# Patient Record
Sex: Male | Born: 1955
Health system: Southern US, Community
[De-identification: ages and names within clinical notes are randomized; demographics above are authoritative.]

## PROBLEM LIST (undated history)

## (undated) DIAGNOSIS — I10 Essential (primary) hypertension: Secondary | ICD-10-CM

## (undated) DIAGNOSIS — E119 Type 2 diabetes mellitus without complications: Secondary | ICD-10-CM

## (undated) HISTORY — PX: BRAIN SURGERY: SHX531

---

## 1998-02-25 ENCOUNTER — Inpatient Hospital Stay (HOSPITAL_COMMUNITY): Admission: EM | Admit: 1998-02-25 | Discharge: 1998-02-26 | Payer: Self-pay | Admitting: *Deleted

## 1999-12-15 ENCOUNTER — Emergency Department (HOSPITAL_COMMUNITY): Admission: EM | Admit: 1999-12-15 | Discharge: 1999-12-15 | Payer: Self-pay | Admitting: Emergency Medicine

## 2000-01-26 ENCOUNTER — Encounter: Payer: Self-pay | Admitting: Emergency Medicine

## 2000-01-26 ENCOUNTER — Emergency Department (HOSPITAL_COMMUNITY): Admission: EM | Admit: 2000-01-26 | Discharge: 2000-01-26 | Payer: Self-pay | Admitting: Emergency Medicine

## 2000-01-27 ENCOUNTER — Encounter: Payer: Self-pay | Admitting: Emergency Medicine

## 2000-02-20 ENCOUNTER — Encounter: Admission: RE | Admit: 2000-02-20 | Discharge: 2000-02-20 | Payer: Self-pay | Admitting: Internal Medicine

## 2006-02-22 ENCOUNTER — Inpatient Hospital Stay (HOSPITAL_COMMUNITY): Admission: EM | Admit: 2006-02-22 | Discharge: 2006-03-09 | Payer: Self-pay | Admitting: Emergency Medicine

## 2006-02-22 ENCOUNTER — Ambulatory Visit: Payer: Self-pay | Admitting: Acute Care

## 2006-03-01 ENCOUNTER — Ambulatory Visit: Payer: Self-pay | Admitting: Physical Medicine & Rehabilitation

## 2006-03-19 ENCOUNTER — Encounter: Admission: RE | Admit: 2006-03-19 | Discharge: 2006-03-19 | Payer: Self-pay | Admitting: Neurological Surgery

## 2009-09-17 ENCOUNTER — Encounter: Admission: RE | Admit: 2009-09-17 | Discharge: 2009-09-17 | Payer: Self-pay | Admitting: Family Medicine

## 2010-06-15 ENCOUNTER — Emergency Department (HOSPITAL_COMMUNITY): Admission: EM | Admit: 2010-06-15 | Discharge: 2010-06-16 | Payer: Self-pay | Admitting: Emergency Medicine

## 2010-10-25 LAB — DIFFERENTIAL
Basophils Absolute: 0 K/uL (ref 0.0–0.1)
Basophils Relative: 0 % (ref 0–1)
Eosinophils Absolute: 0.2 K/uL (ref 0.0–0.7)
Eosinophils Relative: 2 % (ref 0–5)
Lymphocytes Relative: 29 % (ref 12–46)
Lymphs Abs: 2.5 K/uL (ref 0.7–4.0)
Monocytes Absolute: 0.7 K/uL (ref 0.1–1.0)
Monocytes Relative: 8 % (ref 3–12)
Neutro Abs: 5.2 K/uL (ref 1.7–7.7)
Neutrophils Relative %: 61 % (ref 43–77)

## 2010-10-25 LAB — COMPREHENSIVE METABOLIC PANEL WITH GFR
ALT: 38 U/L (ref 0–53)
AST: 30 U/L (ref 0–37)
Albumin: 4 g/dL (ref 3.5–5.2)
Alkaline Phosphatase: 37 U/L — ABNORMAL LOW (ref 39–117)
BUN: 15 mg/dL (ref 6–23)
CO2: 26 meq/L (ref 19–32)
Calcium: 9.8 mg/dL (ref 8.4–10.5)
Chloride: 102 meq/L (ref 96–112)
Creatinine, Ser: 1.08 mg/dL (ref 0.4–1.5)
GFR calc Af Amer: >60 mL/min (ref >60)
GFR calc non Af Amer: >60 mL/min (ref >60)
Glucose, Bld: 117 mg/dL — ABNORMAL HIGH (ref 70–99)
Potassium: 3.8 meq/L (ref 3.5–5.1)
Sodium: 136 meq/L (ref 135–145)
Total Bilirubin: 0.3 mg/dL (ref 0.3–1.2)
Total Protein: 7.7 g/dL (ref 6.0–8.3)

## 2010-10-25 LAB — URINALYSIS, ROUTINE W REFLEX MICROSCOPIC
Bilirubin Urine: NEGATIVE
Glucose, UA: NEGATIVE mg/dL
Nitrite: NEGATIVE
Specific Gravity, Urine: 1.022 (ref 1.005–1.030)
pH: 5.5 (ref 5.0–8.0)

## 2010-10-25 LAB — CBC
HCT: 39.9 % (ref 39.0–52.0)
Hemoglobin: 14.2 g/dL (ref 13.0–17.0)
MCH: 28.6 pg (ref 26.0–34.0)
MCHC: 35.6 g/dL (ref 30.0–36.0)
MCV: 80.4 fL (ref 78.0–100.0)
Platelets: 235 K/uL (ref 150–400)
RBC: 4.96 MIL/uL (ref 4.22–5.81)
RDW: 14.5 % (ref 11.5–15.5)
WBC: 8.6 K/uL (ref 4.0–10.5)

## 2010-10-25 LAB — LIPASE, BLOOD: Lipase: 26 U/L (ref 11–59)

## 2010-12-30 NOTE — Op Note (Signed)
Anthony Barnes, Anthony Barnes NO.:  [id number]   MEDICAL RECORD NO.:  [id number]          PATIENT TYPE:  INP   LOCATION:  2304                         FACILITY:  [facility]   PHYSICIAN:  Tia Alert, MD     DATE OF BIRTH:  1956-07-16   DATE OF PROCEDURE:  02/22/2006  DATE OF DISCHARGE:                                 OPERATIVE REPORT   PREOPERATIVE DIAGNOSIS:  Right femoral hemorrhage with hydrocephalus.   POSTOPERATIVE DIAGNOSIS:  Right femoral hemorrhage with hydrocephalus.   PROCEDURES:  1.  Placement of an external ventricular drain in the right frontal region      for hydrocephalus.  2.  Right suboccipital craniectomy for evacuation of right cerebellar      hematoma.   SURGEON:  Marikay Alar, MD   ANESTHESIA:  General endotracheal.   COMPLICATIONS:  None apparent.   INDICATIONS FOR PROCEDURE:  Anthony Barnes is a 55 year old black male who  presented to the emergency department with a large right cerebellar  hemorrhage.  He was hypertensive and was not compliant with his medications.  His blood pressure was 250/150 in the emergency department and he was placed  on Cardene drip to try to control this.  He was somnolent, but would arouse  and follow commands.  Again, a CT scan showed a very large right cerebellar  hematoma.  I recommended emergent evacuation of this.  The family understood  the risks, benefits and expected outcome and wished to proceed.   DESCRIPTION OF PROCEDURE:  The patient was taken to the operating room and  after induction of adequate generalized endotracheal anesthesia, he was  placed in a supine position on the ER stretcher.  His right frontal region  was shaved and then prepped with DuraPrep and draped in the usual sterile  fashion.  A small stab incision was made in the mid-pupillary line, right  behind the hairline on the right side and a twist-drill craniostomy was  performed.  The dura was punctured and then a ventriculostomy catheter  was  passed to the right frontal horn with good flow of pinkish CSF.  This was  then tunneled to the separate exit site and then connected to its distal  reservoir, sewn into place and an occlusive dressing was placed.  The  patient was then placed in three-point Mayfield headrest and rolled into the  prone position on chest rolls; all pressure points were padded.  His  suboccipital region on the right side was shaved and then prepped with  DuraPrep and then draped in the usual sterile fashion.  Ten milliliters of  local anesthesia was injected and then a right paramedian incision was made  and self-retaining retractors were placed.  I used Bovie cautery to dissect  down to find the suboccipital bone and I dissected the muscular tissue away  from this down to the level the foramen magnum and to the midline.  I then  placed 2 bur holes and widened these bur holes with a Kerrison punch to  perform a craniectomy in the right suboccipital region.  I opened the dura  in  a cruciate fashion.  I performed a corticectomy and then found a large  blood clot; this was removed with bipolar cautery and with suction.  The  brain then slackened.  I then spent considerable time drying the surgical  bed with bipolar cautery, then with irrigation, then with Surgifoam and then  once meticulous hemostasis was achieved, I lined the surgical bed with  Surgicel and a small piece of Gelfoam.  The dura was then closed with  interrupted 4-0 Nurolon sutures.  A piece of Duragen and then thrombin-  soaked Gelfoam was placed over the craniectomy site and then the wound was  closed in layers of #1 Vicryl in the muscle and fascia, 2-0 Vicryl in the  subcutaneous tissues, 3-0 Vicryl in the subcuticular tissues and Benzoin and  Steri-Strips on the skin.  A sterile dressing was placed.  The patient was  then rolled into the supine position and taken out of the three-point  Mayfield headrest.  His ventriculostomy drain was  working and had an ICP of  6-7.  The patient seems to tolerate the procedure well and was taken to the  ICU in critical condition.  At the end of the procedure, all sponge, needle  and instrument counts were correct.      Tia Alert, MD  Electronically Signed     DSJ/MEDQ  D:  02/23/2006  T:  02/23/2006  Job:  [id number]

## 2010-12-30 NOTE — Consult Note (Signed)
[name], Barnes NO.:  [id number]   MEDICAL RECORD NO.:  [id number]          PATIENT TYPE:  INP   LOCATION:  2304                         FACILITY:  [facility]   PHYSICIAN:  Bevelyn Buckles. Champey, M.D.DATE OF BIRTH:  Feb 11, 1956   DATE OF CONSULTATION:  DATE OF DISCHARGE:                                   CONSULTATION   REASON FOR CONSULTATION:  Code stroke.   REQUESTING PHYSICIAN:  Dr. Radford Pax.   HISTORY OF PRESENT ILLNESS:  Mr. Anthony Barnes is a 55 year old African American  male with past medical history of hypertension, who presents this evening  after acute onset of severe headache, nausea, and unsteadiness (falling to  the right).  The patient was in his normal state of health, and said this  started around 8:30 p.m.  The patient has slowly deteriorated in mental  status since.  Since the onset, the patient is becoming more lethargic.  He  is arousable, but very lethargic.  He does follow simple commands, such as  squeeze fingers or stick out tongue.  No further history is possible at this  point.   PAST MEDICAL HISTORY:  Positive for hypertension.   MEDICATIONS:  Medications are unknown.   FAMILY HISTORY:  Unknown.   SOCIAL HISTORY:  Patient lives with his wife.   REVIEW OF SYSTEMS:  Unable to assess at this time secondary to decrease in  mental status.   PHYSICAL EXAMINATION:  VITAL SIGNS:  Blood pressure is 159/151.  Pulse is  83.  Respirations 16.  O2 sat is 100%.  HEENT:  Normocephalic, atraumatic.  Patient is severely diaphoretic.  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  Pupils are equal, round, and  reactive to light.  NECK:  Is supple.  No carotid bruits.  HEART:  Is regular.  LUNGS:  Are clear.  ABDOMEN:  Abdomen is soft.  EXTREMITIES: Have good pulses.  NEUROLOGICAL EXAMINATION:  Patient is lethargic, arousable.  Does follow  some simple commands.  Cranial nerves:  Patient has bilateral  ophthalmoparesis.  Pupils are equal, round and  reactive to light.  The face  is symmetric.  Motor examination:  Patient is moving all 4 extremities.  He  seems to move the right extremities greater than the left.  Patient's  strength is 4 to 4+/5.  Tone is slightly decreased.  Patient has slight  drift bilaterally.  Sensation:  Patient has minimal withdrawal to stimuli in  all 4 extremities.  Reflexes are trace.  Cerebellar function and gait are  unable to be assessed at this time.   LABORATORIES:  WBC is 11.2, hemoglobin is 14.6, hematocrit is 43.9,  platelets 192.  CT of the head showed a large right cerebellar bleed with  effacement of the left ventricle and increasing edema.   IMPRESSION:  This 55 year old African American male with hypertension  presents with acute nausea, headache, unsteadiness.  When I examined him, he  has bilateral ophthalmoparesis with a large right cerebellar hemorrhage.  Patient needs urgent neurosurgical consultation.  Risk of increased ICP and  herniation for surgical evacuation.  We will start a Cardene drip for better  blood pressure control.  Patient will need to be intubated and  hyperventilated to decrease ICP.  Neurosurgery was consulted, and patient  brought directly to the emergency room for evacuation.  We will follow the  patient on stroke service in the a.m.  Please call with any further  questions.      Bevelyn Buckles. Nash Shearer, M.D.  Electronically Signed     DRC/MEDQ  D:  02/22/2006  T:  02/24/2006  Job:  [id number]

## 2010-12-30 NOTE — Discharge Summary (Signed)
Anthony Barnes, CYPRESS NO.:  [id number]   MEDICAL RECORD NO.:  [id number]          PATIENT TYPE:  INP   LOCATION:  3038                         FACILITY:  [facility]   PHYSICIAN:  Tia Alert, MD     DATE OF BIRTH:  03-Oct-1955   DATE OF ADMISSION:  02/22/2006  DATE OF DISCHARGE:  03/09/2006                                 DISCHARGE SUMMARY   ADMISSION DIAGNOSIS:  Right cerebellar hemorrhage secondary to hypertension.   PROCEDURE:  His right suboccipital craniectomy for evacuation of cerebellar  hemorrhage.   BRIEF HISTORY OF PRESENT ILLNESS:  Anthony Barnes is a 55 year old African  American male who was brought to emergency department with a poor mental  status.  He was a known hypertensive and had been noncompliant with his  medications.  His head CT showed a large right cerebellar hemisphere  subdural hematoma, and neurosurgical evaluation was requested. He was  somnolent but would wake up and follow commands and move all extremities,  and his gaze was disconjugate. I recommended emergent placement of external  ventricular drain and suboccipital craniectomy for evacuation of subdural  hematoma.  The family understood the risks, benefits, and expected outcome  of the procedure and wished to proceed.   HOSPITAL COURSE:  The patient was taken to operating room emergently on February 22, 2006, where he underwent placement of a right external ventricular drain  and also underwent a right suboccipital craniectomy for evacuation of  subdural hematoma.  The patient tolerated the procedure well and was taken  to recovery room and then to the neurosurgical ICU in critical condition.  For details of the operative procedure, please see that the dictated  operative note.  The patient's  blood pressure was 243/150 at admission with  a pulse of 110.  He was controlled with IV antihypertensive drip in the  neurosurgical ICU, and hospitalists were consulted to help control his  blood  pressure. His blood pressure was controlled with these IV medications.   On postop day #1, he had a followup head CT which showed nice evacuation of  the cerebellar hematoma with his fourth ventricle appearing to be open. His  external ventricular drain was at 15 cm of water pressure. His ICP remained  well controlled.  His postoperative day #1 scan showed no evidence of  hydrocephalus.   On February 24, 2006, he was still ventilator dependent. His hypertension was  well-controlled on his IV medications.  He was awake and alert, following  commands with all four extremities.  Therefore, we weaned his ventilator.  Repeat head CT on February 25, 2006, showed no change.  He was weaned and  extubated over the next couple of days as he became more awake and alert.  His ventriculostomy drain was raised to 20 cm on February 26, 2006, and had  minimal output. His ventriculostomy drain was then increased to 25 cm of  water on July 18. His mental status did not change.  He remained awake and  following commands. He was extubated on March 01, 2006, and looked quite  good. He was following  commands with all four extremities.  His grips were  equal.  He did have nystagmus.  His followup head CT was stable, and his  ventriculostomy drain was removed.  He was transferred to the step-down unit  for further care.  Here, physical and occupational therapy were ordered.  He  continued to make progress with his mental status.  His blood pressure was  still well controlled on oral medications with p.r.n. hydralazine as ordered  by the critical care management team. He was found to have a right upper  lobe infiltrate on chest x-ray and was started on Avelox which he received  for 7 days. He continued to progress with PT and OT. We felt like he was  likely stable for rehab placement.  However, he progressed to the point that  rehab felt that he was doing well enough to be discharged home with home  health PT and OT.   This was arranged, and he was discharged to home in  stable condition on March 09, 2006, with plans to follow with Dr. Yetta Barre at 1-  2 weeks.  Would also scheduled follow up with a primary care  physician regarding treatment of his hypertension. He was discharged home on  his oral antihypertensives as had been prescribed by the critical care  management team and hospitalists.   FINAL DIAGNOSES:  Hypertensive cerebellar hemorrhage status post craniectomy  for evacuation,      Tia Alert, MD  Electronically Signed     DSJ/MEDQ  D:  04/04/2006  T:  04/04/2006  Job:  [id number]
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
## 2011-12-19 DIAGNOSIS — I10 Essential (primary) hypertension: Secondary | ICD-10-CM | POA: Diagnosis not present

## 2012-12-30 DIAGNOSIS — I10 Essential (primary) hypertension: Secondary | ICD-10-CM | POA: Diagnosis not present

## 2012-12-30 DIAGNOSIS — Z1211 Encounter for screening for malignant neoplasm of colon: Secondary | ICD-10-CM | POA: Diagnosis not present

## 2012-12-30 DIAGNOSIS — IMO0001 Reserved for inherently not codable concepts without codable children: Secondary | ICD-10-CM | POA: Diagnosis not present

## 2013-01-13 DIAGNOSIS — IMO0001 Reserved for inherently not codable concepts without codable children: Secondary | ICD-10-CM | POA: Diagnosis not present

## 2013-01-13 DIAGNOSIS — I10 Essential (primary) hypertension: Secondary | ICD-10-CM | POA: Diagnosis not present

## 2013-03-20 ENCOUNTER — Encounter (HOSPITAL_COMMUNITY): Payer: Self-pay | Admitting: Cardiology

## 2013-03-20 ENCOUNTER — Emergency Department (HOSPITAL_COMMUNITY): Payer: Medicare Other

## 2013-03-20 ENCOUNTER — Observation Stay (HOSPITAL_COMMUNITY)
Admission: EM | Admit: 2013-03-20 | Discharge: 2013-03-23 | Disposition: A | Discharge status: Home / Self Care | Payer: Medicare Other | Attending: Internal Medicine | Admitting: Internal Medicine

## 2013-03-20 DIAGNOSIS — Z794 Long term (current) use of insulin: Secondary | ICD-10-CM | POA: Diagnosis not present

## 2013-03-20 DIAGNOSIS — Z91199 Patient's noncompliance with other medical treatment and regimen due to unspecified reason: Secondary | ICD-10-CM | POA: Insufficient documentation

## 2013-03-20 DIAGNOSIS — Z9119 Patient's noncompliance with other medical treatment and regimen: Secondary | ICD-10-CM | POA: Diagnosis not present

## 2013-03-20 DIAGNOSIS — Z23 Encounter for immunization: Secondary | ICD-10-CM | POA: Insufficient documentation

## 2013-03-20 DIAGNOSIS — R609 Edema, unspecified: Secondary | ICD-10-CM | POA: Insufficient documentation

## 2013-03-20 DIAGNOSIS — E119 Type 2 diabetes mellitus without complications: Secondary | ICD-10-CM | POA: Diagnosis not present

## 2013-03-20 DIAGNOSIS — K2960 Other gastritis without bleeding: Secondary | ICD-10-CM | POA: Diagnosis not present

## 2013-03-20 DIAGNOSIS — E871 Hypo-osmolality and hyponatremia: Secondary | ICD-10-CM | POA: Diagnosis not present

## 2013-03-20 DIAGNOSIS — I10 Essential (primary) hypertension: Secondary | ICD-10-CM | POA: Insufficient documentation

## 2013-03-20 DIAGNOSIS — R079 Chest pain, unspecified: Secondary | ICD-10-CM

## 2013-03-20 DIAGNOSIS — K2980 Duodenitis without bleeding: Secondary | ICD-10-CM | POA: Insufficient documentation

## 2013-03-20 DIAGNOSIS — Z79899 Other long term (current) drug therapy: Secondary | ICD-10-CM | POA: Diagnosis not present

## 2013-03-20 DIAGNOSIS — R1013 Epigastric pain: Secondary | ICD-10-CM | POA: Diagnosis not present

## 2013-03-20 HISTORY — DX: Essential (primary) hypertension: I10

## 2013-03-20 HISTORY — DX: Type 2 diabetes mellitus without complications: E11.9

## 2013-03-20 LAB — CBC
HCT: 42.4 % (ref 39.0–52.0)
MCV: 79.8 fL (ref 78.0–100.0)
Platelets: 230 10*3/uL (ref 150–400)
RDW: 14.4 % (ref 11.5–15.5)

## 2013-03-20 LAB — HEPATIC FUNCTION PANEL
ALT: 26 U/L (ref 0–53)
AST: 22 U/L (ref 0–37)
Albumin: 3.8 g/dL (ref 3.5–5.2)
Alkaline Phosphatase: 54 U/L (ref 39–117)
Total Protein: 8.7 g/dL — ABNORMAL HIGH (ref 6.0–8.3)

## 2013-03-20 LAB — BASIC METABOLIC PANEL
BUN: 13 mg/dL (ref 6–23)
Chloride: 92 mEq/L — ABNORMAL LOW (ref 96–112)
Creatinine, Ser: 1.02 mg/dL (ref 0.50–1.35)
GFR calc Af Amer: >90 mL/min (ref >90)

## 2013-03-20 MED ORDER — ONDANSETRON HCL 4 MG/2ML IJ SOLN
4.0000 mg | Freq: Once | INTRAMUSCULAR | Status: AC
  Administered 2013-03-20: 4 mg via INTRAVENOUS
  Filled 2013-03-20: qty 2

## 2013-03-20 MED ORDER — MORPHINE SULFATE 4 MG/ML IJ SOLN
4.0000 mg | Freq: Once | INTRAMUSCULAR | Status: AC
  Administered 2013-03-20: 4 mg via INTRAVENOUS
  Filled 2013-03-20: qty 1

## 2013-03-20 MED ORDER — ACETAMINOPHEN 325 MG PO TABS
650.0000 mg | ORAL_TABLET | ORAL | Status: DC | PRN
  Administered 2013-03-21 – 2013-03-22 (×2): 650 mg via ORAL
  Filled 2013-03-20 (×4): qty 2

## 2013-03-20 MED ORDER — ASPIRIN 325 MG PO TABS
325.0000 mg | ORAL_TABLET | Freq: Every day | ORAL | Status: DC
  Administered 2013-03-20 – 2013-03-23 (×3): 325 mg via ORAL
  Filled 2013-03-20 (×4): qty 1

## 2013-03-20 MED ORDER — TRIAMTERENE-HCTZ 75-50 MG PO TABS
1.0000 | ORAL_TABLET | Freq: Every day | ORAL | Status: DC
  Administered 2013-03-21: 1 via ORAL
  Filled 2013-03-20 (×2): qty 1

## 2013-03-20 MED ORDER — ASPIRIN 81 MG PO CHEW
324.0000 mg | CHEWABLE_TABLET | Freq: Once | ORAL | Status: AC
  Administered 2013-03-21: 324 mg via ORAL
  Filled 2013-03-20: qty 4

## 2013-03-20 MED ORDER — NITROGLYCERIN 0.4 MG SL SUBL
0.4000 mg | SUBLINGUAL_TABLET | SUBLINGUAL | Status: DC | PRN
  Administered 2013-03-20: 0.4 mg via SUBLINGUAL

## 2013-03-20 MED ORDER — CLONIDINE HCL 0.3 MG PO TABS
0.3000 mg | ORAL_TABLET | Freq: Two times a day (BID) | ORAL | Status: DC
  Administered 2013-03-20 – 2013-03-23 (×6): 0.3 mg via ORAL
  Filled 2013-03-20 (×7): qty 1

## 2013-03-20 MED ORDER — AMLODIPINE BESYLATE 10 MG PO TABS
10.0000 mg | ORAL_TABLET | Freq: Every day | ORAL | Status: DC
  Administered 2013-03-21 – 2013-03-22 (×3): 10 mg via ORAL
  Filled 2013-03-20 (×3): qty 1

## 2013-03-20 MED ORDER — ONDANSETRON HCL 4 MG/2ML IJ SOLN
4.0000 mg | Freq: Four times a day (QID) | INTRAMUSCULAR | Status: DC | PRN
  Administered 2013-03-21: 4 mg via INTRAVENOUS
  Filled 2013-03-20: qty 2

## 2013-03-20 MED ORDER — MORPHINE SULFATE 4 MG/ML IJ SOLN
4.0000 mg | Freq: Once | INTRAMUSCULAR | Status: AC
  Administered 2013-03-21: 4 mg via INTRAVENOUS
  Filled 2013-03-20: qty 1

## 2013-03-20 MED ORDER — METOPROLOL TARTRATE 50 MG PO TABS
50.0000 mg | ORAL_TABLET | Freq: Two times a day (BID) | ORAL | Status: DC
  Administered 2013-03-20 – 2013-03-22 (×4): 50 mg via ORAL
  Filled 2013-03-20: qty 2
  Filled 2013-03-20 (×4): qty 1

## 2013-03-20 MED ORDER — LISINOPRIL 20 MG PO TABS
20.0000 mg | ORAL_TABLET | Freq: Two times a day (BID) | ORAL | Status: DC
  Administered 2013-03-20 – 2013-03-22 (×4): 20 mg via ORAL
  Filled 2013-03-20 (×5): qty 1

## 2013-03-20 MED ORDER — GI COCKTAIL ~~LOC~~
30.0000 mL | Freq: Once | ORAL | Status: AC
  Administered 2013-03-20: 30 mL via ORAL
  Filled 2013-03-20: qty 30

## 2013-03-20 MED ORDER — GI COCKTAIL ~~LOC~~
30.0000 mL | Freq: Once | ORAL | Status: AC
  Administered 2013-03-21: 30 mL via ORAL
  Filled 2013-03-20: qty 30

## 2013-03-20 MED ORDER — FAMOTIDINE IN NACL 20-0.9 MG/50ML-% IV SOLN
20.0000 mg | Freq: Once | INTRAVENOUS | Status: AC
  Administered 2013-03-21: 20 mg via INTRAVENOUS
  Filled 2013-03-20: qty 50

## 2013-03-20 MED ORDER — PANTOPRAZOLE SODIUM 40 MG PO TBEC
40.0000 mg | DELAYED_RELEASE_TABLET | Freq: Every day | ORAL | Status: DC
  Administered 2013-03-20 – 2013-03-21 (×2): 40 mg via ORAL
  Filled 2013-03-20 (×2): qty 1

## 2013-03-20 MED ORDER — GI COCKTAIL ~~LOC~~
30.0000 mL | Freq: Four times a day (QID) | ORAL | Status: DC | PRN
  Administered 2013-03-21 (×2): 30 mL via ORAL
  Filled 2013-03-20 (×2): qty 30

## 2013-03-20 NOTE — ED Provider Notes (Signed)
CSN: [id number]     Arrival date & time 03/20/13  1533 History     First MD Initiated Contact with Patient 03/20/13 1739     Chief Complaint  Patient presents with  . Chest Pain   (Consider location/radiation/quality/duration/timing/severity/associated sxs/prior Treatment) HPI Comments: Patient with h/o HTN, DM not currently on medication, obesity, h/o cerebral hemorrhage -- presents with epigastric pain, non-radiating x 3 days. Starts at rest. It has been associated with nausea. No shortness of breath or diaphoresis. No palpitations. No pain in the upper part of his chest. No fever, vomiting. No treatments prior to arrival. Patient does take an aspirin every day. Started today after eating cereal. The onset of this condition was acute. The course is constant. Aggravating factors: none. Alleviating factors: none.    Patient is a 57 y.o. male presenting with chest pain. The history is provided by the patient and medical records.  Chest Pain Associated symptoms: abdominal pain   Associated symptoms: no back pain, no cough, no diaphoresis, no fever, no nausea, no palpitations, no shortness of breath and not vomiting     Past Medical History  Diagnosis Date  . Hypertension   . Diabetes mellitus without complication    History reviewed. No pertinent past surgical history. History reviewed. No pertinent family history. History  Substance Use Topics  . Smoking status: Never Smoker   . Smokeless tobacco: Not on file  . Alcohol Use: Yes    Review of Systems  Constitutional: Negative for fever and diaphoresis.  HENT: Negative for neck pain.   Eyes: Negative for redness.  Respiratory: Negative for cough and shortness of breath.   Cardiovascular: Positive for chest pain. Negative for palpitations and leg swelling.  Gastrointestinal: Positive for abdominal pain. Negative for nausea and vomiting.  Genitourinary: Negative for dysuria.  Musculoskeletal: Negative for back pain.  Skin:  Negative for rash.  Neurological: Negative for syncope and light-headedness.    Allergies  Shellfish allergy and Codeine  Home Medications   Current Outpatient Rx  Name  Route  Sig  Dispense  Refill  . amLODipine (NORVASC) 10 MG tablet   Oral   Take 10 mg by mouth daily.         Marland Kitchen aspirin 325 MG tablet   Oral   Take 325 mg by mouth daily.          . cloNIDine (CATAPRES) 0.3 MG tablet   Oral   Take 0.3 mg by mouth 2 (two) times daily.          Marland Kitchen lisinopril (PRINIVIL,ZESTRIL) 20 MG tablet   Oral   Take 20 mg by mouth 2 (two) times daily.         . metoprolol (LOPRESSOR) 50 MG tablet   Oral   Take 50 mg by mouth 2 (two) times daily.         . Multiple Vitamin (MULTIVITAMIN WITH MINERALS) TABS tablet   Oral   Take 1 tablet by mouth daily.         Marland Kitchen OVER THE COUNTER MEDICATION   Oral   Take 3 tablets by mouth every other day. "Nugenix" testosterone supplement         . triamterene-hydrochlorothiazide (MAXZIDE) 75-50 MG per tablet   Oral   Take 1 tablet by mouth daily.          BP 150/92  Pulse 83  Temp(Src) 98 F (36.7 C) (Oral)  Resp 18  Ht 6\' 4"  (1.93 m)  Wt 329 lb (  149.233 kg)  BMI 40.06 kg/m2  SpO2 96%  Physical Exam  Nursing note and vitals reviewed. Constitutional: He appears well-developed and well-nourished.  HENT:  Head: Normocephalic and atraumatic.  Mouth/Throat: Mucous membranes are normal. Mucous membranes are not dry.  Eyes: Conjunctivae are normal.  Neck: Trachea normal and normal range of motion. Neck supple. Normal carotid pulses and no JVD present. No muscular tenderness present. Carotid bruit is not present. No tracheal deviation present.  Cardiovascular: Normal rate, regular rhythm, S1 normal, S2 normal, normal heart sounds and intact distal pulses.  Exam reveals no distant heart sounds and no decreased pulses.   No murmur heard. Pulmonary/Chest: Effort normal and breath sounds normal. No respiratory distress. He has no  wheezes. He exhibits no tenderness.  Abdominal: Soft. Normal aorta and bowel sounds are normal. There is tenderness in the epigastric area. There is no rigidity, no rebound, no guarding, no CVA tenderness, no tenderness at McBurney's point and negative Murphy's sign.  Musculoskeletal: He exhibits no edema.  Neurological: He is alert.  Skin: Skin is warm and dry. He is not diaphoretic. No cyanosis. No pallor.  Psychiatric: He has a normal mood and affect.    ED Course   Procedures (including critical care time)  Labs Reviewed  CBC - Abnormal; Notable for the following:    WBC 12.9 (*)    MCHC 37.0 (*)    All other components within normal limits  BASIC METABOLIC PANEL - Abnormal; Notable for the following:    Sodium 128 (*)    Chloride 92 (*)    Glucose, Bld 186 (*)    GFR calc non Af Amer 81 (*)    All other components within normal limits  HEPATIC FUNCTION PANEL - Abnormal; Notable for the following:    Total Protein 8.7 (*)    All other components within normal limits  LIPASE, BLOOD  POCT I-STAT TROPONIN I   Dg Chest 2 View  03/20/2013   *RADIOLOGY REPORT*  Clinical Data: The chest pain  CHEST - 2 VIEW  Comparison: Prior chest x-ray 03/01/2006  Findings: Low inspiratory volumes.  Negative for focal airspace consolidation, pleural effusion, pneumothorax or pulmonary edema. Cardiomegaly with left ventricular prominence appears similar compared to prior imaging.  No acute osseous abnormality. Multilevel degenerative changes throughout the spine.  IMPRESSION: Stable appearance of the low lung volumes.  Otherwise, no acute cardiopulmonary disease.   Original Report Authenticated By: Malachy Moan, M.D.   1. Chest pain     6:57 PM Patient seen and examined. Work-up initiated. Medications ordered.   Vital signs reviewed and are as follows: Filed Vitals:   03/20/13 1844  BP: 150/92  Pulse: 83  Temp:   Resp:   BP 150/92  Pulse 83  Temp(Src) 98 F (36.7 C) (Oral)  Resp 18   Ht 6\' 4"  (1.93 m)  Wt 329 lb (149.233 kg)  BMI 40.06 kg/m2  SpO2 96%  7:05 PM D/w Dr. Karma Ganja.    Date: 03/20/2013  Rate: 86  Rhythm: normal sinus rhythm  QRS Axis: left  Intervals: normal  ST/T Wave abnormalities: normal  Conduction Disutrbances:none  Narrative Interpretation:   Old EKG Reviewed: changes noted from 02/22/06, improvement in lateral biphasic t-waves  8:13 PM Spoke with Dr. Julian Reil who will admit.   MDM  Admit with CP in setting of multiple risk factors including family history in sibling.    Renne Crigler, PA-C 03/20/13 2015

## 2013-03-20 NOTE — Significant Event (Signed)
Epigastric pain is back, and patient IS having ST segment depression on EKG per nursing, so may end up needing cardiac rule out and cards consult after all.  Have asked them to give NTG and GI cocktail and see which of the two if either improves his pain.

## 2013-03-20 NOTE — ED Notes (Signed)
Pt reports epigastric pain that started on Tuesday. States that he was recently dx with diverticulitis but not placing on antibiotics. States that he has felt nauseated but no vomiting. Denies any SOB.

## 2013-03-20 NOTE — H&P (Signed)
[facility] History and Physical  [name] ZOX:[id number] DOB: 24-Sep-1955 DOA: 03/20/2013  Referring physician: ED PCP: Mariea Clonts, NP   Chief Complaint: Epigastric pain  HPI: Anthony Barnes is a 57 y.o. male who presents to the ED with c/o pain.  The pain has been going on for about 3 days, located in his epigastrium without radiation.  Starts at rest, worse with eating, and has been associated with nausea.  No SOB no DOE, no palpitations, no L sided radiation of the pain, does have some L arm pain last night but none tonight.  Pain got "a little" better with GI cocktail, pain was unchanged with NTG.  Work up in ED including troponin was negative, HEART score is 3 but ED is very concerned about chest pain and wants patient admitted.  Review of Systems: 12 systems reviewed and otherwise negative.  Past Medical History  Diagnosis Date  . Hypertension   . Diabetes mellitus without complication    History reviewed. No pertinent past surgical history. Social History:  reports that he has never smoked. He does not have any smokeless tobacco history on file. He reports that  drinks alcohol. He reports that he does not use illicit drugs.   Allergies  Allergen Reactions  . Shellfish Allergy Anaphylaxis  . Codeine Itching    unknown    History reviewed. No pertinent family history.  Prior to Admission medications   Medication Sig Start Date End Date Taking? Authorizing Provider  amLODipine (NORVASC) 10 MG tablet Take 10 mg by mouth daily.   Yes Historical Provider, MD  aspirin 325 MG tablet Take 325 mg by mouth daily.    Yes Historical Provider, MD  cloNIDine (CATAPRES) 0.3 MG tablet Take 0.3 mg by mouth 2 (two) times daily.    Yes Historical Provider, MD  lisinopril (PRINIVIL,ZESTRIL) 20 MG tablet Take 20 mg by mouth 2 (two) times daily.   Yes Historical Provider, MD  metoprolol (LOPRESSOR) 50 MG tablet Take 50 mg by mouth 2 (two) times daily.   Yes  Historical Provider, MD  Multiple Vitamin (MULTIVITAMIN WITH MINERALS) TABS tablet Take 1 tablet by mouth daily.   Yes Historical Provider, MD  OVER THE COUNTER MEDICATION Take 3 tablets by mouth every other day. "Nugenix" testosterone supplement   Yes Historical Provider, MD  triamterene-hydrochlorothiazide (MAXZIDE) 75-50 MG per tablet Take 1 tablet by mouth daily.   Yes Historical Provider, MD   Physical Exam: Filed Vitals:   03/20/13 2015  BP: 169/93  Pulse: 81  Temp:   Resp: 18    General:  NAD, resting comfortably in bed Eyes: PEERLA EOMI ENT: mucous membranes moist Neck: supple w/o JVD Cardiovascular: RRR w/o MRG Respiratory: CTA B Abdomen: soft, mild epigastric TTP, says this reproduces his pain exactly, nd, bs+ Skin: no rash nor lesion Musculoskeletal: MAE, full ROM all 4 extremities Psychiatric: normal tone and affect Neurologic: AAOx3, grossly non-focal  Labs on Admission:  Basic Metabolic Panel:  Recent Labs Lab 03/20/13 1627  NA 128*  K 4.1  CL 92*  CO2 21  GLUCOSE 186*  BUN 13  CREATININE 1.02  CALCIUM 10.0   Liver Function Tests:  Recent Labs Lab 03/20/13 1627  AST 22  ALT 26  ALKPHOS 54  BILITOT 0.4  PROT 8.7*  ALBUMIN 3.8    Recent Labs Lab 03/20/13 1627  LIPASE 43   No results found for this basename: AMMONIA,  in the last 168 hours CBC:  Recent Labs Lab  03/20/13 1627  WBC 12.9*  HGB 15.7  HCT 42.4  MCV 79.8  PLT 230   Cardiac Enzymes: No results found for this basename: CKTOTAL, CKMB, CKMBINDEX, TROPONINI,  in the last 168 hours  BNP (last 3 results) No results found for this basename: PROBNP,  in the last 8760 hours CBG: No results found for this basename: GLUCAP,  in the last 168 hours  Radiological Exams on Admission: Dg Chest 2 View  03/20/2013   *RADIOLOGY REPORT*  Clinical Data: The chest pain  CHEST - 2 VIEW  Comparison: Prior chest x-ray 03/01/2006  Findings: Low inspiratory volumes.  Negative for focal  airspace consolidation, pleural effusion, pneumothorax or pulmonary edema. Cardiomegaly with left ventricular prominence appears similar compared to prior imaging.  No acute osseous abnormality. Multilevel degenerative changes throughout the spine.  IMPRESSION: Stable appearance of the low lung volumes.  Otherwise, no acute cardiopulmonary disease.   Original Report Authenticated By: Malachy Moan, M.D.    EKG: Independently reviewed.  Assessment/Plan Principal Problem:   Epigastric pain Active Problems:   HTN (hypertension)   1. Epigastric pain - checking serial troponins but doubt that this represents CAD, HEART score is 3 at this point, NPO after midnight just in case he develops some changes that would make cards want to do IP stress test but at this time seems unlikely that cards will want to do so.  GI cocktails and PPI ordered. 2. DM - q6h CBG checks, not on home meds 3. HTN - continue home meds    Code Status: Full (must indicate code status--if unknown or must be presumed, indicate so) Family Communication: No family in room (indicate person spoken with, if applicable, with phone number if by telephone) Disposition Plan: Admit to obs (indicate anticipated LOS)  Time spent: 70 min  Lakie Mclouth M. [facility] Pager [phone]  If 7PM-7AM, please contact night-coverage [email or web address] Password [facility] 03/20/2013, 10:07 PM

## 2013-03-20 NOTE — ED Notes (Signed)
Pt reporting mid epigastric chest pain.  EDPA made aware.  Report EKG ordered.

## 2013-03-20 NOTE — ED Provider Notes (Signed)
Medical screening examination/treatment/procedure(s) were performed by non-physician practitioner and as supervising physician I was immediately available for consultation/collaboration.  Ethelda Chick, MD 03/20/13 2025

## 2013-03-21 ENCOUNTER — Observation Stay (HOSPITAL_COMMUNITY): Payer: Medicare Other

## 2013-03-21 DIAGNOSIS — M7989 Other specified soft tissue disorders: Secondary | ICD-10-CM | POA: Diagnosis not present

## 2013-03-21 DIAGNOSIS — E119 Type 2 diabetes mellitus without complications: Secondary | ICD-10-CM

## 2013-03-21 DIAGNOSIS — E871 Hypo-osmolality and hyponatremia: Secondary | ICD-10-CM | POA: Diagnosis not present

## 2013-03-21 DIAGNOSIS — R109 Unspecified abdominal pain: Secondary | ICD-10-CM | POA: Diagnosis not present

## 2013-03-21 DIAGNOSIS — I10 Essential (primary) hypertension: Secondary | ICD-10-CM

## 2013-03-21 DIAGNOSIS — M79609 Pain in unspecified limb: Secondary | ICD-10-CM | POA: Diagnosis not present

## 2013-03-21 DIAGNOSIS — R1013 Epigastric pain: Secondary | ICD-10-CM | POA: Diagnosis not present

## 2013-03-21 DIAGNOSIS — R933 Abnormal findings on diagnostic imaging of other parts of digestive tract: Secondary | ICD-10-CM | POA: Diagnosis not present

## 2013-03-21 LAB — TROPONIN I
Troponin I: <0.3 ng/mL (ref <0.30)
Troponin I: <0.3 ng/mL (ref <0.30)

## 2013-03-21 LAB — RAPID URINE DRUG SCREEN, HOSP PERFORMED
Barbiturates: POSITIVE — AB
Cocaine: NOT DETECTED

## 2013-03-21 LAB — TSH: TSH: 1.708 u[IU]/mL (ref 0.350–4.500)

## 2013-03-21 LAB — URINALYSIS W MICROSCOPIC + REFLEX CULTURE
Glucose, UA: 100 mg/dL — AB
Leukocytes, UA: NEGATIVE
Protein, ur: NEGATIVE mg/dL
Specific Gravity, Urine: 1.015 (ref 1.005–1.030)

## 2013-03-21 LAB — GLUCOSE, CAPILLARY

## 2013-03-21 LAB — BASIC METABOLIC PANEL
BUN: 11 mg/dL (ref 6–23)
GFR calc Af Amer: >90 mL/min (ref >90)
GFR calc non Af Amer: 82 mL/min — ABNORMAL LOW (ref >90)
Potassium: 4 mEq/L (ref 3.5–5.1)
Sodium: 127 mEq/L — ABNORMAL LOW (ref 135–145)

## 2013-03-21 LAB — HEMOGLOBIN A1C: Hgb A1c MFr Bld: 7.7 % — ABNORMAL HIGH (ref <5.7)

## 2013-03-21 LAB — CBC
Hemoglobin: 14.8 g/dL (ref 13.0–17.0)
MCHC: 37 g/dL — ABNORMAL HIGH (ref 30.0–36.0)
RBC: 5.09 MIL/uL (ref 4.22–5.81)
WBC: 12.9 10*3/uL — ABNORMAL HIGH (ref 4.0–10.5)

## 2013-03-21 MED ORDER — INSULIN ASPART 100 UNIT/ML ~~LOC~~ SOLN: 0.0000 [IU] | Freq: Every day | SUBCUTANEOUS | Status: DC

## 2013-03-21 MED ORDER — INSULIN ASPART 100 UNIT/ML ~~LOC~~ SOLN
0.0000 [IU] | Freq: Three times a day (TID) | SUBCUTANEOUS | Status: DC
  Administered 2013-03-21: 2 [IU] via SUBCUTANEOUS
  Administered 2013-03-21: 3 [IU] via SUBCUTANEOUS
  Administered 2013-03-22 (×2): 2 [IU] via SUBCUTANEOUS
  Administered 2013-03-22: 1 [IU] via SUBCUTANEOUS
  Administered 2013-03-23: 2 [IU] via SUBCUTANEOUS
  Administered 2013-03-23: 3 [IU] via SUBCUTANEOUS

## 2013-03-21 MED ORDER — IOHEXOL 300 MG/ML  SOLN
25.0000 mL | INTRAMUSCULAR | Status: AC
  Administered 2013-03-21 (×2): 25 mL via ORAL

## 2013-03-21 MED ORDER — ENOXAPARIN SODIUM 40 MG/0.4ML ~~LOC~~ SOLN
40.0000 mg | SUBCUTANEOUS | Status: DC
  Administered 2013-03-21 – 2013-03-23 (×3): 40 mg via SUBCUTANEOUS
  Filled 2013-03-21 (×3): qty 0.4

## 2013-03-21 MED ORDER — IOHEXOL 300 MG/ML  SOLN
100.0000 mL | Freq: Once | INTRAMUSCULAR | Status: AC | PRN
  Administered 2013-03-21: 100 mL via INTRAVENOUS

## 2013-03-21 MED ORDER — MORPHINE SULFATE 2 MG/ML IJ SOLN
2.0000 mg | INTRAMUSCULAR | Status: DC | PRN
  Administered 2013-03-21 – 2013-03-22 (×4): 2 mg via INTRAVENOUS
  Filled 2013-03-21 (×4): qty 1

## 2013-03-21 MED ORDER — SIMETHICONE 80 MG PO CHEW
80.0000 mg | CHEWABLE_TABLET | Freq: Four times a day (QID) | ORAL | Status: DC | PRN
  Administered 2013-03-21 – 2013-03-22 (×2): 80 mg via ORAL
  Filled 2013-03-21 (×3): qty 1

## 2013-03-21 MED ORDER — PANTOPRAZOLE SODIUM 40 MG PO TBEC
40.0000 mg | DELAYED_RELEASE_TABLET | Freq: Two times a day (BID) | ORAL | Status: DC
  Administered 2013-03-21 – 2013-03-23 (×4): 40 mg via ORAL
  Filled 2013-03-21 (×3): qty 1

## 2013-03-21 NOTE — Progress Notes (Signed)
*  Preliminary Results* Bilateral lower extremity venous duplex completed. Bilateral lower extremities are negative for deep vein thrombosis. There is no evidence of Baker's cyst bilaterally.  03/21/2013  Gertie Fey, RVT, RDCS, RDMS

## 2013-03-21 NOTE — Progress Notes (Signed)
[facility] PROGRESS NOTE  Anthony Barnes RUE:[id number] DOB: 08/10/1956 DOA: 03/20/2013 PCP: Mariea Clonts, NP  Assessment/Plan: Epigastric/abdominal pain -Etiology unclear, workup in progress -CT abdomen pelvis -Hepatic enzymes, lipase normal -Urinalysis, urine drug screen -cycle troponins -EKG shows nonspecific ST-T wave changes, likely due to LVH -morphine when necessary pain Leukocytosis -Patient remains afebrile and hemodynamically stable -Urinalysis with reflex urine culture -Continue monitor Hypertension -Continue home dose of amlodipine, Catapres, metoprolol tartrate,Lisinopril -Discontinue Maxzide at this time due to worsen hyponatremia Diabetes mellitus type 2 -Patient is noncompliant with medications -Check hemoglobin A1c -NovoLog sliding scale History of subarachnoid hemorrhage -Related to uncontrolled hypertension in 2007 -Optimize blood pressure control Lower extremity edema and pain -Venous duplex rule out DVT Hyponatremia -Likely due to the patient's diuretic -Discontinue Maxzide -Check TSH  Family Communication:   wife at beside Disposition Plan:   Home when medically stable        Procedures/Studies: Dg Chest 2 View  03/20/2013   *RADIOLOGY REPORT*  Clinical Data: The chest pain  CHEST - 2 VIEW  Comparison: Prior chest x-ray 03/01/2006  Findings: Low inspiratory volumes.  Negative for focal airspace consolidation, pleural effusion, pneumothorax or pulmonary edema. Cardiomegaly with left ventricular prominence appears similar compared to prior imaging.  No acute osseous abnormality. Multilevel degenerative changes throughout the spine.  IMPRESSION: Stable appearance of the low lung volumes.  Otherwise, no acute cardiopulmonary disease.   Original Report Authenticated By: Malachy Moan, M.D.         Subjective: Patient continues to complain of epigastric pain that has been constant for the past 3 days. Describes it as a burning,  knowing sensation. Denies any dizziness, chest discomfort, shortness breath, palpitations, vomiting, diarrhea, hematochezia, melena, dysuria. Continues to have nausea.  Objective: Filed Vitals:   03/20/13 2145 03/21/13 0023 03/21/13 0141 03/21/13 0537  BP: 155/71 147/86 132/105 124/77  Pulse: 83 89 106 74  Temp:   98.2 F (36.8 C) 98.2 F (36.8 C)  TempSrc:   Oral Oral  Resp: 21  20 18   Height:   6\' 4"  (1.93 m)   Weight:   144.834 kg (319 lb 4.8 oz)   SpO2: 94% 95% 97% 100%    Intake/Output Summary (Last 24 hours) at 03/21/13 0853 Last data filed at 03/21/13 1478  Gross per 24 hour  Intake      0 ml  Output    275 ml  Net   -275 ml   Weight change:  Exam:   General:  Pt is alert, follows commands appropriately, not in acute distress  HEENT: No icterus, No thrush, No neck mass, [place]/AT  Cardiovascular: RRR, S1/S2, no rubs, no gallops  Respiratory: CTA bilaterally, no wheezing, no crackles, no rhonchi  Abdomen: Soft/+BS, epigastric tenderness without peritoneal signs, non distended, no guarding  Extremities: right greater than left lower extremity edema, No lymphangitis, No petechiae, No rashes, no synovitis  Data Reviewed: Basic Metabolic Panel:  Recent Labs Lab 03/20/13 1627  NA 128*  K 4.1  CL 92*  CO2 21  GLUCOSE 186*  BUN 13  CREATININE 1.02  CALCIUM 10.0   Liver Function Tests:  Recent Labs Lab 03/20/13 1627  AST 22  ALT 26  ALKPHOS 54  BILITOT 0.4  PROT 8.7*  ALBUMIN 3.8    Recent Labs Lab 03/20/13 1627  LIPASE 43   No results found for this basename: AMMONIA,  in the last 168 hours CBC:  Recent Labs Lab 03/20/13 1627  WBC 12.9*  HGB  15.7  HCT 42.4  MCV 79.8  PLT 230   Cardiac Enzymes: No results found for this basename: CKTOTAL, CKMB, CKMBINDEX, TROPONINI,  in the last 168 hours BNP: No components found with this basename: POCBNP,  CBG:  Recent Labs Lab 03/21/13 0103 03/21/13 0620  GLUCAP 210* 188*    No results  found for this or any previous visit (from the past 240 hour(s)).   Scheduled Meds: . amLODipine  10 mg Oral Daily  . aspirin  325 mg Oral Daily  . cloNIDine  0.3 mg Oral BID  . enoxaparin (LOVENOX) injection  40 mg Subcutaneous Q24H  . lisinopril  20 mg Oral BID  . metoprolol  50 mg Oral BID  . pantoprazole  40 mg Oral Daily   Continuous Infusions:    Issam Carlyon, DO  [facility] Pager [phone]  If 7PM-7AM, please contact night-coverage [email or web address] Password [facility] 03/21/2013, 8:53 AM   LOS: 1 day

## 2013-03-21 NOTE — Consult Note (Signed)
[facility] Reason for consult: epigastric pain, abnormal CT Referring Physician: [facility]. PCP: Byrd Hesselbach, NP  Anthony Barnes is an 57 y.o. male.  HPI: patient was in his usual state of health until 3 days ago. He was working with his brothers doing Market researcher work. He had sudden onset of epigastric pain. There was no associated nausea. The pain was not clearly worse initially with eating until yesterday morning when it became quite severe after eating a bowl of cereal. Valuation the emergency room showed no evidence of cardiac disease. Patient had a CT scan of US abdomen revealed unremarkable gallbladder without ductal dilatation with Loraine Leriche inflammatory changes along uncinate  process of the pancreas consistent with focal pancreatitis. The patient's lipase and liver tests were normal. He's never had biliary tract disease and no one in his family has had biliary tract disease. The possibility of an ulcer rate in the pancreas was brought up by the CT scan. The patient does have heartburn occasionally but it is not been very severe. He has never had EGD, or never had prior ulcers. This history is remarkable for hypertension and diabetes. There is no history of prior surgery. He reports that his alcohol intake is minimal.  Past Medical History  Diagnosis Date  . Hypertension   . Diabetes mellitus without complication     History reviewed. No pertinent past surgical history.  History reviewed. No pertinent family history.  Social History:  reports that he has never smoked. He does not have any smokeless tobacco history on file. He reports that  drinks alcohol. He reports that he does not use illicit drugs.  Allergies:  Allergies  Allergen Reactions  . Shellfish Allergy Anaphylaxis  . Codeine Itching    unknown    Medications; . amLODipine  10 mg Oral Daily  . aspirin  325 mg Oral Daily  . cloNIDine  0.3 mg Oral BID  . enoxaparin (LOVENOX) injection  40  mg Subcutaneous Q24H  . insulin aspart  0-5 Units Subcutaneous QHS  . insulin aspart  0-9 Units Subcutaneous TID WC  . lisinopril  20 mg Oral BID  . metoprolol  50 mg Oral BID  . pantoprazole  40 mg Oral Daily   PRN Meds acetaminophen, gi cocktail, morphine injection, nitroGLYCERIN, ondansetron (ZOFRAN) IV Results for orders placed during the hospital encounter of 03/20/13 (from the past 48 hour(s))  CBC     Status: Abnormal   Collection Time    03/20/13  4:27 PM      Result Value Range   WBC 12.9 (*) 4.0 - 10.5 K/uL   RBC 5.31  4.22 - 5.81 MIL/uL   Hemoglobin 15.7  13.0 - 17.0 g/dL   HCT 16.1  09.6 - 04.5 %   MCV 79.8  78.0 - 100.0 fL   MCH 29.6  26.0 - 34.0 pg   MCHC 37.0 (*) 30.0 - 36.0 g/dL   RDW 40.9  81.1 - 91.4 %   Platelets 230  150 - 400 K/uL  BASIC METABOLIC PANEL     Status: Abnormal   Collection Time    03/20/13  4:27 PM      Result Value Range   Sodium 128 (*) 135 - 145 mEq/L   Potassium 4.1  3.5 - 5.1 mEq/L   Chloride 92 (*) 96 - 112 mEq/L   CO2 21  19 - 32 mEq/L   Glucose, Bld 186 (*) 70 - 99 mg/dL   BUN 13  6 -  23 mg/dL   Creatinine, Ser 1.91  0.50 - 1.35 mg/dL   Calcium 47.8  8.4 - 29.5 mg/dL   GFR calc non Af Amer 81 (*) >90 mL/min   GFR calc Af Amer >90  >90 mL/min   Comment:            The eGFR has been calculated     using the CKD EPI equation.     This calculation has not been     validated in all clinical     situations.     eGFR's persistently     <90 mL/min signify     possible Chronic Kidney Disease.  LIPASE, BLOOD     Status: None   Collection Time    03/20/13  4:27 PM      Result Value Range   Lipase 43  11 - 59 U/L  HEPATIC FUNCTION PANEL     Status: Abnormal   Collection Time    03/20/13  4:27 PM      Result Value Range   Total Protein 8.7 (*) 6.0 - 8.3 g/dL   Albumin 3.8  3.5 - 5.2 g/dL   AST 22  0 - 37 U/L   ALT 26  0 - 53 U/L   Alkaline Phosphatase 54  39 - 117 U/L   Total Bilirubin 0.4  0.3 - 1.2 mg/dL   Bilirubin, Direct  <6.2  0.0 - 0.3 mg/dL   Indirect Bilirubin NOT CALCULATED  0.3 - 0.9 mg/dL  POCT I-STAT TROPONIN I     Status: None   Collection Time    03/20/13  4:35 PM      Result Value Range   Troponin i, poc 0.00  0.00 - 0.08 ng/mL   Comment 3            Comment: Due to the release kinetics of cTnI,     a negative result within the first hours     of the onset of symptoms does not rule out     myocardial infarction with certainty.     If myocardial infarction is still suspected,     repeat the test at appropriate intervals.  GLUCOSE, CAPILLARY     Status: Abnormal   Collection Time    03/21/13  1:03 AM      Result Value Range   Glucose-Capillary 210 (*) 70 - 99 mg/dL  GLUCOSE, CAPILLARY     Status: Abnormal   Collection Time    03/21/13  6:20 AM      Result Value Range   Glucose-Capillary 188 (*) 70 - 99 mg/dL   Comment 1 Notify RN     Comment 2 Documented in Chart    TROPONIN I     Status: None   Collection Time    03/21/13  9:30 AM      Result Value Range   Troponin I <0.30  <0.30 ng/mL   Comment:            Due to the release kinetics of cTnI,     a negative result within the first hours     of the onset of symptoms does not rule out     myocardial infarction with certainty.     If myocardial infarction is still suspected,     repeat the test at appropriate intervals.  BASIC METABOLIC PANEL     Status: Abnormal   Collection Time    03/21/13  9:30 AM  Result Value Range   Sodium 127 (*) 135 - 145 mEq/L   Potassium 4.0  3.5 - 5.1 mEq/L   Chloride 89 (*) 96 - 112 mEq/L   CO2 23  19 - 32 mEq/L   Glucose, Bld 192 (*) 70 - 99 mg/dL   BUN 11  6 - 23 mg/dL   Creatinine, Ser 1.61  0.50 - 1.35 mg/dL   Calcium 9.7  8.4 - 09.6 mg/dL   GFR calc non Af Amer 82 (*) >90 mL/min   GFR calc Af Amer >90  >90 mL/min   Comment:            The eGFR has been calculated     using the CKD EPI equation.     This calculation has not been     validated in all clinical     situations.      eGFR's persistently     <90 mL/min signify     possible Chronic Kidney Disease.  CBC     Status: Abnormal   Collection Time    03/21/13  9:30 AM      Result Value Range   WBC 12.9 (*) 4.0 - 10.5 K/uL   RBC 5.09  4.22 - 5.81 MIL/uL   Hemoglobin 14.8  13.0 - 17.0 g/dL   HCT 04.5  40.9 - 81.1 %   MCV 78.6  78.0 - 100.0 fL   MCH 29.1  26.0 - 34.0 pg   MCHC 37.0 (*) 30.0 - 36.0 g/dL   RDW 91.4  78.2 - 95.6 %   Platelets 216  150 - 400 K/uL  GLUCOSE, CAPILLARY     Status: Abnormal   Collection Time    03/21/13 10:57 AM      Result Value Range   Glucose-Capillary 208 (*) 70 - 99 mg/dL   Comment 1 Notify RN    TROPONIN I     Status: None   Collection Time    03/21/13 12:25 PM      Result Value Range   Troponin I <0.30  <0.30 ng/mL   Comment:            Due to the release kinetics of cTnI,     a negative result within the first hours     of the onset of symptoms does not rule out     myocardial infarction with certainty.     If myocardial infarction is still suspected,     repeat the test at appropriate intervals.    Dg Chest 2 View  03/20/2013   *RADIOLOGY REPORT*  Clinical Data: The chest pain  CHEST - 2 VIEW  Comparison: Prior chest x-ray 03/01/2006  Findings: Low inspiratory volumes.  Negative for focal airspace consolidation, pleural effusion, pneumothorax or pulmonary edema. Cardiomegaly with left ventricular prominence appears similar compared to prior imaging.  No acute osseous abnormality. Multilevel degenerative changes throughout the spine.  IMPRESSION: Stable appearance of the low lung volumes.  Otherwise, no acute cardiopulmonary disease.   Original Report Authenticated By: Malachy Moan, M.D.   Ct Abdomen Pelvis W Contrast  03/21/2013   *RADIOLOGY REPORT*  Clinical Data: Abdominal pain, leukocytosis  CT ABDOMEN AND PELVIS WITH CONTRAST  Technique:  Multidetector CT imaging of the abdomen and pelvis was performed following the standard protocol during bolus administration  of intravenous contrast.  Contrast: OMNIPAQUE IOHEXOL 300 MG/ML  SOLN  Comparison: 06/16/2010  Findings: Scarring in the bilateral lower lobes.  Liver, spleen, and adrenal glands are  within normal limits.  Inflammatory changes along the inferior aspect of the pancreatic uncinate process (series 2/images 39 and 41).  The appearance is worrisome for focal pancreatitis.  No definite underlying mass is seen.  No associated pancreatic atrophy or main pancreatic ductal dilatation.  The inflammatory process is also adjacent to the third portion of the duodenum.  In the setting of a normal lipase, duodenitis (with secondary involvement of the pancreas) is another consideration.  No drainable fluid collection/abscess.  No free air.  Gallbladder is unremarkable.  No intrahepatic or extrahepatic ductal dilatation.  Kidneys are within normal limits.  No hydronephrosis.  No evidence of bowel obstruction.  Normal appendix.  No evidence of abdominal aortic aneurysm.  No abdominopelvic ascites.  No suspicious abdominopelvic lymphadenopathy.  Prostate is unremarkable.  Bladder is within normal limits.  Small fat-containing right inguinal hernia.  Degenerative changes of the visualized thoracolumbar spine.  IMPRESSION: Possible focal pancreatitis versus duodenitis at the junction of the uncinate process and third portion of the duodenum.  No definite underlying pancreatic mass is seen.  Follow-up MRI abdomen with/without contrast is suggested in 6 weeks.  No drainable fluid collection/abscess.  No free air.   Original Report Authenticated By: Charline Bills, M.D.               Blood pressure 111/74, pulse 74, temperature 98.2 F (36.8 C), temperature source Oral, resp. rate 20, height 6\' 4"  (1.93 m), weight 144.834 kg (319 lb 4.8 oz), SpO2 95.00%.  Physical exam:   General-- African-American male no distress. Heart-- regular rate and rhythm without murmurs are gallops Lungs--clear Abdomen-- obese ,  nondistended, bowel sounds for positive   Assessment: 1. Epigastric pain. CT does suggest irritation around the pancreas. This could be from a duodenal ulcer. There is no sign in CT of biliary disease and the liver tests are normal.  Plan: 1. We'll go ahead and increase the protonix empirically. We'll repeat labs in the morning. 2. We will plan on collective EGD in the morning by Dr Evette Cristal. The procedure is been explained to the patient and his family.   Nashanti Duquette JR,Lashika Erker L 03/21/2013, 2:51 PM

## 2013-03-21 NOTE — Progress Notes (Signed)
1610R Placed a calll top Dr. Arbutus Leas the patient c/ o feeling of abdominal bloatness. . Abdomen firmer this time No pain . Placed a call to Dr.Tat

## 2013-03-21 NOTE — ED Notes (Signed)
4 east RN updated with re: pt. Pain-free. EKG WNL.

## 2013-03-21 NOTE — Progress Notes (Addendum)
Inpatient Diabetes Program Recommendations  AACE/ADA: New Consensus Statement on Inpatient Glycemic Control (2013)  Target Ranges:  Prepandial:   less than 140 mg/dL      Peak postprandial:   less than 180 mg/dL (1-2 hours)      Critically ill patients:  140 - 180 mg/dL     Results for MALAKHAI, BEITLER (MRN [id number]) as of 03/21/2013 09:49  Ref. Range 03/21/2013 01:03 03/21/2013 06:20  Glucose-Capillary Latest Range: 70-99 mg/dL 629 (H) 528 (H)    Patient with history of DM per records.  **MD- Please start Novolog Moderate correction scale Q4 hours while NPO (can switch to tid ac + HS once eating)  **Noted A1c ordered    Will follow. Ambrose Finland RN, MSN, CDE Diabetes Coordinator Inpatient Diabetes Program [phone]

## 2013-03-21 NOTE — Progress Notes (Signed)
Pt.is a new admit who arrived from the ED by stretcher around 0100. He is A/Ox4, on room air, and had no IV pump. He was able to ambulate from stretcher to bed without assistance. He has no signs of distress. During the shift patient did c/o epigastric pain and prn medications were given.

## 2013-03-21 NOTE — Progress Notes (Signed)
Utilization Review Completed.   Lucas Winograd, RN, BSN Nurse Case Manager  [phone]  

## 2013-03-22 ENCOUNTER — Encounter (HOSPITAL_COMMUNITY)
Admission: EM | Disposition: A | Discharge status: Home / Self Care | Payer: Self-pay | Source: Home / Self Care | Attending: Emergency Medicine

## 2013-03-22 DIAGNOSIS — K319 Disease of stomach and duodenum, unspecified: Secondary | ICD-10-CM | POA: Diagnosis not present

## 2013-03-22 DIAGNOSIS — I10 Essential (primary) hypertension: Secondary | ICD-10-CM | POA: Diagnosis not present

## 2013-03-22 DIAGNOSIS — K2960 Other gastritis without bleeding: Secondary | ICD-10-CM | POA: Diagnosis not present

## 2013-03-22 DIAGNOSIS — R1013 Epigastric pain: Secondary | ICD-10-CM | POA: Diagnosis not present

## 2013-03-22 DIAGNOSIS — E871 Hypo-osmolality and hyponatremia: Secondary | ICD-10-CM | POA: Diagnosis not present

## 2013-03-22 HISTORY — PX: ESOPHAGOGASTRODUODENOSCOPY: SHX5428

## 2013-03-22 LAB — BASIC METABOLIC PANEL
BUN: 13 mg/dL (ref 6–23)
Chloride: 88 mEq/L — ABNORMAL LOW (ref 96–112)
Glucose, Bld: 159 mg/dL — ABNORMAL HIGH (ref 70–99)
Potassium: 4.2 mEq/L (ref 3.5–5.1)

## 2013-03-22 LAB — LIPID PANEL
Cholesterol: 136 mg/dL (ref 0–200)
Total CHOL/HDL Ratio: 2.1 RATIO
Triglycerides: 60 mg/dL (ref <150)
VLDL: 12 mg/dL (ref 0–40)

## 2013-03-22 LAB — CBC
HCT: 35.9 % — ABNORMAL LOW (ref 39.0–52.0)
Hemoglobin: 13.3 g/dL (ref 13.0–17.0)
RBC: 4.52 MIL/uL (ref 4.22–5.81)
WBC: 11 10*3/uL — ABNORMAL HIGH (ref 4.0–10.5)

## 2013-03-22 LAB — GLUCOSE, CAPILLARY: Glucose-Capillary: 166 mg/dL — ABNORMAL HIGH (ref 70–99)

## 2013-03-22 SURGERY — EGD (ESOPHAGOGASTRODUODENOSCOPY)
Anesthesia: Moderate Sedation

## 2013-03-22 MED ORDER — SODIUM CHLORIDE 0.9 % IV SOLN
INTRAVENOUS | Status: DC
  Administered 2013-03-22: 22:00:00 via INTRAVENOUS

## 2013-03-22 MED ORDER — SODIUM CHLORIDE 0.9 % IV SOLN
INTRAVENOUS | Status: DC
  Administered 2013-03-22: 07:00:00 via INTRAVENOUS

## 2013-03-22 MED ORDER — BUTAMBEN-TETRACAINE-BENZOCAINE 2-2-14 % EX AERO
INHALATION_SPRAY | CUTANEOUS | Status: DC | PRN
  Administered 2013-03-22: 2 via TOPICAL

## 2013-03-22 MED ORDER — FENTANYL CITRATE 0.05 MG/ML IJ SOLN
INTRAMUSCULAR | Status: DC | PRN
  Administered 2013-03-22 (×2): 25 ug via INTRAVENOUS

## 2013-03-22 MED ORDER — MIDAZOLAM HCL 10 MG/2ML IJ SOLN
INTRAMUSCULAR | Status: DC | PRN
  Administered 2013-03-22: 2 mg via INTRAVENOUS
  Administered 2013-03-22: 1 mg via INTRAVENOUS
  Administered 2013-03-22: 2 mg via INTRAVENOUS

## 2013-03-22 MED ORDER — PNEUMOCOCCAL VAC POLYVALENT 25 MCG/0.5ML IJ INJ
0.5000 mL | INJECTION | INTRAMUSCULAR | Status: AC
  Administered 2013-03-23: 0.5 mL via INTRAMUSCULAR
  Filled 2013-03-22: qty 0.5

## 2013-03-22 NOTE — Op Note (Signed)
[facility] [address] [place], [address]   ENDOSCOPY PROCEDURE REPORT  PATIENT: Anthony, Barnes  MR#: [id number] BIRTHDATE: 1955/09/13 , 57  yrs. old GENDER: Male ENDOSCOPIST: Wandalee Ferdinand, MD REFERRED BY: PROCEDURE DATE:  03/22/2013 PROCEDURE:   EGD with biopsy ASA CLASS: 2 INDICATIONS: epigastric pain MEDICATIONS: fentanyl 50 mcg IV, Versed 5 mg IV TOPICAL ANESTHETIC:  DESCRIPTION OF PROCEDURE:   After the risks benefits and alternatives of the procedure were thoroughly explained, informed consent was obtained.  The PENTAX GASTOROSCOPE [id number]  endoscope was introduced through the mouth and advanced to the second portion of the duodenum      , limited by Without limitations.   The instrument was slowly withdrawn as the mucosa was fully examined.        FINDINGS:  Esophagus: Normal  Stomach: Erosive antral gastritis, biopsy obtained primarily to look for H. pylori  Duodenum: Nodular duodenitis in distal bulb and second portion of the duodenum  COMPLICATIONS:none  ENDOSCOPIC IMPRESSION:see above   RECOMMENDATIONS:PPI therapy, advance diet as tolerated      _______________________________ Rosalie DoctorWandalee Ferdinand, MD 03/22/2013 10:58 AM       PATIENT NAME:  Anthony, Barnes MR#: [id number]

## 2013-03-22 NOTE — Progress Notes (Signed)
110 Telephone report received from Darl Pikes , endo RN 1200 sitting in room with family in attendance . With pleasant mood . No complaint presented

## 2013-03-22 NOTE — Plan of Care (Signed)
Problem: Phase I Progression Outcomes Goal: OOB as tolerated unless otherwise ordered Outcome: Completed/Met Date Met:  03/22/13 Pt OOB ad lib in room. Pt steady on feet.

## 2013-03-22 NOTE — Progress Notes (Signed)
Pt complained of feeling bloated in abdomen at beginning of shift with no pain. Pt given simethicone prn. Abdomen firm to touch, bowel sounds present. Pt given pain medicine this shift when needed for abdominal pain (score 5-7). Pt consent signed for EGD this AM, pt NPO since midnight. Pt in no apparent distress this morning, resting comfortably in room. Baron Hamper, RN 03/22/2013

## 2013-03-22 NOTE — Progress Notes (Signed)
[facility] PROGRESS NOTE  Anthony Barnes ZOX:[id number] DOB: 1956-05-02 DOA: 03/20/2013 PCP: Mariea Clonts, NP  Assessment/Plan: Epigastric/abdominal pain  -Etiology unclear, workup in progress  -CT abdomen pelvis--inflammatory changes in the inferior aspect of the pancreatic uncinate process--> unlikely pancreatitis due to normal lipase and the fact that patient is tolerating diet without any worsening abdominal pain. -He was able to eat [place] fried chicken this evening without difficulty. -Gastroenterology was consulted -03/22/2013 EGD showed erosive antral gastritis and nodular duodenitis--> patient will be placed on PPI therapy -Hepatic enzymes, lipase normal  -Urinalysis, urine drug screen  -cycle troponins--neg -EKG shows nonspecific ST-T wave changes, likely due to LVH  -morphine when necessary pain  Leukocytosis  -Patient remains afebrile and hemodynamically stable  -Urinalysis negative for pyuria -Improving without antibiotics. Patient is afebrile and hemodynamically stable Hypertension  -Continue home dose of amlodipine, Catapres, metoprolol tartrate,Lisinopril  -Discontinue Maxzide at this time due to worsen hyponatremia  -Blood pressure was soft after his EGD. Amlodipine, metoprolol, and lisinopril will be discontinued for now, and his blood pressures will be monitored. Diabetes mellitus type 2  -Patient is noncompliant with medications  -Check hemoglobin A1c--7.7 -NovoLog sliding scale  History of subarachnoid hemorrhage  -Related to uncontrolled hypertension in 2007  -Optimize blood pressure control  Lower extremity edema and pain  -Venous duplex rule out DVT  Hyponatremia  -Likely due to the patient's diuretic  -Discontinue Maxzide  -Check TSH--1.708 -Start the patient on IV normal saline  Family Communication:   Pt at beside Disposition Plan:   Home when medically stable      Procedures/Studies: Dg Chest 2 View  03/20/2013    *RADIOLOGY REPORT*  Clinical Data: The chest pain  CHEST - 2 VIEW  Comparison: Prior chest x-ray 03/01/2006  Findings: Low inspiratory volumes.  Negative for focal airspace consolidation, pleural effusion, pneumothorax or pulmonary edema. Cardiomegaly with left ventricular prominence appears similar compared to prior imaging.  No acute osseous abnormality. Multilevel degenerative changes throughout the spine.  IMPRESSION: Stable appearance of the low lung volumes.  Otherwise, no acute cardiopulmonary disease.   Original Report Authenticated By: Malachy Moan, M.D.   Ct Abdomen Pelvis W Contrast  03/21/2013   *RADIOLOGY REPORT*  Clinical Data: Abdominal pain, leukocytosis  CT ABDOMEN AND PELVIS WITH CONTRAST  Technique:  Multidetector CT imaging of the abdomen and pelvis was performed following the standard protocol during bolus administration of intravenous contrast.  Contrast: OMNIPAQUE IOHEXOL 300 MG/ML  SOLN  Comparison: 06/16/2010  Findings: Scarring in the bilateral lower lobes.  Liver, spleen, and adrenal glands are within normal limits.  Inflammatory changes along the inferior aspect of the pancreatic uncinate process (series 2/images 39 and 41).  The appearance is worrisome for focal pancreatitis.  No definite underlying mass is seen.  No associated pancreatic atrophy or main pancreatic ductal dilatation.  The inflammatory process is also adjacent to the third portion of the duodenum.  In the setting of a normal lipase, duodenitis (with secondary involvement of the pancreas) is another consideration.  No drainable fluid collection/abscess.  No free air.  Gallbladder is unremarkable.  No intrahepatic or extrahepatic ductal dilatation.  Kidneys are within normal limits.  No hydronephrosis.  No evidence of bowel obstruction.  Normal appendix.  No evidence of abdominal aortic aneurysm.  No abdominopelvic ascites.  No suspicious abdominopelvic lymphadenopathy.  Prostate is unremarkable.  Bladder is  within normal limits.  Small fat-containing right inguinal hernia.  Degenerative changes of the visualized thoracolumbar spine.  IMPRESSION: Possible focal pancreatitis versus duodenitis at the junction of the uncinate process and third portion of the duodenum.  No definite underlying pancreatic mass is seen.  Follow-up MRI abdomen with/without contrast is suggested in 6 weeks.  No drainable fluid collection/abscess.  No free air.   Original Report Authenticated By: Charline Bills, M.D.         Subjective: This is his abdominal pain is much improved. He was able to eat [place] chicken this evening without any worsening abdominal pain. Denies any fevers, chills, chest pain, shortness breath, nausea, vomiting, diarrhea.  Objective: Filed Vitals:   03/22/13 1100 03/22/13 1146 03/22/13 1200 03/22/13 1408  BP: 113/73  110/71 93/57  Pulse:    86  Temp:    98.1 F (36.7 C)  TempSrc:    Oral  Resp: 14 16 16 16   Height:      Weight:      SpO2: 96% 95% 95% 98%    Intake/Output Summary (Last 24 hours) at 03/22/13 1837 Last data filed at 03/22/13 1300  Gross per 24 hour  Intake    440 ml  Output      0 ml  Net    440 ml   Weight change: -5.233 kg (-11 lb 8.6 oz) Exam:   General:  Pt is alert, follows commands appropriately, not in acute distress  HEENT: No icterus, No thrush,  Joseph/AT  Cardiovascular: RRR, S1/S2, no rubs, no gallops  Respiratory: CTA bilaterally, no wheezing, no crackles, no rhonchi  Abdomen: Soft/+BS, mild epigastric tenderness without any peritoneal signs., non distended, no guarding  Extremities: trace edema, No lymphangitis, No petechiae, No rashes, no synovitis  Data Reviewed: Basic Metabolic Panel:  Recent Labs Lab 03/20/13 1627 03/21/13 0930 03/22/13 0405  NA 128* 127* 126*  K 4.1 4.0 4.2  CL 92* 89* 88*  CO2 21 23 26   GLUCOSE 186* 192* 159*  BUN 13 11 13   CREATININE 1.02 1.01 1.24  CALCIUM 10.0 9.7 9.6   Liver Function  Tests:  Recent Labs Lab 03/20/13 1627  AST 22  ALT 26  ALKPHOS 54  BILITOT 0.4  PROT 8.7*  ALBUMIN 3.8    Recent Labs Lab 03/20/13 1627  LIPASE 43   No results found for this basename: AMMONIA,  in the last 168 hours CBC:  Recent Labs Lab 03/20/13 1627 03/21/13 0930 03/22/13 0405  WBC 12.9* 12.9* 11.0*  HGB 15.7 14.8 13.3  HCT 42.4 40.0 35.9*  MCV 79.8 78.6 79.4  PLT 230 216 251   Cardiac Enzymes:  Recent Labs Lab 03/21/13 0930 03/21/13 1225  TROPONINI <0.30 <0.30   BNP: No components found with this basename: POCBNP,  CBG:  Recent Labs Lab 03/21/13 1607 03/21/13 2148 03/22/13 0554 03/22/13 1128 03/22/13 1646  GLUCAP 194* 164* 166* 188* 127*    No results found for this or any previous visit (from the past 240 hour(s)).   Scheduled Meds: . aspirin  325 mg Oral Daily  . cloNIDine  0.3 mg Oral BID  . enoxaparin (LOVENOX) injection  40 mg Subcutaneous Q24H  . insulin aspart  0-5 Units Subcutaneous QHS  . insulin aspart  0-9 Units Subcutaneous TID WC  . pantoprazole  40 mg Oral BID  . [START ON 03/23/2013] pneumococcal 23 valent vaccine  0.5 mL Intramuscular Tomorrow-1000   Continuous Infusions:    Javaria Knapke, DO  [facility] Pager [phone]  If 7PM-7AM, please contact night-coverage [email or web address] Password [facility] 03/22/2013, 6:37 PM   LOS:  2 days

## 2013-03-23 DIAGNOSIS — K2960 Other gastritis without bleeding: Secondary | ICD-10-CM | POA: Diagnosis not present

## 2013-03-23 DIAGNOSIS — I10 Essential (primary) hypertension: Secondary | ICD-10-CM | POA: Diagnosis not present

## 2013-03-23 DIAGNOSIS — E119 Type 2 diabetes mellitus without complications: Secondary | ICD-10-CM | POA: Diagnosis not present

## 2013-03-23 DIAGNOSIS — R079 Chest pain, unspecified: Secondary | ICD-10-CM | POA: Diagnosis not present

## 2013-03-23 LAB — CBC
HCT: 37.3 % — ABNORMAL LOW (ref 39.0–52.0)
Hemoglobin: 13.6 g/dL (ref 13.0–17.0)
MCH: 28.9 pg (ref 26.0–34.0)
MCHC: 36.5 g/dL — ABNORMAL HIGH (ref 30.0–36.0)
MCV: 79.2 fL (ref 78.0–100.0)

## 2013-03-23 LAB — BASIC METABOLIC PANEL
BUN: 20 mg/dL (ref 6–23)
CO2: 25 mEq/L (ref 19–32)
Chloride: 92 mEq/L — ABNORMAL LOW (ref 96–112)
Glucose, Bld: 157 mg/dL — ABNORMAL HIGH (ref 70–99)
Potassium: 4.3 mEq/L (ref 3.5–5.1)

## 2013-03-23 LAB — GLUCOSE, CAPILLARY

## 2013-03-23 MED ORDER — GLIPIZIDE 5 MG PO TABS: 5.0000 mg | ORAL_TABLET | Freq: Every day | ORAL | Status: DC

## 2013-03-23 MED ORDER — PANTOPRAZOLE SODIUM 40 MG PO TBEC: 40.0000 mg | DELAYED_RELEASE_TABLET | Freq: Two times a day (BID) | ORAL | Status: DC

## 2013-03-23 NOTE — Discharge Summary (Signed)
Physician Discharge Summary  Anthony Barnes:[id number] DOB: [birth year] DOA: 03/20/2013  PCP: Mariea Clonts, NP  Admit date: 03/20/2013 Discharge date: 03/23/2013  Recommendations for Outpatient Follow-up:  1. Pt will need to follow up with PCP in 1 weeks post discharge 2. Please obtain BMP to evaluate electrolytes and kidney function 3. Please also check CBC to evaluate Hg and Hct levels 4. followup with gastroenterology, Dr. Carman Ching in one month  Discharge Diagnoses:  Principal Problem:   Epigastric pain Active Problems:   HTN (hypertension)   DM2 (diabetes mellitus, type 2)   Unspecified essential hypertension   Hyponatremia   Erosive gastritis Epigastric/abdominal pain  -likely due to erosive gastritis and duodenitis -CT abdomen pelvis--inflammatory changes in the inferior aspect of the pancreatic uncinate process--> unlikely pancreatitis due to normal lipase and the fact that patient is tolerating diet without any worsening abdominal pain.  -He was able to eat [place] fried chicken the day prior to dischargewithout difficulty. He continued to tolerate his diet without any worsening abdominal pain or vomiting. -Gastroenterology was consulted,Dr. Carman Ching -03/22/2013 EGD showed erosive antral gastritis and nodular duodenitis--> patient will be placed on PPI therapy twice a day -Hepatic enzymes, lipase normal  -Urinalysis, urine drug screen  -cycle troponins--neg  -EKG shows nonspecific ST-T wave changes, likely due to LVH  -morphine when necessary pain  Leukocytosis  -Patient remains afebrile and hemodynamically stable  -Urinalysis negative for pyuria  -Improvied without antibiotics. Patient is afebrile and hemodynamically stable  Hypertension  -Continue home dose of amlodipine, Catapres, metoprolol tartrate,Lisinopril  -Discontinue Maxzide at this time due to worsen hyponatremia  -Blood pressure was soft after his EGD. Amlodipine, metoprolol, and  lisinopril were be discontinued after his EGD, and his blood pressures will be monitored.  -the patient's blood pressure the following day began to increase off of his antihypertensive medications noted above. -The patient will be restarted on his home antihypertensive regimen with the exception of Maxzide which caused him to be hyponatremic. Diabetes mellitus type 2  -Patient is noncompliant with medications--he was not taking any medications in the outpatient setting -Check hemoglobin A1c--7.7  -NovoLog sliding scale while the patient was in the hospital -The patient will be discharged with glipizide 5 mg daily -The patient was instructed to followup with his primary care physician for further titration of his diabetes medications History of subarachnoid hemorrhage  -Related to uncontrolled hypertension in 2007  -Optimize blood pressure control  Lower extremity edema and pain  -Venous duplex rule out DVT--negative Hyponatremia  -Likely due to the patient's diuretic and volume depletion. -Discontinue Maxzide  -Check TSH--1.708  -Start the patient on IV normal saline--patient's sodium improved -patient was instructed to followup with his primary care provider within one week for blood work and monitoring of his blood pressure and hyperglycemia Family Communication: Pt at beside  Disposition Plan: Home when medically stable   Discharge Condition: stable  Disposition:  Follow-up Information   Follow up with FOSTER, CHRISTOPHER B, NP In 1 week.   Contact information:   [address] [place] [address] [phone]       Follow up with EDWARDS JR,JAMES L, MD In 1 month.   Contact information:   [address]                         [place] [address] [phone]       Diet:1800calorie ADA Wt Readings from Last  3 Encounters:  03/23/13 143 kg (315 lb 4.1 oz)  03/23/13 143 kg (315 lb 4.1 oz)    History of present illness:  57 year old male  with a history of diabetes mellitus, hypertension presents with 3 days of abdominal pain. No abdominal pain continued to worsen. It was isolated and epigastric area.  The patient denied any NSAID use. He denies any history of drug use or alcohol intake. There was no chest pain, shortness breath, dizziness, fevers, chills,nausea, vomiting, diarrhea, hematochezia, melena. There was concern for coronary syndrome. Patient was admitted as a result to rule out. Troponins are negative. EKG did  showed nonspecific ST-T wave changes.hepatic enzymes were normal. Lipase was normal.   Consultants: Gastroenterology, Dr. Carman Ching  Discharge Exam: Filed Vitals:   03/23/13 0857  BP: 145/83  Pulse: 92  Temp:   Resp: 16   Filed Vitals:   03/22/13 1408 03/22/13 2033 03/23/13 0635 03/23/13 0857  BP: 93/57 125/80 132/86 145/83  Pulse: 86 81 78 92  Temp: 98.1 F (36.7 C) 98.2 F (36.8 C) 98.1 F (36.7 C)   TempSrc: Oral Oral Oral   Resp: 16 17 18 16   Height:      Weight:   143 kg (315 lb 4.1 oz)   SpO2: 98% 100% 98% 98%   General: A&O x 3, NAD, pleasant, cooperative Cardiovascular: RRR, no rub, no gallop, no S3 Respiratory: CTAB, no wheeze, no rhonchi Abdomen:soft, nontender, nondistended, positive bowel sounds Extremities: No edema, No lymphangitis, no petechiae  Discharge Instructions  Discharge Orders   Future Orders Complete By Expires     Diet - low sodium heart healthy  As directed     Discharge instructions  As directed     Comments:      Stop Maxzide Call your family doctor for a followup appointment for this week    Increase activity slowly  As directed         Medication List    STOP taking these medications       triamterene-hydrochlorothiazide 75-50 MG per tablet  Commonly known as:  MAXZIDE      TAKE these medications       amLODipine 10 MG tablet  Commonly known as:  NORVASC  Take 10 mg by mouth daily.     aspirin 325 MG tablet  Take 325 mg by mouth daily.      cloNIDine 0.3 MG tablet  Commonly known as:  CATAPRES  Take 0.3 mg by mouth 2 (two) times daily.     glipiZIDE 5 MG tablet  Commonly known as:  GLUCOTROL  Take 1 tablet (5 mg total) by mouth daily with breakfast.     lisinopril 20 MG tablet  Commonly known as:  PRINIVIL,ZESTRIL  Take 20 mg by mouth 2 (two) times daily.     metoprolol 50 MG tablet  Commonly known as:  LOPRESSOR  Take 50 mg by mouth 2 (two) times daily.     multivitamin with minerals Tabs tablet  Take 1 tablet by mouth daily.     OVER THE COUNTER MEDICATION  Take 3 tablets by mouth every other day. "Nugenix" testosterone supplement     pantoprazole 40 MG tablet  Commonly known as:  PROTONIX  Take 1 tablet (40 mg total) by mouth 2 (two) times daily.         The results of significant diagnostics from this hospitalization (including imaging, microbiology, ancillary and laboratory) are listed below for reference.    Significant Diagnostic Studies:  Dg Chest 2 View  03/20/2013   *RADIOLOGY REPORT*  Clinical Data: The chest pain  CHEST - 2 VIEW  Comparison: Prior chest x-ray 03/01/2006  Findings: Low inspiratory volumes.  Negative for focal airspace consolidation, pleural effusion, pneumothorax or pulmonary edema. Cardiomegaly with left ventricular prominence appears similar compared to prior imaging.  No acute osseous abnormality. Multilevel degenerative changes throughout the spine.  IMPRESSION: Stable appearance of the low lung volumes.  Otherwise, no acute cardiopulmonary disease.   Original Report Authenticated By: Malachy Moan, M.D.   Ct Abdomen Pelvis W Contrast  03/21/2013   *RADIOLOGY REPORT*  Clinical Data: Abdominal pain, leukocytosis  CT ABDOMEN AND PELVIS WITH CONTRAST  Technique:  Multidetector CT imaging of the abdomen and pelvis was performed following the standard protocol during bolus administration of intravenous contrast.  Contrast: OMNIPAQUE IOHEXOL 300 MG/ML  SOLN  Comparison:  06/16/2010  Findings: Scarring in the bilateral lower lobes.  Liver, spleen, and adrenal glands are within normal limits.  Inflammatory changes along the inferior aspect of the pancreatic uncinate process (series 2/images 39 and 41).  The appearance is worrisome for focal pancreatitis.  No definite underlying mass is seen.  No associated pancreatic atrophy or main pancreatic ductal dilatation.  The inflammatory process is also adjacent to the third portion of the duodenum.  In the setting of a normal lipase, duodenitis (with secondary involvement of the pancreas) is another consideration.  No drainable fluid collection/abscess.  No free air.  Gallbladder is unremarkable.  No intrahepatic or extrahepatic ductal dilatation.  Kidneys are within normal limits.  No hydronephrosis.  No evidence of bowel obstruction.  Normal appendix.  No evidence of abdominal aortic aneurysm.  No abdominopelvic ascites.  No suspicious abdominopelvic lymphadenopathy.  Prostate is unremarkable.  Bladder is within normal limits.  Small fat-containing right inguinal hernia.  Degenerative changes of the visualized thoracolumbar spine.  IMPRESSION: Possible focal pancreatitis versus duodenitis at the junction of the uncinate process and third portion of the duodenum.  No definite underlying pancreatic mass is seen.  Follow-up MRI abdomen with/without contrast is suggested in 6 weeks.  No drainable fluid collection/abscess.  No free air.   Original Report Authenticated By: Charline Bills, M.D.     Microbiology: No results found for this or any previous visit (from the past 240 hour(s)).   Labs: Basic Metabolic Panel:  Recent Labs Lab 03/20/13 1627 03/21/13 0930 03/22/13 0405 03/23/13 0415  NA 128* 127* 126* 128*  K 4.1 4.0 4.2 4.3  CL 92* 89* 88* 92*  CO2 21 23 26 25   GLUCOSE 186* 192* 159* 157*  BUN 13 11 13 20   CREATININE 1.02 1.01 1.24 1.25  CALCIUM 10.0 9.7 9.6 9.4   Liver Function Tests:  Recent Labs Lab  03/20/13 1627  AST 22  ALT 26  ALKPHOS 54  BILITOT 0.4  PROT 8.7*  ALBUMIN 3.8    Recent Labs Lab 03/20/13 1627  LIPASE 43   No results found for this basename: AMMONIA,  in the last 168 hours CBC:  Recent Labs Lab 03/20/13 1627 03/21/13 0930 03/22/13 0405 03/23/13 0415  WBC 12.9* 12.9* 11.0* 8.7  HGB 15.7 14.8 13.3 13.6  HCT 42.4 40.0 35.9* 37.3*  MCV 79.8 78.6 79.4 79.2  PLT 230 216 251 267   Cardiac Enzymes:  Recent Labs Lab 03/21/13 0930 03/21/13 1225  TROPONINI <0.30 <0.30   BNP: No components found with this basename: POCBNP,  CBG:  Recent Labs Lab 03/22/13 0554 03/22/13 1128 03/22/13  1646 03/22/13 2104 03/23/13 0612  GLUCAP 166* 188* 127* 165* 173*    Time coordinating discharge:  Greater than 30 minutes  Signed:  Massie Mees, DO [facility] Pager: [phone] 03/23/2013, 11:26 AM

## 2013-03-24 ENCOUNTER — Encounter (HOSPITAL_COMMUNITY): Payer: Self-pay | Admitting: Gastroenterology

## 2013-04-01 ENCOUNTER — Ambulatory Visit: Payer: Medicare Other | Attending: Family Medicine | Admitting: Internal Medicine

## 2013-04-01 ENCOUNTER — Inpatient Hospital Stay: Payer: Medicare Other

## 2013-04-01 VITALS — BP 134/82 | HR 77 | Temp 98.3°F | Resp 16 | Wt 326.6 lb

## 2013-04-01 DIAGNOSIS — I10 Essential (primary) hypertension: Secondary | ICD-10-CM | POA: Diagnosis not present

## 2013-04-01 DIAGNOSIS — K219 Gastro-esophageal reflux disease without esophagitis: Secondary | ICD-10-CM | POA: Diagnosis not present

## 2013-04-01 DIAGNOSIS — E1110 Type 2 diabetes mellitus with ketoacidosis without coma: Secondary | ICD-10-CM

## 2013-04-01 DIAGNOSIS — E1310 Other specified diabetes mellitus with ketoacidosis without coma: Secondary | ICD-10-CM | POA: Diagnosis not present

## 2013-04-01 LAB — GLUCOSE, POCT (MANUAL RESULT ENTRY): POC Glucose: 68 mg/dl — AB (ref 70–99)

## 2013-04-01 MED ORDER — METOPROLOL TARTRATE 50 MG PO TABS: 50.0000 mg | ORAL_TABLET | Freq: Two times a day (BID) | ORAL | Status: DC

## 2013-04-01 MED ORDER — GLIPIZIDE 5 MG PO TABS: 5.0000 mg | ORAL_TABLET | Freq: Every day | ORAL | Status: DC

## 2013-04-01 MED ORDER — CLONIDINE HCL 0.3 MG PO TABS: 0.3000 mg | ORAL_TABLET | Freq: Two times a day (BID) | ORAL | Status: DC

## 2013-04-01 MED ORDER — PANTOPRAZOLE SODIUM 40 MG PO TBEC: 40.0000 mg | DELAYED_RELEASE_TABLET | Freq: Two times a day (BID) | ORAL | Status: DC

## 2013-04-01 MED ORDER — ASPIRIN 325 MG PO TABS: 325.0000 mg | ORAL_TABLET | Freq: Every day | ORAL | Status: DC

## 2013-04-01 MED ORDER — AMLODIPINE BESYLATE 10 MG PO TABS: 10.0000 mg | ORAL_TABLET | Freq: Every day | ORAL | Status: DC

## 2013-04-01 MED ORDER — LISINOPRIL 20 MG PO TABS: 20.0000 mg | ORAL_TABLET | Freq: Every day | ORAL | Status: DC

## 2013-04-01 NOTE — Progress Notes (Signed)
Patient ID: Anthony Barnes, male   DOB: [birth year], 57 y.o.   MRN: [id number]  CC: To establish care  HPI: Patient is 57 years old pleasant man came to the clinic today to establish care. His previous primary care physician recently relocated and he had to finding a new doctor. He was recently admitted in the hospital for acute epigastric pain. EGD showed erosive gastritis and nodular duodenitis. Patient also has past history of hypertension, diabetes, gastroesophageal reflux disease, hyponatremia. He has no specific complaints today, however he needs refill of all his medications. Denies chest pain, said epigastric pain is better, no nausea or vomiting, no abdominal pain, no diarrhea or constipation. He claims to drink at least 2 bottles of water everyday, still attempting to improve his exercise regimen. His last hemoglobin A1c 7.7.  Allergies  Allergen Reactions  . Shellfish Allergy Anaphylaxis  . Codeine Itching    unknown   Past Medical History  Diagnosis Date  . Hypertension   . Diabetes mellitus without complication    Current Outpatient Prescriptions on File Prior to Visit  Medication Sig Dispense Refill  . amLODipine (NORVASC) 10 MG tablet Take 10 mg by mouth daily.      Marland Kitchen aspirin 325 MG tablet Take 325 mg by mouth daily.       . cloNIDine (CATAPRES) 0.3 MG tablet Take 0.3 mg by mouth 2 (two) times daily.       Marland Kitchen glipiZIDE (GLUCOTROL) 5 MG tablet Take 1 tablet (5 mg total) by mouth daily with breakfast.  30 tablet  0  . lisinopril (PRINIVIL,ZESTRIL) 20 MG tablet Take 20 mg by mouth 2 (two) times daily.      . metoprolol (LOPRESSOR) 50 MG tablet Take 50 mg by mouth 2 (two) times daily.      . Multiple Vitamin (MULTIVITAMIN WITH MINERALS) TABS tablet Take 1 tablet by mouth daily.      Marland Kitchen OVER THE COUNTER MEDICATION Take 3 tablets by mouth every other day. "Nugenix" testosterone supplement      . pantoprazole (PROTONIX) 40 MG tablet Take 1 tablet (40 mg total) by mouth 2 (two) times  daily.  60 tablet  0   No current facility-administered medications on file prior to visit.   History reviewed. No pertinent family history. History   Social History  . Marital Status: Married    Spouse Name: N/A    Number of Children: N/A  . Years of Education: N/A   Occupational History  . Not on file.   Social History Main Topics  . Smoking status: Never Smoker   . Smokeless tobacco: Not on file  . Alcohol Use: Yes  . Drug Use: No  . Sexual Activity: Not on file   Other Topics Concern  . Not on file   Social History Narrative  . No narrative on file    Review of Systems: Constitutional: Negative for fever, chills, diaphoresis, activity change, appetite change and fatigue. HENT: Negative for ear pain, nosebleeds, congestion, facial swelling, rhinorrhea, neck pain, neck stiffness and ear discharge.  Eyes: Negative for pain, discharge, redness, itching and visual disturbance. Respiratory: Negative for cough, choking, chest tightness, shortness of breath, wheezing and stridor.  Cardiovascular: Negative for chest pain, palpitations and leg swelling. Gastrointestinal: Negative for abdominal distention. Genitourinary: Negative for dysuria, urgency, frequency, hematuria, flank pain, decreased urine volume, difficulty urinating and dyspareunia.  Musculoskeletal: Negative for back pain, joint swelling, arthralgias and gait problem. Neurological: Negative for dizziness, tremors, seizures, syncope, facial asymmetry,  speech difficulty, weakness, light-headedness, numbness and headaches.  Hematological: Negative for adenopathy. Does not bruise/bleed easily. Psychiatric/Behavioral: Negative for hallucinations, behavioral problems, confusion, dysphoric mood, decreased concentration and agitation.    Objective:   Filed Vitals:   04/01/13 1207  BP: 134/82  Pulse: 77  Temp: 98.3 F (36.8 C)  Resp: 16    Physical Exam: Constitutional: Patient appears well-developed and  well-nourished. No distress. Obese HENT: Normocephalic, atraumatic, External right and left ear normal. Oropharynx is clear and moist.  Eyes: Conjunctivae and EOM are normal. PERRLA, no scleral icterus. Neck: Normal ROM. Neck supple. No JVD. No tracheal deviation. No thyromegaly. CVS: RRR, S1/S2 +, no murmurs, no gallops, no carotid bruit.  Pulmonary: Effort and breath sounds normal, no stridor, rhonchi, wheezes, rales.  Abdominal: Soft. BS +,  no distension, tenderness, rebound or guarding.  Musculoskeletal: Normal range of motion. No edema and no tenderness.  Lymphadenopathy: No lymphadenopathy noted, cervical, inguinal or axillary Neuro: Alert. Normal reflexes, muscle tone coordination. No cranial nerve deficit. Skin: Skin is warm and dry. No rash noted. Not diaphoretic. No erythema. No pallor. Psychiatric: Normal mood and affect. Behavior, judgment, thought content normal.  Lab Results  Component Value Date   WBC 8.7 03/23/2013   HGB 13.6 03/23/2013   HCT 37.3* 03/23/2013   MCV 79.2 03/23/2013   PLT 267 03/23/2013   Lab Results  Component Value Date   CREATININE 1.25 03/23/2013   BUN 20 03/23/2013   NA 128* 03/23/2013   K 4.3 03/23/2013   CL 92* 03/23/2013   CO2 25 03/23/2013    Lab Results  Component Value Date   HGBA1C 7.7* 03/21/2013   Lipid Panel     Component Value Date/Time   CHOL 136 03/22/2013 0405   TRIG 60 03/22/2013 0405   HDL 64 03/22/2013 0405   CHOLHDL 2.1 03/22/2013 0405   VLDL 12 03/22/2013 0405   LDLCALC 60 03/22/2013 0405   CT Abd/Pelvis: IMPRESSION:  Possible focal pancreatitis versus duodenitis at the junction of  the uncinate process and third portion of the duodenum.  No definite underlying pancreatic mass is seen. Follow-up MRI  abdomen with/without contrast is suggested in 6 weeks.  No drainable fluid collection/abscess. No free air.  Bilateral Lower Limb Doppler: Negative for DVT  EGD: Erosive Gastritis and Nodular Duodenitis     Assessment and plan:    Patient Active Problem List   Diagnosis Date Noted  . DM (diabetes mellitus) type 2, uncontrolled, with ketoacidosis 04/01/2013  . Essential hypertension, benign 04/01/2013  . GERD (gastroesophageal reflux disease) 04/01/2013  . Erosive gastritis 03/22/2013  . DM2 (diabetes mellitus, type 2) 03/21/2013  . Unspecified essential hypertension 03/21/2013  . Hyponatremia 03/21/2013  . Epigastric pain 03/20/2013  . HTN (hypertension) 03/20/2013   Refill the following medications:  Amlodipine 10 mg tablet by mouth daily  Clonidine 0.3 mg tablet by mouth twice a day  Lisinopril 20 mg tablet by mouth daily  Metoprolol 50 mg tablet by mouth twice a day  Pantoprazole 40 mg tablet by mouth twice a day  Patient was extensively counseled on nutrition and exercise Patient was encouraged to restrict water intake for his hyponatremia Patient was encouraged to be compliant with medications Follow up with gastroenterologist as scheduled Return to clinic in 4 weeks for follow  Arnetha Massy was given clear instructions to go to ER or return to the clinic if symptoms don't improve, worsen or new problems develop.  Arnetha Massy verbalized understanding.  Leonette Most  E Klem was told to call to get lab results if hasn't heard anything in the next week.         Jeanann Lewandowsky, MD [facility] [place], [place] [phone]   04/01/2013, 1:06 PM

## 2013-04-01 NOTE — Patient Instructions (Signed)
Hyponatremia  Hyponatremia is when the amount of salt (sodium) in your blood is too low. When sodium levels are low, your cells will absorb extra water and swell. The swelling happens throughout the body, but it mostly affects the brain. Severe brain swelling (cerebral edema), seizures, or coma can happen.  CAUSES   Heart, kidney, or liver problems.  Thyroid problems.  Adrenal gland problems.  Severe vomiting and diarrhea.  Certain medicines or illegal drugs.  Dehydration.  Drinking too much water.  Low-sodium diet. SYMPTOMS   Nausea and vomiting.  Confusion.  Lethargy.  Agitation.  Headache.  Twitching or shaking (seizures).  Unconsciousness.  Appetite loss.  Muscle weakness and cramping. DIAGNOSIS  Hyponatremia is identified by a simple blood test. Your caregiver will perform a history and physical exam to try to find the cause and type of hyponatremia. Other tests may be needed to measure the amount of sodium in your blood and urine. TREATMENT  Treatment will depend on the cause.   Fluids may be given through the vein (IV).  Medicines may be used to correct the sodium imbalance. If medicines are causing the problem, they will need to be adjusted.  Water or fluid intake may be restricted to restore proper balance. The speed of correcting the sodium problem is very important. If the problem is corrected too fast, nerve damage (sometimes unchangeable) can happen. HOME CARE INSTRUCTIONS   Only take medicines as directed by your caregiver. Many medicines can make hyponatremia worse. Discuss all your medicines with your caregiver.  Carefully follow any recommended diet, including any fluid restrictions.  You may be asked to repeat lab tests. Follow these directions.  Avoid alcohol and recreational drugs. SEEK MEDICAL CARE IF:   You develop worsening nausea, fatigue, headache, confusion, or weakness.  Your original hyponatremia symptoms return.  You have  problems following the recommended diet. SEEK IMMEDIATE MEDICAL CARE IF:   You have a seizure.  You faint.  You have ongoing diarrhea or vomiting. MAKE SURE YOU:   Understand these instructions.  Will watch your condition.  Will get help right away if you are not doing well or get worse. Document Released: 07/21/2002 Document Revised: 10/23/2011 Document Reviewed: 01/15/2011 [facility] Patient Information 2014 [place], [place].  Hypertension As your heart beats, it forces blood through your arteries. This force is your blood pressure. If the pressure is too high, it is called hypertension (HTN) or high blood pressure. HTN is dangerous because you may have it and not know it. High blood pressure may mean that your heart has to work harder to pump blood. Your arteries may be narrow or stiff. The extra work puts you at risk for heart disease, stroke, and other problems.  Blood pressure consists of two numbers, a higher number over a lower, 110/72, for example. It is stated as "110 over 72." The ideal is below 120 for the top number (systolic) and under 80 for the bottom (diastolic). Write down your blood pressure today. You should pay close attention to your blood pressure if you have certain conditions such as:  Heart failure.  Prior heart attack.  Diabetes  Chronic kidney disease.  Prior stroke.  Multiple risk factors for heart disease. To see if you have HTN, your blood pressure should be measured while you are seated with your arm held at the level of the heart. It should be measured at least twice. A one-time elevated blood pressure reading (especially in the Emergency Department) does not mean that you  need treatment. There may be conditions in which the blood pressure is different between your right and left arms. It is important to see your caregiver soon for a recheck. Most people have essential hypertension which means that there is not a specific cause. This type of high blood  pressure may be lowered by changing lifestyle factors such as:  Stress.  Smoking.  Lack of exercise.  Excessive weight.  Drug/tobacco/alcohol use.  Eating less salt. Most people do not have symptoms from high blood pressure until it has caused damage to the body. Effective treatment can often prevent, delay or reduce that damage. TREATMENT  When a cause has been identified, treatment for high blood pressure is directed at the cause. There are a large number of medications to treat HTN. These fall into several categories, and your caregiver will help you select the medicines that are best for you. Medications may have side effects. You should review side effects with your caregiver. If your blood pressure stays high after you have made lifestyle changes or started on medicines,   Your medication(s) may need to be changed.  Other problems may need to be addressed.  Be certain you understand your prescriptions, and know how and when to take your medicine.  Be sure to follow up with your caregiver within the time frame advised (usually within two weeks) to have your blood pressure rechecked and to review your medications.  If you are taking more than one medicine to lower your blood pressure, make sure you know how and at what times they should be taken. Taking two medicines at the same time can result in blood pressure that is too low. SEEK IMMEDIATE MEDICAL CARE IF:  You develop a severe headache, blurred or changing vision, or confusion.  You have unusual weakness or numbness, or a faint feeling.  You have severe chest or abdominal pain, vomiting, or breathing problems. MAKE SURE YOU:   Understand these instructions.  Will watch your condition.  Will get help right away if you are not doing well or get worse. Document Released: 07/31/2005 Document Revised: 10/23/2011 Document Reviewed: 03/20/2008 [facility] Patient Information 2014 [place], [place].

## 2013-04-01 NOTE — Progress Notes (Signed)
Patient is here for hospital follow up Was admitted with an inflamed pancreas

## 2013-04-09 ENCOUNTER — Telehealth: Payer: Self-pay | Admitting: Internal Medicine

## 2013-04-09 NOTE — Telephone Encounter (Signed)
Pt has question about meds, would like to speak to Dr Hyman Hopes.

## 2013-04-22 ENCOUNTER — Telehealth: Payer: Self-pay | Admitting: Emergency Medicine

## 2013-04-22 NOTE — Telephone Encounter (Signed)
Pt called wanting to speak with Dr. Hyman Hopes regarding Clonidine dosage. Pt states script was wrote o take twice/ day but [facility] filled taking once daily. Pt states he took medicine bid and became very sleepy. Informed him to take 0.3 mg daily until clarified with Dr.Jegede. Also states he lost scripts for Glipizide and Pantroprazole. Will call meds in Pharm

## 2013-05-02 ENCOUNTER — Ambulatory Visit: Payer: Medicare Other

## 2013-06-18 ENCOUNTER — Telehealth: Payer: Self-pay | Admitting: Internal Medicine

## 2013-06-18 NOTE — Telephone Encounter (Signed)
[facility] called for refill request, per Dr. Hyman Hopes approved 2 refills for one month.

## 2013-09-30 ENCOUNTER — Encounter (HOSPITAL_COMMUNITY): Payer: Self-pay | Admitting: Emergency Medicine

## 2013-09-30 ENCOUNTER — Emergency Department (INDEPENDENT_AMBULATORY_CARE_PROVIDER_SITE_OTHER)
Admission: EM | Admit: 2013-09-30 | Discharge: 2013-09-30 | Disposition: A | Discharge status: Home / Self Care | Payer: Medicare Other | Source: Home / Self Care | Attending: Family Medicine | Admitting: Family Medicine

## 2013-09-30 DIAGNOSIS — K047 Periapical abscess without sinus: Secondary | ICD-10-CM | POA: Diagnosis not present

## 2013-09-30 MED ORDER — CLINDAMYCIN HCL 300 MG PO CAPS: 300.0000 mg | ORAL_CAPSULE | Freq: Three times a day (TID) | ORAL | Status: DC

## 2013-09-30 NOTE — ED Notes (Signed)
Per pt's request; called into [facility] off [address] Clindamycin 300 mg caps  1 tab po TID; qty 21; 0 refills.

## 2013-09-30 NOTE — ED Notes (Signed)
Pt c/o dental pain onset last night Sxs include facial swelling on right side Doesn't know if it's b/c he ate crab cake; he is allergic to shellfish Denies difficulty breathing and swallowing Alert w/no signs of acute distress.

## 2013-09-30 NOTE — Discharge Instructions (Signed)
Take medicine as prescribed, see your dentist as soon as possible, if no contact tonight then call office in am for appt.

## 2013-09-30 NOTE — ED Provider Notes (Signed)
CSN: [id number]     Arrival date & time 09/30/13  1557 History   First MD Initiated Contact with Patient 09/30/13 1654     Chief Complaint  Patient presents with  . Dental Pain     (Consider location/radiation/quality/duration/timing/severity/associated sxs/prior Treatment) Patient is a 58 y.o. male presenting with tooth pain. The history is provided by the patient and the spouse.  Dental Pain Location:  Upper Upper teeth location:  7/RU lateral incisor Quality:  Throbbing Severity:  Moderate Onset quality:  Sudden Progression:  Worsening Chronicity:  New Context: abscess, dental caries and poor dentition   Associated symptoms: facial pain, facial swelling and gum swelling   Risk factors: lack of dental care and periodontal disease     Past Medical History  Diagnosis Date  . Hypertension   . Diabetes mellitus without complication    Past Surgical History  Procedure Laterality Date  . Esophagogastroduodenoscopy N/A 03/22/2013    Procedure: ESOPHAGOGASTRODUODENOSCOPY (EGD);  Surgeon: Graylin ShiverSalem F Ganem, MD;  [facility];  Service: Endoscopy;  Laterality: N/A;   No family history on file. History  Substance Use Topics  . Smoking status: Never Smoker   . Smokeless tobacco: Not on file  . Alcohol Use: Yes    Review of Systems  Constitutional: Negative.   HENT: Positive for dental problem and facial swelling.   Respiratory: Negative for wheezing.       Allergies  Shellfish allergy and Codeine  Home Medications   Current Outpatient Rx  Name  Route  Sig  Dispense  Refill  . amLODipine (NORVASC) 10 MG tablet   Oral   Take 1 tablet (10 mg total) by mouth daily.   30 tablet   3   . aspirin 325 MG tablet   Oral   Take 1 tablet (325 mg total) by mouth daily.   30 tablet   3   . clindamycin (CLEOCIN) 300 MG capsule   Oral   Take 1 capsule (300 mg total) by mouth 3 (three) times daily.   21 capsule   0   . cloNIDine (CATAPRES) 0.3 MG tablet   Oral  Take 1 tablet (0.3 mg total) by mouth 2 (two) times daily.   60 tablet   3   . glipiZIDE (GLUCOTROL) 5 MG tablet   Oral   Take 1 tablet (5 mg total) by mouth daily with breakfast.   30 tablet   0   . lisinopril (PRINIVIL,ZESTRIL) 20 MG tablet   Oral   Take 1 tablet (20 mg total) by mouth daily.   30 tablet   3   . metoprolol (LOPRESSOR) 50 MG tablet   Oral   Take 1 tablet (50 mg total) by mouth 2 (two) times daily.   60 tablet   3   . Multiple Vitamin (MULTIVITAMIN WITH MINERALS) TABS tablet   Oral   Take 1 tablet by mouth daily.         Marland Kitchen. OVER THE COUNTER MEDICATION   Oral   Take 3 tablets by mouth every other day. "Nugenix" testosterone supplement         . pantoprazole (PROTONIX) 40 MG tablet   Oral   Take 1 tablet (40 mg total) by mouth 2 (two) times daily.   60 tablet   3    BP 174/101  Pulse 75  Temp(Src) 97.7 F (36.5 C) (Oral)  Resp 20  SpO2 97% Physical Exam  Nursing note and vitals reviewed. Constitutional: He is oriented to  person, place, and time. He appears well-developed and well-nourished. He appears distressed.  HENT:  Mouth/Throat: Oropharynx is clear and moist.    Neck: Normal range of motion. Neck supple.  Cardiovascular: Regular rhythm, normal heart sounds and intact distal pulses.   Pulmonary/Chest: Effort normal and breath sounds normal. He has no wheezes.  Neurological: He is alert and oriented to person, place, and time.  Skin: Skin is warm and dry.    ED Course  Procedures (including critical care time) Labs Review Labs Reviewed - No data to display Imaging Review No results found.    MDM  Dental abscess    Linna Hoff, MD 10/01/13 2012

## 2013-10-01 NOTE — ED Notes (Signed)
Call from patient, asking for name and number of oral surgeon to whom he was referred;  Called and spoke w spouse

## 2013-10-06 ENCOUNTER — Telehealth: Payer: Self-pay | Admitting: Internal Medicine

## 2013-10-06 ENCOUNTER — Ambulatory Visit: Payer: Medicare Other | Admitting: Internal Medicine

## 2013-10-06 ENCOUNTER — Other Ambulatory Visit: Payer: Self-pay | Admitting: Emergency Medicine

## 2013-10-06 DIAGNOSIS — E1110 Type 2 diabetes mellitus with ketoacidosis without coma: Secondary | ICD-10-CM

## 2013-10-06 DIAGNOSIS — I10 Essential (primary) hypertension: Secondary | ICD-10-CM

## 2013-10-06 DIAGNOSIS — K219 Gastro-esophageal reflux disease without esophagitis: Secondary | ICD-10-CM

## 2013-10-06 MED ORDER — GLIPIZIDE 5 MG PO TABS: 5.0000 mg | ORAL_TABLET | Freq: Every day | ORAL | Status: DC

## 2013-10-06 NOTE — Telephone Encounter (Signed)
Patient needs refill on glipiZIDE (GLUCOTROL) 5 MG tablet

## 2013-10-06 NOTE — Telephone Encounter (Signed)
Pt. Needs medication refill on glipiZIDE (GLUCOTROL) 5 MG tablet. Pt. Has doctor's appt on 3/9 at 12pm.

## 2013-10-06 NOTE — Telephone Encounter (Signed)
Pt. Needs prescription refill on glipiZIDE (GLUCOTROL) 5 MG tablet.

## 2013-10-06 NOTE — Telephone Encounter (Signed)
Medication ref[facility]

## 2013-10-20 ENCOUNTER — Encounter: Payer: Self-pay | Admitting: Internal Medicine

## 2013-10-20 ENCOUNTER — Telehealth: Payer: Self-pay | Admitting: Emergency Medicine

## 2013-10-20 ENCOUNTER — Ambulatory Visit: Payer: Medicare Other | Attending: Internal Medicine | Admitting: Internal Medicine

## 2013-10-20 VITALS — BP 166/79 | HR 71 | Temp 98.0°F | Resp 16 | Ht 75.0 in | Wt 333.0 lb

## 2013-10-20 DIAGNOSIS — E119 Type 2 diabetes mellitus without complications: Secondary | ICD-10-CM | POA: Insufficient documentation

## 2013-10-20 DIAGNOSIS — Z09 Encounter for follow-up examination after completed treatment for conditions other than malignant neoplasm: Secondary | ICD-10-CM | POA: Diagnosis not present

## 2013-10-20 DIAGNOSIS — E1110 Type 2 diabetes mellitus with ketoacidosis without coma: Secondary | ICD-10-CM

## 2013-10-20 DIAGNOSIS — K219 Gastro-esophageal reflux disease without esophagitis: Secondary | ICD-10-CM | POA: Diagnosis not present

## 2013-10-20 DIAGNOSIS — E1310 Other specified diabetes mellitus with ketoacidosis without coma: Secondary | ICD-10-CM

## 2013-10-20 DIAGNOSIS — Z7982 Long term (current) use of aspirin: Secondary | ICD-10-CM | POA: Diagnosis not present

## 2013-10-20 DIAGNOSIS — I10 Essential (primary) hypertension: Secondary | ICD-10-CM | POA: Insufficient documentation

## 2013-10-20 LAB — LIPID PANEL
CHOLESTEROL: 163 mg/dL (ref 0–200)
HDL: 46 mg/dL (ref >39)
LDL Cholesterol: 107 mg/dL — ABNORMAL HIGH (ref 0–99)
Total CHOL/HDL Ratio: 3.5 Ratio
Triglycerides: 52 mg/dL (ref <150)
VLDL: 10 mg/dL (ref 0–40)

## 2013-10-20 LAB — CBC WITH DIFFERENTIAL/PLATELET
BASOS ABS: 0 10*3/uL (ref 0.0–0.1)
Basophils Relative: 0 % (ref 0–1)
EOS ABS: 0.1 10*3/uL (ref 0.0–0.7)
EOS PCT: 2 % (ref 0–5)
HEMATOCRIT: 38.6 % — AB (ref 39.0–52.0)
Hemoglobin: 13.8 g/dL (ref 13.0–17.0)
Lymphocytes Relative: 30 % (ref 12–46)
Lymphs Abs: 1.8 10*3/uL (ref 0.7–4.0)
MCH: 28.6 pg (ref 26.0–34.0)
MCHC: 35.8 g/dL (ref 30.0–36.0)
MCV: 79.9 fL (ref 78.0–100.0)
Monocytes Absolute: 0.7 10*3/uL (ref 0.1–1.0)
Monocytes Relative: 11 % (ref 3–12)
Neutro Abs: 3.5 10*3/uL (ref 1.7–7.7)
Neutrophils Relative %: 57 % (ref 43–77)
PLATELETS: 234 10*3/uL (ref 150–400)
RBC: 4.83 MIL/uL (ref 4.22–5.81)
RDW: 15.1 % (ref 11.5–15.5)
WBC: 6.1 10*3/uL (ref 4.0–10.5)

## 2013-10-20 LAB — COMPLETE METABOLIC PANEL WITH GFR
ALT: 27 U/L (ref 0–53)
AST: 20 U/L (ref 0–37)
Albumin: 4.2 g/dL (ref 3.5–5.2)
Alkaline Phosphatase: 43 U/L (ref 39–117)
BUN: 12 mg/dL (ref 6–23)
CALCIUM: 9.5 mg/dL (ref 8.4–10.5)
CHLORIDE: 100 meq/L (ref 96–112)
CO2: 27 meq/L (ref 19–32)
CREATININE: 1.07 mg/dL (ref 0.50–1.35)
GFR, EST AFRICAN AMERICAN: 89 mL/min
GFR, Est Non African American: 77 mL/min
Glucose, Bld: 128 mg/dL — ABNORMAL HIGH (ref 70–99)
Potassium: 4.8 mEq/L (ref 3.5–5.3)
Sodium: 136 mEq/L (ref 135–145)
Total Bilirubin: 0.5 mg/dL (ref 0.2–1.2)
Total Protein: 7.6 g/dL (ref 6.0–8.3)

## 2013-10-20 LAB — GLUCOSE, POCT (MANUAL RESULT ENTRY): POC Glucose: 122 mg/dl — AB (ref 70–99)

## 2013-10-20 MED ORDER — METOPROLOL TARTRATE 50 MG PO TABS: 50.0000 mg | ORAL_TABLET | Freq: Two times a day (BID) | ORAL | Status: DC

## 2013-10-20 MED ORDER — GLIPIZIDE 5 MG PO TABS: 5.0000 mg | ORAL_TABLET | Freq: Every day | ORAL | Status: DC

## 2013-10-20 MED ORDER — AMLODIPINE BESYLATE 10 MG PO TABS: 10.0000 mg | ORAL_TABLET | Freq: Every day | ORAL | Status: DC

## 2013-10-20 MED ORDER — CLONIDINE HCL 0.3 MG PO TABS: 0.3000 mg | ORAL_TABLET | Freq: Two times a day (BID) | ORAL | Status: DC

## 2013-10-20 MED ORDER — LISINOPRIL 20 MG PO TABS: 20.0000 mg | ORAL_TABLET | Freq: Every day | ORAL | Status: DC

## 2013-10-20 MED ORDER — PANTOPRAZOLE SODIUM 40 MG PO TBEC: 40.0000 mg | DELAYED_RELEASE_TABLET | Freq: Two times a day (BID) | ORAL | Status: DC

## 2013-10-20 NOTE — Patient Instructions (Signed)
Hypertension  As your heart beats, it forces blood through your arteries. This force is your blood pressure. If the pressure is too high, it is called hypertension (HTN) or high blood pressure. HTN is dangerous because you may have it and not know it. High blood pressure may mean that your heart has to work harder to pump blood. Your arteries may be narrow or stiff. The extra work puts you at risk for heart disease, stroke, and other problems.   Blood pressure consists of two numbers, a higher number over a lower, 110/72, for example. It is stated as "110 over 72." The ideal is below 120 for the top number (systolic) and under 80 for the bottom (diastolic). Write down your blood pressure today.  You should pay close attention to your blood pressure if you have certain conditions such as:  · Heart failure.  · Prior heart attack.  · Diabetes  · Chronic kidney disease.  · Prior stroke.  · Multiple risk factors for heart disease.  To see if you have HTN, your blood pressure should be measured while you are seated with your arm held at the level of the heart. It should be measured at least twice. A one-time elevated blood pressure reading (especially in the Emergency Department) does not mean that you need treatment. There may be conditions in which the blood pressure is different between your right and left arms. It is important to see your caregiver soon for a recheck.  Most people have essential hypertension which means that there is not a specific cause. This type of high blood pressure may be lowered by changing lifestyle factors such as:  · Stress.  · Smoking.  · Lack of exercise.  · Excessive weight.  · Drug/tobacco/alcohol use.  · Eating less salt.  Most people do not have symptoms from high blood pressure until it has caused damage to the body. Effective treatment can often prevent, delay or reduce that damage.  TREATMENT   When a cause has been identified, treatment for high blood pressure is directed at the  cause. There are a large number of medications to treat HTN. These fall into several categories, and your caregiver will help you select the medicines that are best for you. Medications may have side effects. You should review side effects with your caregiver.  If your blood pressure stays high after you have made lifestyle changes or started on medicines,   · Your medication(s) may need to be changed.  · Other problems may need to be addressed.  · Be certain you understand your prescriptions, and know how and when to take your medicine.  · Be sure to follow up with your caregiver within the time frame advised (usually within two weeks) to have your blood pressure rechecked and to review your medications.  · If you are taking more than one medicine to lower your blood pressure, make sure you know how and at what times they should be taken. Taking two medicines at the same time can result in blood pressure that is too low.  SEEK IMMEDIATE MEDICAL CARE IF:  · You develop a severe headache, blurred or changing vision, or confusion.  · You have unusual weakness or numbness, or a faint feeling.  · You have severe chest or abdominal pain, vomiting, or breathing problems.  MAKE SURE YOU:   · Understand these instructions.  · Will watch your condition.  · Will get help right away if you are not doing well   or get worse.  Document Released: 07/31/2005 Document Revised: 10/23/2011 Document Reviewed: 03/20/2008  ExitCare® Patient Information ©2014 [employer].    Diabetes and Exercise  Exercising regularly is important. It is not just about losing weight. It has many health benefits, such as:  · Improving your overall fitness, flexibility, and endurance.  · Increasing your bone density.  · Helping with weight control.  · Decreasing your body fat.  · Increasing your muscle strength.  · Reducing stress and tension.  · Improving your overall health.  People with diabetes who exercise gain additional benefits because  exercise:  · Reduces appetite.  · Improves the body's use of blood sugar (glucose).  · Helps lower or control blood glucose.  · Decreases blood pressure.  · Helps control blood lipids (such as cholesterol and triglycerides).  · Improves the body's use of the hormone insulin by:  · Increasing the body's insulin sensitivity.  · Reducing the body's insulin needs.  · Decreases the risk for heart disease because exercising:  · Lowers cholesterol and triglycerides levels.  · Increases the levels of good cholesterol (such as high-density lipoproteins [HDL]) in the body.  · Lowers blood glucose levels.  YOUR ACTIVITY PLAN   Choose an activity that you enjoy and set realistic goals. Your health care provider or diabetes educator can help you make an activity plan that works for you. You can break activities into 2 or 3 sessions throughout the day. Doing so is as good as one long session. Exercise ideas include:  · Taking the dog for a walk.  · Taking the stairs instead of the elevator.  · Dancing to your favorite song.  · Doing your favorite exercise with a friend.  RECOMMENDATIONS FOR EXERCISING WITH TYPE 1 OR TYPE 2 DIABETES   · Check your blood glucose before exercising. If blood glucose levels are greater than 240 mg/dL, check for urine ketones. Do not exercise if ketones are present.  · Avoid injecting insulin into areas of the body that are going to be exercised. For example, avoid injecting insulin into:  · The arms when playing tennis.  · The legs when jogging.  · Keep a record of:  · Food intake before and after you exercise.  · Expected peak times of insulin action.  · Blood glucose levels before and after you exercise.  · The type and amount of exercise you have done.  · Review your records with your health care provider. Your health care provider will help you to develop guidelines for adjusting food intake and insulin amounts before and after exercising.  · If you take insulin or oral hypoglycemic agents, watch  for signs and symptoms of hypoglycemia. They include:  · Dizziness.  · Shaking.  · Sweating.  · Chills.  · Confusion.  · Drink plenty of water while you exercise to prevent dehydration or heat stroke. Body water is lost during exercise and must be replaced.  · Talk to your health care provider before starting an exercise program to make sure it is safe for you. Remember, almost any type of activity is better than none.  Document Released: 10/21/2003 Document Revised: 04/02/2013 Document Reviewed: 01/07/2013  ExitCare® Patient Information ©2014 [employer].

## 2013-10-20 NOTE — Telephone Encounter (Signed)
Pt given scheduled Echo 10/28/13 @ 1045 amd [facility]

## 2013-10-20 NOTE — Progress Notes (Signed)
Pt is here following up on his diabetes. Pt is needing his medications refilled.

## 2013-10-21 LAB — URINALYSIS, COMPLETE
BILIRUBIN URINE: NEGATIVE
Bacteria, UA: NONE SEEN
CRYSTALS: NONE SEEN
Casts: NONE SEEN
Glucose, UA: NEGATIVE mg/dL
HGB URINE DIPSTICK: NEGATIVE
Ketones, ur: NEGATIVE mg/dL
Leukocytes, UA: NEGATIVE
Nitrite: NEGATIVE
Protein, ur: NEGATIVE mg/dL
SPECIFIC GRAVITY, URINE: 1.013 (ref 1.005–1.030)
Squamous Epithelial / LPF: NONE SEEN
Urobilinogen, UA: 0.2 mg/dL (ref 0.0–1.0)
pH: 5.5 (ref 5.0–8.0)

## 2013-10-21 LAB — MICROALBUMIN / CREATININE URINE RATIO
Creatinine, Urine: 113.6 mg/dL
Microalb Creat Ratio: 13.6 mg/g (ref 0.0–30.0)
Microalb, Ur: 1.54 mg/dL (ref 0.00–1.89)

## 2013-10-21 LAB — HEMOGLOBIN A1C
Hgb A1c MFr Bld: 7.6 % — ABNORMAL HIGH (ref <5.7)
MEAN PLASMA GLUCOSE: 171 mg/dL — AB (ref <117)

## 2013-10-24 NOTE — Progress Notes (Signed)
Patient ID: Anthony MassyCharles E Barnes, male   DOB: 13-Dec-1955, 58 y.o.   MRN: [id number]   Anthony Barnes, is a 58 y.o. male  [id number]  [id number]  DOB - 13-Dec-1955  Chief Complaint  Patient presents with  . Follow-up        Subjective:   Anthony Barnes is a 58 y.o. male here today for a follow up visit. Patient has history of hypertension or diabetes here for medication refill and also for blood test. He has no significant complaint today. He claims compliant with medication. He is aware of foot care. Last hemoglobin A1c was 7.7%. Patient is on glipizide 5 mg by mouth daily. Blood pressure is controlled at home per patient. He does not smoke cigarette, he does not drink alcohol. Patient has No headache, No chest pain, No abdominal pain - No Nausea, No new weakness tingling or numbness, No Cough - SOB.  No problems updated.  ALLERGIES: Allergies  Allergen Reactions  . Shellfish Allergy Anaphylaxis  . Codeine Itching    unknown    PAST MEDICAL HISTORY: Past Medical History  Diagnosis Date  . Hypertension   . Diabetes mellitus without complication     MEDICATIONS AT HOME: Prior to Admission medications   Medication Sig Start Date End Date Taking? Authorizing Provider  amLODipine (NORVASC) 10 MG tablet Take 1 tablet (10 mg total) by mouth daily. 10/20/13  Yes Jeanann Lewandowskylugbemiga Keanthony Poole, MD  aspirin 325 MG tablet Take 1 tablet (325 mg total) by mouth daily. 04/01/13  Yes Jeanann Lewandowskylugbemiga Jatin Naumann, MD  cloNIDine (CATAPRES) 0.3 MG tablet Take 1 tablet (0.3 mg total) by mouth 2 (two) times daily. 10/20/13  Yes Jeanann Lewandowskylugbemiga Traves Majchrzak, MD  glipiZIDE (GLUCOTROL) 5 MG tablet Take 1 tablet (5 mg total) by mouth daily with breakfast. 10/20/13  Yes Jeanann Lewandowskylugbemiga Nicolae Vasek, MD  lisinopril (PRINIVIL,ZESTRIL) 20 MG tablet Take 1 tablet (20 mg total) by mouth daily. 10/20/13  Yes Jeanann Lewandowskylugbemiga Kaleena Corrow, MD  metoprolol (LOPRESSOR) 50 MG tablet Take 1 tablet (50 mg total) by mouth 2 (two) times daily. 10/20/13  Yes Jeanann Lewandowskylugbemiga Jarome Trull,  MD  Multiple Vitamin (MULTIVITAMIN WITH MINERALS) TABS tablet Take 1 tablet by mouth daily.   Yes Historical Provider, MD  OVER THE COUNTER MEDICATION Take 3 tablets by mouth every other day. "Nugenix" testosterone supplement   Yes Historical Provider, MD  pantoprazole (PROTONIX) 40 MG tablet Take 1 tablet (40 mg total) by mouth 2 (two) times daily. 10/20/13  Yes Jeanann Lewandowskylugbemiga Oliveah Zwack, MD  clindamycin (CLEOCIN) 300 MG capsule Take 1 capsule (300 mg total) by mouth 3 (three) times daily. 09/30/13   Linna HoffJames D Kindl, MD     Objective:   Filed Vitals:   10/20/13 1216  BP: 166/79  Pulse: 71  Temp: 98 F (36.7 C)  TempSrc: Oral  Resp: 16  Height: 6\' 3"  (1.905 m)  Weight: 333 lb (151.048 kg)  SpO2: 97%    Exam General appearance : Awake, alert, not in any distress. Speech Clear. Not toxic looking HEENT: Atraumatic and Normocephalic, pupils equally reactive to light and accomodation Neck: supple, no JVD. No cervical lymphadenopathy.  Chest:Good air entry bilaterally, no added sounds  CVS: S1 S2 regular, no murmurs.  Abdomen: Bowel sounds present, Non tender and not distended with no gaurding, rigidity or rebound. Extremities: B/L Lower Ext shows no edema, both legs are warm to touch Neurology: Awake alert, and oriented X 3, CN II-XII intact, Non focal Skin:No Rash Wounds:N/A  Data Review Lab Results  Component Value Date   HGBA1C  7.6* 10/20/2013   HGBA1C 7.7* 03/21/2013     Assessment & Plan   1. Diabetes  - Glucose (CBG) - CBC with Differential - COMPLETE METABOLIC PANEL WITH GFR - POCT glycosylated hemoglobin (Hb A1C) - Lipid panel - Urinalysis, Complete - Microalbumin/Creatinine Ratio, Urine - Microalbumin, urine  2. Essential hypertension, benign   - amLODipine (NORVASC) 10 MG tablet; Take 1 tablet (10 mg total) by mouth daily.  Dispense: 90 tablet; Refill: 3 - cloNIDine (CATAPRES) 0.3 MG tablet; Take 1 tablet (0.3 mg total) by mouth 2 (two) times daily.  Dispense: 180  tablet; Refill: 3 - lisinopril (PRINIVIL,ZESTRIL) 20 MG tablet; Take 1 tablet (20 mg total) by mouth daily.  Dispense: 90 tablet; Refill: 3 - metoprolol (LOPRESSOR) 50 MG tablet; Take 1 tablet (50 mg total) by mouth 2 (two) times daily.  Dispense: 180 tablet; Refill: 3  - 2D Echocardiogram with contrast; Future  3. GERD (gastroesophageal reflux disease)  - pantoprazole (PROTONIX) 40 MG tablet; Take 1 tablet (40 mg total) by mouth 2 (two) times daily.  Dispense: 180 tablet; Refill: 3  4. DM (diabetes mellitus) type 2, uncontrolled, with ketoacidosis Continue - glipiZIDE (GLUCOTROL) 5 MG tablet; Take 1 tablet (5 mg total) by mouth daily with breakfast.  Dispense: 90 tablet; Refill: 3 Patient was extensively counseled on nutrition and exercise  Return in about 3 months (around 01/20/2014) for Hemoglobin A1C and Follow up, DM.  The patient was given clear instructions to go to ER or return to medical center if symptoms don't improve, worsen or new problems develop. The patient verbalized understanding. The patient was told to call to get lab results if they haven't heard anything in the next week.   This note has been created with Education officer, environmental. Any transcriptional errors are unintentional.    Jeanann Lewandowsky, MD, MHA, FACP, FAAP [facility] [place], [place] [phone]   10/24/2013, 8:22 PM

## 2013-10-28 ENCOUNTER — Ambulatory Visit (HOSPITAL_COMMUNITY)
Admission: RE | Admit: 2013-10-28 | Discharge: 2013-10-28 | Disposition: A | Discharge status: Home / Self Care | Payer: Medicare Other | Source: Ambulatory Visit | Attending: Internal Medicine | Admitting: Internal Medicine

## 2013-10-28 DIAGNOSIS — K219 Gastro-esophageal reflux disease without esophagitis: Secondary | ICD-10-CM | POA: Diagnosis not present

## 2013-10-28 DIAGNOSIS — I079 Rheumatic tricuspid valve disease, unspecified: Secondary | ICD-10-CM | POA: Diagnosis not present

## 2013-10-28 DIAGNOSIS — I10 Essential (primary) hypertension: Secondary | ICD-10-CM

## 2013-10-28 DIAGNOSIS — E119 Type 2 diabetes mellitus without complications: Secondary | ICD-10-CM | POA: Insufficient documentation

## 2013-10-28 DIAGNOSIS — I517 Cardiomegaly: Secondary | ICD-10-CM

## 2013-10-28 DIAGNOSIS — I059 Rheumatic mitral valve disease, unspecified: Secondary | ICD-10-CM | POA: Diagnosis not present

## 2013-10-28 DIAGNOSIS — I509 Heart failure, unspecified: Secondary | ICD-10-CM | POA: Insufficient documentation

## 2013-10-28 NOTE — Progress Notes (Signed)
*  PRELIMINARY RESULTS* Echocardiogram 2D Echocardiogram has been performed.  Jeryl ColumbiaLLIOTT, [place] Kiny[place] 10/28/2013, 12:28 PM

## 2013-11-10 ENCOUNTER — Telehealth: Payer: Self-pay | Admitting: *Deleted

## 2013-11-10 NOTE — Telephone Encounter (Signed)
Pt is aware of his lab results. 

## 2014-01-20 ENCOUNTER — Ambulatory Visit: Payer: Medicare Other | Admitting: Internal Medicine

## 2014-08-10 ENCOUNTER — Other Ambulatory Visit: Payer: Self-pay | Admitting: Internal Medicine

## 2014-10-16 ENCOUNTER — Encounter: Payer: Self-pay | Admitting: Internal Medicine

## 2014-10-16 ENCOUNTER — Ambulatory Visit: Payer: Medicare Other | Attending: Internal Medicine | Admitting: Internal Medicine

## 2014-10-16 VITALS — BP 155/94 | HR 65 | Temp 98.0°F | Resp 16 | Wt 329.0 lb

## 2014-10-16 DIAGNOSIS — E119 Type 2 diabetes mellitus without complications: Secondary | ICD-10-CM | POA: Diagnosis not present

## 2014-10-16 DIAGNOSIS — I10 Essential (primary) hypertension: Secondary | ICD-10-CM | POA: Insufficient documentation

## 2014-10-16 DIAGNOSIS — Z125 Encounter for screening for malignant neoplasm of prostate: Secondary | ICD-10-CM

## 2014-10-16 DIAGNOSIS — K219 Gastro-esophageal reflux disease without esophagitis: Secondary | ICD-10-CM | POA: Insufficient documentation

## 2014-10-16 LAB — POCT GLYCOSYLATED HEMOGLOBIN (HGB A1C): HEMOGLOBIN A1C: 8

## 2014-10-16 LAB — GLUCOSE, POCT (MANUAL RESULT ENTRY): POC GLUCOSE: 122 mg/dL — AB (ref 70–99)

## 2014-10-16 MED ORDER — AMLODIPINE BESYLATE 10 MG PO TABS: 10.0000 mg | ORAL_TABLET | Freq: Every day | ORAL | Status: DC

## 2014-10-16 MED ORDER — LISINOPRIL 20 MG PO TABS: 20.0000 mg | ORAL_TABLET | Freq: Every day | ORAL | Status: DC

## 2014-10-16 MED ORDER — GLIPIZIDE 5 MG PO TABS: 5.0000 mg | ORAL_TABLET | Freq: Every day | ORAL | Status: DC

## 2014-10-16 MED ORDER — CLONIDINE HCL 0.3 MG PO TABS: 0.3000 mg | ORAL_TABLET | Freq: Two times a day (BID) | ORAL | Status: DC

## 2014-10-16 MED ORDER — PANTOPRAZOLE SODIUM 40 MG PO TBEC: 40.0000 mg | DELAYED_RELEASE_TABLET | Freq: Two times a day (BID) | ORAL | Status: DC

## 2014-10-16 NOTE — Progress Notes (Signed)
Patient ID: Anthony MassyCharles E Ausborn, male   DOB: 07/04/1956, 59 y.o.   MRN: [id number] 1. HTN: Medication: out of medication for 2 months Home BP monitoring:does not check  Positive ROS edema Negative WUJ:[id number]:headaches, chest pain, palpitations.    2. DM2:  Medication: States that he has been out of his Diabetes medication for 4 months. Was managed only on glipizide Home CBG monitoring: does not check  Hypoglycemic event: none, does not know symptoms Positive ROS polyuria, tingling in feet Negative NFA:OZHYQMVROS:blurred vision, dizzy, polydipsia.   Social History reviewed: Smoker never Exercise not currently    Physical Exam  Constitutional: He is oriented to person, place, and time.  Neck: No JVD present.  Cardiovascular: Normal rate, regular rhythm and normal heart sounds.   Pulmonary/Chest: Effort normal and breath sounds normal.  Musculoskeletal: He exhibits no edema.  Neurological: He is alert and oriented to person, place, and time.  Skin: Skin is warm and dry.    Leonette MostCharles was seen today for follow-up.  Diagnoses and all orders for this visit:  Type 2 diabetes mellitus without complication Orders: -     Glucose (CBG) -     HgB A1c -     glipiZIDE (GLUCOTROL) 5 MG tablet; Take 1 tablet (5 mg total) by mouth daily with breakfast. -     Microalbumin, urine -     Lipid panel; Future Needs improvement but a1c is not too bad. Will continue to monitor patient  Essential hypertension, benign Orders: -     amLODipine (NORVASC) 10 MG tablet; Take 1 tablet (10 mg total) by mouth daily. -     cloNIDine (CATAPRES) 0.3 MG tablet; Take 1 tablet (0.3 mg total) by mouth 2 (two) times daily. -     lisinopril (PRINIVIL,ZESTRIL) 20 MG tablet; Take 1 tablet (20 mg total) by mouth daily. -     CBC; Future -     COMPLETE METABOLIC PANEL WITH GFR; Future Will d/c metoprolol due to low heart rate and he has been off the medication for several months  Gastroesophageal reflux disease without  esophagitis Orders: -    Refill pantoprazole (PROTONIX) 40 MG tablet; Take 1 tablet (40 mg total) by mouth 2 (two) times daily.  Prostate cancer screening Orders: -     PSA, Medicare; Future  Return in about 1 week (around 10/23/2014) for Lab Visit and RN visit for BP check, 6 mo PCP [place].   Holland CommonsKECK, Abdelrahman Nair, NP 10/26/2014 5:57 PM

## 2014-10-16 NOTE — Progress Notes (Signed)
Patient here for follow up on his diabetes and for medication refills 

## 2014-10-16 NOTE — Patient Instructions (Signed)
Diabetes and Standards of Medical Care Diabetes is complicated. You may find that your diabetes team includes a dietitian, nurse, diabetes educator, eye doctor, and more. To help everyone know what is going on and to help you get the care you deserve, the following schedule of care was developed to help keep you on track. Below are the tests, exams, vaccines, medicines, education, and plans you will need. HbA1c test This test shows how well you have controlled your glucose over the past 2-3 months. It is used to see if your diabetes management plan needs to be adjusted.   It is performed at least 2 times a year if you are meeting treatment goals.  It is performed 4 times a year if therapy has changed or if you are not meeting treatment goals. Blood pressure test  This test is performed at every routine medical visit. The goal is less than 140/90 mm Hg for most people, but 130/80 mm Hg in some cases. Ask your health care provider about your goal. Dental exam  Follow up with the dentist regularly. Eye exam  If you are diagnosed with type 1 diabetes as a child, get an exam upon reaching the age of 57 years or older and have had diabetes for 3-5 years. Yearly eye exams are recommended after that initial eye exam.  If you are diagnosed with type 1 diabetes as an adult, get an exam within 5 years of diagnosis and then yearly.  If you are diagnosed with type 2 diabetes, get an exam as soon as possible after the diagnosis and then yearly. Foot care exam  Visual foot exams are performed at every routine medical visit. The exams check for cuts, injuries, or other problems with the feet.  A comprehensive foot exam should be done yearly. This includes visual inspection as well as assessing foot pulses and testing for loss of sensation.  Check your feet nightly for cuts, injuries, or other problems with your feet. Tell your health care provider if anything is not healing. Kidney function test (urine  microalbumin)  This test is performed once a year.  Type 1 diabetes: The first test is performed 5 years after diagnosis.  Type 2 diabetes: The first test is performed at the time of diagnosis.  A serum creatinine and estimated glomerular filtration rate (eGFR) test is done once a year to assess the level of chronic kidney disease (CKD), if present. Lipid profile (cholesterol, HDL, LDL, triglycerides)  Performed every 5 years for most people.  The goal for LDL is less than 100 mg/dL. If you are at high risk, the goal is less than 70 mg/dL.  The goal for HDL is 40 mg/dL-50 mg/dL for men and 50 mg/dL-60 mg/dL for women. An HDL cholesterol of 60 mg/dL or higher gives some protection against heart disease.  The goal for triglycerides is less than 150 mg/dL. Influenza vaccine, pneumococcal vaccine, and hepatitis B vaccine  The influenza vaccine is recommended yearly.  It is recommended that people with diabetes who are over 24 years old get the pneumonia vaccine. In some cases, two separate shots may be given. Ask your health care provider if your pneumonia vaccination is up to date.  The hepatitis B vaccine is also recommended for adults with diabetes. Diabetes self-management education  Education is recommended at diagnosis and ongoing as needed. Treatment plan  Your treatment plan is reviewed at every medical visit. Document Released: 05/28/2009 Document Revised: 12/15/2013 Document Reviewed: 12/31/2012 [facility] Patient Information 2015 [place]. This information is not intended to replace advice given to you by your health care provider. Make sure you discuss any questions you have with your health care provider.  

## 2014-10-17 LAB — MICROALBUMIN, URINE: Microalb, Ur: 1.5 mg/dL (ref <2.0)

## 2014-11-06 ENCOUNTER — Ambulatory Visit: Payer: Medicare Other

## 2014-11-06 ENCOUNTER — Ambulatory Visit: Payer: Medicare Other | Attending: Internal Medicine | Admitting: *Deleted

## 2014-11-06 VITALS — BP 150/94 | HR 75 | Temp 98.0°F | Resp 14

## 2014-11-06 DIAGNOSIS — I10 Essential (primary) hypertension: Secondary | ICD-10-CM

## 2014-11-06 DIAGNOSIS — Z125 Encounter for screening for malignant neoplasm of prostate: Secondary | ICD-10-CM | POA: Diagnosis not present

## 2014-11-06 DIAGNOSIS — E119 Type 2 diabetes mellitus without complications: Secondary | ICD-10-CM | POA: Diagnosis not present

## 2014-11-06 LAB — CBC
HEMATOCRIT: 43 % (ref 39.0–52.0)
HEMOGLOBIN: 14.7 g/dL (ref 13.0–17.0)
MCH: 28.4 pg (ref 26.0–34.0)
MCHC: 34.2 g/dL (ref 30.0–36.0)
MCV: 83.2 fL (ref 78.0–100.0)
MPV: 10.8 fL (ref 8.6–12.4)
Platelets: 227 10*3/uL (ref 150–400)
RBC: 5.17 MIL/uL (ref 4.22–5.81)
RDW: 15 % (ref 11.5–15.5)
WBC: 6.6 10*3/uL (ref 4.0–10.5)

## 2014-11-06 LAB — LIPID PANEL
CHOL/HDL RATIO: 3 ratio
Cholesterol: 153 mg/dL (ref 0–200)
HDL: 51 mg/dL (ref >=40)
LDL CALC: 91 mg/dL (ref 0–99)
Triglycerides: 55 mg/dL (ref <150)
VLDL: 11 mg/dL (ref 0–40)

## 2014-11-06 LAB — COMPLETE METABOLIC PANEL WITH GFR
ALBUMIN: 4.3 g/dL (ref 3.5–5.2)
ALK PHOS: 46 U/L (ref 39–117)
ALT: 31 U/L (ref 0–53)
AST: 19 U/L (ref 0–37)
BILIRUBIN TOTAL: 0.4 mg/dL (ref 0.2–1.2)
BUN: 14 mg/dL (ref 6–23)
CO2: 27 mEq/L (ref 19–32)
CREATININE: 1.01 mg/dL (ref 0.50–1.35)
Calcium: 10 mg/dL (ref 8.4–10.5)
Chloride: 101 mEq/L (ref 96–112)
GFR, EST NON AFRICAN AMERICAN: 82 mL/min
GFR, Est African American: >89 mL/min
Glucose, Bld: 162 mg/dL — ABNORMAL HIGH (ref 70–99)
Potassium: 4.7 mEq/L (ref 3.5–5.3)
Sodium: 137 mEq/L (ref 135–145)
TOTAL PROTEIN: 7.5 g/dL (ref 6.0–8.3)

## 2014-11-06 MED ORDER — LISINOPRIL 20 MG PO TABS: 40.0000 mg | ORAL_TABLET | Freq: Every day | ORAL | Status: DC

## 2014-11-06 NOTE — Progress Notes (Signed)
Patient presents for fasting lab and BP check Med list reviewed; states taking all meds as directed with no missed doses Discussed need for low sodium diet and using Mrs. Dash as alternative to salt Patient states he uses a lot of soy sauce to flavor foods. Encouraged to choose foods with 5% or less of daily value for sodium.  Discussed walking 30 minutes per day for exercise starting in 10 minute increments Patient denies headaches, blurred vision, SHOB, chest pain or pressure  BP 160/110  left arm manually with large cuff Patient states he took all 4 BP meds 1 hour ago  P 75  R  14 T  98.0oral SPO2  95%  Per Provider: Recheck BP after patient sitting for 20 minutes  BP recheck 150/94  left arm manually with large cuff after sitting 20 minutes  Per provider: Increase lisinopril to 40 mg daily Schedule appt with PCP in 2-3 weeks  Patient advised to call for med refills at least 7 days before running out so as not to go without.  Patient given literature on DASH Eating Plan

## 2014-11-06 NOTE — Patient Instructions (Signed)
DASH Eating Plan °DASH stands for "Dietary Approaches to Stop Hypertension." The DASH eating plan is a healthy eating plan that has been shown to reduce high blood pressure (hypertension). Additional health benefits may include reducing the risk of type 2 diabetes mellitus, heart disease, and stroke. The DASH eating plan may also help with weight loss. °WHAT DO I NEED TO KNOW ABOUT THE DASH EATING PLAN? °For the DASH eating plan, you will follow these general guidelines: °· Choose foods with a percent daily value for sodium of less than 5% (as listed on the food label). °· Use salt-free seasonings or herbs instead of table salt or sea salt. °· Check with your health care provider or pharmacist before using salt substitutes. °· Eat lower-sodium products, often labeled as "lower sodium" or "no salt added." °· Eat fresh foods. °· Eat more vegetables, fruits, and low-fat dairy products. °· Choose whole grains. Look for the word "whole" as the first word in the ingredient list. °· Choose fish and skinless chicken or turkey more often than red meat. Limit fish, poultry, and meat to 6 oz (170 g) each day. °· Limit sweets, desserts, sugars, and sugary drinks. °· Choose heart-healthy fats. °· Limit cheese to 1 oz (28 g) per day. °· Eat more home-cooked food and less restaurant, buffet, and fast food. °· Limit fried foods. °· Cook foods using methods other than frying. °· Limit canned vegetables. If you do use them, rinse them well to decrease the sodium. °· When eating at a restaurant, ask that your food be prepared with less salt, or no salt if possible. °WHAT FOODS CAN I EAT? °Seek help from a dietitian for individual calorie needs. °Grains °Whole grain or whole wheat bread. Brown rice. Whole grain or whole wheat pasta. Quinoa, bulgur, and whole grain cereals. Low-sodium cereals. Corn or whole wheat flour tortillas. Whole grain cornbread. Whole grain crackers. Low-sodium crackers. °Vegetables °Fresh or frozen vegetables  (raw, steamed, roasted, or grilled). Low-sodium or reduced-sodium tomato and vegetable juices. Low-sodium or reduced-sodium tomato sauce and paste. Low-sodium or reduced-sodium canned vegetables.  °Fruits °All fresh, canned (in natural juice), or frozen fruits. °Meat and Other Protein Products °Ground beef (85% or leaner), grass-fed beef, or beef trimmed of fat. Skinless chicken or turkey. Ground chicken or turkey. Pork trimmed of fat. All fish and seafood. Eggs. Dried beans, peas, or lentils. Unsalted nuts and seeds. Unsalted canned beans. °Dairy °Low-fat dairy products, such as skim or 1% milk, 2% or reduced-fat cheeses, low-fat ricotta or cottage cheese, or plain low-fat yogurt. Low-sodium or reduced-sodium cheeses. °Fats and Oils °Tub margarines without trans fats. Light or reduced-fat mayonnaise and salad dressings (reduced sodium). Avocado. Safflower, olive, or canola oils. Natural peanut or almond butter. °Other °Unsalted popcorn and pretzels. °The items listed above may not be a complete list of recommended foods or beverages. Contact your dietitian for more options. °WHAT FOODS ARE NOT RECOMMENDED? °Grains °White bread. White pasta. White rice. Refined cornbread. Bagels and croissants. Crackers that contain trans fat. °Vegetables °Creamed or fried vegetables. Vegetables in a cheese sauce. Regular canned vegetables. Regular canned tomato sauce and paste. Regular tomato and vegetable juices. °Fruits °Dried fruits. Canned fruit in light or heavy syrup. Fruit juice. °Meat and Other Protein Products °Fatty cuts of meat. Ribs, chicken wings, bacon, sausage, bologna, salami, chitterlings, fatback, hot dogs, bratwurst, and packaged luncheon meats. Salted nuts and seeds. Canned beans with salt. °Dairy °Whole or 2% milk, cream, half-and-half, and cream cheese. Whole-fat or sweetened yogurt. Full-fat   cheeses or blue cheese. Nondairy creamers and whipped toppings. Processed cheese, cheese spreads, or cheese  curds. °Condiments °Onion and garlic salt, seasoned salt, table salt, and sea salt. Canned and packaged gravies. Worcestershire sauce. Tartar sauce. Barbecue sauce. Teriyaki sauce. Soy sauce, including reduced sodium. Steak sauce. Fish sauce. Oyster sauce. Cocktail sauce. Horseradish. Ketchup and mustard. Meat flavorings and tenderizers. Bouillon cubes. Hot sauce. Tabasco sauce. Marinades. Taco seasonings. Relishes. °Fats and Oils °Butter, stick margarine, lard, shortening, ghee, and bacon fat. Coconut, palm kernel, or palm oils. Regular salad dressings. °Other °Pickles and olives. Salted popcorn and pretzels. °The items listed above may not be a complete list of foods and beverages to avoid. Contact your dietitian for more information. °WHERE CAN I FIND MORE INFORMATION? °[employer], [employer], [employer]: www.nhlbi.nih.gov/health/health-topics/topics/dash/ °Document Released: 07/20/2011 Document Revised: 12/15/2013 Document Reviewed: 06/04/2013 °Exit[employer]® Patient Information ©2015 [employer]. This information is not intended to replace advice given to you by your health care provider. Make sure you discuss any questions you have with your health care provider. ° °

## 2014-11-07 LAB — PSA, MEDICARE: PSA: 0.61 ng/mL (ref <=4.00)

## 2014-11-11 ENCOUNTER — Other Ambulatory Visit: Payer: Self-pay | Admitting: Internal Medicine

## 2014-11-16 ENCOUNTER — Telehealth: Payer: Self-pay | Admitting: *Deleted

## 2014-11-16 NOTE — Telephone Encounter (Signed)
Left message with wife for pt to return my call. 

## 2014-11-16 NOTE — Telephone Encounter (Signed)
-----   Message from Ambrose FinlandValerie A Keck, NP sent at 11/09/2014  6:18 PM EDT ----- Labs are within normal limits

## 2014-11-20 ENCOUNTER — Telehealth: Payer: Self-pay | Admitting: Internal Medicine

## 2014-11-20 NOTE — Telephone Encounter (Signed)
Pt states that the indicated directions for lisinopril will result in him running out of medication before next refill is available to be dispensed. Please f/u with pt with further instructions.

## 2014-11-23 ENCOUNTER — Ambulatory Visit: Payer: Medicare Other | Admitting: Internal Medicine

## 2014-12-07 ENCOUNTER — Encounter: Payer: Self-pay | Admitting: Internal Medicine

## 2014-12-07 ENCOUNTER — Ambulatory Visit: Payer: Medicare Other | Attending: Internal Medicine | Admitting: Internal Medicine

## 2014-12-07 VITALS — BP 157/97 | HR 64 | Temp 98.4°F | Resp 16 | Ht 76.0 in | Wt 330.0 lb

## 2014-12-07 DIAGNOSIS — Z1211 Encounter for screening for malignant neoplasm of colon: Secondary | ICD-10-CM | POA: Diagnosis not present

## 2014-12-07 DIAGNOSIS — I10 Essential (primary) hypertension: Secondary | ICD-10-CM | POA: Diagnosis not present

## 2014-12-07 DIAGNOSIS — E1110 Type 2 diabetes mellitus with ketoacidosis without coma: Secondary | ICD-10-CM

## 2014-12-07 DIAGNOSIS — E119 Type 2 diabetes mellitus without complications: Secondary | ICD-10-CM | POA: Diagnosis not present

## 2014-12-07 DIAGNOSIS — E1310 Other specified diabetes mellitus with ketoacidosis without coma: Secondary | ICD-10-CM | POA: Diagnosis not present

## 2014-12-07 DIAGNOSIS — Z87891 Personal history of nicotine dependence: Secondary | ICD-10-CM | POA: Insufficient documentation

## 2014-12-07 LAB — GLUCOSE, POCT (MANUAL RESULT ENTRY): POC GLUCOSE: 141 mg/dL — AB (ref 70–99)

## 2014-12-07 MED ORDER — GLUCOSE BLOOD VI STRP: ORAL_STRIP | Status: DC

## 2014-12-07 MED ORDER — FREESTYLE LANCETS MISC
Status: DC
Start: 2014-12-07 — End: 2016-03-13

## 2014-12-07 MED ORDER — LISINOPRIL 40 MG PO TABS: 40.0000 mg | ORAL_TABLET | Freq: Every day | ORAL | Status: DC

## 2014-12-07 NOTE — Progress Notes (Signed)
Patient ID: Anthony Barnes, male   DOB: 04-07-1956, 59 y.o.   MRN: [id number]   Amy Belloso, is a 59 y.o. male  [id number]  [id number]  DOB - [birth year]  Chief Complaint  Patient presents with  . Follow-up        Subjective:   Anthony Barnes is a 59 y.o. male here today for a follow up visit. Patient has history of hypertension and diabetes mellitus without complications. Patient is here for routine follow-up. There is no new complaint today but blood pressure is slightly elevated, patient has not taken his medication today. Patient reports no side effects to medications, no hypoglycemic episode. He has not had colonoscopy. Patient is an ex-smoker, quit in 1992. Patient has No headache, No chest pain, No abdominal pain - No Nausea, No new weakness tingling or numbness, No Cough - SOB.  No problems updated.  ALLERGIES: Allergies  Allergen Reactions  . Shellfish Allergy Anaphylaxis  . Codeine Itching    unknown    PAST MEDICAL HISTORY: Past Medical History  Diagnosis Date  . Hypertension   . Diabetes mellitus without complication     MEDICATIONS AT HOME: Prior to Admission medications   Medication Sig Start Date End Date Taking? Authorizing Provider  amLODipine (NORVASC) 10 MG tablet Take 1 tablet (10 mg total) by mouth daily. 10/16/14  Yes Ambrose Finland, NP  cloNIDine (CATAPRES) 0.3 MG tablet Take 1 tablet (0.3 mg total) by mouth 2 (two) times daily. 10/16/14  Yes Ambrose Finland, NP  glipiZIDE (GLUCOTROL) 5 MG tablet Take 1 tablet (5 mg total) by mouth daily with breakfast. 10/16/14  Yes Ambrose Finland, NP  lisinopril (PRINIVIL,ZESTRIL) 40 MG tablet Take 1 tablet (40 mg total) by mouth daily. 12/07/14  Yes Quentin Angst, MD  metoprolol (LOPRESSOR) 50 MG tablet TAKE 1 TABLET BY MOUTH TWICE A DAY 11/13/14  Yes Quentin Angst, MD  Multiple Vitamin (MULTIVITAMIN WITH MINERALS) TABS tablet Take 1 tablet by mouth daily.   Yes Historical Provider, MD  OVER THE  COUNTER MEDICATION Take 3 tablets by mouth every other day. "Nugenix" testosterone supplement   Yes Historical Provider, MD  aspirin 325 MG tablet Take 1 tablet (325 mg total) by mouth daily. Patient not taking: Reported on 11/06/2014 04/01/13   Quentin Angst, MD  clindamycin (CLEOCIN) 300 MG capsule Take 1 capsule (300 mg total) by mouth 3 (three) times daily. Patient not taking: Reported on 11/06/2014 09/30/13   Linna Hoff, MD  glucose blood (FREESTYLE LITE) test strip Use as instructed 12/07/14   Quentin Angst, MD  Lancets (FREESTYLE) lancets Use as instructed 12/07/14   Quentin Angst, MD  pantoprazole (PROTONIX) 40 MG tablet Take 1 tablet (40 mg total) by mouth 2 (two) times daily. Patient not taking: Reported on 11/06/2014 10/16/14   Ambrose Finland, NP     Objective:   Filed Vitals:   12/07/14 1004  BP: 157/97  Pulse: 64  Temp: 98.4 F (36.9 C)  TempSrc: Oral  Resp: 16  Height:  (1.93 m)  Weight: 330 lb (149.687 kg)  SpO2: 97%    Exam General appearance : Awake, alert, not in any distress. Speech Clear. Not toxic looking HEENT: Atraumatic and Normocephalic, pupils equally reactive to light and accomodation Neck: supple, no JVD. No cervical lymphadenopathy.  Chest:Good air entry bilaterally, no added sounds  CVS: S1 S2 regular, no murmurs.  Abdomen: Bowel sounds present, Non tender and not distended with no  gaurding, rigidity or rebound. Extremities: B/L Lower Ext shows no edema, both legs are warm to touch Neurology: Awake alert, and oriented X 3, CN II-XII intact, Non focal Skin:No Rash  Data Review Lab Results  Component Value Date   HGBA1C 8.0 10/16/2014   HGBA1C 7.6* 10/20/2013   HGBA1C 7.7* 03/21/2013     Assessment & Plan   1. Type 2 diabetes mellitus without complication  - Glucose (CBG) - Lancets (FREESTYLE) lancets; Use as instructed  Dispense: 100 each; Refill: 12 - glucose blood (FREESTYLE LITE) test strip; Use as instructed   Dispense: 100 each; Refill: 12  Aim for 30 minutes of exercise most days. Rethink what you drink. Water is great! Aim for 2-3 Carb Choices per meal (30-45 grams) +/- 1 either way  Aim for 0-15 Carbs per snack if hungry  Include protein in moderation with your meals and snacks  Consider reading food labels for Total Carbohydrate and Fat Grams of foods  Consider checking BG at alternate times per day  Continue taking medication as directed Be mindful about how much sugar you are adding to beverages and other foods. Fruit Punch - find one with no sugar  Measure and decrease portions of carbohydrate foods  Make your plate and don't go back for seconds  2. Essential hypertension, benign Increase Lisinopril - lisinopril (PRINIVIL,ZESTRIL) 40 MG tablet; Take 1 tablet (40 mg total) by mouth daily.  Dispense: 30 tablet; Refill: 3 We have discussed target BP range and blood pressure goal. I have advised patient to check BP regularly and to call us back or report to clinic if the numbers are consistently higher than 140/90. We discussed the importance of compliance with medical therapy and DASH diet recommended, consequences of uncontrolled hypertension discussed.  - continue current BP medications  3. Colon cancer screening  - [facility] COLONOSCOPY - Ambulatory referral to Gastroenterology  Patient have been counseled extensively about nutrition and exercise Return in about 2 months (around 02/06/2015) for Follow up HTN, Routine Follow Up, Hemoglobin A1C and Follow up, DM.  The patient was given clear instructions to go to ER or return to medical center if symptoms don't improve, worsen or new problems develop. The patient verbalized understanding. The patient was told to call to get lab results if they haven't heard anything in the next week.   This note has been created with Education officer, environmentalDragon speech recognition software and smart phrase technology. Any transcriptional errors are unintentional.    Jeanann LewandowskyJEGEDE,  Salih Williamson, MD, MHA, CPE, FACP, FAAP [facility] [place], [place] [phone]   12/07/2014, 11:07 AM

## 2014-12-07 NOTE — Progress Notes (Signed)
Pt is here following up on his diabetes and HTN. Pt states that his BP has been elevated.

## 2015-08-09 ENCOUNTER — Other Ambulatory Visit: Payer: Self-pay | Admitting: Internal Medicine

## 2015-08-10 NOTE — Telephone Encounter (Signed)
Pt. Called requesting a med refill on Lisinopril. Please f/u with pt.

## 2015-08-12 ENCOUNTER — Telehealth: Payer: Self-pay

## 2015-08-12 DIAGNOSIS — I10 Essential (primary) hypertension: Secondary | ICD-10-CM

## 2015-08-12 MED ORDER — LISINOPRIL 40 MG PO TABS: 40.0000 mg | ORAL_TABLET | Freq: Every day | ORAL | Status: DC

## 2015-08-12 NOTE — Telephone Encounter (Signed)
Nurse called patient at both numbers on chart, reached voicemail. Left message, on both numbers, [facility] [phone]. Patient called requesting refill for lisinopril. Patients last OV 12/07/14. Nurse will refill lisinopril for 1 month.  Patient needs appointment for additional refills.

## 2015-09-09 ENCOUNTER — Ambulatory Visit: Payer: Medicare Other | Attending: Internal Medicine | Admitting: Internal Medicine

## 2015-09-09 ENCOUNTER — Encounter: Payer: Self-pay | Admitting: Internal Medicine

## 2015-09-09 VITALS — BP 149/100 | HR 79 | Temp 98.2°F | Resp 16 | Ht 76.0 in | Wt 331.0 lb

## 2015-09-09 DIAGNOSIS — Z7982 Long term (current) use of aspirin: Secondary | ICD-10-CM | POA: Insufficient documentation

## 2015-09-09 DIAGNOSIS — Z888 Allergy status to other drugs, medicaments and biological substances status: Secondary | ICD-10-CM | POA: Diagnosis not present

## 2015-09-09 DIAGNOSIS — L0291 Cutaneous abscess, unspecified: Secondary | ICD-10-CM | POA: Insufficient documentation

## 2015-09-09 DIAGNOSIS — I10 Essential (primary) hypertension: Secondary | ICD-10-CM | POA: Diagnosis not present

## 2015-09-09 DIAGNOSIS — Z79899 Other long term (current) drug therapy: Secondary | ICD-10-CM | POA: Diagnosis not present

## 2015-09-09 DIAGNOSIS — K029 Dental caries, unspecified: Secondary | ICD-10-CM | POA: Diagnosis not present

## 2015-09-09 DIAGNOSIS — E119 Type 2 diabetes mellitus without complications: Secondary | ICD-10-CM | POA: Insufficient documentation

## 2015-09-09 LAB — GLUCOSE, POCT (MANUAL RESULT ENTRY): POC Glucose: 111 mg/dl — AB (ref 70–99)

## 2015-09-09 LAB — POCT GLYCOSYLATED HEMOGLOBIN (HGB A1C): HEMOGLOBIN A1C: 7.9

## 2015-09-09 NOTE — Progress Notes (Signed)
Patient's here for Dental referral.   Patient states he's currently on abx (amoxcillian) for an abscess.  Patient denies pain today. Patient as taken all meds today.

## 2015-09-09 NOTE — Patient Instructions (Addendum)
Diabetes and Exercise Exercising regularly is important. It is not just about losing weight. It has many health benefits, such as:  Improving your overall fitness, flexibility, and endurance.  Increasing your bone density.  Helping with weight control.  Decreasing your body fat.  Increasing your muscle strength.  Reducing stress and tension.  Improving your overall health. People with diabetes who exercise gain additional benefits because exercise:  Reduces appetite.  Improves the body's use of blood sugar (glucose).  Helps lower or control blood glucose.  Decreases blood pressure.  Helps control blood lipids (such as cholesterol and triglycerides).  Improves the body's use of the hormone insulin by:  Increasing the body's insulin sensitivity.  Reducing the body's insulin needs.  Decreases the risk for heart disease because exercising:  Lowers cholesterol and triglycerides levels.  Increases the levels of good cholesterol (such as high-density lipoproteins [HDL]) in the body.  Lowers blood glucose levels. YOUR ACTIVITY PLAN  Choose an activity that you enjoy, and set realistic goals. To exercise safely, you should begin practicing any new physical activity slowly, and gradually increase the intensity of the exercise over time. Your health care provider or diabetes educator can help create an activity plan that works for you. General recommendations include:  Encouraging children to engage in at least 60 minutes of physical activity each day.  Stretching and performing strength training exercises, such as yoga or weight lifting, at least 2 times per week.  Performing a total of at least 150 minutes of moderate-intensity exercise each week, such as brisk walking or water aerobics.  Exercising at least 3 days per week, making sure you allow no more than 2 consecutive days to pass without exercising.  Avoiding long periods of inactivity (90 minutes or more). When you  have to spend an extended period of time sitting down, take frequent breaks to walk or stretch. RECOMMENDATIONS FOR EXERCISING WITH TYPE 1 OR TYPE 2 DIABETES   Check your blood glucose before exercising. If blood glucose levels are greater than 240 mg/dL, check for urine ketones. Do not exercise if ketones are present.  Avoid injecting insulin into areas of the body that are going to be exercised. For example, avoid injecting insulin into:  The arms when playing tennis.  The legs when jogging.  Keep a record of:  Food intake before and after you exercise.  Expected peak times of insulin action.  Blood glucose levels before and after you exercise.  The type and amount of exercise you have done.  Review your records with your health care provider. Your health care provider will help you to develop guidelines for adjusting food intake and insulin amounts before and after exercising.  If you take insulin or oral hypoglycemic agents, watch for signs and symptoms of hypoglycemia. They include:  Dizziness.  Shaking.  Sweating.  Chills.  Confusion.  Drink plenty of water while you exercise to prevent dehydration or heat stroke. Body water is lost during exercise and must be replaced.  Talk to your health care provider before starting an exercise program to make sure it is safe for you. Remember, almost any type of activity is better than none.   This information is not intended to replace advice given to you by your health care provider. Make sure you discuss any questions you have with your health care provider.   Document Released: 10/21/2003 Document Revised: 12/15/2014 Document Reviewed: 01/07/2013 Elsevier Interactive Patient Education 2016 Elsevier Inc. Basic Carbohydrate Counting for Diabetes Mellitus Carbohydrate counting   is a method for keeping track of the amount of carbohydrates you eat. Eating carbohydrates naturally increases the level of sugar (glucose) in your  blood, so it is important for you to know the amount that is okay for you to have in every meal. Carbohydrate counting helps keep the level of glucose in your blood within normal limits. The amount of carbohydrates allowed is different for every person. A dietitian can help you calculate the amount that is right for you. Once you know the amount of carbohydrates you can have, you can count the carbohydrates in the foods you want to eat. Carbohydrates are found in the following foods:  Grains, such as breads and cereals.  Dried beans and soy products.  Starchy vegetables, such as potatoes, peas, and corn.  Fruit and fruit juices.  Milk and yogurt.  Sweets and snack foods, such as cake, cookies, candy, chips, soft drinks, and fruit drinks. CARBOHYDRATE COUNTING There are two ways to count the carbohydrates in your food. You can use either of the methods or a combination of both. Reading the "Nutrition Facts" on Packaged Food The "Nutrition Facts" is an area that is included on the labels of almost all packaged food and beverages in the [place]. It includes the serving size of that food or beverage and information about the nutrients in each serving of the food, including the grams (g) of carbohydrate per serving.  Decide the number of servings of this food or beverage that you will be able to eat or drink. Multiply that number of servings by the number of grams of carbohydrate that is listed on the label for that serving. The total will be the amount of carbohydrates you will be having when you eat or drink this food or beverage. Learning Standard Serving Sizes of Food When you eat food that is not packaged or does not include "Nutrition Facts" on the label, you need to measure the servings in order to count the amount of carbohydrates.A serving of most carbohydrate-rich foods contains about 15 g of carbohydrates. The following list includes serving sizes of carbohydrate-rich foods that  provide 15 g ofcarbohydrate per serving:   1 slice of bread (1 oz) or 1 six-inch tortilla.    of a hamburger bun or English muffin.  4-6 crackers.   cup unsweetened dry cereal.    cup hot cereal.   cup rice or pasta.    cup mashed potatoes or  of a large baked potato.  1 cup fresh fruit or one small piece of fruit.    cup canned or frozen fruit or fruit juice.  1 cup milk.   cup plain fat-free yogurt or yogurt sweetened with artificial sweeteners.   cup cooked dried beans or starchy vegetable, such as peas, corn, or potatoes.  Decide the number of standard-size servings that you will eat. Multiply that number of servings by 15 (the grams of carbohydrates in that serving). For example, if you eat 2 cups of strawberries, you will have eaten 2 servings and 30 g of carbohydrates (2 servings x 15 g = 30 g). For foods such as soups and casseroles, in which more than one food is mixed in, you will need to count the carbohydrates in each food that is included. EXAMPLE OF CARBOHYDRATE COUNTING Sample Dinner  3 oz chicken breast.   cup of brown rice.   cup of corn.  1 cup milk.   1 cup strawberries with sugar-free whipped topping.  Carbohydrate Calculation Step   1: Identify the foods that contain carbohydrates:   Rice.   Corn.   Milk.   Strawberries. Step 2:Calculate the number of servings eaten of each:   2 servings of rice.   1 serving of corn.   1 serving of milk.   1 serving of strawberries. Step 3: Multiply each of those number of servings by 15 g:   2 servings of rice x 15 g = 30 g.   1 serving of corn x 15 g = 15 g.   1 serving of milk x 15 g = 15 g.   1 serving of strawberries x 15 g = 15 g. Step 4: Add together all of the amounts to find the total grams of carbohydrates eaten: 30 g + 15 g + 15 g + 15 g = 75 g.   This information is not intended to replace advice given to you by your health care provider. Make sure you  discuss any questions you have with your health care provider.   Document Released: 07/31/2005 Document Revised: 08/21/2014 Document Reviewed: 06/27/2013 Elsevier Interactive Patient Education 2016 Elsevier Inc[employer] DASH Eating Plan DASH stands for "Dietary Approaches to Stop Hypertension." The DASH eating plan is a healthy eating plan that has been shown to reduce high blood pressure (hypertension). Additional health benefits may include reducing the risk of type 2 diabetes mellitus, heart disease, and stroke. The DASH eating plan may also help with weight loss. WHAT DO I NEED TO KNOW ABOUT THE DASH EATING PLAN? For the DASH eating plan, you will follow these general guidelines:  Choose foods with a percent daily value for sodium of less than 5% (as listed on the food label).  Use salt-free seasonings or herbs instead of table salt or sea salt.  Check with your health care provider or pharmacist before using salt substitutes.  Eat lower-sodium products, often labeled as "lower sodium" or "no salt added."  Eat fresh foods.  Eat more vegetables, fruits, and low-fat dairy products.  Choose whole grains. Look for the word "whole" as the first word in the ingredient list.  Choose fish and skinless chicken or turkey more often than red meat. Limit fish, poultry, and meat to 6 oz (170 g) each day.  Limit sweets, desserts, sugars, and sugary drinks.  Choose heart-healthy fats.  Limit cheese to 1 oz (28 g) per day.  Eat more home-cooked food and less restaurant, buffet, and fast food.  Limit fried foods.  Cook foods using methods other than frying.  Limit canned vegetables. If you do use them, rinse them well to decrease the sodium.  When eating at a restaurant, ask that your food be prepared with less salt, or no salt if possible. WHAT FOODS CAN I EAT? Seek help from a dietitian for individual calorie needs. Grains Whole grain or whole wheat bread. Brown rice. Whole grain or whole  wheat pasta. Quinoa, bulgur, and whole grain cereals. Low-sodium cereals. Corn or whole wheat flour tortillas. Whole grain cornbread. Whole grain crackers. Low-sodium crackers. Vegetables Fresh or frozen vegetables (raw, steamed, roasted, or grilled). Low-sodium or reduced-sodium tomato and vegetable juices. Low-sodium or reduced-sodium tomato sauce and paste. Low-sodium or reduced-sodium canned vegetables.  Fruits All fresh, canned (in natural juice), or frozen fruits. Meat and Other Protein Products Ground beef (85% or leaner), grass-fed beef, or beef trimmed of fat. Skinless chicken or turkey. Ground chicken or turkey. Pork trimmed of fat. All fish and seafood. Eggs. Dried beans, peas, or lentils. Unsalted nuts   and seeds. Unsalted canned beans. Dairy Low-fat dairy products, such as skim or 1% milk, 2% or reduced-fat cheeses, low-fat ricotta or cottage cheese, or plain low-fat yogurt. Low-sodium or reduced-sodium cheeses. Fats and Oils Tub margarines without trans fats. Light or reduced-fat mayonnaise and salad dressings (reduced sodium). Avocado. Safflower, olive, or canola oils. Natural peanut or almond butter. Other Unsalted popcorn and pretzels. The items listed above may not be a complete list of recommended foods or beverages. Contact your dietitian for more options. WHAT FOODS ARE NOT RECOMMENDED? Grains White bread. White pasta. White rice. Refined cornbread. Bagels and croissants. Crackers that contain trans fat. Vegetables Creamed or fried vegetables. Vegetables in a cheese sauce. Regular canned vegetables. Regular canned tomato sauce and paste. Regular tomato and vegetable juices. Fruits Dried fruits. Canned fruit in light or heavy syrup. Fruit juice. Meat and Other Protein Products Fatty cuts of meat. Ribs, chicken wings, bacon, sausage, bologna, salami, chitterlings, fatback, hot dogs, bratwurst, and packaged luncheon meats. Salted nuts and seeds. Canned beans with  salt. Dairy Whole or 2% milk, cream, half-and-half, and cream cheese. Whole-fat or sweetened yogurt. Full-fat cheeses or blue cheese. Nondairy creamers and whipped toppings. Processed cheese, cheese spreads, or cheese curds. Condiments Onion and garlic salt, seasoned salt, table salt, and sea salt. Canned and packaged gravies. Worcestershire sauce. Tartar sauce. Barbecue sauce. Teriyaki sauce. Soy sauce, including reduced sodium. Steak sauce. Fish sauce. Oyster sauce. Cocktail sauce. Horseradish. Ketchup and mustard. Meat flavorings and tenderizers. Bouillon cubes. Hot sauce. Tabasco sauce. Marinades. Taco seasonings. Relishes. Fats and Oils Butter, stick margarine, lard, shortening, ghee, and bacon fat. Coconut, palm kernel, or palm oils. Regular salad dressings. Other Pickles and olives. Salted popcorn and pretzels. The items listed above may not be a complete list of foods and beverages to avoid. Contact your dietitian for more information. WHERE CAN I FIND MORE INFORMATION? National Heart, Lung, and [employer] Institute: www.nhlbi.nih.gov/health/health-topics/topics/dash/   This information is not intended to replace advice given to you by your health care provider. Make sure you discuss any questions you have with your health care provider.   Document Released: 07/20/2011 Document Revised: 08/21/2014 Document Reviewed: 06/04/2013 Elsevier Interactive Patient Education 2016 Elsevier Inc. Hypertension Hypertension, commonly called high blood pressure, is when the force of blood pumping through your arteries is too strong. Your arteries are the blood vessels that carry blood from your heart throughout your body. A blood pressure reading consists of a higher number over a lower number, such as 110/72. The higher number (systolic) is the pressure inside your arteries when your heart pumps. The lower number (diastolic) is the pressure inside your arteries when your heart relaxes. Ideally you want your  blood pressure below 120/80. Hypertension forces your heart to work harder to pump blood. Your arteries may become narrow or stiff. Having untreated or uncontrolled hypertension can cause heart attack, stroke, kidney disease, and other problems. RISK FACTORS Some risk factors for high blood pressure are controllable. Others are not.  Risk factors you cannot control include:   Race. You may be at higher risk if you are African American.  Age. Risk increases with age.  Gender. Men are at higher risk than women before age 45 years. After age 65, women are at higher risk than men. Risk factors you can control include:  Not getting enough exercise or physical activity.  Being overweight.  Getting too much fat, sugar, calories, or salt in your diet.  Drinking too much alcohol. SIGNS AND SYMPTOMS Hypertension does   not usually cause signs or symptoms. Extremely high blood pressure (hypertensive crisis) may cause headache, anxiety, shortness of breath, and nosebleed. DIAGNOSIS To check if you have hypertension, your health care provider will measure your blood pressure while you are seated, with your arm held at the level of your heart. It should be measured at least twice using the same arm. Certain conditions can cause a difference in blood pressure between your right and left arms. A blood pressure reading that is higher than normal on one occasion does not mean that you need treatment. If it is not clear whether you have high blood pressure, you may be asked to return on a different day to have your blood pressure checked again. Or, you may be asked to monitor your blood pressure at home for 1 or more weeks. TREATMENT Treating high blood pressure includes making lifestyle changes and possibly taking medicine. Living a healthy lifestyle can help lower high blood pressure. You may need to change some of your habits. Lifestyle changes may include:  Following the DASH diet. This diet is high in  fruits, vegetables, and whole grains. It is low in salt, red meat, and added sugars.  Keep your sodium intake below 2,300 mg per day.  Getting at least 30-45 minutes of aerobic exercise at least 4 times per week.  Losing weight if necessary.  Not smoking.  Limiting alcoholic beverages.  Learning ways to reduce stress. Your health care provider may prescribe medicine if lifestyle changes are not enough to get your blood pressure under control, and if one of the following is true:  You are 18-59 years of age and your systolic blood pressure is above 140.  You are 60 years of age or older, and your systolic blood pressure is above 150.  Your diastolic blood pressure is above 90.  You have diabetes, and your systolic blood pressure is over 140 or your diastolic blood pressure is over 90.  You have kidney disease and your blood pressure is above 140/90.  You have heart disease and your blood pressure is above 140/90. Your personal target blood pressure may vary depending on your medical conditions, your age, and other factors. HOME CARE INSTRUCTIONS  Have your blood pressure rechecked as directed by your health care provider.   Take medicines only as directed by your health care provider. Follow the directions carefully. Blood pressure medicines must be taken as prescribed. The medicine does not work as well when you skip doses. Skipping doses also puts you at risk for problems.  Do not smoke.   Monitor your blood pressure at home as directed by your health care provider. SEEK MEDICAL CARE IF:   You think you are having a reaction to medicines taken.  You have recurrent headaches or feel dizzy.  You have swelling in your ankles.  You have trouble with your vision. SEEK IMMEDIATE MEDICAL CARE IF:  You develop a severe headache or confusion.  You have unusual weakness, numbness, or feel faint.  You have severe chest or abdominal pain.  You vomit repeatedly.  You  have trouble breathing. MAKE SURE YOU:   Understand these instructions.  Will watch your condition.  Will get help right away if you are not doing well or get worse.   This information is not intended to replace advice given to you by your health care provider. Make sure you discuss any questions you have with your health care provider.   Document Released: 07/31/2005 Document Revised: 12/15/2014 Document   Reviewed: 05/23/2013 Elsevier Interactive Patient Education 2016 Elsevier Inc.  

## 2015-09-09 NOTE — Progress Notes (Signed)
Patient ID: Anthony Barnes, male   DOB: Nov 28, 1955, 60 y.o.   MRN: [id number]   Anthony Barnes, is a 60 y.o. male  [id number]  [id number]  DOB - 12-05-1955  Chief Complaint  Patient presents with  . Referral        Subjective:   Anthony Barnes is a 60 y.o. male with history of hypertension, obesity and type 2 diabetes mellitus here today for a follow up visit and for dental referral. Patient has had toothache and abscess for about a week, they have seen a dentist who prescribed antibiotics and pain medication for patient but will only do the definitive treatment of tooth extraction if patient can be $500. Patient is here today for referral to another dentist who will take his insurance. His blood sugar has not been well controlled but patient claims compliant with medications. He would like to schedule another appointment for for full workup. He does not need to refill his medications today but will soon need them probably during his next visit. Patient has No headache, No chest pain, No abdominal pain - No Nausea, No new weakness tingling or numbness, No Cough - SOB.  Problem  Dental Caries    ALLERGIES: Allergies  Allergen Reactions  . Shellfish Allergy Anaphylaxis  . Codeine Itching    unknown    PAST MEDICAL HISTORY: Past Medical History  Diagnosis Date  . Hypertension   . Diabetes mellitus without complication (HCC)     MEDICATIONS AT HOME: Prior to Admission medications   Medication Sig Start Date End Date Taking? Authorizing Provider  amLODipine (NORVASC) 10 MG tablet Take 1 tablet (10 mg total) by mouth daily. 10/16/14  Yes Ambrose Finland, NP  amoxicillin (AMOXIL) 500 MG capsule Take 500 mg by mouth 3 (three) times daily.   Yes Historical Provider, MD  aspirin 325 MG tablet Take 1 tablet (325 mg total) by mouth daily. 04/01/13  Yes Quentin Angst, MD  cloNIDine (CATAPRES) 0.3 MG tablet Take 1 tablet (0.3 mg total) by mouth 2 (two) times daily. 10/16/14   Yes Ambrose Finland, NP  glipiZIDE (GLUCOTROL) 5 MG tablet Take 1 tablet (5 mg total) by mouth daily with breakfast. 10/16/14  Yes Ambrose Finland, NP  glucose blood (FREESTYLE LITE) test strip Use as instructed 12/07/14  Yes Quentin Angst, MD  Lancets (FREESTYLE) lancets Use as instructed 12/07/14  Yes Quentin Angst, MD  lisinopril (PRINIVIL,ZESTRIL) 40 MG tablet Take 1 tablet (40 mg total) by mouth daily. Must have office visit for more refills 08/12/15  Yes Quentin Angst, MD  metoprolol (LOPRESSOR) 50 MG tablet TAKE 1 TABLET BY MOUTH TWICE A DAY 11/13/14  Yes Quentin Angst, MD  Multiple Vitamin (MULTIVITAMIN WITH MINERALS) TABS tablet Take 1 tablet by mouth daily.   Yes Historical Provider, MD  OVER THE COUNTER MEDICATION Take 3 tablets by mouth every other day. Reported on 09/09/2015   Yes Historical Provider, MD  clindamycin (CLEOCIN) 300 MG capsule Take 1 capsule (300 mg total) by mouth 3 (three) times daily. Patient not taking: Reported on 11/06/2014 09/30/13   Linna Hoff, MD  pantoprazole (PROTONIX) 40 MG tablet Take 1 tablet (40 mg total) by mouth 2 (two) times daily. Patient not taking: Reported on 11/06/2014 10/16/14   Ambrose Finland, NP     Objective:   Filed Vitals:   09/09/15 1715  BP: 149/100  Pulse: 79  Temp: 98.2 F (36.8 C)  TempSrc: Oral  Resp:  16  Height:  (1.93 m)  Weight: 331 lb (150.141 kg)  SpO2: 97%    Exam General appearance : Awake, alert, not in any distress. Speech Clear. Not toxic looking HEENT: Atraumatic and Normocephalic, pupils equally reactive to light and accomodation Neck: supple, no JVD. No cervical lymphadenopathy.  Chest:Good air entry bilaterally, no added sounds  CVS: S1 S2 regular, no murmurs.  Abdomen: Bowel sounds present, Non tender and not distended with no gaurding, rigidity or rebound. Extremities: B/L Lower Ext shows no edema, both legs are warm to touch Neurology: Awake alert, and oriented X 3, CN II-XII  intact, Non focal Skin:No Rash  Data Review Lab Results  Component Value Date   HGBA1C 7.90 09/09/2015   HGBA1C 8.0 10/16/2014   HGBA1C 7.6* 10/20/2013     Assessment & Plan   1. Type 2 diabetes mellitus without complication, without long-term current use of insulin (HCC)  - POCT A1C - Glucose (CBG)  Aim for 30 minutes of exercise most days. Rethink what you drink. Water is great! Aim for 2-3 Carb Choices per meal (30-45 grams) +/- 1 either way  Aim for 0-15 Carbs per snack if hungry  Include protein in moderation with your meals and snacks  Consider reading food labels for Total Carbohydrate and Fat Grams of foods  Consider checking BG at alternate times per day  Continue taking medication as directed Be mindful about how much sugar you are adding to beverages and other foods. Fruit Punch - find one with no sugar  Measure and decrease portions of carbohydrate foods  Make your plate and don't go back for seconds   2. Dental caries  Ambulatory Referral to Dentistry for tooth extraction  3. Essential hypertension, benign  We have discussed target BP range and blood pressure goal. I have advised patient to check BP regularly and to call us back or report to clinic if the numbers are consistently higher than 140/90. We discussed the importance of compliance with medical therapy and DASH diet recommended, consequences of uncontrolled hypertension discussed.   - continue current BP medications  Patient have been counseled extensively about nutrition and exercise  Return in about 4 weeks (around 10/07/2015) for Follow up Pain and comorbidities, Follow up HTN.  The patient was given clear instructions to go to ER or return to medical center if symptoms don't improve, worsen or new problems develop. The patient verbalized understanding. The patient was told to call to get lab results if they haven't heard anything in the next week.   This note has been created with Biomedical engineer. Any transcriptional errors are unintentional.    Jeanann Lewandowsky, MD, MHA, Maxwell Caul, CPE [facility] [place], [place] [phone]   09/09/2015, 5:52 PM

## 2015-10-20 ENCOUNTER — Other Ambulatory Visit: Payer: Self-pay | Admitting: Internal Medicine

## 2015-10-21 NOTE — Telephone Encounter (Signed)
Patient is calling requesting medication refill on all his medications.Anthony Barnes.Anthony Barnes.Anthony Barnes.Anthony Barnes.please follow up with patient as soon as possible he is about to run out. Patient states he was informed by pcp he would receive refills...please follow up with patient

## 2015-10-26 ENCOUNTER — Other Ambulatory Visit: Payer: Self-pay | Admitting: *Deleted

## 2015-10-26 NOTE — Telephone Encounter (Signed)
Patients medications have been refilled by clinic Pharmacist on 10/21/15

## 2015-10-26 NOTE — Telephone Encounter (Signed)
Patients medications were refilled on 10/21/15

## 2016-01-18 ENCOUNTER — Other Ambulatory Visit: Payer: Self-pay | Admitting: Internal Medicine

## 2016-01-18 NOTE — Telephone Encounter (Signed)
Medication Refill: all medications  Pt states he is out of all his medications in 7 days.  Pt tried to schedule appt but provider's schedule is all booked.  I placed pt on the wait list

## 2016-01-24 MED ORDER — GLIPIZIDE 5 MG PO TABS: 5.0000 mg | ORAL_TABLET | Freq: Every day | ORAL | Status: DC

## 2016-01-24 MED ORDER — AMLODIPINE BESYLATE 10 MG PO TABS: 10.0000 mg | ORAL_TABLET | Freq: Every day | ORAL | Status: DC

## 2016-01-24 MED ORDER — LISINOPRIL 40 MG PO TABS: 40.0000 mg | ORAL_TABLET | Freq: Every day | ORAL | Status: DC

## 2016-01-24 MED ORDER — METOPROLOL TARTRATE 50 MG PO TABS: 50.0000 mg | ORAL_TABLET | Freq: Two times a day (BID) | ORAL | Status: DC

## 2016-01-24 MED ORDER — CLONIDINE HCL 0.3 MG PO TABS: 0.3000 mg | ORAL_TABLET | Freq: Two times a day (BID) | ORAL | Status: DC

## 2016-01-24 NOTE — Telephone Encounter (Signed)
Patient called requesting medication refill on all current medications, please f/up

## 2016-01-24 NOTE — Telephone Encounter (Signed)
Medications refilled x 30 days which should last him until he is able to be seen by provider.

## 2016-02-22 ENCOUNTER — Telehealth: Payer: Self-pay | Admitting: Internal Medicine

## 2016-02-22 MED ORDER — METOPROLOL TARTRATE 50 MG PO TABS: 50.0000 mg | ORAL_TABLET | Freq: Two times a day (BID) | ORAL | Status: DC

## 2016-02-22 MED ORDER — LISINOPRIL 40 MG PO TABS: 40.0000 mg | ORAL_TABLET | Freq: Every day | ORAL | Status: DC

## 2016-02-22 MED ORDER — CLONIDINE HCL 0.3 MG PO TABS: 0.3000 mg | ORAL_TABLET | Freq: Two times a day (BID) | ORAL | Status: DC

## 2016-02-22 MED ORDER — AMLODIPINE BESYLATE 10 MG PO TABS: 10.0000 mg | ORAL_TABLET | Freq: Every day | ORAL | Status: DC

## 2016-02-22 MED ORDER — GLIPIZIDE 5 MG PO TABS: 5.0000 mg | ORAL_TABLET | Freq: Every day | ORAL | Status: DC

## 2016-02-22 NOTE — Telephone Encounter (Signed)
Requested medication refills were sent. Patient must have office visit for further refills.

## 2016-02-22 NOTE — Telephone Encounter (Signed)
Pt. Called requesting a refill on the following medications:  cloNIDine (CATAPRES) 0.3 MG tablet glipiZIDE (GLUCOTROL) 5 MG tablet  amLODipine (NORVASC) 10 MG tablet  metoprolol (LOPRESSOR) 50 MG tablet lisinopril (PRINIVIL,ZESTRIL) 40 MG tablet   Please f/u with pt.

## 2016-03-13 ENCOUNTER — Encounter: Payer: Self-pay | Admitting: Family Medicine

## 2016-03-13 ENCOUNTER — Telehealth: Payer: Self-pay | Admitting: Family Medicine

## 2016-03-13 ENCOUNTER — Ambulatory Visit: Payer: Medicare Other | Attending: Family Medicine | Admitting: Family Medicine

## 2016-03-13 VITALS — BP 158/99 | HR 81 | Temp 98.4°F | Ht 76.0 in | Wt 327.6 lb

## 2016-03-13 DIAGNOSIS — E119 Type 2 diabetes mellitus without complications: Secondary | ICD-10-CM

## 2016-03-13 DIAGNOSIS — Z794 Long term (current) use of insulin: Secondary | ICD-10-CM | POA: Diagnosis not present

## 2016-03-13 DIAGNOSIS — I10 Essential (primary) hypertension: Secondary | ICD-10-CM | POA: Diagnosis not present

## 2016-03-13 LAB — POCT GLYCOSYLATED HEMOGLOBIN (HGB A1C): Hemoglobin A1C: 7.5

## 2016-03-13 LAB — GLUCOSE, POCT (MANUAL RESULT ENTRY): POC Glucose: 140 mg/dl — AB (ref 70–99)

## 2016-03-13 MED ORDER — AMLODIPINE BESYLATE 10 MG PO TABS: 10.0000 mg | ORAL_TABLET | Freq: Every day | ORAL | 3 refills | Status: DC

## 2016-03-13 MED ORDER — METOPROLOL TARTRATE 100 MG PO TABS: 100.0000 mg | ORAL_TABLET | Freq: Two times a day (BID) | ORAL | 3 refills | Status: DC

## 2016-03-13 MED ORDER — GLIPIZIDE 5 MG PO TABS: 5.0000 mg | ORAL_TABLET | Freq: Two times a day (BID) | ORAL | 0 refills | Status: DC

## 2016-03-13 MED ORDER — AMLODIPINE BESYLATE 10 MG PO TABS: 10.0000 mg | ORAL_TABLET | Freq: Every day | ORAL | 0 refills | Status: DC

## 2016-03-13 MED ORDER — GLUCOSE BLOOD VI STRP: ORAL_STRIP | 5 refills | Status: DC

## 2016-03-13 MED ORDER — GLIPIZIDE 5 MG PO TABS: 5.0000 mg | ORAL_TABLET | Freq: Two times a day (BID) | ORAL | 3 refills | Status: DC

## 2016-03-13 MED ORDER — CLONIDINE HCL 0.3 MG PO TABS: 0.3000 mg | ORAL_TABLET | Freq: Two times a day (BID) | ORAL | 3 refills | Status: DC

## 2016-03-13 MED ORDER — ACCU-CHEK SOFTCLIX LANCET DEV MISC: 5 refills | Status: DC

## 2016-03-13 MED FILL — ?AMLODIPINE BESYLATE 10 MG: 10 | 15 days supply | Qty: 15 | Fill #0

## 2016-03-13 MED FILL — ?GLIPIZIDE 5 MG TABLET: 5 | 30 days supply | Qty: 60 | Fill #0

## 2016-03-13 NOTE — Telephone Encounter (Signed)
[facility] called requesting clarification on the test strips that were prescribed to the pt. Please f/u

## 2016-03-13 NOTE — Progress Notes (Signed)
Subjective:  Patient ID: Anthony Barnes, male    DOB: [birth year]  Age: 60 y.o. MRN: [id number]  CC: Hypertension and Diabetes   HPI Anthony Barnes is a 60 year old male with a history of hypertension, type 2 diabetes mellitus A1c 7.5 from today, morbid obesity who comes into the clinic for follow-up visit.  He has been compliant with his medications but has not been exercising. Has not been checking his blood sugar log but denies hypoglycemic episodes. Denies numbness in extremities.  He is wondering about "testosterone booster" and when I discussed ordering testosterone labs, available options of replacement he declines stating he does not want to get shots for testosterone however he would bring in the pill at his next office visit which he was taking for replacement.  Past Medical History:  Diagnosis Date  . Diabetes mellitus without complication (HCC)   . Hypertension     Past Surgical History:  Procedure Laterality Date  . ESOPHAGOGASTRODUODENOSCOPY N/A 03/22/2013   Procedure: ESOPHAGOGASTRODUODENOSCOPY (EGD);  Surgeon: Graylin Shiver, MD;  Location: [facility];  Service: Endoscopy;  Laterality: N/A;     Outpatient Medications Prior to Visit  Medication Sig Dispense Refill  . aspirin 325 MG tablet Take 1 tablet (325 mg total) by mouth daily. 30 tablet 3  . lisinopril (PRINIVIL,ZESTRIL) 40 MG tablet Take 1 tablet (40 mg total) by mouth daily. Must have office visit for refills 30 tablet 0  . Multiple Vitamin (MULTIVITAMIN WITH MINERALS) TABS tablet Take 1 tablet by mouth daily.    Marland Kitchen amLODipine (NORVASC) 10 MG tablet Take 1 tablet (10 mg total) by mouth daily. Must have office visit for refills 30 tablet 0  . cloNIDine (CATAPRES) 0.3 MG tablet Take 1 tablet (0.3 mg total) by mouth 2 (two) times daily. Must have office visit for refills 60 tablet 0  . glipiZIDE (GLUCOTROL) 5 MG tablet Take 1 tablet (5 mg total) by mouth daily before breakfast. Must have office visit for  refills 30 tablet 0  . metoprolol (LOPRESSOR) 50 MG tablet Take 1 tablet (50 mg total) by mouth 2 (two) times daily. Must have office visit for refills 60 tablet 0  . OVER THE COUNTER MEDICATION Take 3 tablets by mouth every other day. Reported on 09/09/2015    . pantoprazole (PROTONIX) 40 MG tablet Take 1 tablet (40 mg total) by mouth 2 (two) times daily. (Patient not taking: Reported on 11/06/2014) 180 tablet 3  . glucose blood (FREESTYLE LITE) test strip Use as instructed (Patient not taking: Reported on 03/13/2016) 100 each 12  . Lancets (FREESTYLE) lancets Use as instructed (Patient not taking: Reported on 03/13/2016) 100 each 12   No facility-administered medications prior to visit.     ROS Review of Systems  Constitutional: Negative for activity change and appetite change.  HENT: Negative for sinus pressure and sore throat.   Eyes: Negative for visual disturbance.  Respiratory: Negative for cough, chest tightness and shortness of breath.   Cardiovascular: Negative for chest pain and leg swelling.  Gastrointestinal: Negative for abdominal distention, abdominal pain, constipation and diarrhea.  Endocrine: Negative.   Genitourinary: Negative for dysuria.  Musculoskeletal: Negative for joint swelling and myalgias.  Skin: Negative for rash.  Allergic/Immunologic: Negative.   Neurological: Negative for weakness, light-headedness and numbness.  Psychiatric/Behavioral: Negative for dysphoric mood and suicidal ideas.    Objective:  BP (!) 158/99 (BP Location: Right Arm, Patient Position: Sitting, Cuff Size: Large)   Pulse 81   Temp 98.4 F (  36.9 C) (Oral)   Ht 6\' 4"  (1.93 m)   Wt (!) 327 lb 9.6 oz (148.6 kg)   SpO2 99%   BMI 39.88 kg/m   BP/Weight 03/13/2016 09/09/2015 12/07/2014  Systolic BP 158 149 157  Diastolic BP 99 100 97  Wt. (Lbs) 327.6 331 330  BMI 39.88 40.31 40.19      Physical Exam  Constitutional: He is oriented to person, place, and time. He appears well-developed  and well-nourished.  Cardiovascular: Normal rate, normal heart sounds and intact distal pulses.   No murmur heard. Pulmonary/Chest: Effort normal and breath sounds normal. He has no wheezes. He has no rales. He exhibits no tenderness.  Abdominal: Soft. Bowel sounds are normal. He exhibits no distension and no mass. There is no tenderness.  Musculoskeletal: Normal range of motion.  Neurological: He is alert and oriented to person, place, and time.  Skin: Skin is warm and dry.  Psychiatric: He has a normal mood and affect.    Assessment & Plan:   1. Type 2 diabetes mellitus without complication, with long-term current use of insulin (HCC) Not fully optimized with A1c of 7.5 which has trended down from 7.9 previously Increase glipizide to 5 mg twice daily ADA diet, weight loss and lifestyle modifications - Glucose (CBG) - HgB A1c - COMPLETE METABOLIC PANEL WITH GFR; Future - Lipid panel; Future - Microalbumin / creatinine urine ratio; Future - glipiZIDE (GLUCOTROL) 5 MG tablet; Take 1 tablet (5 mg total) by mouth 2 (two) times daily before a meal.  Dispense: 30 tablet; Refill: 0 - glucose blood (ACCU-CHEK AVIVA) test strip; Use daily before breakfast  Dispense: 100 each; Refill: 5 - Lancet Devices (ACCU-CHEK SOFTCLIX) lancets; Use as instructed daily.  Dispense: 1 each; Refill: 5  2. Essential hypertension, benign Uncontrolled Increase metoprolol from 50 mg to 100 mg twice daily - cloNIDine (CATAPRES) 0.3 MG tablet; Take 1 tablet (0.3 mg total) by mouth 2 (two) times daily.  Dispense: 60 tablet; Refill: 3 - metoprolol (LOPRESSOR) 100 MG tablet; Take 1 tablet (100 mg total) by mouth 2 (two) times daily.  Dispense: 60 tablet; Refill: 3 - amLODipine (NORVASC) 10 MG tablet; Take 1 tablet (10 mg total) by mouth daily. Must have office visit for refills  Dispense: 15 tablet; Refill: 0  3. Morbid obesity due to excess calories (HCC) Emphasized caloric restriction, increasing physical activity  and weight loss goals.   Meds ordered this encounter  Medications  . DISCONTD: amLODipine (NORVASC) 10 MG tablet    Sig: Take 1 tablet (10 mg total) by mouth daily. Must have office visit for refills    Dispense:  30 tablet    Refill:  3  . DISCONTD: glipiZIDE (GLUCOTROL) 5 MG tablet    Sig: Take 1 tablet (5 mg total) by mouth 2 (two) times daily before a meal.    Dispense:  60 tablet    Refill:  3    Discontinue previous dose  . cloNIDine (CATAPRES) 0.3 MG tablet    Sig: Take 1 tablet (0.3 mg total) by mouth 2 (two) times daily.    Dispense:  60 tablet    Refill:  3  . metoprolol (LOPRESSOR) 100 MG tablet    Sig: Take 1 tablet (100 mg total) by mouth 2 (two) times daily.    Dispense:  60 tablet    Refill:  3    Discontinue previous dose  . amLODipine (NORVASC) 10 MG tablet    Sig: Take 1 tablet (10  mg total) by mouth daily. Must have office visit for refills    Dispense:  15 tablet    Refill:  0  . glipiZIDE (GLUCOTROL) 5 MG tablet    Sig: Take 1 tablet (5 mg total) by mouth 2 (two) times daily before a meal.    Dispense:  30 tablet    Refill:  0    Discontinue previous dose  . glucose blood (ACCU-CHEK AVIVA) test strip    Sig: Use daily before breakfast    Dispense:  100 each    Refill:  5  . Lancet Devices (ACCU-CHEK SOFTCLIX) lancets    Sig: Use as instructed daily.    Dispense:  1 each    Refill:  5    Follow-up: Return in about 3 months (around 06/13/2016) for follow up on Diabetes Mellitus.   Jaclyn Shaggy MD

## 2016-03-13 NOTE — Progress Notes (Signed)
Medication refills Not testing his blood sugars at home Is interested in discussing testosterone

## 2016-03-13 NOTE — Patient Instructions (Signed)
Diabetes Mellitus and Food It is important for you to manage your blood sugar (glucose) level. Your blood glucose level can be greatly affected by what you eat. Eating healthier foods in the appropriate amounts throughout the day at about the same time each day will help you control your blood glucose level. It can also help slow or prevent worsening of your diabetes mellitus. Healthy eating may even help you improve the level of your blood pressure and reach or maintain a healthy weight.  General recommendations for healthful eating and cooking habits include:  Eating meals and snacks regularly. Avoid going long periods of time without eating to lose weight.  Eating a diet that consists mainly of plant-based foods, such as fruits, vegetables, nuts, legumes, and whole grains.  Using low-heat cooking methods, such as baking, instead of high-heat cooking methods, such as deep frying. Work with your dietitian to make sure you understand how to use the Nutrition Facts information on food labels. HOW CAN FOOD AFFECT ME? Carbohydrates Carbohydrates affect your blood glucose level more than any other type of food. Your dietitian will help you determine how many carbohydrates to eat at each meal and teach you how to count carbohydrates. Counting carbohydrates is important to keep your blood glucose at a healthy level, especially if you are using insulin or taking certain medicines for diabetes mellitus. Alcohol Alcohol can cause sudden decreases in blood glucose (hypoglycemia), especially if you use insulin or take certain medicines for diabetes mellitus. Hypoglycemia can be a life-threatening condition. Symptoms of hypoglycemia (sleepiness, dizziness, and disorientation) are similar to symptoms of having too much alcohol.  If your health care provider has given you approval to drink alcohol, do so in moderation and use the following guidelines:  Women should not have more than one drink per day, and men  should not have more than two drinks per day. One drink is equal to:  12 oz of beer.  5 oz of wine.  1 oz of hard liquor.  Do not drink on an empty stomach.  Keep yourself hydrated. Have water, diet soda, or unsweetened iced tea.  Regular soda, juice, and other mixers might contain a lot of carbohydrates and should be counted. WHAT FOODS ARE NOT RECOMMENDED? As you make food choices, it is important to remember that all foods are not the same. Some foods have fewer nutrients per serving than other foods, even though they might have the same number of calories or carbohydrates. It is difficult to get your body what it needs when you eat foods with fewer nutrients. Examples of foods that you should avoid that are high in calories and carbohydrates but low in nutrients include:  Trans fats (most processed foods list trans fats on the Nutrition Facts label).  Regular soda.  Juice.  Candy.  Sweets, such as cake, pie, doughnuts, and cookies.  Fried foods. WHAT FOODS CAN I EAT? Eat nutrient-rich foods, which will nourish your body and keep you healthy. The food you should eat also will depend on several factors, including:  The calories you need.  The medicines you take.  Your weight.  Your blood glucose level.  Your blood pressure level.  Your cholesterol level. You should eat a variety of foods, including:  Protein.  Lean cuts of meat.  Proteins low in saturated fats, such as fish, egg whites, and beans. Avoid processed meats.  Fruits and vegetables.  Fruits and vegetables that may help control blood glucose levels, such as apples, mangoes, and   yams.  Dairy products.  Choose fat-free or low-fat dairy products, such as milk, yogurt, and cheese.  Grains, bread, pasta, and rice.  Choose whole grain products, such as multigrain bread, whole oats, and brown rice. These foods may help control blood pressure.  Fats.  Foods containing healthful fats, such as nuts,  avocado, olive oil, canola oil, and fish. DOES EVERYONE WITH DIABETES MELLITUS HAVE THE SAME MEAL PLAN? Because every person with diabetes mellitus is different, there is not one meal plan that works for everyone. It is very important that you meet with a dietitian who will help you create a meal plan that is just right for you.   This information is not intended to replace advice given to you by your health care provider. Make sure you discuss any questions you have with your health care provider.   Document Released: 04/27/2005 Document Revised: 08/21/2014 Document Reviewed: 06/27/2013 Elsevier Interactive Patient Education 2016 Elsevier Inc.  

## 2016-03-15 NOTE — Telephone Encounter (Signed)
[phone]  - Spoke to [place], Pharmacologist....verified pt is to check his blood sugar each morning before breakfast. [place] confirmed information. No other questions at this time.

## 2016-03-23 ENCOUNTER — Other Ambulatory Visit: Payer: Medicare Other

## 2016-05-29 ENCOUNTER — Other Ambulatory Visit: Payer: Self-pay | Admitting: Internal Medicine

## 2016-07-17 ENCOUNTER — Encounter (HOSPITAL_COMMUNITY): Payer: Self-pay | Admitting: Emergency Medicine

## 2016-07-17 ENCOUNTER — Emergency Department (HOSPITAL_COMMUNITY): Payer: Medicare Other

## 2016-07-17 ENCOUNTER — Emergency Department (HOSPITAL_COMMUNITY)
Admission: EM | Admit: 2016-07-17 | Discharge: 2016-07-17 | Disposition: A | Discharge status: Home / Self Care | Payer: Medicare Other | Attending: Emergency Medicine | Admitting: Emergency Medicine

## 2016-07-17 DIAGNOSIS — S90851A Superficial foreign body, right foot, initial encounter: Secondary | ICD-10-CM | POA: Diagnosis not present

## 2016-07-17 DIAGNOSIS — Y92009 Unspecified place in unspecified non-institutional (private) residence as the place of occurrence of the external cause: Secondary | ICD-10-CM | POA: Insufficient documentation

## 2016-07-17 DIAGNOSIS — S91349A Puncture wound with foreign body, unspecified foot, initial encounter: Secondary | ICD-10-CM | POA: Insufficient documentation

## 2016-07-17 DIAGNOSIS — W228XXA Striking against or struck by other objects, initial encounter: Secondary | ICD-10-CM | POA: Diagnosis not present

## 2016-07-17 DIAGNOSIS — I10 Essential (primary) hypertension: Secondary | ICD-10-CM | POA: Insufficient documentation

## 2016-07-17 DIAGNOSIS — Y999 Unspecified external cause status: Secondary | ICD-10-CM | POA: Insufficient documentation

## 2016-07-17 DIAGNOSIS — Z7984 Long term (current) use of oral hypoglycemic drugs: Secondary | ICD-10-CM | POA: Diagnosis not present

## 2016-07-17 DIAGNOSIS — S99921A Unspecified injury of right foot, initial encounter: Secondary | ICD-10-CM | POA: Diagnosis not present

## 2016-07-17 DIAGNOSIS — Y9301 Activity, walking, marching and hiking: Secondary | ICD-10-CM | POA: Insufficient documentation

## 2016-07-17 DIAGNOSIS — Z7982 Long term (current) use of aspirin: Secondary | ICD-10-CM | POA: Diagnosis not present

## 2016-07-17 DIAGNOSIS — E119 Type 2 diabetes mellitus without complications: Secondary | ICD-10-CM | POA: Diagnosis not present

## 2016-07-17 DIAGNOSIS — Z79899 Other long term (current) drug therapy: Secondary | ICD-10-CM | POA: Diagnosis not present

## 2016-07-17 DIAGNOSIS — Z87891 Personal history of nicotine dependence: Secondary | ICD-10-CM | POA: Diagnosis not present

## 2016-07-17 MED ORDER — LIDOCAINE-EPINEPHRINE (PF) 2 %-1:200000 IJ SOLN
20.0000 mL | Freq: Once | INTRAMUSCULAR | Status: AC
  Administered 2016-07-17: 20 mL
  Filled 2016-07-17: qty 20

## 2016-07-17 MED ORDER — SULFAMETHOXAZOLE-TRIMETHOPRIM 800-160 MG PO TABS: 1.0000 | ORAL_TABLET | Freq: Two times a day (BID) | ORAL | 0 refills | Status: AC

## 2016-07-17 MED ORDER — TETANUS-DIPHTH-ACELL PERTUSSIS 5-2.5-18.5 LF-MCG/0.5 IM SUSP
0.5000 mL | Freq: Once | INTRAMUSCULAR | Status: AC
  Administered 2016-07-17: 0.5 mL via INTRAMUSCULAR
  Filled 2016-07-17: qty 0.5

## 2016-07-17 NOTE — ED Triage Notes (Signed)
Pt comes in stating that last night he stepped on what seemed like a plastic fork piece.  He got some of it out of his foot but when he stands feels like a piece still remains.  Pt is diabetic and wants to have this evaluated.

## 2016-07-17 NOTE — ED Provider Notes (Signed)
[facility]-EMERGENCY DEPT Provider Note   CSN: [id number] Arrival date & time: 07/17/16  0556     History   Chief Complaint Chief Complaint  Patient presents with  . Foot Injury    HPI Anthony Barnes is a 60 y.o. male with a pmhx of HTN, Dm who presents to the Ed today c/o possible foreign body in his right foot. Pt states that he thinks he stepped on a plastic fork last night while walking around his house barefoot. Pt began feeling pain on the bottom of his foot and when he looked he saw something sticking out of it. Pt states that he pulled a small amount of what looked like "the prong of a plastic fork" out of his foot. He never saw the fork. Pt states that he still has pain when he walks and feels as if something may still be in there. Pt is a known diabetic, well controlled. No hx of peripheral neuropathy. Last tetanus unknown.  HPI  Past Medical History:  Diagnosis Date  . Diabetes mellitus without complication (HCC)   . Hypertension     Patient Active Problem List   Diagnosis Date Noted  . Morbid obesity (HCC) 03/13/2016  . Dental caries 09/09/2015  . Diabetes (HCC) 10/20/2013  . DM (diabetes mellitus) type 2, uncontrolled, with ketoacidosis (HCC) 04/01/2013  . Essential hypertension, benign 04/01/2013  . GERD (gastroesophageal reflux disease) 04/01/2013  . Erosive gastritis 03/22/2013  . DM2 (diabetes mellitus, type 2) (HCC) 03/21/2013  . Unspecified essential hypertension 03/21/2013  . Hyponatremia 03/21/2013  . Epigastric pain 03/20/2013  . HTN (hypertension) 03/20/2013    Past Surgical History:  Procedure Laterality Date  . ESOPHAGOGASTRODUODENOSCOPY N/A 03/22/2013   Procedure: ESOPHAGOGASTRODUODENOSCOPY (EGD);  Surgeon: Graylin Shiver, MD;  Location: [facility] ENDOSCOPY;  Service: Endoscopy;  Laterality: N/A;       Home Medications    Prior to Admission medications   Medication Sig Start Date End Date Taking? Authorizing Provider  amLODipine (NORVASC) 10 MG  tablet Take 1 tablet (10 mg total) by mouth daily. Must have office visit for refills 03/13/16   Jaclyn Shaggy, MD  aspirin 325 MG tablet Take 1 tablet (325 mg total) by mouth daily. 04/01/13   Quentin Angst, MD  cloNIDine (CATAPRES) 0.3 MG tablet Take 1 tablet (0.3 mg total) by mouth 2 (two) times daily. 03/13/16   Jaclyn Shaggy, MD  glipiZIDE (GLUCOTROL) 5 MG tablet Take 1 tablet (5 mg total) by mouth 2 (two) times daily before a meal. 03/13/16   Jaclyn Shaggy, MD  glucose blood (ACCU-CHEK AVIVA) test strip Use daily before breakfast 03/13/16   Jaclyn Shaggy, MD  Lancet Devices [facility]) lancets Use as instructed daily. 03/13/16   Jaclyn Shaggy, MD  lisinopril (PRINIVIL,ZESTRIL) 40 MG tablet Take 1 tablet (40 mg total) by mouth daily. 05/29/16   Jaclyn Shaggy, MD  metoprolol (LOPRESSOR) 100 MG tablet Take 1 tablet (100 mg total) by mouth 2 (two) times daily. 03/13/16   Jaclyn Shaggy, MD  Multiple Vitamin (MULTIVITAMIN WITH MINERALS) TABS tablet Take 1 tablet by mouth daily.    Historical Provider, MD  OVER THE COUNTER MEDICATION Take 3 tablets by mouth every other day. Reported on 09/09/2015    Historical Provider, MD  pantoprazole (PROTONIX) 40 MG tablet Take 1 tablet (40 mg total) by mouth 2 (two) times daily. Patient not taking: Reported on 11/06/2014 10/16/14   Ambrose Finland, NP  sulfamethoxazole-trimethoprim (BACTRIM DS,SEPTRA DS) 800-160 MG tablet Take 1 tablet  by mouth 2 (two) times daily. 07/17/16 07/24/16  Dub MikesSamantha Tripp Dowless, PA-C    Family History Family History  Problem Relation Age of Onset  . Hypertension Mother   . Cancer Mother [id number]    ovarian  . Diabetes Father   . Stroke Father     Social History Social History  Substance Use Topics  . Smoking status: Former Smoker    Packs/day: 1.00    Years: 16.00    Types: Cigarettes    Start date: 08/14/1974    Quit date: 08/14/1990  . Smokeless tobacco: Former NeurosurgeonUser    Quit date: 08/14/1990  . Alcohol use 2.4 oz/week    2 Cans  of beer, 2 Shots of liquor per week     Allergies   Shellfish allergy and Codeine   Review of Systems Review of Systems  All other systems reviewed and are negative.    Physical Exam Updated Vital Signs BP 152/95   Pulse 64   Temp 97.8 F (36.6 C) (Oral)   Resp 18   SpO2 100%   Physical Exam  Constitutional: He is oriented to person, place, and time. He appears well-developed and well-nourished. No distress.  HENT:  Head: Normocephalic and atraumatic.  Eyes: Conjunctivae are normal. Right eye exhibits no discharge. Left eye exhibits no discharge. No scleral icterus.  Cardiovascular: Normal rate.   Pulmonary/Chest: Effort normal.  Neurological: He is alert and oriented to person, place, and time. Coordination normal.  Skin: Skin is warm and dry. No rash noted. He is not diaphoretic. No erythema. No pallor.  Small puncture wound to plantar aspect of R foot. No FB palpable. No purulent drainage.  Psychiatric: He has a normal mood and affect. His behavior is normal.  Nursing note and vitals reviewed.    ED Treatments / Results  Labs (all labs ordered are listed, but only abnormal results are displayed) Labs Reviewed - No data to display  EKG  EKG Interpretation None       Radiology Dg Foot Complete Right  Result Date: 07/17/2016 CLINICAL DATA:  Stepped on plastic fork, assess for foreign body. EXAM: RIGHT FOOT COMPLETE - 3+ VIEW COMPARISON:  None. FINDINGS: No acute fracture deformity or dislocation. Joint space intact without erosions. Moderate midfoot osteoarthrosis. No destructive bony lesions. Soft tissue planes are not suspicious. IMPRESSION: No acute osseous process.  No radiopaque foreign bodies. Electronically Signed   By: Awilda Metroourtnay  Bloomer M.D.   On: 07/17/2016 06:29    Procedures .Foreign Body Removal Date/Time: 07/17/2016 7:22 AM Performed by: Gaylyn RongWLESS, SAMANTHA TRIPP Authorized by: Gaylyn RongWLESS, SAMANTHA TRIPP  Consent: Verbal consent obtained. Risks and  benefits: risks, benefits and alternatives were discussed Consent given by: patient Patient understanding: patient states understanding of the procedure being performed Patient consent: the patient's understanding of the procedure matches consent given Site marked: the operative site was marked Required items: required blood products, implants, devices, and special equipment available Patient identity confirmed: verbally with patient Body area: skin General location: lower extremity Location details: right foot Anesthesia: local infiltration  Anesthesia: Local Anesthetic: lidocaine 1% with epinephrine Anesthetic total: 5 mL  Sedation: Patient sedated: no Patient restrained: no Patient cooperative: yes Localization method: visualized Removal mechanism: hemostat and scalpel Dressing: antibiotic ointment and dressing applied Tendon involvement: none Depth: subcutaneous Complexity: simple 1 objects recovered. Objects recovered: 1 in splinter Post-procedure assessment: foreign body removed Patient tolerance: Patient tolerated the procedure well with no immediate complications   (including critical care time)    Medications  Ordered in ED Medications  lidocaine-EPINEPHrine (XYLOCAINE W/EPI) 2 %-1:200000 (PF) injection 20 mL (20 mLs Infiltration Given by Other 07/17/16 0647)  Tdap (BOOSTRIX) injection 0.5 mL (0.5 mLs Intramuscular Given 07/17/16 0647)     Initial Impression / Assessment and Plan / ED Course  I have reviewed the triage vital signs and the nursing notes.  Pertinent labs & imaging results that were available during my care of the patient were reviewed by me and considered in my medical decision making (see chart for details).  Clinical Course     60 y.o M with pmhx of Dm presents to the ED for possible FB retained in plantar aspect of R foot. Xray unremarkable. Area was numbed and probed with scalped. Small <1cm incision made. 1 in long splinter removed with  hemostat. Tdap updated. Will d/c with Bactrim. Last renal function reviewed and was normal. Pt will follow up with PCP within 1 week for wound re-check. Return precautions outlined in patient discharge instructions.   Patient was discussed with and seen by Dr. Patria Maneampos who agrees with the treatment plan.    Final Clinical Impressions(s) / ED Diagnoses   Final diagnoses:  Foreign body in right foot, initial encounter    New Prescriptions New Prescriptions   SULFAMETHOXAZOLE-TRIMETHOPRIM (BACTRIM DS,SEPTRA DS) 800-160 MG TABLET    Take 1 tablet by mouth 2 (two) times daily.     Lester KinsmanSamantha Tripp SilverstreetDowless, PA-C 07/17/16 [id number]    Azalia BilisKevin [name], MD 07/17/16 [phone]

## 2016-07-17 NOTE — Discharge Instructions (Signed)
Take antibiotics as prescribed. Follow up with your PCP within 1 week for a wound re-check. Take ibuprofen or tylenol as needed for pain. Wash wound with antibacterial soap in water. Return to the ED if you experience severe redness and swelling around your wound, fevers, chills.

## 2016-07-21 ENCOUNTER — Encounter: Payer: Self-pay | Admitting: Physician Assistant

## 2016-07-21 ENCOUNTER — Ambulatory Visit: Payer: Medicare Other | Attending: Family Medicine | Admitting: Physician Assistant

## 2016-07-21 VITALS — BP 144/91 | HR 65 | Temp 98.1°F | Resp 16 | Wt 328.2 lb

## 2016-07-21 DIAGNOSIS — I10 Essential (primary) hypertension: Secondary | ICD-10-CM | POA: Insufficient documentation

## 2016-07-21 DIAGNOSIS — Z794 Long term (current) use of insulin: Secondary | ICD-10-CM | POA: Diagnosis not present

## 2016-07-21 DIAGNOSIS — Z888 Allergy status to other drugs, medicaments and biological substances status: Secondary | ICD-10-CM | POA: Insufficient documentation

## 2016-07-21 DIAGNOSIS — E1310 Other specified diabetes mellitus with ketoacidosis without coma: Secondary | ICD-10-CM | POA: Diagnosis not present

## 2016-07-21 DIAGNOSIS — E782 Mixed hyperlipidemia: Secondary | ICD-10-CM | POA: Insufficient documentation

## 2016-07-21 DIAGNOSIS — Z7982 Long term (current) use of aspirin: Secondary | ICD-10-CM | POA: Insufficient documentation

## 2016-07-21 DIAGNOSIS — E119 Type 2 diabetes mellitus without complications: Secondary | ICD-10-CM | POA: Insufficient documentation

## 2016-07-21 DIAGNOSIS — E1110 Type 2 diabetes mellitus with ketoacidosis without coma: Secondary | ICD-10-CM

## 2016-07-21 DIAGNOSIS — Z91013 Allergy to seafood: Secondary | ICD-10-CM | POA: Insufficient documentation

## 2016-07-21 DIAGNOSIS — Z79899 Other long term (current) drug therapy: Secondary | ICD-10-CM | POA: Diagnosis not present

## 2016-07-21 DIAGNOSIS — M25571 Pain in right ankle and joints of right foot: Secondary | ICD-10-CM

## 2016-07-21 LAB — COMPREHENSIVE METABOLIC PANEL
ALK PHOS: 48 U/L (ref 40–115)
ALT: 28 U/L (ref 9–46)
AST: 20 U/L (ref 10–35)
Albumin: 4 g/dL (ref 3.6–5.1)
BILIRUBIN TOTAL: 0.3 mg/dL (ref 0.2–1.2)
BUN: 13 mg/dL (ref 7–25)
CALCIUM: 8.9 mg/dL (ref 8.6–10.3)
CO2: 25 mmol/L (ref 20–31)
Chloride: 102 mmol/L (ref 98–110)
Creat: 1.11 mg/dL (ref 0.70–1.33)
GLUCOSE: 173 mg/dL — AB (ref 65–99)
POTASSIUM: 4.6 mmol/L (ref 3.5–5.3)
Sodium: 136 mmol/L (ref 135–146)
TOTAL PROTEIN: 7.3 g/dL (ref 6.1–8.1)

## 2016-07-21 LAB — LIPID PANEL
CHOL/HDL RATIO: 3.2 ratio (ref <5.0)
CHOLESTEROL: 123 mg/dL (ref <200)
HDL: 39 mg/dL — AB (ref >40)
LDL Cholesterol: 74 mg/dL (ref <100)
Triglycerides: 50 mg/dL (ref <150)
VLDL: 10 mg/dL (ref <30)

## 2016-07-21 LAB — POCT GLYCOSYLATED HEMOGLOBIN (HGB A1C): Hemoglobin A1C: 7.6

## 2016-07-21 LAB — GLUCOSE, POCT (MANUAL RESULT ENTRY): POC GLUCOSE: 174 mg/dL — AB (ref 70–99)

## 2016-07-21 MED ORDER — GLIPIZIDE 5 MG PO TABS: 5.0000 mg | ORAL_TABLET | Freq: Two times a day (BID) | ORAL | 3 refills | Status: DC

## 2016-07-21 MED ORDER — METOPROLOL TARTRATE 100 MG PO TABS: 100.0000 mg | ORAL_TABLET | Freq: Two times a day (BID) | ORAL | 3 refills | Status: DC

## 2016-07-21 MED ORDER — AMLODIPINE BESYLATE 10 MG PO TABS: 10.0000 mg | ORAL_TABLET | Freq: Every day | ORAL | 3 refills | Status: DC

## 2016-07-21 MED ORDER — LISINOPRIL 40 MG PO TABS: 40.0000 mg | ORAL_TABLET | Freq: Every day | ORAL | 2 refills | Status: DC

## 2016-07-21 MED ORDER — CLONIDINE HCL 0.3 MG PO TABS: 0.3000 mg | ORAL_TABLET | Freq: Two times a day (BID) | ORAL | 3 refills | Status: DC

## 2016-07-21 NOTE — Progress Notes (Signed)
Anthony Barnes, is a 60 y.o. male  WUJ:[id number]  NFA:[id number]  DOB - 1956/04/15  Subjective:  Chief Complaint and HPI: Anthony Barnes is a 60 y.o. male here today for a follow up visit after being seen and treated for a FB in his R foot in the ED on 07/17/2016.  A FB was removed, his tetanus was updated, and he was put on antibiotics.  He is diabetic.  He is feeling much better and has a few days left of antibiotics.  No labs in a while.  He states he is compliant on his medications.  He denies S/Sx of hyper/hypoglycemia.  He denies any CP/HA/SOB.  No f/c.  ED/Hospital notes reviewed.     ROS:   Constitutional:  No f/c, No night sweats, No unexplained weight loss. EENT:  No vision changes, No blurry vision, No hearing changes. No mouth, throat, or ear problems.  Respiratory: No cough, No SOB Cardiac: No CP, no palpitations GI:  No abd pain, No N/V/D. GU: No Urinary s/sx Musculoskeletal: No joint pain Neuro: No headache, no dizziness, no motor weakness.  Skin: No rash Endocrine:  No polydipsia. No polyuria.  Psych: Denies SI/HI  No problems updated.  ALLERGIES: Allergies  Allergen Reactions  . Shellfish Allergy Anaphylaxis  . Codeine Itching    unknown    PAST MEDICAL HISTORY: Past Medical History:  Diagnosis Date  . Diabetes mellitus without complication (HCC)   . Hypertension     MEDICATIONS AT HOME: Prior to Admission medications   Medication Sig Start Date End Date Taking? Authorizing Provider  amLODipine (NORVASC) 10 MG tablet Take 1 tablet (10 mg total) by mouth daily. 07/21/16  Yes Anders SimmondsAngela M Kate Larock, PA-C  aspirin 325 MG tablet Take 1 tablet (325 mg total) by mouth daily. 04/01/13  Yes Quentin Angstlugbemiga E Jegede, MD  cloNIDine (CATAPRES) 0.3 MG tablet Take 1 tablet (0.3 mg total) by mouth 2 (two) times daily. 07/21/16  Yes Marzella SchleinAngela M Alphonsine Minium, PA-C  glipiZIDE (GLUCOTROL) 5 MG tablet Take 1 tablet (5 mg total) by mouth 2 (two) times daily before a meal. 07/21/16  Yes  Marzella SchleinAngela M Lanetta Figuero, PA-C  lisinopril (PRINIVIL,ZESTRIL) 40 MG tablet Take 1 tablet (40 mg total) by mouth daily. 07/21/16  Yes Marzella SchleinAngela M Ison Wichmann, PA-C  metoprolol (LOPRESSOR) 100 MG tablet Take 1 tablet (100 mg total) by mouth 2 (two) times daily. 07/21/16  Yes Anders SimmondsAngela M Marla Pouliot, PA-C  Multiple Vitamin (MULTIVITAMIN WITH MINERALS) TABS tablet Take 1 tablet by mouth daily.   Yes Historical Provider, MD  pantoprazole (PROTONIX) 40 MG tablet Take 1 tablet (40 mg total) by mouth 2 (two) times daily. 10/16/14  Yes Ambrose FinlandValerie A Keck, NP  glucose blood (ACCU-CHEK AVIVA) test strip Use daily before breakfast Patient not taking: Reported on 07/21/2016 03/13/16   Jaclyn ShaggyEnobong Amao, MD  Lancet Devices [facility](ACCU-CHEK SOFTCLIX) lancets Use as instructed daily. Patient not taking: Reported on 07/21/2016 03/13/16   Jaclyn ShaggyEnobong Amao, MD  OVER THE COUNTER MEDICATION Take 3 tablets by mouth every other day. Reported on 09/09/2015    Historical Provider, MD  sulfamethoxazole-trimethoprim (BACTRIM DS,SEPTRA DS) 800-160 MG tablet Take 1 tablet by mouth 2 (two) times daily. Patient not taking: Reported on 07/21/2016 07/17/16 07/24/16  Samantha Tripp Dowless, PA-C     Objective:  EXAM:   Vitals:   07/21/16 1023  BP: (!) 144/91  Pulse: 65  Resp: 16  Temp: 98.1 F (36.7 C)  TempSrc: Oral  SpO2: 95%  Weight: (!) 328 lb 3.2 oz (  148.9 kg)    General appearance : A&OX3. NAD. Non-toxic-appearing HEENT: Atraumatic and Normocephalic.  PERRLA. EOM intact.  TM clear B. Mouth-MMM, post pharynx WNL w/o erythema, No PND. Neck: supple, no JVD. No cervical lymphadenopathy. No thyromegaly Chest/Lungs:  Breathing-non-labored, Good air entry bilaterally, breath sounds normal without rales, rhonchi, or wheezing  CVS: S1 S2 regular, no murmurs, gallops, rubs Extremities: Bilateral Lower Ext shows no edema, both legs are warm to touch with = pulse throughout Neurology:  CN II-XII grossly intact, Non focal.  R foot examined.  Proximal plantar surface with  what apprears to be a puncture wound that is closed and not draining.  There is no surrounding erythema.  There is no fluctuance with manipulation.  No purulence expressed with pressure.  The skin is warm, dry and in tact.  Good vascularization.  Normal capillary RF.  No swelling or streaking into other parts of foot or into ankle/leg.  Pulses=B Psych:  TP linear. J/I WNL. Normal speech. Appropriate eye contact and affect.  Skin:  No Rash  Data Review Lab Results  Component Value Date   HGBA1C 7.5 03/13/2016   HGBA1C 7.90 09/09/2015   HGBA1C 8.0 10/16/2014  A1C=7.6 today   Assessment & Plan   1. Type 2 diabetes mellitus without complication, with long-term current use of insulin (HCC) Suboptimal control.  Work harder on food choices/low carbs.  Long discussion and counseling on this. I have had a lengthy discussion and provided education about insulin resistance and the intake of too much sugar/refined carbohydrates.  I have advised the patient to work at a goal of eliminating sugary drinks, candy, desserts, sweets, refined sugars, processed foods, and white carbohydrates.  The patient expresses understanding.  No adjustments to meds today-this can be done if indicated at f/up with PCP in 1 month - POCT glucose (manual entry) - POCT glycosylated hemoglobin (Hb A1C) - glipiZIDE (GLUCOTROL) 5 MG tablet; Take 1 tablet (5 mg total) by mouth 2 (two) times daily before a meal.  Dispense: 60 tablet; Refill: 3 - Comprehensive metabolic panel  2. Essential hypertension, benign Uncontrolled.  Work on modifiable factors-weight, etc. We have discussed target BP range and blood pressure goal. I have advised patient to check BP regularly and to call us back or report to clinic if the numbers are consistently higher than 140/90. We discussed the importance of compliance with medical therapy and DASH diet recommended, consequences of uncontrolled hypertension discussed.  No changes today-deferred to PCP  at f/up - metoprolol (LOPRESSOR) 100 MG tablet; Take 1 tablet (100 mg total) by mouth 2 (two) times daily.  Dispense: 60 tablet; Refill: 3 - cloNIDine (CATAPRES) 0.3 MG tablet; Take 1 tablet (0.3 mg total) by mouth 2 (two) times daily.  Dispense: 60 tablet; Refill: 3 - amLODipine (NORVASC) 10 MG tablet; Take 1 tablet (10 mg total) by mouth daily.  Dispense: 30 tablet; Refill: 3  3. Morbid obesity (HCC) Counseling as above  4. Foot pain s/p FB removal-pain improving; no sign of infection.  Finish antibiotics.  5. Mixed hyperlipidemia - Comprehensive metabolic panel - Lipid panel     Patient have been counseled extensively about nutrition and exercise  Return in about 4 weeks (around 08/18/2016) for with PCP for DM;htn management/check up.  The patient was given clear instructions to go to ER or return to medical center if symptoms don't improve, worsen or new problems develop. The patient verbalized understanding. The patient was told to call to get lab results if they  haven't heard anything in the next week.     Anthony Co, PA-C [facility] [place], [place] [phone]   07/21/2016, 10:52 AMPatient ID: Arnetha Massy, male   DOB: 06-11-1956, 60 y.o.   MRN: [id number]

## 2016-07-21 NOTE — Patient Instructions (Signed)
Carbohydrate Counting for Diabetes Mellitus, Adult Carbohydrate counting is a method for keeping track of how many carbohydrates you eat. Eating carbohydrates naturally increases the amount of sugar (glucose) in the blood. Counting how many carbohydrates you eat helps keep your blood glucose within normal limits, which helps you manage your diabetes (diabetes mellitus). It is important to know how many carbohydrates you can safely have in each meal. This is different for every person. A diet and nutrition specialist (registered dietitian) can help you make a meal plan and calculate how many carbohydrates you should have at each meal and snack. Carbohydrates are found in the following foods:  Grains, such as breads and cereals.  Dried beans and soy products.  Starchy vegetables, such as potatoes, peas, and corn.  Fruit and fruit juices.  Milk and yogurt.  Sweets and snack foods, such as cake, cookies, candy, chips, and soft drinks. How do I count carbohydrates? There are two ways to count carbohydrates in food. You can use either of the methods or a combination of both. Reading "Nutrition Facts" on packaged food  The "Nutrition Facts" list is included on the labels of almost all packaged foods and beverages in the [place] It includes:  The serving size.  Information about nutrients in each serving, including the grams (g) of carbohydrate per serving. To use the "Nutrition Facts":  Decide how many servings you will have.  Multiply the number of servings by the number of carbohydrates per serving.  The resulting number is the total amount of carbohydrates that you will be having. Learning standard serving sizes of other foods  When you eat foods containing carbohydrates that are not packaged or do not include "Nutrition Facts" on the label, you need to measure the servings in order to count the amount of carbohydrates:  Measure the foods that you will eat with a food scale or measuring  cup, if needed.  Decide how many standard-size servings you will eat.  Multiply the number of servings by 15. Most carbohydrate-rich foods have about 15 g of carbohydrates per serving.  For example, if you eat 8 oz (170 g) of strawberries, you will have eaten 2 servings and 30 g of carbohydrates (2 servings x 15 g = 30 g).  For foods that have more than one food mixed, such as soups and casseroles, you must count the carbohydrates in each food that is included. The following list contains standard serving sizes of common carbohydrate-rich foods. Each of these servings has about 15 g of carbohydrates:   hamburger bun or  English muffin.   oz (15 mL) syrup.   oz (14 g) jelly.  1 slice of bread.  1 six-inch tortilla.  3 oz (85 g) cooked rice or pasta.  4 oz (113 g) cooked dried beans.  4 oz (113 g) starchy vegetable, such as peas, corn, or potatoes.  4 oz (113 g) hot cereal.  4 oz (113 g) mashed potatoes or  of a large baked potato.  4 oz (113 g) canned or frozen fruit.  4 oz (120 mL) fruit juice.  4-6 crackers.  6 chicken nuggets.  6 oz (170 g) unsweetened dry cereal.  6 oz (170 g) plain fat-free yogurt or yogurt sweetened with artificial sweeteners.  8 oz (240 mL) milk.  8 oz (170 g) fresh fruit or one small piece of fruit.  24 oz (680 g) popped popcorn. Example of carbohydrate counting Sample meal  3 oz (85 g) chicken breast.  6 oz (  170 g) brown rice.  4 oz (113 g) corn.  8 oz (240 mL) milk.  8 oz (170 g) strawberries with sugar-free whipped topping. Carbohydrate calculation 1. Identify the foods that contain carbohydrates:  Rice.  Corn.  Milk.  Strawberries. 2. Calculate how many servings you have of each food:  2 servings rice.  1 serving corn.  1 serving milk.  1 serving strawberries. 3. Multiply each number of servings by 15 g:  2 servings rice x 15 g = 30 g.  1 serving corn x 15 g = 15 g.  1 serving milk x 15 g = 15  g.  1 serving strawberries x 15 g = 15 g. 4. Add together all of the amounts to find the total grams of carbohydrates eaten:  30 g + 15 g + 15 g + 15 g = 75 g of carbohydrates total. This information is not intended to replace advice given to you by your health care provider. Make sure you discuss any questions you have with your health care provider. Document Released: 07/31/2005 Document Revised: 02/18/2016 Document Reviewed: 01/12/2016 Elsevier Interactive Patient Education  2017 Elsevier Inc.  

## 2016-11-09 ENCOUNTER — Telehealth: Payer: Self-pay | Admitting: Family Medicine

## 2016-11-09 DIAGNOSIS — I10 Essential (primary) hypertension: Secondary | ICD-10-CM

## 2016-11-09 DIAGNOSIS — E119 Type 2 diabetes mellitus without complications: Secondary | ICD-10-CM

## 2016-11-09 DIAGNOSIS — K219 Gastro-esophageal reflux disease without esophagitis: Secondary | ICD-10-CM

## 2016-11-09 DIAGNOSIS — Z794 Long term (current) use of insulin: Secondary | ICD-10-CM

## 2016-11-09 MED ORDER — AMLODIPINE BESYLATE 10 MG PO TABS: 10.0000 mg | ORAL_TABLET | Freq: Every day | ORAL | 0 refills | Status: DC

## 2016-11-09 MED ORDER — GLIPIZIDE 5 MG PO TABS: 5.0000 mg | ORAL_TABLET | Freq: Two times a day (BID) | ORAL | 0 refills | Status: DC

## 2016-11-09 MED ORDER — PANTOPRAZOLE SODIUM 40 MG PO TBEC: 40.0000 mg | DELAYED_RELEASE_TABLET | Freq: Two times a day (BID) | ORAL | 0 refills | Status: DC

## 2016-11-09 MED ORDER — LISINOPRIL 40 MG PO TABS: 40.0000 mg | ORAL_TABLET | Freq: Every day | ORAL | 0 refills | Status: DC

## 2016-11-09 MED ORDER — METOPROLOL TARTRATE 100 MG PO TABS: 100.0000 mg | ORAL_TABLET | Freq: Two times a day (BID) | ORAL | 0 refills | Status: DC

## 2016-11-09 NOTE — Telephone Encounter (Signed)
Pt calling to request refill on all current meds. Has appt scheduled for 11/29/16 but needs meds to last until that date. Please f/u with pt.

## 2016-11-09 NOTE — Telephone Encounter (Signed)
Refilled all chronic medications x 30 days

## 2016-11-29 ENCOUNTER — Encounter: Payer: Self-pay | Admitting: Family Medicine

## 2016-11-29 ENCOUNTER — Ambulatory Visit: Payer: Medicare Other | Attending: Family Medicine | Admitting: Family Medicine

## 2016-11-29 VITALS — BP 157/92 | HR 61 | Temp 98.0°F | Ht 76.0 in | Wt 332.4 lb

## 2016-11-29 DIAGNOSIS — Z885 Allergy status to narcotic agent status: Secondary | ICD-10-CM | POA: Diagnosis not present

## 2016-11-29 DIAGNOSIS — K219 Gastro-esophageal reflux disease without esophagitis: Secondary | ICD-10-CM | POA: Insufficient documentation

## 2016-11-29 DIAGNOSIS — Z79899 Other long term (current) drug therapy: Secondary | ICD-10-CM | POA: Diagnosis not present

## 2016-11-29 DIAGNOSIS — Z91013 Allergy to seafood: Secondary | ICD-10-CM | POA: Insufficient documentation

## 2016-11-29 DIAGNOSIS — Z794 Long term (current) use of insulin: Secondary | ICD-10-CM | POA: Diagnosis not present

## 2016-11-29 DIAGNOSIS — I10 Essential (primary) hypertension: Secondary | ICD-10-CM | POA: Diagnosis not present

## 2016-11-29 DIAGNOSIS — E119 Type 2 diabetes mellitus without complications: Secondary | ICD-10-CM | POA: Diagnosis not present

## 2016-11-29 DIAGNOSIS — Z7982 Long term (current) use of aspirin: Secondary | ICD-10-CM | POA: Insufficient documentation

## 2016-11-29 DIAGNOSIS — E1110 Type 2 diabetes mellitus with ketoacidosis without coma: Secondary | ICD-10-CM | POA: Diagnosis not present

## 2016-11-29 LAB — GLUCOSE, POCT (MANUAL RESULT ENTRY): POC Glucose: 189 mg/dl — AB (ref 70–99)

## 2016-11-29 LAB — POCT GLYCOSYLATED HEMOGLOBIN (HGB A1C): Hemoglobin A1C: 8.2

## 2016-11-29 MED ORDER — GLIPIZIDE 5 MG PO TABS: 5.0000 mg | ORAL_TABLET | Freq: Two times a day (BID) | ORAL | 5 refills | Status: DC

## 2016-11-29 MED ORDER — AMLODIPINE BESYLATE 10 MG PO TABS: 10.0000 mg | ORAL_TABLET | Freq: Every day | ORAL | 5 refills | Status: DC

## 2016-11-29 MED ORDER — CLONIDINE HCL 0.3 MG PO TABS: 0.3000 mg | ORAL_TABLET | Freq: Two times a day (BID) | ORAL | 5 refills | Status: DC

## 2016-11-29 MED ORDER — LISINOPRIL 40 MG PO TABS: 40.0000 mg | ORAL_TABLET | Freq: Every day | ORAL | 5 refills | Status: DC

## 2016-11-29 MED ORDER — PANTOPRAZOLE SODIUM 40 MG PO TBEC: 40.0000 mg | DELAYED_RELEASE_TABLET | Freq: Every day | ORAL | 5 refills | Status: DC

## 2016-11-29 MED ORDER — CARVEDILOL 12.5 MG PO TABS: 12.5000 mg | ORAL_TABLET | Freq: Two times a day (BID) | ORAL | 5 refills | Status: DC

## 2016-11-29 MED ORDER — GLIPIZIDE 10 MG PO TABS: 10.0000 mg | ORAL_TABLET | Freq: Two times a day (BID) | ORAL | 5 refills | Status: DC

## 2016-11-29 NOTE — Patient Instructions (Signed)

## 2016-11-29 NOTE — Progress Notes (Signed)
Subjective:  Patient ID: Anthony Barnes, male    DOB: 22-Jul-1956  Age: 61 y.o. MRN: [id number]  CC: Hypertension and Diabetes   HPI Anthony Barnes is a 61 year old male with a history of hypertension, type 2 diabetes mellitus A1c 8.2 which is up from 7.5 , morbid obesity who comes into the clinic for follow-up visit.  He has been compliant with his medications but has not been exercising. Has not been checking his blood sugar log but denies hypoglycemic episodes. Denies numbness in extremities He was last seen in the clinic 9 months ago and during this period had intermittent periods where he ran out of his medications. He has not been adhering to a diabetic diet.  His blood pressure is elevated and he endorses compliance with all his antihypertensives.  Denies chest pains or shortness of breath. Has no concerns today.  Past Medical History:  Diagnosis Date  . Diabetes mellitus without complication ([place])   . Hypertension     Past Surgical History:  Procedure Laterality Date  . ESOPHAGOGASTRODUODENOSCOPY N/A 03/22/2013   Procedure: ESOPHAGOGASTRODUODENOSCOPY (EGD);  Surgeon: Wonda Horner, MD;  [facility] ENDOSCOPY;  Service: Endoscopy;  Laterality: N/A;    Allergies  Allergen Reactions  . Shellfish Allergy Anaphylaxis  . Codeine Itching    unknown     Outpatient Medications Prior to Visit  Medication Sig Dispense Refill  . aspirin 325 MG tablet Take 1 tablet (325 mg total) by mouth daily. 30 tablet 3  . Multiple Vitamin (MULTIVITAMIN WITH MINERALS) TABS tablet Take 1 tablet by mouth daily.    Marland Kitchen amLODipine (NORVASC) 10 MG tablet Take 1 tablet (10 mg total) by mouth daily. 30 tablet 0  . cloNIDine (CATAPRES) 0.3 MG tablet Take 1 tablet (0.3 mg total) by mouth 2 (two) times daily. 60 tablet 3  . glipiZIDE (GLUCOTROL) 5 MG tablet Take 1 tablet (5 mg total) by mouth 2 (two) times daily before a meal. 60 tablet 0  . lisinopril (PRINIVIL,ZESTRIL) 40 MG tablet Take 1  tablet (40 mg total) by mouth daily. 30 tablet 0  . metoprolol (LOPRESSOR) 100 MG tablet Take 1 tablet (100 mg total) by mouth 2 (two) times daily. 60 tablet 0  . glucose blood (ACCU-CHEK AVIVA) test strip Use daily before breakfast (Patient not taking: Reported on 11/29/2016) 100 each 5  . Lancet Devices (ACCU-CHEK SOFTCLIX) lancets Use as instructed daily. (Patient not taking: Reported on 07/21/2016) 1 each 5  . OVER THE COUNTER MEDICATION Take 3 tablets by mouth every other day. Reported on 09/09/2015    . pantoprazole (PROTONIX) 40 MG tablet Take 1 tablet (40 mg total) by mouth 2 (two) times daily. (Patient not taking: Reported on 11/29/2016) 60 tablet 0   No facility-administered medications prior to visit.     ROS Review of Systems  Constitutional: Negative for activity change and appetite change.  HENT: Negative for sinus pressure and sore throat.   Eyes: Negative for visual disturbance.  Respiratory: Negative for cough, chest tightness and shortness of breath.   Cardiovascular: Negative for chest pain and leg swelling.  Gastrointestinal: Negative for abdominal distention, abdominal pain, constipation and diarrhea.  Endocrine: Negative.   Genitourinary: Negative for dysuria.  Musculoskeletal: Negative for joint swelling and myalgias.  Skin: Negative for rash.  Allergic/Immunologic: Negative.   Neurological: Negative for weakness, light-headedness and numbness.  Psychiatric/Behavioral: Negative for dysphoric mood and suicidal ideas.    Objective:  BP (!) 157/92 (BP Location: Right Arm, Patient Position:  Sitting, Cuff Size: Large)   Pulse 61   Temp 98 F (36.7 C) (Oral)   Ht 6' 4"  (1.93 m)   Wt (!) 332 lb 6.4 oz (150.8 kg)   SpO2 94%   BMI 40.46 kg/m   BP/Weight 11/29/2016 07/21/2016 16/08/958  Systolic BP 454 098 119  Diastolic BP 92 91 88  Wt. (Lbs) 332.4 328.2 -  BMI 40.46 39.95 -      Physical Exam  Constitutional: He is oriented to person, place, and time. He  appears well-developed and well-nourished.  Cardiovascular: Normal rate, normal heart sounds and intact distal pulses.   No murmur heard. Pulmonary/Chest: Effort normal and breath sounds normal. He has no wheezes. He has no rales. He exhibits no tenderness.  Abdominal: Soft. Bowel sounds are normal. He exhibits no distension and no mass. There is no tenderness.  Musculoskeletal: Normal range of motion.  Neurological: He is alert and oriented to person, place, and time.  Skin: Skin is warm and dry.  Psychiatric: He has a normal mood and affect.    Lab Results  Component Value Date   HGBA1C 8.2 11/29/2016    Assessment & Plan:   1. Gastroesophageal reflux disease without esophagitis Stable - pantoprazole (PROTONIX) 40 MG tablet; Take 1 tablet (40 mg total) by mouth daily.  Dispense: 30 tablet; Refill: 5  2. Essential hypertension Uncontrolled Switch from metoprolol to Carvedilol - lisinopril (PRINIVIL,ZESTRIL) 40 MG tablet; Take 1 tablet (40 mg total) by mouth daily.  Dispense: 30 tablet; Refill: 5 - cloNIDine (CATAPRES) 0.3 MG tablet; Take 1 tablet (0.3 mg total) by mouth 2 (two) times daily.  Dispense: 60 tablet; Refill: 5 - amLODipine (NORVASC) 10 MG tablet; Take 1 tablet (10 mg total) by mouth daily.  Dispense: 30 tablet; Refill: 5  3. Type 2 diabetes mellitus without complication, with long-term current use of insulin (HCC) Uncontrolled with A1c of 8.2 Increase dose of Glipizide Discuss initiation of statin at next visitAs well as diabetic health care maintenance. - Glucose (CBG) - HgB A1c - CMP14+EGFR - glipiZIDE (GLUCOTROL) 10 MG tablet; Take 1 tablet (10 mg total) by mouth 2 (two) times daily before a meal.  Dispense: 60 tablet; Refill: 5  4. Essential hypertension, benign Low sodium diet - cloNIDine (CATAPRES) 0.3 MG tablet; Take 1 tablet (0.3 mg total) by mouth 2 (two) times daily.  Dispense: 60 tablet; Refill: 5 - amLODipine (NORVASC) 10 MG tablet; Take 1 tablet (10  mg total) by mouth daily.  Dispense: 30 tablet; Refill: 5 - carvedilol (COREG) 12.5 MG tablet; Take 1 tablet (12.5 mg total) by mouth 2 (two) times daily with a meal.  Dispense: 60 tablet; Refill: 5  5. Morbid obesity ([place]) Discussed cutting back on portion sizes. Increase physical activity- exercise 30 mins 5x/week   Meds ordered this encounter  Medications  . pantoprazole (PROTONIX) 40 MG tablet    Sig: Take 1 tablet (40 mg total) by mouth daily.    Dispense:  30 tablet    Refill:  5  . lisinopril (PRINIVIL,ZESTRIL) 40 MG tablet    Sig: Take 1 tablet (40 mg total) by mouth daily.    Dispense:  30 tablet    Refill:  5  . DISCONTD: glipiZIDE (GLUCOTROL) 5 MG tablet    Sig: Take 1 tablet (5 mg total) by mouth 2 (two) times daily before a meal.    Dispense:  60 tablet    Refill:  5  . cloNIDine (CATAPRES) 0.3 MG tablet  Sig: Take 1 tablet (0.3 mg total) by mouth 2 (two) times daily.    Dispense:  60 tablet    Refill:  5  . amLODipine (NORVASC) 10 MG tablet    Sig: Take 1 tablet (10 mg total) by mouth daily.    Dispense:  30 tablet    Refill:  5  . carvedilol (COREG) 12.5 MG tablet    Sig: Take 1 tablet (12.5 mg total) by mouth 2 (two) times daily with a meal.    Dispense:  60 tablet    Refill:  5    Discontinue metoprolol  . glipiZIDE (GLUCOTROL) 10 MG tablet    Sig: Take 1 tablet (10 mg total) by mouth 2 (two) times daily before a meal.    Dispense:  60 tablet    Refill:  5    Discontinue previous dose    Follow-up: Return in about 3 months (around 02/28/2017) for Follow-up diabetes mellitus.   Arnoldo Morale MD

## 2016-11-30 ENCOUNTER — Telehealth: Payer: Self-pay

## 2016-11-30 LAB — CMP14+EGFR
ALT: 31 IU/L (ref 0–44)
AST: 20 IU/L (ref 0–40)
Albumin/Globulin Ratio: 1.3 (ref 1.2–2.2)
Albumin: 4.2 g/dL (ref 3.5–5.5)
Alkaline Phosphatase: 57 IU/L (ref 39–117)
BUN/Creatinine Ratio: 13 (ref 9–20)
BUN: 15 mg/dL (ref 6–24)
Bilirubin Total: 0.3 mg/dL (ref 0.0–1.2)
CALCIUM: 9.6 mg/dL (ref 8.7–10.2)
CO2: 24 mmol/L (ref 18–29)
Chloride: 99 mmol/L (ref 96–106)
Creatinine, Ser: 1.16 mg/dL (ref 0.76–1.27)
GFR, EST AFRICAN AMERICAN: 79 mL/min/{1.73_m2} (ref >59)
GFR, EST NON AFRICAN AMERICAN: 69 mL/min/{1.73_m2} (ref >59)
GLUCOSE: 173 mg/dL — AB (ref 65–99)
Globulin, Total: 3.3 g/dL (ref 1.5–4.5)
POTASSIUM: 4.4 mmol/L (ref 3.5–5.2)
Sodium: 138 mmol/L (ref 134–144)
Total Protein: 7.5 g/dL (ref 6.0–8.5)

## 2016-11-30 NOTE — Telephone Encounter (Signed)
Writer called patient with lab results. Patient stated understanding. 

## 2016-11-30 NOTE — Telephone Encounter (Signed)
-----   Message from Jaclyn Shaggy, MD sent at 11/30/2016  9:05 AM EDT ----- Labs revealed normal kidney and liver function however glucose is elevated. Comply with new dose of glipizide, diabetic diet and weight loss.

## 2017-02-26 ENCOUNTER — Ambulatory Visit: Payer: Medicare Other | Attending: Family Medicine | Admitting: Family Medicine

## 2017-02-26 ENCOUNTER — Encounter: Payer: Self-pay | Admitting: Family Medicine

## 2017-02-26 VITALS — BP 145/94 | HR 69 | Temp 98.5°F | Resp 20 | Ht 75.0 in | Wt 328.2 lb

## 2017-02-26 DIAGNOSIS — Z114 Encounter for screening for human immunodeficiency virus [HIV]: Secondary | ICD-10-CM | POA: Diagnosis not present

## 2017-02-26 DIAGNOSIS — I10 Essential (primary) hypertension: Secondary | ICD-10-CM | POA: Diagnosis not present

## 2017-02-26 DIAGNOSIS — Z7982 Long term (current) use of aspirin: Secondary | ICD-10-CM | POA: Insufficient documentation

## 2017-02-26 DIAGNOSIS — Z794 Long term (current) use of insulin: Secondary | ICD-10-CM | POA: Diagnosis not present

## 2017-02-26 DIAGNOSIS — Z91013 Allergy to seafood: Secondary | ICD-10-CM | POA: Insufficient documentation

## 2017-02-26 DIAGNOSIS — Z9889 Other specified postprocedural states: Secondary | ICD-10-CM | POA: Diagnosis not present

## 2017-02-26 DIAGNOSIS — Z13818 Encounter for screening for other digestive system disorders: Secondary | ICD-10-CM | POA: Insufficient documentation

## 2017-02-26 DIAGNOSIS — Z885 Allergy status to narcotic agent status: Secondary | ICD-10-CM | POA: Diagnosis not present

## 2017-02-26 DIAGNOSIS — Z6841 Body Mass Index (BMI) 40.0 and over, adult: Secondary | ICD-10-CM | POA: Diagnosis not present

## 2017-02-26 DIAGNOSIS — Z1159 Encounter for screening for other viral diseases: Secondary | ICD-10-CM | POA: Diagnosis not present

## 2017-02-26 DIAGNOSIS — E119 Type 2 diabetes mellitus without complications: Secondary | ICD-10-CM | POA: Insufficient documentation

## 2017-02-26 LAB — POCT GLYCOSYLATED HEMOGLOBIN (HGB A1C): Hemoglobin A1C: 7.9

## 2017-02-26 LAB — GLUCOSE, POCT (MANUAL RESULT ENTRY): POC GLUCOSE: 144 mg/dL — AB (ref 70–99)

## 2017-02-26 MED ORDER — ATORVASTATIN CALCIUM 40 MG PO TABS: 40.0000 mg | ORAL_TABLET | Freq: Every day | ORAL | 5 refills | Status: DC

## 2017-02-26 MED ORDER — GLIPIZIDE 10 MG PO TABS: 10.0000 mg | ORAL_TABLET | Freq: Two times a day (BID) | ORAL | 5 refills | Status: DC

## 2017-02-26 MED ORDER — AMLODIPINE BESYLATE 10 MG PO TABS: 10.0000 mg | ORAL_TABLET | Freq: Every day | ORAL | 5 refills | Status: DC

## 2017-02-26 MED ORDER — CLONIDINE HCL 0.3 MG PO TABS: 0.3000 mg | ORAL_TABLET | Freq: Two times a day (BID) | ORAL | 5 refills | Status: DC

## 2017-02-26 MED ORDER — CARVEDILOL 12.5 MG PO TABS: 12.5000 mg | ORAL_TABLET | Freq: Two times a day (BID) | ORAL | 5 refills | Status: DC

## 2017-02-26 MED ORDER — LISINOPRIL 40 MG PO TABS: 40.0000 mg | ORAL_TABLET | Freq: Every day | ORAL | 5 refills | Status: DC

## 2017-02-26 NOTE — Progress Notes (Signed)
Subjective:  Patient ID: Anthony Barnes, male    DOB: 26-Sep-1955  Age: 61 y.o. MRN: [id number]  CC: Follow-up visit  HPI Anthony Barnes is a 61 year old male with a history of hypertension, type 2 diabetes mellitus A1c 7.9 which is down from 8.2 previously, morbid obesity who comes into the clinic for follow-up visit.  He has been compliant with his medications but has not been exercising. Has not been checking his blood sugar log but denies hypoglycemic episodes, Visual complaints or numbness in extremities. Adhering to a diabetic diet has been a challenge for him as he loves sweets. He is not up-to-date on an annual eye exam.  His blood pressure is elevated and he endorses not taking his lisinopril today but took his other antihypertensives. Denies side effects from medications.  Denies shortness of breath, chest pains and has no acute concerns today.  Past Medical History:  Diagnosis Date  . Diabetes mellitus without complication (HCC)   . Hypertension     Past Surgical History:  Procedure Laterality Date  . ESOPHAGOGASTRODUODENOSCOPY N/A 03/22/2013   Procedure: ESOPHAGOGASTRODUODENOSCOPY (EGD);  Surgeon: Graylin Shiver, MD;  Location: [facility] ENDOSCOPY;  Service: Endoscopy;  Laterality: N/A;    Allergies  Allergen Reactions  . Shellfish Allergy Anaphylaxis  . Codeine Itching    unknown     Outpatient Medications Prior to Visit  Medication Sig Dispense Refill  . aspirin 325 MG tablet Take 1 tablet (325 mg total) by mouth daily. 30 tablet 3  . Multiple Vitamin (MULTIVITAMIN WITH MINERALS) TABS tablet Take 1 tablet by mouth daily.    Marland Kitchen amLODipine (NORVASC) 10 MG tablet Take 1 tablet (10 mg total) by mouth daily. 30 tablet 5  . carvedilol (COREG) 12.5 MG tablet Take 1 tablet (12.5 mg total) by mouth 2 (two) times daily with a meal. 60 tablet 5  . cloNIDine (CATAPRES) 0.3 MG tablet Take 1 tablet (0.3 mg total) by mouth 2 (two) times daily. 60 tablet 5  . glipiZIDE  (GLUCOTROL) 10 MG tablet Take 1 tablet (10 mg total) by mouth 2 (two) times daily before a meal. 60 tablet 5  . lisinopril (PRINIVIL,ZESTRIL) 40 MG tablet Take 1 tablet (40 mg total) by mouth daily. 30 tablet 5  . glucose blood (ACCU-CHEK AVIVA) test strip Use daily before breakfast (Patient not taking: Reported on 11/29/2016) 100 each 5  . Lancet Devices (ACCU-CHEK SOFTCLIX) lancets Use as instructed daily. (Patient not taking: Reported on 07/21/2016) 1 each 5  . OVER THE COUNTER MEDICATION Take 3 tablets by mouth every other day. Reported on 09/09/2015    . pantoprazole (PROTONIX) 40 MG tablet Take 1 tablet (40 mg total) by mouth daily. (Patient not taking: Reported on 02/26/2017) 30 tablet 5   No facility-administered medications prior to visit.     ROS Review of Systems  Constitutional: Negative for activity change and appetite change.  HENT: Negative for sinus pressure and sore throat.   Eyes: Negative for visual disturbance.  Respiratory: Negative for cough, chest tightness and shortness of breath.   Cardiovascular: Negative for chest pain and leg swelling.  Gastrointestinal: Negative for abdominal distention, abdominal pain, constipation and diarrhea.  Endocrine: Negative.   Genitourinary: Negative for dysuria.  Musculoskeletal: Negative for joint swelling and myalgias.  Skin: Negative for rash.  Allergic/Immunologic: Negative.   Neurological: Negative for weakness, light-headedness and numbness.  Psychiatric/Behavioral: Negative for dysphoric mood and suicidal ideas.    Objective:  BP (!) 164/101 (BP Location: Left Arm,  Patient Position: Sitting, Cuff Size: Large)   Pulse 69   Temp 98.5 F (36.9 C) (Oral)   Resp 20   Ht 6\' 3"  (1.905 m)   Wt (!) 328 lb 3.2 oz (148.9 kg)   SpO2 96%   BMI 41.02 kg/m   BP/Weight 02/26/2017 11/29/2016 07/21/2016  Systolic BP 164 157 144  Diastolic BP 101 92 91  Wt. (Lbs) 328.2 332.4 328.2  BMI 41.02 40.46 39.95      Physical  Exam Constitutional: He is morbidly obese, oriented to person, place, and time. He appears well-developed and well-nourished.  Cardiovascular: Normal rate, normal heart sounds and intact distal pulses.   No murmur heard. Pulmonary/Chest: Effort normal and breath sounds normal. He has no wheezes. He has no rales. He exhibits no tenderness.  Abdominal: Soft. Bowel sounds are normal. He exhibits no distension and no mass. There is no tenderness.  Musculoskeletal: Normal range of motion.  Neurological: He is alert and oriented to person, place, and time.  Skin: Skin is warm and dry.  Psychiatric: He has a normal mood and affect.   Assessment & Plan:   1. Type 2 diabetes mellitus without complication, with long-term current use of insulin (HCC) Not at goal with A1c of 7.9 We'll consider Victoza at his next visit as this will help with weight loss (he is not excited about injectables) Keep blood sugar logs with fasting goals of 80-120 mg/dl, random of less than 914 and in the event of sugars less than 60 mg/dl or greater than 782 mg/dl please notify the clinic ASAP. It is recommended that you undergo annual eye exams and annual foot exams. Pneumovax is recommended every 5 years before the age of 50 and once for a lifetime at or after the age of 42.  - Glucose (CBG) - HgB A1c - Ambulatory referral to Ophthalmology - glipiZIDE (GLUCOTROL) 10 MG tablet; Take 1 tablet (10 mg total) by mouth 2 (two) times daily before a meal.  Dispense: 60 tablet; Refill: 5 - atorvastatin (LIPITOR) 40 MG tablet; Take 1 tablet (40 mg total) by mouth daily.  Dispense: 30 tablet; Refill: 5 - Basic metabolic panel  2. Essential hypertension Uncontrolled due to not taking lisinopril today Compliance has been emphasized Low-sodium diet Lifestyle modification including exercise and weight loss - cloNIDine (CATAPRES) 0.3 MG tablet; Take 1 tablet (0.3 mg total) by mouth 2 (two) times daily.  Dispense: 60 tablet; Refill:  5 - amLODipine (NORVASC) 10 MG tablet; Take 1 tablet (10 mg total) by mouth daily.  Dispense: 30 tablet; Refill: 5 - carvedilol (COREG) 12.5 MG tablet; Take 1 tablet (12.5 mg total) by mouth 2 (two) times daily with a meal.  Dispense: 60 tablet; Refill: 5 - lisinopril (PRINIVIL,ZESTRIL) 40 MG tablet; Take 1 tablet (40 mg total) by mouth daily.  Dispense: 30 tablet; Refill: 5  3. Morbid obesity (HCC) Discussed cutting back on portion sizes Exercise 30 minutes on at least 5 days of the week  4. Screening for hepatitis C and HIV -Hepatitis C and HIV antibody sent off  Meds ordered this encounter  Medications  . cloNIDine (CATAPRES) 0.3 MG tablet    Sig: Take 1 tablet (0.3 mg total) by mouth 2 (two) times daily.    Dispense:  60 tablet    Refill:  5  . glipiZIDE (GLUCOTROL) 10 MG tablet    Sig: Take 1 tablet (10 mg total) by mouth 2 (two) times daily before a meal.    Dispense:  60 tablet    Refill:  5    Discontinue previous dose  . amLODipine (NORVASC) 10 MG tablet    Sig: Take 1 tablet (10 mg total) by mouth daily.    Dispense:  30 tablet    Refill:  5  . carvedilol (COREG) 12.5 MG tablet    Sig: Take 1 tablet (12.5 mg total) by mouth 2 (two) times daily with a meal.    Dispense:  60 tablet    Refill:  5  . lisinopril (PRINIVIL,ZESTRIL) 40 MG tablet    Sig: Take 1 tablet (40 mg total) by mouth daily.    Dispense:  30 tablet    Refill:  5  . atorvastatin (LIPITOR) 40 MG tablet    Sig: Take 1 tablet (40 mg total) by mouth daily.    Dispense:  30 tablet    Refill:  5    Follow-up: Return in about 3 months (around 05/29/2017) for Follow-up on diabetes mellitus.   This note has been created with Education officer, environmentalDragon speech recognition software and smart phrase technology. Any transcriptional errors are unintentional.     Jaclyn ShaggyEnobong Amao MD

## 2017-02-26 NOTE — Patient Instructions (Signed)

## 2017-02-26 NOTE — Progress Notes (Signed)
Follow up.

## 2017-02-27 ENCOUNTER — Encounter: Payer: Self-pay | Admitting: Family Medicine

## 2017-02-27 LAB — BASIC METABOLIC PANEL
BUN/Creatinine Ratio: 14 (ref 9–20)
BUN: 13 mg/dL (ref 6–24)
CHLORIDE: 102 mmol/L (ref 96–106)
CO2: 19 mmol/L — ABNORMAL LOW (ref 20–29)
Calcium: 9.5 mg/dL (ref 8.7–10.2)
Creatinine, Ser: 0.91 mg/dL (ref 0.76–1.27)
GFR calc non Af Amer: 92 mL/min/{1.73_m2} (ref >59)
GFR, EST AFRICAN AMERICAN: 106 mL/min/{1.73_m2} (ref >59)
Glucose: 100 mg/dL — ABNORMAL HIGH (ref 65–99)
Potassium: 3.9 mmol/L (ref 3.5–5.2)
Sodium: 140 mmol/L (ref 134–144)

## 2017-02-27 LAB — HCV COMMENT:

## 2017-02-27 LAB — HIV ANTIBODY (ROUTINE TESTING W REFLEX): HIV Screen 4th Generation wRfx: NONREACTIVE

## 2017-02-27 LAB — HEPATITIS C ANTIBODY (REFLEX): HCV Ab: <0.1 s/co ratio (ref 0.0–0.9)

## 2017-05-30 ENCOUNTER — Ambulatory Visit: Payer: Medicare Other | Admitting: Family Medicine

## 2017-06-25 ENCOUNTER — Encounter: Payer: Self-pay | Admitting: Family Medicine

## 2017-06-25 ENCOUNTER — Ambulatory Visit: Payer: Medicare Other | Attending: Family Medicine | Admitting: Family Medicine

## 2017-06-25 VITALS — BP 180/100 | HR 75 | Temp 97.7°F | Ht 75.0 in | Wt 329.0 lb

## 2017-06-25 DIAGNOSIS — Z2882 Immunization not carried out because of caregiver refusal: Secondary | ICD-10-CM | POA: Diagnosis not present

## 2017-06-25 DIAGNOSIS — Z794 Long term (current) use of insulin: Secondary | ICD-10-CM | POA: Diagnosis not present

## 2017-06-25 DIAGNOSIS — Z0000 Encounter for general adult medical examination without abnormal findings: Secondary | ICD-10-CM | POA: Diagnosis not present

## 2017-06-25 DIAGNOSIS — Z7982 Long term (current) use of aspirin: Secondary | ICD-10-CM | POA: Insufficient documentation

## 2017-06-25 DIAGNOSIS — K219 Gastro-esophageal reflux disease without esophagitis: Secondary | ICD-10-CM | POA: Insufficient documentation

## 2017-06-25 DIAGNOSIS — E119 Type 2 diabetes mellitus without complications: Secondary | ICD-10-CM | POA: Diagnosis not present

## 2017-06-25 DIAGNOSIS — Z79899 Other long term (current) drug therapy: Secondary | ICD-10-CM | POA: Diagnosis not present

## 2017-06-25 DIAGNOSIS — Z6841 Body Mass Index (BMI) 40.0 and over, adult: Secondary | ICD-10-CM | POA: Diagnosis not present

## 2017-06-25 DIAGNOSIS — Z91013 Allergy to seafood: Secondary | ICD-10-CM | POA: Insufficient documentation

## 2017-06-25 DIAGNOSIS — Z9119 Patient's noncompliance with other medical treatment and regimen: Secondary | ICD-10-CM | POA: Diagnosis not present

## 2017-06-25 DIAGNOSIS — I10 Essential (primary) hypertension: Secondary | ICD-10-CM | POA: Diagnosis not present

## 2017-06-25 LAB — GLUCOSE, POCT (MANUAL RESULT ENTRY): POC Glucose: 155 mg/dl — AB (ref 70–99)

## 2017-06-25 LAB — POCT GLYCOSYLATED HEMOGLOBIN (HGB A1C): HEMOGLOBIN A1C: 7.4

## 2017-06-25 MED ORDER — AMLODIPINE BESYLATE 10 MG PO TABS: 10.0000 mg | ORAL_TABLET | Freq: Every day | ORAL | 1 refills | Status: DC

## 2017-06-25 MED ORDER — CARVEDILOL 25 MG PO TABS: 25.0000 mg | ORAL_TABLET | Freq: Two times a day (BID) | ORAL | 1 refills | Status: DC

## 2017-06-25 MED ORDER — CLONIDINE HCL 0.3 MG PO TABS: 0.3000 mg | ORAL_TABLET | Freq: Two times a day (BID) | ORAL | 1 refills | Status: DC

## 2017-06-25 MED ORDER — GLIPIZIDE 10 MG PO TABS: 10.0000 mg | ORAL_TABLET | Freq: Two times a day (BID) | ORAL | 1 refills | Status: DC

## 2017-06-25 MED ORDER — PANTOPRAZOLE SODIUM 40 MG PO TBEC: 40.0000 mg | DELAYED_RELEASE_TABLET | Freq: Every day | ORAL | 1 refills | Status: DC

## 2017-06-25 MED ORDER — LISINOPRIL 40 MG PO TABS: 40.0000 mg | ORAL_TABLET | Freq: Every day | ORAL | 1 refills | Status: DC

## 2017-06-25 MED ORDER — ATORVASTATIN CALCIUM 40 MG PO TABS: 40.0000 mg | ORAL_TABLET | Freq: Every day | ORAL | 1 refills | Status: DC

## 2017-06-25 NOTE — Progress Notes (Signed)
Subjective:  Patient ID: Anthony Barnes, male    DOB: [birth year]  Age: 61 y.o. MRN: [id number]  CC: Diabetes   HPI Anthony Barnes is a 61 year old male with a history of hypertension, type 2 diabetes mellitus A1c 7.4, morbid obesity who comes into the clinic for follow-up visit.  His diabetes reveals significant improvement in A1c from 7.9 previously and is down at 7.4 now.  He states he has cut back on a lot of sweets and has been compliant with his medications. Denies visual complaints, numbness in extremities. He does have atorvastatin on his med list however he has not been taking this and informs me he has been working on a low-cholesterol diet.  His blood pressure is significantly elevated despite compliance with his antihypertensives which he endorses taking today.  He complains with a low-sodium diet but does not exercise regularly. Denies any adverse effects from his medications.  Reflux symptoms are stable and he denies abdominal pain, diarrhea or constipation. He has no chest pains or shortness of breath.  Past Medical History:  Diagnosis Date  . Diabetes mellitus without complication ([place])   . Hypertension     History reviewed. No pertinent surgical history.  Allergies  Allergen Reactions  . Shellfish Allergy Anaphylaxis  . Codeine Itching    unknown     Outpatient Medications Prior to Visit  Medication Sig Dispense Refill  . aspirin 325 MG tablet Take 1 tablet (325 mg total) by mouth daily. 30 tablet 3  . Multiple Vitamin (MULTIVITAMIN WITH MINERALS) TABS tablet Take 1 tablet by mouth daily.    Marland Kitchen OVER THE COUNTER MEDICATION Take 3 tablets by mouth every other day. Reported on 09/09/2015    . amLODipine (NORVASC) 10 MG tablet Take 1 tablet (10 mg total) by mouth daily. 30 tablet 5  . carvedilol (COREG) 12.5 MG tablet Take 1 tablet (12.5 mg total) by mouth 2 (two) times daily with a meal. 60 tablet 5  . cloNIDine (CATAPRES) 0.3 MG tablet Take 1 tablet (0.3  mg total) by mouth 2 (two) times daily. 60 tablet 5  . glipiZIDE (GLUCOTROL) 10 MG tablet Take 1 tablet (10 mg total) by mouth 2 (two) times daily before a meal. 60 tablet 5  . lisinopril (PRINIVIL,ZESTRIL) 40 MG tablet Take 1 tablet (40 mg total) by mouth daily. 30 tablet 5  . glucose blood (ACCU-CHEK AVIVA) test strip Use daily before breakfast (Patient not taking: Reported on 11/29/2016) 100 each 5  . Lancet Devices (ACCU-CHEK SOFTCLIX) lancets Use as instructed daily. (Patient not taking: Reported on 07/21/2016) 1 each 5  . atorvastatin (LIPITOR) 40 MG tablet Take 1 tablet (40 mg total) by mouth daily. (Patient not taking: Reported on 06/25/2017) 30 tablet 5  . pantoprazole (PROTONIX) 40 MG tablet Take 1 tablet (40 mg total) by mouth daily. (Patient not taking: Reported on 02/26/2017) 30 tablet 5   No facility-administered medications prior to visit.     ROS Review of Systems  Constitutional: Negative for activity change and appetite change.  HENT: Negative for sinus pressure and sore throat.   Eyes: Negative for visual disturbance.  Respiratory: Negative for cough, chest tightness and shortness of breath.   Cardiovascular: Negative for chest pain and leg swelling.  Gastrointestinal: Negative for abdominal distention, abdominal pain, constipation and diarrhea.  Endocrine: Negative.   Genitourinary: Negative for dysuria.  Musculoskeletal: Negative for joint swelling and myalgias.  Skin: Negative for rash.  Allergic/Immunologic: Negative.   Neurological: Negative for weakness,  light-headedness and numbness.  Psychiatric/Behavioral: Negative for dysphoric mood and suicidal ideas.    Objective:  BP (!) 180/100   Pulse 75   Temp 97.7 F (36.5 C) (Oral)   Ht 6' 3"  (1.905 m)   Wt (!) 329 lb (149.2 kg)   SpO2 97%   BMI 41.12 kg/m   BP/Weight 06/25/2017 02/26/2017 5/88/5027  Systolic BP 741 287 867  Diastolic BP 672 94 92  Wt. (Lbs) 329 328.2 332.4  BMI 41.12 41.02 40.46       Physical Exam  Constitutional: He is oriented to person, place, and time. He appears well-developed and well-nourished.  Cardiovascular: Normal rate, normal heart sounds and intact distal pulses.  No murmur heard. Pulmonary/Chest: Effort normal and breath sounds normal. He has no wheezes. He has no rales. He exhibits no tenderness.  Abdominal: Soft. Bowel sounds are normal. He exhibits no distension and no mass. There is no tenderness.  Musculoskeletal: Normal range of motion.  Neurological: He is alert and oriented to person, place, and time.  Skin: Skin is warm and dry.  Psychiatric: He has a normal mood and affect.    Lab Results  Component Value Date   HGBA1C 7.4 06/25/2017     CMP Latest Ref Rng & Units 02/26/2017 11/29/2016 07/21/2016  Glucose 65 - 99 mg/dL 100(H) 173(H) 173(H)  BUN 6 - 24 mg/dL 13 15 13   Creatinine 0.76 - 1.27 mg/dL 0.91 1.16 1.11  Sodium 134 - 144 mmol/L 140 138 136  Potassium 3.5 - 5.2 mmol/L 3.9 4.4 4.6  Chloride 96 - 106 mmol/L 102 99 102  CO2 20 - 29 mmol/L 19(L) 24 25  Calcium 8.7 - 10.2 mg/dL 9.5 9.6 8.9  Total Protein 6.0 - 8.5 g/dL - 7.5 7.3  Total Bilirubin 0.0 - 1.2 mg/dL - 0.3 0.3  Alkaline Phos 39 - 117 IU/L - 57 48  AST 0 - 40 IU/L - 20 20  ALT 0 - 44 IU/L - 31 28     Lipid Panel     Component Value Date/Time   CHOL 123 07/21/2016 1051   TRIG 50 07/21/2016 1051   HDL 39 (L) 07/21/2016 1051   CHOLHDL 3.2 07/21/2016 1051   VLDL 10 07/21/2016 1051   LDLCALC 74 07/21/2016 1051    Assessment & Plan:   1. Type 2 diabetes mellitus without complication, with long-term current use of insulin (HCC) Controlled with A1c of 7.4 which is improved from 7.9 previously Commended on improvement Continue diabetic diet, lifestyle modifications He has not been compliant with atorvastatin for primary prevention of cardiovascular disease and advised to be more compliant - POCT glucose (manual entry) - POCT glycosylated hemoglobin (Hb A1C) -  atorvastatin (LIPITOR) 40 MG tablet; Take 1 tablet (40 mg total) daily by mouth.  Dispense: 90 tablet; Refill: 1 - glipiZIDE (GLUCOTROL) 10 MG tablet; Take 1 tablet (10 mg total) 2 (two) times daily before a meal by mouth.  Dispense: 180 tablet; Refill: 1 - CMP14+EGFR; Future - Lipid panel; Future  2. Essential hypertension Uncontrolled Increase dose of carvedilol Low sodium, DASH diet - amLODipine (NORVASC) 10 MG tablet; Take 1 tablet (10 mg total) daily by mouth.  Dispense: 90 tablet; Refill: 1 - carvedilol (COREG) 25 MG tablet; Take 1 tablet (25 mg total) 2 (two) times daily with a meal by mouth.  Dispense: 180 tablet; Refill: 1 - cloNIDine (CATAPRES) 0.3 MG tablet; Take 1 tablet (0.3 mg total) 2 (two) times daily by mouth.  Dispense: 180  tablet; Refill: 1 - lisinopril (PRINIVIL,ZESTRIL) 40 MG tablet; Take 1 tablet (40 mg total) daily by mouth.  Dispense: 90 tablet; Refill: 1  3. Gastroesophageal reflux disease without esophagitis Stable - pantoprazole (PROTONIX) 40 MG tablet; Take 1 tablet (40 mg total) daily by mouth.  Dispense: 90 tablet; Refill: 1  4.  Healthcare maintenance Declines colonoscopy Declines flu shot  Meds ordered this encounter  Medications  . amLODipine (NORVASC) 10 MG tablet    Sig: Take 1 tablet (10 mg total) daily by mouth.    Dispense:  90 tablet    Refill:  1  . atorvastatin (LIPITOR) 40 MG tablet    Sig: Take 1 tablet (40 mg total) daily by mouth.    Dispense:  90 tablet    Refill:  1  . carvedilol (COREG) 25 MG tablet    Sig: Take 1 tablet (25 mg total) 2 (two) times daily with a meal by mouth.    Dispense:  180 tablet    Refill:  1  . cloNIDine (CATAPRES) 0.3 MG tablet    Sig: Take 1 tablet (0.3 mg total) 2 (two) times daily by mouth.    Dispense:  180 tablet    Refill:  1  . glipiZIDE (GLUCOTROL) 10 MG tablet    Sig: Take 1 tablet (10 mg total) 2 (two) times daily before a meal by mouth.    Dispense:  180 tablet    Refill:  1    Discontinue  previous dose  . lisinopril (PRINIVIL,ZESTRIL) 40 MG tablet    Sig: Take 1 tablet (40 mg total) daily by mouth.    Dispense:  90 tablet    Refill:  1  . pantoprazole (PROTONIX) 40 MG tablet    Sig: Take 1 tablet (40 mg total) daily by mouth.    Dispense:  90 tablet    Refill:  1    Follow-up: Return in about 1 month (around 07/25/2017) for Follow-up on hypertension.   Arnoldo Morale MD

## 2017-06-25 NOTE — Patient Instructions (Signed)

## 2017-06-26 ENCOUNTER — Encounter: Payer: Self-pay | Admitting: Family Medicine

## 2017-07-02 ENCOUNTER — Ambulatory Visit: Payer: Medicare Other | Attending: Family Medicine

## 2017-07-02 DIAGNOSIS — Z794 Long term (current) use of insulin: Secondary | ICD-10-CM | POA: Diagnosis not present

## 2017-07-02 DIAGNOSIS — E119 Type 2 diabetes mellitus without complications: Secondary | ICD-10-CM

## 2017-07-02 NOTE — Progress Notes (Signed)
Patient here for lab visit only 

## 2017-07-03 ENCOUNTER — Telehealth: Payer: Self-pay

## 2017-07-03 LAB — CMP14+EGFR
A/G RATIO: 1.2 (ref 1.2–2.2)
ALT: 29 IU/L (ref 0–44)
AST: 20 IU/L (ref 0–40)
Albumin: 4.3 g/dL (ref 3.5–5.5)
Alkaline Phosphatase: 55 IU/L (ref 39–117)
BILIRUBIN TOTAL: 0.3 mg/dL (ref 0.0–1.2)
BUN/Creatinine Ratio: 13 (ref 9–20)
BUN: 11 mg/dL (ref 6–24)
CALCIUM: 9.5 mg/dL (ref 8.7–10.2)
CHLORIDE: 102 mmol/L (ref 96–106)
CO2: 24 mmol/L (ref 20–29)
Creatinine, Ser: 0.83 mg/dL (ref 0.76–1.27)
GFR calc Af Amer: 111 mL/min/{1.73_m2} (ref >59)
GFR calc non Af Amer: 96 mL/min/{1.73_m2} (ref >59)
Globulin, Total: 3.5 g/dL (ref 1.5–4.5)
Glucose: 104 mg/dL — ABNORMAL HIGH (ref 65–99)
POTASSIUM: 4.3 mmol/L (ref 3.5–5.2)
SODIUM: 140 mmol/L (ref 134–144)
Total Protein: 7.8 g/dL (ref 6.0–8.5)

## 2017-07-03 LAB — LIPID PANEL
CHOL/HDL RATIO: 3.3 ratio (ref 0.0–5.0)
Cholesterol, Total: 156 mg/dL (ref 100–199)
HDL: 48 mg/dL (ref >39)
LDL CALC: 95 mg/dL (ref 0–99)
TRIGLYCERIDES: 66 mg/dL (ref 0–149)
VLDL CHOLESTEROL CAL: 13 mg/dL (ref 5–40)

## 2017-07-03 NOTE — Telephone Encounter (Signed)
Pt was called and informed of lab results. 

## 2017-08-03 ENCOUNTER — Ambulatory Visit: Payer: Medicare Other | Admitting: Family Medicine

## 2017-08-20 ENCOUNTER — Ambulatory Visit: Payer: Medicare Other | Attending: Family Medicine | Admitting: Family Medicine

## 2017-08-20 ENCOUNTER — Encounter: Payer: Self-pay | Admitting: Family Medicine

## 2017-08-20 VITALS — BP 165/90 | HR 66 | Temp 98.0°F | Ht 75.0 in | Wt 330.8 lb

## 2017-08-20 DIAGNOSIS — Z79899 Other long term (current) drug therapy: Secondary | ICD-10-CM | POA: Insufficient documentation

## 2017-08-20 DIAGNOSIS — N529 Male erectile dysfunction, unspecified: Secondary | ICD-10-CM | POA: Insufficient documentation

## 2017-08-20 DIAGNOSIS — I10 Essential (primary) hypertension: Secondary | ICD-10-CM | POA: Diagnosis not present

## 2017-08-20 DIAGNOSIS — Z7982 Long term (current) use of aspirin: Secondary | ICD-10-CM | POA: Diagnosis not present

## 2017-08-20 DIAGNOSIS — Z794 Long term (current) use of insulin: Secondary | ICD-10-CM | POA: Insufficient documentation

## 2017-08-20 DIAGNOSIS — E119 Type 2 diabetes mellitus without complications: Secondary | ICD-10-CM | POA: Insufficient documentation

## 2017-08-20 LAB — GLUCOSE, POCT (MANUAL RESULT ENTRY): POC Glucose: 188 mg/dl — AB (ref 70–99)

## 2017-08-20 MED ORDER — ISOSORBIDE MONONITRATE ER 60 MG PO TB24: 60.0000 mg | ORAL_TABLET | Freq: Every day | ORAL | 1 refills | Status: DC

## 2017-08-20 MED ORDER — CARVEDILOL 12.5 MG PO TABS: 12.5000 mg | ORAL_TABLET | Freq: Two times a day (BID) | ORAL | 1 refills | Status: DC

## 2017-08-20 NOTE — Progress Notes (Signed)
Subjective:  Patient ID: DYLAN MONFORTE, male    DOB: 1955/10/05  Age: 62 y.o. MRN: [id number]  CC: Diabetes   HPI Anthony Barnes  is a 62 year old male with a history of hypertension, type 2 diabetes mellitus A1c 7.4, morbid obesity who comes into the clinic for follow-up of Hypertension.  At his last visit his dose of Coreg had been increased from 12.5 to 25mg  with some improvement in his blood pressure however he complains of erectile dysfunction with the new dose. He has been working on a low sodium diet but does not exercise much.  He has no additional concerns today.   Past Medical History:  Diagnosis Date  . Diabetes mellitus without complication (HCC)   . Hypertension     Past Surgical History:  Procedure Laterality Date  . ESOPHAGOGASTRODUODENOSCOPY N/A 03/22/2013   Procedure: ESOPHAGOGASTRODUODENOSCOPY (EGD);  Surgeon: Graylin Shiver, MD;  Location: [facility] ENDOSCOPY;  Service: Endoscopy;  Laterality: N/A;    Allergies  Allergen Reactions  . Shellfish Allergy Anaphylaxis  . Codeine Itching    unknown     Outpatient Medications Prior to Visit  Medication Sig Dispense Refill  . amLODipine (NORVASC) 10 MG tablet Take 1 tablet (10 mg total) daily by mouth. 90 tablet 1  . aspirin 325 MG tablet Take 1 tablet (325 mg total) by mouth daily. 30 tablet 3  . atorvastatin (LIPITOR) 40 MG tablet Take 1 tablet (40 mg total) daily by mouth. 90 tablet 1  . cloNIDine (CATAPRES) 0.3 MG tablet Take 1 tablet (0.3 mg total) 2 (two) times daily by mouth. 180 tablet 1  . glipiZIDE (GLUCOTROL) 10 MG tablet Take 1 tablet (10 mg total) 2 (two) times daily before a meal by mouth. 180 tablet 1  . lisinopril (PRINIVIL,ZESTRIL) 40 MG tablet Take 1 tablet (40 mg total) daily by mouth. 90 tablet 1  . Multiple Vitamin (MULTIVITAMIN WITH MINERALS) TABS tablet Take 1 tablet by mouth daily.    . carvedilol (COREG) 25 MG tablet Take 1 tablet (25 mg total) 2 (two) times daily with a meal by mouth.  180 tablet 1  . glucose blood (ACCU-CHEK AVIVA) test strip Use daily before breakfast (Patient not taking: Reported on 11/29/2016) 100 each 5  . Lancet Devices (ACCU-CHEK SOFTCLIX) lancets Use as instructed daily. (Patient not taking: Reported on 07/21/2016) 1 each 5  . OVER THE COUNTER MEDICATION Take 3 tablets by mouth every other day. Reported on 09/09/2015    . pantoprazole (PROTONIX) 40 MG tablet Take 1 tablet (40 mg total) daily by mouth. (Patient not taking: Reported on 08/20/2017) 90 tablet 1   No facility-administered medications prior to visit.     ROS Review of Systems  Constitutional: Negative for activity change and appetite change.  HENT: Negative for sinus pressure and sore throat.   Eyes: Negative for visual disturbance.  Respiratory: Negative for cough, chest tightness and shortness of breath.   Cardiovascular: Negative for chest pain and leg swelling.  Gastrointestinal: Negative for abdominal distention, abdominal pain, constipation and diarrhea.  Endocrine: Negative.   Genitourinary: Negative for dysuria.  Musculoskeletal: Negative for joint swelling and myalgias.  Skin: Negative for rash.  Allergic/Immunologic: Negative.   Neurological: Negative for weakness, light-headedness and numbness.  Psychiatric/Behavioral: Negative for dysphoric mood and suicidal ideas.    Objective:  BP (!) 165/90   Pulse 66   Temp 98 F (36.7 C) (Oral)   Ht 6\' 3"  (1.905 m)   Wt (!) 330 lb 12.8  oz (150 kg)   SpO2 99%   BMI 41.35 kg/m   BP/Weight 08/20/2017 06/25/2017 02/26/2017  Systolic BP 165 180 145  Diastolic BP 90 100 94  Wt. (Lbs) 330.8 329 328.2  BMI 41.35 41.12 41.02      Physical Exam  Constitutional: He is oriented to person, place, and time. He appears well-developed and well-nourished.  Cardiovascular: Normal rate, normal heart sounds and intact distal pulses.  No murmur heard. Pulmonary/Chest: Effort normal and breath sounds normal. He has no wheezes. He has no  rales. He exhibits no tenderness.  Abdominal: Soft. Bowel sounds are normal. He exhibits no distension and no mass. There is no tenderness.  Musculoskeletal: Normal range of motion.  Neurological: He is alert and oriented to person, place, and time.  Skin: Skin is warm and dry.  Psychiatric: He has a normal mood and affect.     Lab Results  Component Value Date   HGBA1C 7.4 06/25/2017    Assessment & Plan:   1. Type 2 diabetes mellitus without complication, with long-term current use of insulin (HCC) Controlled with A1c of 7.4 Continue current management Diabetic diet - POCT glucose (manual entry)  2. Essential hypertension Uncontrolled Reduce dose of Coreg due to complaints of ED Add Isosorbide to regimen- side effect profile discussed Low sodium, DASH diet, weight loss. - carvedilol (COREG) 12.5 MG tablet; Take 1 tablet (12.5 mg total) by mouth 2 (two) times daily with a meal.  Dispense: 90 tablet; Refill: 1 - isosorbide mononitrate (IMDUR) 60 MG 24 hr tablet; Take 1 tablet (60 mg total) by mouth daily.  Dispense: 90 tablet; Refill: 1   Meds ordered this encounter  Medications  . carvedilol (COREG) 12.5 MG tablet    Sig: Take 1 tablet (12.5 mg total) by mouth 2 (two) times daily with a meal.    Dispense:  90 tablet    Refill:  1    Discontinue previous dose  . isosorbide mononitrate (IMDUR) 60 MG 24 hr tablet    Sig: Take 1 tablet (60 mg total) by mouth daily.    Dispense:  90 tablet    Refill:  1    Follow-up: Return in about 3 months (around 11/18/2017) for Follow-up of chronic medical conditions.   Jaclyn ShaggyEnobong Amao MD

## 2017-08-21 ENCOUNTER — Encounter: Payer: Self-pay | Admitting: Family Medicine

## 2017-11-25 ENCOUNTER — Emergency Department (HOSPITAL_COMMUNITY): Payer: Medicare Other

## 2017-11-25 ENCOUNTER — Other Ambulatory Visit: Payer: Self-pay

## 2017-11-25 ENCOUNTER — Emergency Department (HOSPITAL_COMMUNITY)
Admission: EM | Admit: 2017-11-25 | Discharge: 2017-11-25 | Disposition: A | Discharge status: Home / Self Care | Payer: Medicare Other | Attending: Emergency Medicine | Admitting: Emergency Medicine

## 2017-11-25 ENCOUNTER — Encounter (HOSPITAL_COMMUNITY): Payer: Self-pay | Admitting: Emergency Medicine

## 2017-11-25 DIAGNOSIS — Z7982 Long term (current) use of aspirin: Secondary | ICD-10-CM | POA: Insufficient documentation

## 2017-11-25 DIAGNOSIS — X503XXA Overexertion from repetitive movements, initial encounter: Secondary | ICD-10-CM | POA: Diagnosis not present

## 2017-11-25 DIAGNOSIS — I10 Essential (primary) hypertension: Secondary | ICD-10-CM | POA: Diagnosis not present

## 2017-11-25 DIAGNOSIS — E119 Type 2 diabetes mellitus without complications: Secondary | ICD-10-CM | POA: Insufficient documentation

## 2017-11-25 DIAGNOSIS — R0789 Other chest pain: Secondary | ICD-10-CM | POA: Diagnosis not present

## 2017-11-25 DIAGNOSIS — Z87891 Personal history of nicotine dependence: Secondary | ICD-10-CM | POA: Diagnosis not present

## 2017-11-25 DIAGNOSIS — Z7984 Long term (current) use of oral hypoglycemic drugs: Secondary | ICD-10-CM | POA: Insufficient documentation

## 2017-11-25 DIAGNOSIS — Z79899 Other long term (current) drug therapy: Secondary | ICD-10-CM | POA: Diagnosis not present

## 2017-11-25 DIAGNOSIS — R079 Chest pain, unspecified: Secondary | ICD-10-CM | POA: Diagnosis not present

## 2017-11-25 LAB — CBC
HCT: 39.1 % (ref 39.0–52.0)
Hemoglobin: 13.5 g/dL (ref 13.0–17.0)
MCH: 28 pg (ref 26.0–34.0)
MCHC: 34.5 g/dL (ref 30.0–36.0)
MCV: 81.1 fL (ref 78.0–100.0)
PLATELETS: 211 10*3/uL (ref 150–400)
RBC: 4.82 MIL/uL (ref 4.22–5.81)
RDW: 15.4 % (ref 11.5–15.5)
WBC: 8.1 10*3/uL (ref 4.0–10.5)

## 2017-11-25 LAB — BASIC METABOLIC PANEL
Anion gap: 10 (ref 5–15)
BUN: 7 mg/dL (ref 6–20)
CALCIUM: 9.1 mg/dL (ref 8.9–10.3)
CO2: 22 mmol/L (ref 22–32)
CREATININE: 0.87 mg/dL (ref 0.61–1.24)
Chloride: 102 mmol/L (ref 101–111)
GFR calc Af Amer: >60 mL/min (ref >60)
GLUCOSE: 153 mg/dL — AB (ref 65–99)
Potassium: 3.9 mmol/L (ref 3.5–5.1)
Sodium: 134 mmol/L — ABNORMAL LOW (ref 135–145)

## 2017-11-25 LAB — D-DIMER, QUANTITATIVE: D-Dimer, Quant: 0.37 ug/mL-FEU (ref 0.00–0.50)

## 2017-11-25 LAB — I-STAT TROPONIN, ED
TROPONIN I, POC: 0.01 ng/mL (ref 0.00–0.08)
Troponin i, poc: 0.01 ng/mL (ref 0.00–0.08)

## 2017-11-25 MED ORDER — IBUPROFEN 800 MG PO TABS: 800.0000 mg | ORAL_TABLET | Freq: Three times a day (TID) | ORAL | 0 refills | Status: DC | PRN

## 2017-11-25 MED ORDER — ASPIRIN 81 MG PO CHEW
324.0000 mg | CHEWABLE_TABLET | Freq: Once | ORAL | Status: AC
Start: 2017-11-25 — End: 2017-11-25
  Administered 2017-11-25: 324 mg via ORAL
  Filled 2017-11-25: qty 4

## 2017-11-25 MED ORDER — TRAMADOL HCL 50 MG PO TABS: 50.0000 mg | ORAL_TABLET | Freq: Four times a day (QID) | ORAL | 0 refills | Status: DC | PRN

## 2017-11-25 MED ORDER — HYDROCODONE-ACETAMINOPHEN 5-325 MG PO TABS
1.0000 | ORAL_TABLET | Freq: Once | ORAL | Status: AC
  Administered 2017-11-25: 1 via ORAL
  Filled 2017-11-25: qty 1

## 2017-11-25 NOTE — ED Triage Notes (Signed)
Pt states chest pain started Friday.  Worse yesterday than today.  Left-sided.  Radiates to back.  Worse with movement and lying on his side.  Described as sharp aching pain. No N/V.  Recent illness 2 weeks ago that he thought was the flu.

## 2017-11-25 NOTE — ED Provider Notes (Signed)
[facility] EMERGENCY DEPARTMENT Provider Note   CSN: [id number] Arrival date & time: 11/25/17  0353     History   Chief Complaint Chief Complaint  Patient presents with  . Chest Pain    HPI Anthony Barnes is a 62 y.o. male.  HPI Patient presents to the emergency department with chest pain that started Friday morning when he woke up.  The patient states that is been ongoing since that time.  He states that certain movements and palpation of certain areas makes the pain worse.  He states that he did a large amount of yard work over the previous 2 days prior to the pain.  He states that he feels like that is what led to this chest discomfort.  Patient states that he has had no shortness of breath or diaphoresis.  Patient states that he did not take any medications prior to arrival for his symptoms.  The patient denies  shortness of breath, headache,blurred vision, neck pain, fever, cough, weakness, numbness, dizziness, anorexia, edema, abdominal pain, nausea, vomiting, diarrhea, rash, back pain, dysuria, hematemesis, bloody stool, near syncope, or syncope. Past Medical History:  Diagnosis Date  . Diabetes mellitus without complication (HCC)   . Hypertension     Patient Active Problem List   Diagnosis Date Noted  . Morbid obesity (HCC) 03/13/2016  . Dental caries 09/09/2015  . Diabetes (HCC) 10/20/2013  . DM (diabetes mellitus) type 2, uncontrolled, with ketoacidosis (HCC) 04/01/2013  . Essential hypertension, benign 04/01/2013  . GERD (gastroesophageal reflux disease) 04/01/2013  . Erosive gastritis 03/22/2013  . DM2 (diabetes mellitus, type 2) (HCC) 03/21/2013  . Unspecified essential hypertension 03/21/2013  . Hyponatremia 03/21/2013  . Epigastric pain 03/20/2013  . HTN (hypertension) 03/20/2013    Past Surgical History:  Procedure Laterality Date  . ESOPHAGOGASTRODUODENOSCOPY N/A 03/22/2013   Procedure: ESOPHAGOGASTRODUODENOSCOPY (EGD);  Surgeon:  Graylin Shiver, MD;  Location: [facility] ENDOSCOPY;  Service: Endoscopy;  Laterality: N/A;        Home Medications    Prior to Admission medications   Medication Sig Start Date End Date Taking? Authorizing Provider  amLODipine (NORVASC) 10 MG tablet Take 1 tablet (10 mg total) daily by mouth. 06/25/17  Yes Newlin, Enobong, MD  aspirin 325 MG tablet Take 1 tablet (325 mg total) by mouth daily. 04/01/13  Yes Quentin Angst, MD  carvedilol (COREG) 12.5 MG tablet Take 1 tablet (12.5 mg total) by mouth 2 (two) times daily with a meal. Patient taking differently: Take 25 mg by mouth every morning.  08/20/17  Yes Hoy Register, MD  cloNIDine (CATAPRES) 0.3 MG tablet Take 1 tablet (0.3 mg total) 2 (two) times daily by mouth. 06/25/17  Yes Newlin, Enobong, MD  glipiZIDE (GLUCOTROL) 10 MG tablet Take 1 tablet (10 mg total) 2 (two) times daily before a meal by mouth. 06/25/17  Yes Newlin, Enobong, MD  lisinopril (PRINIVIL,ZESTRIL) 40 MG tablet Take 1 tablet (40 mg total) daily by mouth. 06/25/17  Yes Newlin, Enobong, MD  Multiple Vitamin (MULTIVITAMIN WITH MINERALS) TABS tablet Take 1 tablet by mouth daily.   Yes [provider]  omega-3 acid ethyl esters (LOVAZA) 1 g capsule Take 1 g by mouth 2 (two) times daily.   Yes [provider]  POTASSIUM PO Take 1-2 tablets by mouth daily.   Yes [provider]  isosorbide mononitrate (IMDUR) 60 MG 24 hr tablet Take 1 tablet (60 mg total) by mouth daily. Patient not taking: Reported on 11/25/2017 08/20/17  Hoy RegisterNewlin, Enobong, MD    Family History Family History  Problem Relation Age of Onset  . Hypertension Mother   . Cancer Mother 2258       ovarian  . Diabetes Father   . Stroke Father     Social History Social History   Tobacco Use  . Smoking status: Former Smoker    Packs/day: 1.00    Years: 16.00    Pack years: 16.00    Types: Cigarettes    Start date: 08/14/1974    Last attempt to quit: 08/14/1990    Years since quitting:  27.3  . Smokeless tobacco: Former NeurosurgeonUser    Quit date: 08/14/1990  Substance Use Topics  . Alcohol use: Yes    Alcohol/week: 2.4 oz    Types: 2 Cans of beer, 2 Shots of liquor per week    Comment: occas  . Drug use: No     Allergies   Shellfish allergy and Codeine   Review of Systems Review of Systems All other systems negative except as documented in the HPI. All pertinent positives and negatives as reviewed in the HPI.  Physical Exam Updated Vital Signs BP (!) 172/108   Pulse 97   Temp 98.5 F (36.9 C) (Oral)   Resp (!) 21   SpO2 96%   Physical Exam  Constitutional: He is oriented to person, place, and time. He appears well-developed and well-nourished. No distress.  HENT:  Head: Normocephalic and atraumatic.  Mouth/Throat: Oropharynx is clear and moist.  Eyes: Pupils are equal, round, and reactive to light.  Neck: Normal range of motion. Neck supple.  Cardiovascular: Normal rate, regular rhythm and normal heart sounds. Exam reveals no gallop and no friction rub.  No murmur heard. Pulmonary/Chest: Effort normal and breath sounds normal. No accessory muscle usage or stridor. No tachypnea. No respiratory distress. He has no decreased breath sounds. He has no wheezes. He has no rhonchi. He has no rales. He exhibits tenderness. He exhibits no crepitus, no deformity, no swelling and no retraction.    Abdominal: Soft. Bowel sounds are normal. He exhibits no distension. There is no tenderness.  Neurological: He is alert and oriented to person, place, and time. He exhibits normal muscle tone. Coordination normal.  Skin: Skin is warm and dry. Capillary refill takes less than 2 seconds. No rash noted. No erythema.  Psychiatric: He has a normal mood and affect. His behavior is normal.  Nursing note and vitals reviewed.    ED Treatments / Results  Labs (all labs ordered are listed, but only abnormal results are displayed) Labs Reviewed  BASIC METABOLIC PANEL - Abnormal; Notable  for the following components:      Result Value   Sodium 134 (*)    Glucose, Bld 153 (*)    All other components within normal limits  CBC  D-DIMER, QUANTITATIVE (NOT AT [facility])  I-STAT TROPONIN, ED  I-STAT TROPONIN, ED    EKG EKG Interpretation  Date/Time:  Sunday November 25 2017 04:01:18 EDT Ventricular Rate:  77 PR Interval:  164 QRS Duration: 92 QT Interval:  400 QTC Calculation: 452 R Axis:   -2 Text Interpretation:  Normal sinus rhythm Moderate voltage criteria for LVH, may be normal variant Borderline ECG No significant change since last tracing Confirmed by Melene PlanFloyd, Dan [phone]) on 11/25/2017 5:32:42 AM Also confirmed by Melene PlanFloyd, Dan [phone]), editor Barbette Hairassel, Kerry [phone])  on 11/25/2017 7:50:46 AM   Radiology Dg Chest 2 View  Result Date: 11/25/2017 CLINICAL DATA:  Left-sided  chest pain for 2 days. EXAM: CHEST - 2 VIEW COMPARISON:  03/20/2013 FINDINGS: The cardiomediastinal contours are normal. The lungs are clear. Pulmonary vasculature is normal. No consolidation, pleural effusion, or pneumothorax. No acute osseous abnormalities are seen. Degenerative change in the spine. IMPRESSION: No acute findings. Electronically Signed   By: Rubye Oaks M.D.   On: 11/25/2017 05:02    Procedures Procedures (including critical care time)  Medications Ordered in ED Medications  aspirin chewable tablet 324 mg (324 mg Oral Given 11/25/17 0723)  HYDROcodone-acetaminophen (NORCO/VICODIN) 5-325 MG per tablet 1 tablet (1 tablet Oral Given 11/25/17 0723)     Initial Impression / Assessment and Plan / ED Course  I have reviewed the triage vital signs and the nursing notes.  Pertinent labs & imaging results that were available during my care of the patient were reviewed by me and considered in my medical decision making (see chart for details).  Clinical Course as of Nov 26 930  Sun Nov 25, 2017  0647 BP(!): 179/145 [LB]    Clinical Course User Index [LB] Luana Shu     I do feel that the patient is most likely having chest wall pain based off his HPI and physical exam findings along with the fact that he had 2 sets of negative troponins along with a negative d-dimer.  Patient has not been tachypneic or tachycardic here in the emergency department that showed no signs of hypoxia.  The patient does have multiple cardiac risk factors however do feel this pain is atypical for cardiac chest pain.  I did advise him that we cannot totally exclude cardiological issue that is causing this pain however we do have good reassurance based off of our testing that there is no immediate cardiac issues.  I did advise him that he will need to see his primary care doctor in close follow-up.  I did advise him that there is any changes or worsening in his condition he will need to return here for reevaluation.  Patient agrees the plan and all questions were answered.  Patient has had elevated blood pressures but this seems to be a chronic issue for the patient based off of previous visits to his primary office along with review of notes from here.  I advised him that he will need close follow-up for that as well to try to get his blood pressure under better control.  I do not feel he is having a hypertensive crisis or emergency at this time.  Final Clinical Impressions(s) / ED Diagnoses   Final diagnoses:  None    ED Discharge Orders    None       Charlestine Night, PA-C 11/25/17 4782    Long, Arlyss Repress, MD 11/25/17 [phone]

## 2017-11-25 NOTE — ED Notes (Signed)
Pt stable, ambulatory, and verbalizes understanding of d/c instructions.  

## 2017-11-25 NOTE — ED Notes (Signed)
ED Provider at bedside. 

## 2017-11-25 NOTE — Discharge Instructions (Addendum)
Return here for any worsening in your condition.  Follow-up closely with your primary care doctor for reevaluation and further management of your blood pressure.  The testing here today did not show any significant abnormality.  If there is any worsening in your condition please return for reevaluation.

## 2018-01-15 ENCOUNTER — Encounter: Payer: Self-pay | Admitting: Family Medicine

## 2018-01-15 ENCOUNTER — Ambulatory Visit (HOSPITAL_COMMUNITY)
Admission: RE | Admit: 2018-01-15 | Discharge: 2018-01-15 | Disposition: A | Discharge status: Home / Self Care | Payer: Medicare Other | Source: Ambulatory Visit | Attending: Family Medicine | Admitting: Family Medicine

## 2018-01-15 ENCOUNTER — Ambulatory Visit: Payer: Medicare Other | Attending: Family Medicine | Admitting: Family Medicine

## 2018-01-15 ENCOUNTER — Telehealth: Payer: Self-pay | Admitting: Family Medicine

## 2018-01-15 VITALS — BP 154/85 | HR 61 | Temp 97.5°F | Ht 75.0 in | Wt 328.4 lb

## 2018-01-15 DIAGNOSIS — Z885 Allergy status to narcotic agent status: Secondary | ICD-10-CM | POA: Insufficient documentation

## 2018-01-15 DIAGNOSIS — Z79891 Long term (current) use of opiate analgesic: Secondary | ICD-10-CM | POA: Insufficient documentation

## 2018-01-15 DIAGNOSIS — Z792 Long term (current) use of antibiotics: Secondary | ICD-10-CM | POA: Diagnosis not present

## 2018-01-15 DIAGNOSIS — Z91013 Allergy to seafood: Secondary | ICD-10-CM | POA: Insufficient documentation

## 2018-01-15 DIAGNOSIS — Z7982 Long term (current) use of aspirin: Secondary | ICD-10-CM | POA: Insufficient documentation

## 2018-01-15 DIAGNOSIS — E11621 Type 2 diabetes mellitus with foot ulcer: Secondary | ICD-10-CM | POA: Diagnosis not present

## 2018-01-15 DIAGNOSIS — L97509 Non-pressure chronic ulcer of other part of unspecified foot with unspecified severity: Secondary | ICD-10-CM

## 2018-01-15 DIAGNOSIS — I10 Essential (primary) hypertension: Secondary | ICD-10-CM | POA: Insufficient documentation

## 2018-01-15 DIAGNOSIS — L97911 Non-pressure chronic ulcer of unspecified part of right lower leg limited to breakdown of skin: Secondary | ICD-10-CM | POA: Insufficient documentation

## 2018-01-15 DIAGNOSIS — Z794 Long term (current) use of insulin: Secondary | ICD-10-CM | POA: Diagnosis not present

## 2018-01-15 DIAGNOSIS — L97919 Non-pressure chronic ulcer of unspecified part of right lower leg with unspecified severity: Secondary | ICD-10-CM | POA: Insufficient documentation

## 2018-01-15 DIAGNOSIS — E119 Type 2 diabetes mellitus without complications: Secondary | ICD-10-CM | POA: Insufficient documentation

## 2018-01-15 DIAGNOSIS — M79661 Pain in right lower leg: Secondary | ICD-10-CM | POA: Diagnosis not present

## 2018-01-15 DIAGNOSIS — Z79899 Other long term (current) drug therapy: Secondary | ICD-10-CM | POA: Insufficient documentation

## 2018-01-15 DIAGNOSIS — Z9889 Other specified postprocedural states: Secondary | ICD-10-CM | POA: Insufficient documentation

## 2018-01-15 DIAGNOSIS — Z7984 Long term (current) use of oral hypoglycemic drugs: Secondary | ICD-10-CM | POA: Insufficient documentation

## 2018-01-15 DIAGNOSIS — E11622 Type 2 diabetes mellitus with other skin ulcer: Secondary | ICD-10-CM | POA: Diagnosis not present

## 2018-01-15 DIAGNOSIS — Z6841 Body Mass Index (BMI) 40.0 and over, adult: Secondary | ICD-10-CM | POA: Diagnosis not present

## 2018-01-15 LAB — GLUCOSE, POCT (MANUAL RESULT ENTRY): POC GLUCOSE: 163 mg/dL — AB (ref 70–99)

## 2018-01-15 LAB — POCT GLYCOSYLATED HEMOGLOBIN (HGB A1C): HbA1c, POC (controlled diabetic range): 8.2 % — AB (ref 0.0–7.0)

## 2018-01-15 MED ORDER — GLIPIZIDE 10 MG PO TABS: 10.0000 mg | ORAL_TABLET | Freq: Two times a day (BID) | ORAL | 1 refills | Status: DC

## 2018-01-15 MED ORDER — CLONIDINE HCL 0.3 MG PO TABS: 0.3000 mg | ORAL_TABLET | Freq: Two times a day (BID) | ORAL | 1 refills | Status: DC

## 2018-01-15 MED ORDER — CEPHALEXIN 500 MG PO CAPS: 500.0000 mg | ORAL_CAPSULE | Freq: Two times a day (BID) | ORAL | 0 refills | Status: DC

## 2018-01-15 MED ORDER — GLUCOSE BLOOD VI STRP: ORAL_STRIP | 12 refills | Status: DC

## 2018-01-15 MED ORDER — ISOSORBIDE MONONITRATE ER 60 MG PO TB24: 60.0000 mg | ORAL_TABLET | Freq: Every day | ORAL | 1 refills | Status: DC

## 2018-01-15 MED ORDER — LISINOPRIL 40 MG PO TABS: 40.0000 mg | ORAL_TABLET | Freq: Every day | ORAL | 1 refills | Status: DC

## 2018-01-15 MED ORDER — ACCU-CHEK AVIVA DEVI: 0 refills | Status: DC

## 2018-01-15 MED ORDER — AMLODIPINE BESYLATE 10 MG PO TABS: 10.0000 mg | ORAL_TABLET | Freq: Every day | ORAL | 1 refills | Status: DC

## 2018-01-15 MED ORDER — ACCU-CHEK MULTICLIX LANCETS MISC: 12 refills | Status: DC

## 2018-01-15 MED ORDER — IBUPROFEN 800 MG PO TABS: 800.0000 mg | ORAL_TABLET | Freq: Two times a day (BID) | ORAL | 0 refills | Status: DC | PRN

## 2018-01-15 MED ORDER — ATORVASTATIN CALCIUM 40 MG PO TABS: 40.0000 mg | ORAL_TABLET | Freq: Every day | ORAL | 1 refills | Status: DC

## 2018-01-15 MED ORDER — CARVEDILOL 12.5 MG PO TABS: 12.5000 mg | ORAL_TABLET | Freq: Two times a day (BID) | ORAL | 1 refills | Status: DC

## 2018-01-15 NOTE — Progress Notes (Signed)
Subjective:  Patient ID: Anthony Barnes, male    DOB: [birth year]  Age: 62 y.o. MRN: [id number]  CC: Diabetes   HPI [name] is a 62 year old male with a history of hypertension, type 2 diabetes mellitus A1c 8.2, morbid obesity who comes into the clinic for follow-up of Hypertension. His A1c is 8.2 which has trended up from 7.4 and he endorses compliance with his medications but he does not exercise regularly.  He also is not very compliant with a diabetic diet as he eats whatever his wife prepares and he states she does her groceries based on coupons.  Denies neuropathy, visual concerns or hypoglycemia. His blood pressure is elevated and had commenced isosorbide at his last office visit which he never picked up. He sustained a sore in his right leg ulcer while mowing the grass and had a rock hit his leg.  He took some leftover antibiotic pills but does not recall the name and ever since has felt shooting pains around the area of the sore but no recent drainage from the sore or leg swelling or fever.  Here fact states the size of the ulcer has decreased.  Past Medical History:  Diagnosis Date  . Diabetes mellitus without complication (HCC)   . Hypertension     Past Surgical History:  Procedure Laterality Date  . ESOPHAGOGASTRODUODENOSCOPY N/A 03/22/2013   Procedure: ESOPHAGOGASTRODUODENOSCOPY (EGD);  Surgeon: Graylin Shiver, MD;  Location[facility] ENDOSCOPY;  Service: Endoscopy;  Laterality: N/A;    Allergies  Allergen Reactions  . Shellfish Allergy Anaphylaxis  . Codeine Itching    unknown     Outpatient Medications Prior to Visit  Medication Sig Dispense Refill  . aspirin 325 MG tablet Take 1 tablet (325 mg total) by mouth daily. 30 tablet 3  . Multiple Vitamin (MULTIVITAMIN WITH MINERALS) TABS tablet Take 1 tablet by mouth daily.    Marland Kitchen omega-3 acid ethyl esters (LOVAZA) 1 g capsule Take 1 g by mouth 2 (two) times daily.    Marland Kitchen POTASSIUM PO Take 1-2 tablets by mouth daily.      Marland Kitchen amLODipine (NORVASC) 10 MG tablet Take 1 tablet (10 mg total) daily by mouth. 90 tablet 1  . carvedilol (COREG) 12.5 MG tablet Take 1 tablet (12.5 mg total) by mouth 2 (two) times daily with a meal. (Patient taking differently: Take 25 mg by mouth every morning. ) 90 tablet 1  . cloNIDine (CATAPRES) 0.3 MG tablet Take 1 tablet (0.3 mg total) 2 (two) times daily by mouth. 180 tablet 1  . glipiZIDE (GLUCOTROL) 10 MG tablet Take 1 tablet (10 mg total) 2 (two) times daily before a meal by mouth. 180 tablet 1  . ibuprofen (ADVIL,MOTRIN) 800 MG tablet Take 1 tablet (800 mg total) by mouth every 8 (eight) hours as needed. 12 tablet 0  . lisinopril (PRINIVIL,ZESTRIL) 40 MG tablet Take 1 tablet (40 mg total) daily by mouth. 90 tablet 1  . traMADol (ULTRAM) 50 MG tablet Take 1 tablet (50 mg total) by mouth every 6 (six) hours as needed for severe pain. 15 tablet 0  . isosorbide mononitrate (IMDUR) 60 MG 24 hr tablet Take 1 tablet (60 mg total) by mouth daily. (Patient not taking: Reported on 11/25/2017) 90 tablet 1   No facility-administered medications prior to visit.     ROS Review of Systems  Constitutional: Negative for activity change and appetite change.  HENT: Negative for sinus pressure and sore throat.   Eyes: Negative for visual  disturbance.  Respiratory: Negative for cough, chest tightness and shortness of breath.   Cardiovascular: Negative for chest pain and leg swelling.  Gastrointestinal: Negative for abdominal distention, abdominal pain, constipation and diarrhea.  Endocrine: Negative.   Genitourinary: Negative for dysuria.  Musculoskeletal: Negative for joint swelling and myalgias.  Skin:       See hpi  Allergic/Immunologic: Negative.   Neurological: Negative for weakness, light-headedness and numbness.  Psychiatric/Behavioral: Negative for dysphoric mood and suicidal ideas.    Objective:  BP (!) 154/85   Pulse 61   Temp (!) 97.5 F (36.4 C) (Oral)   Ht 6\' 3"  (1.905 m)    Wt (!) 328 lb 6.4 oz (149 kg)   SpO2 98%   BMI 41.05 kg/m   BP/Weight 01/15/2018 11/25/2017 08/20/2017  Systolic BP 154 179 165  Diastolic BP 85 119 90  Wt. (Lbs) 328.4 - 330.8  BMI 41.05 - 41.35      Physical Exam  Constitutional: He is oriented to person, place, and time. He appears well-developed and well-nourished.  Cardiovascular: Normal rate, normal heart sounds and intact distal pulses.  No murmur heard. Pulmonary/Chest: Effort normal and breath sounds normal. He has no wheezes. He has no rales. He exhibits no tenderness.  Abdominal: Soft. Bowel sounds are normal. He exhibits no distension and no mass. There is no tenderness.  Musculoskeletal: Normal range of motion.  Neurological: He is alert and oriented to person, place, and time.  Skin: Skin is warm and dry.  Right shin with ulcer and scab over it, no surrounding erythema but slight surrounding tenderness  Psychiatric: He has a normal mood and affect.    Lab Results  Component Value Date   HGBA1C 8.2 (A) 01/15/2018    Assessment & Plan:   1. Type 2 diabetes mellitus without complication, with long-term current use of insulin (HCC) Uncontrolled with A1c of 8.2 He is resisting regimen changes at this time and so we will try stringent lifestyle modifications and if uncontrolled at next visit, his regimen will be changed. Clinical pharmacist called in for diabetic teaching - POCT glucose (manual entry) - POCT glycosylated hemoglobin (Hb A1C) - glipiZIDE (GLUCOTROL) 10 MG tablet; Take 1 tablet (10 mg total) by mouth 2 (two) times daily before a meal.  Dispense: 180 tablet; Refill: 1 - Blood Glucose Monitoring Suppl (ACCU-CHEK AVIVA) device; Use as instructed 3 times daily before meals.  Dispense: 1 each; Refill: 0 - glucose blood (ACCU-CHEK AVIVA) test strip; Use 3 times daily before meals  Dispense: 100 each; Refill: 12 - Lancets (ACCU-CHEK MULTICLIX) lancets; Use as instructed 3 times daily before meals  Dispense: 100  each; Refill: 12 - atorvastatin (LIPITOR) 40 MG tablet; Take 1 tablet (40 mg total) by mouth daily.  Dispense: 90 tablet; Refill: 1  2. Essential hypertension Uncontrolled He never started isosorbide which I have advised him to  comments Low-sodium, DASH diet - carvedilol (COREG) 12.5 MG tablet; Take 1 tablet (12.5 mg total) by mouth 2 (two) times daily with a meal.  Dispense: 90 tablet; Refill: 1 - isosorbide mononitrate (IMDUR) 60 MG 24 hr tablet; Take 1 tablet (60 mg total) by mouth daily.  Dispense: 90 tablet; Refill: 1 - lisinopril (PRINIVIL,ZESTRIL) 40 MG tablet; Take 1 tablet (40 mg total) by mouth daily.  Dispense: 90 tablet; Refill: 1 - cloNIDine (CATAPRES) 0.3 MG tablet; Take 1 tablet (0.3 mg total) by mouth 2 (two) times daily.  Dispense: 180 tablet; Refill: 1 - amLODipine (NORVASC) 10 MG tablet;  Take 1 tablet (10 mg total) by mouth daily.  Dispense: 90 tablet; Refill: 1  3. Ulcer of right lower extremity, limited to breakdown of skin (HCC) He is high risk due to premorbid conditions X-ray to rule out osteomyelitis Commenced on antibiotics - DG Tibia/Fibula Right; Future - cephALEXin (KEFLEX) 500 MG capsule; Take 1 capsule (500 mg total) by mouth 2 (two) times daily.  Dispense: 20 capsule; Refill: 0 - ibuprofen (ADVIL,MOTRIN) 800 MG tablet; Take 1 tablet (800 mg total) by mouth every 12 (twelve) hours as needed.  Dispense: 30 tablet; Refill: 0  4. Morbid obesity (HCC) Discussed weight loss, increasing physical activity   Meds ordered this encounter  Medications  . carvedilol (COREG) 12.5 MG tablet    Sig: Take 1 tablet (12.5 mg total) by mouth 2 (two) times daily with a meal.    Dispense:  90 tablet    Refill:  1    Discontinue previous dose  . isosorbide mononitrate (IMDUR) 60 MG 24 hr tablet    Sig: Take 1 tablet (60 mg total) by mouth daily.    Dispense:  90 tablet    Refill:  1  . lisinopril (PRINIVIL,ZESTRIL) 40 MG tablet    Sig: Take 1 tablet (40 mg total) by  mouth daily.    Dispense:  90 tablet    Refill:  1  . glipiZIDE (GLUCOTROL) 10 MG tablet    Sig: Take 1 tablet (10 mg total) by mouth 2 (two) times daily before a meal.    Dispense:  180 tablet    Refill:  1    Discontinue previous dose  . cloNIDine (CATAPRES) 0.3 MG tablet    Sig: Take 1 tablet (0.3 mg total) by mouth 2 (two) times daily.    Dispense:  180 tablet    Refill:  1  . amLODipine (NORVASC) 10 MG tablet    Sig: Take 1 tablet (10 mg total) by mouth daily.    Dispense:  90 tablet    Refill:  1  . cephALEXin (KEFLEX) 500 MG capsule    Sig: Take 1 capsule (500 mg total) by mouth 2 (two) times daily.    Dispense:  20 capsule    Refill:  0  . Blood Glucose Monitoring Suppl (ACCU-CHEK AVIVA) device    Sig: Use as instructed 3 times daily before meals.    Dispense:  1 each    Refill:  0    Okay to substitute as per insurance guidelines  . glucose blood (ACCU-CHEK AVIVA) test strip    Sig: Use 3 times daily before meals    Dispense:  100 each    Refill:  12  . Lancets (ACCU-CHEK MULTICLIX) lancets    Sig: Use as instructed 3 times daily before meals    Dispense:  100 each    Refill:  12  . atorvastatin (LIPITOR) 40 MG tablet    Sig: Take 1 tablet (40 mg total) by mouth daily.    Dispense:  90 tablet    Refill:  1  . DISCONTD: ibuprofen (ADVIL,MOTRIN) 800 MG tablet    Sig: Take 1 tablet (800 mg total) by mouth every 12 (twelve) hours as needed.    Dispense:  30 tablet    Refill:  0  . ibuprofen (ADVIL,MOTRIN) 800 MG tablet    Sig: Take 1 tablet (800 mg total) by mouth every 12 (twelve) hours as needed.    Dispense:  30 tablet    Refill:  0  Follow-up: Return in about 1 month (around 02/14/2018) for Follow-up of right leg ulcer.   Hoy Register MD

## 2018-01-15 NOTE — Telephone Encounter (Signed)
Patient called stating that he would like for his test strips and meter be sent to [facility] at [address], [place], [place] [id number] Please f/u

## 2018-01-15 NOTE — Patient Instructions (Signed)

## 2018-01-15 NOTE — Progress Notes (Signed)
Patient has sore on right leg that's is giving him pain.

## 2018-01-16 ENCOUNTER — Other Ambulatory Visit: Payer: Self-pay

## 2018-01-16 ENCOUNTER — Telehealth: Payer: Self-pay

## 2018-01-16 DIAGNOSIS — Z794 Long term (current) use of insulin: Principal | ICD-10-CM

## 2018-01-16 DIAGNOSIS — E119 Type 2 diabetes mellitus without complications: Secondary | ICD-10-CM

## 2018-01-16 MED ORDER — ACCU-CHEK AVIVA DEVI
0 refills | Status: DC
Start: 2018-01-16 — End: 2019-12-18

## 2018-01-16 MED ORDER — GLUCOSE BLOOD VI STRP: ORAL_STRIP | 12 refills | Status: DC

## 2018-01-16 MED ORDER — ACCU-CHEK MULTICLIX LANCETS MISC: 12 refills | Status: DC

## 2018-01-16 NOTE — Telephone Encounter (Signed)
Done

## 2018-01-16 NOTE — Telephone Encounter (Signed)
Patient was called and informed of x-ray results via voicemail.

## 2018-01-23 ENCOUNTER — Other Ambulatory Visit: Payer: Self-pay

## 2018-01-23 NOTE — Telephone Encounter (Signed)
Pt contacted the office requesting a refill on cephALEXin (KEFLEX) 500 MG capsule. Pt states he has enough for tomorrow. I informed pt that it is an antibiotic and we usually don't refill them and it is up to the provider. Please f/u

## 2018-01-23 NOTE — Telephone Encounter (Signed)
Left message on voicemail to return call.

## 2018-01-25 NOTE — Telephone Encounter (Signed)
No he doesn't need a refill.

## 2018-01-25 NOTE — Telephone Encounter (Signed)
Patient was called and informed of doctors orders. 

## 2018-01-25 NOTE — Telephone Encounter (Signed)
Does patient need a refill?

## 2018-02-18 ENCOUNTER — Encounter: Payer: Self-pay | Admitting: Family Medicine

## 2018-02-18 ENCOUNTER — Ambulatory Visit: Payer: Medicare Other | Attending: Family Medicine | Admitting: Family Medicine

## 2018-02-18 VITALS — BP 172/99 | HR 72 | Temp 98.2°F | Ht 75.0 in | Wt 327.4 lb

## 2018-02-18 DIAGNOSIS — Z6841 Body Mass Index (BMI) 40.0 and over, adult: Secondary | ICD-10-CM | POA: Diagnosis not present

## 2018-02-18 DIAGNOSIS — E119 Type 2 diabetes mellitus without complications: Secondary | ICD-10-CM | POA: Insufficient documentation

## 2018-02-18 DIAGNOSIS — Z79899 Other long term (current) drug therapy: Secondary | ICD-10-CM | POA: Diagnosis not present

## 2018-02-18 DIAGNOSIS — Z885 Allergy status to narcotic agent status: Secondary | ICD-10-CM | POA: Diagnosis not present

## 2018-02-18 DIAGNOSIS — I10 Essential (primary) hypertension: Secondary | ICD-10-CM | POA: Insufficient documentation

## 2018-02-18 DIAGNOSIS — Z794 Long term (current) use of insulin: Secondary | ICD-10-CM

## 2018-02-18 DIAGNOSIS — L97912 Non-pressure chronic ulcer of unspecified part of right lower leg with fat layer exposed: Secondary | ICD-10-CM | POA: Diagnosis not present

## 2018-02-18 DIAGNOSIS — Z7984 Long term (current) use of oral hypoglycemic drugs: Secondary | ICD-10-CM | POA: Diagnosis not present

## 2018-02-18 LAB — GLUCOSE, POCT (MANUAL RESULT ENTRY): POC Glucose: 230 mg/dl — AB (ref 70–99)

## 2018-02-18 MED ORDER — TRAMADOL HCL 50 MG PO TABS: 50.0000 mg | ORAL_TABLET | Freq: Two times a day (BID) | ORAL | 0 refills | Status: DC | PRN

## 2018-02-18 NOTE — Progress Notes (Signed)
Subjective:  Patient ID: Anthony Barnes, male    DOB: [birth year]  Age: 62 y.o. MRN: [id number]  CC: Wound Check (right leg)   HPI Anthony Barnes is a 62 year old male with a history of hypertension, type 2 diabetes mellitus A1c 8.2, morbid obesity who comes into the clinic for follow-up of the right leg ulcer. He sustained this about 6 weeks ago while mowing his lawn and was placed on Keflex at his last office but he complains of shooting pains around the ulcer.  X-ray of tibia-fibula was negative for osteomyelitis. He does have some drainage from his sore which occurs whenever he takes off the Band-Aid. His blood pressure is elevated and he informs me he ran out of his lisinopril and has not been taking his isosorbide.  Past Medical History:  Diagnosis Date  . Diabetes mellitus without complication (HCC)   . Hypertension     Past Surgical History:  Procedure Laterality Date  . ESOPHAGOGASTRODUODENOSCOPY N/A 03/22/2013   Procedure: ESOPHAGOGASTRODUODENOSCOPY (EGD);  Surgeon: Graylin Shiver, MD;  Location: [facility] ENDOSCOPY;  Service: Endoscopy;  Laterality: N/A;    Allergies  Allergen Reactions  . Shellfish Allergy Anaphylaxis  . Codeine Itching    unknown     Outpatient Medications Prior to Visit  Medication Sig Dispense Refill  . amLODipine (NORVASC) 10 MG tablet Take 1 tablet (10 mg total) by mouth daily. 90 tablet 1  . atorvastatin (LIPITOR) 40 MG tablet Take 1 tablet (40 mg total) by mouth daily. 90 tablet 1  . Blood Glucose Monitoring Suppl (ACCU-CHEK AVIVA) device Use as instructed 3 times daily before meals. 1 each 0  . carvedilol (COREG) 12.5 MG tablet Take 1 tablet (12.5 mg total) by mouth 2 (two) times daily with a meal. 90 tablet 1  . cloNIDine (CATAPRES) 0.3 MG tablet Take 1 tablet (0.3 mg total) by mouth 2 (two) times daily. 180 tablet 1  . glipiZIDE (GLUCOTROL) 10 MG tablet Take 1 tablet (10 mg total) by mouth 2 (two) times daily before a meal. 180 tablet 1  .  glucose blood (ACCU-CHEK AVIVA) test strip Use 3 times daily before meals 100 each 12  . ibuprofen (ADVIL,MOTRIN) 800 MG tablet Take 1 tablet (800 mg total) by mouth every 12 (twelve) hours as needed. 30 tablet 0  . Lancets (ACCU-CHEK MULTICLIX) lancets Use as instructed 3 times daily before meals 100 each 12  . lisinopril (PRINIVIL,ZESTRIL) 40 MG tablet Take 1 tablet (40 mg total) by mouth daily. 90 tablet 1  . Multiple Vitamin (MULTIVITAMIN WITH MINERALS) TABS tablet Take 1 tablet by mouth daily.    Marland Kitchen omega-3 acid ethyl esters (LOVAZA) 1 g capsule Take 1 g by mouth 2 (two) times daily.    Marland Kitchen POTASSIUM PO Take 1-2 tablets by mouth daily.    Marland Kitchen aspirin 325 MG tablet Take 1 tablet (325 mg total) by mouth daily. (Patient not taking: Reported on 02/18/2018) 30 tablet 3  . isosorbide mononitrate (IMDUR) 60 MG 24 hr tablet Take 1 tablet (60 mg total) by mouth daily. (Patient not taking: Reported on 02/18/2018) 90 tablet 1  . cephALEXin (KEFLEX) 500 MG capsule Take 1 capsule (500 mg total) by mouth 2 (two) times daily. (Patient not taking: Reported on 02/18/2018) 20 capsule 0   No facility-administered medications prior to visit.     ROS Review of Systems  Constitutional: Negative for activity change and appetite change.  HENT: Negative for sinus pressure and sore throat.   Eyes:  Negative for visual disturbance.  Respiratory: Negative for cough, chest tightness and shortness of breath.   Cardiovascular: Negative for chest pain and leg swelling.  Gastrointestinal: Negative for abdominal distention, abdominal pain, constipation and diarrhea.  Endocrine: Negative.   Genitourinary: Negative for dysuria.  Musculoskeletal: Negative for joint swelling and myalgias.  Skin: Positive for wound. Negative for rash.  Allergic/Immunologic: Negative.   Neurological: Negative for weakness, light-headedness and numbness.  Psychiatric/Behavioral: Negative for dysphoric mood and suicidal ideas.    Objective:  BP (!)  172/99   Pulse 72   Temp 98.2 F (36.8 C) (Oral)   Ht 6\' 3"  (1.905 m)   Wt (!) 327 lb 6.4 oz (148.5 kg)   SpO2 97%   BMI 40.92 kg/m   BP/Weight 02/18/2018 01/15/2018 11/25/2017  Systolic BP 172 154 179  Diastolic BP 99 85 119  Wt. (Lbs) 327.4 328.4 -  BMI 40.92 41.05 -      Physical Exam  Constitutional: He is oriented to person, place, and time. He appears well-developed and well-nourished.  Cardiovascular: Normal rate, normal heart sounds and intact distal pulses.  No murmur heard. Pulmonary/Chest: Effort normal and breath sounds normal. He has no wheezes. He has no rales. He exhibits no tenderness.  Abdominal: Soft. Bowel sounds are normal. He exhibits no distension and no mass. There is no tenderness.  Musculoskeletal: Normal range of motion.  Neurological: He is alert and oriented to person, place, and time.  Skin:  Skin ulcer on anterior right leg with yellowish exudates, surrounding TTP   Psychiatric: He has a normal mood and affect.   EXAM: RIGHT TIBIA AND FIBULA - 2 VIEW  COMPARISON:  None.  FINDINGS: No acute fracture, subluxation or dislocation.  No radiopaque foreign bodies identified.  Soft tissues are unremarkable.  Degenerative changes in the knee are present.  IMPRESSION: 1. No acute abnormality 2. Degenerative changes within the knee.   Lab Results  Component Value Date   HGBA1C 8.2 (A) 01/15/2018    Assessment & Plan:   1. Type 2 diabetes mellitus without complication, without long-term current use of insulin (HCC) Uncontrolled with A1c of 8.2 He had resisted initiation of insulin and is working harder at his lifestyle modifications We will reassess at next visit and adjust regimen if still uncontrolled - POCT glucose (manual entry)  2. Essential hypertension, benign Uncontrolled due to running out of lisinopril and isosorbide Compliance emphasized Counseled on Diabetic diet, my plate method, 161150 minutes of moderate intensity  exercise/week Keep blood sugar logs with fasting goals of 80-120 mg/dl, random of less than 096180 and in the event of sugars less than 60 mg/dl or greater than 045400 mg/dl please notify the clinic ASAP. It is recommended that you undergo annual eye exams and annual foot exams. Pneumonia vaccine is recommended.   3. Ulcer of right lower extremity with fat layer exposed (HCC) Completed course of Keflex X-ray negative for osteomyelitis Will refer to wound care - AMB referral to wound care center   Meds ordered this encounter  Medications  . traMADol (ULTRAM) 50 MG tablet    Sig: Take 1 tablet (50 mg total) by mouth every 12 (twelve) hours as needed.    Dispense:  30 tablet    Refill:  0    Follow-up: Return in about 3 months (around 05/21/2018) for Follow-up of chronic medical conditions.   Hoy RegisterEnobong Anely Spiewak MD

## 2018-02-18 NOTE — Patient Instructions (Signed)

## 2018-02-19 ENCOUNTER — Other Ambulatory Visit: Payer: Self-pay | Admitting: *Deleted

## 2018-02-19 ENCOUNTER — Telehealth: Payer: Self-pay | Admitting: Family Medicine

## 2018-02-19 DIAGNOSIS — L97911 Non-pressure chronic ulcer of unspecified part of right lower leg limited to breakdown of skin: Secondary | ICD-10-CM

## 2018-02-19 MED ORDER — IBUPROFEN 800 MG PO TABS: 800.0000 mg | ORAL_TABLET | Freq: Two times a day (BID) | ORAL | 0 refills | Status: DC | PRN

## 2018-02-19 MED FILL — IBUPROFEN 800 MG TABLET: 800 | 15 days supply | Qty: 30 | Fill #0

## 2018-02-19 NOTE — Telephone Encounter (Signed)
Pt called stating  -traMADol (ULTRAM) 50 MG tablet  Is not treating his symptoms as expected and would like a phone call back from his nurse with alternatives, Please follow up

## 2018-02-19 NOTE — Telephone Encounter (Signed)
Patient verified DOB Patient states Tramadol does not address his pain and did not pick up the medication from [facility]. Patient requested and PCP approved [facility]ro[facility] to [facility].

## 2018-02-19 NOTE — Telephone Encounter (Signed)
Patient was called and patient states that he would like to try another medication for pain the Tramadol is not working.

## 2018-02-19 NOTE — Telephone Encounter (Signed)
He just received prescription for tramadol 2 days ago.  I would recommend increasing his frequency to every 8 hours as I am unable to add another controlled medication so soon.

## 2018-03-07 ENCOUNTER — Encounter (HOSPITAL_BASED_OUTPATIENT_CLINIC_OR_DEPARTMENT_OTHER): Payer: Medicare Other | Attending: Internal Medicine

## 2018-03-07 ENCOUNTER — Telehealth: Payer: Self-pay | Admitting: Family Medicine

## 2018-03-07 DIAGNOSIS — I87331 Chronic venous hypertension (idiopathic) with ulcer and inflammation of right lower extremity: Secondary | ICD-10-CM | POA: Insufficient documentation

## 2018-03-07 DIAGNOSIS — Z6839 Body mass index (BMI) 39.0-39.9, adult: Secondary | ICD-10-CM | POA: Insufficient documentation

## 2018-03-07 DIAGNOSIS — L97812 Non-pressure chronic ulcer of other part of right lower leg with fat layer exposed: Secondary | ICD-10-CM | POA: Insufficient documentation

## 2018-03-07 DIAGNOSIS — I890 Lymphedema, not elsewhere classified: Secondary | ICD-10-CM | POA: Insufficient documentation

## 2018-03-07 DIAGNOSIS — E1165 Type 2 diabetes mellitus with hyperglycemia: Secondary | ICD-10-CM | POA: Insufficient documentation

## 2018-03-07 DIAGNOSIS — I10 Essential (primary) hypertension: Secondary | ICD-10-CM | POA: Insufficient documentation

## 2018-03-07 NOTE — Telephone Encounter (Signed)
Patient called for pain medication stated that his wound doctor didn't see him on [id number] because they didn't have power.

## 2018-03-08 MED ORDER — TRAMADOL HCL 50 MG PO TABS: 50.0000 mg | ORAL_TABLET | Freq: Two times a day (BID) | ORAL | 0 refills | Status: DC | PRN

## 2018-03-08 NOTE — Telephone Encounter (Signed)
Refilled

## 2018-03-12 DIAGNOSIS — Z6839 Body mass index (BMI) 39.0-39.9, adult: Secondary | ICD-10-CM | POA: Diagnosis not present

## 2018-03-12 DIAGNOSIS — I10 Essential (primary) hypertension: Secondary | ICD-10-CM | POA: Diagnosis not present

## 2018-03-12 DIAGNOSIS — L97812 Non-pressure chronic ulcer of other part of right lower leg with fat layer exposed: Secondary | ICD-10-CM | POA: Diagnosis not present

## 2018-03-12 DIAGNOSIS — I87331 Chronic venous hypertension (idiopathic) with ulcer and inflammation of right lower extremity: Secondary | ICD-10-CM | POA: Diagnosis not present

## 2018-03-12 DIAGNOSIS — I890 Lymphedema, not elsewhere classified: Secondary | ICD-10-CM | POA: Diagnosis not present

## 2018-03-12 DIAGNOSIS — E11622 Type 2 diabetes mellitus with other skin ulcer: Secondary | ICD-10-CM | POA: Diagnosis not present

## 2018-03-12 DIAGNOSIS — E1165 Type 2 diabetes mellitus with hyperglycemia: Secondary | ICD-10-CM | POA: Diagnosis not present

## 2018-03-18 ENCOUNTER — Other Ambulatory Visit (HOSPITAL_BASED_OUTPATIENT_CLINIC_OR_DEPARTMENT_OTHER): Payer: Self-pay | Admitting: Internal Medicine

## 2018-03-19 ENCOUNTER — Ambulatory Visit (HOSPITAL_COMMUNITY): Admission: RE | Admit: 2018-03-19 | Payer: Medicare Other | Source: Ambulatory Visit

## 2018-03-19 ENCOUNTER — Encounter (HOSPITAL_BASED_OUTPATIENT_CLINIC_OR_DEPARTMENT_OTHER): Payer: Medicare Other | Attending: Internal Medicine

## 2018-03-19 DIAGNOSIS — E119 Type 2 diabetes mellitus without complications: Secondary | ICD-10-CM | POA: Insufficient documentation

## 2018-03-19 DIAGNOSIS — L97812 Non-pressure chronic ulcer of other part of right lower leg with fat layer exposed: Secondary | ICD-10-CM | POA: Diagnosis not present

## 2018-03-19 DIAGNOSIS — I87331 Chronic venous hypertension (idiopathic) with ulcer and inflammation of right lower extremity: Secondary | ICD-10-CM | POA: Insufficient documentation

## 2018-03-19 DIAGNOSIS — Z7984 Long term (current) use of oral hypoglycemic drugs: Secondary | ICD-10-CM | POA: Diagnosis not present

## 2018-03-19 DIAGNOSIS — Z87891 Personal history of nicotine dependence: Secondary | ICD-10-CM | POA: Insufficient documentation

## 2018-03-19 DIAGNOSIS — M79661 Pain in right lower leg: Secondary | ICD-10-CM | POA: Diagnosis not present

## 2018-03-19 DIAGNOSIS — Z6839 Body mass index (BMI) 39.0-39.9, adult: Secondary | ICD-10-CM | POA: Diagnosis not present

## 2018-03-19 DIAGNOSIS — I890 Lymphedema, not elsewhere classified: Secondary | ICD-10-CM | POA: Insufficient documentation

## 2018-03-19 DIAGNOSIS — E11622 Type 2 diabetes mellitus with other skin ulcer: Secondary | ICD-10-CM | POA: Diagnosis not present

## 2018-03-19 DIAGNOSIS — I10 Essential (primary) hypertension: Secondary | ICD-10-CM | POA: Insufficient documentation

## 2018-03-21 ENCOUNTER — Other Ambulatory Visit (HOSPITAL_BASED_OUTPATIENT_CLINIC_OR_DEPARTMENT_OTHER): Payer: Self-pay | Admitting: Internal Medicine

## 2018-03-21 DIAGNOSIS — L97901 Non-pressure chronic ulcer of unspecified part of unspecified lower leg limited to breakdown of skin: Secondary | ICD-10-CM

## 2018-03-25 DIAGNOSIS — E119 Type 2 diabetes mellitus without complications: Secondary | ICD-10-CM | POA: Diagnosis not present

## 2018-03-25 DIAGNOSIS — L97812 Non-pressure chronic ulcer of other part of right lower leg with fat layer exposed: Secondary | ICD-10-CM | POA: Diagnosis not present

## 2018-03-25 DIAGNOSIS — I10 Essential (primary) hypertension: Secondary | ICD-10-CM | POA: Diagnosis not present

## 2018-03-25 DIAGNOSIS — I87331 Chronic venous hypertension (idiopathic) with ulcer and inflammation of right lower extremity: Secondary | ICD-10-CM | POA: Diagnosis not present

## 2018-03-25 DIAGNOSIS — E11622 Type 2 diabetes mellitus with other skin ulcer: Secondary | ICD-10-CM | POA: Diagnosis not present

## 2018-03-25 DIAGNOSIS — I890 Lymphedema, not elsewhere classified: Secondary | ICD-10-CM | POA: Diagnosis not present

## 2018-03-26 ENCOUNTER — Ambulatory Visit (HOSPITAL_COMMUNITY)
Admission: RE | Admit: 2018-03-26 | Discharge: 2018-03-26 | Disposition: A | Discharge status: Home / Self Care | Payer: Medicare Other | Source: Ambulatory Visit | Attending: Cardiology | Admitting: Cardiology

## 2018-03-26 DIAGNOSIS — L97901 Non-pressure chronic ulcer of unspecified part of unspecified lower leg limited to breakdown of skin: Secondary | ICD-10-CM | POA: Diagnosis not present

## 2018-04-04 DIAGNOSIS — L97812 Non-pressure chronic ulcer of other part of right lower leg with fat layer exposed: Secondary | ICD-10-CM | POA: Diagnosis not present

## 2018-04-04 DIAGNOSIS — I890 Lymphedema, not elsewhere classified: Secondary | ICD-10-CM | POA: Diagnosis not present

## 2018-04-04 DIAGNOSIS — I10 Essential (primary) hypertension: Secondary | ICD-10-CM | POA: Diagnosis not present

## 2018-04-04 DIAGNOSIS — E11622 Type 2 diabetes mellitus with other skin ulcer: Secondary | ICD-10-CM | POA: Diagnosis not present

## 2018-04-04 DIAGNOSIS — I87331 Chronic venous hypertension (idiopathic) with ulcer and inflammation of right lower extremity: Secondary | ICD-10-CM | POA: Diagnosis not present

## 2018-04-04 DIAGNOSIS — E119 Type 2 diabetes mellitus without complications: Secondary | ICD-10-CM | POA: Diagnosis not present

## 2018-04-12 DIAGNOSIS — I87331 Chronic venous hypertension (idiopathic) with ulcer and inflammation of right lower extremity: Secondary | ICD-10-CM | POA: Diagnosis not present

## 2018-04-12 DIAGNOSIS — E11622 Type 2 diabetes mellitus with other skin ulcer: Secondary | ICD-10-CM | POA: Diagnosis not present

## 2018-04-12 DIAGNOSIS — M79604 Pain in right leg: Secondary | ICD-10-CM | POA: Diagnosis not present

## 2018-04-12 DIAGNOSIS — E119 Type 2 diabetes mellitus without complications: Secondary | ICD-10-CM | POA: Diagnosis not present

## 2018-04-12 DIAGNOSIS — L97812 Non-pressure chronic ulcer of other part of right lower leg with fat layer exposed: Secondary | ICD-10-CM | POA: Diagnosis not present

## 2018-04-12 DIAGNOSIS — I10 Essential (primary) hypertension: Secondary | ICD-10-CM | POA: Diagnosis not present

## 2018-04-12 DIAGNOSIS — I890 Lymphedema, not elsewhere classified: Secondary | ICD-10-CM | POA: Diagnosis not present

## 2018-04-12 MED FILL — NAPROXEN 500 MG TABLET: 500 | 30 days supply | Qty: 60 | Fill #0

## 2018-04-17 ENCOUNTER — Encounter (HOSPITAL_BASED_OUTPATIENT_CLINIC_OR_DEPARTMENT_OTHER): Payer: Medicare Other | Attending: Internal Medicine

## 2018-04-17 DIAGNOSIS — Z7984 Long term (current) use of oral hypoglycemic drugs: Secondary | ICD-10-CM | POA: Diagnosis not present

## 2018-04-17 DIAGNOSIS — Z87891 Personal history of nicotine dependence: Secondary | ICD-10-CM | POA: Insufficient documentation

## 2018-04-17 DIAGNOSIS — E11622 Type 2 diabetes mellitus with other skin ulcer: Secondary | ICD-10-CM | POA: Diagnosis not present

## 2018-04-17 DIAGNOSIS — E1165 Type 2 diabetes mellitus with hyperglycemia: Secondary | ICD-10-CM | POA: Diagnosis not present

## 2018-04-17 DIAGNOSIS — I87331 Chronic venous hypertension (idiopathic) with ulcer and inflammation of right lower extremity: Secondary | ICD-10-CM | POA: Diagnosis not present

## 2018-04-17 DIAGNOSIS — L97812 Non-pressure chronic ulcer of other part of right lower leg with fat layer exposed: Secondary | ICD-10-CM | POA: Insufficient documentation

## 2018-04-17 DIAGNOSIS — I10 Essential (primary) hypertension: Secondary | ICD-10-CM | POA: Insufficient documentation

## 2018-04-24 DIAGNOSIS — E11622 Type 2 diabetes mellitus with other skin ulcer: Secondary | ICD-10-CM | POA: Diagnosis not present

## 2018-04-24 DIAGNOSIS — Z7984 Long term (current) use of oral hypoglycemic drugs: Secondary | ICD-10-CM | POA: Diagnosis not present

## 2018-04-24 DIAGNOSIS — I87331 Chronic venous hypertension (idiopathic) with ulcer and inflammation of right lower extremity: Secondary | ICD-10-CM | POA: Diagnosis not present

## 2018-04-24 DIAGNOSIS — L97812 Non-pressure chronic ulcer of other part of right lower leg with fat layer exposed: Secondary | ICD-10-CM | POA: Diagnosis not present

## 2018-04-24 DIAGNOSIS — Z87891 Personal history of nicotine dependence: Secondary | ICD-10-CM | POA: Diagnosis not present

## 2018-04-24 DIAGNOSIS — I10 Essential (primary) hypertension: Secondary | ICD-10-CM | POA: Diagnosis not present

## 2018-04-26 DIAGNOSIS — I10 Essential (primary) hypertension: Secondary | ICD-10-CM | POA: Diagnosis not present

## 2018-04-26 DIAGNOSIS — Z87891 Personal history of nicotine dependence: Secondary | ICD-10-CM | POA: Diagnosis not present

## 2018-04-26 DIAGNOSIS — L97812 Non-pressure chronic ulcer of other part of right lower leg with fat layer exposed: Secondary | ICD-10-CM | POA: Diagnosis not present

## 2018-04-26 DIAGNOSIS — Z7984 Long term (current) use of oral hypoglycemic drugs: Secondary | ICD-10-CM | POA: Diagnosis not present

## 2018-04-26 DIAGNOSIS — I87331 Chronic venous hypertension (idiopathic) with ulcer and inflammation of right lower extremity: Secondary | ICD-10-CM | POA: Diagnosis not present

## 2018-05-01 DIAGNOSIS — I10 Essential (primary) hypertension: Secondary | ICD-10-CM | POA: Diagnosis not present

## 2018-05-01 DIAGNOSIS — L97812 Non-pressure chronic ulcer of other part of right lower leg with fat layer exposed: Secondary | ICD-10-CM | POA: Diagnosis not present

## 2018-05-01 DIAGNOSIS — Z7984 Long term (current) use of oral hypoglycemic drugs: Secondary | ICD-10-CM | POA: Diagnosis not present

## 2018-05-01 DIAGNOSIS — Z87891 Personal history of nicotine dependence: Secondary | ICD-10-CM | POA: Diagnosis not present

## 2018-05-01 DIAGNOSIS — I87331 Chronic venous hypertension (idiopathic) with ulcer and inflammation of right lower extremity: Secondary | ICD-10-CM | POA: Diagnosis not present

## 2018-05-01 DIAGNOSIS — E11622 Type 2 diabetes mellitus with other skin ulcer: Secondary | ICD-10-CM | POA: Diagnosis not present

## 2018-05-08 DIAGNOSIS — E11622 Type 2 diabetes mellitus with other skin ulcer: Secondary | ICD-10-CM | POA: Diagnosis not present

## 2018-05-08 DIAGNOSIS — Z87891 Personal history of nicotine dependence: Secondary | ICD-10-CM | POA: Diagnosis not present

## 2018-05-08 DIAGNOSIS — L97812 Non-pressure chronic ulcer of other part of right lower leg with fat layer exposed: Secondary | ICD-10-CM | POA: Diagnosis not present

## 2018-05-08 DIAGNOSIS — I87331 Chronic venous hypertension (idiopathic) with ulcer and inflammation of right lower extremity: Secondary | ICD-10-CM | POA: Diagnosis not present

## 2018-05-08 DIAGNOSIS — Z7984 Long term (current) use of oral hypoglycemic drugs: Secondary | ICD-10-CM | POA: Diagnosis not present

## 2018-05-08 DIAGNOSIS — I10 Essential (primary) hypertension: Secondary | ICD-10-CM | POA: Diagnosis not present

## 2018-05-15 ENCOUNTER — Encounter (HOSPITAL_BASED_OUTPATIENT_CLINIC_OR_DEPARTMENT_OTHER): Payer: Medicare Other | Attending: Physician Assistant

## 2018-05-15 DIAGNOSIS — L97812 Non-pressure chronic ulcer of other part of right lower leg with fat layer exposed: Secondary | ICD-10-CM | POA: Diagnosis not present

## 2018-05-15 DIAGNOSIS — L98492 Non-pressure chronic ulcer of skin of other sites with fat layer exposed: Secondary | ICD-10-CM | POA: Insufficient documentation

## 2018-05-15 DIAGNOSIS — E11622 Type 2 diabetes mellitus with other skin ulcer: Secondary | ICD-10-CM | POA: Diagnosis not present

## 2018-05-15 DIAGNOSIS — Z87891 Personal history of nicotine dependence: Secondary | ICD-10-CM | POA: Insufficient documentation

## 2018-05-15 DIAGNOSIS — E119 Type 2 diabetes mellitus without complications: Secondary | ICD-10-CM | POA: Insufficient documentation

## 2018-05-15 DIAGNOSIS — I890 Lymphedema, not elsewhere classified: Secondary | ICD-10-CM | POA: Insufficient documentation

## 2018-05-15 DIAGNOSIS — I10 Essential (primary) hypertension: Secondary | ICD-10-CM | POA: Insufficient documentation

## 2018-05-15 DIAGNOSIS — Z7984 Long term (current) use of oral hypoglycemic drugs: Secondary | ICD-10-CM | POA: Diagnosis not present

## 2018-05-15 DIAGNOSIS — I87331 Chronic venous hypertension (idiopathic) with ulcer and inflammation of right lower extremity: Secondary | ICD-10-CM | POA: Diagnosis not present

## 2018-05-21 ENCOUNTER — Ambulatory Visit: Payer: Medicare Other | Admitting: Family Medicine

## 2018-05-29 ENCOUNTER — Ambulatory Visit (HOSPITAL_COMMUNITY)
Admission: EM | Admit: 2018-05-29 | Discharge: 2018-05-29 | Disposition: A | Discharge status: Home / Self Care | Payer: Medicare Other | Attending: Family Medicine | Admitting: Family Medicine

## 2018-05-29 ENCOUNTER — Telehealth: Payer: Self-pay | Admitting: Family Medicine

## 2018-05-29 ENCOUNTER — Encounter (HOSPITAL_COMMUNITY): Payer: Self-pay | Admitting: Emergency Medicine

## 2018-05-29 DIAGNOSIS — E11622 Type 2 diabetes mellitus with other skin ulcer: Secondary | ICD-10-CM | POA: Diagnosis not present

## 2018-05-29 DIAGNOSIS — I890 Lymphedema, not elsewhere classified: Secondary | ICD-10-CM | POA: Diagnosis not present

## 2018-05-29 DIAGNOSIS — I87331 Chronic venous hypertension (idiopathic) with ulcer and inflammation of right lower extremity: Secondary | ICD-10-CM | POA: Diagnosis not present

## 2018-05-29 DIAGNOSIS — L97919 Non-pressure chronic ulcer of unspecified part of right lower leg with unspecified severity: Secondary | ICD-10-CM | POA: Diagnosis not present

## 2018-05-29 DIAGNOSIS — L98492 Non-pressure chronic ulcer of skin of other sites with fat layer exposed: Secondary | ICD-10-CM | POA: Diagnosis not present

## 2018-05-29 DIAGNOSIS — L97812 Non-pressure chronic ulcer of other part of right lower leg with fat layer exposed: Secondary | ICD-10-CM | POA: Diagnosis not present

## 2018-05-29 DIAGNOSIS — Z7984 Long term (current) use of oral hypoglycemic drugs: Secondary | ICD-10-CM | POA: Diagnosis not present

## 2018-05-29 DIAGNOSIS — I10 Essential (primary) hypertension: Secondary | ICD-10-CM | POA: Diagnosis not present

## 2018-05-29 DIAGNOSIS — E119 Type 2 diabetes mellitus without complications: Secondary | ICD-10-CM | POA: Diagnosis not present

## 2018-05-29 MED ORDER — HYDROCODONE-ACETAMINOPHEN 5-325 MG PO TABS: 1.0000 | ORAL_TABLET | Freq: Four times a day (QID) | ORAL | 0 refills | Status: DC | PRN

## 2018-05-29 NOTE — Discharge Instructions (Signed)
Be aware, pain medications may cause drowsiness. Please do not drive, operate heavy machinery or make important decisions while on this medication, it can cloud your judgement.  

## 2018-05-29 NOTE — ED Triage Notes (Signed)
Pt states he has a wound on his R leg that he sees a specialist, went to have his wound debrided today and after his leg has been very painful. Pt taking aleve and tylenol without relief.

## 2018-05-29 NOTE — Telephone Encounter (Signed)
Patient would like to be prescribed something for pain. He say what he is currently taking is not working for him. Please follow up with patient.

## 2018-05-30 NOTE — Telephone Encounter (Signed)
Patient was given pain medication in ED on 05/29/18

## 2018-05-30 NOTE — ED Provider Notes (Signed)
[facility]   [id number] 05/29/18 Arrival Time: 1828  ASSESSMENT & PLAN:  1. Ulcer of right lower extremity, unspecified ulcer stage (HCC)    Meds ordered this encounter  Medications  . HYDROcodone-acetaminophen (NORCO/VICODIN) 5-325 MG tablet    Sig: Take 1 tablet by mouth every 6 (six) hours as needed for moderate pain or severe pain.    Dispense:  10 tablet    Refill:  0   Medication sedation precautions.  Follow-up Information    Schedule an appointment as soon as possible for a visit  with Hoy Register, MD.   Specialty:  Family Medicine Contact information: [address] [place] [address] [phone]          Reviewed expectations re: course of current medical issues. Questions answered. Outlined signs and symptoms indicating need for more acute intervention. Patient verbalized understanding. After Visit Summary given.   SUBJECTIVE:  Anthony Barnes is a 62 y.o. male who presents with a skin complaint.   Location: R lower leg  Duration: months; sees wound care; debrided today after 2 week hiatus; reports significant pain after debridement OTC Tylenol and ibuprofen with inadequate pain relief; requests something stronger. No drainage or bleeding. Afebrile. Ambulatory without difficulty. No specific aggravating or alleviating factors reported.  ROS: As per HPI.  OBJECTIVE: Vitals:   05/29/18 1901  BP: (!) 172/99  Pulse: 79  Resp: 18  Temp: 98.1 F (36.7 C)  SpO2: 97%    General appearance: alert; no distress Lungs: unlabored Heart: regular rate and rhythm Extremities: no edema Skin: warm and dry; chronic ulcer of RLE; reports significant tenderness to edges Neuro: gait normal Psychological: alert and cooperative; normal mood and affect  Allergies  Allergen Reactions  . Shellfish Allergy Anaphylaxis  . Codeine Itching    unknown    Past Medical History:  Diagnosis Date  . Diabetes mellitus without complication  (HCC)   . Hypertension    Social History   Socioeconomic History  . Marital status: Married    Spouse name: Not on file  . Number of children: Not on file  . Years of education: Not on file  . Highest education level: Not on file  Occupational History  . Not on file  Social Needs  . Financial resource strain: Not on file  . Food insecurity:    Worry: Not on file    Inability: Not on file  . Transportation needs:    Medical: Not on file    Non-medical: Not on file  Tobacco Use  . Smoking status: Former Smoker    Packs/day: 1.00    Years: 16.00    Pack years: 16.00    Types: Cigarettes    Start date: 08/14/1974    Last attempt to quit: 08/14/1990    Years since quitting: 27.8  . Smokeless tobacco: Former Neurosurgeon    Quit date: 08/14/1990  Substance and Sexual Activity  . Alcohol use: Yes    Alcohol/week: 4.0 standard drinks    Types: 2 Cans of beer, 2 Shots of liquor per week    Comment: occas  . Drug use: No  . Sexual activity: Not on file  Lifestyle  . Physical activity:    Days per week: Not on file    Minutes per session: Not on file  . Stress: Not on file  Relationships  . Social connections:    Talks on phone: Not on file    Gets together: Not on file  Attends religious service: Not on file    Active member of club or organization: Not on file    Attends meetings of clubs or organizations: Not on file    Relationship status: Not on file  . Intimate partner violence:    Fear of current or ex partner: Not on file    Emotionally abused: Not on file    Physically abused: Not on file    Forced sexual activity: Not on file  Other Topics Concern  . Not on file  Social History Narrative  . Not on file   Family History  Problem Relation Age of Onset  . Hypertension Mother   . Cancer Mother 31       ovarian  . Diabetes Father   . Stroke Father    Past Surgical History:  Procedure Laterality Date  . ESOPHAGOGASTRODUODENOSCOPY N/A 03/22/2013   Procedure:  ESOPHAGOGASTRODUODENOSCOPY (EGD);  Surgeon: Graylin Shiver, MD;  Location: [facility] ENDOSCOPY;  Service: Endoscopy;  Laterality: N/AMardella Layman, MD 05/30/18 [phone]

## 2018-06-03 ENCOUNTER — Encounter (HOSPITAL_COMMUNITY): Payer: Self-pay | Admitting: *Deleted

## 2018-06-03 ENCOUNTER — Other Ambulatory Visit: Payer: Self-pay

## 2018-06-03 ENCOUNTER — Telehealth: Payer: Self-pay | Admitting: Family Medicine

## 2018-06-03 ENCOUNTER — Ambulatory Visit (HOSPITAL_COMMUNITY)
Admission: EM | Admit: 2018-06-03 | Discharge: 2018-06-03 | Disposition: A | Discharge status: Home / Self Care | Payer: Medicare Other | Attending: Family Medicine | Admitting: Family Medicine

## 2018-06-03 DIAGNOSIS — L97919 Non-pressure chronic ulcer of unspecified part of right lower leg with unspecified severity: Secondary | ICD-10-CM

## 2018-06-03 DIAGNOSIS — E119 Type 2 diabetes mellitus without complications: Secondary | ICD-10-CM | POA: Diagnosis not present

## 2018-06-03 DIAGNOSIS — Z760 Encounter for issue of repeat prescription: Secondary | ICD-10-CM

## 2018-06-03 DIAGNOSIS — Z5189 Encounter for other specified aftercare: Secondary | ICD-10-CM | POA: Diagnosis not present

## 2018-06-03 DIAGNOSIS — G8929 Other chronic pain: Secondary | ICD-10-CM

## 2018-06-03 MED ORDER — TRAMADOL HCL 50 MG PO TABS: 50.0000 mg | ORAL_TABLET | Freq: Two times a day (BID) | ORAL | 0 refills | Status: DC | PRN

## 2018-06-03 MED ORDER — HYDROCODONE-ACETAMINOPHEN 5-325 MG PO TABS: 1.0000 | ORAL_TABLET | ORAL | 0 refills | Status: DC | PRN

## 2018-06-03 NOTE — Telephone Encounter (Signed)
We do not prescribe Vicodin here in the clinic but I have sent a prescription for tramadol to his pharmacy.

## 2018-06-03 NOTE — Discharge Instructions (Addendum)
Take the pain medicine as needed Make certain that you do not take more than 10-3998 mg acetaminophen a day ALL SOURCES See your PCP next week as scheduled We DO NOT DO chronic pain management

## 2018-06-03 NOTE — Telephone Encounter (Signed)
Pt called to request a medication refill until his HFU on 06/12/2018 for leg pain , please follow up -HYDROcodone-acetaminophen (NORCO/VICODIN) 5-325 MG tablet To [id number] - [place], [place] - [address] AT [facility] OF [address]

## 2018-06-03 NOTE — Telephone Encounter (Signed)
Pt came in and received note left by his provider,  he understood and agreed to follow up on his appt

## 2018-06-03 NOTE — ED Provider Notes (Signed)
[facility]    CSN: [id number] Arrival date & time: 06/03/18  1504     History   Chief Complaint Chief Complaint  Patient presents with  . Wound Check    HPI Anthony Barnes is a 62 y.o. male.   HPI  See note Dr Tracie Harrier from last week He is here for pain management He has been released by wound clinic to do his own dressing changes and it is painful He called his MD today for a refill of pain medicine and was offered tramadol.  He did not pick up the Rx because he says "it does not work". We discussed that the urgent care center does not provide chronic pain management.  He has made an appointment to see his PCP next week and has an appointment for Wednesday.  He states he wishes to discuss pain management with her.  He states that if his doctor cannot help him with his pain, he is going to find another doctor.  I told him that I can only give him enough medicine to last until he has this appointment, 20 tablets.  I told him that medicine will not be refilled at this clinic further.  Past Medical History:  Diagnosis Date  . Diabetes mellitus without complication (HCC)   . Hypertension     Patient Active Problem List   Diagnosis Date Noted  . Morbid obesity (HCC) 03/13/2016  . Dental caries 09/09/2015  . Diabetes (HCC) 10/20/2013  . DM (diabetes mellitus) type 2, uncontrolled, with ketoacidosis (HCC) 04/01/2013  . Essential hypertension, benign 04/01/2013  . GERD (gastroesophageal reflux disease) 04/01/2013  . Erosive gastritis 03/22/2013  . DM2 (diabetes mellitus, type 2) (HCC) 03/21/2013  . Unspecified essential hypertension 03/21/2013  . Hyponatremia 03/21/2013  . Epigastric pain 03/20/2013  . HTN (hypertension) 03/20/2013    Past Surgical History:  Procedure Laterality Date  . BRAIN SURGERY    . ESOPHAGOGASTRODUODENOSCOPY N/A 03/22/2013   Procedure: ESOPHAGOGASTRODUODENOSCOPY (EGD);  Surgeon: Graylin Shiver, MD;  Location: [facility] ENDOSCOPY;  Service:  Endoscopy;  Laterality: N/A;       Home Medications    Prior to Admission medications   Medication Sig Start Date End Date Taking? Authorizing Provider  amLODipine (NORVASC) 10 MG tablet Take 1 tablet (10 mg total) by mouth daily. 01/15/18   Hoy Register, MD  atorvastatin (LIPITOR) 40 MG tablet Take 1 tablet (40 mg total) by mouth daily. 01/15/18   Hoy Register, MD  Blood Glucose Monitoring Suppl (ACCU-CHEK AVIVA) device Use as instructed 3 times daily before meals. 01/16/18   Hoy Register, MD  carvedilol (COREG) 12.5 MG tablet Take 1 tablet (12.5 mg total) by mouth 2 (two) times daily with a meal. 01/15/18   Hoy Register, MD  cloNIDine (CATAPRES) 0.3 MG tablet Take 1 tablet (0.3 mg total) by mouth 2 (two) times daily. 01/15/18   Hoy Register, MD  glipiZIDE (GLUCOTROL) 10 MG tablet Take 1 tablet (10 mg total) by mouth 2 (two) times daily before a meal. 01/15/18   Hoy Register, MD  glucose blood (ACCU-CHEK AVIVA) test strip Use 3 times daily before meals 01/16/18   Hoy Register, MD  HYDROcodone-acetaminophen (NORCO/VICODIN) 5-325 MG tablet Take 1 tablet by mouth every 4 (four) hours as needed for moderate pain or severe pain. 06/03/18   Eustace Moore, MD  ibuprofen (ADVIL,MOTRIN) 800 MG tablet Take 1 tablet (800 mg total) by mouth every 12 (twelve) hours as needed. 02/19/18   Hoy Register, MD  Lancets (ACCU-CHEK MULTICLIX) lancets Use as instructed 3 times daily before meals 01/16/18   Hoy Register, MD  lisinopril (PRINIVIL,ZESTRIL) 40 MG tablet Take 1 tablet (40 mg total) by mouth daily. 01/15/18   Hoy Register, MD  Multiple Vitamin (MULTIVITAMIN WITH MINERALS) TABS tablet Take 1 tablet by mouth daily.    [provider]  omega-3 acid ethyl esters (LOVAZA) 1 g capsule Take 1 g by mouth 2 (two) times daily.    [provider]  POTASSIUM PO Take 1-2 tablets by mouth daily.    [provider]  traMADol (ULTRAM) 50 MG tablet Take 1 tablet (50 mg total) by  mouth every 12 (twelve) hours as needed. 06/03/18   Hoy Register, MD    Family History Family History  Problem Relation Age of Onset  . Hypertension Mother   . Cancer Mother 56       ovarian  . Diabetes Father   . Stroke Father     Social History Social History   Tobacco Use  . Smoking status: Former Smoker    Packs/day: 1.00    Years: 16.00    Pack years: 16.00    Types: Cigarettes    Start date: 08/14/1974    Last attempt to quit: 08/14/1990    Years since quitting: 27.8  . Smokeless tobacco: Former Neurosurgeon    Quit date: 08/14/1990  Substance Use Topics  . Alcohol use: Yes    Alcohol/week: 4.0 standard drinks    Types: 2 Cans of beer, 2 Shots of liquor per week    Comment: occas  . Drug use: No     Allergies   Shellfish allergy and Codeine   Review of Systems Review of Systems  Constitutional: Negative for chills and fever.  HENT: Negative for ear pain and sore throat.   Eyes: Negative for pain and visual disturbance.  Respiratory: Negative for cough and shortness of breath.   Cardiovascular: Negative for chest pain and palpitations.  Gastrointestinal: Negative for abdominal pain and vomiting.  Genitourinary: Negative for dysuria and hematuria.  Musculoskeletal: Negative for arthralgias and back pain.  Skin: Positive for wound. Negative for color change and rash.  Neurological: Negative for seizures and syncope.  All other systems reviewed and are negative.    Physical Exam Triage Vital Signs ED Triage Vitals  Enc Vitals Group     BP 06/03/18 1547 (!) 175/100     Pulse Rate 06/03/18 1547 74     Resp 06/03/18 1547 16     Temp --      Temp Source 06/03/18 1547 Oral     SpO2 06/03/18 1547 100 %     Weight --      Height --      Head Circumference --      Peak Flow --      Pain Score 06/03/18 1549 7     Pain Loc --      Pain Edu? --      Excl. in GC? --    No data found.  Updated Vital Signs BP (!) 175/100 (BP Location: Right Arm)   Pulse 74    Resp 16   SpO2 100%       Physical Exam  Constitutional: He appears well-developed and well-nourished. No distress.  HENT:  Head: Normocephalic and atraumatic.  Mouth/Throat: Oropharynx is clear and moist.  Eyes: Pupils are equal, round, and reactive to light. Conjunctivae are normal.  Neck: Normal range of motion.  Cardiovascular: Normal rate.  Pulmonary/Chest: Effort normal. No respiratory distress.  Abdominal: Soft. He exhibits no distension.  Musculoskeletal: Normal range of motion. He exhibits no edema.  Neurological: He is alert.  Skin: Skin is warm and dry.  Wound dressing on right anterior shin  Psychiatric: He has a normal mood and affect. His behavior is normal.     UC Treatments / Results  Labs (all labs ordered are listed, but only abnormal results are displayed) Labs Reviewed - No data to display  EKG None  Radiology No results found.  Procedures Procedures (including critical care time)  Medications Ordered in UC Medications - No data to display  Initial Impression / Assessment and Plan / UC Course  I have reviewed the triage vital signs and the nursing notes.  Pertinent labs & imaging results that were available during my care of the patient were reviewed by me and considered in my medical decision making (see chart for details).     Checked the [employer] for narcotic, and this patient has had medicine once 6 months ago, and once from Dr. Tracie Harrier last week.  No ongoing prescriptions.  No suspicion of drug-seeking behavior.  I did discuss with him the pitfalls of taking narcotic pain medicine on a daily basis for chronic pain.  He states he he only needs this until his wound heals, and the wound is getting smaller over time.  We also discussed that this office would not provide ongoing pain medications for him.  Discussed not driving, discussed constipation and side effects Final Clinical Impressions(s) / UC Diagnoses   Final diagnoses:    Visit for wound check     Discharge Instructions     Take the pain medicine as needed Make certain that you do not take more than 10-3998 mg acetaminophen a day ALL SOURCES See your PCP next week as scheduled We DO NOT DO chronic pain management   ED Prescriptions    Medication Sig Dispense Auth. Provider   HYDROcodone-acetaminophen (NORCO/VICODIN) 5-325 MG tablet Take 1 tablet by mouth every 4 (four) hours as needed for moderate pain or severe pain. 20 tablet Eustace Moore, MD     Controlled Substance Prescriptions [place] Controlled Substance Registry consulted? Yes, I have consulted the [place] Controlled Substances Registry for this patient, and feel the risk/benefit ratio today is favorable for proceeding with this prescription for a controlled substance.   Eustace Moore, MD 06/03/18 [phone]

## 2018-06-03 NOTE — ED Triage Notes (Signed)
States he has a wound on his right leg, states he was seen here last week and got pain medication however he has ran out of his medication, his PCP can't see him until [place].

## 2018-06-11 NOTE — Progress Notes (Signed)
Patient ID: Anthony Barnes, male   DOB: July 26, 1956, 62 y.o.   MRN: [id number]     Anthony Barnes, is a 62 y.o. male  OZH:[id number]  GEX:[id number]  DOB - May 05, 1956  Subjective:  Chief Complaint and HPI: Anthony Barnes is a 62 y.o. male here today for a follow up visit  After being seen in the ED 06/03/2018 for pain meds for pain with a healing wound.  [place] database was searched at that time and patient only had one prescription in 6 months.  He was given #20 Vicodin and advised to f/up here.  Wants referral to pain management.  Says nothing but hydrocodone works for pain.  Has appt at wound clinic today.  This has been chronic since the summer.   Not checking blood sugars.  Needs test strips.   Just took BP meds about 30 mins before coming in  ED/Hospital notes reviewed.   Social History:  Married, not working   ROS:   Constitutional:  No f/c, No night sweats, No unexplained weight loss. EENT:  No vision changes, No blurry vision, No hearing changes. No mouth, throat, or ear problems.  Respiratory: No cough, No SOB Cardiac: No CP, no palpitations GI:  No abd pain, No N/V/D. GU: No Urinary s/sx Musculoskeletal: No joint pain Neuro: No headache, no dizziness, no motor weakness.  Skin: No rash Endocrine:  No polydipsia. No polyuria.  Psych: Denies SI/HI  No problems updated.  ALLERGIES: Allergies  Allergen Reactions  . Shellfish Allergy Anaphylaxis  . Codeine Itching    unknown    PAST MEDICAL HISTORY: Past Medical History:  Diagnosis Date  . Diabetes mellitus without complication (HCC)   . Hypertension     MEDICATIONS AT HOME: Prior to Admission medications   Medication Sig Start Date End Date Taking? Authorizing Provider  amLODipine (NORVASC) 10 MG tablet Take 1 tablet (10 mg total) by mouth daily. 01/15/18  Yes Hoy Register, MD  atorvastatin (LIPITOR) 40 MG tablet Take 1 tablet (40 mg total) by mouth daily. 01/15/18  Yes Hoy Register, MD  Blood Glucose  Monitoring Suppl (ACCU-CHEK AVIVA) device Use as instructed 3 times daily before meals. 01/16/18  Yes Hoy Register, MD  cloNIDine (CATAPRES) 0.3 MG tablet Take 1 tablet (0.3 mg total) by mouth 2 (two) times daily. 01/15/18  Yes Hoy Register, MD  glipiZIDE (GLUCOTROL) 10 MG tablet Take 1 tablet (10 mg total) by mouth 2 (two) times daily before a meal. 01/15/18  Yes Newlin, Enobong, MD  glucose blood (ACCU-CHEK AVIVA) test strip Use 3 times daily before meals 06/12/18  Yes McClung, Angela M, PA-C  ibuprofen (ADVIL,MOTRIN) 800 MG tablet Take 1 tablet (800 mg total) by mouth every 12 (twelve) hours as needed. 06/12/18  Yes Georgian Co M, PA-C  Lancets (ACCU-CHEK MULTICLIX) lancets Use as instructed 3 times daily before meals 01/16/18  Yes Newlin, Enobong, MD  lisinopril (PRINIVIL,ZESTRIL) 40 MG tablet Take 1 tablet (40 mg total) by mouth daily. 01/15/18  Yes Hoy Register, MD  Multiple Vitamin (MULTIVITAMIN WITH MINERALS) TABS tablet Take 1 tablet by mouth daily.   Yes [provider]  omega-3 acid ethyl esters (LOVAZA) 1 g capsule Take 1 g by mouth 2 (two) times daily.   Yes [provider]  POTASSIUM PO Take 1-2 tablets by mouth daily.   Yes [provider]  traMADol (ULTRAM) 50 MG tablet Take 1 tablet (50 mg total) by mouth every 12 (twelve) hours as needed. 06/03/18  Yes Newlin, Enobong,  MD  acetaminophen-codeine (TYLENOL #3) 300-30 MG tablet Take 1 tablet by mouth every 8 (eight) hours as needed for moderate pain. Take with zyrtec 10mg  daily 06/12/18   Anders Simmonds, PA-C     Objective:  EXAM:   Vitals:   06/12/18 0853  BP: (!) 161/93  Pulse: 79  Resp: 18  Temp: 98.2 F (36.8 C)  TempSrc: Oral  SpO2: 98%  Weight: (!) 320 lb (145.2 kg)  Height: 6\' 4"  (1.93 m)    General appearance : A&OX3. NAD. Non-toxic-appearing HEENT: Atraumatic and Normocephalic.  PERRLA. EOM intact.   Neck: supple, no JVD. No cervical lymphadenopathy. No thyromegaly Chest/Lungs:   Breathing-non-labored, Good air entry bilaterally, breath sounds normal without rales, rhonchi, or wheezing  CVS: S1 S2 regular, no murmurs, gallops, rubs  Extremities: Bilateral Lower Ext shows no edema, both legs are warm to touch with = pulse throughout Neurology:  CN II-XII grossly intact, Non focal.   Psych:  TP linear. J/I WNL. Normal speech. Appropriate eye contact and affect.  Skin:  No Rash  Data Review Lab Results  Component Value Date   HGBA1C 7.2 (A) 06/12/2018   HGBA1C 8.2 (A) 01/15/2018   HGBA1C 7.4 06/25/2017     Assessment & Plan   1. Ulcer of right lower extremity, limited to breakdown of skin (HCC) - ibuprofen (ADVIL,MOTRIN) 800 MG tablet; Take 1 tablet (800 mg total) by mouth every 12 (twelve) hours as needed.  Dispense: 30 tablet; Refill: 0 - Ambulatory referral to Pain Clinic - acetaminophen-codeine (TYLENOL #3) 300-30 MG tablet; Take 1 tablet by mouth every 8 (eight) hours as needed for moderate pain. Take with zyrtec 10mg  daily  Dispense: 30 tablet; Refill: 0 Can take zyrtec with this to reduce itching  2. Type 2 diabetes mellitus without complication, without long-term current use of insulin (HCC) Improved but not controlled-continue current regimen-eliminate sugars from diet.  Check blood sugars fasting and record and bring to next visit - POCT glycosylated hemoglobin (Hb A1C) - Glucose (CBG) - glucose blood (ACCU-CHEK AVIVA) test strip; Use 3 times daily before meals  Dispense: 100 each; Refill: 12  3. Encounter for examination following treatment at hospital    4. Essential hypertension Uncontrolled.  Check BP OOO 3-5 times/week and record and bring to next visit. Compliance with meds and DASH diet advised.  Continue current regimen.     Patient have been counseled extensively about nutrition and exercise  Return in about 3 weeks (around 07/03/2018) for Dr Alvis Lemmings for BP and DM.  The patient was given clear instructions to go to ER or return to  medical center if symptoms don't improve, worsen or new problems develop. The patient verbalized understanding. The patient was told to call to get lab results if they haven't heard anything in the next week.     Georgian Co, PA-C [facility] [place], [place] [phone]   06/12/2018, 9:09 AM

## 2018-06-12 ENCOUNTER — Ambulatory Visit: Payer: Medicare Other | Attending: Family Medicine | Admitting: Physician Assistant

## 2018-06-12 VITALS — BP 161/93 | HR 79 | Temp 98.2°F | Resp 18 | Ht 76.0 in | Wt 320.0 lb

## 2018-06-12 DIAGNOSIS — E119 Type 2 diabetes mellitus without complications: Secondary | ICD-10-CM | POA: Insufficient documentation

## 2018-06-12 DIAGNOSIS — Z79899 Other long term (current) drug therapy: Secondary | ICD-10-CM | POA: Diagnosis not present

## 2018-06-12 DIAGNOSIS — L98492 Non-pressure chronic ulcer of skin of other sites with fat layer exposed: Secondary | ICD-10-CM | POA: Diagnosis not present

## 2018-06-12 DIAGNOSIS — Z791 Long term (current) use of non-steroidal anti-inflammatories (NSAID): Secondary | ICD-10-CM | POA: Diagnosis not present

## 2018-06-12 DIAGNOSIS — L97812 Non-pressure chronic ulcer of other part of right lower leg with fat layer exposed: Secondary | ICD-10-CM | POA: Diagnosis not present

## 2018-06-12 DIAGNOSIS — L97911 Non-pressure chronic ulcer of unspecified part of right lower leg limited to breakdown of skin: Secondary | ICD-10-CM | POA: Insufficient documentation

## 2018-06-12 DIAGNOSIS — Z885 Allergy status to narcotic agent status: Secondary | ICD-10-CM | POA: Insufficient documentation

## 2018-06-12 DIAGNOSIS — Z7984 Long term (current) use of oral hypoglycemic drugs: Secondary | ICD-10-CM | POA: Diagnosis not present

## 2018-06-12 DIAGNOSIS — I10 Essential (primary) hypertension: Secondary | ICD-10-CM | POA: Diagnosis not present

## 2018-06-12 DIAGNOSIS — Z09 Encounter for follow-up examination after completed treatment for conditions other than malignant neoplasm: Secondary | ICD-10-CM | POA: Diagnosis not present

## 2018-06-12 DIAGNOSIS — Z794 Long term (current) use of insulin: Secondary | ICD-10-CM | POA: Diagnosis not present

## 2018-06-12 DIAGNOSIS — I890 Lymphedema, not elsewhere classified: Secondary | ICD-10-CM | POA: Diagnosis not present

## 2018-06-12 DIAGNOSIS — I87331 Chronic venous hypertension (idiopathic) with ulcer and inflammation of right lower extremity: Secondary | ICD-10-CM | POA: Diagnosis not present

## 2018-06-12 LAB — GLUCOSE, POCT (MANUAL RESULT ENTRY): POC Glucose: 123 mg/dl — AB (ref 70–99)

## 2018-06-12 LAB — POCT GLYCOSYLATED HEMOGLOBIN (HGB A1C): HbA1c, POC (controlled diabetic range): 7.2 % — AB (ref 0.0–7.0)

## 2018-06-12 MED ORDER — IBUPROFEN 800 MG PO TABS: 800.0000 mg | ORAL_TABLET | Freq: Two times a day (BID) | ORAL | 0 refills | Status: DC | PRN

## 2018-06-12 MED ORDER — GLUCOSE BLOOD VI STRP: ORAL_STRIP | 12 refills | Status: DC

## 2018-06-12 MED ORDER — ACETAMINOPHEN-CODEINE #3 300-30 MG PO TABS: 1.0000 | ORAL_TABLET | Freq: Three times a day (TID) | ORAL | 0 refills | Status: DC | PRN

## 2018-06-12 MED FILL — IBUPROFEN 800 MG TABLET: 800 | 15 days supply | Qty: 30 | Fill #0

## 2018-06-12 MED FILL — ACCU-CHEK AVIVA PLUS TEST S: 30 days supply | Qty: 100 | Fill #0

## 2018-06-12 MED FILL — ACETAMINOPHEN/COD #3 TABLET: 300-30 | 10 days supply | Qty: 30 | Fill #0

## 2018-06-12 NOTE — Patient Instructions (Signed)
Check blood pressure 3 to 5 times a week and record and bring to next visit.    Check blood sugars fasting and record and bring to next visit.  Eliminate sugars and white carbohydrates from your diet.

## 2018-06-19 ENCOUNTER — Encounter (HOSPITAL_BASED_OUTPATIENT_CLINIC_OR_DEPARTMENT_OTHER): Payer: Medicare Other | Attending: Internal Medicine

## 2018-06-19 DIAGNOSIS — Z7984 Long term (current) use of oral hypoglycemic drugs: Secondary | ICD-10-CM | POA: Diagnosis not present

## 2018-06-19 DIAGNOSIS — L97812 Non-pressure chronic ulcer of other part of right lower leg with fat layer exposed: Secondary | ICD-10-CM | POA: Diagnosis not present

## 2018-06-19 DIAGNOSIS — Z87891 Personal history of nicotine dependence: Secondary | ICD-10-CM | POA: Insufficient documentation

## 2018-06-19 DIAGNOSIS — I87331 Chronic venous hypertension (idiopathic) with ulcer and inflammation of right lower extremity: Secondary | ICD-10-CM | POA: Insufficient documentation

## 2018-06-19 DIAGNOSIS — I10 Essential (primary) hypertension: Secondary | ICD-10-CM | POA: Diagnosis not present

## 2018-06-19 DIAGNOSIS — L97212 Non-pressure chronic ulcer of right calf with fat layer exposed: Secondary | ICD-10-CM | POA: Diagnosis not present

## 2018-06-19 DIAGNOSIS — E11622 Type 2 diabetes mellitus with other skin ulcer: Secondary | ICD-10-CM | POA: Insufficient documentation

## 2018-06-19 DIAGNOSIS — I890 Lymphedema, not elsewhere classified: Secondary | ICD-10-CM | POA: Diagnosis not present

## 2018-06-25 ENCOUNTER — Ambulatory Visit: Payer: Medicare Other | Attending: Family Medicine | Admitting: Family Medicine

## 2018-06-25 ENCOUNTER — Encounter: Payer: Self-pay | Admitting: Family Medicine

## 2018-06-25 VITALS — BP 179/98 | HR 78 | Temp 97.8°F | Ht 76.0 in | Wt 317.2 lb

## 2018-06-25 DIAGNOSIS — E11622 Type 2 diabetes mellitus with other skin ulcer: Secondary | ICD-10-CM | POA: Diagnosis not present

## 2018-06-25 DIAGNOSIS — I10 Essential (primary) hypertension: Secondary | ICD-10-CM | POA: Insufficient documentation

## 2018-06-25 DIAGNOSIS — Z7984 Long term (current) use of oral hypoglycemic drugs: Secondary | ICD-10-CM | POA: Diagnosis not present

## 2018-06-25 DIAGNOSIS — Z885 Allergy status to narcotic agent status: Secondary | ICD-10-CM | POA: Diagnosis not present

## 2018-06-25 DIAGNOSIS — Z794 Long term (current) use of insulin: Secondary | ICD-10-CM

## 2018-06-25 DIAGNOSIS — E785 Hyperlipidemia, unspecified: Secondary | ICD-10-CM | POA: Insufficient documentation

## 2018-06-25 DIAGNOSIS — Z791 Long term (current) use of non-steroidal anti-inflammatories (NSAID): Secondary | ICD-10-CM | POA: Insufficient documentation

## 2018-06-25 DIAGNOSIS — Z79899 Other long term (current) drug therapy: Secondary | ICD-10-CM | POA: Diagnosis not present

## 2018-06-25 DIAGNOSIS — L97911 Non-pressure chronic ulcer of unspecified part of right lower leg limited to breakdown of skin: Secondary | ICD-10-CM | POA: Insufficient documentation

## 2018-06-25 DIAGNOSIS — E119 Type 2 diabetes mellitus without complications: Secondary | ICD-10-CM | POA: Diagnosis not present

## 2018-06-25 DIAGNOSIS — E7800 Pure hypercholesterolemia, unspecified: Secondary | ICD-10-CM

## 2018-06-25 DIAGNOSIS — Z6838 Body mass index (BMI) 38.0-38.9, adult: Secondary | ICD-10-CM | POA: Insufficient documentation

## 2018-06-25 LAB — GLUCOSE, POCT (MANUAL RESULT ENTRY): POC GLUCOSE: 118 mg/dL — AB (ref 70–99)

## 2018-06-25 MED ORDER — CARVEDILOL 12.5 MG PO TABS: 12.5000 mg | ORAL_TABLET | Freq: Two times a day (BID) | ORAL | 1 refills | Status: DC

## 2018-06-25 MED ORDER — ATORVASTATIN CALCIUM 40 MG PO TABS: 40.0000 mg | ORAL_TABLET | Freq: Every day | ORAL | 1 refills | Status: DC

## 2018-06-25 MED ORDER — IBUPROFEN 800 MG PO TABS: 800.0000 mg | ORAL_TABLET | Freq: Two times a day (BID) | ORAL | 0 refills | Status: DC | PRN

## 2018-06-25 MED ORDER — CLONIDINE HCL 0.3 MG PO TABS: 0.3000 mg | ORAL_TABLET | Freq: Two times a day (BID) | ORAL | 1 refills | Status: DC

## 2018-06-25 MED ORDER — AMLODIPINE BESYLATE 10 MG PO TABS: 10.0000 mg | ORAL_TABLET | Freq: Every day | ORAL | 1 refills | Status: DC

## 2018-06-25 MED ORDER — LISINOPRIL 40 MG PO TABS: 40.0000 mg | ORAL_TABLET | Freq: Every day | ORAL | 1 refills | Status: DC

## 2018-06-25 MED ORDER — CARVEDILOL 12.5 MG PO TABS: 12.5000 mg | ORAL_TABLET | Freq: Two times a day (BID) | ORAL | 3 refills | Status: DC

## 2018-06-25 MED ORDER — GLIPIZIDE 10 MG PO TABS: 10.0000 mg | ORAL_TABLET | Freq: Two times a day (BID) | ORAL | 1 refills | Status: DC

## 2018-06-25 NOTE — Patient Instructions (Signed)

## 2018-06-25 NOTE — Progress Notes (Signed)
Subjective:  Patient ID: Anthony Barnes, male    DOB: 10/06/1955  Age: 62 y.o. MRN: [id number]  CC: Diabetes   HPI Anthony Barnes  is a 62 year old male with a history of hypertension, type 2 diabetes mellitus A1c 7.2, morbid obesity who comes into the clinic for follow-up of the right leg ulcer. A1c 7.2 which is down from 8.2 previously and he endorses working on his diet and has cut out a lot of starches and is eating more vegetables.  He is not exercising as much but has been compliant with his medications.  Denies visual concerns but is yet to have his annual eye exam.  He also denies numbness in extremities. His blood pressure is elevated and he endorses running out of carvedilol but denies chest pains, shortness of breath or pedal edema. Tolerating his statin with no complaints of myalgias. Currently under the care of wound care for chronic right leg ulcer which was debrided and resulted in severe pain.  This pain was uncontrolled on tramadol and the Tylenol 3 he received from the PA at his last visit caused itching.  The only medication which helped was a stronger opioid he received at an urgent care center but he was afraid he was developing tolerance to it that he quit.  He was referred to the pain management center at [facility] pain management but was unable to secure an appointment with them.  He no longer wants to see pain management.  Past Medical History:  Diagnosis Date  . Diabetes mellitus without complication ([place])   . Hypertension     Past Surgical History:  Procedure Laterality Date  . BRAIN SURGERY    . ESOPHAGOGASTRODUODENOSCOPY N/A 03/22/2013   Procedure: ESOPHAGOGASTRODUODENOSCOPY (EGD);  Surgeon: Wonda Horner, MD;  Location[facility] ENDOSCOPY;  Service: Endoscopy;  Laterality: N/A;    Allergies  Allergen Reactions  . Shellfish Allergy Anaphylaxis  . Codeine Itching    unknown     Outpatient Medications Prior to Visit  Medication Sig Dispense Refill  .  acetaminophen-codeine (TYLENOL #3) 300-30 MG tablet Take 1 tablet by mouth every 8 (eight) hours as needed for moderate pain. Take with zyrtec 39m daily 30 tablet 0  . Blood Glucose Monitoring Suppl (ACCU-CHEK AVIVA) device Use as instructed 3 times daily before meals. 1 each 0  . glucose blood (ACCU-CHEK AVIVA) test strip Use 3 times daily before meals 100 each 12  . Lancets (ACCU-CHEK MULTICLIX) lancets Use as instructed 3 times daily before meals 100 each 12  . Multiple Vitamin (MULTIVITAMIN WITH MINERALS) TABS tablet Take 1 tablet by mouth daily.    .Marland Kitchenomega-3 acid ethyl esters (LOVAZA) 1 g capsule Take 1 g by mouth 2 (two) times daily.    .Marland KitchenPOTASSIUM PO Take 1-2 tablets by mouth daily.    .Marland KitchenamLODipine (NORVASC) 10 MG tablet Take 1 tablet (10 mg total) by mouth daily. 90 tablet 1  . atorvastatin (LIPITOR) 40 MG tablet Take 1 tablet (40 mg total) by mouth daily. 90 tablet 1  . cloNIDine (CATAPRES) 0.3 MG tablet Take 1 tablet (0.3 mg total) by mouth 2 (two) times daily. 180 tablet 1  . glipiZIDE (GLUCOTROL) 10 MG tablet Take 1 tablet (10 mg total) by mouth 2 (two) times daily before a meal. 180 tablet 1  . ibuprofen (ADVIL,MOTRIN) 800 MG tablet Take 1 tablet (800 mg total) by mouth every 12 (twelve) hours as needed. 30 tablet 0  . lisinopril (PRINIVIL,ZESTRIL) 40 MG tablet  Take 1 tablet (40 mg total) by mouth daily. 90 tablet 1  . traMADol (ULTRAM) 50 MG tablet Take 1 tablet (50 mg total) by mouth every 12 (twelve) hours as needed. (Patient not taking: Reported on 06/25/2018) 30 tablet 0   No facility-administered medications prior to visit.     ROS Review of Systems  Constitutional: Negative for activity change and appetite change.  HENT: Negative for sinus pressure and sore throat.   Eyes: Negative for visual disturbance.  Respiratory: Negative for cough, chest tightness and shortness of breath.   Cardiovascular: Negative for chest pain and leg swelling.  Gastrointestinal: Negative for  abdominal distention, abdominal pain, constipation and diarrhea.  Endocrine: Negative.   Genitourinary: Negative for dysuria.  Musculoskeletal: Negative for joint swelling and myalgias.  Skin: Positive for wound.  Allergic/Immunologic: Negative.   Neurological: Negative for weakness, light-headedness and numbness.  Psychiatric/Behavioral: Negative for dysphoric mood and suicidal ideas.    Objective:  BP (!) 179/98   Pulse 78   Temp 97.8 F (36.6 C) (Oral)   Ht 6' 4"  (1.93 m)   Wt (!) 317 lb 3.2 oz (143.9 kg)   SpO2 100%   BMI 38.61 kg/m   BP/Weight 06/25/2018 06/12/2018 17/79/3903  Systolic BP 009 233 007  Diastolic BP 98 93 622  Wt. (Lbs) 317.2 320 -  BMI 38.61 38.95 -      Physical Exam  Constitutional: He is oriented to person, place, and time. He appears well-developed and well-nourished.  Cardiovascular: Normal rate, normal heart sounds and intact distal pulses.  No murmur heard. Pulmonary/Chest: Effort normal and breath sounds normal. He has no wheezes. He has no rales. He exhibits no tenderness.  Abdominal: Soft. Bowel sounds are normal. He exhibits no distension and no mass. There is no tenderness.  Musculoskeletal:  Right leg wrapped in dressing and Unna boot  Neurological: He is alert and oriented to person, place, and time.  Skin: Skin is warm and dry.    Lab Results  Component Value Date   HGBA1C 7.2 (A) 06/12/2018    CMP Latest Ref Rng & Units 11/25/2017 07/02/2017 02/26/2017  Glucose 65 - 99 mg/dL 153(H) 104(H) 100(H)  BUN 6 - 20 mg/dL 7 11 13   Creatinine 0.61 - 1.24 mg/dL 0.87 0.83 0.91  Sodium 135 - 145 mmol/L 134(L) 140 140  Potassium 3.5 - 5.1 mmol/L 3.9 4.3 3.9  Chloride 101 - 111 mmol/L 102 102 102  CO2 22 - 32 mmol/L 22 24 19(L)  Calcium 8.9 - 10.3 mg/dL 9.1 9.5 9.5  Total Protein 6.0 - 8.5 g/dL - 7.8 -  Total Bilirubin 0.0 - 1.2 mg/dL - 0.3 -  Alkaline Phos 39 - 117 IU/L - 55 -  AST 0 - 40 IU/L - 20 -  ALT 0 - 44 IU/L - 29 -    Lipid  Panel     Component Value Date/Time   CHOL 156 07/02/2017 1617   TRIG 66 07/02/2017 1617   HDL 48 07/02/2017 1617   CHOLHDL 3.3 07/02/2017 1617   CHOLHDL 3.2 07/21/2016 1051   VLDL 10 07/21/2016 1051   [place] 95 07/02/2017 1617     Assessment & Plan:   1. Type 2 diabetes mellitus without complication, with long-term current use of insulin (HCC) Controlled with A1c of 7.2 Continue current regimen, diabetic diet and lifestyle modifications - POCT glucose (manual entry) - glipiZIDE (GLUCOTROL) 10 MG tablet; Take 1 tablet (10 mg total) by mouth 2 (two) times daily before  a meal.  Dispense: 180 tablet; Refill: 1 - Ambulatory referral to Ophthalmology  2. Ulcer of right lower extremity, limited to breakdown of skin ([place]) Followed by wound care Unable to use Tylenol 3 due to itching and complaint tramadol does not work He has been advised he could alternate Tylenol extra strength with ibuprofen 800 mg He states he no longer needs pain management referral - ibuprofen (ADVIL,MOTRIN) 800 MG tablet; Take 1 tablet (800 mg total) by mouth every 12 (twelve) hours as needed.  Dispense: 30 tablet; Refill: 0  3. Essential hypertension Uncontrolled-has been out of carvedilol I have refilled his medications and he will follow-up with the clinical pharmacist at his next visit.  If blood pressure remains elevated will recommend switching lisinopril to lisinopril/HCTZ 20/12.5 mg take 2 tablets once daily - amLODipine (NORVASC) 10 MG tablet; Take 1 tablet (10 mg total) by mouth daily.  Dispense: 90 tablet; Refill: 1 - cloNIDine (CATAPRES) 0.3 MG tablet; Take 1 tablet (0.3 mg total) by mouth 2 (two) times daily.  Dispense: 180 tablet; Refill: 1 - lisinopril (PRINIVIL,ZESTRIL) 40 MG tablet; Take 1 tablet (40 mg total) by mouth daily.  Dispense: 90 tablet; Refill: 1 - CMP14+EGFR; Future - Lipid panel; Future - carvedilol (COREG) 12.5 MG tablet; Take 1 tablet (12.5 mg total) by mouth 2 (two) times daily  with a meal.  Dispense: 180 tablet; Refill: 1  4. Morbid obesity ([place]) He has lost 10 pounds since his last visit Continue working on lifestyle modifications, reducing caloric intake and exercising  5. Pure hypercholesterolemia Controlled Low-cholesterol diet - atorvastatin (LIPITOR) 40 MG tablet; Take 1 tablet (40 mg total) by mouth daily.  Dispense: 90 tablet; Refill: 1   Meds ordered this encounter  Medications  . DISCONTD: ibuprofen (ADVIL,MOTRIN) 800 MG tablet    Sig: Take 1 tablet (800 mg total) by mouth every 12 (twelve) hours as needed.    Dispense:  30 tablet    Refill:  0  . DISCONTD: carvedilol (COREG) 12.5 MG tablet    Sig: Take 1 tablet (12.5 mg total) by mouth 2 (two) times daily with a meal.    Dispense:  60 tablet    Refill:  3  . amLODipine (NORVASC) 10 MG tablet    Sig: Take 1 tablet (10 mg total) by mouth daily.    Dispense:  90 tablet    Refill:  1  . cloNIDine (CATAPRES) 0.3 MG tablet    Sig: Take 1 tablet (0.3 mg total) by mouth 2 (two) times daily.    Dispense:  180 tablet    Refill:  1  . glipiZIDE (GLUCOTROL) 10 MG tablet    Sig: Take 1 tablet (10 mg total) by mouth 2 (two) times daily before a meal.    Dispense:  180 tablet    Refill:  1  . ibuprofen (ADVIL,MOTRIN) 800 MG tablet    Sig: Take 1 tablet (800 mg total) by mouth every 12 (twelve) hours as needed.    Dispense:  30 tablet    Refill:  0  . lisinopril (PRINIVIL,ZESTRIL) 40 MG tablet    Sig: Take 1 tablet (40 mg total) by mouth daily.    Dispense:  90 tablet    Refill:  1  . carvedilol (COREG) 12.5 MG tablet    Sig: Take 1 tablet (12.5 mg total) by mouth 2 (two) times daily with a meal.    Dispense:  180 tablet    Refill:  1    Dispense  90-day supply  . atorvastatin (LIPITOR) 40 MG tablet    Sig: Take 1 tablet (40 mg total) by mouth daily.    Dispense:  90 tablet    Refill:  1    Follow-up: Return in about 2 weeks (around 07/09/2018) for Follow-up hypertension with Lurena Joiner; 28-month PCP for chronic medical conditions.   ECharlott RakesMD

## 2018-06-27 DIAGNOSIS — E11622 Type 2 diabetes mellitus with other skin ulcer: Secondary | ICD-10-CM | POA: Diagnosis not present

## 2018-06-27 DIAGNOSIS — I890 Lymphedema, not elsewhere classified: Secondary | ICD-10-CM | POA: Diagnosis not present

## 2018-06-27 DIAGNOSIS — I87331 Chronic venous hypertension (idiopathic) with ulcer and inflammation of right lower extremity: Secondary | ICD-10-CM | POA: Diagnosis not present

## 2018-06-27 DIAGNOSIS — L97212 Non-pressure chronic ulcer of right calf with fat layer exposed: Secondary | ICD-10-CM | POA: Diagnosis not present

## 2018-06-27 DIAGNOSIS — Z7984 Long term (current) use of oral hypoglycemic drugs: Secondary | ICD-10-CM | POA: Diagnosis not present

## 2018-06-27 DIAGNOSIS — I872 Venous insufficiency (chronic) (peripheral): Secondary | ICD-10-CM | POA: Diagnosis not present

## 2018-06-27 DIAGNOSIS — L97812 Non-pressure chronic ulcer of other part of right lower leg with fat layer exposed: Secondary | ICD-10-CM | POA: Diagnosis not present

## 2018-06-27 DIAGNOSIS — Z87891 Personal history of nicotine dependence: Secondary | ICD-10-CM | POA: Diagnosis not present

## 2018-07-03 ENCOUNTER — Telehealth: Payer: Self-pay | Admitting: Family Medicine

## 2018-07-03 DIAGNOSIS — L97812 Non-pressure chronic ulcer of other part of right lower leg with fat layer exposed: Secondary | ICD-10-CM | POA: Diagnosis not present

## 2018-07-03 DIAGNOSIS — L97212 Non-pressure chronic ulcer of right calf with fat layer exposed: Secondary | ICD-10-CM | POA: Diagnosis not present

## 2018-07-03 DIAGNOSIS — I890 Lymphedema, not elsewhere classified: Secondary | ICD-10-CM | POA: Diagnosis not present

## 2018-07-03 DIAGNOSIS — Z7984 Long term (current) use of oral hypoglycemic drugs: Secondary | ICD-10-CM | POA: Diagnosis not present

## 2018-07-03 DIAGNOSIS — I87331 Chronic venous hypertension (idiopathic) with ulcer and inflammation of right lower extremity: Secondary | ICD-10-CM | POA: Diagnosis not present

## 2018-07-03 DIAGNOSIS — Z87891 Personal history of nicotine dependence: Secondary | ICD-10-CM | POA: Diagnosis not present

## 2018-07-03 DIAGNOSIS — E11622 Type 2 diabetes mellitus with other skin ulcer: Secondary | ICD-10-CM | POA: Diagnosis not present

## 2018-07-03 NOTE — Telephone Encounter (Signed)
Patient called regarding his carvedilol dosage. Patient wanted to update that BP was high at his visit. Please follow up with patient.

## 2018-07-03 NOTE — Telephone Encounter (Signed)
Patient was called and states that his BP was high at his wound center visit. BP was 164/119.

## 2018-07-04 MED ORDER — LISINOPRIL-HYDROCHLOROTHIAZIDE 20-12.5 MG PO TABS: 2.0000 | ORAL_TABLET | Freq: Every day | ORAL | 1 refills | Status: DC

## 2018-07-04 NOTE — Telephone Encounter (Signed)
Patient was called and informed of medication change. Patient states that he does not wan to take the new medication due to it being mixed with a fluid pill.

## 2018-07-04 NOTE — Telephone Encounter (Signed)
I have switched his lisinopril to lisinopril/HCTZ and sent a new prescription to his pharmacy.

## 2018-07-05 NOTE — Telephone Encounter (Signed)
Patient was called and voicemail is currently full. 

## 2018-07-05 NOTE — Telephone Encounter (Signed)
All right then.  This will have to be addressed at an office visit.

## 2018-07-17 ENCOUNTER — Encounter (HOSPITAL_BASED_OUTPATIENT_CLINIC_OR_DEPARTMENT_OTHER): Payer: Medicare Other | Attending: Physician Assistant

## 2018-07-17 DIAGNOSIS — I890 Lymphedema, not elsewhere classified: Secondary | ICD-10-CM | POA: Diagnosis not present

## 2018-07-17 DIAGNOSIS — E11622 Type 2 diabetes mellitus with other skin ulcer: Secondary | ICD-10-CM | POA: Diagnosis not present

## 2018-07-17 DIAGNOSIS — Z7984 Long term (current) use of oral hypoglycemic drugs: Secondary | ICD-10-CM | POA: Insufficient documentation

## 2018-07-17 DIAGNOSIS — E119 Type 2 diabetes mellitus without complications: Secondary | ICD-10-CM | POA: Diagnosis not present

## 2018-07-17 DIAGNOSIS — Z87891 Personal history of nicotine dependence: Secondary | ICD-10-CM | POA: Insufficient documentation

## 2018-07-17 DIAGNOSIS — L97812 Non-pressure chronic ulcer of other part of right lower leg with fat layer exposed: Secondary | ICD-10-CM | POA: Insufficient documentation

## 2018-07-17 DIAGNOSIS — I87331 Chronic venous hypertension (idiopathic) with ulcer and inflammation of right lower extremity: Secondary | ICD-10-CM | POA: Diagnosis not present

## 2018-07-31 DIAGNOSIS — L97812 Non-pressure chronic ulcer of other part of right lower leg with fat layer exposed: Secondary | ICD-10-CM | POA: Diagnosis not present

## 2018-07-31 DIAGNOSIS — I87331 Chronic venous hypertension (idiopathic) with ulcer and inflammation of right lower extremity: Secondary | ICD-10-CM | POA: Diagnosis not present

## 2018-07-31 DIAGNOSIS — I890 Lymphedema, not elsewhere classified: Secondary | ICD-10-CM | POA: Diagnosis not present

## 2018-07-31 DIAGNOSIS — Z7984 Long term (current) use of oral hypoglycemic drugs: Secondary | ICD-10-CM | POA: Diagnosis not present

## 2018-07-31 DIAGNOSIS — Z87891 Personal history of nicotine dependence: Secondary | ICD-10-CM | POA: Diagnosis not present

## 2018-07-31 DIAGNOSIS — E119 Type 2 diabetes mellitus without complications: Secondary | ICD-10-CM | POA: Diagnosis not present

## 2018-07-31 DIAGNOSIS — E11622 Type 2 diabetes mellitus with other skin ulcer: Secondary | ICD-10-CM | POA: Diagnosis not present

## 2018-08-01 ENCOUNTER — Ambulatory Visit: Payer: Medicare Other | Attending: Family Medicine | Admitting: Pharmacist

## 2018-08-01 VITALS — BP 179/99 | HR 81

## 2018-08-01 DIAGNOSIS — Z87891 Personal history of nicotine dependence: Secondary | ICD-10-CM | POA: Diagnosis not present

## 2018-08-01 DIAGNOSIS — I10 Essential (primary) hypertension: Secondary | ICD-10-CM | POA: Insufficient documentation

## 2018-08-01 DIAGNOSIS — Z833 Family history of diabetes mellitus: Secondary | ICD-10-CM | POA: Insufficient documentation

## 2018-08-01 DIAGNOSIS — Z8249 Family history of ischemic heart disease and other diseases of the circulatory system: Secondary | ICD-10-CM | POA: Diagnosis not present

## 2018-08-01 DIAGNOSIS — Z823 Family history of stroke: Secondary | ICD-10-CM | POA: Diagnosis not present

## 2018-08-01 DIAGNOSIS — Z7901 Long term (current) use of anticoagulants: Secondary | ICD-10-CM | POA: Diagnosis not present

## 2018-08-01 DIAGNOSIS — L97911 Non-pressure chronic ulcer of unspecified part of right lower leg limited to breakdown of skin: Secondary | ICD-10-CM

## 2018-08-01 MED ORDER — IBUPROFEN 800 MG PO TABS: 800.0000 mg | ORAL_TABLET | Freq: Two times a day (BID) | ORAL | 0 refills | Status: AC | PRN

## 2018-08-01 NOTE — Patient Instructions (Signed)
Thank you for coming to see Anthony Barnes today.   Blood pressure today is elevated.  Please start taking the combination lisinopril-HCTZ. You take 2 tablets at the same time, daily.  Continue amlodipine, carvedilol, and clonidine.   Limiting salt and caffeine, as well as exercising as able for at least 30 minutes for 5 days out of the week, can also help you lower your blood pressure.  Take your blood pressure at home if you are able. Please write down these numbers and bring them to your visits.  If you have any questions about medications, please call me [phone].  [name]

## 2018-08-01 NOTE — Progress Notes (Signed)
   S:    PCP: Dr. Alvis LemmingsNewlin  Patient arrives in good spirits. Presents to the clinic for hypertension management. Patient was referred by Dr. Alvis LemmingsNewlin on 06/25/2018.  BP 179/98 at that visit. Since then, Dr. Alvis LemmingsNewlin has started Zestoretic 20-12.5, two tablets daily.   Patient denies adherence with medications. He is not taking the Zestoretic. Denies chest pain, headache, shortness of breath, or blurred vision. Denies BLE edema.   Current BP Medications include:  Amlodipine 10 mg daily, carvedilol 12.5 mg BID, clonidine 0.3 mg TID, lisinopril-HCTZ 20/12.5 mg 2 tablets daily.   Dietary habits include: reports that he limits salt, drinks 1 cup of coffee/day Exercise habits include: denies exercise d/t leg wound Family / Social history: HTN (mother), DM & stroke (father), former smoker (quit in 1992), occasionally drinks alcohol   Home BP readings: does not take at home  O:  L arm after 5 minutes rest: 179/99, HR 81  Last 3 Office BP readings: BP Readings from Last 3 Encounters:  06/25/18 (!) 179/98  06/12/18 (!) 161/93  06/03/18 (!) 175/100    BMET    Component Value Date/Time   NA 134 (L) 11/25/2017 0407   NA 140 07/02/2017 1617   K 3.9 11/25/2017 0407   CL 102 11/25/2017 0407   CO2 22 11/25/2017 0407   GLUCOSE 153 (H) 11/25/2017 0407   BUN 7 11/25/2017 0407   BUN 11 07/02/2017 1617   CREATININE 0.87 11/25/2017 0407   CREATININE 1.11 07/21/2016 1051   CALCIUM 9.1 11/25/2017 0407   GFRNONAA >60 11/25/2017 0407   GFRNONAA 82 11/06/2014 0916   GFRAA >60 11/25/2017 0407   GFRAA >89 11/06/2014 0916    Renal function: CrCl cannot be calculated (Patient's most recent lab result is older than the maximum 21 days allowed.).  Clinical ASCVD: No  The 10-year ASCVD risk score Denman George(Goff DC Jr., et al., 2013) is: 38.7%   Values used to calculate the score:     Age: 62 years     Sex: Male     Is Non-Hispanic African American: Yes     Diabetic: Yes     Tobacco smoker: No     Systolic  Blood Pressure: 179 mmHg     Is BP treated: Yes     HDL Cholesterol: 48 mg/dL     Total Cholesterol: 156 mg/dL   A/P: Hypertension longstanding currently uncontrolled on current medications. BP Goal <130/80 mmHg. Patient is not adherent with current medications. Encouraged compliance with Zestoretic. Pt to pick-up prescriptions after leaving today.  -Continued amlodipine, carvedilol, and clonidine.  -Pt to start Zestoretic.  -Will monitor CMP14+GFR -Counseled on lifestyle modifications for blood pressure control including reduced dietary sodium, increased exercise, adequate sleep  Results reviewed and written information provided.  Total time in face-to-face counseling 30 minutes.   F/U Clinic Visit 09/30/2018.  Butch PennyLuke Van Ausdall, PharmD, CPP Clinical Pharmacist [facility] [phone]

## 2018-08-02 ENCOUNTER — Encounter: Payer: Self-pay | Admitting: Pharmacist

## 2018-08-21 ENCOUNTER — Encounter (HOSPITAL_BASED_OUTPATIENT_CLINIC_OR_DEPARTMENT_OTHER): Payer: Medicare Other | Attending: Physician Assistant

## 2018-08-21 DIAGNOSIS — Z87891 Personal history of nicotine dependence: Secondary | ICD-10-CM | POA: Insufficient documentation

## 2018-08-21 DIAGNOSIS — I87331 Chronic venous hypertension (idiopathic) with ulcer and inflammation of right lower extremity: Secondary | ICD-10-CM | POA: Insufficient documentation

## 2018-08-21 DIAGNOSIS — I890 Lymphedema, not elsewhere classified: Secondary | ICD-10-CM | POA: Insufficient documentation

## 2018-08-21 DIAGNOSIS — L97812 Non-pressure chronic ulcer of other part of right lower leg with fat layer exposed: Secondary | ICD-10-CM | POA: Insufficient documentation

## 2018-08-28 DIAGNOSIS — I87331 Chronic venous hypertension (idiopathic) with ulcer and inflammation of right lower extremity: Secondary | ICD-10-CM | POA: Diagnosis not present

## 2018-08-28 DIAGNOSIS — L97812 Non-pressure chronic ulcer of other part of right lower leg with fat layer exposed: Secondary | ICD-10-CM | POA: Diagnosis not present

## 2018-08-28 DIAGNOSIS — E11622 Type 2 diabetes mellitus with other skin ulcer: Secondary | ICD-10-CM | POA: Diagnosis not present

## 2018-08-28 DIAGNOSIS — I890 Lymphedema, not elsewhere classified: Secondary | ICD-10-CM | POA: Diagnosis not present

## 2018-08-28 DIAGNOSIS — Z87891 Personal history of nicotine dependence: Secondary | ICD-10-CM | POA: Diagnosis not present

## 2018-09-04 DIAGNOSIS — I87331 Chronic venous hypertension (idiopathic) with ulcer and inflammation of right lower extremity: Secondary | ICD-10-CM | POA: Diagnosis not present

## 2018-09-04 DIAGNOSIS — E11622 Type 2 diabetes mellitus with other skin ulcer: Secondary | ICD-10-CM | POA: Diagnosis not present

## 2018-09-04 DIAGNOSIS — Z87891 Personal history of nicotine dependence: Secondary | ICD-10-CM | POA: Diagnosis not present

## 2018-09-04 DIAGNOSIS — I890 Lymphedema, not elsewhere classified: Secondary | ICD-10-CM | POA: Diagnosis not present

## 2018-09-04 DIAGNOSIS — L97812 Non-pressure chronic ulcer of other part of right lower leg with fat layer exposed: Secondary | ICD-10-CM | POA: Diagnosis not present

## 2018-09-18 ENCOUNTER — Encounter (HOSPITAL_BASED_OUTPATIENT_CLINIC_OR_DEPARTMENT_OTHER): Payer: Medicare Other | Attending: Internal Medicine

## 2018-09-18 DIAGNOSIS — L97812 Non-pressure chronic ulcer of other part of right lower leg with fat layer exposed: Secondary | ICD-10-CM | POA: Insufficient documentation

## 2018-09-18 DIAGNOSIS — Z87891 Personal history of nicotine dependence: Secondary | ICD-10-CM | POA: Insufficient documentation

## 2018-09-18 DIAGNOSIS — I87331 Chronic venous hypertension (idiopathic) with ulcer and inflammation of right lower extremity: Secondary | ICD-10-CM | POA: Diagnosis not present

## 2018-09-18 DIAGNOSIS — I10 Essential (primary) hypertension: Secondary | ICD-10-CM | POA: Insufficient documentation

## 2018-09-18 DIAGNOSIS — E11622 Type 2 diabetes mellitus with other skin ulcer: Secondary | ICD-10-CM | POA: Diagnosis not present

## 2018-09-30 ENCOUNTER — Ambulatory Visit: Payer: Medicare Other | Admitting: Family Medicine

## 2018-10-02 DIAGNOSIS — Z87891 Personal history of nicotine dependence: Secondary | ICD-10-CM | POA: Diagnosis not present

## 2018-10-02 DIAGNOSIS — I87331 Chronic venous hypertension (idiopathic) with ulcer and inflammation of right lower extremity: Secondary | ICD-10-CM | POA: Diagnosis not present

## 2018-10-02 DIAGNOSIS — L97812 Non-pressure chronic ulcer of other part of right lower leg with fat layer exposed: Secondary | ICD-10-CM | POA: Diagnosis not present

## 2018-10-02 DIAGNOSIS — I10 Essential (primary) hypertension: Secondary | ICD-10-CM | POA: Diagnosis not present

## 2018-10-16 ENCOUNTER — Encounter (HOSPITAL_BASED_OUTPATIENT_CLINIC_OR_DEPARTMENT_OTHER): Payer: Medicare Other | Attending: Internal Medicine

## 2018-10-16 DIAGNOSIS — I890 Lymphedema, not elsewhere classified: Secondary | ICD-10-CM | POA: Diagnosis not present

## 2018-10-16 DIAGNOSIS — L97812 Non-pressure chronic ulcer of other part of right lower leg with fat layer exposed: Secondary | ICD-10-CM | POA: Diagnosis not present

## 2018-10-16 DIAGNOSIS — I87331 Chronic venous hypertension (idiopathic) with ulcer and inflammation of right lower extremity: Secondary | ICD-10-CM | POA: Insufficient documentation

## 2018-10-16 LAB — GLUCOSE, CAPILLARY: GLUCOSE-CAPILLARY: 149 mg/dL — AB (ref 70–99)

## 2018-10-21 ENCOUNTER — Ambulatory Visit (HOSPITAL_BASED_OUTPATIENT_CLINIC_OR_DEPARTMENT_OTHER): Payer: Medicare Other | Admitting: Pharmacist

## 2018-10-21 ENCOUNTER — Encounter: Payer: Self-pay | Admitting: Pharmacist

## 2018-10-21 ENCOUNTER — Encounter: Payer: Self-pay | Admitting: Family Medicine

## 2018-10-21 ENCOUNTER — Ambulatory Visit: Payer: Medicare Other | Attending: Family Medicine | Admitting: Family Medicine

## 2018-10-21 VITALS — BP 181/84 | HR 77 | Temp 98.0°F | Ht 76.0 in | Wt 327.2 lb

## 2018-10-21 DIAGNOSIS — I10 Essential (primary) hypertension: Secondary | ICD-10-CM | POA: Insufficient documentation

## 2018-10-21 DIAGNOSIS — Z79899 Other long term (current) drug therapy: Secondary | ICD-10-CM | POA: Diagnosis not present

## 2018-10-21 DIAGNOSIS — Z8041 Family history of malignant neoplasm of ovary: Secondary | ICD-10-CM | POA: Diagnosis not present

## 2018-10-21 DIAGNOSIS — Z91013 Allergy to seafood: Secondary | ICD-10-CM | POA: Insufficient documentation

## 2018-10-21 DIAGNOSIS — Z833 Family history of diabetes mellitus: Secondary | ICD-10-CM | POA: Insufficient documentation

## 2018-10-21 DIAGNOSIS — Z6839 Body mass index (BMI) 39.0-39.9, adult: Secondary | ICD-10-CM | POA: Diagnosis not present

## 2018-10-21 DIAGNOSIS — Z8249 Family history of ischemic heart disease and other diseases of the circulatory system: Secondary | ICD-10-CM | POA: Insufficient documentation

## 2018-10-21 DIAGNOSIS — Z7984 Long term (current) use of oral hypoglycemic drugs: Secondary | ICD-10-CM | POA: Diagnosis not present

## 2018-10-21 DIAGNOSIS — E119 Type 2 diabetes mellitus without complications: Secondary | ICD-10-CM | POA: Insufficient documentation

## 2018-10-21 DIAGNOSIS — E7800 Pure hypercholesterolemia, unspecified: Secondary | ICD-10-CM | POA: Diagnosis not present

## 2018-10-21 DIAGNOSIS — Z885 Allergy status to narcotic agent status: Secondary | ICD-10-CM | POA: Diagnosis not present

## 2018-10-21 LAB — POCT GLYCOSYLATED HEMOGLOBIN (HGB A1C): HBA1C, POC (CONTROLLED DIABETIC RANGE): 8.2 % — AB (ref 0.0–7.0)

## 2018-10-21 LAB — GLUCOSE, POCT (MANUAL RESULT ENTRY): POC GLUCOSE: 161 mg/dL — AB (ref 70–99)

## 2018-10-21 MED ORDER — ATORVASTATIN CALCIUM 40 MG PO TABS: 40.0000 mg | ORAL_TABLET | Freq: Every day | ORAL | 1 refills | Status: DC

## 2018-10-21 MED ORDER — LISINOPRIL-HYDROCHLOROTHIAZIDE 20-12.5 MG PO TABS: 2.0000 | ORAL_TABLET | Freq: Every day | ORAL | 1 refills | Status: DC

## 2018-10-21 MED ORDER — CARVEDILOL 12.5 MG PO TABS: 12.5000 mg | ORAL_TABLET | Freq: Two times a day (BID) | ORAL | 1 refills | Status: DC

## 2018-10-21 MED ORDER — AMLODIPINE BESYLATE 10 MG PO TABS: 10.0000 mg | ORAL_TABLET | Freq: Every day | ORAL | 1 refills | Status: DC

## 2018-10-21 MED ORDER — SPIRONOLACTONE 25 MG PO TABS: 25.0000 mg | ORAL_TABLET | Freq: Every day | ORAL | 3 refills | Status: DC

## 2018-10-21 MED ORDER — CLONIDINE HCL 0.3 MG PO TABS: 0.3000 mg | ORAL_TABLET | Freq: Two times a day (BID) | ORAL | 1 refills | Status: DC

## 2018-10-21 MED ORDER — LIRAGLUTIDE 18 MG/3ML ~~LOC~~ SOPN: PEN_INJECTOR | SUBCUTANEOUS | 3 refills | Status: DC

## 2018-10-21 MED ORDER — GLIPIZIDE 10 MG PO TABS: 10.0000 mg | ORAL_TABLET | Freq: Two times a day (BID) | ORAL | 1 refills | Status: DC

## 2018-10-21 NOTE — Progress Notes (Signed)
Subjective:  Patient ID: Anthony Barnes, male    DOB: 09/05/1955  Age: 64 y.o. MRN: [id number]  CC: Diabetes   HPI Anthony Barnes  is a 63 year old male with a history of hypertension, type 2 diabetes mellitus A1c 8.2, morbid obesity here for follow-up visit. His A1c is 8.2 which has trended up from 7.2 previously and he denies any change in his diet, lifestyle.  Of note he has gained 10 pounds since his last visit 3 months ago. He does not exercise regularly and his right leg ulcer which he states is now healing has contributed to this. Also is followed by the wound care clinic where he attends every 2 weeks and in between that wraps his right leg. Denies neuropathy, hypoglycemia, visual concerns. His blood pressure is severely elevated and he endorses compliance with all his medications.  Lisinopril was switched to lisinopril/HCTZ at his last visit with clinical pharmacist. Denies chest pain, dyspnea.  Past Medical History:  Diagnosis Date  . Diabetes mellitus without complication ([place])   . Hypertension     Past Surgical History:  Procedure Laterality Date  . BRAIN SURGERY    . ESOPHAGOGASTRODUODENOSCOPY N/A 03/22/2013   Procedure: ESOPHAGOGASTRODUODENOSCOPY (EGD);  Surgeon: Wonda Horner, MD;  Location: [facility] ENDOSCOPY;  Service: Endoscopy;  Laterality: N/A;    Family History  Problem Relation Age of Onset  . Hypertension Mother   . Cancer Mother 73       ovarian  . Diabetes Father   . Stroke Father     Allergies  Allergen Reactions  . Shellfish Allergy Anaphylaxis  . Codeine Itching    unknown    Outpatient Medications Prior to Visit  Medication Sig Dispense Refill  . acetaminophen-codeine (TYLENOL #3) 300-30 MG tablet Take 1 tablet by mouth every 8 (eight) hours as needed for moderate pain. Take with zyrtec 63m daily 30 tablet 0  . Blood Glucose Monitoring Suppl (ACCU-CHEK AVIVA) device Use as instructed 3 times daily before meals. 1 each 0  . glucose blood  (ACCU-CHEK AVIVA) test strip Use 3 times daily before meals 100 each 12  . ibuprofen (ADVIL,MOTRIN) 800 MG tablet Take 1 tablet (800 mg total) by mouth every 12 (twelve) hours as needed. 30 tablet 0  . Lancets (ACCU-CHEK MULTICLIX) lancets Use as instructed 3 times daily before meals 100 each 12  . Multiple Vitamin (MULTIVITAMIN WITH MINERALS) TABS tablet Take 1 tablet by mouth daily.    .Marland Kitchenomega-3 acid ethyl esters (LOVAZA) 1 g capsule Take 1 g by mouth 2 (two) times daily.    .Marland KitchenPOTASSIUM PO Take 1-2 tablets by mouth daily.    .Marland KitchenamLODipine (NORVASC) 10 MG tablet Take 1 tablet (10 mg total) by mouth daily. 90 tablet 1  . atorvastatin (LIPITOR) 40 MG tablet Take 1 tablet (40 mg total) by mouth daily. 90 tablet 1  . carvedilol (COREG) 12.5 MG tablet Take 1 tablet (12.5 mg total) by mouth 2 (two) times daily with a meal. 180 tablet 1  . cloNIDine (CATAPRES) 0.3 MG tablet Take 1 tablet (0.3 mg total) by mouth 2 (two) times daily. 180 tablet 1  . glipiZIDE (GLUCOTROL) 10 MG tablet Take 1 tablet (10 mg total) by mouth 2 (two) times daily before a meal. 180 tablet 1  . lisinopril-hydrochlorothiazide (ZESTORETIC) 20-12.5 MG tablet Take 2 tablets by mouth daily. 180 tablet 1   No facility-administered medications prior to visit.      ROS Review of Systems General: negative  for fever, weight loss, appetite change Eyes: no visual symptoms. ENT: no ear symptoms, no sinus tenderness, no nasal congestion or sore throat. Neck: no pain  Respiratory: no wheezing, shortness of breath, cough Cardiovascular: no chest pain, no dyspnea on exertion, no pedal edema, no orthopnea. Gastrointestinal: no abdominal pain, no diarrhea, no constipation Genito-Urinary: no urinary frequency, no dysuria, no polyuria. Hematologic: no bruising Endocrine: no cold or heat intolerance Neurological: no headaches, no seizures, no tremors Musculoskeletal: no joint pains, no joint swelling Skin: +wound Psychological: no  depression, no anxiety,    Objective:  BP (!) 181/84   Pulse 77   Temp 98 F (36.7 C) (Oral)   Ht _0  (1.93 m)   Wt (!) 327 lb 3.2 oz (148.4 kg)   SpO2 97%   BMI 39.83 kg/m   BP/Weight 10/21/2018 08/01/2018 13/24/4010  Systolic BP 272 536 644  Diastolic BP 84 99 98  Wt. (Lbs) 327.2 - 317.2  BMI 39.83 - 38.61      Physical Exam Constitutional: normal appearing,  Eyes: PERRLA HEENT: Head is atraumatic, normal sinuses, normal oropharynx, normal appearing tonsils and palate, tympanic membrane is normal bilaterally. Neck: normal range of motion, no thyromegaly, no JVD Cardiovascular: normal rate and rhythm, normal heart sounds, no murmurs, rub or gallop, no pedal edema Respiratory: Normal breath sounds, clear to auscultation bilaterally, no wheezes, no rales, no rhonchi Abdomen: soft, not tender to palpation, normal bowel sounds, no enlarged organs Musculoskeletal: Full ROM, no tenderness in joints Skin: Right leg wrapped in Ace wrap Neurological: alert, oriented x3, cranial nerves I-XII grossly intact , normal motor strength, normal sensation. Psychological: normal mood.   CMP Latest Ref Rng & Units 11/25/2017 07/02/2017 02/26/2017  Glucose 65 - 99 mg/dL 153(H) 104(H) 100(H)  BUN 6 - 20 mg/dL _1 Creatinine 0.61 - 1.24 mg/dL 0.87 0.83 0.91  Sodium 135 - 145 mmol/L 134(L) 140 140  Potassium 3.5 - 5.1 mmol/L 3.9 4.3 3.9  Chloride 101 - 111 mmol/L 102 102 102  CO2 22 - 32 mmol/L 22 24 19(L)  Calcium 8.9 - 10.3 mg/dL 9.1 9.5 9.5  Total Protein 6.0 - 8.5 g/dL - 7.8 -  Total Bilirubin 0.0 - 1.2 mg/dL - 0.3 -  Alkaline Phos 39 - 117 IU/L - 55 -  AST 0 - 40 IU/L - 20 -  ALT 0 - 44 IU/L - 29 -    Lipid Panel     Component Value Date/Time   CHOL 156 07/02/2017 1617   TRIG 66 07/02/2017 1617   HDL 48 07/02/2017 1617   CHOLHDL 3.3 07/02/2017 1617   CHOLHDL 3.2 07/21/2016 1051   VLDL 10 07/21/2016 1051   LDLCALC 95 07/02/2017 1617    CBC    Component Value  Date/Time   WBC 8.1 11/25/2017 0407   RBC 4.82 11/25/2017 0407   HGB 13.5 11/25/2017 0407   HCT 39.1 11/25/2017 0407   PLT 211 11/25/2017 0407   MCV 81.1 11/25/2017 0407   MCH 28.0 11/25/2017 0407   MCHC 34.5 11/25/2017 0407   RDW 15.4 11/25/2017 0407   LYMPHSABS 1.8 10/20/2013 1250   MONOABS 0.7 10/20/2013 1250   EOSABS 0.1 10/20/2013 1250   BASOSABS 0.0 10/20/2013 1250    Lab Results  Component Value Date   HGBA1C 8.2 (A) 10/21/2018    Assessment & Plan:   1. Type 2 diabetes mellitus without complication, without long-term current use of insulin (HCC) Uncontrolled with A1c of 8.2 Victoza  added to regimen Clinical pharmacist called in for education Counseled on Diabetic diet, my plate method, 734 minutes of moderate intensity exercise/week Keep blood sugar logs with fasting goals of 80-120 mg/dl, random of less than 180 and in the event of sugars less than 60 mg/dl or greater than 400 mg/dl please notify the clinic ASAP. It is recommended that you undergo annual eye exams and annual foot exams. Pneumonia vaccine is recommended. - POCT glucose (manual entry) - POCT glycosylated hemoglobin (Hb A1C) - CMP14+EGFR - liraglutide (VICTOZA) 18 MG/3ML SOPN; Inject subcutaneously daily 0.6 mg for 1 week then 1.2 mg for 1 week then 1.8 mg thereafter.  Dispense: 30 mL; Refill: 3 - Microalbumin/Creatinine Ratio, Urine - Lipid panel; Future - glipiZIDE (GLUCOTROL) 10 MG tablet; Take 1 tablet (10 mg total) by mouth 2 (two) times daily before a meal.  Dispense: 180 tablet; Refill: 1  2. Essential hypertension Uncontrolled We will evaluate for resistant hypertension with aldosterone level Spironolactone added to regimen - spironolactone (ALDACTONE) 25 MG tablet; Take 1 tablet (25 mg total) by mouth daily.  Dispense: 30 tablet; Refill: 3 - Aldosterone + renin activity w/o ratio - amLODipine (NORVASC) 10 MG tablet; Take 1 tablet (10 mg total) by mouth daily.  Dispense: 90 tablet; Refill:  1 - carvedilol (COREG) 12.5 MG tablet; Take 1 tablet (12.5 mg total) by mouth 2 (two) times daily with a meal.  Dispense: 180 tablet; Refill: 1 - cloNIDine (CATAPRES) 0.3 MG tablet; Take 1 tablet (0.3 mg total) by mouth 2 (two) times daily.  Dispense: 180 tablet; Refill: 1 - lisinopril-hydrochlorothiazide (ZESTORETIC) 20-12.5 MG tablet; Take 2 tablets by mouth daily.  Dispense: 180 tablet; Refill: 1  3. Pure hypercholesterolemia Controlled Low-cholesterol diet - atorvastatin (LIPITOR) 40 MG tablet; Take 1 tablet (40 mg total) by mouth daily.  Dispense: 90 tablet; Refill: 1  4. Morbid obesity ([place]) He has not been as active due to his right leg ulcer Gained 10 pounds since his last visit Advised to increase physical activity-start by walking 10 minutes every day   Meds ordered this encounter  Medications  . spironolactone (ALDACTONE) 25 MG tablet    Sig: Take 1 tablet (25 mg total) by mouth daily.    Dispense:  30 tablet    Refill:  3  . liraglutide (VICTOZA) 18 MG/3ML SOPN    Sig: Inject subcutaneously daily 0.6 mg for 1 week then 1.2 mg for 1 week then 1.8 mg thereafter.    Dispense:  30 mL    Refill:  3  . amLODipine (NORVASC) 10 MG tablet    Sig: Take 1 tablet (10 mg total) by mouth daily.    Dispense:  90 tablet    Refill:  1  . atorvastatin (LIPITOR) 40 MG tablet    Sig: Take 1 tablet (40 mg total) by mouth daily.    Dispense:  90 tablet    Refill:  1  . carvedilol (COREG) 12.5 MG tablet    Sig: Take 1 tablet (12.5 mg total) by mouth 2 (two) times daily with a meal.    Dispense:  180 tablet    Refill:  1    Dispense 90-day supply  . cloNIDine (CATAPRES) 0.3 MG tablet    Sig: Take 1 tablet (0.3 mg total) by mouth 2 (two) times daily.    Dispense:  180 tablet    Refill:  1  . glipiZIDE (GLUCOTROL) 10 MG tablet    Sig: Take 1 tablet (10 mg total) by  mouth 2 (two) times daily before a meal.    Dispense:  180 tablet    Refill:  1  . lisinopril-hydrochlorothiazide  (ZESTORETIC) 20-12.5 MG tablet    Sig: Take 2 tablets by mouth daily.    Dispense:  180 tablet    Refill:  1    Follow-up: Return in about 1 month (around 11/21/2018) for follow up on Hypertension.       Charlott Rakes, MD, FAAFP. [facility] [place], [place]   10/21/2018, 4:48 PM

## 2018-10-21 NOTE — Patient Instructions (Signed)

## 2018-10-21 NOTE — Progress Notes (Signed)
Patient was educated on the use of the Victoza pen. Reviewed necessary supplies and operation of the pen. Also reviewed goal blood glucose levels. Patient was able to demonstrate use. All questions and concerns were addressed.  

## 2018-10-26 LAB — CMP14+EGFR
A/G RATIO: 1.4 (ref 1.2–2.2)
ALBUMIN: 4.7 g/dL (ref 3.8–4.8)
ALT: 31 IU/L (ref 0–44)
AST: 17 IU/L (ref 0–40)
Alkaline Phosphatase: 55 IU/L (ref 39–117)
BILIRUBIN TOTAL: 0.3 mg/dL (ref 0.0–1.2)
BUN / CREAT RATIO: 16 (ref 10–24)
BUN: 14 mg/dL (ref 8–27)
CALCIUM: 9.9 mg/dL (ref 8.6–10.2)
CHLORIDE: 98 mmol/L (ref 96–106)
CO2: 24 mmol/L (ref 20–29)
Creatinine, Ser: 0.89 mg/dL (ref 0.76–1.27)
GFR, EST AFRICAN AMERICAN: 107 mL/min/{1.73_m2} (ref >59)
GFR, EST NON AFRICAN AMERICAN: 92 mL/min/{1.73_m2} (ref >59)
GLOBULIN, TOTAL: 3.4 g/dL (ref 1.5–4.5)
Glucose: 138 mg/dL — ABNORMAL HIGH (ref 65–99)
POTASSIUM: 3.9 mmol/L (ref 3.5–5.2)
SODIUM: 136 mmol/L (ref 134–144)
TOTAL PROTEIN: 8.1 g/dL (ref 6.0–8.5)

## 2018-10-26 LAB — ALDOSTERONE + RENIN ACTIVITY W/O RATIO
ALDOSTERONE: 6.5 ng/dL (ref 0.0–30.0)
Renin: 1.425 ng/mL/hr (ref 0.167–5.380)

## 2018-10-30 DIAGNOSIS — E11622 Type 2 diabetes mellitus with other skin ulcer: Secondary | ICD-10-CM | POA: Diagnosis not present

## 2018-10-30 DIAGNOSIS — I87331 Chronic venous hypertension (idiopathic) with ulcer and inflammation of right lower extremity: Secondary | ICD-10-CM | POA: Diagnosis not present

## 2018-10-30 DIAGNOSIS — L97812 Non-pressure chronic ulcer of other part of right lower leg with fat layer exposed: Secondary | ICD-10-CM | POA: Diagnosis not present

## 2018-10-30 DIAGNOSIS — I890 Lymphedema, not elsewhere classified: Secondary | ICD-10-CM | POA: Diagnosis not present

## 2018-10-31 ENCOUNTER — Telehealth: Payer: Self-pay

## 2018-10-31 NOTE — Telephone Encounter (Signed)
Patient was called and voicemail is currently full and can not accept any new messages. 

## 2018-10-31 NOTE — Telephone Encounter (Signed)
-----   Message from Hoy Register, MD sent at 10/28/2018 11:17 AM EDT ----- Please inform the patient that labs are normal. Thank you.

## 2018-11-01 NOTE — Telephone Encounter (Signed)
Patient was called and voicemail is full.

## 2018-12-23 ENCOUNTER — Encounter (HOSPITAL_BASED_OUTPATIENT_CLINIC_OR_DEPARTMENT_OTHER): Payer: Medicare Other | Attending: Internal Medicine

## 2018-12-23 DIAGNOSIS — Z87891 Personal history of nicotine dependence: Secondary | ICD-10-CM | POA: Insufficient documentation

## 2018-12-23 DIAGNOSIS — L97211 Non-pressure chronic ulcer of right calf limited to breakdown of skin: Secondary | ICD-10-CM | POA: Insufficient documentation

## 2018-12-23 DIAGNOSIS — I872 Venous insufficiency (chronic) (peripheral): Secondary | ICD-10-CM | POA: Insufficient documentation

## 2018-12-30 DIAGNOSIS — L97211 Non-pressure chronic ulcer of right calf limited to breakdown of skin: Secondary | ICD-10-CM | POA: Diagnosis not present

## 2018-12-30 DIAGNOSIS — Z87891 Personal history of nicotine dependence: Secondary | ICD-10-CM | POA: Diagnosis not present

## 2018-12-30 DIAGNOSIS — R234 Changes in skin texture: Secondary | ICD-10-CM | POA: Diagnosis not present

## 2018-12-30 DIAGNOSIS — I872 Venous insufficiency (chronic) (peripheral): Secondary | ICD-10-CM | POA: Diagnosis not present

## 2018-12-30 DIAGNOSIS — L97819 Non-pressure chronic ulcer of other part of right lower leg with unspecified severity: Secondary | ICD-10-CM | POA: Diagnosis not present

## 2019-02-04 ENCOUNTER — Other Ambulatory Visit: Payer: Self-pay

## 2019-02-04 ENCOUNTER — Encounter (HOSPITAL_COMMUNITY): Payer: Self-pay | Admitting: Emergency Medicine

## 2019-02-04 ENCOUNTER — Ambulatory Visit (HOSPITAL_COMMUNITY)
Admission: EM | Admit: 2019-02-04 | Discharge: 2019-02-04 | Disposition: A | Discharge status: Home / Self Care | Payer: Medicare Other | Attending: Family Medicine | Admitting: Family Medicine

## 2019-02-04 DIAGNOSIS — L03011 Cellulitis of right finger: Secondary | ICD-10-CM

## 2019-02-04 MED ORDER — CEPHALEXIN 500 MG PO CAPS: 500.0000 mg | ORAL_CAPSULE | Freq: Two times a day (BID) | ORAL | 0 refills | Status: DC

## 2019-02-04 NOTE — ED Triage Notes (Signed)
Pt sts right thumb pain and swelling after pinching in lawn mower lever 2 weeks ago

## 2019-02-04 NOTE — Discharge Instructions (Signed)
Soak the thumb in warm water 2 or 3 times a day Take Keflex 2 times a day for 5 days Take 2 doses today Call or return if any problems develop

## 2019-02-04 NOTE — ED Provider Notes (Signed)
[facility]    CSN: [id number] Arrival date & time: 02/04/19  1009      History   Chief Complaint Chief Complaint  Patient presents with  . Hand Pain    HPI Anthony MassyCharles E Barnes is a 63 y.o. male.   HPI  Right thumb pain.  Gradually worsening over the last several days.  He is noticed some swelling around the edge of his nail.  Yellow discoloration.  He is diabetic.  No fever.  Pain is becoming significant, to keep him awake at night last night.  Blood sugars have been normal.  States he is otherwise healthy  Past Medical History:  Diagnosis Date  . Diabetes mellitus without complication (HCC)   . Hypertension     Patient Active Problem List   Diagnosis Date Noted  . Hyperlipidemia 06/25/2018  . Morbid obesity (HCC) 03/13/2016  . Dental caries 09/09/2015  . Diabetes (HCC) 10/20/2013  . DM (diabetes mellitus) type 2, uncontrolled, with ketoacidosis (HCC) 04/01/2013  . Essential hypertension, benign 04/01/2013  . GERD (gastroesophageal reflux disease) 04/01/2013  . Erosive gastritis 03/22/2013  . DM2 (diabetes mellitus, type 2) (HCC) 03/21/2013  . Unspecified essential hypertension 03/21/2013  . Hyponatremia 03/21/2013  . Epigastric pain 03/20/2013  . HTN (hypertension) 03/20/2013    Past Surgical History:  Procedure Laterality Date  . BRAIN SURGERY    . ESOPHAGOGASTRODUODENOSCOPY N/A 03/22/2013   Procedure: ESOPHAGOGASTRODUODENOSCOPY (EGD);  Surgeon: Graylin ShiverSalem F Ganem, MD;  [facility];  Service: Endoscopy;  Laterality: N/A;       Home Medications    Prior to Admission medications   Medication Sig Start Date End Date Taking? Authorizing Provider  acetaminophen-codeine (TYLENOL #3) 300-30 MG tablet Take 1 tablet by mouth every 8 (eight) hours as needed for moderate pain. Take with zyrtec 10mg  daily 06/12/18   Anders SimmondsMcClung, Angela M, PA-C  amLODipine (NORVASC) 10 MG tablet Take 1 tablet (10 mg total) by mouth daily. 10/21/18   Hoy RegisterNewlin, Enobong, MD   atorvastatin (LIPITOR) 40 MG tablet Take 1 tablet (40 mg total) by mouth daily. 10/21/18   Hoy RegisterNewlin, Enobong, MD  Blood Glucose Monitoring Suppl (ACCU-CHEK AVIVA) device Use as instructed 3 times daily before meals. 01/16/18   Hoy RegisterNewlin, Enobong, MD  carvedilol (COREG) 12.5 MG tablet Take 1 tablet (12.5 mg total) by mouth 2 (two) times daily with a meal. 10/21/18   Hoy RegisterNewlin, Enobong, MD  cephALEXin (KEFLEX) 500 MG capsule Take 1 capsule (500 mg total) by mouth 2 (two) times daily. 02/04/19   Eustace MooreNelson, Yvonne Sue, MD  cloNIDine (CATAPRES) 0.3 MG tablet Take 1 tablet (0.3 mg total) by mouth 2 (two) times daily. 10/21/18   Hoy RegisterNewlin, Enobong, MD  glipiZIDE (GLUCOTROL) 10 MG tablet Take 1 tablet (10 mg total) by mouth 2 (two) times daily before a meal. 10/21/18   Hoy RegisterNewlin, Enobong, MD  glucose blood (ACCU-CHEK AVIVA) test strip Use 3 times daily before meals 06/12/18   Georgian CoMcClung, Angela M, PA-C  ibuprofen (ADVIL,MOTRIN) 800 MG tablet Take 1 tablet (800 mg total) by mouth every 12 (twelve) hours as needed. 08/01/18   Hoy RegisterNewlin, Enobong, MD  Lancets (ACCU-CHEK MULTICLIX) lancets Use as instructed 3 times daily before meals 01/16/18   Hoy RegisterNewlin, Enobong, MD  liraglutide (VICTOZA) 18 MG/3ML SOPN Inject subcutaneously daily 0.6 mg for 1 week then 1.2 mg for 1 week then 1.8 mg thereafter. 10/21/18   Hoy RegisterNewlin, Enobong, MD  lisinopril-hydrochlorothiazide (ZESTORETIC) 20-12.5 MG tablet Take 2 tablets by mouth daily. 10/21/18   Newlin, Odette HornsEnobong,  MD  Multiple Vitamin (MULTIVITAMIN WITH MINERALS) TABS tablet Take 1 tablet by mouth daily.    [provider]  omega-3 acid ethyl esters (LOVAZA) 1 g capsule Take 1 g by mouth 2 (two) times daily.    [provider]  POTASSIUM PO Take 1-2 tablets by mouth daily.    [provider]  spironolactone (ALDACTONE) 25 MG tablet Take 1 tablet (25 mg total) by mouth daily. 10/21/18   Charlott Rakes, MD    Family History Family History  Problem Relation Age of Onset  . Hypertension Mother    . Cancer Mother 37       ovarian  . Diabetes Father   . Stroke Father     Social History Social History   Tobacco Use  . Smoking status: Former Smoker    Packs/day: 1.00    Years: 16.00    Pack years: 16.00    Types: Cigarettes    Start date: 08/14/1974    Quit date: 08/14/1990    Years since quitting: 28.4  . Smokeless tobacco: Former Systems developer    Quit date: 08/14/1990  Substance Use Topics  . Alcohol use: Yes    Alcohol/week: 4.0 standard drinks    Types: 2 Cans of beer, 2 Shots of liquor per week    Comment: occas  . Drug use: No     Allergies   Shellfish allergy and Codeine   Review of Systems Review of Systems  Constitutional: Negative for chills and fever.  HENT: Negative for ear pain and sore throat.   Eyes: Negative for pain and visual disturbance.  Respiratory: Negative for cough and shortness of breath.   Cardiovascular: Negative for chest pain and palpitations.  Gastrointestinal: Negative for abdominal pain and vomiting.  Genitourinary: Negative for dysuria and hematuria.  Musculoskeletal: Negative for arthralgias and back pain.  Skin: Positive for wound. Negative for color change and rash.  Neurological: Negative for seizures and syncope.  All other systems reviewed and are negative.    Physical Exam Triage Vital Signs ED Triage Vitals  Enc Vitals Group     BP 02/04/19 1032 (!) 139/99     Pulse Rate 02/04/19 1032 79     Resp 02/04/19 1032 18     Temp 02/04/19 1032 98.4 F (36.9 C)     Temp Source 02/04/19 1032 Oral     SpO2 02/04/19 1032 97 %     Weight --      Height --      Head Circumference --      Peak Flow --      Pain Score 02/04/19 1033 2     Pain Loc --      Pain Edu? --      Excl. in [place]? --    No data found.  Updated Vital Signs BP (!) 139/99 (BP Location: Right Arm)   Pulse 79   Temp 98.4 F (36.9 C) (Oral)   Resp 18   SpO2 97%    Physical Exam Constitutional:      General: He is not in acute distress.    Appearance: He  is well-developed.  HENT:     Head: Normocephalic and atraumatic.  Eyes:     Conjunctiva/sclera: Conjunctivae normal.     Pupils: Pupils are equal, round, and reactive to light.  Neck:     Musculoskeletal: Normal range of motion.  Cardiovascular:     Rate and Rhythm: Normal rate.  Pulmonary:     Effort: Pulmonary  effort is normal. No respiratory distress.  Abdominal:     General: There is no distension.     Palpations: Abdomen is soft.  Musculoskeletal: Normal range of motion.  Skin:    General: Skin is warm and dry.     Comments: Right thumb has a paronychia.  Swabbed off alcohol.  Nicked with an 18-gauge needle.  With pressure moderate purulence was expressed.  Band-Aid placed.  Wound care discussed  Neurological:     Mental Status: He is alert.      UC Treatments / Results  Labs (all labs ordered are listed, but only abnormal results are displayed) Labs Reviewed - No data to display  EKG None  Radiology No results found.  Procedures Procedures (including critical care time)  Medications Ordered in UC Medications - No data to display  Initial Impression / Assessment and Plan / UC Course  I have reviewed the triage vital signs and the nursing notes.  Pertinent labs & imaging results that were available during my care of the patient were reviewed by me and considered in my medical decision making (see chart for details).      Final Clinical Impressions(s) / UC Diagnoses   Final diagnoses:  Acute paronychia of thumb, right     Discharge Instructions     Soak the thumb in warm water 2 or 3 times a day Take Keflex 2 times a day for 5 days Take 2 doses today Call or return if any problems develop   ED Prescriptions    Medication Sig Dispense Auth. Provider   cephALEXin (KEFLEX) 500 MG capsule Take 1 capsule (500 mg total) by mouth 2 (two) times daily. 10 capsule Eustace MooreNelson, Yvonne Sue, MD     Controlled Substance Prescriptions [place] Controlled Substance  Registry consulted? Not Applicable   Eustace MooreNelson, Yvonne Sue, MD 02/04/19 2043

## 2019-04-10 ENCOUNTER — Other Ambulatory Visit: Payer: Self-pay | Admitting: Family Medicine

## 2019-04-10 DIAGNOSIS — E119 Type 2 diabetes mellitus without complications: Secondary | ICD-10-CM

## 2019-04-28 ENCOUNTER — Other Ambulatory Visit: Payer: Self-pay | Admitting: Family Medicine

## 2019-04-28 DIAGNOSIS — I10 Essential (primary) hypertension: Secondary | ICD-10-CM

## 2019-05-21 ENCOUNTER — Other Ambulatory Visit: Payer: Self-pay | Admitting: Family Medicine

## 2019-05-21 DIAGNOSIS — I10 Essential (primary) hypertension: Secondary | ICD-10-CM

## 2019-05-21 DIAGNOSIS — E119 Type 2 diabetes mellitus without complications: Secondary | ICD-10-CM

## 2019-05-26 ENCOUNTER — Ambulatory Visit: Payer: Medicare Other | Admitting: Critical Care Medicine

## 2019-05-26 ENCOUNTER — Ambulatory Visit: Payer: Medicare Other | Attending: Critical Care Medicine | Admitting: Nurse Practitioner

## 2019-05-26 ENCOUNTER — Encounter: Payer: Self-pay | Admitting: Nurse Practitioner

## 2019-05-26 DIAGNOSIS — I10 Essential (primary) hypertension: Secondary | ICD-10-CM

## 2019-05-26 DIAGNOSIS — E11649 Type 2 diabetes mellitus with hypoglycemia without coma: Secondary | ICD-10-CM | POA: Diagnosis not present

## 2019-05-26 DIAGNOSIS — E119 Type 2 diabetes mellitus without complications: Secondary | ICD-10-CM

## 2019-05-26 DIAGNOSIS — Z8249 Family history of ischemic heart disease and other diseases of the circulatory system: Secondary | ICD-10-CM | POA: Diagnosis not present

## 2019-05-26 DIAGNOSIS — Z87891 Personal history of nicotine dependence: Secondary | ICD-10-CM | POA: Insufficient documentation

## 2019-05-26 DIAGNOSIS — Z7901 Long term (current) use of anticoagulants: Secondary | ICD-10-CM | POA: Insufficient documentation

## 2019-05-26 DIAGNOSIS — Z79899 Other long term (current) drug therapy: Secondary | ICD-10-CM | POA: Insufficient documentation

## 2019-05-26 MED ORDER — AMLODIPINE BESYLATE 10 MG PO TABS: 10.0000 mg | ORAL_TABLET | Freq: Every day | ORAL | 0 refills | Status: DC

## 2019-05-26 MED ORDER — SPIRONOLACTONE 25 MG PO TABS: 25.0000 mg | ORAL_TABLET | Freq: Every day | ORAL | 3 refills | Status: DC

## 2019-05-26 MED ORDER — GLIPIZIDE 10 MG PO TABS: 10.0000 mg | ORAL_TABLET | Freq: Two times a day (BID) | ORAL | 0 refills | Status: DC

## 2019-05-26 MED ORDER — LISINOPRIL-HYDROCHLOROTHIAZIDE 20-12.5 MG PO TABS: 2.0000 | ORAL_TABLET | Freq: Every day | ORAL | 0 refills | Status: DC

## 2019-05-26 MED ORDER — GLUCOSE BLOOD VI STRP: ORAL_STRIP | 12 refills | Status: DC

## 2019-05-26 NOTE — Progress Notes (Signed)
Virtual Visit via Telephone Note Due to national recommendations of social distancing due to COVID 19, telehealth visit is felt to be most appropriate for this patient at this time.  I discussed the limitations, risks, security and privacy concerns of performing an evaluation and management service by telephone and the availability of in person appointments. I also discussed with the patient that there may be a patient responsible charge related to this service. The patient expressed understanding and agreed to proceed.    I connected with Arnetha Massyharles E Schoeppner on 05/26/19  at   2:50 PM EDT  EDT by telephone and verified that I am speaking with the correct person using two identifiers.   Consent I discussed the limitations, risks, security and privacy concerns of performing an evaluation and management service by telephone and the availability of in person appointments. I also discussed with the patient that there may be a patient responsible charge related to this service. The patient expressed understanding and agreed to proceed.   Location of Patient: Private Residence    [facility]    Persons participating in Telemedicine visit: Bertram DenverZelda Katianne Barre FNP-BC YY BeebeBien CMA Arnetha Massyharles E Khatoon    History of Present Illness: Telemedicine visit for: HTN and DM med refills. Same Day appt.   has a past medical history of Diabetes mellitus without complication (HCC) and Hypertension.  Essential Hypertension He has not been monitoring his blood pressure at home. He does not have a blood pressure monitor. States he used to use his mother in law's when she lived with them but she is no longer there. He was placed on spironolactone in march by his PCP however today he states he was not aware of any new medications and has not been taking this medicine. Will send to pharmacy today. Denies chest pain, shortness of breath, palpitations, lightheadedness,  dizziness, headaches or BLE edema.  Current medications include amlodipine 10 mg daily, carvedilol 12.5 mg twice daily, clonidine 0.3 mg twice daily and lisinopril-hydrochlorothiazide 20-12.5 mg 2 tablets daily.  BP Readings from Last 3 Encounters:  02/04/19 (!) 139/99  10/21/18 (!) 181/84  08/01/18 (!) 179/99   DM TYPE 2 Out of strips so has not been monitoring his blood levels for about 2 weeks. Average postprandial readings 130-145 prior to running out of strips. Denies any hypo or hyperglycemic symptoms. He never picked up his victoza from his march office visit with his PCP. States he was unaware of medication changes. Overdue for eye exam.  Current medication: glipizide 10 mg daily. Lab Results  Component Value Date   HGBA1C 8.2 (A) 10/21/2018   Past Medical History:  Diagnosis Date  . Diabetes mellitus without complication (HCC)   . Hypertension     Past Surgical History:  Procedure Laterality Date  . BRAIN SURGERY    . ESOPHAGOGASTRODUODENOSCOPY N/A 03/22/2013   Procedure: ESOPHAGOGASTRODUODENOSCOPY (EGD);  Surgeon: Graylin ShiverSalem F Ganem, MD;  Location[facility];  Service: Endoscopy;  Laterality: N/A;    Family History  Problem Relation Age of Onset  . Hypertension Mother   . Cancer Mother [id number]       ovarian  . Diabetes Father   . Stroke Father     Social History   Socioeconomic History  . Marital status: Married    Spouse name: Not on file  . Number of children: Not on file  . Years of education: Not on file  . Highest education level: Not on file  Occupational  History  . Not on file  Social Needs  . Financial resource strain: Not on file  . Food insecurity    Worry: Not on file    Inability: Not on file  . Transportation needs    Medical: Not on file    Non-medical: Not on file  Tobacco Use  . Smoking status: Former Smoker    Packs/day: 1.00    Years: 16.00    Pack years: 16.00    Types: Cigarettes    Start date: 08/14/1974    Quit date: 08/14/1990    Years  since quitting: 28.8  . Smokeless tobacco: Former Systems developer    Quit date: 08/14/1990  Substance and Sexual Activity  . Alcohol use: Yes    Alcohol/week: 4.0 standard drinks    Types: 2 Cans of beer, 2 Shots of liquor per week    Comment: occas  . Drug use: No  . Sexual activity: Yes  Lifestyle  . Physical activity    Days per week: Not on file    Minutes per session: Not on file  . Stress: Not on file  Relationships  . Social Herbalist on phone: Not on file    Gets together: Not on file    Attends religious service: Not on file    Active member of club or organization: Not on file    Attends meetings of clubs or organizations: Not on file    Relationship status: Not on file  Other Topics Concern  . Not on file  Social History Narrative  . Not on file     Observations/Objective: Awake, alert and oriented x 3   Review of Systems  Constitutional: Negative for fever, malaise/fatigue and weight loss.  HENT: Negative.  Negative for nosebleeds.   Eyes: Negative.  Negative for blurred vision, double vision and photophobia.  Respiratory: Negative.  Negative for cough and shortness of breath.   Cardiovascular: Negative.  Negative for chest pain, palpitations and leg swelling.  Gastrointestinal: Negative.  Negative for heartburn, nausea and vomiting.  Musculoskeletal: Negative.  Negative for myalgias.  Neurological: Negative.  Negative for dizziness, focal weakness, seizures and headaches.  Psychiatric/Behavioral: Negative.  Negative for suicidal ideas.    Assessment and Plan:   Diagnoses and all orders for this visit:  Essential hypertension -     lisinopril-hydrochlorothiazide (ZESTORETIC) 20-12.5 MG tablet; Take 2 tablets by mouth daily. -     amLODipine (NORVASC) 10 MG tablet; Take 1 tablet (10 mg total) by mouth daily. -     spironolactone (ALDACTONE) 25 MG tablet; Take 1 tablet (25 mg total) by mouth daily. Continue all antihypertensives as prescribed.  Remember  to bring in your blood pressure log with you for your follow up appointment.  DASH/Mediterranean Diets are healthier choices for HTN.    Type 2 diabetes mellitus without complication, without long-term current use of insulin (HCC) -     glipiZIDE (GLUCOTROL) 10 MG tablet; Take 1 tablet (10 mg total) by mouth 2 (two) times daily before a meal. Must have office visit for refills -     glucose blood (ACCU-CHEK AVIVA) test strip; Use 3 times daily before meals -     Ambulatory referral to Ophthalmology Continue blood sugar control as discussed in office today, low carbohydrate diet, and regular physical exercise as tolerated, 150 minutes per week (30 min each day, 5 days per week, or 50 min 3 days per week). Keep blood sugar logs with fasting goal of 90-130  mg/dl, post prandial (after you eat) less than 180.  For Hypoglycemia: BS <60 and Hyperglycemia BS >400; contact the clinic ASAP. Annual eye exams and foot exams are recommended.     Follow Up Instructions Return for f/u with PCP for office visit and labs. Was same day appt today and not seen in office.     I discussed the assessment and treatment plan with the patient. The patient was provided an opportunity to ask questions and all were answered. The patient agreed with the plan and demonstrated an understanding of the instructions.   The patient was advised to call back or seek an in-person evaluation if the symptoms worsen or if the condition fails to improve as anticipated.  I provided 19 minutes of non-face-to-face time during this encounter including median intraservice time, reviewing previous notes, labs, imaging, medications and explaining diagnosis and management.  Claiborne Rigg, FNP-BC

## 2019-05-30 ENCOUNTER — Other Ambulatory Visit: Payer: Medicare Other

## 2019-06-17 ENCOUNTER — Encounter: Payer: Self-pay | Admitting: Family Medicine

## 2019-06-17 ENCOUNTER — Other Ambulatory Visit: Payer: Self-pay

## 2019-06-17 ENCOUNTER — Ambulatory Visit: Payer: Medicare Other | Attending: Family Medicine | Admitting: Family Medicine

## 2019-06-17 VITALS — BP 140/98 | HR 73 | Temp 98.2°F | Ht 76.0 in | Wt 322.0 lb

## 2019-06-17 DIAGNOSIS — Z833 Family history of diabetes mellitus: Secondary | ICD-10-CM | POA: Diagnosis not present

## 2019-06-17 DIAGNOSIS — I10 Essential (primary) hypertension: Secondary | ICD-10-CM

## 2019-06-17 DIAGNOSIS — M25561 Pain in right knee: Secondary | ICD-10-CM | POA: Diagnosis not present

## 2019-06-17 DIAGNOSIS — Z6839 Body mass index (BMI) 39.0-39.9, adult: Secondary | ICD-10-CM | POA: Diagnosis not present

## 2019-06-17 DIAGNOSIS — G8929 Other chronic pain: Secondary | ICD-10-CM | POA: Diagnosis not present

## 2019-06-17 DIAGNOSIS — Z885 Allergy status to narcotic agent status: Secondary | ICD-10-CM | POA: Insufficient documentation

## 2019-06-17 DIAGNOSIS — Z823 Family history of stroke: Secondary | ICD-10-CM | POA: Diagnosis not present

## 2019-06-17 DIAGNOSIS — E7800 Pure hypercholesterolemia, unspecified: Secondary | ICD-10-CM | POA: Diagnosis not present

## 2019-06-17 DIAGNOSIS — Z91013 Allergy to seafood: Secondary | ICD-10-CM | POA: Insufficient documentation

## 2019-06-17 DIAGNOSIS — Z8249 Family history of ischemic heart disease and other diseases of the circulatory system: Secondary | ICD-10-CM | POA: Insufficient documentation

## 2019-06-17 DIAGNOSIS — E1149 Type 2 diabetes mellitus with other diabetic neurological complication: Secondary | ICD-10-CM

## 2019-06-17 DIAGNOSIS — Z7984 Long term (current) use of oral hypoglycemic drugs: Secondary | ICD-10-CM | POA: Diagnosis not present

## 2019-06-17 DIAGNOSIS — E119 Type 2 diabetes mellitus without complications: Secondary | ICD-10-CM | POA: Diagnosis present

## 2019-06-17 DIAGNOSIS — Z79899 Other long term (current) drug therapy: Secondary | ICD-10-CM | POA: Diagnosis not present

## 2019-06-17 LAB — POCT GLYCOSYLATED HEMOGLOBIN (HGB A1C): HbA1c, POC (controlled diabetic range): 9.1 % — AB (ref 0.0–7.0)

## 2019-06-17 LAB — GLUCOSE, POCT (MANUAL RESULT ENTRY): POC Glucose: 211 mg/dl — AB (ref 70–99)

## 2019-06-17 MED ORDER — ATORVASTATIN CALCIUM 40 MG PO TABS: 40.0000 mg | ORAL_TABLET | Freq: Every day | ORAL | 1 refills | Status: DC

## 2019-06-17 MED ORDER — CARVEDILOL 12.5 MG PO TABS: 12.5000 mg | ORAL_TABLET | Freq: Two times a day (BID) | ORAL | 1 refills | Status: DC

## 2019-06-17 MED ORDER — JARDIANCE 10 MG PO TABS: 10.0000 mg | ORAL_TABLET | Freq: Every day | ORAL | 1 refills | Status: DC

## 2019-06-17 MED ORDER — AMLODIPINE BESYLATE 10 MG PO TABS: 10.0000 mg | ORAL_TABLET | Freq: Every day | ORAL | 1 refills | Status: DC

## 2019-06-17 MED ORDER — LISINOPRIL-HYDROCHLOROTHIAZIDE 20-12.5 MG PO TABS: 2.0000 | ORAL_TABLET | Freq: Every day | ORAL | 1 refills | Status: DC

## 2019-06-17 MED ORDER — GLIPIZIDE 10 MG PO TABS: 10.0000 mg | ORAL_TABLET | Freq: Two times a day (BID) | ORAL | 1 refills | Status: DC

## 2019-06-17 MED ORDER — GABAPENTIN 300 MG PO CAPS: 300.0000 mg | ORAL_CAPSULE | Freq: Every day | ORAL | 1 refills | Status: DC

## 2019-06-17 MED ORDER — CLONIDINE HCL 0.3 MG PO TABS: 0.3000 mg | ORAL_TABLET | Freq: Two times a day (BID) | ORAL | 1 refills | Status: DC

## 2019-06-17 MED ORDER — VICTOZA 18 MG/3ML ~~LOC~~ SOPN: PEN_INJECTOR | SUBCUTANEOUS | 3 refills | Status: DC

## 2019-06-17 NOTE — Patient Instructions (Signed)
Diabetes Mellitus and Exercise Exercising regularly is important for your overall health, especially when you have diabetes (diabetes mellitus). Exercising is not only about losing weight. It has many other health benefits, such as increasing muscle strength and bone density and reducing body fat and stress. This leads to improved fitness, flexibility, and endurance, all of which result in better overall health. Exercise has additional benefits for people with diabetes, including:  Reducing appetite.  Helping to lower and control blood glucose.  Lowering blood pressure.  Helping to control amounts of fatty substances (lipids) in the blood, such as cholesterol and triglycerides.  Helping the body to respond better to insulin (improving insulin sensitivity).  Reducing how much insulin the body needs.  Decreasing the risk for heart disease by: ? Lowering cholesterol and triglyceride levels. ? Increasing the levels of good cholesterol. ? Lowering blood glucose levels. What is my activity plan? Your health care provider or certified diabetes educator can help you make a plan for the type and frequency of exercise (activity plan) that works for you. Make sure that you:  Do at least 150 minutes of moderate-intensity or vigorous-intensity exercise each week. This could be brisk walking, biking, or water aerobics. ? Do stretching and strength exercises, such as yoga or weightlifting, at least 2 times a week. ? Spread out your activity over at least 3 days of the week.  Get some form of physical activity every day. ? Do not go more than 2 days in a row without some kind of physical activity. ? Avoid being inactive for more than 30 minutes at a time. Take frequent breaks to walk or stretch.  Choose a type of exercise or activity that you enjoy, and set realistic goals.  Start slowly, and gradually increase the intensity of your exercise over time. What do I need to know about managing my  diabetes?   Check your blood glucose before and after exercising. ? If your blood glucose is 240 mg/dL (13.3 mmol/L) or higher before you exercise, check your urine for ketones. If you have ketones in your urine, do not exercise until your blood glucose returns to normal. ? If your blood glucose is 100 mg/dL (5.6 mmol/L) or lower, eat a snack containing 15-20 grams of carbohydrate. Check your blood glucose 15 minutes after the snack to make sure that your level is above 100 mg/dL (5.6 mmol/L) before you start your exercise.  Know the symptoms of low blood glucose (hypoglycemia) and how to treat it. Your risk for hypoglycemia increases during and after exercise. Common symptoms of hypoglycemia can include: ? Hunger. ? Anxiety. ? Sweating and feeling clammy. ? Confusion. ? Dizziness or feeling light-headed. ? Increased heart rate or palpitations. ? Blurry vision. ? Tingling or numbness around the mouth, lips, or tongue. ? Tremors or shakes. ? Irritability.  Keep a rapid-acting carbohydrate snack available before, during, and after exercise to help prevent or treat hypoglycemia.  Avoid injecting insulin into areas of the body that are going to be exercised. For example, avoid injecting insulin into: ? The arms, when playing tennis. ? The legs, when jogging.  Keep records of your exercise habits. Doing this can help you and your health care provider adjust your diabetes management plan as needed. Write down: ? Food that you eat before and after you exercise. ? Blood glucose levels before and after you exercise. ? The type and amount of exercise you have done. ? When your insulin is expected to peak, if you use   insulin. Avoid exercising at times when your insulin is peaking.  When you start a new exercise or activity, work with your health care provider to make sure the activity is safe for you, and to adjust your insulin, medicines, or food intake as needed.  Drink plenty of water while  you exercise to prevent dehydration or heat stroke. Drink enough fluid to keep your urine clear or pale yellow. Summary  Exercising regularly is important for your overall health, especially when you have diabetes (diabetes mellitus).  Exercising has many health benefits, such as increasing muscle strength and bone density and reducing body fat and stress.  Your health care provider or certified diabetes educator can help you make a plan for the type and frequency of exercise (activity plan) that works for you.  When you start a new exercise or activity, work with your health care provider to make sure the activity is safe for you, and to adjust your insulin, medicines, or food intake as needed. This information is not intended to replace advice given to you by your health care provider. Make sure you discuss any questions you have with your health care provider. Document Released: 10/21/2003 Document Revised: 02/22/2017 Document Reviewed: 01/10/2016 [employer] Patient Education  2020 [employer]  

## 2019-06-17 NOTE — Progress Notes (Addendum)
Subjective:  Patient ID: Anthony Barnes, male    DOB: 08-08-1956  Age: 63 y.o. MRN: [id number]  CC: Diabetes and Hypertension   HPI Anthony Barnes is a 63 year old male with a history of hypertension, type 2 diabetes mellitus A1c 9.1, morbid obesity here for follow-up visit. His A1c is 9.1, which is up from 8.2 previously he informs me he has been unable to obtain Victoza due to the cost but has been compliant with Glipizide.  We have discussed insulin in the past and he was not open to it.  He does have shooting pain in his right leg at the site where he previously had a right leg ulcer which has now healed.  Also has pain in his right knee which is worse with walking. His blood pressure was initially elevated in the 160s and repeat performed after he had been sitting for about 15 minutes returned at 140/98.  I had placed him on spironolactone previously however he discontinued it because it caused left-sided abdominal and left flank pain which resolved upon discontinuation but he endorses compliance with his other antihypertensives. On review of his medications he has not been taking atorvastatin as he states he was unaware he needed to. With regards to exercise he has not been really active due to the ongoing pandemic but promises to increase his level of activity.  Past Medical History:  Diagnosis Date  . Diabetes mellitus without complication ([place])   . Hypertension     Past Surgical History:  Procedure Laterality Date  . BRAIN SURGERY    . ESOPHAGOGASTRODUODENOSCOPY N/A 03/22/2013   Procedure: ESOPHAGOGASTRODUODENOSCOPY (EGD);  Surgeon: Wonda Horner, MD;  Location: [facility] ENDOSCOPY;  Service: Endoscopy;  Laterality: N/A;    Family History  Problem Relation Age of Onset  . Hypertension Mother   . Cancer Mother 46       ovarian  . Diabetes Father   . Stroke Father     Allergies  Allergen Reactions  . Shellfish Allergy Anaphylaxis  . Codeine Itching    unknown     Outpatient Medications Prior to Visit  Medication Sig Dispense Refill  . Blood Glucose Monitoring Suppl (ACCU-CHEK AVIVA) device Use as instructed 3 times daily before meals. 1 each 0  . glucose blood (ACCU-CHEK AVIVA) test strip Use 3 times daily before meals 100 each 12  . ibuprofen (ADVIL,MOTRIN) 800 MG tablet Take 1 tablet (800 mg total) by mouth every 12 (twelve) hours as needed. 30 tablet 0  . Lancets (ACCU-CHEK MULTICLIX) lancets Use as instructed 3 times daily before meals 100 each 12  . Multiple Vitamin (MULTIVITAMIN WITH MINERALS) TABS tablet Take 1 tablet by mouth daily.    Marland Kitchen omega-3 acid ethyl esters (LOVAZA) 1 g capsule Take 1 g by mouth 2 (two) times daily.    Marland Kitchen acetaminophen-codeine (TYLENOL #3) 300-30 MG tablet Take 1 tablet by mouth every 8 (eight) hours as needed for moderate pain. Take with zyrtec 3m daily 30 tablet 0  . amLODipine (NORVASC) 10 MG tablet Take 1 tablet (10 mg total) by mouth daily. 30 tablet 0  . carvedilol (COREG) 12.5 MG tablet Take 1 tablet (12.5 mg total) by mouth 2 (two) times daily with a meal. 180 tablet 1  . cloNIDine (CATAPRES) 0.3 MG tablet Take 1 tablet (0.3 mg total) by mouth 2 (two) times daily. 180 tablet 1  . glipiZIDE (GLUCOTROL) 10 MG tablet Take 1 tablet (10 mg total) by mouth 2 (two) times daily before  a meal. Must have office visit for refills 60 tablet 0  . lisinopril-hydrochlorothiazide (ZESTORETIC) 20-12.5 MG tablet Take 2 tablets by mouth daily. 60 tablet 0  . atorvastatin (LIPITOR) 40 MG tablet Take 1 tablet (40 mg total) by mouth daily. (Patient not taking: Reported on 05/26/2019) 90 tablet 1  . liraglutide (VICTOZA) 18 MG/3ML SOPN Inject subcutaneously daily 0.6 mg for 1 week then 1.2 mg for 1 week then 1.8 mg thereafter. (Patient not taking: Reported on 05/26/2019) 30 mL 3  . spironolactone (ALDACTONE) 25 MG tablet Take 1 tablet (25 mg total) by mouth daily. (Patient not taking: Reported on 06/17/2019) 30 tablet 3   No  facility-administered medications prior to visit.      ROS Review of Systems  Constitutional: Negative for activity change and appetite change.  HENT: Negative for sinus pressure and sore throat.   Eyes: Negative for visual disturbance.  Respiratory: Negative for cough, chest tightness and shortness of breath.   Cardiovascular: Negative for chest pain and leg swelling.  Gastrointestinal: Negative for abdominal distention, abdominal pain, constipation and diarrhea.  Endocrine: Negative.   Genitourinary: Negative for dysuria.  Skin: Negative for rash.  Allergic/Immunologic: Negative.   Neurological: Negative for weakness, light-headedness and numbness.  Psychiatric/Behavioral: Negative for dysphoric mood and suicidal ideas.    Objective:  BP (!) 140/98   Pulse 73   Temp 98.2 F (36.8 C) (Oral)   Ht 6' 4"  (1.93 m)   Wt (!) 322 lb (146.1 kg)   SpO2 98%   BMI 39.20 kg/m   BP/Weight 06/17/2019 9/48/0165 12/14/7480  Systolic BP 707 867 544  Diastolic BP 98 99 84  Wt. (Lbs) 322 - 327.2  BMI 39.2 - 39.83      Physical Exam Constitutional:      Appearance: He is well-developed.  Neck:     Vascular: No JVD.  Cardiovascular:     Rate and Rhythm: Normal rate.     Heart sounds: Normal heart sounds. No murmur.  Pulmonary:     Effort: Pulmonary effort is normal.     Breath sounds: Normal breath sounds. No wheezing or rales.  Chest:     Chest wall: No tenderness.  Abdominal:     General: Bowel sounds are normal. There is no distension.     Palpations: Abdomen is soft. There is no mass.     Tenderness: There is no abdominal tenderness.  Musculoskeletal: Normal range of motion.     Right lower leg: No edema.     Left lower leg: No edema.  Neurological:     Mental Status: He is alert and oriented to person, place, and time.  Psychiatric:        Mood and Affect: Mood normal.     CMP Latest Ref Rng & Units 10/21/2018 11/25/2017 07/02/2017  Glucose 65 - 99 mg/dL 138(H) 153(H)  104(H)  BUN 8 - 27 mg/dL 14 7 11   Creatinine 0.76 - 1.27 mg/dL 0.89 0.87 0.83  Sodium 134 - 144 mmol/L 136 134(L) 140  Potassium 3.5 - 5.2 mmol/L 3.9 3.9 4.3  Chloride 96 - 106 mmol/L 98 102 102  CO2 20 - 29 mmol/L 24 22 24   Calcium 8.6 - 10.2 mg/dL 9.9 9.1 9.5  Total Protein 6.0 - 8.5 g/dL 8.1 - 7.8  Total Bilirubin 0.0 - 1.2 mg/dL 0.3 - 0.3  Alkaline Phos 39 - 117 IU/L 55 - 55  AST 0 - 40 IU/L 17 - 20  ALT 0 - 44 IU/L  31 - 29    Lipid Panel     Component Value Date/Time   CHOL 156 07/02/2017 1617   TRIG 66 07/02/2017 1617   HDL 48 07/02/2017 1617   CHOLHDL 3.3 07/02/2017 1617   CHOLHDL 3.2 07/21/2016 1051   VLDL 10 07/21/2016 1051   LDLCALC 95 07/02/2017 1617    CBC    Component Value Date/Time   WBC 8.1 11/25/2017 0407   RBC 4.82 11/25/2017 0407   HGB 13.5 11/25/2017 0407   HCT 39.1 11/25/2017 0407   PLT 211 11/25/2017 0407   MCV 81.1 11/25/2017 0407   MCH 28.0 11/25/2017 0407   MCHC 34.5 11/25/2017 0407   RDW 15.4 11/25/2017 0407   LYMPHSABS 1.8 10/20/2013 1250   MONOABS 0.7 10/20/2013 1250   EOSABS 0.1 10/20/2013 1250   BASOSABS 0.0 10/20/2013 1250    Lab Results  Component Value Date   HGBA1C 9.1 (A) 06/17/2019    Assessment & Plan:   1. Type 2 diabetes mellitus with other neurologic complication, without long-term current use of insulin (HCC) Uncontrolled with A1c of 9.1 and goal is less than 7 He declines initiation of insulin and states " but rather not do anything with insulin even if he calls me my life" Unable to afford Victoza due to cost We will commence Jardiance but I have informed him that there is no guarantee that this will suffice with regards to optimization of glycemic control Commenced gabapentin due to shooting pains in his right lower extremity which could be a manifestation of diabetic neuropathy He promises to work on his lifestyle modifications and diet - POCT glucose (manual entry) - POCT glycosylated hemoglobin (Hb A1C) -  gabapentin (NEURONTIN) 300 MG capsule; Take 1 capsule (300 mg total) by mouth at bedtime.  Dispense: 90 capsule; Refill: 1 - CMP14+EGFR; Future - Lipid panel; Future - Microalbumin/Creatinine Ratio, Urine; Future - empagliflozin (JARDIANCE) 10 MG TABS tablet; Take 10 mg by mouth daily before breakfast.  Dispense: 90 tablet; Refill: 1 - glipiZIDE (GLUCOTROL) 10 MG tablet; Take 1 tablet (10 mg total) by mouth 2 (two) times daily before a meal.  Dispense: 180 tablet; Refill: 1 - liraglutide (VICTOZA) 18 MG/3ML SOPN; Inject subcutaneously daily 0.6 mg for 1 week then 1.2 mg for 1 week then 1.8 mg thereafter.  Dispense: 30 mL; Refill: 3  2. Essential hypertension Not at goal Discontinued spironolactone which was previously controlled Would love to add on hydralazine which he declined; discussed option of increasing clonidine from twice daily to 3 times daily which he states will be difficult to comply with Again he would like to be given the opportunity to work on lifestyle changes to bring his blood pressure down Counseled on blood pressure goal of less than 130/80, low-sodium, DASH diet, medication compliance, 150 minutes of moderate intensity exercise per week. Discussed medication compliance, adverse effects. - amLODipine (NORVASC) 10 MG tablet; Take 1 tablet (10 mg total) by mouth daily.  Dispense: 90 tablet; Refill: 1 - carvedilol (COREG) 12.5 MG tablet; Take 1 tablet (12.5 mg total) by mouth 2 (two) times daily with a meal.  Dispense: 180 tablet; Refill: 1 - cloNIDine (CATAPRES) 0.3 MG tablet; Take 1 tablet (0.3 mg total) by mouth 2 (two) times daily.  Dispense: 180 tablet; Refill: 1 - lisinopril-hydrochlorothiazide (ZESTORETIC) 20-12.5 MG tablet; Take 2 tablets by mouth daily.  Dispense: 180 tablet; Refill: 1  3. Pure hypercholesterolemia Uncontrolled as he has not been taking Lipitor If lipid panel is elevated I  will make no regimen changes Encouraged to comply with Lipitor and  low-cholesterol diet - atorvastatin (LIPITOR) 40 MG tablet; Take 1 tablet (40 mg total) by mouth daily.  Dispense: 90 tablet; Refill: 1  4. Morbid obesity ([place]) Counseled on 150 minutes of exercise per week, healthy eating (including decreased daily intake of saturated fats, cholesterol, added sugars, sodium)  5. Chronic pain of right knee Likely underlying osteoarthritis Advised to use OTC NSAIDs Weight loss will be beneficial   Meds ordered this encounter  Medications  . gabapentin (NEURONTIN) 300 MG capsule    Sig: Take 1 capsule (300 mg total) by mouth at bedtime.    Dispense:  90 capsule    Refill:  1  . empagliflozin (JARDIANCE) 10 MG TABS tablet    Sig: Take 10 mg by mouth daily before breakfast.    Dispense:  90 tablet    Refill:  1  . amLODipine (NORVASC) 10 MG tablet    Sig: Take 1 tablet (10 mg total) by mouth daily.    Dispense:  90 tablet    Refill:  1  . atorvastatin (LIPITOR) 40 MG tablet    Sig: Take 1 tablet (40 mg total) by mouth daily.    Dispense:  90 tablet    Refill:  1  . carvedilol (COREG) 12.5 MG tablet    Sig: Take 1 tablet (12.5 mg total) by mouth 2 (two) times daily with a meal.    Dispense:  180 tablet    Refill:  1    Dispense 90-day supply  . cloNIDine (CATAPRES) 0.3 MG tablet    Sig: Take 1 tablet (0.3 mg total) by mouth 2 (two) times daily.    Dispense:  180 tablet    Refill:  1  . glipiZIDE (GLUCOTROL) 10 MG tablet    Sig: Take 1 tablet (10 mg total) by mouth 2 (two) times daily before a meal.    Dispense:  180 tablet    Refill:  1  . liraglutide (VICTOZA) 18 MG/3ML SOPN    Sig: Inject subcutaneously daily 0.6 mg for 1 week then 1.2 mg for 1 week then 1.8 mg thereafter.    Dispense:  30 mL    Refill:  3  . lisinopril-hydrochlorothiazide (ZESTORETIC) 20-12.5 MG tablet    Sig: Take 2 tablets by mouth daily.    Dispense:  180 tablet    Refill:  1    Follow-up: Return in about 3 months (around 09/17/2019) for Diabetes mellitus -in  person.       Charlott Rakes, MD, FAAFP. [facility] and [place], [place]   06/17/2019, 11:32 AM

## 2019-06-24 ENCOUNTER — Ambulatory Visit: Payer: Medicare Other | Attending: Family Medicine

## 2019-06-24 ENCOUNTER — Other Ambulatory Visit: Payer: Self-pay

## 2019-06-24 DIAGNOSIS — E1149 Type 2 diabetes mellitus with other diabetic neurological complication: Secondary | ICD-10-CM

## 2019-06-25 LAB — CMP14+EGFR
ALT: 29 IU/L (ref 0–44)
AST: 22 IU/L (ref 0–40)
Albumin/Globulin Ratio: 1.4 (ref 1.2–2.2)
Albumin: 4.4 g/dL (ref 3.8–4.8)
Alkaline Phosphatase: 58 IU/L (ref 39–117)
BUN/Creatinine Ratio: 14 (ref 10–24)
BUN: 16 mg/dL (ref 8–27)
Bilirubin Total: 0.4 mg/dL (ref 0.0–1.2)
CO2: 26 mmol/L (ref 20–29)
Calcium: 9.8 mg/dL (ref 8.6–10.2)
Chloride: 98 mmol/L (ref 96–106)
Creatinine, Ser: 1.11 mg/dL (ref 0.76–1.27)
GFR calc Af Amer: 82 mL/min/{1.73_m2} (ref >59)
GFR calc non Af Amer: 71 mL/min/{1.73_m2} (ref >59)
Globulin, Total: 3.1 g/dL (ref 1.5–4.5)
Glucose: 217 mg/dL — ABNORMAL HIGH (ref 65–99)
Potassium: 4.4 mmol/L (ref 3.5–5.2)
Sodium: 138 mmol/L (ref 134–144)
Total Protein: 7.5 g/dL (ref 6.0–8.5)

## 2019-06-25 LAB — LIPID PANEL
Chol/HDL Ratio: 3.3 ratio (ref 0.0–5.0)
Cholesterol, Total: 147 mg/dL (ref 100–199)
HDL: 45 mg/dL (ref >39)
LDL Chol Calc (NIH): 88 mg/dL (ref 0–99)
Triglycerides: 70 mg/dL (ref 0–149)
VLDL Cholesterol Cal: 14 mg/dL (ref 5–40)

## 2019-06-25 LAB — MICROALBUMIN / CREATININE URINE RATIO
Creatinine, Urine: 177.5 mg/dL
Microalb/Creat Ratio: 20 mg/g creat (ref 0–29)
Microalbumin, Urine: 34.7 ug/mL

## 2019-06-27 ENCOUNTER — Telehealth: Payer: Self-pay

## 2019-06-27 NOTE — Telephone Encounter (Signed)
Patient name and DOB has been verified Patient was informed of lab results. Patient had no questions.  

## 2019-06-27 NOTE — Telephone Encounter (Signed)
-----   Message from Charlott Rakes, MD sent at 06/26/2019  8:31 AM EST ----- Cholesterol, kidney and liver functions are normal.  He is encouraged to continue working on diabetic regimen discussed at length with him.

## 2019-09-17 ENCOUNTER — Telehealth: Payer: Self-pay | Admitting: *Deleted

## 2019-09-17 ENCOUNTER — Ambulatory Visit: Payer: Medicare Other | Admitting: Family Medicine

## 2019-09-17 NOTE — Telephone Encounter (Signed)
Patient was called last night to remind him of his appt. Today. He asked to be rescheduled.  Called patient and LVM to return call to reschedule appt with PCP.

## 2019-10-15 ENCOUNTER — Ambulatory Visit: Payer: Medicare Other | Admitting: Family Medicine

## 2019-11-18 ENCOUNTER — Ambulatory Visit: Payer: Medicare Other | Admitting: Family Medicine

## 2019-12-16 ENCOUNTER — Other Ambulatory Visit: Payer: Self-pay

## 2019-12-16 ENCOUNTER — Ambulatory Visit: Payer: Medicare Other | Attending: Family Medicine | Admitting: Family Medicine

## 2019-12-16 ENCOUNTER — Encounter: Payer: Self-pay | Admitting: Family Medicine

## 2019-12-16 VITALS — BP 155/91 | HR 71 | Ht 76.0 in | Wt 330.0 lb

## 2019-12-16 DIAGNOSIS — Z833 Family history of diabetes mellitus: Secondary | ICD-10-CM | POA: Diagnosis not present

## 2019-12-16 DIAGNOSIS — I10 Essential (primary) hypertension: Secondary | ICD-10-CM | POA: Diagnosis not present

## 2019-12-16 DIAGNOSIS — Z8249 Family history of ischemic heart disease and other diseases of the circulatory system: Secondary | ICD-10-CM | POA: Diagnosis not present

## 2019-12-16 DIAGNOSIS — Z7984 Long term (current) use of oral hypoglycemic drugs: Secondary | ICD-10-CM | POA: Diagnosis not present

## 2019-12-16 DIAGNOSIS — E1142 Type 2 diabetes mellitus with diabetic polyneuropathy: Secondary | ICD-10-CM | POA: Diagnosis not present

## 2019-12-16 DIAGNOSIS — Z713 Dietary counseling and surveillance: Secondary | ICD-10-CM | POA: Insufficient documentation

## 2019-12-16 DIAGNOSIS — Z6841 Body Mass Index (BMI) 40.0 and over, adult: Secondary | ICD-10-CM | POA: Diagnosis not present

## 2019-12-16 DIAGNOSIS — Z91120 Patient's intentional underdosing of medication regimen due to financial hardship: Secondary | ICD-10-CM | POA: Diagnosis not present

## 2019-12-16 DIAGNOSIS — E1169 Type 2 diabetes mellitus with other specified complication: Secondary | ICD-10-CM

## 2019-12-16 DIAGNOSIS — Z823 Family history of stroke: Secondary | ICD-10-CM | POA: Insufficient documentation

## 2019-12-16 DIAGNOSIS — Z9111 Patient's noncompliance with dietary regimen: Secondary | ICD-10-CM | POA: Insufficient documentation

## 2019-12-16 DIAGNOSIS — E1165 Type 2 diabetes mellitus with hyperglycemia: Secondary | ICD-10-CM | POA: Diagnosis not present

## 2019-12-16 DIAGNOSIS — T50996A Underdosing of other drugs, medicaments and biological substances, initial encounter: Secondary | ICD-10-CM | POA: Insufficient documentation

## 2019-12-16 DIAGNOSIS — Z79899 Other long term (current) drug therapy: Secondary | ICD-10-CM | POA: Diagnosis not present

## 2019-12-16 DIAGNOSIS — E1149 Type 2 diabetes mellitus with other diabetic neurological complication: Secondary | ICD-10-CM

## 2019-12-16 DIAGNOSIS — E785 Hyperlipidemia, unspecified: Secondary | ICD-10-CM | POA: Insufficient documentation

## 2019-12-16 LAB — GLUCOSE, POCT (MANUAL RESULT ENTRY): POC Glucose: 228 mg/dl — AB (ref 70–99)

## 2019-12-16 LAB — POCT GLYCOSYLATED HEMOGLOBIN (HGB A1C): HbA1c, POC (controlled diabetic range): 10.2 % — AB (ref 0.0–7.0)

## 2019-12-16 MED ORDER — ATORVASTATIN CALCIUM 40 MG PO TABS: 40.0000 mg | ORAL_TABLET | Freq: Every day | ORAL | 1 refills | Status: DC

## 2019-12-16 MED ORDER — VICTOZA 18 MG/3ML ~~LOC~~ SOPN: PEN_INJECTOR | SUBCUTANEOUS | 3 refills | Status: DC

## 2019-12-16 MED ORDER — CARVEDILOL 25 MG PO TABS: 25.0000 mg | ORAL_TABLET | Freq: Two times a day (BID) | ORAL | 1 refills | Status: DC

## 2019-12-16 MED ORDER — GLIPIZIDE 10 MG PO TABS: 10.0000 mg | ORAL_TABLET | Freq: Two times a day (BID) | ORAL | 1 refills | Status: DC

## 2019-12-16 MED ORDER — CLONIDINE HCL 0.3 MG PO TABS: 0.3000 mg | ORAL_TABLET | Freq: Two times a day (BID) | ORAL | 1 refills | Status: DC

## 2019-12-16 MED ORDER — LISINOPRIL-HYDROCHLOROTHIAZIDE 20-12.5 MG PO TABS: 2.0000 | ORAL_TABLET | Freq: Every day | ORAL | 1 refills | Status: DC

## 2019-12-16 MED ORDER — AMLODIPINE BESYLATE 10 MG PO TABS: 10.0000 mg | ORAL_TABLET | Freq: Every day | ORAL | 1 refills | Status: DC

## 2019-12-16 MED ORDER — GABAPENTIN 300 MG PO CAPS: 300.0000 mg | ORAL_CAPSULE | Freq: Every day | ORAL | 1 refills | Status: DC

## 2019-12-16 MED FILL — !VICTOZA 18MG/3ML INJECT: 18 | 17 days supply | Qty: 3 | Fill #0

## 2019-12-16 NOTE — Progress Notes (Signed)
Subjective:  Patient ID: Anthony Barnes, male    DOB: Apr 05, 1956  Age: 64 y.o. MRN: [id number]  CC: Diabetes   HPI QUINTUS PREMO  is a 64 year old male with a history of hypertension, type 2 diabetes mellitus A1c10.2, morbid obesityhere for follow-up visit.  His A1c is 10.2 which is up from 9.1 previously.  Victoza appears on his med list however he has been able to afford this.  He endorses compliance with his normal medications. His blood pressure is also elevated and he endorses compliance with his antihypertensive. He does not exercise and is not adherent to a diabetic diet. Doing well on his statin with no complaints of myalgias He has no chest pain, dyspnea and has no additional concerns today.  Past Medical History:  Diagnosis Date  . Diabetes mellitus without complication (HCC)   . Hypertension     Past Surgical History:  Procedure Laterality Date  . BRAIN SURGERY    . ESOPHAGOGASTRODUODENOSCOPY N/A 03/22/2013   Procedure: ESOPHAGOGASTRODUODENOSCOPY (EGD);  Surgeon: Graylin Shiver, MD;  Location[facility] ENDOSCOPY;  Service: Endoscopy;  Laterality: N/A;    Family History  Problem Relation Age of Onset  . Hypertension Mother   . Cancer Mother 24       ovarian  . Diabetes Father   . Stroke Father     Allergies  Allergen Reactions  . Shellfish Allergy Anaphylaxis  . Codeine Itching    unknown    Outpatient Medications Prior to Visit  Medication Sig Dispense Refill  . atorvastatin (LIPITOR) 40 MG tablet Take 1 tablet (40 mg total) by mouth daily. 90 tablet 1  . Blood Glucose Monitoring Suppl (ACCU-CHEK AVIVA) device Use as instructed 3 times daily before meals. 1 each 0  . carvedilol (COREG) 12.5 MG tablet Take 1 tablet (12.5 mg total) by mouth 2 (two) times daily with a meal. 180 tablet 1  . cloNIDine (CATAPRES) 0.3 MG tablet Take 1 tablet (0.3 mg total) by mouth 2 (two) times daily. 180 tablet 1  . gabapentin (NEURONTIN) 300 MG capsule Take 1 capsule (300 mg  total) by mouth at bedtime. 90 capsule 1  . glipiZIDE (GLUCOTROL) 10 MG tablet Take 1 tablet (10 mg total) by mouth 2 (two) times daily before a meal. 180 tablet 1  . glucose blood (ACCU-CHEK AVIVA) test strip Use 3 times daily before meals 100 each 12  . ibuprofen (ADVIL,MOTRIN) 800 MG tablet Take 1 tablet (800 mg total) by mouth every 12 (twelve) hours as needed. 30 tablet 0  . Lancets (ACCU-CHEK MULTICLIX) lancets Use as instructed 3 times daily before meals 100 each 12  . lisinopril-hydrochlorothiazide (ZESTORETIC) 20-12.5 MG tablet Take 2 tablets by mouth daily. 180 tablet 1  . Multiple Vitamin (MULTIVITAMIN WITH MINERALS) TABS tablet Take 1 tablet by mouth daily.    Marland Kitchen omega-3 acid ethyl esters (LOVAZA) 1 g capsule Take 1 g by mouth 2 (two) times daily.    Marland Kitchen amLODipine (NORVASC) 10 MG tablet Take 1 tablet (10 mg total) by mouth daily. 90 tablet 1  . empagliflozin (JARDIANCE) 10 MG TABS tablet Take 10 mg by mouth daily before breakfast. (Patient not taking: Reported on 12/16/2019) 90 tablet 1  . liraglutide (VICTOZA) 18 MG/3ML SOPN Inject subcutaneously daily 0.6 mg for 1 week then 1.2 mg for 1 week then 1.8 mg thereafter. (Patient not taking: Reported on 12/16/2019) 30 mL 3   No facility-administered medications prior to visit.     ROS Review of Systems  Constitutional: Negative for activity change and appetite change.  HENT: Negative for sinus pressure and sore throat.   Eyes: Negative for visual disturbance.  Respiratory: Negative for cough, chest tightness and shortness of breath.   Cardiovascular: Negative for chest pain and leg swelling.  Gastrointestinal: Negative for abdominal distention, abdominal pain, constipation and diarrhea.  Endocrine: Negative.   Genitourinary: Negative for dysuria.  Musculoskeletal: Negative for joint swelling and myalgias.  Skin: Negative for rash.  Allergic/Immunologic: Negative.   Neurological: Negative for weakness, light-headedness and numbness.    Psychiatric/Behavioral: Negative for dysphoric mood and suicidal ideas.    Objective:  BP (!) 155/91   Pulse 71   Ht 6\' 4"  (1.93 m)   Wt (!) 330 lb (149.7 kg)   SpO2 97%   BMI 40.17 kg/m   BP/Weight 12/16/2019 06/17/2019 02/04/2019  Systolic BP 155 140 139  Diastolic BP 91 98 99  Wt. (Lbs) 330 322 -  BMI 40.17 39.2 -      Physical Exam Constitutional:      Appearance: He is well-developed. He is obese.  Neck:     Vascular: No JVD.  Cardiovascular:     Rate and Rhythm: Normal rate.     Heart sounds: Normal heart sounds. No murmur.  Pulmonary:     Effort: Pulmonary effort is normal.     Breath sounds: Normal breath sounds. No wheezing or rales.  Chest:     Chest wall: No tenderness.  Abdominal:     General: Bowel sounds are normal. There is no distension.     Palpations: Abdomen is soft. There is no mass.     Tenderness: There is no abdominal tenderness.  Musculoskeletal:        General: Normal range of motion.     Right lower leg: No edema.     Left lower leg: No edema.  Neurological:     Mental Status: He is alert and oriented to person, place, and time.  Psychiatric:        Mood and Affect: Mood normal.     CMP Latest Ref Rng & Units 06/24/2019 10/21/2018 11/25/2017  Glucose 65 - 99 mg/dL 11/27/2017) 253(G) 644(I)  BUN 8 - 27 mg/dL 16 14 7   Creatinine 0.76 - 1.27 mg/dL 347(Q 2.59  Sodium 134 - 144 mmol/L 138 136 134(L)  Potassium 3.5 - 5.2 mmol/L 4.4 3.9 3.9  Chloride 96 - 106 mmol/L 98 98 102  CO2 20 - 29 mmol/L 26 24 22   Calcium 8.6 - 10.2 mg/dL 9.8 9.9 9.1  Total Protein 6.0 - 8.5 g/dL 7.5 8.1 -  Total Bilirubin 0.0 - 1.2 mg/dL 0.4 0.3 -  Alkaline Phos 39 - 117 IU/L 58 55 -  AST 0 - 40 IU/L 22 17 -  ALT 0 - 44 IU/L 29 31 -    Lipid Panel     Component Value Date/Time   CHOL 147 06/24/2019 1025   TRIG 70 06/24/2019 1025   HDL 45 06/24/2019 1025   CHOLHDL 3.3 06/24/2019 1025   CHOLHDL 3.2 07/21/2016 1051   VLDL 10 07/21/2016 1051   LDLCALC 88  06/24/2019 1025    CBC    Component Value Date/Time   WBC 8.1 11/25/2017 0407   RBC 4.82 11/25/2017 0407   HGB 13.5 11/25/2017 0407   HCT 39.1 11/25/2017 0407   PLT 211 11/25/2017 0407   MCV 81.1 11/25/2017 0407   MCH 28.0 11/25/2017 0407   MCHC 34.5 11/25/2017 0407  RDW 15.4 11/25/2017 0407   LYMPHSABS 1.8 10/20/2013 1250   MONOABS 0.7 10/20/2013 1250   EOSABS 0.1 10/20/2013 1250   BASOSABS 0.0 10/20/2013 1250    Lab Results  Component Value Date   HGBA1C 10.2 (A) 12/16/2019    The 10-year ASCVD risk score Mikey Bussing DC Jr., et al., 2013) is: 33.2%   Values used to calculate the score:     Age: 30 years     Sex: Male     Is Non-Hispanic African American: Yes     Diabetic: Yes     Tobacco smoker: No     Systolic Blood Pressure: 671 mmHg     Is BP treated: Yes     HDL Cholesterol: 45 mg/dL     Total Cholesterol: 147 mg/dL  Assessment & Plan:  1. Type 2 diabetes mellitus with other neurologic complication, without long-term current use of insulin ([place]) Uncontrolled with A1c of 10.2 I have spoken with the clinical pharmacist and we will be able to provide Victoza in the [facility] pharmacy as he is unable to afford Victoza due to lack of Part D coverage He is strongly opposed to insulin I have discussed with him insulin will need to be added if we are unable to achieve glycemic control Counseled on Diabetic diet, my plate method, 245 minutes of moderate intensity exercise/week Blood sugar logs with fasting goals of 80-120 mg/dl, random of less than 180 and in the event of sugars less than 60 mg/dl or greater than 400 mg/dl encouraged to notify the clinic. Advised on the need for annual eye exams, annual foot exams, Pneumonia vaccine. - POCT glucose (manual entry) - POCT glycosylated hemoglobin (Hb A1C) - liraglutide (VICTOZA) 18 MG/3ML SOPN; Inject subcutaneously daily 0.6 mg for 1 week then 1.2 mg for 1 week then 1.8 mg thereafter.  Dispense: 30 mL; Refill: 3 - glipiZIDE  (GLUCOTROL) 10 MG tablet; Take 1 tablet (10 mg total) by mouth 2 (two) times daily before a meal.  Dispense: 180 tablet; Refill: 1 - gabapentin (NEURONTIN) 300 MG capsule; Take 1 capsule (300 mg total) by mouth at bedtime.  Dispense: 90 capsule; Refill: 1 - Microalbumin / creatinine urine ratio  2. Essential hypertension Uncontrolled Increase carvedilol from 12.5 to 25 mg twice daily - lisinopril-hydrochlorothiazide (ZESTORETIC) 20-12.5 MG tablet; Take 2 tablets by mouth daily.  Dispense: 180 tablet; Refill: 1 - cloNIDine (CATAPRES) 0.3 MG tablet; Take 1 tablet (0.3 mg total) by mouth 2 (two) times daily.  Dispense: 180 tablet; Refill: 1 - carvedilol (COREG) 25 MG tablet; Take 1 tablet (25 mg total) by mouth 2 (two) times daily with a meal.  Dispense: 180 tablet; Refill: 1 - amLODipine (NORVASC) 10 MG tablet; Take 1 tablet (10 mg total) by mouth daily.  Dispense: 90 tablet; Refill: 1 - Basic Metabolic Panel  3. Hyperlipidemia associated with type 2 diabetes mellitus (HCC) Controlled Low-cholesterol diet - atorvastatin (LIPITOR) 40 MG tablet; Take 1 tablet (40 mg total) by mouth daily.  Dispense: 90 tablet; Refill: 1  4. Morbid obesity ([place]) Advised on reducing portion sizes, increasing physical activity with a goal of weight loss     Charlott Rakes, MD, FAAFP. [facility] and [facility] [place], [place]   12/16/2019, 2:11 PM

## 2019-12-17 LAB — MICROALBUMIN / CREATININE URINE RATIO
Creatinine, Urine: 87.1 mg/dL
Microalb/Creat Ratio: 22 mg/g creat (ref 0–29)
Microalbumin, Urine: 18.9 ug/mL

## 2019-12-17 LAB — BASIC METABOLIC PANEL
BUN/Creatinine Ratio: 13 (ref 10–24)
BUN: 14 mg/dL (ref 8–27)
CO2: 25 mmol/L (ref 20–29)
Calcium: 9.7 mg/dL (ref 8.6–10.2)
Chloride: 97 mmol/L (ref 96–106)
Creatinine, Ser: 1.07 mg/dL (ref 0.76–1.27)
GFR calc Af Amer: 86 mL/min/{1.73_m2} (ref >59)
GFR calc non Af Amer: 74 mL/min/{1.73_m2} (ref >59)
Glucose: 225 mg/dL — ABNORMAL HIGH (ref 65–99)
Potassium: 4.5 mmol/L (ref 3.5–5.2)
Sodium: 136 mmol/L (ref 134–144)

## 2019-12-18 ENCOUNTER — Other Ambulatory Visit: Payer: Self-pay | Admitting: Pharmacist

## 2019-12-18 ENCOUNTER — Telehealth: Payer: Self-pay

## 2019-12-18 DIAGNOSIS — E119 Type 2 diabetes mellitus without complications: Secondary | ICD-10-CM

## 2019-12-18 MED ORDER — TRUEPLUS LANCETS 28G MISC: 11 refills | Status: DC

## 2019-12-18 MED ORDER — GLUCOSE BLOOD VI STRP: ORAL_STRIP | 12 refills | Status: DC

## 2019-12-18 MED ORDER — TRUE METRIX METER W/DEVICE KIT: PACK | 0 refills | Status: AC

## 2019-12-18 MED ORDER — TRUE METRIX BLOOD GLUCOSE TEST VI STRP: ORAL_STRIP | 6 refills | Status: DC

## 2019-12-18 MED FILL — TRUEplus LANCETS 28G MISC: 30 days supply | Qty: 100 | Fill #0

## 2019-12-18 MED FILL — !TRUE METRIX BLOOD GLUCOSE: 365 days supply | Qty: 1 | Fill #0

## 2019-12-18 MED FILL — TRUE METRIX TEST STRIP: 30 days supply | Qty: 100 | Fill #0

## 2019-12-18 NOTE — Telephone Encounter (Signed)
Patient name and DOB has been verified Patient was informed of lab results. Patient had no questions.  

## 2019-12-18 NOTE — Telephone Encounter (Signed)
-----   Message from Hoy Register, MD sent at 12/17/2019  5:20 PM EDT ----- Labs are stable except for elevated glucose.  Please advise him to adhere to the new regimen discussed at his office visit yesterday.

## 2020-01-06 ENCOUNTER — Ambulatory Visit: Payer: Medicare Other | Admitting: Pharmacist

## 2020-03-22 ENCOUNTER — Other Ambulatory Visit: Payer: Self-pay | Admitting: Family Medicine

## 2020-03-22 MED FILL — TRUEplus LANCETS 28G MISC: 30 days supply | Qty: 100 | Fill #1

## 2020-03-22 MED FILL — TRUE METRIX TEST STRIP: 30 days supply | Qty: 100 | Fill #1

## 2020-03-22 NOTE — Telephone Encounter (Signed)
Medication: glucose blood (TRUE METRIX BLOOD GLUCOSE TEST) test strip [[id number]] , true metrix lancets  Has the patient contacted their pharmacy? YES (Agent: If no, request that the patient contact the pharmacy for the refill.) (Agent: If yes, when and what did the pharmacy advise?)  Preferred Pharmacy (with phone number or street name): [facility] - [place], [place] - [address]  Phone:  [phone] Fax:  [phone]     Agent: Please be advised that RX refills may take up to 3 business days. We ask that you follow-up with your pharmacy.

## 2020-05-04 DIAGNOSIS — Z23 Encounter for immunization: Secondary | ICD-10-CM | POA: Diagnosis not present

## 2020-05-31 DIAGNOSIS — Z23 Encounter for immunization: Secondary | ICD-10-CM | POA: Diagnosis not present

## 2020-06-21 ENCOUNTER — Other Ambulatory Visit: Payer: Self-pay | Admitting: Family Medicine

## 2020-06-21 DIAGNOSIS — I10 Essential (primary) hypertension: Secondary | ICD-10-CM

## 2020-06-21 DIAGNOSIS — E1142 Type 2 diabetes mellitus with diabetic polyneuropathy: Secondary | ICD-10-CM

## 2020-06-21 NOTE — Telephone Encounter (Signed)
Requested Prescriptions  Pending Prescriptions Disp Refills  . glipiZIDE (GLUCOTROL) 10 MG tablet [Pharmacy Med Name: glipiZIDE Oral Tablet 10 MG] 60 tablet 0    Sig: TAKE ONE TABLET BY MOUTH TWICE DAILY BEFORE MEALS     Endocrinology:  Diabetes - Sulfonylureas Failed - 06/21/2020 11:34 AM      Failed - HBA1C is between 0 and 7.9 and within 180 days    HbA1c, POC (controlled diabetic range)  Date Value Ref Range Status  12/16/2019 10.2 (A) 0.0 - 7.0 % Final         Failed - Valid encounter within last 6 months    Recent Outpatient Visits          6 months ago Type 2 diabetes mellitus with diabetic polyneuropathy, without long-term current use of insulin (HCC)   [facility] [place], [place], MD   1 year ago Type 2 diabetes mellitus with other neurologic complication, without long-term current use of insulin (HCC)   [facility] Anthony Register, MD   1 year ago Essential hypertension   [facility] [place], Shea Stakes, NP   1 year ago Medication management   [facility] [place], Cornelius Moras, RPH-CPP   1 year ago Type 2 diabetes mellitus without complication, without long-term current use of insulin (HCC)   [facility] Anthony Register, MD      Future Appointments            In 3 weeks Anthony Register, MD [facility]           . lisinopril-hydrochlorothiazide (ZESTORETIC) 20-12.5 MG tablet [Pharmacy Med Name: Lisinopril-hydroCHLOROthiazide Oral Tablet 20-12.5 MG] 60 tablet 0    Sig: TAKE TWO TABLETS BY MOUTH DAILY     Cardiovascular:  ACEI + Diuretic Combos Failed - 06/21/2020 11:34 AM      Failed - Na in normal range and within 180 days    Sodium  Date Value Ref Range Status  12/16/2019 136 134 - 144 mmol/L Final         Failed - K in normal range and within 180 days    Potassium  Date Value Ref  Range Status  12/16/2019 4.5 3.5 - 5.2 mmol/L Final         Failed - Cr in normal range and within 180 days    Creat  Date Value Ref Range Status  07/21/2016 1.11 0.70 - 1.33 mg/dL Final    Comment:      For patients > or = 62 years of age: The upper reference limit for Creatinine is approximately 13% higher for people identified as African-American.      Creatinine, Ser  Date Value Ref Range Status  12/16/2019 1.07 0.76 - 1.27 mg/dL Final   Creatinine, Urine  Date Value Ref Range Status  10/20/2013 113.6 mg/dL Final         Failed - Ca in normal range and within 180 days    Calcium  Date Value Ref Range Status  12/16/2019 9.7 8.6 - 10.2 mg/dL Final         Failed - Last BP in normal range    BP Readings from Last 1 Encounters:  12/16/19 (!) 155/91         Failed - Valid encounter within last 6 months    Recent Outpatient Visits  6 months ago Type 2 diabetes mellitus with diabetic polyneuropathy, without long-term current use of insulin (HCC)   [facility] [place], [place], MD   1 year ago Type 2 diabetes mellitus with other neurologic complication, without long-term current use of insulin (HCC)   [facility] Anthony Register, MD   1 year ago Essential hypertension   [facility] [facility] Claiborne Rigg, NP   1 year ago Medication management   [facility] Drucilla Chalet, RPH-CPP   1 year ago Type 2 diabetes mellitus without complication, without long-term current use of insulin (HCC)   [facility] Anthony Register, MD      Future Appointments            In 3 weeks Anthony Register, MD [facility]           Passed - Patient is not pregnant      . cloNIDine (CATAPRES) 0.3 MG tablet [Pharmacy Med Name: cloNIDine HCl Oral Tablet 0.3 MG] 60 tablet 0    Sig: TAKE ONE TABLET BY  MOUTH TWICE DAILY     Cardiovascular:  Alpha-2 Agonists Failed - 06/21/2020 11:34 AM      Failed - Last BP in normal range    BP Readings from Last 1 Encounters:  12/16/19 (!) 155/91         Failed - Valid encounter within last 6 months    Recent Outpatient Visits          6 months ago Type 2 diabetes mellitus with diabetic polyneuropathy, without long-term current use of insulin (HCC)   [facility] [place], [place], MD   1 year ago Type 2 diabetes mellitus with other neurologic complication, without long-term current use of insulin (HCC)   [facility] Anthony Register, MD   1 year ago Essential hypertension   [facility] Claiborne Rigg, NP   1 year ago Medication management   [facility] [place], Cornelius Moras, RPH-CPP   1 year ago Type 2 diabetes mellitus without complication, without long-term current use of insulin (HCC)   [facility] Anthony Register, MD      Future Appointments            In 3 weeks Anthony Register, MD [facility]           Passed - Last Heart Rate in normal range    Pulse Readings from Last 1 Encounters:  12/16/19 71         . amLODipine (NORVASC) 10 MG tablet [Pharmacy Med Name: amLODIPine Besylate Oral Tablet 10 MG] 30 tablet 0    Sig: TAKE ONE TABLET BY MOUTH ONE TIME DAILY     Cardiovascular:  Calcium Channel Blockers Failed - 06/21/2020 11:34 AM      Failed - Last BP in normal range    BP Readings from Last 1 Encounters:  12/16/19 (!) 155/91         Failed - Valid encounter within last 6 months    Recent Outpatient Visits          6 months ago Type 2 diabetes mellitus with diabetic polyneuropathy, without long-term current use of insulin (HCC)    [facility]  1 year ago Type 2 diabetes mellitus with other  neurologic complication, without long-term current use of insulin (HCC)   [facility] Anthony Register, MD   1 year ago Essential hypertension   [facility] Claiborne Rigg, NP   1 year ago Medication management   [facility] Drucilla Chalet, RPH-CPP   1 year ago Type 2 diabetes mellitus without complication, without long-term current use of insulin (HCC)   [facility] Anthony Register, MD      Future Appointments            In 3 weeks Anthony Register, MD [facility]

## 2020-07-07 MED FILL — TRUE METRIX TEST STRIP: 30 days supply | Qty: 100 | Fill #2

## 2020-07-13 ENCOUNTER — Ambulatory Visit: Payer: Medicare Other | Attending: Family Medicine | Admitting: Family Medicine

## 2020-07-13 ENCOUNTER — Encounter: Payer: Self-pay | Admitting: Family Medicine

## 2020-07-13 ENCOUNTER — Other Ambulatory Visit: Payer: Self-pay

## 2020-07-13 VITALS — BP 167/97 | HR 82 | Ht 76.0 in | Wt 320.0 lb

## 2020-07-13 DIAGNOSIS — E785 Hyperlipidemia, unspecified: Secondary | ICD-10-CM | POA: Diagnosis not present

## 2020-07-13 DIAGNOSIS — Z7984 Long term (current) use of oral hypoglycemic drugs: Secondary | ICD-10-CM | POA: Diagnosis not present

## 2020-07-13 DIAGNOSIS — E1169 Type 2 diabetes mellitus with other specified complication: Secondary | ICD-10-CM | POA: Diagnosis not present

## 2020-07-13 DIAGNOSIS — E1149 Type 2 diabetes mellitus with other diabetic neurological complication: Secondary | ICD-10-CM

## 2020-07-13 DIAGNOSIS — I152 Hypertension secondary to endocrine disorders: Secondary | ICD-10-CM

## 2020-07-13 DIAGNOSIS — E1159 Type 2 diabetes mellitus with other circulatory complications: Secondary | ICD-10-CM

## 2020-07-13 DIAGNOSIS — I10 Essential (primary) hypertension: Secondary | ICD-10-CM | POA: Insufficient documentation

## 2020-07-13 DIAGNOSIS — Z79899 Other long term (current) drug therapy: Secondary | ICD-10-CM | POA: Diagnosis not present

## 2020-07-13 LAB — POCT GLYCOSYLATED HEMOGLOBIN (HGB A1C): HbA1c, POC (controlled diabetic range): 7.7 % — AB (ref 0.0–7.0)

## 2020-07-13 LAB — GLUCOSE, POCT (MANUAL RESULT ENTRY): POC Glucose: 154 mg/dl — AB (ref 70–99)

## 2020-07-13 MED ORDER — CARVEDILOL 25 MG PO TABS: 25.0000 mg | ORAL_TABLET | Freq: Two times a day (BID) | ORAL | 1 refills | Status: DC

## 2020-07-13 MED ORDER — GLIPIZIDE 10 MG PO TABS: ORAL_TABLET | ORAL | 1 refills | Status: DC

## 2020-07-13 MED ORDER — SPIRONOLACTONE 25 MG PO TABS: 25.0000 mg | ORAL_TABLET | Freq: Every day | ORAL | 1 refills | Status: DC

## 2020-07-13 MED ORDER — EMPAGLIFLOZIN 10 MG PO TABS: 10.0000 mg | ORAL_TABLET | Freq: Every day | ORAL | 1 refills | Status: DC

## 2020-07-13 MED ORDER — AMLODIPINE BESYLATE 10 MG PO TABS: 10.0000 mg | ORAL_TABLET | Freq: Every day | ORAL | 1 refills | Status: DC

## 2020-07-13 MED ORDER — LISINOPRIL-HYDROCHLOROTHIAZIDE 20-12.5 MG PO TABS: 2.0000 | ORAL_TABLET | Freq: Every day | ORAL | 1 refills | Status: DC

## 2020-07-13 MED ORDER — CLONIDINE HCL 0.3 MG PO TABS: 0.3000 mg | ORAL_TABLET | Freq: Two times a day (BID) | ORAL | 1 refills | Status: DC

## 2020-07-13 MED ORDER — ATORVASTATIN CALCIUM 40 MG PO TABS: 40.0000 mg | ORAL_TABLET | Freq: Every day | ORAL | 1 refills | Status: DC

## 2020-07-13 NOTE — Patient Instructions (Signed)
DASH Eating Plan DASH stands for "Dietary Approaches to Stop Hypertension." The DASH eating plan is a healthy eating plan that has been shown to reduce high blood pressure (hypertension). It may also reduce your risk for type 2 diabetes, heart disease, and stroke. The DASH eating plan may also help with weight loss. What are tips for following this plan?  General guidelines  Avoid eating more than 2,300 mg (milligrams) of salt (sodium) a day. If you have hypertension, you may need to reduce your sodium intake to 1,500 mg a day.  Limit alcohol intake to no more than 1 drink a day for nonpregnant women and 2 drinks a day for men. One drink equals 12 oz of beer, 5 oz of wine, or 1 oz of hard liquor.  Work with your health care provider to maintain a healthy body weight or to lose weight. Ask what an ideal weight is for you.  Get at least 30 minutes of exercise that causes your heart to beat faster (aerobic exercise) most days of the week. Activities may include walking, swimming, or biking.  Work with your health care provider or diet and nutrition specialist (dietitian) to adjust your eating plan to your individual calorie needs. Reading food labels   Check food labels for the amount of sodium per serving. Choose foods with less than 5 percent of the Daily Value of sodium. Generally, foods with less than 300 mg of sodium per serving fit into this eating plan.  To find whole grains, look for the word "whole" as the first word in the ingredient list. Shopping  Buy products labeled as "low-sodium" or "no salt added."  Buy fresh foods. Avoid canned foods and premade or frozen meals. Cooking  Avoid adding salt when cooking. Use salt-free seasonings or herbs instead of table salt or sea salt. Check with your health care provider or pharmacist before using salt substitutes.  Do not fry foods. Cook foods using healthy methods such as baking, boiling, grilling, and broiling instead.  Cook with  heart-healthy oils, such as olive, canola, soybean, or sunflower oil. Meal planning  Eat a balanced diet that includes: ? 5 or more servings of fruits and vegetables each day. At each meal, try to fill half of your plate with fruits and vegetables. ? Up to 6-8 servings of whole grains each day. ? Less than 6 oz of lean meat, poultry, or fish each day. A 3-oz serving of meat is about the same size as a deck of cards. One egg equals 1 oz. ? 2 servings of low-fat dairy each day. ? A serving of nuts, seeds, or beans 5 times each week. ? Heart-healthy fats. Healthy fats called Omega-3 fatty acids are found in foods such as flaxseeds and coldwater fish, like sardines, salmon, and mackerel.  Limit how much you eat of the following: ? Canned or prepackaged foods. ? Food that is high in trans fat, such as fried foods. ? Food that is high in saturated fat, such as fatty meat. ? Sweets, desserts, sugary drinks, and other foods with added sugar. ? Full-fat dairy products.  Do not salt foods before eating.  Try to eat at least 2 vegetarian meals each week.  Eat more home-cooked food and less restaurant, buffet, and fast food.  When eating at a restaurant, ask that your food be prepared with less salt or no salt, if possible. What foods are recommended? The items listed may not be a complete list. Talk with your dietitian about   what dietary choices are best for you. Grains Whole-grain or whole-wheat bread. Whole-grain or whole-wheat pasta. Brown rice. Oatmeal. Quinoa. Bulgur. Whole-grain and low-sodium cereals. Pita bread. Low-fat, low-sodium crackers. Whole-wheat flour tortillas. Vegetables Fresh or frozen vegetables (raw, steamed, roasted, or grilled). Low-sodium or reduced-sodium tomato and vegetable juice. Low-sodium or reduced-sodium tomato sauce and tomato paste. Low-sodium or reduced-sodium canned vegetables. Fruits All fresh, dried, or frozen fruit. Canned fruit in natural juice (without  added sugar). Meat and other protein foods Skinless chicken or turkey. Ground chicken or turkey. Pork with fat trimmed off. Fish and seafood. Egg whites. Dried beans, peas, or lentils. Unsalted nuts, nut butters, and seeds. Unsalted canned beans. Lean cuts of beef with fat trimmed off. Low-sodium, lean deli meat. Dairy Low-fat (1%) or fat-free (skim) milk. Fat-free, low-fat, or reduced-fat cheeses. Nonfat, low-sodium ricotta or cottage cheese. Low-fat or nonfat yogurt. Low-fat, low-sodium cheese. Fats and oils Soft margarine without trans fats. Vegetable oil. Low-fat, reduced-fat, or light mayonnaise and salad dressings (reduced-sodium). Canola, safflower, olive, soybean, and sunflower oils. Avocado. Seasoning and other foods Herbs. Spices. Seasoning mixes without salt. Unsalted popcorn and pretzels. Fat-free sweets. What foods are not recommended? The items listed may not be a complete list. Talk with your dietitian about what dietary choices are best for you. Grains Baked goods made with fat, such as croissants, muffins, or some breads. Dry pasta or rice meal packs. Vegetables Creamed or fried vegetables. Vegetables in a cheese sauce. Regular canned vegetables (not low-sodium or reduced-sodium). Regular canned tomato sauce and paste (not low-sodium or reduced-sodium). Regular tomato and vegetable juice (not low-sodium or reduced-sodium). Pickles. Olives. Fruits Canned fruit in a light or heavy syrup. Fried fruit. Fruit in cream or butter sauce. Meat and other protein foods Fatty cuts of meat. Ribs. Fried meat. Bacon. Sausage. Bologna and other processed lunch meats. Salami. Fatback. Hotdogs. Bratwurst. Salted nuts and seeds. Canned beans with added salt. Canned or smoked fish. Whole eggs or egg yolks. Chicken or turkey with skin. Dairy Whole or 2% milk, cream, and half-and-half. Whole or full-fat cream cheese. Whole-fat or sweetened yogurt. Full-fat cheese. Nondairy creamers. Whipped toppings.  Processed cheese and cheese spreads. Fats and oils Butter. Stick margarine. Lard. Shortening. Ghee. Bacon fat. Tropical oils, such as coconut, palm kernel, or palm oil. Seasoning and other foods Salted popcorn and pretzels. Onion salt, garlic salt, seasoned salt, table salt, and sea salt. Worcestershire sauce. Tartar sauce. Barbecue sauce. Teriyaki sauce. Soy sauce, including reduced-sodium. Steak sauce. Canned and packaged gravies. Fish sauce. Oyster sauce. Cocktail sauce. Horseradish that you find on the shelf. Ketchup. Mustard. Meat flavorings and tenderizers. Bouillon cubes. Hot sauce and Tabasco sauce. Premade or packaged marinades. Premade or packaged taco seasonings. Relishes. Regular salad dressings. Where to find more information:  National Heart, Lung, and Blood Institute: www.nhlbi.nih.gov  American Heart Association: www.heart.org Summary  The DASH eating plan is a healthy eating plan that has been shown to reduce high blood pressure (hypertension). It may also reduce your risk for type 2 diabetes, heart disease, and stroke.  With the DASH eating plan, you should limit salt (sodium) intake to 2,300 mg a day. If you have hypertension, you may need to reduce your sodium intake to 1,500 mg a day.  When on the DASH eating plan, aim to eat more fresh fruits and vegetables, whole grains, lean proteins, low-fat dairy, and heart-healthy fats.  Work with your health care provider or diet and nutrition specialist (dietitian) to adjust your eating plan to your   individual calorie needs. This information is not intended to replace advice given to you by your health care provider. Make sure you discuss any questions you have with your health care provider. Document Revised: 07/13/2017 Document Reviewed: 07/24/2016 Elsevier Patient Education  2020 Elsevier Inc.  

## 2020-07-13 NOTE — Progress Notes (Signed)
Subjective:  Patient ID: Anthony Barnes, male    DOB: [birth year]  Age: 64 y.o. MRN: [id number]  CC: Diabetes   Anthony Barnes is a 64 year old male with a history of hypertension, type 2 diabetes mellitus A1c7.7, morbid obesityhere for follow-up visit. His A1c 7.7 down from 10.2 previously. He never started Victoza as he is opposed to injectables.  Jardiance appears on his med list but it appears he never picked it up.  He has been compliant with Glipizide.  He has lost 10 lbs in the last 6 months by working on a keto diet.   His blood pressure is elevated and he endorses compliance with his medications. He denies presence of chest pain, dyspnea Past Medical History:  Diagnosis Date  . Diabetes mellitus without complication ([place])   . Hypertension     Past Surgical History:  Procedure Laterality Date  . BRAIN SURGERY    . ESOPHAGOGASTRODUODENOSCOPY N/A 03/22/2013   Procedure: ESOPHAGOGASTRODUODENOSCOPY (EGD);  Surgeon: Wonda Horner, MD;  [facility]: [facility] ENDOSCOPY;  Service: Endoscopy;  Laterality: N/A;    Family History  Problem Relation Age of Onset  . Hypertension Mother   . Cancer Mother 11       ovarian  . Diabetes Father   . Stroke Father     Allergies  Allergen Reactions  . Shellfish Allergy Anaphylaxis  . Codeine Itching    unknown    Outpatient Medications Prior to Visit  Medication Sig Dispense Refill  . Blood Glucose Monitoring Suppl (TRUE METRIX METER) w/Device KIT Check blood sugar 3 times daily. 1 kit 0  . gabapentin (NEURONTIN) 300 MG capsule Take 1 capsule (300 mg total) by mouth at bedtime. 90 capsule 1  . glucose blood (TRUE METRIX BLOOD GLUCOSE TEST) test strip Check blood sugar 3 times daily. 100 each 6  . ibuprofen (ADVIL,MOTRIN) 800 MG tablet Take 1 tablet (800 mg total) by mouth every 12 (twelve) hours as needed. 30 tablet 0  . Multiple Vitamin (MULTIVITAMIN WITH MINERALS) TABS tablet Take 1 tablet by mouth daily.    Marland Kitchen omega-3 acid  ethyl esters (LOVAZA) 1 g capsule Take 1 g by mouth 2 (two) times daily.    . TRUEplus Lancets 28G MISC Check blood sugar 3 times daily. 100 each 11  . amLODipine (NORVASC) 10 MG tablet TAKE ONE TABLET BY MOUTH ONE TIME DAILY 30 tablet 0  . atorvastatin (LIPITOR) 40 MG tablet Take 1 tablet (40 mg total) by mouth daily. 90 tablet 1  . carvedilol (COREG) 25 MG tablet Take 1 tablet (25 mg total) by mouth 2 (two) times daily with a meal. 180 tablet 1  . cloNIDine (CATAPRES) 0.3 MG tablet TAKE ONE TABLET BY MOUTH TWICE DAILY 60 tablet 0  . glipiZIDE (GLUCOTROL) 10 MG tablet TAKE ONE TABLET BY MOUTH TWICE DAILY BEFORE MEALS 60 tablet 0  . lisinopril-hydrochlorothiazide (ZESTORETIC) 20-12.5 MG tablet TAKE TWO TABLETS BY MOUTH DAILY 60 tablet 0  . empagliflozin (JARDIANCE) 10 MG TABS tablet Take 10 mg by mouth daily before breakfast. (Patient not taking: Reported on 12/16/2019) 90 tablet 1  . liraglutide (VICTOZA) 18 MG/3ML SOPN Inject subcutaneously daily 0.6 mg for 1 week then 1.2 mg for 1 week then 1.8 mg thereafter. (Patient not taking: Reported on 07/13/2020) 30 mL 3   No facility-administered medications prior to visit.     ROS Review of Systems  Constitutional: Negative for activity change and appetite change.  HENT: Negative for sinus pressure and sore  throat.   Eyes: Negative for visual disturbance.  Respiratory: Negative for cough, chest tightness and shortness of breath.   Cardiovascular: Negative for chest pain and leg swelling.  Gastrointestinal: Negative for abdominal distention, abdominal pain, constipation and diarrhea.  Endocrine: Negative.   Genitourinary: Negative for dysuria.  Musculoskeletal: Negative for joint swelling and myalgias.  Skin: Negative for rash.  Allergic/Immunologic: Negative.   Neurological: Negative for weakness, light-headedness and numbness.  Psychiatric/Behavioral: Negative for dysphoric mood and suicidal ideas.    Objective:  BP (!) 167/97   Pulse 82    Ht 6' 4" (1.93 m)   Wt (!) 320 lb (145.2 kg)   SpO2 99%   BMI 38.95 kg/m   BP/Weight 07/13/2020 12/16/2019 55/02/3219  Systolic BP 254 270 623  Diastolic BP 97 91 98  Wt. (Lbs) 320 330 322  BMI 38.95 40.17 39.2      Physical Exam Constitutional:      Appearance: He is well-developed.  Neck:     Vascular: No JVD.  Cardiovascular:     Rate and Rhythm: Normal rate.     Heart sounds: Normal heart sounds. No murmur heard.   Pulmonary:     Effort: Pulmonary effort is normal.     Breath sounds: Normal breath sounds. No wheezing or rales.  Chest:     Chest wall: No tenderness.  Abdominal:     General: Bowel sounds are normal. There is no distension.     Palpations: Abdomen is soft. There is no mass.     Tenderness: There is no abdominal tenderness.  Musculoskeletal:        General: Normal range of motion.     Right lower leg: No edema.     Left lower leg: No edema.  Neurological:     Mental Status: He is alert and oriented to person, place, and time.  Psychiatric:        Mood and Affect: Mood normal.     CMP Latest Ref Rng & Units 12/16/2019 06/24/2019 10/21/2018  Glucose 65 - 99 mg/dL 225(H) 217(H) 138(H)  BUN 8 - 27 mg/dL _0 Creatinine 0.76 - 1.27 mg/dL 1.07 1.11 0.89  Sodium 134 - 144 mmol/L 136 138 136  Potassium 3.5 - 5.2 mmol/L 4.5 4.4 3.9  Chloride 96 - 106 mmol/L 97 98 98  CO2 20 - 29 mmol/L _1 Calcium 8.6 - 10.2 mg/dL 9.7 9.8 9.9  Total Protein 6.0 - 8.5 g/dL - 7.5 8.1  Total Bilirubin 0.0 - 1.2 mg/dL - 0.4 0.3  Alkaline Phos 39 - 117 IU/L - 58 55  AST 0 - 40 IU/L - 22 17  ALT 0 - 44 IU/L - 29 31    Lipid Panel     Component Value Date/Time   CHOL 147 06/24/2019 1025   TRIG 70 06/24/2019 1025   HDL 45 06/24/2019 1025   CHOLHDL 3.3 06/24/2019 1025   CHOLHDL 3.2 07/21/2016 1051   VLDL 10 07/21/2016 1051   LDLCALC 88 06/24/2019 1025    CBC    Component Value Date/Time   WBC 8.1 11/25/2017 0407   RBC 4.82 11/25/2017 0407   HGB 13.5  11/25/2017 0407   HCT 39.1 11/25/2017 0407   PLT 211 11/25/2017 0407   MCV 81.1 11/25/2017 0407   MCH 28.0 11/25/2017 0407   MCHC 34.5 11/25/2017 0407   RDW 15.4 11/25/2017 0407   LYMPHSABS 1.8 10/20/2013 1250   MONOABS 0.7 10/20/2013 1250  EOSABS 0.1 10/20/2013 1250   BASOSABS 0.0 10/20/2013 1250    Lab Results  Component Value Date   HGBA1C 7.7 (A) 07/13/2020    Assessment & Plan:  1. Type 2 diabetes mellitus with other neurologic complication, without long-term current use of insulin (HCC) Commended on improvement in A1c which is at 7.7; goal is less than 7.0 Prescription for Jardiance sent to pharmacy Discontinued Victoza since he never picked this up and is opposed to injectables Counseled on Diabetic diet, my plate method, 696 minutes of moderate intensity exercise/week Blood sugar logs with fasting goals of 80-120 mg/dl, random of less than 180 and in the event of sugars less than 60 mg/dl or greater than 400 mg/dl encouraged to notify the clinic. Advised on the need for annual eye exams, annual foot exams, Pneumonia vaccine. - POCT glucose (manual entry) - POCT glycosylated hemoglobin (Hb A1C) - empagliflozin (JARDIANCE) 10 MG TABS tablet; Take 1 tablet (10 mg total) by mouth daily before breakfast.  Dispense: 90 tablet; Refill: 1 - glipiZIDE (GLUCOTROL) 10 MG tablet; TAKE ONE TABLET BY MOUTH TWICE DAILY BEFORE MEALS  Dispense: 180 tablet; Refill: 1 - Lipid panel; Future - CMP14+EGFR; Future  2. Hypertension associated with diabetes ([place]) Uncontrolled Spironolactone added to regimen Counseled on blood pressure goal of less than 130/80, low-sodium, DASH diet, medication compliance, 150 minutes of moderate intensity exercise per week. Discussed medication compliance, adverse effects. - carvedilol (COREG) 25 MG tablet; Take 1 tablet (25 mg total) by mouth 2 (two) times daily with a meal.  Dispense: 180 tablet; Refill: 1 - amLODipine (NORVASC) 10 MG tablet; Take 1 tablet  (10 mg total) by mouth daily.  Dispense: 90 tablet; Refill: 1 - lisinopril-hydrochlorothiazide (ZESTORETIC) 20-12.5 MG tablet; Take 2 tablets by mouth daily.  Dispense: 180 tablet; Refill: 1 - spironolactone (ALDACTONE) 25 MG tablet; Take 1 tablet (25 mg total) by mouth daily.  Dispense: 90 tablet; Refill: 1 - cloNIDine (CATAPRES) 0.3 MG tablet; Take 1 tablet (0.3 mg total) by mouth 2 (two) times daily.  Dispense: 180 tablet; Refill: 1  3. Hyperlipidemia associated with type 2 diabetes mellitus (HCC) Controlled Low-cholesterol diet - atorvastatin (LIPITOR) 40 MG tablet; Take 1 tablet (40 mg total) by mouth daily.  Dispense: 90 tablet; Refill: 1    Meds ordered this encounter  Medications  . empagliflozin (JARDIANCE) 10 MG TABS tablet    Sig: Take 1 tablet (10 mg total) by mouth daily before breakfast.    Dispense:  90 tablet    Refill:  1  . carvedilol (COREG) 25 MG tablet    Sig: Take 1 tablet (25 mg total) by mouth 2 (two) times daily with a meal.    Dispense:  180 tablet    Refill:  1    Dispense 90-day supply; dose change  . amLODipine (NORVASC) 10 MG tablet    Sig: Take 1 tablet (10 mg total) by mouth daily.    Dispense:  90 tablet    Refill:  1    Courtesy refill; visit needed for additional refills  . glipiZIDE (GLUCOTROL) 10 MG tablet    Sig: TAKE ONE TABLET BY MOUTH TWICE DAILY BEFORE MEALS    Dispense:  180 tablet    Refill:  1  . lisinopril-hydrochlorothiazide (ZESTORETIC) 20-12.5 MG tablet    Sig: Take 2 tablets by mouth daily.    Dispense:  180 tablet    Refill:  1  . spironolactone (ALDACTONE) 25 MG tablet    Sig: Take 1  tablet (25 mg total) by mouth daily.    Dispense:  90 tablet    Refill:  1  . atorvastatin (LIPITOR) 40 MG tablet    Sig: Take 1 tablet (40 mg total) by mouth daily.    Dispense:  90 tablet    Refill:  1  . cloNIDine (CATAPRES) 0.3 MG tablet    Sig: Take 1 tablet (0.3 mg total) by mouth 2 (two) times daily.    Dispense:  180 tablet     Refill:  1    Follow-up: Return in about 3 months (around 10/11/2020) for Chronic disease management.       Enobong Newlin, MD, FAAFP. [facility] [place], [place] [phone]   07/13/2020, 5:55 PM 

## 2020-07-20 ENCOUNTER — Ambulatory Visit: Payer: Medicare Other | Attending: Family Medicine

## 2020-07-20 ENCOUNTER — Other Ambulatory Visit: Payer: Self-pay

## 2020-07-20 DIAGNOSIS — E1149 Type 2 diabetes mellitus with other diabetic neurological complication: Secondary | ICD-10-CM

## 2020-07-21 LAB — CMP14+EGFR
ALT: 20 IU/L (ref 0–44)
AST: 16 IU/L (ref 0–40)
Albumin/Globulin Ratio: 1.6 (ref 1.2–2.2)
Albumin: 4.5 g/dL (ref 3.8–4.8)
Alkaline Phosphatase: 46 IU/L (ref 44–121)
BUN/Creatinine Ratio: 16 (ref 10–24)
BUN: 16 mg/dL (ref 8–27)
Bilirubin Total: 0.4 mg/dL (ref 0.0–1.2)
CO2: 24 mmol/L (ref 20–29)
Calcium: 9.7 mg/dL (ref 8.6–10.2)
Chloride: 101 mmol/L (ref 96–106)
Creatinine, Ser: 0.98 mg/dL (ref 0.76–1.27)
GFR calc Af Amer: 95 mL/min/{1.73_m2} (ref >59)
GFR calc non Af Amer: 82 mL/min/{1.73_m2} (ref >59)
Globulin, Total: 2.9 g/dL (ref 1.5–4.5)
Glucose: 159 mg/dL — ABNORMAL HIGH (ref 65–99)
Potassium: 4.5 mmol/L (ref 3.5–5.2)
Sodium: 138 mmol/L (ref 134–144)
Total Protein: 7.4 g/dL (ref 6.0–8.5)

## 2020-07-21 LAB — LIPID PANEL
Chol/HDL Ratio: 3.2 ratio (ref 0.0–5.0)
Cholesterol, Total: 156 mg/dL (ref 100–199)
HDL: 49 mg/dL (ref >39)
LDL Chol Calc (NIH): 97 mg/dL (ref 0–99)
Triglycerides: 48 mg/dL (ref 0–149)
VLDL Cholesterol Cal: 10 mg/dL (ref 5–40)

## 2020-07-23 ENCOUNTER — Telehealth: Payer: Self-pay

## 2020-07-23 NOTE — Telephone Encounter (Signed)
-----   Message from Hoy Register, MD sent at 07/21/2020 12:52 PM EST ----- Please inform the patient that labs are normal. Thank you.

## 2020-07-23 NOTE — Telephone Encounter (Signed)
Patient name and DOB has been verified Patient was informed of lab results. Patient had no questions.  

## 2020-08-24 MED FILL — TRUE METRIX GLUCOSE TEST ST: 30 days supply | Qty: 100 | Fill #3

## 2020-08-24 MED FILL — TRUEplus LANCETS 28G MISC: 30 days supply | Qty: 100 | Fill #2

## 2020-10-19 ENCOUNTER — Ambulatory Visit: Payer: Medicare Other | Admitting: Family Medicine

## 2020-12-20 ENCOUNTER — Other Ambulatory Visit: Payer: Self-pay

## 2020-12-20 ENCOUNTER — Emergency Department (HOSPITAL_COMMUNITY)
Admission: EM | Admit: 2020-12-20 | Discharge: 2020-12-20 | Disposition: A | Discharge status: Home / Self Care | Payer: No Typology Code available for payment source | Attending: Emergency Medicine | Admitting: Emergency Medicine

## 2020-12-20 ENCOUNTER — Emergency Department (HOSPITAL_COMMUNITY): Payer: No Typology Code available for payment source

## 2020-12-20 ENCOUNTER — Encounter (HOSPITAL_COMMUNITY): Payer: Self-pay

## 2020-12-20 DIAGNOSIS — Z87891 Personal history of nicotine dependence: Secondary | ICD-10-CM | POA: Insufficient documentation

## 2020-12-20 DIAGNOSIS — R0781 Pleurodynia: Secondary | ICD-10-CM | POA: Diagnosis present

## 2020-12-20 DIAGNOSIS — K219 Gastro-esophageal reflux disease without esophagitis: Secondary | ICD-10-CM | POA: Insufficient documentation

## 2020-12-20 DIAGNOSIS — R21 Rash and other nonspecific skin eruption: Secondary | ICD-10-CM | POA: Diagnosis not present

## 2020-12-20 DIAGNOSIS — E1110 Type 2 diabetes mellitus with ketoacidosis without coma: Secondary | ICD-10-CM | POA: Diagnosis not present

## 2020-12-20 DIAGNOSIS — Z79899 Other long term (current) drug therapy: Secondary | ICD-10-CM | POA: Insufficient documentation

## 2020-12-20 DIAGNOSIS — Z7984 Long term (current) use of oral hypoglycemic drugs: Secondary | ICD-10-CM | POA: Insufficient documentation

## 2020-12-20 DIAGNOSIS — M7918 Myalgia, other site: Secondary | ICD-10-CM

## 2020-12-20 DIAGNOSIS — M542 Cervicalgia: Secondary | ICD-10-CM | POA: Insufficient documentation

## 2020-12-20 DIAGNOSIS — U071 COVID-19: Secondary | ICD-10-CM | POA: Diagnosis not present

## 2020-12-20 DIAGNOSIS — R109 Unspecified abdominal pain: Secondary | ICD-10-CM | POA: Insufficient documentation

## 2020-12-20 DIAGNOSIS — M25512 Pain in left shoulder: Secondary | ICD-10-CM | POA: Diagnosis not present

## 2020-12-20 DIAGNOSIS — Y92410 Unspecified street and highway as the place of occurrence of the external cause: Secondary | ICD-10-CM | POA: Insufficient documentation

## 2020-12-20 DIAGNOSIS — M25519 Pain in unspecified shoulder: Secondary | ICD-10-CM | POA: Diagnosis not present

## 2020-12-20 DIAGNOSIS — J9811 Atelectasis: Secondary | ICD-10-CM | POA: Diagnosis not present

## 2020-12-20 DIAGNOSIS — I10 Essential (primary) hypertension: Secondary | ICD-10-CM | POA: Diagnosis not present

## 2020-12-20 LAB — URINALYSIS, ROUTINE W REFLEX MICROSCOPIC
Bacteria, UA: NONE SEEN
Bilirubin Urine: NEGATIVE
Glucose, UA: 50 mg/dL — AB
Hgb urine dipstick: NEGATIVE
Ketones, ur: 5 mg/dL — AB
Leukocytes,Ua: NEGATIVE
Nitrite: NEGATIVE
Protein, ur: 30 mg/dL — AB
Specific Gravity, Urine: 1.019 (ref 1.005–1.030)
pH: 5 (ref 5.0–8.0)

## 2020-12-20 MED ORDER — ACETAMINOPHEN 500 MG PO TABS
1000.0000 mg | ORAL_TABLET | Freq: Once | ORAL | Status: AC
  Administered 2020-12-20: 1000 mg via ORAL
  Filled 2020-12-20: qty 2
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
## 2020-12-20 NOTE — Discharge Instructions (Signed)
All the x-rays look normal.  You did have a fever here and you were tested for COVID.  If you start having more symptoms, any difficulty breathing, vomiting or other issues please return to the emergency room.

## 2020-12-21 ENCOUNTER — Ambulatory Visit: Payer: Self-pay

## 2020-12-21 LAB — SARS CORONAVIRUS 2 (TAT 6-24 HRS): SARS Coronavirus 2: POSITIVE — AB
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
## 2020-12-22 NOTE — Telephone Encounter (Signed)
Can patient be placed in 810 or 130 slot to discuss.

## 2020-12-22 NOTE — Telephone Encounter (Signed)
Yes

## 2020-12-22 NOTE — Telephone Encounter (Signed)
Pt has been added to the schedule for a telephone visit.

## 2020-12-27 ENCOUNTER — Encounter: Payer: Self-pay | Admitting: Family Medicine

## 2020-12-27 ENCOUNTER — Ambulatory Visit: Payer: Medicare Other | Attending: Family Medicine | Admitting: Family Medicine

## 2020-12-27 ENCOUNTER — Other Ambulatory Visit: Payer: Self-pay

## 2020-12-27 DIAGNOSIS — M791 Myalgia, unspecified site: Secondary | ICD-10-CM | POA: Diagnosis not present

## 2020-12-27 DIAGNOSIS — U071 COVID-19: Secondary | ICD-10-CM | POA: Diagnosis not present

## 2020-12-27 MED ORDER — TIZANIDINE HCL 4 MG PO TABS: 4.0000 mg | ORAL_TABLET | Freq: Three times a day (TID) | ORAL | 0 refills | Status: DC | PRN
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
## 2021-01-14 ENCOUNTER — Other Ambulatory Visit: Payer: Self-pay | Admitting: Family Medicine

## 2021-01-14 DIAGNOSIS — E1159 Type 2 diabetes mellitus with other circulatory complications: Secondary | ICD-10-CM

## 2021-01-14 DIAGNOSIS — E1149 Type 2 diabetes mellitus with other diabetic neurological complication: Secondary | ICD-10-CM

## 2021-01-14 DIAGNOSIS — I152 Hypertension secondary to endocrine disorders: Secondary | ICD-10-CM

## 2021-01-14 MED ORDER — CLONIDINE HCL 0.3 MG PO TABS: 0.3000 mg | ORAL_TABLET | Freq: Two times a day (BID) | ORAL | 0 refills | Status: DC

## 2021-01-14 MED ORDER — CARVEDILOL 25 MG PO TABS: 25.0000 mg | ORAL_TABLET | Freq: Two times a day (BID) | ORAL | 0 refills | Status: DC

## 2021-01-14 MED ORDER — LISINOPRIL-HYDROCHLOROTHIAZIDE 20-12.5 MG PO TABS: 2.0000 | ORAL_TABLET | Freq: Every day | ORAL | 0 refills | Status: DC

## 2021-01-14 MED ORDER — GLIPIZIDE 10 MG PO TABS: ORAL_TABLET | ORAL | 0 refills | Status: DC

## 2021-01-14 MED ORDER — AMLODIPINE BESYLATE 10 MG PO TABS: 10.0000 mg | ORAL_TABLET | Freq: Every day | ORAL | 0 refills | Status: DC

## 2021-01-14 NOTE — Telephone Encounter (Signed)
Medication Refill - Medication: amLODipine (NORVASC) 10 MG tablet carvedilol (COREG) 25 MG tablet  cloNIDine (CATAPRES) 0.3 MG tablet glipiZIDE (GLUCOTROL) 10 MG tablet lisinopril-hydrochlorothiazide (ZESTORETIC) 20-12.5 MG tablet     Preferred Pharmacy (with phone number or street name): [id number] - [place], [place] - [address]  Agent: Please be advised that RX refills may take up to 3 business days. We ask that you follow-up with your pharmacy.

## 2021-02-21 ENCOUNTER — Other Ambulatory Visit: Payer: Self-pay | Admitting: Family Medicine

## 2021-02-21 ENCOUNTER — Other Ambulatory Visit: Payer: Self-pay

## 2021-02-21 NOTE — Telephone Encounter (Signed)
Requested medication (s) are due for refill today: Yes  Requested medication (s) are on the active medication list: Yes  Last refill:  12/18/19  Future visit scheduled: Yes  Notes to clinic:  Prescription expired.    Requested Prescriptions  Pending Prescriptions Disp Refills   TRUEplus Lancets 28G MISC 100 each 11    Sig: CHECK BLOOD SUGAR 3 TIMES DAILY.      Endocrinology: Diabetes - Testing Supplies Passed - 02/21/2021 10:39 AM      Passed - Valid encounter within last 12 months    Recent Outpatient Visits           1 month ago Myalgia   [facility] [place], [place], MD   7 months ago Type 2 diabetes mellitus with other neurologic complication, without long-term current use of insulin (HCC)   [facility] [place], [place], MD   1 year ago Type 2 diabetes mellitus with diabetic polyneuropathy, without long-term current use of insulin (HCC)   [facility] [place], [place], MD   1 year ago Type 2 diabetes mellitus with other neurologic complication, without long-term current use of insulin (HCC)   [facility] Hoy Register, MD   1 year ago Essential hypertension   [facility] [place], Shea Stakes, NP       Future Appointments             In 4 weeks Hoy Register, MD [facility]               glucose blood (TRUE METRIX BLOOD GLUCOSE TEST) test strip 100 strip 6    Sig: CHECK BLOOD SUGAR 3 TIMES DAILY.      Endocrinology: Diabetes - Testing Supplies Passed - 02/21/2021 10:39 AM      Passed - Valid encounter within last 12 months    Recent Outpatient Visits           1 month ago Myalgia   [facility] [place], [place], MD   7 months ago Type 2 diabetes mellitus with other neurologic complication, without long-term current use of insulin (HCC)   [facility] [place], [place], MD   1 year ago Type 2 diabetes mellitus with diabetic polyneuropathy, without long-term current use of insulin (HCC)   [facility] [place], [place], MD   1 year ago Type 2 diabetes mellitus with other neurologic complication, without long-term current use of insulin (HCC)   [facility] Hoy Register, MD   1 year ago Essential hypertension   [facility] [place], Shea Stakes, NP       Future Appointments             In 4 weeks Hoy Register, MD [facility]

## 2021-02-22 ENCOUNTER — Other Ambulatory Visit: Payer: Self-pay

## 2021-02-22 MED ORDER — TRUE METRIX BLOOD GLUCOSE TEST VI STRP
ORAL_STRIP | 2 refills | Status: AC
  Filled 2021-02-22: qty 100, 33d supply, fill #0
  Filled 2021-03-02 – 2021-11-30 (×2): qty 100, 30d supply, fill #0

## 2021-02-22 MED ORDER — TRUEPLUS LANCETS 28G MISC
2 refills | Status: AC
  Filled 2021-02-22: qty 100, 33d supply, fill #0
  Filled 2021-03-02: qty 100, 30d supply, fill #0

## 2021-02-23 ENCOUNTER — Other Ambulatory Visit: Payer: Self-pay

## 2021-03-02 ENCOUNTER — Other Ambulatory Visit: Payer: Self-pay

## 2021-03-03 ENCOUNTER — Other Ambulatory Visit: Payer: Self-pay

## 2021-03-21 ENCOUNTER — Ambulatory Visit: Payer: Medicare Other | Attending: Family Medicine | Admitting: Family Medicine

## 2021-03-21 ENCOUNTER — Other Ambulatory Visit: Payer: Self-pay

## 2021-03-21 ENCOUNTER — Encounter: Payer: Self-pay | Admitting: Family Medicine

## 2021-03-21 VITALS — BP 143/84 | HR 76 | Ht 76.0 in | Wt 312.4 lb

## 2021-03-21 DIAGNOSIS — I10 Essential (primary) hypertension: Secondary | ICD-10-CM | POA: Insufficient documentation

## 2021-03-21 DIAGNOSIS — Z885 Allergy status to narcotic agent status: Secondary | ICD-10-CM | POA: Insufficient documentation

## 2021-03-21 DIAGNOSIS — E785 Hyperlipidemia, unspecified: Secondary | ICD-10-CM | POA: Diagnosis not present

## 2021-03-21 DIAGNOSIS — Z8249 Family history of ischemic heart disease and other diseases of the circulatory system: Secondary | ICD-10-CM | POA: Diagnosis not present

## 2021-03-21 DIAGNOSIS — I152 Hypertension secondary to endocrine disorders: Secondary | ICD-10-CM

## 2021-03-21 DIAGNOSIS — E1149 Type 2 diabetes mellitus with other diabetic neurological complication: Secondary | ICD-10-CM

## 2021-03-21 DIAGNOSIS — Z6838 Body mass index (BMI) 38.0-38.9, adult: Secondary | ICD-10-CM | POA: Insufficient documentation

## 2021-03-21 DIAGNOSIS — E1159 Type 2 diabetes mellitus with other circulatory complications: Secondary | ICD-10-CM

## 2021-03-21 DIAGNOSIS — E1169 Type 2 diabetes mellitus with other specified complication: Secondary | ICD-10-CM

## 2021-03-21 DIAGNOSIS — Z7984 Long term (current) use of oral hypoglycemic drugs: Secondary | ICD-10-CM | POA: Insufficient documentation

## 2021-03-21 DIAGNOSIS — Z833 Family history of diabetes mellitus: Secondary | ICD-10-CM | POA: Diagnosis not present

## 2021-03-21 DIAGNOSIS — Z79899 Other long term (current) drug therapy: Secondary | ICD-10-CM | POA: Diagnosis not present

## 2021-03-21 DIAGNOSIS — Z713 Dietary counseling and surveillance: Secondary | ICD-10-CM | POA: Diagnosis not present

## 2021-03-21 LAB — POCT GLYCOSYLATED HEMOGLOBIN (HGB A1C): HbA1c, POC (controlled diabetic range): 7.7 % — AB (ref 0.0–7.0)

## 2021-03-21 LAB — GLUCOSE, POCT (MANUAL RESULT ENTRY): POC Glucose: 196 mg/dl — AB (ref 70–99)

## 2021-03-21 MED ORDER — AMLODIPINE BESYLATE 10 MG PO TABS: 10.0000 mg | ORAL_TABLET | Freq: Every day | ORAL | 1 refills | Status: DC

## 2021-03-21 MED ORDER — GLIPIZIDE 10 MG PO TABS: ORAL_TABLET | ORAL | 1 refills | Status: DC

## 2021-03-21 MED ORDER — LISINOPRIL-HYDROCHLOROTHIAZIDE 20-12.5 MG PO TABS: 2.0000 | ORAL_TABLET | Freq: Every day | ORAL | 1 refills | Status: DC

## 2021-03-21 MED ORDER — ATORVASTATIN CALCIUM 40 MG PO TABS: 40.0000 mg | ORAL_TABLET | Freq: Every day | ORAL | 1 refills | Status: DC

## 2021-03-21 MED ORDER — SPIRONOLACTONE 25 MG PO TABS: 25.0000 mg | ORAL_TABLET | Freq: Every day | ORAL | 1 refills | Status: DC

## 2021-03-21 MED ORDER — CARVEDILOL 25 MG PO TABS: 25.0000 mg | ORAL_TABLET | Freq: Two times a day (BID) | ORAL | 1 refills | Status: DC

## 2021-03-21 MED ORDER — CLONIDINE HCL 0.3 MG PO TABS: 0.3000 mg | ORAL_TABLET | Freq: Two times a day (BID) | ORAL | 1 refills | Status: DC

## 2021-03-21 NOTE — Patient Instructions (Signed)

## 2021-03-21 NOTE — Progress Notes (Signed)
 Subjective:  Patient ID: Anthony Barnes, male    DOB: 05/26/1956  Age: 65 y.o. MRN: [id number]  CC: Diabetes   HPI Anthony Barnes is a 65 year-old male with a history of hypertension, type 2 diabetes mellitus A1c ( A1c  7.7 ), morbid obesity here for follow-up visit.  Interval History: Blood pressure is slightly elevated but has improved compared to previous blood pressures.  He endorses compliance with his antihypertensives.  Fasting sugars are 111 - 131.  Denies presence of neuropathy and does not take gabapentin anymore.  He has no visual concerns of hypoglycemia. Had a reaction to Metformin in the past but is compliant with glipizide.  Jardiance was prescribed which he was unable to afford due to high insurance co-pay. He has no additional concerns today. Past Medical History:  Diagnosis Date   Diabetes mellitus without complication (HCC)    Hypertension     Past Surgical History:  Procedure Laterality Date   BRAIN SURGERY     ESOPHAGOGASTRODUODENOSCOPY N/A 03/22/2013   Procedure: ESOPHAGOGASTRODUODENOSCOPY (EGD);  Surgeon: Salem F Ganem, MD;  Location: [facility];  Service: Endoscopy;  Laterality: N/A;    Family History  Problem Relation Age of Onset   Hypertension Mother    Cancer Mother 58       ovarian   Diabetes Father    Stroke Father     Allergies  Allergen Reactions   Shellfish Allergy Anaphylaxis   Codeine Itching    unknown    Outpatient Medications Prior to Visit  Medication Sig Dispense Refill   Blood Glucose Monitoring Suppl (TRUE METRIX METER) w/Device KIT Check blood sugar 3 times daily. 1 kit 0   glucose blood (TRUE METRIX BLOOD GLUCOSE TEST) test strip CHECK BLOOD SUGAR 3 TIMES DAILY. 100 strip 2   ibuprofen (ADVIL,MOTRIN) 800 MG tablet Take 1 tablet (800 mg total) by mouth every 12 (twelve) hours as needed. 30 tablet 0   Multiple Vitamin (MULTIVITAMIN WITH MINERALS) TABS tablet Take 1 tablet by mouth daily.     omega-3 acid ethyl esters  (LOVAZA) 1 g capsule Take 1 g by mouth 2 (two) times daily.     TRUEplus Lancets 28G MISC CHECK BLOOD SUGAR 3 TIMES DAILY. 100 each 2   amLODipine (NORVASC) 10 MG tablet Take 1 tablet (10 mg total) by mouth daily. 90 tablet 0   carvedilol (COREG) 25 MG tablet Take 1 tablet (25 mg total) by mouth 2 (two) times daily with a meal. 180 tablet 0   cloNIDine (CATAPRES) 0.3 MG tablet Take 1 tablet (0.3 mg total) by mouth 2 (two) times daily. 180 tablet 0   glipiZIDE (GLUCOTROL) 10 MG tablet TAKE ONE TABLET BY MOUTH TWICE DAILY BEFORE MEALS 180 tablet 0   lisinopril-hydrochlorothiazide (ZESTORETIC) 20-12.5 MG tablet Take 2 tablets by mouth daily. 180 tablet 0   spironolactone (ALDACTONE) 25 MG tablet Take 1 tablet (25 mg total) by mouth daily. 90 tablet 1   atorvastatin (LIPITOR) 40 MG tablet Take 1 tablet (40 mg total) by mouth daily. (Patient not taking: Reported on 03/21/2021) 90 tablet 1   empagliflozin (JARDIANCE) 10 MG TABS tablet Take 1 tablet (10 mg total) by mouth daily before breakfast. (Patient not taking: Reported on 03/21/2021) 90 tablet 1   gabapentin (NEURONTIN) 300 MG capsule Take 1 capsule (300 mg total) by mouth at bedtime. (Patient not taking: Reported on 03/21/2021) 90 capsule 1   tiZANidine (ZANAFLEX) 4 MG tablet Take 1 tablet (4 mg total) by   mouth every 8 (eight) hours as needed for muscle spasms. (Patient not taking: Reported on 03/21/2021) 60 tablet 0   No facility-administered medications prior to visit.     ROS Review of Systems  Constitutional:  Negative for activity change and appetite change.  HENT:  Negative for sinus pressure and sore throat.   Eyes:  Negative for visual disturbance.  Respiratory:  Negative for cough, chest tightness and shortness of breath.   Cardiovascular:  Negative for chest pain and leg swelling.  Gastrointestinal:  Negative for abdominal distention, abdominal pain, constipation and diarrhea.  Endocrine: Negative.   Genitourinary:  Negative for dysuria.   Musculoskeletal:  Negative for joint swelling and myalgias.  Skin:  Negative for rash.  Allergic/Immunologic: Negative.   Neurological:  Negative for weakness, light-headedness and numbness.  Psychiatric/Behavioral:  Negative for dysphoric mood and suicidal ideas.    Objective:  BP (!) 143/84   Pulse 76   Ht 6' 4" (1.93 m)   Wt (!) 312 lb 6.4 oz (141.7 kg)   SpO2 98%   BMI 38.03 kg/m   BP/Weight 03/21/2021 12/20/2020 97/67/3419  Systolic BP 379 024 097  Diastolic BP 84 94 97  Wt. (Lbs) 312.4 - 320  BMI 38.03 - 38.95      Physical Exam Constitutional:      Appearance: He is well-developed. He is obese.  Cardiovascular:     Rate and Rhythm: Normal rate.     Heart sounds: Normal heart sounds. No murmur heard. Pulmonary:     Effort: Pulmonary effort is normal.     Breath sounds: Normal breath sounds. No wheezing or rales.  Chest:     Chest wall: No tenderness.  Abdominal:     General: Bowel sounds are normal. There is no distension.     Palpations: Abdomen is soft. There is no mass.     Tenderness: There is no abdominal tenderness.  Musculoskeletal:        General: Normal range of motion.     Right lower leg: No edema.     Left lower leg: No edema.  Neurological:     Mental Status: He is alert and oriented to person, place, and time.  Psychiatric:        Mood and Affect: Mood normal.   Diabetic Foot Exam - Simple   Simple Foot Form Diabetic Foot exam was performed with the following findings: Yes 03/21/2021  2:12 PM  Visual Inspection No deformities, no ulcerations, no other skin breakdown bilaterally: Yes Sensation Testing Intact to touch and monofilament testing bilaterally: Yes Pulse Check Posterior Tibialis and Dorsalis pulse intact bilaterally: Yes Comments     CMP Latest Ref Rng & Units 07/20/2020 12/16/2019 06/24/2019  Glucose 65 - 99 mg/dL 159(H) 225(H) 217(H)  BUN 8 - 27 mg/dL _0 Creatinine 0.76 - 1.27 mg/dL 0.98 1.07 1.11  Sodium 134 - 144 mmol/L  138 136 138  Potassium 3.5 - 5.2 mmol/L 4.5 4.5 4.4  Chloride 96 - 106 mmol/L 101 97 98  CO2 20 - 29 mmol/L _1 Calcium 8.6 - 10.2 mg/dL 9.7 9.7 9.8  Total Protein 6.0 - 8.5 g/dL 7.4 - 7.5  Total Bilirubin 0.0 - 1.2 mg/dL 0.4 - 0.4  Alkaline Phos 44 - 121 IU/L 46 - 58  AST 0 - 40 IU/L 16 - 22  ALT 0 - 44 IU/L 20 - 29    Lipid Panel     Component Value Date/Time   CHOL  156 07/20/2020 0903   TRIG 48 07/20/2020 0903   HDL 49 07/20/2020 0903   CHOLHDL 3.2 07/20/2020 0903   CHOLHDL 3.2 07/21/2016 1051   VLDL 10 07/21/2016 1051   LDLCALC 97 07/20/2020 0903    CBC    Component Value Date/Time   WBC 8.1 11/25/2017 0407   RBC 4.82 11/25/2017 0407   HGB 13.5 11/25/2017 0407   HCT 39.1 11/25/2017 0407   PLT 211 11/25/2017 0407   MCV 81.1 11/25/2017 0407   MCH 28.0 11/25/2017 0407   MCHC 34.5 11/25/2017 0407   RDW 15.4 11/25/2017 0407   LYMPHSABS 1.8 10/20/2013 1250   MONOABS 0.7 10/20/2013 1250   EOSABS 0.1 10/20/2013 1250   BASOSABS 0.0 10/20/2013 1250    Lab Results  Component Value Date   HGBA1C 7.7 (A) 03/21/2021    Assessment & Plan:  1. Type 2 diabetes mellitus with other neurologic complication, without long-term current use of insulin (HCC) Suboptimally controlled with A1c of 7.7 Goal is less than 7.0 Would love to place an SGLT2 inhibitor or GLP-1 agonist however high co-pay is a major limiting factor for him He is not open to any injectable. He will work on weight loss and other lifestyle modifications Counseled on Diabetic diet, my plate method, 546 minutes of moderate intensity exercise/week Blood sugar logs with fasting goals of 80-120 mg/dl, random of less than 180 and in the event of sugars less than 60 mg/dl or greater than 400 mg/dl encouraged to notify the clinic. Advised on the need for annual eye exams, annual foot exams, Pneumonia vaccine. - POCT glucose (manual entry) - POCT glycosylated hemoglobin (Hb A1C) - glipiZIDE (GLUCOTROL) 10 MG  tablet; TAKE ONE TABLET BY MOUTH TWICE DAILY BEFORE MEALS  Dispense: 180 tablet; Refill: 1 - CMP14+EGFR; Future - Lipid panel; Future - Microalbumin, urine; Future  2. Hypertension associated with diabetes ([place]) Slightly above goal but improved Continue to work on lifestyle modifications Counseled on blood pressure goal of less than 130/80, low-sodium, DASH diet, medication compliance, 150 minutes of moderate intensity exercise per week. Discussed medication compliance, adverse effects. - amLODipine (NORVASC) 10 MG tablet; Take 1 tablet (10 mg total) by mouth daily.  Dispense: 90 tablet; Refill: 1 - carvedilol (COREG) 25 MG tablet; Take 1 tablet (25 mg total) by mouth 2 (two) times daily with a meal.  Dispense: 180 tablet; Refill: 1 - cloNIDine (CATAPRES) 0.3 MG tablet; Take 1 tablet (0.3 mg total) by mouth 2 (two) times daily.  Dispense: 180 tablet; Refill: 1 - lisinopril-hydrochlorothiazide (ZESTORETIC) 20-12.5 MG tablet; Take 2 tablets by mouth daily.  Dispense: 180 tablet; Refill: 1 - spironolactone (ALDACTONE) 25 MG tablet; Take 1 tablet (25 mg total) by mouth daily.  Dispense: 90 tablet; Refill: 1  3. Hyperlipidemia associated with type 2 diabetes mellitus (HCC) Controlled Low-cholesterol diet - atorvastatin (LIPITOR) 40 MG tablet; Take 1 tablet (40 mg total) by mouth daily.  Dispense: 90 tablet; Refill: 1  4. Morbid obesity ([place]) Counseled on reducing portion sizes, increasing physical activity - Amb Ref to Medical Weight Management    Meds ordered this encounter  Medications   amLODipine (NORVASC) 10 MG tablet    Sig: Take 1 tablet (10 mg total) by mouth daily.    Dispense:  90 tablet    Refill:  1   atorvastatin (LIPITOR) 40 MG tablet    Sig: Take 1 tablet (40 mg total) by mouth daily.    Dispense:  90 tablet    Refill:  1   carvedilol (COREG) 25 MG tablet    Sig: Take 1 tablet (25 mg total) by mouth 2 (two) times daily with a meal.    Dispense:  180 tablet    Refill:   1    Dispense 90-day supply; dose change   cloNIDine (CATAPRES) 0.3 MG tablet    Sig: Take 1 tablet (0.3 mg total) by mouth 2 (two) times daily.    Dispense:  180 tablet    Refill:  1   lisinopril-hydrochlorothiazide (ZESTORETIC) 20-12.5 MG tablet    Sig: Take 2 tablets by mouth daily.    Dispense:  180 tablet    Refill:  1   spironolactone (ALDACTONE) 25 MG tablet    Sig: Take 1 tablet (25 mg total) by mouth daily.    Dispense:  90 tablet    Refill:  1   glipiZIDE (GLUCOTROL) 10 MG tablet    Sig: TAKE ONE TABLET BY MOUTH TWICE DAILY BEFORE MEALS    Dispense:  180 tablet    Refill:  1     Follow-up: Return in about 6 months (around 09/21/2021) for Diabetes mellitus.       Enobong Newlin, MD, FAAFP. [facility] [place], [place] [phone]   03/21/2021, 2:13 PM  

## 2021-03-28 DIAGNOSIS — Z23 Encounter for immunization: Secondary | ICD-10-CM | POA: Diagnosis not present

## 2021-04-29 ENCOUNTER — Ambulatory Visit (HOSPITAL_COMMUNITY)
Admission: EM | Admit: 2021-04-29 | Discharge: 2021-04-29 | Disposition: A | Discharge status: Home / Self Care | Payer: Medicare Other | Attending: Emergency Medicine | Admitting: Emergency Medicine

## 2021-04-29 ENCOUNTER — Encounter (HOSPITAL_COMMUNITY): Payer: Self-pay | Admitting: Emergency Medicine

## 2021-04-29 ENCOUNTER — Other Ambulatory Visit: Payer: Self-pay

## 2021-04-29 ENCOUNTER — Ambulatory Visit: Payer: Self-pay

## 2021-04-29 DIAGNOSIS — S8992XA Unspecified injury of left lower leg, initial encounter: Secondary | ICD-10-CM

## 2021-04-29 MED ORDER — BACITRACIN ZINC 500 UNIT/GM EX OINT: 1.0000 "application " | TOPICAL_OINTMENT | Freq: Two times a day (BID) | CUTANEOUS | 0 refills | Status: DC

## 2021-04-29 MED ORDER — BACITRACIN ZINC 500 UNIT/GM EX OINT
TOPICAL_OINTMENT | CUTANEOUS | Status: AC
  Filled 2021-04-29: qty 4.5
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
## 2021-04-29 NOTE — ED Provider Notes (Signed)
[facility]    CSN: [id number] Arrival date & time: 04/29/21  1504      History   Chief Complaint Chief Complaint  Patient presents with   Leg Pain    HPI ZIQUAN FIDEL is a 65 y.o. male.   Patient here for evaluation of left leg pain and swelling after knocking it against a car earlier today.  Reports noticed swelling immediately after hitting leg.  Has not taken any OTC medications or treatments.  Denies any specific alleviating or aggravating factors.  Denies any fevers, chest pain, shortness of breath, N/V/D, numbness, tingling, weakness, abdominal pain, or headaches.    The history is provided by the patient.  Leg Pain  Past Medical History:  Diagnosis Date   Diabetes mellitus without complication ([place])    Hypertension     Patient Active Problem List   Diagnosis Date Noted   Hyperlipidemia 06/25/2018   Morbid obesity ([place]) 03/13/2016   Dental caries 09/09/2015   Diabetes ([place]) 10/20/2013   DM (diabetes mellitus) type 2, uncontrolled, with ketoacidosis ([place]) 04/01/2013   Essential hypertension, benign 04/01/2013   GERD (gastroesophageal reflux disease) 04/01/2013   Erosive gastritis 03/22/2013   DM2 (diabetes mellitus, type 2) ([place]) 03/21/2013   Unspecified essential hypertension 03/21/2013   Hyponatremia 03/21/2013   Epigastric pain 03/20/2013   HTN (hypertension) 03/20/2013    Past Surgical History:  Procedure Laterality Date   BRAIN SURGERY     ESOPHAGOGASTRODUODENOSCOPY N/A 03/22/2013   Procedure: ESOPHAGOGASTRODUODENOSCOPY (EGD);  Surgeon: Wonda Horner, MD;  Location: [facility] ENDOSCOPY;  Service: Endoscopy;  Laterality: N/A;       Home Medications    Prior to Admission medications   Medication Sig Start Date End Date Taking? Authorizing Provider  bacitracin ointment Apply 1 application topically 2 (two) times daily. 04/29/21  Yes Pearson Forster, NP  amLODipine (NORVASC) 10 MG tablet Take 1 tablet (10 mg total) by mouth daily. 03/21/21    Charlott Rakes, MD  atorvastatin (LIPITOR) 40 MG tablet Take 1 tablet (40 mg total) by mouth daily. 03/21/21   Charlott Rakes, MD  Blood Glucose Monitoring Suppl (TRUE METRIX METER) w/Device KIT Check blood sugar 3 times daily. 12/18/19   Charlott Rakes, MD  carvedilol (COREG) 25 MG tablet Take 1 tablet (25 mg total) by mouth 2 (two) times daily with a meal. 03/21/21   Charlott Rakes, MD  cloNIDine (CATAPRES) 0.3 MG tablet Take 1 tablet (0.3 mg total) by mouth 2 (two) times daily. 03/21/21   Charlott Rakes, MD  glipiZIDE (GLUCOTROL) 10 MG tablet TAKE ONE TABLET BY MOUTH TWICE DAILY BEFORE MEALS 03/21/21   Newlin, Charlane Ferretti, MD  glucose blood (TRUE METRIX BLOOD GLUCOSE TEST) test strip CHECK BLOOD SUGAR 3 TIMES DAILY. 02/22/21 02/21/22  Charlott Rakes, MD  ibuprofen (ADVIL,MOTRIN) 800 MG tablet Take 1 tablet (800 mg total) by mouth every 12 (twelve) hours as needed. 08/01/18   Charlott Rakes, MD  lisinopril-hydrochlorothiazide (ZESTORETIC) 20-12.5 MG tablet Take 2 tablets by mouth daily. 03/21/21   Charlott Rakes, MD  Multiple Vitamin (MULTIVITAMIN WITH MINERALS) TABS tablet Take 1 tablet by mouth daily.    [provider]  omega-3 acid ethyl esters (LOVAZA) 1 g capsule Take 1 g by mouth 2 (two) times daily.    [provider]  spironolactone (ALDACTONE) 25 MG tablet Take 1 tablet (25 mg total) by mouth daily. 03/21/21   Charlott Rakes, MD  TRUEplus Lancets 28G MISC CHECK BLOOD SUGAR 3 TIMES DAILY. 02/22/21  Charlott Rakes, MD    Family History Family History  Problem Relation Age of Onset   Hypertension Mother    Cancer Mother 24       ovarian   Diabetes Father    Stroke Father     Social History Social History   Tobacco Use   Smoking status: Former    Packs/day: 1.00    Years: 16.00    Pack years: 16.00    Types: Cigarettes    Start date: 08/14/1974    Quit date: 08/14/1990    Years since quitting: 30.7   Smokeless tobacco: Former    Quit date: 08/14/1990  Vaping Use    Vaping Use: Never used  Substance Use Topics   Alcohol use: Yes    Alcohol/week: 4.0 standard drinks    Types: 2 Cans of beer, 2 Shots of liquor per week    Comment: occas   Drug use: No     Allergies   Shellfish allergy and Codeine   Review of Systems Review of Systems  Musculoskeletal:  Positive for joint swelling.  Skin:  Positive for wound.  All other systems reviewed and are negative.   Physical Exam Triage Vital Signs ED Triage Vitals  Enc Vitals Group     BP 04/29/21 1624 (!) 182/111     Pulse Rate 04/29/21 1624 93     Resp 04/29/21 1624 17     Temp 04/29/21 1624 98.4 F (36.9 C)     Temp Source 04/29/21 1624 Oral     SpO2 04/29/21 1624 96 %     Weight --      Height --      Head Circumference --      Peak Flow --      Pain Score 04/29/21 1623 0     Pain Loc --      Pain Edu? --      Excl. in [place]? --    No data found.  Updated Vital Signs BP (!) 182/111 (BP Location: Right Arm)   Pulse 93   Temp 98.4 F (36.9 C) (Oral)   Resp 17   SpO2 96%   Visual Acuity Right Eye Distance:   Left Eye Distance:   Bilateral Distance:    Right Eye Near:   Left Eye Near:    Bilateral Near:     Physical Exam Vitals and nursing note reviewed.  Constitutional:      General: He is not in acute distress.    Appearance: Normal appearance. He is not ill-appearing, toxic-appearing or diaphoretic.  HENT:     Head: Normocephalic and atraumatic.  Eyes:     Conjunctiva/sclera: Conjunctivae normal.  Cardiovascular:     Rate and Rhythm: Normal rate.     Pulses: Normal pulses.     Heart sounds: Normal heart sounds.  Pulmonary:     Effort: Pulmonary effort is normal.     Breath sounds: Normal breath sounds.  Abdominal:     General: Abdomen is flat.  Musculoskeletal:        General: Normal range of motion.     Cervical back: Normal range of motion.     Left lower leg: Swelling (swelling surrounding skin tear/abrasion to left lower leg) present. No deformity,  lacerations, tenderness or bony tenderness.  Skin:    General: Skin is warm and dry.     Findings: Abrasion present.  Neurological:     General: No focal deficit present.     Mental Status: He  is alert and oriented to person, place, and time.  Psychiatric:        Mood and Affect: Mood normal.     UC Treatments / Results  Labs (all labs ordered are listed, but only abnormal results are displayed) Labs Reviewed - No data to display  EKG   Radiology No results found.  Procedures Procedures (including critical care time)  Medications Ordered in UC Medications - No data to display  Initial Impression / Assessment and Plan / UC Course  I have reviewed the triage vital signs and the nursing notes.  Pertinent labs & imaging results that were available during my care of the patient were reviewed by me and considered in my medical decision making (see chart for details).    Assessment negative for red flags or concerns.  Abrasion to left lower leg with contusion.  Wound cleaned and antibiotic ointment applied in office.  Instructed to wash wound 1 or twice a day with soap and water and may apply antibiotic ointment twice a day.  Instructed to return for any signs of infection.  Follow-up with primary care for reevaluation. Final Clinical Impressions(s) / UC Diagnoses   Final diagnoses:  Leg injury, left, initial encounter     Discharge Instructions      Wash the wound on your leg once or twice a day with soap and water.  You can apply the bacitracin ointment twice a day to help prevent infection.  You can take Tylenol and/or ibuprofen as needed for pain relief and fever reduction.  Return for reevaluation for any worsening symptoms including worsening redness, swelling, red streaks, discharge/drainage or fevers.      ED Prescriptions     Medication Sig Dispense Auth. Provider   bacitracin ointment Apply 1 application topically 2 (two) times daily. 120 g Pearson Forster, NP      PDMP not reviewed this encounter.   Pearson Forster, NP 04/29/21 1711

## 2021-04-29 NOTE — ED Triage Notes (Signed)
Pt presents with left leg pain swelling. States scraped leg on car tag earlier today.

## 2021-04-29 NOTE — Discharge Instructions (Addendum)
Wash the wound on your leg once or twice a day with soap and water.  You can apply the bacitracin ointment twice a day to help prevent infection.  You can take Tylenol and/or ibuprofen as needed for pain relief and fever reduction.  Return for reevaluation for any worsening symptoms including worsening redness, swelling, red streaks, discharge/drainage or fevers.

## 2021-07-18 ENCOUNTER — Telehealth: Payer: Self-pay | Admitting: Family Medicine

## 2021-07-18 ENCOUNTER — Ambulatory Visit: Payer: Self-pay

## 2021-07-18 NOTE — Telephone Encounter (Signed)
Copied from CRM [id number]. Topic: General - Other >> Jul 18, 2021  9:11 AM Wyonia Hough E wrote: Reason for CRM: Pt wanted to know if Dr. Alvis Lemmings had any dentist to recommend to pt./ he has a tooth ache and wants to go to see a dentist today but needs help finding one that takes medicare / please advise asap

## 2021-07-18 NOTE — Telephone Encounter (Signed)
Called pt back and stated he is having severe left side upper toothache with selling. Pt has had toothache for a while but got worse yesterday.  Called several office in [place] but none take Medicare. Wife stated she called an office who take Medicare but cannot see pt until tomorrow am. Advised pt to go to ED ASAP due to severe pain and swelling. Pt stated he may wait to go to appt tomorrow am.  Care advice given and pt verbalized understanding.         Reason for Disposition  [1] SEVERE pain (e.g., excruciating, unable to do any normal activities) AND [2] not improved 2 hours after pain medicine  Answer Assessment - Initial Assessment Questions 1. LOCATION: "Which tooth is hurting?"  (e.g., right-side/left-side, upper/lower, front/back)     Left side upper 2. ONSET: "When did the toothache start?"  (e.g., hours, days)      Worse yesterday 3. SEVERITY: "How bad is the toothache?"  (Scale 1-10; mild, moderate or severe)   - MILD (1-3): doesn't interfere with chewing    - MODERATE (4-7): interferes with chewing, interferes with normal activities, awakens from sleep     - SEVERE (8-10): unable to eat, unable to do any normal activities, excruciating pain        severe 4. SWELLING: "Is there any visible swelling of your face?"     yes 5. OTHER SYMPTOMS: "Do you have any other symptoms?" (e.g., fever)     no 6. PREGNANCY: "Is there any chance you are pregnant?" "When was your last menstrual period?"     N/a  Protocols used: Toothache-A-AH

## 2021-07-18 NOTE — Telephone Encounter (Signed)
Pt was called and informed to contact his insurance company and see who is in network.

## 2021-10-19 ENCOUNTER — Other Ambulatory Visit: Payer: Self-pay | Admitting: Family Medicine

## 2021-10-19 DIAGNOSIS — E785 Hyperlipidemia, unspecified: Secondary | ICD-10-CM

## 2021-10-19 DIAGNOSIS — E1149 Type 2 diabetes mellitus with other diabetic neurological complication: Secondary | ICD-10-CM

## 2021-10-19 DIAGNOSIS — E1159 Type 2 diabetes mellitus with other circulatory complications: Secondary | ICD-10-CM

## 2021-10-19 DIAGNOSIS — E1169 Type 2 diabetes mellitus with other specified complication: Secondary | ICD-10-CM

## 2021-10-19 DIAGNOSIS — I152 Hypertension secondary to endocrine disorders: Secondary | ICD-10-CM

## 2021-10-19 NOTE — Telephone Encounter (Signed)
Medication Refill - Medication: amLODipine (NORVASC) 10 MG tablet ?atorvastatin (LIPITOR) 40 MG tablet ?cloNIDine (CATAPRES) 0.3 MG tablet ?glipiZIDE (GLUCOTROL) 10 MG tablet ?lisinopril-hydrochlorothiazide (ZESTORETIC) 20-12.5 MG tablet ?spironolactone (ALDACTONE) 25 MG tablet ? ?Has the patient contacted their pharmacy? No. ?Pt just realized he will be out of his medications, and called. I made pt first available with Dr Margarita Rana. ?Preferred Pharmacy (with phone number or street name): [facility] # [place], [place] ? ?Has the patient been seen for an appointment in the last year OR does the patient have an upcoming appointment? Yes.   ? ?Agent: Please be advised that RX refills may take up to 3 business days. We ask that you follow-up with your pharmacy. ? ?

## 2021-10-20 MED ORDER — AMLODIPINE BESYLATE 10 MG PO TABS: 10.0000 mg | ORAL_TABLET | Freq: Every day | ORAL | 0 refills | Status: DC

## 2021-10-20 MED ORDER — GLIPIZIDE 10 MG PO TABS: ORAL_TABLET | ORAL | 0 refills | Status: DC

## 2021-10-20 MED ORDER — CLONIDINE HCL 0.3 MG PO TABS: 0.3000 mg | ORAL_TABLET | Freq: Two times a day (BID) | ORAL | 0 refills | Status: DC

## 2021-10-20 NOTE — Telephone Encounter (Signed)
Requested medication (s) are due for refill today: yes , all  Requested medication (s) are on the active medication list: yes    Last refill: 03/21/21  #90 day supplies  Future visit scheduled yes 01/04/22  Notes to clinic:Meds failed due to labs, please review. Thank you.  Requested Prescriptions  Pending Prescriptions Disp Refills   atorvastatin (LIPITOR) 40 MG tablet 90 tablet 1    Sig: Take 1 tablet (40 mg total) by mouth daily.     Cardiovascular:  Antilipid - Statins Failed - 10/19/2021 11:17 AM      Failed - Lipid Panel in normal range within the last 12 months    Cholesterol, Total  Date Value Ref Range Status  07/20/2020 156 100 - 199 mg/dL Final   LDL Chol Calc (NIH)  Date Value Ref Range Status  07/20/2020 97 0 - 99 mg/dL Final   HDL  Date Value Ref Range Status  07/20/2020 49 >39 mg/dL Final   Triglycerides  Date Value Ref Range Status  07/20/2020 48 0 - 149 mg/dL Final         Passed - Patient is not pregnant      Passed - Valid encounter within last 12 months    Recent Outpatient Visits           7 months ago Type 2 diabetes mellitus with other neurologic complication, without long-term current use of insulin (HCC)   [facility] [place], [place], MD   9 months ago Myalgia   [facility] [place], [place], MD   1 year ago Type 2 diabetes mellitus with other neurologic complication, without long-term current use of insulin (HCC)   [facility] [place], [place], MD   1 year ago Type 2 diabetes mellitus with diabetic polyneuropathy, without long-term current use of insulin (HCC)   [facility] [place], [place], MD   2 years ago Type 2 diabetes mellitus with other neurologic complication, without long-term current use of insulin (HCC)   [facility] Hoy Register, MD       Future Appointments             In 2  months Hoy Register, MD [facility]             lisinopril-hydrochlorothiazide (ZESTORETIC) 20-12.5 MG tablet 180 tablet 1    Sig: Take 2 tablets by mouth daily.     Cardiovascular:  ACEI + Diuretic Combos Failed - 10/19/2021 11:17 AM      Failed - Na in normal range and within 180 days    Sodium  Date Value Ref Range Status  07/20/2020 138 134 - 144 mmol/L Final          Failed - K in normal range and within 180 days    Potassium  Date Value Ref Range Status  07/20/2020 4.5 3.5 - 5.2 mmol/L Final          Failed - Cr in normal range and within 180 days    Creat  Date Value Ref Range Status  07/21/2016 1.11 0.70 - 1.33 mg/dL Final    Comment:      For patients > or = 64 years of age: The upper reference limit for Creatinine is approximately 13% higher for people identified as African-American.      Creatinine, Ser  Date Value Ref Range Status  07/20/2020 0.98 0.76 -  1.27 mg/dL Final   Creatinine, Urine  Date Value Ref Range Status  10/20/2013 113.6 mg/dL Final          Failed - eGFR is 30 or above and within 180 days    GFR, Est African American  Date Value Ref Range Status  11/06/2014 >89 mL/min Final   GFR calc Af Amer  Date Value Ref Range Status  07/20/2020 95 >59 mL/min/1.73 Final    Comment:    **In accordance with recommendations from the NKF-ASN Task force,**   [facility] is in the process of updating its eGFR calculation to the   2021 CKD-EPI creatinine equation that estimates kidney function   without a race variable.    GFR, Est Non African American  Date Value Ref Range Status  11/06/2014 82 mL/min Final    Comment:      The estimated GFR is a calculation valid for adults (>=71 years old) that uses the CKD-EPI algorithm to adjust for age and sex. It is   not to be used for children, pregnant women, hospitalized patients,    patients on dialysis, or with rapidly changing kidney function. According to the  NKDEP, eGFR >89 is normal, 60-89 shows mild impairment, 30-59 shows moderate impairment, 15-29 shows severe impairment and <15 is ESRD.      GFR calc non Af Amer  Date Value Ref Range Status  07/20/2020 82 >59 mL/min/1.73 Final          Failed - Last BP in normal range    BP Readings from Last 1 Encounters:  04/29/21 (!) 182/111          Failed - Valid encounter within last 6 months    Recent Outpatient Visits           7 months ago Type 2 diabetes mellitus with other neurologic complication, without long-term current use of insulin (HCC)   [facility] [place], Odette Horns, MD   9 months ago Myalgia   [facility] [place], Odette Horns, MD   1 year ago Type 2 diabetes mellitus with other neurologic complication, without long-term current use of insulin (HCC)   [facility] [place], [place], MD   1 year ago Type 2 diabetes mellitus with diabetic polyneuropathy, without long-term current use of insulin (HCC)   [facility] [place], [place], MD   2 years ago Type 2 diabetes mellitus with other neurologic complication, without long-term current use of insulin (HCC)   [facility] [place], Odette Horns, MD       Future Appointments             In 2 months Hoy Register, MD [facility]            Passed - Patient is not pregnant       spironolactone (ALDACTONE) 25 MG tablet 90 tablet 1    Sig: Take 1 tablet (25 mg total) by mouth daily.     Cardiovascular: Diuretics - Aldosterone Antagonist Failed - 10/19/2021 11:17 AM      Failed - Cr in normal range and within 180 days    Creat  Date Value Ref Range Status  07/21/2016 1.11 0.70 - 1.33 mg/dL Final    Comment:      For patients > or = 64 years of age: The upper reference limit for Creatinine is approximately 13% higher for people identified  as  African-American.      Creatinine, Ser  Date Value Ref Range Status  07/20/2020 0.98 0.76 - 1.27 mg/dL Final   Creatinine, Urine  Date Value Ref Range Status  10/20/2013 113.6 mg/dL Final          Failed - K in normal range and within 180 days    Potassium  Date Value Ref Range Status  07/20/2020 4.5 3.5 - 5.2 mmol/L Final          Failed - Na in normal range and within 180 days    Sodium  Date Value Ref Range Status  07/20/2020 138 134 - 144 mmol/L Final          Failed - eGFR is 30 or above and within 180 days    GFR, Est African American  Date Value Ref Range Status  11/06/2014 >89 mL/min Final   GFR calc Af Amer  Date Value Ref Range Status  07/20/2020 95 >59 mL/min/1.73 Final    Comment:    **In accordance with recommendations from the NKF-ASN Task force,**   [facility] is in the process of updating its eGFR calculation to the   2021 CKD-EPI creatinine equation that estimates kidney function   without a race variable.    GFR, Est Non African American  Date Value Ref Range Status  11/06/2014 82 mL/min Final    Comment:      The estimated GFR is a calculation valid for adults (>=38 years old) that uses the CKD-EPI algorithm to adjust for age and sex. It is   not to be used for children, pregnant women, hospitalized patients,    patients on dialysis, or with rapidly changing kidney function. According to the NKDEP, eGFR >89 is normal, 60-89 shows mild impairment, 30-59 shows moderate impairment, 15-29 shows severe impairment and <15 is ESRD.      GFR calc non Af Amer  Date Value Ref Range Status  07/20/2020 82 >59 mL/min/1.73 Final          Failed - Last BP in normal range    BP Readings from Last 1 Encounters:  04/29/21 (!) 182/111          Failed - Valid encounter within last 6 months    Recent Outpatient Visits           7 months ago Type 2 diabetes mellitus with other neurologic complication, without long-term current use of insulin  (HCC)   [facility] [place], Odette Horns, MD   9 months ago Myalgia   [facility] [place], Odette Horns, MD   1 year ago Type 2 diabetes mellitus with other neurologic complication, without long-term current use of insulin (HCC)   [facility] [place], [place], MD   1 year ago Type 2 diabetes mellitus with diabetic polyneuropathy, without long-term current use of insulin (HCC)   [facility] [place], Odette Horns, MD   2 years ago Type 2 diabetes mellitus with other neurologic complication, without long-term current use of insulin (HCC)   [facility] Hoy Register, MD       Future Appointments             In 2 months Hoy Register, MD [facility]            Signed Prescriptions Disp Refills   amLODipine (NORVASC) 10 MG tablet 90 tablet 0    Sig: Take 1  tablet (10 mg total) by mouth daily.     Cardiovascular: Calcium Channel Blockers 2 Failed - 10/19/2021 11:17 AM      Failed - Last BP in normal range    BP Readings from Last 1 Encounters:  04/29/21 (!) 182/111          Failed - Valid encounter within last 6 months    Recent Outpatient Visits           7 months ago Type 2 diabetes mellitus with other neurologic complication, without long-term current use of insulin (HCC)   [facility] [place], Odette Horns, MD   9 months ago Myalgia   [facility] [place], Odette Horns, MD   1 year ago Type 2 diabetes mellitus with other neurologic complication, without long-term current use of insulin (HCC)   [facility] [place], [place], MD   1 year ago Type 2 diabetes mellitus with diabetic polyneuropathy, without long-term current use of insulin (HCC)   [facility] [place], [place], MD   2 years ago Type 2 diabetes  mellitus with other neurologic complication, without long-term current use of insulin (HCC)   [facility] [place], Odette Horns, MD       Future Appointments             In 2 months Hoy Register, MD [facility]            Passed - Last Heart Rate in normal range    Pulse Readings from Last 1 Encounters:  04/29/21 93           cloNIDine (CATAPRES) 0.3 MG tablet 180 tablet 0    Sig: Take 1 tablet (0.3 mg total) by mouth 2 (two) times daily.     Cardiovascular:  Alpha-2 Agonists Failed - 10/19/2021 11:17 AM      Failed - Last BP in normal range    BP Readings from Last 1 Encounters:  04/29/21 (!) 182/111          Failed - Valid encounter within last 6 months    Recent Outpatient Visits           7 months ago Type 2 diabetes mellitus with other neurologic complication, without long-term current use of insulin (HCC)   [facility] [place], Odette Horns, MD   9 months ago Myalgia   [facility] [place], Odette Horns, MD   1 year ago Type 2 diabetes mellitus with other neurologic complication, without long-term current use of insulin (HCC)   [facility] [place], [place], MD   1 year ago Type 2 diabetes mellitus with diabetic polyneuropathy, without long-term current use of insulin (HCC)   [facility] [place], Odette Horns, MD   2 years ago Type 2 diabetes mellitus with other neurologic complication, without long-term current use of insulin (HCC)   [facility] Hoy Register, MD       Future Appointments             In 2 months Hoy Register, MD [facility]            Passed - Last Heart Rate in normal range    Pulse Readings from Last 1 Encounters:  04/29/21 93           glipiZIDE (GLUCOTROL) 10  MG tablet 180 tablet 0    Sig: TAKE ONE TABLET BY  MOUTH TWICE DAILY BEFORE MEALS     Endocrinology:  Diabetes - Sulfonylureas Failed - 10/19/2021 11:17 AM      Failed - HBA1C is between 0 and 7.9 and within 180 days    HbA1c, POC (controlled diabetic range)  Date Value Ref Range Status  03/21/2021 7.7 (A) 0.0 - 7.0 % Final          Failed - Cr in normal range and within 360 days    Creat  Date Value Ref Range Status  07/21/2016 1.11 0.70 - 1.33 mg/dL Final    Comment:      For patients > or = 64 years of age: The upper reference limit for Creatinine is approximately 13% higher for people identified as African-American.      Creatinine, Ser  Date Value Ref Range Status  07/20/2020 0.98 0.76 - 1.27 mg/dL Final   Creatinine, Urine  Date Value Ref Range Status  10/20/2013 113.6 mg/dL Final          Failed - Valid encounter within last 6 months    Recent Outpatient Visits           7 months ago Type 2 diabetes mellitus with other neurologic complication, without long-term current use of insulin (HCC)   [facility] [employer], Odette Horns, MD   9 months ago Myalgia   [facility] [place], Odette Horns, MD   1 year ago Type 2 diabetes mellitus with other neurologic complication, without long-term current use of insulin (HCC)   [facility] [place], Odette Horns, MD   1 year ago Type 2 diabetes mellitus with diabetic polyneuropathy, without long-term current use of insulin (HCC)   [facility] [place], Odette Horns, MD   2 years ago Type 2 diabetes mellitus with other neurologic complication, without long-term current use of insulin (HCC)    [facility] Hoy Register, MD       Future Appointments             In 2 months Hoy Register, MD [facility]

## 2021-10-20 NOTE — Telephone Encounter (Signed)
Courtesy refill #90 days for [facility]  Glipizide  Requested Prescriptions  Pending Prescriptions Disp Refills   amLODipine (NORVASC) 10 MG tablet 90 tablet 0    Sig: Take 1 tablet (10 mg total) by mouth daily.     Cardiovascular: Calcium Channel Blockers 2 Failed - 10/19/2021 11:17 AM      Failed - Last BP in normal range    BP Readings from Last 1 Encounters:  04/29/21 (!) 182/111         Failed - Valid encounter within last 6 months    Recent Outpatient Visits          7 months ago Type 2 diabetes mellitus with other neurologic complication, without long-term current use of insulin ([place])   [place], Charlane Ferretti, MD   9 months ago [place], Charlane Ferretti, MD   1 year ago Type 2 diabetes mellitus with other neurologic complication, without long-term current use of insulin ([place])   [place], [place], MD   1 year ago Type 2 diabetes mellitus with diabetic polyneuropathy, without long-term current use of insulin ([place])   [place], [place], MD   2 years ago Type 2 diabetes mellitus with other neurologic complication, without long-term current use of insulin ([place])   [place], Charlane Ferretti, MD      Future Appointments            In 2 months Charlott Rakes, MD [place] - Last Heart Rate in normal range    Pulse Readings from Last 1 Encounters:  04/29/21 93          atorvastatin (LIPITOR) 40 MG tablet 90 tablet 1    Sig: Take 1 tablet (40 mg total) by mouth daily.     Cardiovascular:  Antilipid - Statins Failed - 10/19/2021 11:17 AM      Failed - Lipid Panel in normal range within the last 12 months    Cholesterol, Total  Date Value Ref Range Status  07/20/2020 156 100 - 199 mg/dL Final   LDL Chol Calc (NIH)  Date Value Ref  Range Status  07/20/2020 97 0 - 99 mg/dL Final   HDL  Date Value Ref Range Status  07/20/2020 49 >39 mg/dL Final   Triglycerides  Date Value Ref Range Status  07/20/2020 48 0 - 149 mg/dL Final         Passed - Patient is not pregnant      Passed - Valid encounter within last 12 months    Recent Outpatient Visits          7 months ago Type 2 diabetes mellitus with other neurologic complication, without long-term current use of insulin ([place])   [place], [place], MD   9 months ago [place], [employer], MD   1 year ago Type 2 diabetes mellitus with other neurologic complication, without long-term current use of insulin ([place])   [place], [place], MD   1 year ago Type 2 diabetes mellitus with diabetic polyneuropathy, without long-term current use of insulin ([place])   [place], Charlane Ferretti, MD   2 years ago Type 2 diabetes mellitus with  other neurologic complication, without long-term current use of insulin ([place])   [place], Charlane Ferretti, MD      Future Appointments            In 2 months Charlott Rakes, MD [place]            cloNIDine (CATAPRES) 0.3 MG tablet 180 tablet 0    Sig: Take 1 tablet (0.3 mg total) by mouth 2 (two) times daily.     Cardiovascular:  Alpha-2 Agonists Failed - 10/19/2021 11:17 AM      Failed - Last BP in normal range    BP Readings from Last 1 Encounters:  04/29/21 (!) 182/111         Failed - Valid encounter within last 6 months    Recent Outpatient Visits          7 months ago Type 2 diabetes mellitus with other neurologic complication, without long-term current use of insulin ([place])   [place], [place], MD   9 months ago [place], Charlane Ferretti, MD   1 year ago Type 2 diabetes mellitus with other neurologic complication, without long-term current use of insulin ([place])   [place], Charlane Ferretti, MD   1 year ago Type 2 diabetes mellitus with diabetic polyneuropathy, without long-term current use of insulin ([place])   [place], Charlane Ferretti, MD   2 years ago Type 2 diabetes mellitus with other neurologic complication, without long-term current use of insulin ([place])   [place], Charlane Ferretti, MD      Future Appointments            In 2 months Charlott Rakes, MD [place] - Last Heart Rate in normal range    Pulse Readings from Last 1 Encounters:  04/29/21 93          glipiZIDE (GLUCOTROL) 10 MG tablet 180 tablet 0    Sig: TAKE ONE TABLET BY MOUTH TWICE DAILY BEFORE MEALS     Endocrinology:  Diabetes - Sulfonylureas Failed - 10/19/2021 11:17 AM      Failed - HBA1C is between 0 and 7.9 and within 180 days    HbA1c, POC (controlled diabetic range)  Date Value Ref Range Status  03/21/2021 7.7 (A) 0.0 - 7.0 % Final         Failed - Cr in normal range and within 360 days    Creat  Date Value Ref Range Status  07/21/2016 1.11 0.70 - 1.33 mg/dL Final    Comment:      For patients > or = 64 years of age: The upper reference limit for Creatinine is approximately 13% higher for people identified as African-American.      Creatinine, Ser  Date Value Ref Range Status  07/20/2020 0.98 0.76 - 1.27 mg/dL Final   Creatinine, Urine  Date Value Ref Range Status  10/20/2013 113.6 mg/dL Final         Failed - Valid encounter within last 6 months    Recent Outpatient Visits          7 months ago Type 2 diabetes mellitus with other neurologic complication, without long-term current use of insulin ([place])   [place]  Charlott Rakes, MD    9 months ago [place], [place], MD   1 year ago Type 2 diabetes mellitus with other neurologic complication, without long-term current use of insulin ([place])   [place], [place], MD   1 year ago Type 2 diabetes mellitus with diabetic polyneuropathy, without long-term current use of insulin ([place])   [place], Charlane Ferretti, MD   2 years ago Type 2 diabetes mellitus with other neurologic complication, without long-term current use of insulin ([place])   [place], [place]obong, MD      Future Appointments            In 2 months Charlott Rakes, MD [place]            lisinopril-hydrochlorothiazide (ZESTORETIC) 20-12.5 MG tablet 180 tablet 1    Sig: Take 2 tablets by mouth daily.     Cardiovascular:  ACEI + Diuretic Combos Failed - 10/19/2021 11:17 AM      Failed - Na in normal range and within 180 days    Sodium  Date Value Ref Range Status  07/20/2020 138 134 - 144 mmol/L Final         Failed - K in normal range and within 180 days    Potassium  Date Value Ref Range Status  07/20/2020 4.5 3.5 - 5.2 mmol/L Final         Failed - Cr in normal range and within 180 days    Creat  Date Value Ref Range Status  07/21/2016 1.11 0.70 - 1.33 mg/dL Final    Comment:      For patients > or = 64 years of age: The upper reference limit for Creatinine is approximately 13% higher for people identified as African-American.      Creatinine, Ser  Date Value Ref Range Status  07/20/2020 0.98 0.76 - 1.27 mg/dL Final   Creatinine, Urine  Date Value Ref Range Status  10/20/2013 113.6 mg/dL Final         Failed - eGFR is 30 or above and within 180 days    GFR, Est African American  Date Value Ref Range Status  11/06/2014 >89 mL/min Final   GFR calc Af Amer  Date Value Ref Range Status  07/20/2020  95 >59 mL/min/1.73 Final    Comment:    **In accordance with recommendations from the NKF-ASN Task force,**   [facility] is in the process of updating its eGFR calculation to the   2021 CKD-EPI creatinine equation that estimates kidney function   without a race variable.    GFR, Est Non African American  Date Value Ref Range Status  11/06/2014 82 mL/min Final    Comment:      The estimated GFR is a calculation valid for adults (>=42 years old) that uses the CKD-EPI algorithm to adjust for age and sex. It is   not to be used for children, pregnant women, hospitalized patients,    patients on dialysis, or with rapidly changing kidney function. According to the NKDEP, eGFR >89 is normal, 60-89 shows mild impairment, 30-59 shows moderate impairment, 15-29 shows severe impairment and <15 is ESRD.      GFR calc non Af Amer  Date Value Ref Range Status  07/20/2020 82 >59 mL/min/1.73 Final         Failed - Last BP in normal  range    BP Readings from Last 1 Encounters:  04/29/21 (!) 182/111         Failed - Valid encounter within last 6 months    Recent Outpatient Visits          7 months ago Type 2 diabetes mellitus with other neurologic complication, without long-term current use of insulin ([place])   [place], Charlane Ferretti, MD   9 months ago [place], Charlane Ferretti, MD   1 year ago Type 2 diabetes mellitus with other neurologic complication, without long-term current use of insulin ([place])   [place], Charlane Ferretti, MD   1 year ago Type 2 diabetes mellitus with diabetic polyneuropathy, without long-term current use of insulin ([place])   [place], Charlane Ferretti, MD   2 years ago Type 2 diabetes mellitus with other neurologic complication, without long-term current use of insulin ([place])   [place], Charlane Ferretti,  MD      Future Appointments            In 2 months Charlott Rakes, MD [place] - Patient is not pregnant       spironolactone (ALDACTONE) 25 MG tablet 90 tablet 1    Sig: Take 1 tablet (25 mg total) by mouth daily.     Cardiovascular: Diuretics - Aldosterone Antagonist Failed - 10/19/2021 11:17 AM      Failed - Cr in normal range and within 180 days    Creat  Date Value Ref Range Status  07/21/2016 1.11 0.70 - 1.33 mg/dL Final    Comment:      For patients > or = 64 years of age: The upper reference limit for Creatinine is approximately 13% higher for people identified as African-American.      Creatinine, Ser  Date Value Ref Range Status  07/20/2020 0.98 0.76 - 1.27 mg/dL Final   Creatinine, Urine  Date Value Ref Range Status  10/20/2013 113.6 mg/dL Final         Failed - K in normal range and within 180 days    Potassium  Date Value Ref Range Status  07/20/2020 4.5 3.5 - 5.2 mmol/L Final         Failed - Na in normal range and within 180 days    Sodium  Date Value Ref Range Status  07/20/2020 138 134 - 144 mmol/L Final         Failed - eGFR is 30 or above and within 180 days    GFR, Est African American  Date Value Ref Range Status  11/06/2014 >89 mL/min Final   GFR calc Af Amer  Date Value Ref Range Status  07/20/2020 95 >59 mL/min/1.73 Final    Comment:    **In accordance with recommendations from the NKF-ASN Task force,**   [facility] is in the process of updating its eGFR calculation to the   2021 CKD-EPI creatinine equation that estimates kidney function   without a race variable.    GFR, Est Non African American  Date Value Ref Range Status  11/06/2014 82 mL/min Final    Comment:      The estimated GFR is a calculation valid for adults (>=32 years old) that uses the CKD-EPI algorithm to adjust for age and sex. It is  not to be used for children, pregnant women, hospitalized patients,     patients on dialysis, or with rapidly changing kidney function. According to the NKDEP, eGFR >89 is normal, 60-89 shows mild impairment, 30-59 shows moderate impairment, 15-29 shows severe impairment and <15 is ESRD.      GFR calc non Af Amer  Date Value Ref Range Status  07/20/2020 82 >59 mL/min/1.73 Final         Failed - Last BP in normal range    BP Readings from Last 1 Encounters:  04/29/21 (!) 182/111         Failed - Valid encounter within last 6 months    Recent Outpatient Visits          7 months ago Type 2 diabetes mellitus with other neurologic complication, without long-term current use of insulin ([place])   [place], Charlane Ferretti, MD   9 months ago [place], Charlane Ferretti, MD   1 year ago Type 2 diabetes mellitus with other neurologic complication, without long-term current use of insulin ([place])   [place], Charlane Ferretti, MD   1 year ago Type 2 diabetes mellitus with diabetic polyneuropathy, without long-term current use of insulin ([place])   [place], Charlane Ferretti, MD   2 years ago Type 2 diabetes mellitus with other neurologic complication, without long-term current use of insulin ([place])   [place], Enobong, MD      Future Appointments            In 2 months Charlott Rakes, MD [place]
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
## 2021-11-02 ENCOUNTER — Telehealth: Payer: Self-pay | Admitting: *Deleted

## 2021-11-02 NOTE — Telephone Encounter (Signed)
Prescription was sent to [facility] pharmacy on 10/20/2021.  He needs to schedule an office visit. ?

## 2021-11-02 NOTE — Telephone Encounter (Signed)
Does patient need to come in for labs before refill. ?

## 2021-11-02 NOTE — Telephone Encounter (Signed)
alyCopied from [place] [id number]. Topic: General - Other ?>> Nov 01, 2021  4:18 PM Camille Bal, [place] wrote: ?Reason for CRM:pt was refused amlodipine 10 mg med, because needs labs done. Please call back. ?

## 2021-11-03 NOTE — Telephone Encounter (Signed)
Pt was called and informed of medication being sent to pharmacy. 

## 2021-11-09 ENCOUNTER — Other Ambulatory Visit: Payer: Self-pay | Admitting: Family Medicine

## 2021-11-09 DIAGNOSIS — E1159 Type 2 diabetes mellitus with other circulatory complications: Secondary | ICD-10-CM

## 2021-11-30 ENCOUNTER — Other Ambulatory Visit: Payer: Self-pay

## 2021-12-20 ENCOUNTER — Encounter (HOSPITAL_COMMUNITY): Payer: Self-pay

## 2021-12-20 ENCOUNTER — Ambulatory Visit (HOSPITAL_COMMUNITY)
Admission: EM | Admit: 2021-12-20 | Discharge: 2021-12-20 | Disposition: A | Discharge status: Home / Self Care | Payer: Medicare Other | Attending: Internal Medicine | Admitting: Internal Medicine

## 2021-12-20 DIAGNOSIS — T148XXA Other injury of unspecified body region, initial encounter: Secondary | ICD-10-CM | POA: Insufficient documentation

## 2021-12-20 DIAGNOSIS — L089 Local infection of the skin and subcutaneous tissue, unspecified: Secondary | ICD-10-CM

## 2021-12-20 DIAGNOSIS — M7989 Other specified soft tissue disorders: Secondary | ICD-10-CM

## 2021-12-20 LAB — BASIC METABOLIC PANEL
Anion gap: 8 (ref 5–15)
BUN: 15 mg/dL (ref 8–23)
CO2: 25 mmol/L (ref 22–32)
Calcium: 9.2 mg/dL (ref 8.9–10.3)
Chloride: 101 mmol/L (ref 98–111)
Creatinine, Ser: 1.01 mg/dL (ref 0.61–1.24)
GFR, Estimated: >60 mL/min (ref >60)
Glucose, Bld: 163 mg/dL — ABNORMAL HIGH (ref 70–99)
Potassium: 3.5 mmol/L (ref 3.5–5.1)
Sodium: 134 mmol/L — ABNORMAL LOW (ref 135–145)

## 2021-12-20 MED ORDER — FUROSEMIDE 20 MG PO TABS: 20.0000 mg | ORAL_TABLET | Freq: Every day | ORAL | 0 refills | Status: DC

## 2021-12-20 MED ORDER — POTASSIUM CHLORIDE ER 10 MEQ PO TBCR: 10.0000 meq | EXTENDED_RELEASE_TABLET | Freq: Every day | ORAL | 0 refills | Status: DC

## 2021-12-20 MED ORDER — CEPHALEXIN 500 MG PO CAPS: 500.0000 mg | ORAL_CAPSULE | Freq: Three times a day (TID) | ORAL | 0 refills | Status: DC

## 2021-12-20 MED ORDER — ACETAMINOPHEN 500 MG PO TABS: 500.0000 mg | ORAL_TABLET | Freq: Four times a day (QID) | ORAL | 0 refills | Status: DC | PRN

## 2021-12-20 NOTE — ED Triage Notes (Signed)
Approx 2 wk h/o open wound of RLE. Pt reports wound does not appear to be healing. He is concerned due to h/o DM. ?PCP appt 6/13. ?

## 2021-12-20 NOTE — Discharge Instructions (Addendum)
You were seen today for your wound to your right leg.  I have prescribed an antibiotic called Keflex for you to take for the next 7 days 3 times daily with food.  You may take Tylenol 1000 mg every 6 hours as needed for pain to your right lower extremity wound. ? ?I checked your kidney function today.  You will receive a phone call regarding these results if they are abnormal in the next 2 to 3 days.  If your results are normal, you will not receive a phone call from [place]. ? ?Please call the cardiologist that I have given you the phone number for regarding your leg swelling.  I would like for them to see you to evaluate your heart function.  Call them tomorrow to schedule an appointment.  In the meantime, I have given you a prescription for a fluid pill called Lasix to be taken once a day for 3 days.  Please also take potassium for 3 days with this medication to avoid losing too much potassium through your urine while on this medication. ? ?I would also like for you to contact a wound care specialist to take care of your wound to your right lower extremity. The phone number is on your paperwork.  ? ?If you develop any new or worsening symptoms or do not improve in the next 2 to 3 days, please return.  If your symptoms are severe, please go to the emergency room.  Follow-up with your primary care provider for further evaluation and management of your symptoms as well as ongoing wellness visits.  I hope you feel better! ?

## 2021-12-20 NOTE — ED Provider Notes (Signed)
?[place] ? ? ? ?CSN: [id number] ?Arrival date & time: 12/20/21  1050 ? ? ?  ? ?History   ?Chief Complaint ?Chief Complaint  ?Patient presents with  ? Wound Check  ?  RLE  ? ? ?HPI ?Anthony Barnes is a 66 y.o. male.  ? ?Patient presents urgent care with complaint of right lower extremity swelling and wound that he has had for the last month.  He states that he "bumped it on something and now there is a hole".  Wound is to the lateral aspect of his right shin about 3 to 4 inches above his lateral malleolus.  The wound is approximately 2 cm in diameter and is spherical.  He states that it is very very painful and rates it of a 15 on a scale of 0-10 at this time.  He attempted to call his primary care provider for an appointment, but he was unable to get one until June 13.  He has a significant past medical history of diabetes, hypertension, and hyperlipidemia.  He has a history of delayed wound healing in the past to the same leg.  He also has bilateral lower extremity swelling that he states improves after he walks.  He denies shortness of breath, chest pain, nausea, dizziness, fever, headache.  He denies taking any medication for his pain to his right lower extremity wound.  Denies history of kidney problems in the past, but states that he is not on a diuretic at this time.  Denies calf pain bilaterally at this time and changes in weight.  Denies any other aggravating or relieving factors.  ? ? ?Wound Check ? ? ?Past Medical History:  ?Diagnosis Date  ? Diabetes mellitus without complication ([place])   ? Hypertension   ? ? ?Patient Active Problem List  ? Diagnosis Date Noted  ? Hyperlipidemia 06/25/2018  ? Morbid obesity ([place]) 03/13/2016  ? Dental caries 09/09/2015  ? Diabetes ([place]) 10/20/2013  ? DM (diabetes mellitus) type 2, uncontrolled, with ketoacidosis ([place]) 04/01/2013  ? Essential hypertension, benign 04/01/2013  ? GERD (gastroesophageal reflux disease) 04/01/2013  ? Erosive gastritis 03/22/2013  ?  DM2 (diabetes mellitus, type 2) ([place]) 03/21/2013  ? Unspecified essential hypertension 03/21/2013  ? Hyponatremia 03/21/2013  ? Epigastric pain 03/20/2013  ? HTN (hypertension) 03/20/2013  ? ? ?Past Surgical History:  ?Procedure Laterality Date  ? BRAIN SURGERY    ? ESOPHAGOGASTRODUODENOSCOPY N/A 03/22/2013  ? Procedure: ESOPHAGOGASTRODUODENOSCOPY (EGD);  Surgeon: Wonda Horner, MD;  Location: [facility] ENDOSCOPY;  Service: Endoscopy;  Laterality: N/A;  ? ? ? ? ? ?Home Medications   ? ?Prior to Admission medications   ?Medication Sig Start Date End Date Taking? Authorizing Provider  ?acetaminophen (TYLENOL) 500 MG tablet Take 1 tablet (500 mg total) by mouth every 6 (six) hours as needed. 12/20/21   Talbot Grumbling, FNP  ?amLODipine (NORVASC) 10 MG tablet Take 1 tablet (10 mg total) by mouth daily. 10/20/21   Charlott Rakes, MD  ?atorvastatin (LIPITOR) 40 MG tablet Take 1 tablet (40 mg total) by mouth daily. 03/21/21   Charlott Rakes, MD  ?bacitracin ointment Apply 1 application topically 2 (two) times daily. 04/29/21   Pearson Forster, NP  ?Blood Glucose Monitoring Suppl (TRUE METRIX METER) w/Device KIT Check blood sugar 3 times daily. 12/18/19   Charlott Rakes, MD  ?carvedilol (COREG) 25 MG tablet Take 1 tablet (25 mg total) by mouth 2 (two) times daily with a meal. 03/21/21   Charlott Rakes, MD  ?  cephALEXin (KEFLEX) 500 MG capsule Take 1 capsule (500 mg total) by mouth 3 (three) times daily. 12/20/21   Talbot Grumbling, FNP  ?cloNIDine (CATAPRES) 0.3 MG tablet Take 1 tablet (0.3 mg total) by mouth 2 (two) times daily. 10/20/21   Charlott Rakes, MD  ?furosemide (LASIX) 20 MG tablet Take 1 tablet (20 mg total) by mouth daily for 3 days. 12/20/21 12/23/21  Talbot Grumbling, FNP  ?glipiZIDE (GLUCOTROL) 10 MG tablet TAKE ONE TABLET BY MOUTH TWICE DAILY BEFORE MEALS 10/20/21   Charlott Rakes, MD  ?glucose blood (TRUE METRIX BLOOD GLUCOSE TEST) test strip CHECK BLOOD SUGAR 3 TIMES DAILY. 02/22/21 02/21/22  Charlott Rakes, MD   ?ibuprofen (ADVIL,MOTRIN) 800 MG tablet Take 1 tablet (800 mg total) by mouth every 12 (twelve) hours as needed. 08/01/18   Charlott Rakes, MD  ?lisinopril-hydrochlorothiazide (ZESTORETIC) 20-12.5 MG tablet TAKE TWO TABLETS BY MOUTH DAILY 11/09/21   Charlott Rakes, MD  ?Multiple Vitamin (MULTIVITAMIN WITH MINERALS) TABS tablet Take 1 tablet by mouth daily.    [provider]  ?omega-3 acid ethyl esters (LOVAZA) 1 g capsule Take 1 g by mouth 2 (two) times daily.    [provider]  ?potassium chloride (KLOR-CON) 10 MEQ tablet Take 1 tablet (10 mEq total) by mouth daily. 12/20/21   Talbot Grumbling, FNP  ?TRUEplus Lancets 28G MISC CHECK BLOOD SUGAR 3 TIMES DAILY. 02/22/21   Charlott Rakes, MD  ? ? ?Family History ?Family History  ?Problem Relation Age of Onset  ? Hypertension Mother   ? Cancer Mother 66  ?     ovarian  ? Diabetes Father   ? Stroke Father   ? ? ?Social History ?Social History  ? ?Tobacco Use  ? Smoking status: Former  ?  Packs/day: 1.00  ?  Years: 16.00  ?  Pack years: 16.00  ?  Types: Cigarettes  ?  Start date: 08/14/1974  ?  Quit date: 08/14/1990  ?  Years since quitting: 31.3  ? Smokeless tobacco: Former  ?  Quit date: 08/14/1990  ?Vaping Use  ? Vaping Use: Never used  ?Substance Use Topics  ? Alcohol use: Yes  ?  Alcohol/week: 4.0 standard drinks  ?  Types: 2 Cans of beer, 2 Shots of liquor per week  ?  Comment: occas  ? Drug use: No  ? ? ? ?Allergies   ?Shellfish allergy and Codeine ? ? ?Review of Systems ?Review of Systems ?Per HPI ? ?Physical Exam ?Triage Vital Signs ?ED Triage Vitals  ?Enc Vitals Group  ?   BP 12/20/21 1155 (!) 149/91  ?   Pulse Rate 12/20/21 1155 66  ?   Resp 12/20/21 1155 18  ?   Temp 12/20/21 1155 97.7 ?F (36.5 ?C)  ?   Temp Source 12/20/21 1155 Oral  ?   SpO2 12/20/21 1155 95 %  ?   Weight --   ?   Height --   ?   Head Circumference --   ?   Peak Flow --   ?   Pain Score 12/20/21 1154 0  ?   Pain Loc --   ?   Pain Edu? --   ?   Excl. in [place]? --   ? ?No data  found. ? ?Updated Vital Signs ?BP (!) 149/91 (BP Location: Right Arm)   Pulse 66   Temp 97.7 ?F (36.5 ?C) (Oral)   Resp 18   SpO2 95%  ? ?Visual Acuity ?Right Eye Distance:   ?  Left Eye Distance:   ?Bilateral Distance:   ? ?Right Eye Near:   ?Left Eye Near:    ?Bilateral Near:    ? ?Physical Exam ?Vitals and nursing note reviewed.  ?Constitutional:   ?   General: He is not in acute distress. ?   Appearance: Normal appearance. He is well-developed. He is not ill-appearing.  ?   Comments: Very pleasant patient sitting comfortably on exam able in no acute distress.   ?HENT:  ?   Head: Normocephalic and atraumatic.  ?   Right Ear: Tympanic membrane, ear canal and external ear normal.  ?   Left Ear: Tympanic membrane, ear canal and external ear normal.  ?   Nose: Nose normal.  ?   Mouth/Throat:  ?   Mouth: Mucous membranes are moist.  ?   Pharynx: No posterior oropharyngeal erythema.  ?Eyes:  ?   General: Lids are normal. Vision grossly intact. Gaze aligned appropriately.  ?   Extraocular Movements: Extraocular movements intact.  ?   Conjunctiva/sclera: Conjunctivae normal.  ?   Right eye: Right conjunctiva is not injected.  ?   Left eye: Left conjunctiva is not injected.  ?Cardiovascular:  ?   Rate and Rhythm: Normal rate and regular rhythm.  ?   Heart sounds: Normal heart sounds, S1 normal and S2 normal. No murmur heard. ?  No friction rub. No gallop.  ?Pulmonary:  ?   Effort: Pulmonary effort is normal. No respiratory distress.  ?   Breath sounds: Normal breath sounds. No decreased air movement.  ?Abdominal:  ?   Palpations: Abdomen is soft.  ?   Tenderness: There is no right CVA tenderness.  ?Musculoskeletal:  ?   Cervical back: Neck supple.  ?   Right lower leg: Edema present.  ?   Left lower leg: Edema present.  ?Lymphadenopathy:  ?   Cervical: No cervical adenopathy.  ?Skin: ?   General: Skin is warm and dry.  ?   Capillary Refill: Capillary refill takes less than 2 seconds.  ?   Findings: No rash.  ?    Comments: Wound to the lateral aspect of patient's right shin that is approximately 3-4 inches above his lateral malleolus that is approximately 2cm in diameter and spherical. Yellow and white drainage noted to w

## 2022-01-02 ENCOUNTER — Encounter (HOSPITAL_BASED_OUTPATIENT_CLINIC_OR_DEPARTMENT_OTHER): Payer: Medicare Other | Attending: General Surgery | Admitting: General Surgery

## 2022-01-02 DIAGNOSIS — Z6838 Body mass index (BMI) 38.0-38.9, adult: Secondary | ICD-10-CM | POA: Insufficient documentation

## 2022-01-02 DIAGNOSIS — I872 Venous insufficiency (chronic) (peripheral): Secondary | ICD-10-CM | POA: Diagnosis not present

## 2022-01-02 DIAGNOSIS — Z87891 Personal history of nicotine dependence: Secondary | ICD-10-CM | POA: Insufficient documentation

## 2022-01-02 DIAGNOSIS — E11622 Type 2 diabetes mellitus with other skin ulcer: Secondary | ICD-10-CM | POA: Diagnosis not present

## 2022-01-02 DIAGNOSIS — R600 Localized edema: Secondary | ICD-10-CM | POA: Diagnosis not present

## 2022-01-02 DIAGNOSIS — L97812 Non-pressure chronic ulcer of other part of right lower leg with fat layer exposed: Secondary | ICD-10-CM | POA: Insufficient documentation

## 2022-01-02 DIAGNOSIS — I10 Essential (primary) hypertension: Secondary | ICD-10-CM | POA: Insufficient documentation

## 2022-01-02 NOTE — Progress Notes (Signed)
RIP, HAWES ([id number]) Visit Report for 01/02/2022 Abuse Risk Screen Details Patient Name: Date of Service: Anthony Barnes [name] 01/02/2022 1:30 PM Medical Record Number: [id number] Patient Account Number: [id number] Date of Birth/Sex: Treating RN: Oct 01, 1955 (66 y.o. [name] Primary Care Hagen Tidd: Hoy Register Other Clinician: Referring Isys Tietje: Treating Icker Swigert/Extender: Camillo Flaming Weeks in Treatment: 0 Abuse Risk Screen Items Answer ABUSE RISK SCREEN: Has anyone close to you tried to hurt or harm you recentlyo No Do you feel uncomfortable with anyone in your familyo No Has anyone forced you do things that you didnt want to doo No Electronic Signature(s) Signed: 01/02/2022 5:28:22 PM By: Samuella Bruin Entered By: Samuella Bruin on 01/02/2022 13:51:15 -------------------------------------------------------------------------------- Activities of Daily Living Details Patient Name: Date of Service: Anthony Barnes [name] 01/02/2022 1:30 PM Medical Record Number: [id number] Patient Account Number: [id number] Date of Birth/Sex: Treating RN: July 10, 1956 (66 y.o. [name] Primary Care Amayra Kiedrowski: Hoy Register Other Clinician: Referring Jamiee Milholland: Treating Bernard Slayden/Extender: Camillo Flaming Weeks in Treatment: 0 Activities of Daily Living Items Answer Activities of Daily Living (Please select one for each item) Drive Automobile Completely Able T Medications ake Completely Able Use T elephone Completely Able Care for Appearance Completely Able Use T oilet Completely Able Bath / Shower Completely Able Dress Self Completely Able Feed Self Completely Able Walk Completely Able Get In / Out Bed Completely Able Housework Completely Able Prepare Meals Completely Able Handle Money Completely Able Shop for Self Completely Able Electronic Signature(s) Signed: 01/02/2022 5:28:22 PM By: Samuella Bruin Entered By: Samuella Bruin on 01/02/2022 13:52:06 -------------------------------------------------------------------------------- Education Screening Details Patient Name: Date of Service: Anthony Harbor, [name] 01/02/2022 1:30 PM Medical Record Number: [id number] Patient Account Number: [id number] Date of Birth/Sex: Treating RN: 11-13-1955 (66 y.o. [name] Primary Care Masiyah Engen: Hoy Register Other Clinician: Referring Petra Sargeant: Treating Ruthvik Barnaby/Extender: Camillo Flaming Weeks in Treatment: 0 Primary Learner Assessed: Patient Learning Preferences/Education Level/Primary Language Learning Preference: Explanation, Demonstration, Video, Printed Material Highest Education Level: High School Preferred Language: English Cognitive Barrier Language Barrier: No Translator Needed: No Memory Deficit: No Emotional Barrier: No Cultural/Religious Beliefs Affecting Medical Care: Yes Jehovah's witness - no blood products Physical Barrier Impaired Vision: No Impaired Hearing: No Decreased Hand dexterity: No Knowledge/Comprehension Knowledge Level: Medium Comprehension Level: Medium Ability to understand written instructions: Medium Ability to understand verbal instructions: Medium Motivation Anxiety Level: Calm Cooperation: Cooperative Education Importance: Acknowledges Need Interest in Health Problems: Asks Questions Perception: Coherent Willingness to Engage in Self-Management Medium Activities: Readiness to Engage in Self-Management Medium Activities: Electronic Signature(s) Signed: 01/02/2022 5:28:22 PM By: Samuella Bruin Entered By: Samuella Bruin on 01/02/2022 13:52:57 -------------------------------------------------------------------------------- Fall Risk Assessment Details Patient Name: Date of Service: Anthony Harbor, [name] 01/02/2022 1:30 PM Medical Record Number: [id number] Patient Account Number: [id number] Date of  Birth/Sex: Treating RN: November 29, 1955 (66 y.o. [name] Primary Care Jorge Amparo: Hoy Register Other Clinician: Referring Lorianna Spadaccini: Treating Samba Cumba/Extender: Camillo Flaming Weeks in Treatment: 0 Fall Risk Assessment Items Have you had 2 or more falls in the last 12 monthso 0 No Have you had any fall that resulted in injury in the last 12 monthso 0 No FALLS RISK SCREEN History of falling - immediate or within 3 months 0 No Secondary diagnosis (Do you have 2 or more medical diagnoseso) 0 No Ambulatory aid None/bed rest/wheelchair/nurse 0 No Crutches/cane/walker 0 No Furniture 0 No Intravenous therapy Access/Saline/Heparin Lock 0 No Gait/Transferring Normal/ bed rest/ wheelchair 0  No Weak (short steps with or without shuffle, stooped but able to lift head while walking, may seek 0 No support from furniture) Impaired (short steps with shuffle, may have difficulty arising from chair, head down, impaired 0 No balance) Mental Status Oriented to own ability 0 No Electronic Signature(s) Signed: 01/02/2022 5:28:22 PM By: Samuella Bruin Entered By: Samuella Bruin on 01/02/2022 13:57:40 -------------------------------------------------------------------------------- Foot Assessment Details Patient Name: Date of Service: Anthony Harbor, [name] 01/02/2022 1:30 PM Medical Record Number: [id number] Patient Account Number: [id number] Date of Birth/Sex: Treating RN: 1956-02-28 (66 y.o. [name] Primary Care Thena Devora: Hoy Register Other Clinician: Referring Graig Hessling: Treating Shacoria Latif/Extender: Camillo Flaming Weeks in Treatment: 0 Foot Assessment Items Site Locations + = Sensation present, - = Sensation absent, C = Callus, U = Ulcer Barnes = Redness, W = Warmth, M = Maceration, PU = Pre-ulcerative lesion F = Fissure, S = Swelling, D = Dryness Assessment Right: Left: Other Deformity: No No Prior Foot Ulcer: No No Prior  Amputation: No No Charcot Joint: No No Ambulatory Status: Ambulatory Without Help Gait: Steady Electronic Signature(s) Signed: 01/02/2022 5:28:22 PM By: Samuella Bruin Entered By: Samuella Bruin on 01/02/2022 14:07:35 -------------------------------------------------------------------------------- Nutrition Risk Screening Details Patient Name: Date of ServiceMurlean Barnes [name] 01/02/2022 1:30 PM Medical Record Number: [id number] Patient Account Number: [id number] Date of Birth/Sex: Treating RN: [birth year] (66 y.o. [name] Primary Care Nyelli Samara: [place]oy Register Other Clinician: Referring Elaysha Bevard: Treating Octivia Canion/Extender: Camillo Flaming Weeks in Treatment: 0 Height (in): 75 Weight (lbs): 310 Body Mass Index (BMI): 38.7 Nutrition Risk Screening Items Score Screening NUTRITION RISK SCREEN: I have an illness or condition that made me change the kind and/or amount of food I eat 0 No I eat fewer than two meals per day 0 No I eat few fruits and vegetables, or milk products 0 No I have three or more drinks of beer, liquor or wine almost every day 0 No I have tooth or mouth problems that make it hard for me to eat 0 No I don't always have enough money to buy the food I need 0 No I eat alone most of the time 0 No I take three or more different prescribed or over-the-counter drugs a day 1 Yes Without wanting to, I have lost or gained 10 pounds in the last six months 0 No I am not always physically able to shop, cook and/or feed myself 0 No Nutrition Protocols Good Risk Protocol 0 No interventions needed Moderate Risk Protocol High Risk Proctocol Risk Level: Good Risk Score: 1 Electronic Signature(s) Signed: 01/02/2022 5:28:22 PM By: Samuella Bruin Entered By: Samuella Bruin on 01/02/2022 14:03:30

## 2022-01-02 NOTE — Progress Notes (Signed)
Anthony, Barnes ([id number]) Visit Report for 01/02/2022 Chief Complaint Document Details Patient Name: Date of Service: Anthony Barnes [name] 01/02/2022 1:30 PM Medical Record Number: [id number] Patient Account Number: [id number] Date of Birth/Sex: Treating RN: June 27, 1956 (66 y.o. [name] Primary Care Provider: Hoy Register Other Clinician: Referring Provider: Treating Provider/Extender: Camillo Flaming Weeks in Treatment: 0 Information Obtained from: Patient Chief Complaint Right LE Ulcer Electronic Signature(s) Signed: 01/02/2022 2:46:21 PM By: Duanne Guess MD FACS Entered By: Duanne Guess on 01/02/2022 14:46:21 -------------------------------------------------------------------------------- Debridement Details Patient Name: Date of Service: Anthony Barnes, [name] 01/02/2022 1:30 PM Medical Record Number: [id number] Patient Account Number: [id number] Date of Birth/Sex: Treating RN: 1955-11-02 (66 y.o. [name] Primary Care Provider: Hoy Register Other Clinician: Referring Provider: Treating Provider/Extender: Camillo Flaming Weeks in Treatment: 0 Debridement Performed for Assessment: Wound #2 Right,Lateral Lower Leg Performed By: Physician Duanne Guess, MD Debridement Type: Debridement Severity of Tissue Pre Debridement: Fat layer exposed Level of Consciousness (Pre-procedure): Awake and Alert Pre-procedure Verification/Time Out Yes - 14:28 Taken: Start Time: 14:28 Pain Control: Lidocaine 4% T opical Solution T Area Debrided (L x W): otal 1.5 (cm) x 0.9 (cm) = 1.35 (cm) Tissue and other material debrided: Viable, Non-Viable, Eschar, Skin: Epidermis Level: Skin/Epidermis Debridement Description: Selective/Open Wound Instrument: Curette Bleeding: Minimum Hemostasis Achieved: Pressure Procedural Pain: 10 Post Procedural Pain: 2 Response to Treatment: Procedure was tolerated well Level of  Consciousness (Post- Awake and Alert procedure): Post Debridement Measurements of Total Wound Length: (cm) 1.5 Width: (cm) 0.9 Depth: (cm) 0.1 Volume: (cm) 0.106 Character of Wound/Ulcer Post Debridement: Improved Severity of Tissue Post Debridement: Fat layer exposed Post Procedure Diagnosis Same as Pre-procedure Electronic Signature(s) Signed: 01/02/2022 2:58:20 PM By: Duanne Guess MD FACS Signed: 01/02/2022 5:28:22 PM By: Samuella Bruin Entered By: Samuella Bruin on 01/02/2022 14:28:45 -------------------------------------------------------------------------------- HPI Details Patient Name: Date of Service: Anthony Barnes, [name] 01/02/2022 1:30 PM Medical Record Number: [id number] Patient Account Number: [id number] Date of Birth/Sex: Treating RN: Jun 21, 1956 (66 y.o. [name] Primary Care Provider: Hoy Register Other Clinician: Referring Provider: Treating Provider/Extender: Camillo Flaming Weeks in Treatment: 0 History of Present Illness HPI Description: ADMISSION 03/12/18 This is a 67 year old and it was a type II diabetic reasonably poorly controlled with a recent hemoglobin A1c of 8.2. He was tending to his lawn on 01/15/18 when his weed [name] sent a rock up word hitting him in the right anterior leg. He was seen in his primary M.D. office is same time. An x-ray showed no abnormality. He was given a course of cephalexin. Since then he's been applying peroxide and topical antibiotics to the wound. He does not have a history of chronic wounds however he does have chronic edema in the lower legs. He is complaining of pain in the right leg wound but no real history of claudication. Past medical history includes type 2 diabetes, hypertension, morbid obesity. ABIs in the right leg were noncompressible 03/19/18 on evaluation today patient appears to be tolerating the wrap in general fairly well. He states he's not having that much pain in regard  to the wound itself. With that being said he is having some issues with slough buildup on the surface of the wound the Iodoflex does seem to be helpful in that regard. He is still having discomfort he wonders if I can send in a prescription for ibuprofen or something of like to help him out. I do believe that the prox  end may be of benefit for him. 03/25/18 on evaluation today patient appears to be doing very well in regard to his right lateral lower [name]. He's been tolerating the dressing changes without complication that is the wraps. Nonetheless he does continue to have pain he states is been dreading the possibility of having to have debridement again today. His first debridement experience was not optimal. With that being said he does seem to be showing signs of improvement there does appear to be little bit more granulation he does have a lot of slough that really does need to breeding away. 04/04/18;the patient went for arterial studies that were really quite normal. ABI on the right at 1.19 with triphasic waveforms on the left 1.11 with triphasic waveforms. TBI 0.93 on the right and 0.95 on the left. This suggests T should be able to tolerate even 4 layer compression. He is however complaining of a lot of pain with our wraps I'm wondering whether the pain is simply Iodoflex. I changed him to [employer] covering Hydrofera Blue. I'm still going to try to keep him in 3 layer compression 04/12/18 on evaluation today patient actually appears to be doing rather well in regard to his right lower [name] ulcer. He is making good progress although it is slow still I do believe that the Santyl is much better than the Iodoflex. The good news is the patient seems to be tolerating the dressing changes without complication now that we've gotten good of the Iodoflex which really did burn him quite significantly. Otherwise there's no evidence of infection. 04/17/18 on evaluation today patient  actually appears to be showing signs of improvement in regard to the ulcer. Unfortunately he's had somewhat of a rough day due to the fact that he found out that one of his friends suddenly and unexpectedly passed today. He states he's not sure he's in the frame of mind to allow me to debride the wound at this point. Nonetheless I do feel like he is showing signs of the wound getting better little by little each week. 04/24/18 on evaluation today patient's ulcer actually appears to be showing some signs of improvement albeit slow. He has been tolerating the dressing changes without complication except for the wrap which she states was so tight that he had to remove it it was causing a lot of discomfort. Fortunately there is no evidence of infection at this point. He states he only wore the wrap until about the time he got home last week and that he had to take it off. Nonetheless he does not have any other compression stockings, Juxta-Lite wraps, or anything otherwise to really help at this point as far as compression is concerned. 05/01/18 on evaluation today patient actually appears to be doing fairly well in regard to his lower extremity ulcer. He has been tolerating the dressing changes currently without complication. The wrap does seem to be helping as far as the fluid management is concerned. Wound bed itself actually show signs of good improvement although there is some Slough noted there's not as much as previous and he actually has fairly decent granulation noted as well. Overall I'm pleased with the progress of to this point. The patient would prefer not to have any debridement today he states he's actually not feeling too well in general and he really does not want any additional discomfort typically debridement is fairly uncomfortable for him. 05/08/18 on evaluation today patient actually appears to be doing a little better  in regard to his lower extremity ulcer. He has been tolerating the  dressing changes without complication. With that being said I'm actually quite pleased with the fact the wound is not appear to be infected and in general has made a little progress. With that being said I do believe we need to perform some debridement to clean away the necrotic tissue on the surface of the wound. 05/15/18 on evaluation today patient actually appears to be doing a little better in regard to his wound. Fortunately there is some improvement noted fortunately there's also no significant evidence of infection at this point in time. He does have continued pain that really nothing different than previous he did get his Juxta-Lite wrap. 05/29/18 on evaluation today patient actually appears to be doing somewhat better in regard to his wound currently. He's been tolerating the dressing changes without complication. With that being said he has been performing the [facility] Dressing changes every day at home. I'm pretty sure that we told him every other day last week or when I saw him rather two weeks ago. With that being said obviously did not hurt to do it daily just that obviously is gonna run out of supplies sooner and that could get him into trouble as far as his insurance is concerned. Nonetheless he at this point still continues to have pain although honestly I don't feel like it's as bad as what it was in the past just based on his reactions at this point. 06/12/18 on evaluation today patient actually appears to be doing better in regard to his lower [name] ulcer. He's been tolerating the dressing changes without complication. Fortunately there does not appear to be any evidence of infection at this time. No fevers, chills, nausea, or vomiting noted at this time. 06/19/18 evaluation today patient's ulcer on the lower extremity appears to be doing better at this point. he has been tolerating the dressing changes. The patient seems to be somewhat depressed about the progress of his  wound although the overall appearance at this time seems to be doing well. In general I'm very pleased with the appearance today he does have some biofilm on the surface of the wound as well as of hyper granular tissue we may want to utilize a little bit of silver nitrate today. 06/27/2018; patient comes into clinic today for a wound on the right lateral calf likely related to chronic venous insufficiency. He has been using medihoney covered with Hydrofera Blue. Wound surface actually looks quite good 07/03/2018 seen today for follow-up and management of lower extremity ulcer right lateral shin. T olerating dressing changes. He has a small amount of biofilm today. Will attempt to remove a portion of the biofilm as he tolerates. He becomes very anxious with wound treatments. He would benefit from layer compression wraps however he refuses compression wraps or anything that smokes his legs. He expressed an inability to tolerate compression wraps. Juxta lite wraps have been ordered. He has been instructed on the appropriate application of the juxta leg wraps. His blood pressure today on visit was 200/120. He denies any blurred vision, chest pain, dizziness, shortness of breath, or difficulty with mobility. Mr. Kuzel stated that he had a recent visit with his primary care provider. At that time his carvedilol was decreased from 25 mg daily to 12.5 mg daily. Strongly encouraged Mr. Pense to seek medical attention today during visit. He declined transport for evaluation of elevated blood pressure. Encouraged him to contact his primary care  provider today for medication management. He stated that he would call his primary care doctor after wound visit today. Also encouraged him that if he experienced any symptoms of blurred vision, chest pain, shortness of breath to immediately seek medical attention. 07/17/18 on evaluation today patient's wound actually does seem to show signs of improvement based on the  overall appearance of the wound bed today. There does not appear to show any signs of infection which is good news. There is no overall worsening which is also good news. He still has hyper granular tissue will use in the [employer] followed by [facility] Dressing this point. 07/31/18 on evaluation today patient's wound bed actually show signs of improvement with good epithelialization especially in the upper portion of the wound. He has been tolerating the dressing changes without complication in general. Overall I'm extremely happy with how things stand. No fevers, chills, nausea, or vomiting noted at this time. 08/28/18 on evaluation today patient appears to be doing a little better in regard to his lower extremity ulcer. With that being said it appears to be very hyper granular I think this is directly attributed to the fact that he continues to use the [employer] underneath the [facility] Dressing. Although this helps not to stick unfortunately also think he's not getting the benefit of the [facility] Dressing particular and subsequently is causing too much moisture buildup hence the hyper granulation. Fortunately there does not appear to be any signs of infection at this time which is good news. 09/03/18 on evaluation today patient appears to be doing well in regard to his lower [name] ulcer. He has been tolerating the dressing changes without complication. Fortunately he has much less hyper granular tissue the noted last week I do think using silver nitrate in changing to using the [facility] Dressing without the mepitel has made a big difference for him this is good news. 09/18/18 on evaluation today patient appears to be doing excellent in regard to his right lower [name] ulcer. This is actually significantly smaller even compared to the last evaluation. Overall I'm very pleased in this regard. I'm a recommend that we likely repeat the silver nitrate today based on what I'm  seeing. 10/02/18 on evaluation today patient appears to be doing excellent in regard to his lower extremity wound on the right. He's been tolerating the dressing changes without complication. Fortunately there's no signs of infection at this time. Overall been very pleased with how things seem to be progressing. No fevers, chills, nausea, or vomiting noted at this time. 10/16/18 on evaluation today patient actually appears to be doing much better in regard to his lower extremity wound on the right. This is shown signs of good improvement and is indeed measuring smaller he is on a excellent track as far as healing is concerned. My hope is this will be healed of the next several weeks. Fortunately there is no evidence of infection at this time. He does seem to be doing everything that I'm recommending for him 10/30/18 on evaluation today patient appears to be doing better in regard to his lower extremity ulcer. He's been tolerating the dressing changes without complication. Fortunately the Hydrofera Blue Dressing to be doing the job. He has made excellent progress. With that being said this is still going somewhat slow but nonetheless is always making good progress at this point. 5/18-Patient was in to be seen for right leg ulcer that appeared after scab above it was removed today. This area had  healed completely. 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 The wound has not healed though it has been nearly 2 months since the injury occurred. He was seen in the emergency department at [facility] on May 9. At that time it was very painful and he described it as a "15 on a scale of 010". He also was found to have 3+ pitting edema to the  bilateral lower extremities. He was prescribed a weeks course of Keflex. He is here today because the wound has failed to heal and he has a prior history in our clinic. ABI in clinic today was noncompressible. He did have formal vascular studies performed in 2019 which were normal. The last hemoglobin A1c I have available for review was from March 21, 2021 and was elevated at 7.7%. The wound is fairly small and circular located about 3 inches above his right lateral malleolus. It is completely covered with eschar. No surrounding erythema, no odor, no purulent drainage. Electronic Signature(s) Signed: 01/02/2022 2:51:46 PM By: Duanne Guess MD FACS Entered By: Duanne Guess on 01/02/2022 14:51:46 -------------------------------------------------------------------------------- Physical Exam Details Patient Name: Date of Service: Anthony Barnes, [name] 01/02/2022 1:30 PM Medical Record Number: [id number] Patient Account Number: [id number] Date of Birth/Sex: Treating RN: February 21, 1956 (66 y.o. [name] Primary Care Provider: Other Clinician: Hoy Register Referring Provider: Treating Provider/Extender: Camillo Flaming Weeks in Treatment: 0 Constitutional Mildly hypertensive. . . . No acute distress. Respiratory Normal work of breathing on room air.. Cardiovascular 2+ pitting edema to the bilateral lower extremities.. Notes 01/02/2022: The wound is fairly small and circular located about 3 inches above his right lateral malleolus. It is completely covered with eschar. No surrounding erythema, no odor, no purulent drainage. Electronic Signature(s) Signed: 01/02/2022 2:54:19 PM By: Duanne Guess MD FACS Entered By: Duanne Guess on 01/02/2022 14:54:18 -------------------------------------------------------------------------------- Physician Orders Details Patient Name: Date of Service: Anthony Barnes, [name] 01/02/2022 1:30 PM Medical Record Number:  [id number] Patient Account Number: [id number] Date of Birth/Sex: Treating RN: Jun 21, 1956 (66 y.o. [name] Primary Care Provider: Hoy Register Other Clinician: Referring Provider: Treating Provider/Extender: Camillo Flaming Weeks in Treatment: 0 Verbal / Phone Orders: No Diagnosis Coding ICD-10 Coding Code Description [phone] Non-pressure chronic ulcer of other part of right lower leg with fat layer exposed E11.622 Type 2 diabetes mellitus with other skin ulcer E11.9 Type 2 diabetes mellitus without complications I10 Essential (primary) hypertension I87.2 Venous insufficiency (chronic) (peripheral) Follow-up Appointments ppointment in 1 week. - Dr. Lady Gary - Room 2 - 5/31 at 12:45 Return A Bathing/ Shower/ Hygiene May shower with protection but do not get wound dressing(s) wet. - may purchase a cast protector from [facility] or [facility] Edema Control - Lymphedema / SCD / Other Elevate legs to the level of the heart or above for 30 minutes daily and/or when sitting, a frequency of: Avoid standing for long periods of time. Patient to wear own compression stockings every day. Exercise regularly Moisturize legs daily. Compression stocking or Garment 20-30 mm/Hg pressure to: - to L left until R leg is healed Wound Treatment Wound #2 - Lower Leg Wound Laterality: Right, Lateral Cleanser: Soap and Water 1 x Per Week/30 Days Discharge Instructions:  May shower and wash wound with dial antibacterial soap and water prior to dressing change. Cleanser: Wound Cleanser 1 x Per Week/30 Days Discharge Instructions: Cleanse the wound with wound cleanser prior to applying a clean dressing using gauze sponges, not tissue or cotton balls. Peri-Wound Care: Sween Lotion (Moisturizing lotion) 1 x Per Week/30 Days Discharge Instructions: Apply moisturizing lotion as directed Prim Dressing: IODOFLEX 0.9% Cadexomer Iodine Pad 4x6 cm 1 x Per Week/30 Days ary Discharge Instructions:  Apply to wound bed as instructed Secondary Dressing: Woven Gauze Sponge, Non-Sterile 4x4 in 1 x Per Week/30 Days Discharge Instructions: Apply over primary dressing as directed. Secured With: Coban Self-Adherent Wrap 4x5 (in/yd) 1 x Per Week/30 Days Discharge Instructions: Secure with Coban as directed. Secured With: [employer], 4.5x3.1 (in/yd) 1 x Per Week/30 Days Discharge Instructions: Secure with Kerlix as directed. Secured With: 8M Medipore H Soft Cloth Surgical T ape, 4 x 10 (in/yd) 1 x Per Week/30 Days Discharge Instructions: Secure with tape as directed. Consults Vein and Vascular - for noncompressible ABI in clinic - need formal ABIs and TBIs Electronic Signature(s) Signed: 01/02/2022 2:58:20 PM By: Duanne Guess MD FACS Entered By: Duanne Guess on 01/02/2022 14:55:26 Prescription 01/02/2022 -------------------------------------------------------------------------------- Mayra Reel MD Patient Name: Provider: 06-28-1956 [id number] Date of Birth: NPI#Judie Petit [id number] Sex: DEA #: [phone] Phone #: License #: Eligha Bridegroom [facility] Patient Address: [phone] [place], [place] [address] [address] [place], [place] [address] [phone] Allergies Shellfish Containing Products; codeine Provider's Orders Vein and Vascular - for noncompressible ABI in clinic - need formal ABIs and TBIs Hand Signature: Date(s): Electronic Signature(s) Signed: 01/02/2022 2:57:49 PM By: Duanne Guess MD FACS Entered By: Duanne Guess on 01/02/2022 14:57:49 -------------------------------------------------------------------------------- Problem List Details Patient Name: Date of Service: Anthony Barnes, [name] 01/02/2022 1:30 PM Medical Record Number: [id number] Patient Account Number: [id number] Date of Birth/Sex: Treating RN: [birth year] (66 y.o. [name] Primary Care  Provider: Hoy Register Other Clinician: Referring Provider: Treating Provider/Extender: Camillo Flaming Weeks in Treatment: 0 Active Problems ICD-10 Encounter Code Description Active Date MDM Diagnosis L97.812 Non-pressure chronic ulcer of other part of right lower leg with fat layer 01/02/2022 No Yes exposed E11.622 Type 2 diabetes mellitus with other skin ulcer 01/02/2022 No Yes E11.9 Type 2 diabetes mellitus without complications 01/02/2022 No Yes I10 Essential (primary) hypertension 01/02/2022 No Yes I87.2 Venous insufficiency (chronic) (peripheral) 01/02/2022 No Yes Inactive Problems Resolved Problems Electronic Signature(s) Signed: 01/02/2022 2:46:01 PM By: Duanne Guess MD FACS Previous Signature: 01/02/2022 1:52:23 PM Version By: Duanne Guess MD FACS Entered By: Duanne Guess on 01/02/2022 14:46:01 -------------------------------------------------------------------------------- Progress Note Details Patient Name: Date of Service: Anthony Barnes, [name] 01/02/2022 1:30 PM Medical Record Number: [id number] Patient Account Number: [id number] Date of Birth/Sex: Treating RN: 13-Nov-1955 (66 y.o. [name] Primary Care Provider: Hoy Register Other Clinician: Referring Provider: Treating Provider/Extender: Camillo Flaming Weeks in Treatment: 0 Subjective Chief Complaint Information obtained from Patient Right LE Ulcer History of Present Illness (HPI) ADMISSION 03/12/18 This is a 66 year old and it was a type II diabetic reasonably poorly controlled with a recent hemoglobin A1c of 8.2. He was tending to his lawn on 01/15/18 when his weed [name] sent a rock up word hitting him in the right anterior leg. He was seen in his primary M.D. office is same time. An x-ray showed no abnormality. He was given a course of cephalexin. Since  then he's been applying peroxide and topical antibiotics to the wound. He does not have a history  of chronic wounds however he does have chronic edema in the lower legs. He is complaining of pain in the right leg wound but no real history of claudication. Past medical history includes type 2 diabetes, hypertension, morbid obesity. ABIs in the right leg were noncompressible 03/19/18 on evaluation today patient appears to be tolerating the wrap in general fairly well. He states he's not having that much pain in regard to the wound itself. With that being said he is having some issues with slough buildup on the surface of the wound the Iodoflex does seem to be helpful in that regard. He is still having discomfort he wonders if I can send in a prescription for ibuprofen or something of like to help him out. I do believe that the prox end may be of benefit for him. 03/25/18 on evaluation today patient appears to be doing very well in regard to his right lateral lower [name]. He's been tolerating the dressing changes without complication that is the wraps. Nonetheless he does continue to have pain he states is been dreading the possibility of having to have debridement again today. His first debridement experience was not optimal. With that being said he does seem to be showing signs of improvement there does appear to be little bit more granulation he does have a lot of slough that really does need to breeding away. 04/04/18;the patient went for arterial studies that were really quite normal. ABI on the right at 1.19 with triphasic waveforms on the left 1.11 with triphasic waveforms. TBI 0.93 on the right and 0.95 on the left. This suggests T should be able to tolerate even 4 layer compression. He is however complaining of a lot of pain with our wraps I'm wondering whether the pain is simply Iodoflex. I changed him to [employer] I'm still going to try to keep him in 3 layer compression 04/12/18 on evaluation today patient actually appears to be doing rather well in  regard to his right lower [name] ulcer. He is making good progress although it is slow still I do believe that the Santyl is much better than the Iodoflex. The good news is the patient seems to be tolerating the dressing changes without complication now that we've gotten good of the Iodoflex which really did burn him quite significantly. Otherwise there's no evidence of infection. 04/17/18 on evaluation today patient actually appears to be showing signs of improvement in regard to the ulcer. Unfortunately he's had somewhat of a rough day due to the fact that he found out that one of his friends suddenly and unexpectedly passed today. He states he's not sure he's in the frame of mind to allow me to debride the wound at this point. Nonetheless I do feel like he is showing signs of the wound getting better little by little each week. 04/24/18 on evaluation today patient's ulcer actually appears to be showing some signs of improvement albeit slow. He has been tolerating the dressing changes without complication except for the wrap which she states was so tight that he had to remove it it was causing a lot of discomfort. Fortunately there is no evidence of infection at this point. He states he only wore the wrap until about the time he got home last week and that he had to take it off. Nonetheless he does not have any other compression stockings,  Juxta-Lite wraps, or anything otherwise to really help at this point as far as compression is concerned. 05/01/18 on evaluation today patient actually appears to be doing fairly well in regard to his lower extremity ulcer. He has been tolerating the dressing changes currently without complication. The wrap does seem to be helping as far as the fluid management is concerned. Wound bed itself actually show signs of good improvement although there is some Slough noted there's not as much as previous and he actually has fairly decent granulation noted as well. Overall  I'm pleased with the progress of to this point. The patient would prefer not to have any debridement today he states he's actually not feeling too well in general and he really does not want any additional discomfort typically debridement is fairly uncomfortable for him. 05/08/18 on evaluation today patient actually appears to be doing a little better in regard to his lower extremity ulcer. He has been tolerating the dressing changes without complication. With that being said I'm actually quite pleased with the fact the wound is not appear to be infected and in general has made a little progress. With that being said I do believe we need to perform some debridement to clean away the necrotic tissue on the surface of the wound. 05/15/18 on evaluation today patient actually appears to be doing a little better in regard to his wound. Fortunately there is some improvement noted fortunately there's also no significant evidence of infection at this point in time. He does have continued pain that really nothing different than previous he did get his Juxta-Lite wrap. 05/29/18 on evaluation today patient actually appears to be doing somewhat better in regard to his wound currently. He's been tolerating the dressing changes without complication. With that being said he has been performing the [facility] Dressing changes every day at home. I'm pretty sure that we told him every other day last week or when I saw him rather two weeks ago. With that being said obviously did not hurt to do it daily just that obviously is gonna run out of supplies sooner and that could get him into trouble as far as his insurance is concerned. Nonetheless he at this point still continues to have pain although honestly I don't feel like it's as bad as what it was in the past just based on his reactions at this point. 06/12/18 on evaluation today patient actually appears to be doing better in regard to his lower [name] ulcer. He's  been tolerating the dressing changes without complication. Fortunately there does not appear to be any evidence of infection at this time. No fevers, chills, nausea, or vomiting noted at this time. 06/19/18 evaluation today patient's ulcer on the lower extremity appears to be doing better at this point. he has been tolerating the dressing changes. The patient seems to be somewhat depressed about the progress of his wound although the overall appearance at this time seems to be doing well. In general I'm very pleased with the appearance today he does have some biofilm on the surface of the wound as well as of hyper granular tissue we may want to utilize a little bit of silver nitrate today. 06/27/2018; patient comes into clinic today for a wound on the right lateral calf likely related to chronic venous insufficiency. He has been using medihoney covered with Hydrofera Blue. Wound surface actually looks quite good 07/03/2018 seen today for follow-up and management of lower extremity ulcer right lateral shin. T olerating dressing changes.  He has a small amount of biofilm today. Will attempt to remove a portion of the biofilm as he tolerates. He becomes very anxious with wound treatments. He would benefit from layer compression wraps however he refuses compression wraps or anything that smokes his legs. He expressed an inability to tolerate compression wraps. Juxta lite wraps have been ordered. He has been instructed on the appropriate application of the juxta leg wraps. His blood pressure today on visit was 200/120. He denies any blurred vision, chest pain, dizziness, shortness of breath, or difficulty with mobility. Mr. Rochford stated that he had a recent visit with his primary care provider. At that time his carvedilol was decreased from 25 mg daily to 12.5 mg daily. Strongly encouraged Mr. Pasion to seek medical attention today during visit. He declined transport for evaluation of elevated blood  pressure. Encouraged him to contact his primary care provider today for medication management. He stated that he would call his primary care doctor after wound visit today. Also encouraged him that if he experienced any symptoms of blurred vision, chest pain, shortness of breath to immediately seek medical attention. 07/17/18 on evaluation today patient's wound actually does seem to show signs of improvement based on the overall appearance of the wound bed today. There does not appear to show any signs of infection which is good news. There is no overall worsening which is also good news. He still has hyper granular tissue will use in the [employer] followed by [facility] Dressing this point. 07/31/18 on evaluation today patient's wound bed actually show signs of improvement with good epithelialization especially in the upper portion of the wound. He has been tolerating the dressing changes without complication in general. Overall I'm extremely happy with how things stand. No fevers, chills, nausea, or vomiting noted at this time. 08/28/18 on evaluation today patient appears to be doing a little better in regard to his lower extremity ulcer. With that being said it appears to be very hyper granular I think this is directly attributed to the fact that he continues to use the [employer] underneath the [facility] Dressing. Although this helps not to stick unfortunately also think he's not getting the benefit of the [facility] Dressing particular and subsequently is causing too much moisture buildup hence the hyper granulation. Fortunately there does not appear to be any signs of infection at this time which is good news. 09/03/18 on evaluation today patient appears to be doing well in regard to his lower [name] ulcer. He has been tolerating the dressing changes without complication. Fortunately he has much less hyper granular tissue the noted last week I do think using silver nitrate in changing to  using the [facility] Dressing without the mepitel has made a big difference for him this is good news. 09/18/18 on evaluation today patient appears to be doing excellent in regard to his right lower [name] ulcer. This is actually significantly smaller even compared to the last evaluation. Overall I'm very pleased in this regard. I'm a recommend that we likely repeat the silver nitrate today based on what I'm seeing. 10/02/18 on evaluation today patient appears to be doing excellent in regard to his lower extremity wound on the right. He's been tolerating the dressing changes without complication. Fortunately there's no signs of infection at this time. Overall been very pleased with how things seem to be progressing. No fevers, chills, nausea, or vomiting noted at this time. 10/16/18 on evaluation today patient actually appears to be doing much  better in regard to his lower extremity wound on the right. This is shown signs of good improvement and is indeed measuring smaller he is on a excellent track as far as healing is concerned. My hope is this will be healed of the next several weeks. Fortunately there is no evidence of infection at this time. He does seem to be doing everything that I'm recommending for him 10/30/18 on evaluation today patient appears to be doing better in regard to his lower extremity ulcer. He's been tolerating the dressing changes without complication. Fortunately the Hydrofera Blue Dressing to be doing the job. He has made excellent progress. With that being said this is still going somewhat slow but nonetheless is always making good progress at this point. 5/18-Patient was in to be seen for right leg ulcer that appeared after scab above it was removed today. This area had healed completely. 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 The wound has not healed though it has been nearly 2 months since the injury occurred. He was seen in the emergency department at [facility] on May 9. At that time it was very painful and he described it as a "15 on a scale of 0oo10". He also was found to have 3+ pitting edema to the bilateral lower extremities. He was prescribed a weeks course of Keflex. He is here today because the wound has failed to heal and he has a prior history in our clinic. ABI in clinic today was noncompressible. He did have formal vascular studies performed in 2019 which were normal. The last hemoglobin A1c I have available for review was from March 21, 2021 and was elevated at 7.7%. The wound is fairly small and circular located about 3 inches above his right lateral malleolus. It is completely covered with eschar. No surrounding erythema, no odor, no purulent drainage. Patient History Information obtained from Patient. Allergies Shellfish Containing Products, codeine (Reaction: itching) Family History Cancer - Mother, Diabetes - Mother,Father, Hypertension - Mother,Father, Kidney Disease - Siblings, Stroke - Father, No family history of Heart Disease, Hereditary Spherocytosis, Lung Disease, Seizures, Thyroid Problems, Tuberculosis. Social History Former smoker - ended on 08/14/1990, Marital Status - Married, Alcohol Use - Moderate, Drug Use - No History, Caffeine Use - Daily. Medical History Eyes Denies history of Cataracts, Glaucoma, Optic Neuritis Ear/Nose/Mouth/Throat Denies history of Chronic sinus problems/congestion, Middle ear problems Hematologic/Lymphatic Denies history of Anemia, Hemophilia, Human Immunodeficiency Virus, Lymphedema, Sickle Cell Disease Respiratory Denies history of Aspiration, Asthma, Chronic Obstructive Pulmonary Disease (COPD), Pneumothorax, Sleep Apnea,  Tuberculosis Cardiovascular Patient has history of Hypertension Denies history of Angina, Arrhythmia, Congestive Heart Failure, Coronary Artery Disease, Deep Vein Thrombosis, Hypotension, Myocardial Infarction, Peripheral Arterial Disease, Peripheral Venous Disease, Phlebitis, Vasculitis Gastrointestinal Denies history of Cirrhosis , Colitis, Crohnoos, Hepatitis A, Hepatitis B, Hepatitis C Endocrine Patient has history of Type II Diabetes Genitourinary Denies history of End Stage Renal Disease Immunological Denies history of Lupus Erythematosus, Raynaudoos, Scleroderma Integumentary (Skin) Denies history of History of Burn Musculoskeletal Denies history of Gout, Rheumatoid Arthritis, Osteoarthritis, Osteomyelitis Neurologic Denies history of Dementia, Neuropathy, Quadriplegia, Paraplegia, Seizure Disorder Oncologic Denies history of Received Chemotherapy, Received Radiation Psychiatric Denies history of Anorexia/bulimia, Confinement Anxiety Hospitalization/Surgery History - esophagogastroduodenoscopy. -  brain aneurysm surgery. Medical A Surgical History Notes nd Constitutional Symptoms (General Health) obesity Gastrointestinal diverticulitis Neurologic neuropathy Review of Systems (ROS) Constitutional Symptoms (General Health) Denies complaints or symptoms of Fatigue, Fever, Chills, Marked Weight Change. Eyes Denies complaints or symptoms of Dry Eyes, Vision Changes, Glasses / Contacts. Ear/Nose/Mouth/Throat Denies complaints or symptoms of Chronic sinus problems or rhinitis. Respiratory Denies complaints or symptoms of Chronic or frequent coughs, Shortness of Breath. Genitourinary Denies complaints or symptoms of Frequent urination. Integumentary (Skin) Complains or has symptoms of Wounds - R lower leg. Musculoskeletal Denies complaints or symptoms of Muscle Pain, Muscle Weakness. Psychiatric Denies complaints or symptoms of Claustrophobia,  Suicidal. Objective Constitutional Mildly hypertensive. No acute distress. Vitals Time Taken: 1:40 PM, Height: 75 in, Source: Stated, Weight: 310 lbs, Source: Stated, BMI: 38.7, Temperature: 98.1 F, Pulse: 81 bpm, Respiratory Rate: 18 breaths/min, Blood Pressure: 153/93 mmHg. Respiratory Normal work of breathing on room air.. Cardiovascular 2+ pitting edema to the bilateral lower extremities.. General Notes: 01/02/2022: The wound is fairly small and circular located about 3 inches above his right lateral malleolus. It is completely covered with eschar. No surrounding erythema, no odor, no purulent drainage. Integumentary (Hair, Skin) Wound #2 status is Open. Original cause of wound was Trauma. The date acquired was: 12/03/2021. The wound is located on the Right,Lateral Lower Leg. The wound measures 1.5cm length x 0.9cm width x 0.1cm depth; 1.06cm^2 area and 0.106cm^3 volume. There is Fat Layer (Subcutaneous Tissue) exposed. There is no tunneling or undermining noted. There is a medium amount of serosanguineous drainage noted. The wound margin is distinct with the outline attached to the wound base. There is small (1-33%) red granulation within the wound bed. There is a large (67-100%) amount of necrotic tissue within the wound bed including Eschar. Assessment Active Problems ICD-10 Non-pressure chronic ulcer of other part of right lower leg with fat layer exposed Type 2 diabetes mellitus with other skin ulcer Type 2 diabetes mellitus without complications Essential (primary) hypertension Venous insufficiency (chronic) (peripheral) Procedures Wound #2 Pre-procedure diagnosis of Wound #2 is a Venous Leg Ulcer located on the Right,Lateral Lower Leg .Severity of Tissue Pre Debridement is: Fat layer exposed. There was a Selective/Open Wound Skin/Epidermis Debridement with a total area of 1.35 sq cm performed by Duanne Guess, MD. With the following instrument(s): Curette to remove Viable  and Non-Viable tissue/material. Material removed includes Eschar and Skin: Epidermis and after achieving pain control using Lidocaine 4% T opical Solution. No specimens were taken. A time out was conducted at 14:28, prior to the start of the procedure. A Minimum amount of bleeding was controlled with Pressure. The procedure was tolerated well with a pain level of 10 throughout and a pain level of 2 following the procedure. Post Debridement Measurements: 1.5cm length x 0.9cm width x 0.1cm depth; 0.106cm^3 volume. Character of Wound/Ulcer Post Debridement is improved. Severity of Tissue Post Debridement is: Fat layer exposed. Post procedure Diagnosis Wound #2: Same as Pre-Procedure Plan Follow-up Appointments: Return Appointment in 1 week. - Dr. Lady Gary - Room 2 - 5/31 at 12:45 Bathing/ Shower/ Hygiene: May shower with protection but do not get wound dressing(s) wet. - may purchase a cast protector from [facility] or [facility] Edema Control - Lymphedema / SCD / Other: Elevate legs to the level of the heart or above for 30 minutes daily and/or when sitting, a frequency of: Avoid standing for long periods of time. Patient to wear own compression stockings every day. Exercise regularly Moisturize legs daily. Compression stocking  or Garment 20-30 mm/Hg pressure to: - to L left until R leg is healed Consults ordered were: Vein and Vascular - for noncompressible ABI in clinic - need formal ABIs and TBIs WOUND #2: - Lower Leg Wound Laterality: Right, Lateral Cleanser: Soap and Water 1 x Per Week/30 Days Discharge Instructions: May shower and wash wound with dial antibacterial soap and water prior to dressing change. Cleanser: Wound Cleanser 1 x Per Week/30 Days Discharge Instructions: Cleanse the wound with wound cleanser prior to applying a clean dressing using gauze sponges, not tissue or cotton balls. Peri-Wound Care: Sween Lotion (Moisturizing lotion) 1 x Per Week/30 Days Discharge Instructions: Apply  moisturizing lotion as directed Prim Dressing: IODOFLEX 0.9% Cadexomer Iodine Pad 4x6 cm 1 x Per Week/30 Days ary Discharge Instructions: Apply to wound bed as instructed Secondary Dressing: Woven Gauze Sponge, Non-Sterile 4x4 in 1 x Per Week/30 Days Discharge Instructions: Apply over primary dressing as directed. Secured With: Coban Self-Adherent Wrap 4x5 (in/yd) 1 x Per Week/30 Days Discharge Instructions: Secure with Coban as directed. Secured With: [employer], 4.5x3.1 (in/yd) 1 x Per Week/30 Days Discharge Instructions: Secure with Kerlix as directed. Secured With: 72M Medipore H Soft Cloth Surgical T ape, 4 x 10 (in/yd) 1 x Per Week/30 Days Discharge Instructions: Secure with tape as directed. 01/02/2022: The wound is fairly small and circular located about 3 inches above his right lateral malleolus. It is completely covered with eschar. No surrounding erythema, no odor, no purulent drainage. I used a curette to debride the overlying eschar from the wound. It is fairly superficial but still in need of some chemical or enzymatic debridement. We will use Iodoflex and Kerlix/Coban wraps. Due to noncompressible ABIs, we will send him for formal studies. He did have normal arterial studies in 2019, but certainly things may have changed since that time. I have also recommended that he wear compression stockings on a regular basis with 20-30 mmHg compression. He said that he would order some from [place]. I recommended that he wear a unilateral right sided stocking until his left leg is healed. I will see him back in 1 week. Electronic Signature(s) Signed: 01/02/2022 2:56:43 PM By: Duanne Guess MD FACS Entered By: Duanne Guess on 01/02/2022 14:56:43 -------------------------------------------------------------------------------- HxROS Details Patient Name: Date of Service: Anthony Barnes, [name] 01/02/2022 1:30 PM Medical Record Number: [id number] Patient Account Number:  [id number] Date of Birth/Sex: Treating RN: 1955-10-14 (66 y.o. [name] Primary Care Provider: Hoy Register Other Clinician: Referring Provider: Treating Provider/Extender: Camillo Flaming Weeks in Treatment: 0 Information Obtained From Patient Constitutional Symptoms (General Health) Complaints and Symptoms: Negative for: Fatigue; Fever; Chills; Marked Weight Change Medical History: Past Medical History Notes: obesity Eyes Complaints and Symptoms: Negative for: Dry Eyes; Vision Changes; Glasses / Contacts Medical History: Negative for: Cataracts; Glaucoma; Optic Neuritis Ear/Nose/Mouth/Throat Complaints and Symptoms: Negative for: Chronic sinus problems or rhinitis Medical History: Negative for: Chronic sinus problems/congestion; Middle ear problems Respiratory Complaints and Symptoms: Negative for: Chronic or frequent coughs; Shortness of Breath Medical History: Negative for: Aspiration; Asthma; Chronic Obstructive Pulmonary Disease (COPD); Pneumothorax; Sleep Apnea; Tuberculosis Genitourinary Complaints and Symptoms: Negative for: Frequent urination Medical History: Negative for: End Stage Renal Disease Integumentary (Skin) Complaints and Symptoms: Positive for: Wounds - R lower leg Medical History: Negative for: History of Burn Musculoskeletal Complaints and Symptoms: Negative for: Muscle Pain; Muscle Weakness Medical History: Negative for: Gout; Rheumatoid Arthritis; Osteoarthritis; Osteomyelitis Psychiatric Complaints and Symptoms: Negative for: Claustrophobia; Suicidal Medical History:  Negative for: Anorexia/bulimia; Confinement Anxiety Hematologic/Lymphatic Medical History: Negative for: Anemia; Hemophilia; Human Immunodeficiency Virus; Lymphedema; Sickle Cell Disease Cardiovascular Medical History: Positive for: Hypertension Negative for: Angina; Arrhythmia; Congestive Heart Failure; Coronary Artery Disease; Deep Vein  Thrombosis; Hypotension; Myocardial Infarction; Peripheral Arterial Disease; Peripheral Venous Disease; Phlebitis; Vasculitis Gastrointestinal Medical History: Negative for: Cirrhosis ; Colitis; Crohns; Hepatitis A; Hepatitis B; Hepatitis C Past Medical History Notes: diverticulitis Endocrine Medical History: Positive for: Type II Diabetes Time with diabetes: 8 years Treated with: Oral agents Blood sugar tested every day: No Immunological Medical History: Negative for: Lupus Erythematosus; Raynauds; Scleroderma Neurologic Medical History: Negative for: Dementia; Neuropathy; Quadriplegia; Paraplegia; Seizure Disorder Past Medical History Notes: neuropathy Oncologic Medical History: Negative for: Received Chemotherapy; Received Radiation Immunizations Pneumococcal Vaccine: Received Pneumococcal Vaccination: No Immunization Notes: tetanus 2 years ago Implantable Devices None Hospitalization / Surgery History Type of Hospitalization/Surgery esophagogastroduodenoscopy brain aneurysm surgery Family and Social History Cancer: Yes - Mother; Diabetes: Yes - Mother,Father; Heart Disease: No; Hereditary Spherocytosis: No; Hypertension: Yes - Mother,Father; Kidney Disease: Yes - Siblings; Lung Disease: No; Seizures: No; Stroke: Yes - Father; Thyroid Problems: No; Tuberculosis: No; Former smoker - ended on 08/14/1990; Marital Status - Married; Alcohol Use: Moderate; Drug Use: No History; Caffeine Use: Daily; Financial Concerns: No; Food, Clothing or Shelter Needs: No; Support System Lacking: No; Transportation Concerns: No Psychologist, prison and probation services) Signed: 01/02/2022 2:58:20 PM By: Duanne Guess MD FACS Signed: 01/02/2022 5:28:22 PM By: Samuella Bruin Previous Signature: 01/02/2022 1:54:44 PM Version By: Duanne Guess MD FACS Entered By: Samuella Bruin on 01/02/2022 13:58:11 -------------------------------------------------------------------------------- SuperBill  Details Patient Name: Date of Service: Anthony Barnes, [name] 01/02/2022 Medical Record Number: [id number] Patient Account Number: [id number] Date of Birth/Sex: Treating RN: Mar 05, 1956 (66 y.o. 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.622 Type 2 diabetes mellitus with other skin ulcer E11.9 Type 2 diabetes mellitus without complications I10 Essential (primary) hypertension I87.2 Venous insufficiency (chronic) (peripheral) Facility Procedures CPT4 Code: [id number] Description: 99213 - WOUND CARE VISIT-LEV 3 EST PT Modifier: 25 Quantity: 1 CPT4 Code: [id number] Description: 97597 - DEBRIDE WOUND 1ST 20 SQ CM OR < ICD-10 Diagnosis Description 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os Modifier: ed Quantity: 1 Physician Procedures : CPT4 Code Description Modifier [id number] 99204 - WC PHYS LEVEL 4 - NEW PT 25 ICD-10 Diagnosis Description 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

## 2022-01-02 NOTE — Progress Notes (Signed)
SEVIN, PACHA ([id number]) Visit Report for 01/02/2022 Allergy List Details Patient Name: Date of Service: [name] 01/02/2022 1:30 PM Medical Record Number: [id number] Patient Account Number: [id number] Date of Birth/Sex: Treating RN: Jan 11, 1956 (66 y.o. [name] Primary Care Audryna Wendt: Charlott Rakes Other Clinician: Referring Durinda Buzzelli: Treating Walida Cajas/Extender: Myrtie Neither, Charlane Ferretti Weeks in Treatment: 0 Allergies Active Allergies Shellfish Containing Products codeine Reaction: itching Allergy Notes Electronic Signature(s) Signed: 01/02/2022 5:28:22 PM By: Adline Peals Entered By: Adline Peals on 01/02/2022 13:37:49 -------------------------------------------------------------------------------- Arrival Information Details Patient Name: Date of Service: [name], [name] 01/02/2022 1:30 PM Medical Record Number: [id number] Patient Account Number: [id number] Date of Birth/Sex: Treating RN: 1955-12-23 (66 y.o. [name] Primary Care Lynell Kussman: Charlott Rakes Other Clinician: Referring Royer Cristobal: Treating Berdina Cheever/Extender: Carter Kitten Weeks in Treatment: 0 Visit Information Patient Arrived: Ambulatory Arrival Time: 13:33 Accompanied By: self Transfer Assistance: None Patient Identification Verified: Yes Secondary Verification Process Completed: Yes Patient Requires Transmission-Based Precautions: No History Since Last Visit Electronic Signature(s) Signed: 01/02/2022 5:28:22 PM By: Adline Peals Entered By: Adline Peals on 01/02/2022 13:36:07 -------------------------------------------------------------------------------- Clinic Level of Care Assessment Details Patient Name: Date of Service: [name] 01/02/2022 1:30 PM Medical Record Number: [id number] Patient Account Number: [id number] Date of Birth/Sex: Treating RN: 04/03/1956 (66 y.o. [name] Primary Care Zaryah Seckel: Charlott Rakes Other Clinician: Referring Duane Trias: Treating Faron Whitelock/Extender: Carter Kitten Weeks in Treatment: 0 Clinic Level of Care Assessment Items TOOL 1 Quantity Score X- 1 0 Use when EandM and Procedure is performed on INITIAL visit ASSESSMENTS - Nursing Assessment / Reassessment X- 1 20 General Physical Exam (combine w/ comprehensive assessment (listed just below) when performed on new pt. evals) X- 1 25 Comprehensive Assessment (HX, ROS, Risk Assessments, Wounds Hx, etc.) ASSESSMENTS - Wound and Skin Assessment / Reassessment []  - 0 Dermatologic / Skin Assessment (not related to wound area) ASSESSMENTS - Ostomy and/or Continence Assessment and Care []  - 0 Incontinence Assessment and Management []  - 0 Ostomy Care Assessment and Management (repouching, etc.) PROCESS - Coordination of Care X - Simple Patient / Family Education for ongoing care 1 15 []  - 0 Complex (extensive) Patient / Family Education for ongoing care X- 1 10 Staff obtains Programmer, systems, Records, T Results / Process Orders est []  - 0 Staff telephones HHA, Nursing Homes / Clarify orders / etc []  - 0 Routine Transfer to another Facility (non-emergent condition) []  - 0 Routine Hospital Admission (non-emergent condition) X- 1 15 New Admissions / Biomedical engineer / Ordering NPWT Apligraf, etc. , []  - 0 Emergency Hospital Admission (emergent condition) PROCESS - Special Needs []  - 0 Pediatric / Minor Patient Management []  - 0 Isolation Patient Management []  - 0 Hearing / Language / Visual special needs []  - 0 Assessment of Community assistance (transportation, D/C planning, etc.) []  - 0 Additional assistance / Altered mentation []  - 0 Support Surface(s) Assessment (bed, cushion, seat, etc.) INTERVENTIONS - Miscellaneous []  - 0 External ear exam []  - 0 Patient Transfer (multiple staff / Civil Service fast streamer / Similar devices) []  - 0 Simple Staple  / Suture removal (25 or less) []  - 0 Complex Staple / Suture removal (26 or more) []  - 0 Hypo/Hyperglycemic Management (do not check if billed separately) X- 1 15 Ankle / Brachial Index (ABI) - do not check if billed separately Has the patient been seen at the hospital within the last three years: Yes Total Score: 100 Level Of Care: New/Established -  Level 3 Electronic Signature(s) Signed: 01/02/2022 5:28:22 PM By: Adline Peals Entered By: Adline Peals on 01/02/2022 14:56:25 -------------------------------------------------------------------------------- Encounter Discharge Information Details Patient Name: Date of Service: [name], [place]. 01/02/2022 1:30 PM Medical Record Number: [id number] Patient Account Number: [id number] Date of Birth/Sex: Treating RN: 10-21-1955 (66 y.o. [name] Primary Care Loyola Santino: Charlott Rakes Other Clinician: Referring Harshaan Whang: Treating Ireta Pullman/Extender: Carter Kitten Weeks in Treatment: 0 Encounter Discharge Information Items Post Procedure Vitals Discharge Condition: Stable Temperature (F): 98.1 Ambulatory Status: Ambulatory Pulse (bpm): 81 Discharge Destination: Home Respiratory Rate (breaths/min): 18 Transportation: Private Auto Blood Pressure (mmHg): 153/93 Accompanied By: self Schedule Follow-up Appointment: Yes Clinical Summary of Care: Patient Declined Electronic Signature(s) Signed: 01/02/2022 5:28:22 PM By: Adline Peals Entered By: Adline Peals on 01/02/2022 14:57:28 -------------------------------------------------------------------------------- Lower Extremity Assessment Details Patient Name: Date of Service: [name] 01/02/2022 1:30 PM Medical Record Number: [id number] Patient Account Number: [id number] Date of Birth/Sex: Treating RN: 09/07/1955 (66 y.o. [name] Primary Care Sallie Staron: Charlott Rakes Other Clinician: Referring  Rolla Kedzierski: Treating [place]/Extender: Carter Kitten Weeks in Treatment: 0 Edema Assessment Assessed: [Left: No] [Right: No] E[Left: dema] [Right: :] Calf Left: Right: Point of Measurement: From Medial Instep 46.6 cm Ankle Left: Right: Point of Measurement: From Medial Instep 29.4 cm Vascular Assessment Pulses: Dorsalis Pedis Palpable: [Right:Yes] Notes R ABI noncompressible Electronic Signature(s) Signed: 01/02/2022 5:28:22 PM By: Adline Peals Entered By: Adline Peals on 01/02/2022 14:18:36 -------------------------------------------------------------------------------- Multi Wound Chart Details Patient Name: Date of Service: [name], [name] 01/02/2022 1:30 PM Medical Record Number: [id number] Patient Account Number: [id number] Date of Birth/Sex: Treating RN: 1955/09/02 (66 y.o. [name] Primary Care Jlon Betker: Charlott Rakes Other Clinician: Referring Dennette Faulconer: Treating Candus Braud/Extender: Carter Kitten Weeks in Treatment: 0 Vital Signs Height(in): 75 Pulse(bpm): 81 Weight(lbs): 310 Blood Pressure(mmHg): 153/93 Body Mass Index(BMI): 38.7 Temperature(F): 98.1 Respiratory Rate(breaths/min): 18 Photos: [N/A:N/A] Right, Lateral Lower Leg N/A N/A Wound Location: Trauma N/A N/A Wounding Event: Venous Leg Ulcer N/A N/A Primary Etiology: Hypertension, Type II Diabetes N/A N/A Comorbid History: 12/03/2021 N/A N/A Date Acquired: 0 N/A N/A Weeks of Treatment: Open N/A N/A Wound Status: No N/A N/A Wound Recurrence: 1.5x0.9x0.1 N/A N/A Measurements L x W x D (cm) 1.06 N/A N/A A (cm) : rea 0.106 N/A N/A Volume (cm) : 0.00% N/A N/A % Reduction in A rea: 0.00% N/A N/A % Reduction in Volume: Full Thickness Without Exposed N/A N/A Classification: Support Structures Medium N/A N/A Exudate A mount: Serosanguineous N/A N/A Exudate Type: red, brown N/A N/A Exudate Color: Distinct, outline  attached N/A N/A Wound Margin: Small (1-33%) N/A N/A Granulation A mount: Red N/A N/A Granulation Quality: Large (67-100%) N/A N/A Necrotic A mount: Eschar N/A N/A Necrotic Tissue: Fat Layer (Subcutaneous Tissue): Yes N/A N/A Exposed Structures: Fascia: No Tendon: No Muscle: No Joint: No Bone: No None N/A N/A Epithelialization: Debridement - Selective/Open Wound N/A N/A Debridement: Pre-procedure Verification/Time Out 14:28 N/A N/A Taken: Lidocaine 4% Topical Solution N/A N/A Pain Control: Necrotic/Eschar N/A N/A Tissue Debrided: Skin/Epidermis N/A N/A Level: 1.35 N/A N/A Debridement A (sq cm): rea Curette N/A N/A Instrument: Minimum N/A N/A Bleeding: Pressure N/A N/A Hemostasis A chieved: 10 N/A N/A Procedural Pain: 2 N/A N/A Post Procedural Pain: Procedure was tolerated well N/A N/A Debridement Treatment Response: 1.5x0.9x0.1 N/A N/A Post Debridement Measurements L x W x D (cm) 0.106 N/A N/A Post Debridement Volume: (cm) Debridement N/A N/A Procedures Performed: Treatment Notes Electronic Signature(s)  Signed: 01/02/2022 2:46:13 PM By: Fredirick Maudlin MD FACS Signed: 01/02/2022 5:28:22 PM By: Sabas Sous By: Fredirick Maudlin on 01/02/2022 14:46:12 -------------------------------------------------------------------------------- Multi-Disciplinary Care Plan Details Patient Name: Date of Service: [name], [name] 01/02/2022 1:30 PM Medical Record Number: [id number] Patient Account Number: [id number] Date of Birth/Sex: Treating RN: 1955/10/24 (66 y.o. [name] Primary Care Delayni Streed: Charlott Rakes Other Clinician: Referring Naama Sappington: Treating Cigi Bega/Extender: Carter Kitten Weeks in Treatment: 0 Active Inactive Venous Leg Ulcer Nursing Diagnoses: Actual venous Insuffiency (use after diagnosis is confirmed) Knowledge deficit related to disease process and management Goals: Patient will maintain  optimal edema control Date Initiated: 01/02/2022 Target Resolution Date: 02/03/2022 Goal Status: Active Patient/caregiver will verbalize understanding of disease process and disease management Date Initiated: 01/02/2022 Target Resolution Date: 02/03/2022 Goal Status: Active Interventions: Assess peripheral edema status every visit. Compression as ordered Provide education on venous insufficiency Treatment Activities: Non-invasive vascular studies : 01/02/2022 T ordered outside of clinic : 01/02/2022 est Therapeutic compression applied : 01/02/2022 Notes: Wound/Skin Impairment Nursing Diagnoses: Impaired tissue integrity Knowledge deficit related to ulceration/compromised skin integrity Goals: Patient/caregiver will verbalize understanding of skin care regimen Date Initiated: 01/02/2022 Target Resolution Date: 02/03/2022 Goal Status: Active Ulcer/skin breakdown will have a volume reduction of 30% by week 4 Date Initiated: 01/02/2022 Target Resolution Date: 02/03/2022 Goal Status: Active Interventions: Assess ulceration(s) every visit Provide education on ulcer and skin care Treatment Activities: Skin care regimen initiated : 01/02/2022 Topical wound management initiated : 01/02/2022 Notes: Electronic Signature(s) Signed: 01/02/2022 5:28:22 PM By: Adline Peals Entered By: Adline Peals on 01/02/2022 14:55:58 -------------------------------------------------------------------------------- Pain Assessment Details Patient Name: Date of Service: [name], [name] 01/02/2022 1:30 PM Medical Record Number: [id number] Patient Account Number: [id number] Date of Birth/Sex: Treating RN: [birth year] (66 y.o. [name] Primary Care Add Dinapoli: Charlott Rakes Other Clinician: Referring Jozlynn Plaia: Treating Briannia Laba/Extender: Carter Kitten Weeks in Treatment: 0 Active Problems Location of Pain Severity and Description of Pain Patient Has Paino  No Site Locations Rate the pain. Current Pain Level: 0 Pain Management and Medication Current Pain Management: Electronic Signature(s) Signed: 01/02/2022 5:28:22 PM By: Adline Peals Entered By: Adline Peals on 01/02/2022 14:18:44 -------------------------------------------------------------------------------- Patient/Caregiver Education Details Patient Name: Date of Service: [name], [name] Lamar Blinks 5/22/2023andnbsp1:30 PM Medical Record Number: [id number] Patient Account Number: [id number] Date of Birth/Gender: Treating RN: June 03, 1956 (66 y.o. [name] Primary Care Physician: Charlott Rakes Other Clinician: Referring Physician: Treating Physician/Extender: Carter Kitten Weeks in Treatment: 0 Education Assessment Education Provided To: Patient Education Topics Provided Venous: Handouts: Managing Venous Disease and Related Ulcers Methods: Explain/Verbal, Printed Responses: Reinforcements needed, State content correctly [place]: o Handouts: Welcome T [facility] [place] o Methods: Explain/Verbal, Printed Responses: Reinforcements needed, State content correctly Wound/Skin Impairment: Handouts: Caring for Your Ulcer Methods: Explain/Verbal, Printed Responses: Reinforcements needed, State content correctly Electronic Signature(s) Signed: 01/02/2022 5:28:22 PM By: Adline Peals Entered By: Adline Peals on 01/02/2022 14:19:48 -------------------------------------------------------------------------------- Wound Assessment Details Patient Name: Date of Service: [name], [place]. 01/02/2022 1:30 PM Medical Record Number: [id number] Patient Account Number: [id number] Date of Birth/Sex: Treating RN: 08-26-1955 (66 y.o. [name] Primary Care Evalene Vath: Charlott Rakes Other Clinician: Referring Angelus Hoopes: Treating Christena Sunderlin/Extender: Carter Kitten Weeks in  Treatment: 0 Wound Status Wound Number: 2 Primary Etiology: Venous Leg Ulcer Wound Location: Right, Lateral Lower Leg Wound Status: Open Wounding Event: Trauma Comorbid History: Hypertension, Type II Diabetes Date Acquired: 12/03/2021 [place]  Of Treatment: 0 Clustered Wound: No Photos Wound Measurements Length: (cm) 1.5 Width: (cm) 0.9 Depth: (cm) 0.1 Area: (cm) 1.06 Volume: (cm) 0.106 Wound Description Classification: Full Thickness Without Exposed Support Structures Wound Margin: Distinct, outline attached Exudate Amount: Medium Exudate Type: Serosanguineous Exudate Color: red, brown Foul Odor After Cleansing: Slough/Fibrino % Reduction in Area: 0% % Reduction in Volume: 0% Epithelialization: None Tunneling: No Undermining: No No No Wound Bed Granulation Amount: Small (1-33%) Exposed Structure Granulation Quality: Red Fascia Exposed: No Necrotic Amount: Large (67-100%) Fat Layer (Subcutaneous Tissue) Exposed: Yes Necrotic Quality: Eschar Tendon Exposed: No Muscle Exposed: No Joint Exposed: No Bone Exposed: No Treatment Notes Wound #2 (Lower Leg) Wound Laterality: Right, Lateral Cleanser Soap and Water Discharge Instruction: May shower and wash wound with dial antibacterial soap and water prior to dressing change. Wound Cleanser Discharge Instruction: Cleanse the wound with wound cleanser prior to applying a clean dressing using gauze sponges, not tissue or cotton balls. Peri-Wound Care Sween Lotion (Moisturizing lotion) Discharge Instruction: Apply moisturizing lotion as directed Topical Primary Dressing IODOFLEX 0.9% Cadexomer Iodine Pad 4x6 cm Discharge Instruction: Apply to wound bed as instructed Secondary Dressing Woven Gauze Sponge, Non-Sterile 4x4 in Discharge Instruction: Apply over primary dressing as directed. Secured With [employer] 4x5 (in/yd) Discharge Instruction: Secure with Coban as directed. Kerlix Roll Sterile, 4.5x3.1  (in/yd) Discharge Instruction: Secure with Kerlix as directed. 68M Medipore H Soft Cloth Surgical T ape, 4 x 10 (in/yd) Discharge Instruction: Secure with tape as directed. Compression Wrap Compression Stockings Add-Ons Electronic Signature(s) Signed: 01/02/2022 5:28:22 PM By: Adline Peals Entered By: Adline Peals on 01/02/2022 14:16:06 -------------------------------------------------------------------------------- Vitals Details Patient Name: Date of Service: [name], [name] 01/02/2022 1:30 PM Medical Record Number: [id number] Patient Account Number: [id number] Date of Birth/Sex: Treating RN: [birth year] (66 y.o. [name] Primary Care Lamoyne Palencia: Charlott Rakes Other Clinician: Referring Stana Bayon: Treating Jeniya Flannigan/Extender: Carter Kitten Weeks in Treatment: 0 Vital Signs Time Taken: 13:40 Temperature (F): 98.1 Height (in): 75 Pulse (bpm): 81 Source: Stated Respiratory Rate (breaths/min): 18 Weight (lbs): 310 Blood Pressure (mmHg): 153/93 Source: Stated Reference Range: 80 - 120 mg / dl Body Mass Index (BMI): 38.7 Electronic Signature(s) Signed: 01/02/2022 5:28:22 PM By: Adline Peals Entered By: Adline Peals on 01/02/2022 13:41:20

## 2022-01-03 ENCOUNTER — Other Ambulatory Visit (HOSPITAL_BASED_OUTPATIENT_CLINIC_OR_DEPARTMENT_OTHER): Payer: Self-pay | Admitting: General Surgery

## 2022-01-03 DIAGNOSIS — I739 Peripheral vascular disease, unspecified: Secondary | ICD-10-CM

## 2022-01-04 ENCOUNTER — Ambulatory Visit: Payer: Medicare Other | Admitting: Family Medicine

## 2022-01-11 ENCOUNTER — Encounter (HOSPITAL_BASED_OUTPATIENT_CLINIC_OR_DEPARTMENT_OTHER): Payer: Self-pay

## 2022-01-11 ENCOUNTER — Other Ambulatory Visit (HOSPITAL_BASED_OUTPATIENT_CLINIC_OR_DEPARTMENT_OTHER): Payer: Self-pay | Admitting: General Surgery

## 2022-01-11 ENCOUNTER — Encounter (HOSPITAL_BASED_OUTPATIENT_CLINIC_OR_DEPARTMENT_OTHER): Payer: Medicare Other

## 2022-01-11 ENCOUNTER — Ambulatory Visit: Payer: Self-pay | Admitting: *Deleted

## 2022-01-11 ENCOUNTER — Encounter (HOSPITAL_BASED_OUTPATIENT_CLINIC_OR_DEPARTMENT_OTHER): Payer: Medicare Other | Admitting: General Surgery

## 2022-01-11 DIAGNOSIS — I10 Essential (primary) hypertension: Secondary | ICD-10-CM | POA: Diagnosis not present

## 2022-01-11 DIAGNOSIS — I739 Peripheral vascular disease, unspecified: Secondary | ICD-10-CM

## 2022-01-11 DIAGNOSIS — R600 Localized edema: Secondary | ICD-10-CM | POA: Diagnosis not present

## 2022-01-11 DIAGNOSIS — E11622 Type 2 diabetes mellitus with other skin ulcer: Secondary | ICD-10-CM | POA: Diagnosis not present

## 2022-01-11 DIAGNOSIS — I872 Venous insufficiency (chronic) (peripheral): Secondary | ICD-10-CM | POA: Diagnosis not present

## 2022-01-11 DIAGNOSIS — L97812 Non-pressure chronic ulcer of other part of right lower leg with fat layer exposed: Secondary | ICD-10-CM | POA: Diagnosis not present

## 2022-01-11 NOTE — Telephone Encounter (Signed)
Routing to PCP for review.

## 2022-01-11 NOTE — Telephone Encounter (Signed)
Pt returned call.Requesting pain med to take prior to his debridement appts. States clinic advised to call PCP. Pt states "Just need something to take before they do that scraping, it's awful." HAs procedure weekly, next 01/18/22. Please advise Reason for Disposition  [1] Caller has NON-URGENT medicine question about med that PCP prescribed AND [2] triager unable to answer question  Answer Assessment - Initial Assessment Questions 1. NAME of MEDICATION: "What medicine are you calling about?"     Pain med 2. QUESTION: "What is your question?" (e.g., double dose of medicine, side effect)     Pain med prior to procedure.  Protocols used: Medication Question Call-A-AH

## 2022-01-11 NOTE — Progress Notes (Signed)
[name], Jekhi E. ([id number]) Visit Report for 01/11/2022 Arrival Information Details Patient Name: Date of Service: Myrene BuddyGLA DNEY, [name] 01/11/2022 12:45 PM Medical Record Number: [id number] Patient Account Number: [id number] Date of Birth/Sex: Treating RN: 03-Mar-1956 (66 y.o. [name]) Herrington, Taylor Primary Care Nekeshia Lenhardt: Hoy RegisterNewlin, Enobong Other Clinician: Referring Keanthony Poole: Treating Sarayah Bacchi/Extender: Camillo Flamingannon, Jennifer Newlin, Enobong Weeks in Treatment: 1 Visit Information History Since Last Visit Added or deleted any medications: No Patient Arrived: Ambulatory Any new allergies or adverse reactions: No Arrival Time: 12:50 Had a fall or experienced change in No Accompanied By: self activities of daily living that may affect Transfer Assistance: None risk of falls: Patient Identification Verified: Yes Signs or symptoms of abuse/neglect since last visito No Secondary Verification Process Completed: Yes Hospitalized since last visit: No Patient Requires Transmission-Based Precautions: No Implantable device outside of the clinic excluding No cellular tissue based products placed in the center since last visit: Has Dressing in Place as Prescribed: Yes Has Compression in Place as Prescribed: Yes Pain Present Now: Yes Electronic Signature(s) Signed: 01/11/2022 5:12:22 PM By: Samuella BruinHerrington, Taylor Entered By: Samuella BruinHerrington, Taylor on 01/11/2022 12:53:36 -------------------------------------------------------------------------------- Encounter Discharge Information Details Patient Name: Date of Service: Danne HarborGLA DNEY, [name] 01/11/2022 12:45 PM Medical Record Number: [id number] Patient Account Number: [id number] Date of Birth/Sex: Treating RN: 03-Mar-1956 (66 y.o. [name]) Herrington, Taylor Primary Care Mya Suell: Hoy RegisterNewlin, Enobong Other Clinician: Referring Siren Porrata: Treating Cristina Mattern/Extender: Camillo Flamingannon, Jennifer Newlin, Enobong Weeks in Treatment: 1 Encounter Discharge Information Items Post  Procedure Vitals Discharge Condition: Stable Temperature (F): 97.8 Ambulatory Status: Ambulatory Pulse (bpm): 76 Discharge Destination: Home Respiratory Rate (breaths/min): 18 Transportation: Private Auto Blood Pressure (mmHg): 163/91 Accompanied By: self Schedule Follow-up Appointment: Yes Clinical Summary of Care: Patient Declined Electronic Signature(s) Signed: 01/11/2022 5:12:22 PM By: Samuella BruinHerrington, Taylor Entered By: Samuella BruinHerrington, Taylor on 01/11/2022 14:07:09 -------------------------------------------------------------------------------- Lower Extremity Assessment Details Patient Name: Date of Service: [name], [name] 01/11/2022 12:45 PM Medical Record Number: [id number] Patient Account Number: [id number] Date of Birth/Sex: Treating RN: 03-Mar-1956 (66 y.o. [name]) Herrington, Taylor Primary Care Kiriana Worthington: Hoy RegisterNewlin, Enobong Other Clinician: Referring Eilee Schader: Treating Kahlyn Shippey/Extender: Camillo Flamingannon, Jennifer Newlin, Enobong Weeks in Treatment: 1 Edema Assessment Assessed: [Left: No] [Right: No] E[Left: dema] [Right: :] Calf Left: Right: Point of Measurement: From Medial Instep 48.3 cm Ankle Left: Right: Point of Measurement: From Medial Instep 27.4 cm Vascular Assessment Pulses: Dorsalis Pedis Palpable: [Right:Yes] Electronic Signature(s) Signed: 01/11/2022 5:12:22 PM By: Samuella BruinHerrington, Taylor Entered By: Samuella BruinHerrington, Taylor on 01/11/2022 12:57:34 -------------------------------------------------------------------------------- Multi Wound Chart Details Patient Name: Date of Service: Danne HarborGLA DNEY, [name] 01/11/2022 12:45 PM Medical Record Number: [id number] Patient Account Number: [id number] Date of Birth/Sex: Treating RN: 03-Mar-1956 (66 y.o. [name]) Herrington, Taylor Primary Care Anner Baity: Hoy RegisterNewlin, Enobong Other Clinician: Referring Aleathia Purdy: Treating Enola Siebers/Extender: Camillo Flamingannon, Jennifer Newlin, Enobong Weeks in Treatment: 1 Vital Signs Height(in): 75 Pulse(bpm):  76 Weight(lbs): 310 Blood Pressure(mmHg): 163/91 Body Mass Index(BMI): 38.7 Temperature(F): 97.8 Respiratory Rate(breaths/min): 18 Photos: [N/A:N/A] Right, Lateral Lower Leg N/A N/A Wound Location: Trauma N/A N/A Wounding Event: Venous Leg Ulcer N/A N/A Primary Etiology: Hypertension, Type II Diabetes N/A N/A Comorbid History: 12/03/2021 N/A N/A Date Acquired: 1 N/A N/A Weeks of Treatment: Open N/A N/A Wound Status: No N/A N/A Wound Recurrence: 1.3x1.1x0.1 N/A N/A Measurements L x W x D (cm) 1.123 N/A N/A A (cm) : rea 0.112 N/A N/A Volume (cm) : -5.90% N/A N/A % Reduction in A rea: -5.70% N/A N/A % Reduction in Volume: Full Thickness Without Exposed N/A N/A Classification: Support Structures Medium N/A  N/A Exudate A mount: Serosanguineous N/A N/A Exudate Type: red, brown N/A N/A Exudate Color: Distinct, outline attached N/A N/A Wound Margin: Medium (34-66%) N/A N/A Granulation A mount: Red N/A N/A Granulation Quality: Medium (34-66%) N/A N/A Necrotic A mount: Fat Layer (Subcutaneous Tissue): Yes N/A N/A Exposed Structures: Fascia: No Tendon: No Muscle: No Joint: No Bone: No Small (1-33%) N/A N/A Epithelialization: Debridement - Excisional N/A N/A Debridement: Pre-procedure Verification/Time Out 13:18 N/A N/A Taken: Lidocaine 4% Topical Solution N/A N/A Pain Control: Subcutaneous, Slough N/A N/A Tissue Debrided: Skin/Subcutaneous Tissue N/A N/A Level: 1.43 N/A N/A Debridement A (sq cm): rea Curette N/A N/A Instrument: Minimum N/A N/A Bleeding: Pressure N/A N/A Hemostasis A chieved: 8 N/A N/A Procedural Pain: 2 N/A N/A Post Procedural Pain: Procedure was tolerated well N/A N/A Debridement Treatment Response: 1.3x1.1x0.1 N/A N/A Post Debridement Measurements L x W x D (cm) 0.112 N/A N/A Post Debridement Volume: (cm) Debridement N/A N/A Procedures Performed: Treatment Notes Electronic Signature(s) Signed: 01/11/2022 1:38:54  PM By: Duanne Guess MD FACS Signed: 01/11/2022 5:12:22 PM By: Samuella Bruin Entered By: Duanne Guess on 01/11/2022 13:38:54 -------------------------------------------------------------------------------- Multi-Disciplinary Care Plan Details Patient Name: Date of Service: [name], [name] 01/11/2022 12:45 PM Medical Record Number: [id number] Patient Account Number: [id number] Date of Birth/Sex: Treating RN: August 29, 1955 (66 y.o. [name] Primary Care Axel Frisk: Hoy Register Other Clinician: Referring Detavious Rinn: Treating Jareli Highland/Extender: Camillo Flaming Weeks in Treatment: 1 Active Inactive Venous Leg Ulcer Nursing Diagnoses: Actual venous Insuffiency (use after diagnosis is confirmed) Knowledge deficit related to disease process and management Goals: Patient will maintain optimal edema control Date Initiated: 01/02/2022 Target Resolution Date: 02/03/2022 Goal Status: Active Patient/caregiver will verbalize understanding of disease process and disease management Date Initiated: 01/02/2022 Target Resolution Date: 02/03/2022 Goal Status: Active Interventions: Assess peripheral edema status every visit. Compression as ordered Provide education on venous insufficiency Treatment Activities: Non-invasive vascular studies : 01/02/2022 T ordered outside of clinic : 01/02/2022 est Therapeutic compression applied : 01/02/2022 Notes: Wound/Skin Impairment Nursing Diagnoses: Impaired tissue integrity Knowledge deficit related to ulceration/compromised skin integrity Goals: Patient/caregiver will verbalize understanding of skin care regimen Date Initiated: 01/02/2022 Target Resolution Date: 02/03/2022 Goal Status: Active Ulcer/skin breakdown will have a volume reduction of 30% by week 4 Date Initiated: 01/02/2022 Target Resolution Date: 02/03/2022 Goal Status: Active Interventions: Assess ulceration(s) every visit Provide education on ulcer  and skin care Treatment Activities: Skin care regimen initiated : 01/02/2022 Topical wound management initiated : 01/02/2022 Notes: Electronic Signature(s) Signed: 01/11/2022 5:12:22 PM By: Samuella Bruin Entered By: Samuella Bruin on 01/11/2022 12:58:38 -------------------------------------------------------------------------------- Pain Assessment Details Patient Name: Date of Service: [name], [name] 01/11/2022 12:45 PM Medical Record Number: [id number] Patient Account Number: [id number] Date of Birth/Sex: Treating RN: Jul 04, 1956 (66 y.o. [name] Primary Care Brexley Cutshaw: Hoy Register Other Clinician: Referring Dorothia Passmore: Treating Darriel Utter/Extender: Camillo Flaming Weeks in Treatment: 1 Active Problems Location of Pain Severity and Description of Pain Patient Has Paino Yes Site Locations Pain Location: Pain Location: Pain in Ulcers Duration of the Pain. Constant / Intermittento Intermittent Rate the pain. Current Pain Level: 5 Character of Pain Describe the Pain: Sharp Pain Management and Medication Current Pain Management: How does your wound impact your activities of daily livingo Sleep: Yes Electronic Signature(s) Signed: 01/11/2022 5:12:22 PM By: Samuella Bruin Entered By: Samuella Bruin on 01/11/2022 12:54:47 -------------------------------------------------------------------------------- Patient/Caregiver Education Details Patient Name: Date of Service: [name], [name] 5/31/2023andnbsp12:45 PM Medical Record Number: [id number] Patient Account Number:  [id number] Date of Birth/Gender: Treating RN: 27-Jul-1956 (66 y.o. [name] Primary Care Physician: Hoy Register Other Clinician: Referring Physician: Treating Physician/Extender: Camillo Flaming Weeks in Treatment: 1 Education Assessment Education Provided To: Patient Education Topics Provided Wound/Skin  Impairment: Methods: Explain/Verbal Responses: Reinforcements needed, State content correctly Electronic Signature(s) Signed: 01/11/2022 5:12:22 PM By: Samuella Bruin Entered By: Samuella Bruin on 01/11/2022 12:58:51 -------------------------------------------------------------------------------- Wound Assessment Details Patient Name: Date of Service: [name] 01/11/2022 12:45 PM Medical Record Number: [id number] Patient Account Number: [id number] Date of Birth/Sex: Treating RN: 04-Feb-1956 (66 y.o. [name] Primary Care Aylen Rambert: Hoy Register Other Clinician: Referring Yashika Mask: Treating Karlisa Gaubert/Extender: Camillo Flaming Weeks in Treatment: 1 Wound Status Wound Number: 2 Primary Etiology: Venous Leg Ulcer Wound Location: Right, Lateral Lower Leg Wound Status: Open Wounding Event: Trauma Comorbid History: Hypertension, Type II Diabetes Date Acquired: 12/03/2021 Weeks Of Treatment: 1 Clustered Wound: No Photos Wound Measurements Length: (cm) 1.3 Width: (cm) 1.1 Depth: (cm) 0.1 Area: (cm) 1.123 Volume: (cm) 0.112 % Reduction in Area: -5.9% % Reduction in Volume: -5.7% Epithelialization: Small (1-33%) Tunneling: No Undermining: No Wound Description Classification: Full Thickness Without Exposed Support Structures Wound Margin: Distinct, outline attached Exudate Amount: Medium Exudate Type: Serosanguineous Exudate Color: red, brown Foul Odor After Cleansing: No Slough/Fibrino Yes Wound Bed Granulation Amount: Medium (34-66%) Exposed Structure Granulation Quality: Red Fascia Exposed: No Necrotic Amount: Medium (34-66%) Fat Layer (Subcutaneous Tissue) Exposed: Yes Necrotic Quality: Adherent Slough Tendon Exposed: No Muscle Exposed: No Joint Exposed: No Bone Exposed: No Treatment Notes Wound #2 (Lower Leg) Wound Laterality: Right, Lateral Cleanser Soap and Water Discharge Instruction: May shower and wash  wound with dial antibacterial soap and water prior to dressing change. Wound Cleanser Discharge Instruction: Cleanse the wound with wound cleanser prior to applying a clean dressing using gauze sponges, not tissue or cotton balls. Peri-Wound Care Sween Lotion (Moisturizing lotion) Discharge Instruction: Apply moisturizing lotion as directed Topical Primary Dressing IODOFLEX 0.9% Cadexomer Iodine Pad 4x6 cm Discharge Instruction: Apply to wound bed as instructed Secondary Dressing Woven Gauze Sponge, Non-Sterile 4x4 in Discharge Instruction: Apply over primary dressing as directed. Secured With [employer] 4x5 (in/yd) Discharge Instruction: Secure with Coban as directed. Kerlix Roll Sterile, 4.5x3.1 (in/yd) Discharge Instruction: Secure with Kerlix as directed. 58M Medipore H Soft Cloth Surgical T ape, 4 x 10 (in/yd) Discharge Instruction: Secure with tape as directed. Compression Wrap Compression Stockings Add-Ons Electronic Signature(s) Signed: 01/11/2022 5:12:22 PM By: Samuella Bruin Entered By: Samuella Bruin on 01/11/2022 13:00:07 -------------------------------------------------------------------------------- Vitals Details Patient Name: Date of Service: [name], [name] 01/11/2022 12:45 PM Medical Record Number: [id number] Patient Account Number: [id number] Date of Birth/Sex: Treating RN: [birth year] (66 y.o. [name] Primary Care Son Barkan: Hoy Register Other Clinician: Referring Antwon Rochin: Treating Inayah Woodin/Extender: Camillo Flaming Weeks in Treatment: 1 Vital Signs Time Taken: 12:53 Temperature (F): 97.8 Height (in): 75 Pulse (bpm): 76 Weight (lbs): 310 Respiratory Rate (breaths/min): 18 Body Mass Index (BMI): 38.7 Blood Pressure (mmHg): 163/91 Reference Range: 80 - 120 mg / dl Electronic Signature(s) Signed: 01/11/2022 5:12:22 PM By: Samuella Bruin Entered By: Samuella Bruin on 01/11/2022  12:53:58

## 2022-01-11 NOTE — Progress Notes (Signed)
Anthony, Barnes ([id number]) Visit Report for 01/11/2022 Chief Complaint Document Details Patient Name: Date of Service: Anthony Barnes 01/11/2022 12:45 PM Medical Record Number: [id number] Patient Account Number: [id number] Date of Birth/Sex: Treating RN: April 20, 1956 (66 y.o. Anthony Barnes Primary Care Provider: Charlott Barnes Other Clinician: Referring Provider: Treating Provider/Extender: Anthony Barnes in Treatment: 1 Information Obtained from: Patient Chief Complaint Right LE Ulcer Electronic Signature(s) Signed: 01/11/2022 1:38:59 PM By: Anthony Maudlin MD FACS Entered By: Anthony Barnes on 01/11/2022 13:38:59 -------------------------------------------------------------------------------- Debridement Details Patient Name: Date of Service: Anthony Barnes, Anthony [name] 01/11/2022 12:45 PM Medical Record Number: [id number] Patient Account Number: [id number] Date of Birth/Sex: Treating RN: 07-20-1956 (66 y.o. Anthony Barnes Primary Care Provider: Charlott Barnes Other Clinician: Referring Provider: Treating Provider/Extender: Anthony Barnes in Treatment: 1 Debridement Performed for Assessment: Wound #2 Right,Lateral Lower Leg Performed By: Physician Anthony Maudlin, MD Debridement Type: Debridement Severity of Tissue Pre Debridement: Fat layer exposed Level of Consciousness (Pre-procedure): Awake and Alert Pre-procedure Verification/Time Out Yes - 13:18 Taken: Start Time: 13:18 Pain Control: Lidocaine 4% T opical Solution T Area Debrided (L x W): otal 1.3 (cm) x 1.1 (cm) = 1.43 (cm) Tissue and other material debrided: Viable, Non-Viable, Slough, Subcutaneous, Slough Level: Skin/Subcutaneous Tissue Debridement Description: Excisional Instrument: Curette Bleeding: Minimum Hemostasis Achieved: Pressure Procedural Pain: 8 Post Procedural Pain: 2 Response to Treatment: Procedure was tolerated well Level of  Consciousness (Post- Awake and Alert procedure): Post Debridement Measurements of Total Wound Length: (cm) 1.3 Width: (cm) 1.1 Depth: (cm) 0.1 Volume: (cm) 0.112 Character of Wound/Ulcer Post Debridement: Improved Severity of Tissue Post Debridement: Fat layer exposed Post Procedure Diagnosis Same as Pre-procedure Electronic Signature(s) Signed: 01/11/2022 5:02:34 PM By: Anthony Maudlin MD FACS Signed: 01/11/2022 5:12:22 PM By: Anthony Barnes Entered By: Anthony Barnes on 01/11/2022 13:22:22 -------------------------------------------------------------------------------- HPI Details Patient Name: Date of Service: Anthony Barnes, Anthony [name] 01/11/2022 12:45 PM Medical Record Number: [id number] Patient Account Number: [id number] Date of Birth/Sex: Treating RN: 04-Sep-1955 (66 y.o. Anthony Barnes Primary Care Provider: Charlott Barnes Other Clinician: Referring Provider: Treating Provider/Extender: Anthony Barnes in Treatment: 1 History of Present Illness HPI Description: ADMISSION 03/12/18 This is a 66 year old and it was a type II diabetic reasonably poorly controlled with a recent hemoglobin A1c of 8.2. He was tending to his lawn on 01/15/18 when his weed Anthony Barnes sent a rock up word hitting him in the right anterior leg. He was seen in his primary M.D. office is same time. An x-ray showed no abnormality. He was given a course of cephalexin. Since then he's been applying peroxide and topical antibiotics to the wound. He does not have a history of chronic wounds however he does have chronic edema in the lower legs. He is complaining of pain in the right leg wound but no real history of claudication. Past medical history includes type 2 diabetes, hypertension, morbid obesity. ABIs in the right leg were noncompressible 03/19/18 on evaluation today patient appears to be tolerating the wrap in general fairly well. He states he's not having that much pain in  regard to the wound itself. With that being said he is having some issues with slough buildup on the surface of the wound the Iodoflex does seem to be helpful in that regard. He is still having discomfort he wonders if I can send in a prescription for ibuprofen or something of like to help him out. I do believe that the prox  end may be of benefit for him. 03/25/18 on evaluation today patient appears to be doing very well in regard to his right lateral lower Anthony Barnes ulcer. He's been tolerating the dressing changes without complication that is the wraps. Nonetheless he does continue to have pain he states is been dreading the possibility of having to have debridement again today. His first debridement experience was not optimal. With that being said he does seem to be showing signs of improvement there does appear to be little bit more granulation he does have a lot of slough that really does need to breeding away. 04/04/18;the patient went for arterial studies that were really quite normal. ABI on the right at 1.19 with triphasic waveforms on the left 1.11 with triphasic waveforms. TBI 0.93 on the right and 0.95 on the left. This suggests T should be able to tolerate even 4 layer compression. He is however complaining of a lot of pain with our wraps I'm wondering whether the pain is simply Iodoflex. I changed him to [employer]. I'm still going to try to keep him in 3 layer compression 04/12/18 on evaluation today patient actually appears to be doing rather well in regard to his right lower Anthony ulcer. He is making good progress although it is slow still I do believe that the Santyl is much better than the Iodoflex. The good news is the patient seems to be tolerating the dressing changes without complication now that we've gotten good of the Iodoflex which really did burn him quite significantly. Otherwise there's no evidence of infection. 04/17/18 on evaluation today  patient actually appears to be showing signs of improvement in regard to the ulcer. Unfortunately he's had somewhat of a rough day due to the fact that he found out that one of his friends suddenly and unexpectedly passed today. He states he's not sure he's in the frame of mind to allow me to debride the wound at this point. Nonetheless I do feel like he is showing signs of the wound getting better little by little each week. 04/24/18 on evaluation today patient's ulcer actually appears to be showing some signs of improvement albeit slow. He has been tolerating the dressing changes without complication except for the wrap which she states was so tight that he had to remove it it was causing a lot of discomfort. Fortunately there is no evidence of infection at this point. He states he only wore the wrap until about the time he got home last week and that he had to take it off. Nonetheless he does not have any other compression stockings, Juxta-Lite wraps, or anything otherwise to really help at this point as far as compression is concerned. 05/01/18 on evaluation today patient actually appears to be doing fairly well in regard to his lower Barnes ulcer. He has been tolerating the dressing changes currently without complication. The wrap does seem to be helping as far as the fluid management is concerned. Wound bed itself actually show signs of good improvement although there is some Slough noted there's not as much as previous and he actually has fairly decent granulation noted as well. Overall I'm pleased with the progress of to this point. The patient would prefer not to have any debridement today he states he's actually not feeling too well in general and he really does not want any additional discomfort typically debridement is fairly uncomfortable for him. 05/08/18 on evaluation today patient actually appears to be doing a little better  in regard to his lower Barnes ulcer. He has been tolerating  the dressing changes without complication. With that being said I'm actually quite pleased with the fact the wound is not appear to be infected and in general has made a little progress. With that being said I do believe we need to perform some debridement to clean away the necrotic tissue on the surface of the wound. 05/15/18 on evaluation today patient actually appears to be doing a little better in regard to his wound. Fortunately there is some improvement noted fortunately there's also no significant evidence of infection at this point in time. He does have continued pain that really nothing different than previous he did get his Juxta-Lite wrap. 05/29/18 on evaluation today patient actually appears to be doing somewhat better in regard to his wound currently. He's been tolerating the dressing changes without complication. With that being said he has been performing the [facility] Dressing changes every day at home. I'm pretty sure that we told him every other day last week or when I saw him rather two Barnes ago. With that being said obviously did not hurt to do it daily just that obviously is gonna run out of supplies sooner and that could get him into trouble as far as his insurance is concerned. Nonetheless he at this point still continues to have pain although honestly I don't feel like it's as bad as what it was in the past just based on his reactions at this point. 06/12/18 on evaluation today patient actually appears to be doing better in regard to his lower Anthony ulcer. He's been tolerating the dressing changes without complication. Fortunately there does not appear to be any evidence of infection at this time. No fevers, chills, nausea, or vomiting noted at this time. 06/19/18 evaluation today patient's ulcer on the lower Barnes appears to be doing better at this point. he has been tolerating the dressing changes. The patient seems to be somewhat depressed about the progress of his  wound although the overall appearance at this time seems to be doing well. In general I'm very pleased with the appearance today he does have some biofilm on the surface of the wound as well as of hyper granular tissue we may want to utilize a little bit of silver nitrate today. 06/27/2018; patient comes into clinic today for a wound on the right lateral calf likely related to chronic venous insufficiency. He has been using medihoney covered with Hydrofera Blue. Wound surface actually looks quite good 07/03/2018 seen today for follow-up and management of lower Barnes ulcer right lateral shin. T olerating dressing changes. He has a small amount of biofilm today. Will attempt to remove a portion of the biofilm as he tolerates. He becomes very anxious with wound treatments. He would benefit from layer compression wraps however he refuses compression wraps or anything that smokes his legs. He expressed an inability to tolerate compression wraps. Juxta lite wraps have been ordered. He has been instructed on the appropriate application of the juxta leg wraps. His blood pressure today on visit was 200/120. He denies any blurred vision, chest pain, dizziness, shortness of breath, or difficulty with mobility. Mr. Arendt stated that he had a recent visit with his primary care provider. At that time his carvedilol was decreased from 25 mg daily to 12.5 mg daily. Strongly encouraged Mr. Reilley to seek medical attention today during visit. He declined transport for evaluation of elevated blood pressure. Encouraged him to contact his primary care  provider today for medication management. He stated that he would call his primary care doctor after wound visit today. Also encouraged him that if he experienced any symptoms of blurred vision, chest pain, shortness of breath to immediately seek medical attention. 07/17/18 on evaluation today patient's wound actually does seem to show signs of improvement based on the  overall appearance of the wound bed today. There does not appear to show any signs of infection which is good news. There is no overall worsening which is also good news. He still has hyper granular tissue will use in the [employer] followed by [facility] Dressing this point. 07/31/18 on evaluation today patient's wound bed actually show signs of improvement with good epithelialization especially in the upper portion of the wound. He has been tolerating the dressing changes without complication in general. Overall I'm extremely happy with how things stand. No fevers, chills, nausea, or vomiting noted at this time. 08/28/18 on evaluation today patient appears to be doing a little better in regard to his lower Barnes ulcer. With that being said it appears to be very hyper granular I think this is directly attributed to the fact that he continues to use the [employer] underneath the [facility] Dressing. Although this helps not to stick unfortunately also think he's not getting the benefit of the [facility] Dressing particular and subsequently is causing too much moisture buildup hence the hyper granulation. Fortunately there does not appear to be any signs of infection at this time which is good news. 09/03/18 on evaluation today patient appears to be doing well in regard to his lower Anthony ulcer. He has been tolerating the dressing changes without complication. Fortunately he has much less hyper granular tissue the noted last week I do think using silver nitrate in changing to using the [facility] Dressing without the mepitel has made a big difference for him this is good news. 09/18/18 on evaluation today patient appears to be doing excellent in regard to his right lower Anthony ulcer. This is actually significantly smaller even compared to the last evaluation. Overall I'm very pleased in this regard. I'm a recommend that we likely repeat the silver nitrate today based on what I'm  seeing. 10/02/18 on evaluation today patient appears to be doing excellent in regard to his lower Barnes wound on the right. He's been tolerating the dressing changes without complication. Fortunately there's no signs of infection at this time. Overall been very pleased with how things seem to be progressing. No fevers, chills, nausea, or vomiting noted at this time. 10/16/18 on evaluation today patient actually appears to be doing much better in regard to his lower Barnes wound on the right. This is shown signs of good improvement and is indeed measuring smaller he is on a excellent track as far as healing is concerned. My hope is this will be healed of the next several Barnes. Fortunately there is no evidence of infection at this time. He does seem to be doing everything that I'm recommending for him 10/30/18 on evaluation today patient appears to be doing better in regard to his lower Barnes ulcer. He's been tolerating the dressing changes without complication. Fortunately the Hydrofera Blue Dressing to be doing the job. He has made excellent progress. With that being said this is still going somewhat slow but nonetheless is always making good progress at this point. 5/18-Patient was in to be seen for right leg ulcer that appeared after scab above it was removed today. This area had  healed completely. 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 The wound has not healed though it has been nearly 2 months since the injury occurred. He was seen in the emergency department at [facility] on May 9. At that time it was very painful and he described it as a "15 on a scale of 010". He also was found to have 3+ pitting edema to the  bilateral lower extremities. He was prescribed a Barnes course of Keflex. He is here today because the wound has failed to heal and he has a prior history in our clinic. ABI in clinic today was noncompressible. He did have formal vascular studies performed in 2019 which were normal. The last hemoglobin A1c I have available for review was from March 21, 2021 and was elevated at 7.7%. The wound is fairly small and circular located about 3 inches above his right lateral malleolus. It is completely covered with eschar. No surrounding erythema, no odor, no purulent drainage. 01/11/2022: The wound on his right lateral leg is a little bit smaller today. It has reaccumulated some slough and a small bit of eschar. It remains fairly painful. He has another area on his anterior tibia that he will not let me touch but I am concerned that there is a wound forming underneath the surface. Electronic Signature(s) Signed: 01/11/2022 1:39:54 PM By: Anthony Maudlin MD FACS Entered By: Anthony Barnes on 01/11/2022 13:39:54 -------------------------------------------------------------------------------- Physical Exam Details Patient Name: Date of Service: Anthony Barnes, Anthony [name] 01/11/2022 12:45 PM Medical Record Number: [id number] Patient Account Number: [id number] Date of Birth/Sex: Treating RN: 27-Aug-1955 (66 y.o. Anthony Barnes Primary Care Provider: Charlott Barnes Other Clinician: Referring Provider: Treating Provider/Extender: Anthony Barnes in Treatment: 1 Constitutional He is hypertensive, but asymptomatic.. . . . No acute distress. Respiratory Normal work of breathing on room air. Notes 01/11/2022: The wound is a little bit smaller this week. He has accumulated some slough and eschar. The periwound skin is in good condition. Electronic Signature(s) Signed: 01/11/2022 1:40:40 PM By: Anthony Maudlin MD FACS Entered By: Anthony Barnes on 01/11/2022  13:40:39 -------------------------------------------------------------------------------- Physician Orders Details Patient Name: Date of Service: Anthony Barnes, Anthony [name] 01/11/2022 12:45 PM Medical Record Number: [id number] Patient Account Number: [id number] Date of Birth/Sex: Treating RN: 28-Jun-1956 (66 y.o. Anthony Barnes Primary Care Provider: Charlott Barnes Other Clinician: Referring Provider: Treating Provider/Extender: Anthony Barnes in Treatment: 1 Verbal / Phone Orders: No Diagnosis Coding ICD-10 Coding Code Description [phone] Non-pressure chronic ulcer of other part of right lower leg with fat layer exposed E11.622 Type 2 diabetes mellitus with other skin ulcer E11.9 Type 2 diabetes mellitus without complications [id number] Essential (primary) hypertension I87.2 Venous insufficiency (chronic) (peripheral) Follow-up Appointments ppointment in 1 week. - Dr. Celine Ahr - Room 2 - Return A Bathing/ Shower/ Hygiene May shower with protection but do not get wound dressing(s) wet. - may purchase a cast protector from [employer] [facility] Edema Control - Lymphedema / SCD / Other Elevate legs to the level of the heart or above for 30 minutes daily and/or when sitting, a frequency of: Avoid standing for long periods of time. Patient to wear own compression stockings every day. Exercise regularly Moisturize  legs daily. Compression stocking or Garment 20-30 mm/Hg pressure to: - to L left until R leg is healed Wound Treatment Wound #2 - Lower Leg Wound Laterality: Right, Lateral Cleanser: Soap and Water 1 x Per Week/30 Days Discharge Instructions: May shower and wash wound with dial antibacterial soap and water prior to dressing change. Cleanser: Wound Cleanser 1 x Per Week/30 Days Discharge Instructions: Cleanse the wound with wound cleanser prior to applying a clean dressing using gauze sponges, not tissue or cotton balls. Peri-Wound Care: Sween Lotion  (Moisturizing lotion) 1 x Per Week/30 Days Discharge Instructions: Apply moisturizing lotion as directed Prim Dressing: IODOFLEX 0.9% Cadexomer Iodine Pad 4x6 cm 1 x Per Week/30 Days ary Discharge Instructions: Apply to wound bed as instructed Secondary Dressing: Woven Gauze Sponge, Non-Sterile 4x4 in 1 x Per Week/30 Days Discharge Instructions: Apply over primary dressing as directed. Secured With: Coban Self-Adherent Wrap 4x5 (in/yd) 1 x Per Week/30 Days Discharge Instructions: Secure with Coban as directed. Secured With: [employer], 4.5x3.1 (in/yd) 1 x Per Week/30 Days Discharge Instructions: Secure with Kerlix as directed. Secured With: 47M Medipore H Soft Cloth Surgical T ape, 4 x 10 (in/yd) 1 x Per Week/30 Days Discharge Instructions: Secure with tape as directed. Electronic Signature(s) Signed: 01/11/2022 5:02:34 PM By: Anthony Maudlin MD FACS Entered By: Anthony Barnes on 01/11/2022 13:40:56 -------------------------------------------------------------------------------- Problem List Details Patient Name: Date of Service: Anthony Barnes, Anthony [name] 01/11/2022 12:45 PM Medical Record Number: [id number] Patient Account Number: [id number] Date of Birth/Sex: Treating RN: Mar 03, 1956 (66 y.o. Anthony Barnes Primary Care Provider: Charlott Barnes Other Clinician: Referring Provider: Treating Provider/Extender: Anthony Barnes in Treatment: 1 Active Problems ICD-10 Encounter Code Description Active Date MDM Diagnosis [phone] Non-pressure chronic ulcer of other part of right lower leg with fat layer 01/02/2022 No Yes exposed E11.622 Type 2 diabetes mellitus with other skin ulcer 01/02/2022 No Yes E11.9 Type 2 diabetes mellitus without complications [id number] No Yes I10 Essential (primary) hypertension 01/02/2022 No Yes I87.2 Venous insufficiency (chronic) (peripheral) 01/02/2022 No Yes Inactive Problems Resolved Problems Electronic  Signature(s) Signed: 01/11/2022 1:38:47 PM By: Anthony Maudlin MD FACS Entered By: Anthony Barnes on 01/11/2022 13:38:47 -------------------------------------------------------------------------------- Progress Note Details Patient Name: Date of Service: Anthony Barnes, Anthony [name] 01/11/2022 12:45 PM Medical Record Number: [id number] Patient Account Number: [id number] Date of Birth/Sex: Treating RN: 1955-12-24 (66 y.o. Anthony Barnes Primary Care Provider: Charlott Barnes Other Clinician: Referring Provider: Treating Provider/Extender: Anthony Barnes in Treatment: 1 Subjective Chief Complaint Information obtained from Patient Right LE Ulcer History of Present Illness (HPI) ADMISSION 03/12/18 This is a 65 year old and it was a type II diabetic reasonably poorly controlled with a recent hemoglobin A1c of 8.2. He was tending to his lawn on 01/15/18 when his weed Anthony Barnes sent a rock up word hitting him in the right anterior leg. He was seen in his primary M.D. office is same time. An x-ray showed no abnormality. He was given a course of cephalexin. Since then he's been applying peroxide and topical antibiotics to the wound. He does not have a history of chronic wounds however he does have chronic edema in the lower legs. He is complaining of pain in the right leg wound but no real history of claudication. Past medical history includes type 2 diabetes, hypertension, morbid obesity. ABIs in the right leg were noncompressible 03/19/18 on evaluation today patient appears to be tolerating the wrap in general fairly well. He states he's not having that much  pain in regard to the wound itself. With that being said he is having some issues with slough buildup on the surface of the wound the Iodoflex does seem to be helpful in that regard. He is still having discomfort he wonders if I can send in a prescription for ibuprofen or something of like to help him out. I do believe that  the prox end may be of benefit for him. 03/25/18 on evaluation today patient appears to be doing very well in regard to his right lateral lower Anthony Barnes ulcer. He's been tolerating the dressing changes without complication that is the wraps. Nonetheless he does continue to have pain he states is been dreading the possibility of having to have debridement again today. His first debridement experience was not optimal. With that being said he does seem to be showing signs of improvement there does appear to be little bit more granulation he does have a lot of slough that really does need to breeding away. 04/04/18;the patient went for arterial studies that were really quite normal. ABI on the right at 1.19 with triphasic waveforms on the left 1.11 with triphasic waveforms. TBI 0.93 on the right and 0.95 on the left. This suggests T should be able to tolerate even 4 layer compression. He is however complaining of a lot of pain with our wraps I'm wondering whether the pain is simply Iodoflex. I changed him to [employer] I'm still going to try to keep him in 3 layer compression 04/12/18 on evaluation today patient actually appears to be doing rather well in regard to his right lower Anthony ulcer. He is making good progress although it is slow still I do believe that the Santyl is much better than the Iodoflex. The good news is the patient seems to be tolerating the dressing changes without complication now that we've gotten good of the Iodoflex which really did burn him quite significantly. Otherwise there's no evidence of infection. 04/17/18 on evaluation today patient actually appears to be showing signs of improvement in regard to the ulcer. Unfortunately he's had somewhat of a rough day due to the fact that he found out that one of his friends suddenly and unexpectedly passed today. He states he's not sure he's in the frame of mind to allow me to debride the wound at this  point. Nonetheless I do feel like he is showing signs of the wound getting better little by little each week. 04/24/18 on evaluation today patient's ulcer actually appears to be showing some signs of improvement albeit slow. He has been tolerating the dressing changes without complication except for the wrap which she states was so tight that he had to remove it it was causing a lot of discomfort. Fortunately there is no evidence of infection at this point. He states he only wore the wrap until about the time he got home last week and that he had to take it off. Nonetheless he does not have any other compression stockings, Juxta-Lite wraps, or anything otherwise to really help at this point as far as compression is concerned. 05/01/18 on evaluation today patient actually appears to be doing fairly well in regard to his lower Barnes ulcer. He has been tolerating the dressing changes currently without complication. The wrap does seem to be helping as far as the fluid management is concerned. Wound bed itself actually show signs of good improvement although there is some Slough noted there's not as much as previous and he actually  has fairly decent granulation noted as well. Overall I'm pleased with the progress of to this point. The patient would prefer not to have any debridement today he states he's actually not feeling too well in general and he really does not want any additional discomfort typically debridement is fairly uncomfortable for him. 05/08/18 on evaluation today patient actually appears to be doing a little better in regard to his lower Barnes ulcer. He has been tolerating the dressing changes without complication. With that being said I'm actually quite pleased with the fact the wound is not appear to be infected and in general has made a little progress. With that being said I do believe we need to perform some debridement to clean away the necrotic tissue on the surface of the  wound. 05/15/18 on evaluation today patient actually appears to be doing a little better in regard to his wound. Fortunately there is some improvement noted fortunately there's also no significant evidence of infection at this point in time. He does have continued pain that really nothing different than previous he did get his Juxta-Lite wrap. 05/29/18 on evaluation today patient actually appears to be doing somewhat better in regard to his wound currently. He's been tolerating the dressing changes without complication. With that being said he has been performing the [facility] Dressing changes every day at home. I'm pretty sure that we told him every other day last week or when I saw him rather two Barnes ago. With that being said obviously did not hurt to do it daily just that obviously is gonna run out of supplies sooner and that could get him into trouble as far as his insurance is concerned. Nonetheless he at this point still continues to have pain although honestly I don't feel like it's as bad as what it was in the past just based on his reactions at this point. 06/12/18 on evaluation today patient actually appears to be doing better in regard to his lower Anthony ulcer. He's been tolerating the dressing changes without complication. Fortunately there does not appear to be any evidence of infection at this time. No fevers, chills, nausea, or vomiting noted at this time. 06/19/18 evaluation today patient's ulcer on the lower Barnes appears to be doing better at this point. he has been tolerating the dressing changes. The patient seems to be somewhat depressed about the progress of his wound although the overall appearance at this time seems to be doing well. In general I'm very pleased with the appearance today he does have some biofilm on the surface of the wound as well as of hyper granular tissue we may want to utilize a little bit of silver nitrate today. 06/27/2018; patient comes into  clinic today for a wound on the right lateral calf likely related to chronic venous insufficiency. He has been using medihoney covered with Hydrofera Blue. Wound surface actually looks quite good 07/03/2018 seen today for follow-up and management of lower Barnes ulcer right lateral shin. T olerating dressing changes. He has a small amount of biofilm today. Will attempt to remove a portion of the biofilm as he tolerates. He becomes very anxious with wound treatments. He would benefit from layer compression wraps however he refuses compression wraps or anything that smokes his legs. He expressed an inability to tolerate compression wraps. Juxta lite wraps have been ordered. He has been instructed on the appropriate application of the juxta leg wraps. His blood pressure today on visit was 200/120. He denies any blurred vision,  chest pain, dizziness, shortness of breath, or difficulty with mobility. Mr. Salb stated that he had a recent visit with his primary care provider. At that time his carvedilol was decreased from 25 mg daily to 12.5 mg daily. Strongly encouraged Mr. Radcliff to seek medical attention today during visit. He declined transport for evaluation of elevated blood pressure. Encouraged him to contact his primary care provider today for medication management. He stated that he would call his primary care doctor after wound visit today. Also encouraged him that if he experienced any symptoms of blurred vision, chest pain, shortness of breath to immediately seek medical attention. 07/17/18 on evaluation today patient's wound actually does seem to show signs of improvement based on the overall appearance of the wound bed today. There does not appear to show any signs of infection which is good news. There is no overall worsening which is also good news. He still has hyper granular tissue will use in the [employer] followed by [facility] Dressing this point. 07/31/18 on evaluation today  patient's wound bed actually show signs of improvement with good epithelialization especially in the upper portion of the wound. He has been tolerating the dressing changes without complication in general. Overall I'm extremely happy with how things stand. No fevers, chills, nausea, or vomiting noted at this time. 08/28/18 on evaluation today patient appears to be doing a little better in regard to his lower Barnes ulcer. With that being said it appears to be very hyper granular I think this is directly attributed to the fact that he continues to use the [employer] underneath the [facility] Dressing. Although this helps not to stick unfortunately also think he's not getting the benefit of the [facility] Dressing particular and subsequently is causing too much moisture buildup hence the hyper granulation. Fortunately there does not appear to be any signs of infection at this time which is good news. 09/03/18 on evaluation today patient appears to be doing well in regard to his lower Anthony ulcer. He has been tolerating the dressing changes without complication. Fortunately he has much less hyper granular tissue the noted last week I do think using silver nitrate in changing to using the [facility] Dressing without the mepitel has made a big difference for him this is good news. 09/18/18 on evaluation today patient appears to be doing excellent in regard to his right lower Anthony ulcer. This is actually significantly smaller even compared to the last evaluation. Overall I'm very pleased in this regard. I'm a recommend that we likely repeat the silver nitrate today based on what I'm seeing. 10/02/18 on evaluation today patient appears to be doing excellent in regard to his lower Barnes wound on the right. He's been tolerating the dressing changes without complication. Fortunately there's no signs of infection at this time. Overall been very pleased with how things seem to be progressing. No  fevers, chills, nausea, or vomiting noted at this time. 10/16/18 on evaluation today patient actually appears to be doing much better in regard to his lower Barnes wound on the right. This is shown signs of good improvement and is indeed measuring smaller he is on a excellent track as far as healing is concerned. My hope is this will be healed of the next several Barnes. Fortunately there is no evidence of infection at this time. He does seem to be doing everything that I'm recommending for him 10/30/18 on evaluation today patient appears to be doing better in regard to his lower Barnes  ulcer. He's been tolerating the dressing changes without complication. Fortunately the Hydrofera Blue Dressing to be doing the job. He has made excellent progress. With that being said this is still going somewhat slow but nonetheless is always making good progress at this point. 5/18-Patient was in to be seen for right leg ulcer that appeared after scab above it was removed today. This area had healed completely. 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 The wound has not healed though it has been nearly 2 months since the injury occurred. He was seen in the emergency department at [facility] on May 9. At that time it was very painful and he described it as a "15 on a scale of 0oo10". He also was found to have 3+ pitting edema to the bilateral lower extremities. He was prescribed a Barnes course of Keflex. He is here today because the wound has failed to heal and he has a prior history in our clinic. ABI in clinic today was noncompressible. He did have formal vascular studies performed in 2019 which were normal. The last hemoglobin A1c I  have available for review was from March 21, 2021 and was elevated at 7.7%. The wound is fairly small and circular located about 3 inches above his right lateral malleolus. It is completely covered with eschar. No surrounding erythema, no odor, no purulent drainage. 01/11/2022: The wound on his right lateral leg is a little bit smaller today. It has reaccumulated some slough and a small bit of eschar. It remains fairly painful. He has another area on his anterior tibia that he will not let me touch but I am concerned that there is a wound forming underneath the surface. Patient History Information obtained from Patient. Family History Cancer - Mother, Diabetes - Mother,Father, Hypertension - Mother,Father, Kidney Disease - Siblings, Stroke - Father, No family history of Heart Disease, Hereditary Spherocytosis, Lung Disease, Seizures, Thyroid Problems, Tuberculosis. Social History Former smoker - ended on 08/14/1990, Marital Status - Married, Alcohol Use - Moderate, Drug Use - No History, Caffeine Use - Daily. Medical History Eyes Denies history of Cataracts, Glaucoma, Optic Neuritis Ear/Nose/Mouth/Throat Denies history of Chronic sinus problems/congestion, Middle ear problems Hematologic/Lymphatic Denies history of Anemia, Hemophilia, Human Immunodeficiency Virus, Lymphedema, Sickle Cell Disease Respiratory Denies history of Aspiration, Asthma, Chronic Obstructive Pulmonary Disease (COPD), Pneumothorax, Sleep Apnea, Tuberculosis Cardiovascular Patient has history of Hypertension Denies history of Angina, Arrhythmia, Congestive Heart Failure, Coronary Artery Disease, Deep Vein Thrombosis, Hypotension, Myocardial Infarction, Peripheral Arterial Disease, Peripheral Venous Disease, Phlebitis, Vasculitis Gastrointestinal Denies history of Cirrhosis , Colitis, Crohnoos, Hepatitis A, Hepatitis B, Hepatitis C Endocrine Patient has history of Type II Diabetes Genitourinary Denies history of End  Stage Renal Disease Immunological Denies history of Lupus Erythematosus, Raynaudoos, Scleroderma Integumentary (Skin) Denies history of History of Burn Musculoskeletal Denies history of Gout, Rheumatoid Arthritis, Osteoarthritis, Osteomyelitis Neurologic Denies history of Dementia, Neuropathy, Quadriplegia, Paraplegia, Seizure Disorder Oncologic Denies history of Received Chemotherapy, Received Radiation Psychiatric Denies history of Anorexia/bulimia, Confinement Anxiety Hospitalization/Surgery History - esophagogastroduodenoscopy. - brain aneurysm surgery. Medical A Surgical History Notes nd Constitutional Symptoms (General Health) obesity Gastrointestinal diverticulitis Neurologic neuropathy Objective Constitutional He is hypertensive, but asymptomatic.Marland Kitchen No acute distress. Vitals Time Taken: 12:53 PM, Height:  75 in, Weight: 310 lbs, BMI: 38.7, Temperature: 97.8 F, Pulse: 76 bpm, Respiratory Rate: 18 breaths/min, Blood Pressure: 163/91 mmHg. Respiratory Normal work of breathing on room air. General Notes: 01/11/2022: The wound is a little bit smaller this week. He has accumulated some slough and eschar. The periwound skin is in good condition. Integumentary (Hair, Skin) Wound #2 status is Open. Original cause of wound was Trauma. The date acquired was: 12/03/2021. The wound has been in treatment 1 Barnes. The wound is located on the Right,Lateral Lower Leg. The wound measures 1.3cm length x 1.1cm width x 0.1cm depth; 1.123cm^2 area and 0.112cm^3 volume. There is Fat Layer (Subcutaneous Tissue) exposed. There is no tunneling or undermining noted. There is a medium amount of serosanguineous drainage noted. The wound margin is distinct with the outline attached to the wound base. There is medium (34-66%) red granulation within the wound bed. There is a medium (34-66%) amount of necrotic tissue within the wound bed including Adherent Slough. Assessment Active  Problems ICD-10 Non-pressure chronic ulcer of other part of right lower leg with fat layer exposed Type 2 diabetes mellitus with other skin ulcer Type 2 diabetes mellitus without complications Essential (primary) hypertension Venous insufficiency (chronic) (peripheral) Procedures Wound #2 Pre-procedure diagnosis of Wound #2 is a Venous Leg Ulcer located on the Right,Lateral Lower Leg .Severity of Tissue Pre Debridement is: Fat layer exposed. There was a Excisional Skin/Subcutaneous Tissue Debridement with a total area of 1.43 sq cm performed by Anthony Maudlin, MD. With the following instrument(s): Curette to remove Viable and Non-Viable tissue/material. Material removed includes Subcutaneous Tissue and Slough and after achieving pain control using Lidocaine 4% T opical Solution. No specimens were taken. A time out was conducted at 13:18, prior to the start of the procedure. A Minimum amount of bleeding was controlled with Pressure. The procedure was tolerated well with a pain level of 8 throughout and a pain level of 2 following the procedure. Post Debridement Measurements: 1.3cm length x 1.1cm width x 0.1cm depth; 0.112cm^3 volume. Character of Wound/Ulcer Post Debridement is improved. Severity of Tissue Post Debridement is: Fat layer exposed. Post procedure Diagnosis Wound #2: Same as Pre-Procedure Plan Follow-up Appointments: Return Appointment in 1 week. - Dr. Celine Ahr - Room 2 - Bathing/ Shower/ Hygiene: May shower with protection but do not get wound dressing(s) wet. - may purchase a cast protector from [place] or [facility] Edema Control - Lymphedema / SCD / Other: Elevate legs to the level of the heart or above for 30 minutes daily and/or when sitting, a frequency of: Avoid standing for long periods of time. Patient to wear own compression stockings every day. Exercise regularly Moisturize legs daily. Compression stocking or Garment 20-30 mm/Hg pressure to: - to L left until R leg is  healed WOUND #2: - Lower Leg Wound Laterality: Right, Lateral Cleanser: Soap and Water 1 x Per Week/30 Days Discharge Instructions: May shower and wash wound with dial antibacterial soap and water prior to dressing change. Cleanser: Wound Cleanser 1 x Per Week/30 Days Discharge Instructions: Cleanse the wound with wound cleanser prior to applying a clean dressing using gauze sponges, not tissue or cotton balls. Peri-Wound Care: Sween Lotion (Moisturizing lotion) 1 x Per Week/30 Days Discharge Instructions: Apply moisturizing lotion as directed Prim Dressing: IODOFLEX 0.9% Cadexomer Iodine Pad 4x6 cm 1 x Per Week/30 Days ary Discharge Instructions: Apply to wound bed as instructed Secondary Dressing: Woven Gauze Sponge, Non-Sterile 4x4 in 1 x Per Week/30 Days Discharge Instructions: Apply over primary  dressing as directed. Secured With: Coban Self-Adherent Wrap 4x5 (in/yd) 1 x Per Week/30 Days Discharge Instructions: Secure with Coban as directed. Secured With: [employer] Roll Sterile, 4.5x3.1 (in/yd) 1 x Per Week/30 Days Discharge Instructions: Secure with Kerlix as directed. Secured With: 9M Medipore H Soft Cloth Surgical T ape, 4 x 10 (in/yd) 1 x Per Week/30 Days Discharge Instructions: Secure with tape as directed. 01/11/2022: The wound is a little bit smaller this week. He has accumulated some slough and eschar. The periwound skin is in good condition. I used a curette to debride the slough and eschar from the wound. We will continue using Iodoflex. Due to noncompressible ABIs, we are using Kerlix and Coban. We have placed a referral to [facility] for vascular studies, but he has not yet received an appointment. I will see him back in 1 week. He was encouraged to contact [facility] to see if he could get scheduled. Electronic Signature(s) Signed: 01/11/2022 1:41:46 PM By: Duanne Guessannon, Havish Petties MD FACS Entered By: Duanne Guessannon, Tavien Chestnut on 01/11/2022  13:41:46 -------------------------------------------------------------------------------- HxROS Details Patient Name: Date of Service: Anthony Barnes, Anthony [name] 01/11/2022 12:45 PM Medical Record Number: [id number] Patient Account Number: [id number] Date of Birth/Sex: Treating RN: Sep 20, 1955 (66 y.o. [name]) Herrington, Taylor Primary Care Provider: Hoy RegisterNewlin, Enobong Other Clinician: Referring Provider: Treating Provider/Extender: Camillo Flamingannon, Naydene Kamrowski Newlin, Enobong Barnes in Treatment: 1 Information Obtained From Patient Constitutional Symptoms (General Health) Medical History: Past Medical History Notes: obesity Eyes Medical History: Negative for: Cataracts; Glaucoma; Optic Neuritis Ear/Nose/Mouth/Throat Medical History: Negative for: Chronic sinus problems/congestion; Middle ear problems Hematologic/Lymphatic Medical History: Negative for: Anemia; Hemophilia; Human Immunodeficiency Virus; Lymphedema; Sickle Cell Disease Respiratory Medical History: Negative for: Aspiration; Asthma; Chronic Obstructive Pulmonary Disease (COPD); Pneumothorax; Sleep Apnea; Tuberculosis Cardiovascular Medical History: Positive for: Hypertension Negative for: Angina; Arrhythmia; Congestive Heart Failure; Coronary Artery Disease; Deep Vein Thrombosis; Hypotension; Myocardial Infarction; Peripheral Arterial Disease; Peripheral Venous Disease; Phlebitis; Vasculitis Gastrointestinal Medical History: Negative for: Cirrhosis ; Colitis; Crohns; Hepatitis A; Hepatitis B; Hepatitis C Past Medical History Notes: diverticulitis Endocrine Medical History: Positive for: Type II Diabetes Time with diabetes: 8 years Treated with: Oral agents Blood sugar tested every day: No Genitourinary Medical History: Negative for: End Stage Renal Disease Immunological Medical History: Negative for: Lupus Erythematosus; Raynauds; Scleroderma Integumentary (Skin) Medical History: Negative for: History of  Burn Musculoskeletal Medical History: Negative for: Gout; Rheumatoid Arthritis; Osteoarthritis; Osteomyelitis Neurologic Medical History: Negative for: Dementia; Neuropathy; Quadriplegia; Paraplegia; Seizure Disorder Past Medical History Notes: neuropathy Oncologic Medical History: Negative for: Received Chemotherapy; Received Radiation Psychiatric Medical History: Negative for: Anorexia/bulimia; Confinement Anxiety Immunizations Pneumococcal Vaccine: Received Pneumococcal Vaccination: No Immunization Notes: tetanus 2 years ago Implantable Devices None Hospitalization / Surgery History Type of Hospitalization/Surgery esophagogastroduodenoscopy brain aneurysm surgery Family and Social History Cancer: Yes - Mother; Diabetes: Yes - Mother,Father; Heart Disease: No; Hereditary Spherocytosis: No; Hypertension: Yes - Mother,Father; Kidney Disease: Yes - Siblings; Lung Disease: No; Seizures: No; Stroke: Yes - Father; Thyroid Problems: No; Tuberculosis: No; Former smoker - ended on 08/14/1990; Marital Status - Married; Alcohol Use: Moderate; Drug Use: No History; Caffeine Use: Daily; Financial Concerns: No; Food, Clothing or Shelter Needs: No; Support System Lacking: No; Transportation Concerns: No Psychologist, prison and probation serviceslectronic Signature(s) Signed: 01/11/2022 5:02:34 PM By: Duanne Guessannon, Ameya Kutz MD FACS Signed: 01/11/2022 5:12:22 PM By: Samuella BruinHerrington, Taylor Entered By: Duanne Guessannon, Blase Beckner on 01/11/2022 13:40:00 -------------------------------------------------------------------------------- SuperBill Details Patient Name: Date of Service: Anthony Barnes, Anthony [name] 01/11/2022 Medical Record Number: [id number] Patient Account Number: [id number] Date of Birth/Sex: Treating RN: Sep 20, 1955 (66 y.o. M)  Anthony Barnes Primary Care Provider: Charlott Barnes Other Clinician: Referring Provider: Treating Provider/Extender: Anthony Barnes in Treatment: 1 Diagnosis Coding ICD-10 Codes Code  Description [phone] Non-pressure chronic ulcer of other part of right lower leg with fat layer exposed E11.622 Type 2 diabetes mellitus with other skin ulcer E11.9 Type 2 diabetes mellitus without complications [id number] Essential (primary) hypertension I87.2 Venous insufficiency (chronic) (peripheral) Facility Procedures CPT4 Code: [id number] Description: [id number] - DEB SUBQ TISSUE 20 SQ CM/< ICD-10 Diagnosis Description 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 Modifier: osed Quantity: 1 Physician Procedures : CPT4 Code Description Modifier [id number] - WC PHYS LEVEL 3 - EST PT 25 ICD-10 Diagnosis Description 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osed E11.622 Type 2 diabetes mellitus with other skin ulcer  I10 Essential (primary) hypertension I87.2 Venous insufficiency (chronic) (peripheral) Quantity: 1 Electronic Signature(s) Signed: 01/11/2022 1:42:03 PM By: Anthony Maudlin MD FACS Entered By: Anthony Barnes on 01/11/2022 13:42:03

## 2022-01-11 NOTE — Telephone Encounter (Signed)
Call received from [facility] agent c/o leg pain and to receive medication prior to appt. And was disconnected from agent. NT called patient back and patient requesting to call back due to get wound debridement. Patient reports he will call back.

## 2022-01-12 ENCOUNTER — Telehealth: Payer: Self-pay | Admitting: Family Medicine

## 2022-01-12 MED ORDER — TRAMADOL HCL 50 MG PO TABS: ORAL_TABLET | ORAL | 0 refills | Status: DC

## 2022-01-12 NOTE — Telephone Encounter (Signed)
Copied from CRM [phone] [id number]. Topic: General - Other >> Jan 12, 2022 11:48 AM Pawlus, Maxine Glenn A wrote: Reason for CRM: [facility] called in and stated the pt has an allergy on file that would interact with traMADol (ULTRAM) 50 MG tablet, please advise.

## 2022-01-12 NOTE — Telephone Encounter (Signed)
Prescription has been sent to his pharmacy.

## 2022-01-12 NOTE — Addendum Note (Signed)
Addended by: Hoy Register on: 01/12/2022 10:19 AM   Modules accepted: Orders

## 2022-01-16 NOTE — Telephone Encounter (Signed)
Routing to PCP for review.

## 2022-01-16 NOTE — Telephone Encounter (Signed)
Call placed to patient and mailbox is currently full.  Medication has been sent to pharmacy.

## 2022-01-16 NOTE — Telephone Encounter (Signed)
He has tolerated this in the past.

## 2022-01-18 ENCOUNTER — Encounter (HOSPITAL_BASED_OUTPATIENT_CLINIC_OR_DEPARTMENT_OTHER): Payer: Medicare Other | Attending: General Surgery | Admitting: General Surgery

## 2022-01-18 DIAGNOSIS — E11622 Type 2 diabetes mellitus with other skin ulcer: Secondary | ICD-10-CM | POA: Insufficient documentation

## 2022-01-18 DIAGNOSIS — Y93H2 Activity, gardening and landscaping: Secondary | ICD-10-CM | POA: Diagnosis not present

## 2022-01-18 DIAGNOSIS — W228XXA Striking against or struck by other objects, initial encounter: Secondary | ICD-10-CM | POA: Diagnosis not present

## 2022-01-18 DIAGNOSIS — Z87891 Personal history of nicotine dependence: Secondary | ICD-10-CM | POA: Diagnosis not present

## 2022-01-18 DIAGNOSIS — L97812 Non-pressure chronic ulcer of other part of right lower leg with fat layer exposed: Secondary | ICD-10-CM | POA: Insufficient documentation

## 2022-01-18 DIAGNOSIS — S81801A Unspecified open wound, right lower leg, initial encounter: Secondary | ICD-10-CM | POA: Diagnosis not present

## 2022-01-18 DIAGNOSIS — I10 Essential (primary) hypertension: Secondary | ICD-10-CM | POA: Diagnosis not present

## 2022-01-18 DIAGNOSIS — I872 Venous insufficiency (chronic) (peripheral): Secondary | ICD-10-CM | POA: Diagnosis not present

## 2022-01-18 DIAGNOSIS — Z6838 Body mass index (BMI) 38.0-38.9, adult: Secondary | ICD-10-CM | POA: Insufficient documentation

## 2022-01-18 DIAGNOSIS — E1165 Type 2 diabetes mellitus with hyperglycemia: Secondary | ICD-10-CM | POA: Diagnosis not present

## 2022-01-18 NOTE — Progress Notes (Signed)
Anthony Barnes ([id number]) Visit Report for 01/18/2022 Arrival Information Details Patient Name: Date of Service: Anthony Barnes 01/18/2022 12:45 PM Medical Record Number: [id number] Patient Account Number: [id number] Date of Birth/Sex: Treating RN: [birth year] (66 y.o. Anthony Barnes Primary Care Anthony Barnes: Anthony Barnes Other Clinician: Referring Anthony Barnes: Treating Anthony Barnes/Extender: Anthony Barnes Weeks in Treatment: Barnes Visit Information History Since Last Visit Added or deleted any medications: No Patient Arrived: Ambulatory Any new allergies or adverse reactions: No Arrival Time: 12:53 Had a fall or experienced change in No Accompanied By: self activities of daily living that may affect Transfer Assistance: None risk of falls: Patient Identification Verified: Yes Signs or symptoms of abuse/neglect since last visito No Secondary Verification Process Completed: Yes Hospitalized since last visit: No Patient Requires Transmission-Based Precautions: No Implantable device outside of the clinic excluding No cellular tissue based products placed in the center since last visit: Has Dressing in Place as Prescribed: Yes Has Compression in Place as Prescribed: Yes Pain Present Now: Yes Electronic Signature(s) Signed: 01/18/2022 4:55:42 PM By: Anthony Barnes Entered By: Anthony Barnes on 01/18/2022 12:55:36 -------------------------------------------------------------------------------- Encounter Discharge Information Details Patient Name: Date of Service: Anthony Barnes, Anthony [name] 01/18/2022 12:45 PM Medical Record Number: [id number] Patient Account Number: [id number] Date of Birth/Sex: Treating RN: 02/21/1956 (66 y.o. Anthony Barnes Primary Care Beda Dula: Anthony Barnes Other Clinician: Referring Anthony Barnes: Treating Anthony Barnes Encounter Discharge Information Items Post  Procedure Vitals Discharge Condition: Stable Temperature (F): 97.8 Ambulatory Status: Ambulatory Pulse (bpm): 71 Discharge Destination: Home Respiratory Rate (breaths/min): 18 Transportation: Private Auto Blood Pressure (mmHg): 146/93 Accompanied By: self Schedule Follow-up Appointment: Yes Clinical Summary of Care: Patient Declined Electronic Signature(s) Signed: 01/18/2022 4:55:42 PM By: Anthony Barnes Entered By: Anthony Barnes on 01/18/2022 13:27:41 -------------------------------------------------------------------------------- Lower Extremity Assessment Details Patient Name: Date of Service: Anthony Barnes [name] 01/18/2022 12:45 PM Medical Record Number: [id number] Patient Account Number: [id number] Date of Birth/Sex: Treating RN: [birth year] (66 y.o. Anthony Barnes Primary Care Illana Nolting: Anthony Barnes Other Clinician: Referring Anthony Barnes: Treating Anthony Barnes/Extender: Anthony Barnes Weeks in Treatment: Barnes Edema Assessment Assessed: [Left: No] [Right: No] E[Left: dema] [Right: :] Calf Left: Right: Point of Measurement: From Medial Instep 46.8 cm Ankle Left: Right: Point of Measurement: From Medial Instep 28.3 cm Vascular Assessment Pulses: Dorsalis Pedis Palpable: [Right:Yes] Electronic Signature(s) Signed: 01/18/2022 4:55:42 PM By: Anthony Barnes Entered By: Anthony Barnes on 01/18/2022 13:02:40 -------------------------------------------------------------------------------- Multi Wound Chart Details Patient Name: Date of Service: Anthony Barnes, Anthony [name] 01/18/2022 12:45 PM Medical Record Number: [id number] Patient Account Number: [id number] Date of Birth/Sex: Treating RN: Jan 30, 1956 (66 y.o. Anthony Barnes Primary Care Brionne Mertz: Anthony Barnes Other Clinician: Referring Kiernan Atkerson: Treating Anthony Barnes/Extender: Anthony Barnes Weeks in Treatment: Barnes Vital Signs Height(in): 75 Pulse(bpm):  71 Weight(lbs): 310 Blood Pressure(mmHg): 146/93 Body Mass Index(BMI): 38.7 Temperature(F): 97.8 Respiratory Rate(breaths/min): 18 Photos: [N/A:N/A] Right, Lateral Lower Leg N/A N/A Wound Location: Trauma N/A N/A Wounding Event: Venous Leg Ulcer N/A N/A Primary Etiology: Hypertension, Type II Diabetes N/A N/A Comorbid History: 12/03/2021 N/A N/A Date Acquired: Barnes N/A N/A Weeks of Treatment: Open N/A N/A Wound Status: No N/A N/A Wound Recurrence: Barnes.7x1.1x0.1 N/A N/A Measurements L x W x D (cm) Barnes.333 N/A N/A A (cm) : rea 0.233 N/A N/A Volume (cm) : -120.10% N/A N/A % Reduction in A rea: -119.80% N/A N/A % Reduction in Volume: Full Thickness Without Exposed N/A N/A Classification: Support Structures Medium N/A  N/A Exudate A mount: Serosanguineous N/A N/A Exudate Type: red, brown N/A N/A Exudate Color: Distinct, outline attached N/A N/A Wound Margin: Medium (34-66%) N/A N/A Granulation Amount: Red N/A N/A Granulation Quality: Medium (34-66%) N/A N/A Necrotic Amount: Eschar, Adherent Slough N/A N/A Necrotic Tissue: Fat Layer (Subcutaneous Tissue): Yes N/A N/A Exposed Structures: Fascia: No Tendon: No Muscle: No Joint: No Bone: No Small (1-33%) N/A N/A Epithelialization: Treatment Notes Electronic Signature(s) Signed: 01/18/2022 1:15:39 PM By: Anthony Maudlin MD FACS Signed: 01/18/2022 4:55:42 PM By: Anthony Barnes Entered By: Anthony Barnes on 01/18/2022 13:15:38 -------------------------------------------------------------------------------- Multi-Disciplinary Care Plan Details Patient Name: Date of Service: Anthony Barnes, Anthony [name] 01/18/2022 12:45 PM Medical Record Number: [id number] Patient Account Number: [id number] Date of Birth/Sex: Treating RN: 1956-07-29 (66 y.o. Anthony Barnes Primary Care Arnett Galindez: Anthony Barnes Other Clinician: Referring Jaimon Bugaj: Treating Anthony Barnes/Extender: Anthony Barnes Weeks in  Treatment: Barnes Active Inactive Venous Leg Ulcer Nursing Diagnoses: Actual venous Insuffiency (use after diagnosis is confirmed) Knowledge deficit related to disease process and management Goals: Patient will maintain optimal edema control Date Initiated: 01/02/2022 Target Resolution Date: 02/03/2022 Goal Status: Active Patient/caregiver will verbalize understanding of disease process and disease management Date Initiated: 01/02/2022 Target Resolution Date: 02/03/2022 Goal Status: Active Interventions: Assess peripheral edema status every visit. Compression as ordered Provide education on venous insufficiency Treatment Activities: Non-invasive vascular studies : 01/02/2022 T ordered outside of clinic : 01/02/2022 est Therapeutic compression applied : 01/02/2022 Notes: Wound/Skin Impairment Nursing Diagnoses: Impaired tissue integrity Knowledge deficit related to ulceration/compromised skin integrity Goals: Patient/caregiver will verbalize understanding of skin care regimen Date Initiated: 01/02/2022 Target Resolution Date: 02/03/2022 Goal Status: Active Ulcer/skin breakdown will have a volume reduction of 30% by week 4 Date Initiated: 01/02/2022 Target Resolution Date: 02/03/2022 Goal Status: Active Interventions: Assess ulceration(s) every visit Provide education on ulcer and skin care Treatment Activities: Skin care regimen initiated : 01/02/2022 Topical wound management initiated : 01/02/2022 Notes: Electronic Signature(s) Signed: 01/18/2022 4:55:42 PM By: Anthony Barnes Entered By: Anthony Barnes on 01/18/2022 13:04:20 -------------------------------------------------------------------------------- Pain Assessment Details Patient Name: Date of Service: Anthony Barnes [name] 01/18/2022 12:45 PM Medical Record Number: [id number] Patient Account Number: [id number] Date of Birth/Sex: Treating RN: [birth year] (66 y.o. Anthony Barnes Primary Care Janalee Grobe: Anthony Barnes Other Clinician: Referring Lorene Klimas: Treating Catlynn Grondahl/Extender: Anthony Barnes Weeks in Treatment: Barnes Active Problems Location of Pain Severity and Description of Pain Patient Has Paino Yes Site Locations Pain Location: Generalized Pain Duration of the Pain. Constant / Intermittento Intermittent Rate the pain. Current Pain Level: 3 Character of Pain Describe the Pain: Burning, Sharp Pain Management and Medication Current Pain Management: Medication: Yes Electronic Signature(s) Signed: 01/18/2022 4:55:42 PM By: Anthony Barnes Entered By: Anthony Barnes on 01/18/2022 12:56:11 -------------------------------------------------------------------------------- Patient/Caregiver Education Details Patient Name: Date of Service: Anthony Barnes 6/7/2023andnbsp12:45 PM Medical Record Number: [id number] Patient Account Number: [id number] Date of Birth/Gender: Treating RN: 01/14/1956 (66 y.o. Anthony Barnes Primary Care Physician: Anthony Barnes Other Clinician: Referring Physician: Treating Physician/Extender: Anthony Barnes Weeks in Treatment: Barnes Education Assessment Education Provided To: Patient Education Topics Provided Wound/Skin Impairment: Methods: Explain/Verbal Responses: Reinforcements needed, State content correctly Electronic Signature(s) Signed: 01/18/2022 4:55:42 PM By: Anthony Barnes Entered By: Anthony Barnes on 01/18/2022 13:04:35 -------------------------------------------------------------------------------- Wound Assessment Details Patient Name: Date of Service: Anthony Barnes [name] 01/18/2022 12:45 PM Medical Record Number: [id number] Patient Account Number: [id number] Date of Birth/Sex: Treating RN: 07-27-1956 (66 y.o. Anthony Barnes Primary Care Nashika Coker:  Anthony Barnes Other Clinician: Referring Weslie Pretlow: Treating Ellawyn Wogan/Extender: Anthony Barnes Weeks  in Treatment: Barnes Wound Status Wound Number: Barnes Primary Etiology: Venous Leg Ulcer Wound Location: Right, Lateral Lower Leg Wound Status: Open Wounding Event: Trauma Comorbid History: Hypertension, Type II Diabetes Date Acquired: 12/03/2021 Weeks Of Treatment: Barnes Clustered Wound: No Photos Wound Measurements Length: (cm) Barnes.7 Width: (cm) 1.1 Depth: (cm) 0.1 Area: (cm) Barnes.333 Volume: (cm) 0.233 % Reduction in Area: -120.1% % Reduction in Volume: -119.8% Epithelialization: Small (1-33%) Tunneling: No Undermining: No Wound Description Classification: Full Thickness Without Exposed Support Structures Wound Margin: Distinct, outline attached Exudate Amount: Medium Exudate Type: Serosanguineous Exudate Color: red, brown Foul Odor After Cleansing: No Slough/Fibrino Yes Wound Bed Granulation Amount: Medium (34-66%) Exposed Structure Granulation Quality: Red Fascia Exposed: No Necrotic Amount: Medium (34-66%) Fat Layer (Subcutaneous Tissue) Exposed: Yes Necrotic Quality: Eschar, Adherent Slough Tendon Exposed: No Muscle Exposed: No Joint Exposed: No Bone Exposed: No Treatment Notes Wound #Barnes (Lower Leg) Wound Laterality: Right, Lateral Cleanser Soap and Water Discharge Instruction: May shower and wash wound with dial antibacterial soap and water prior to dressing change. Wound Cleanser Discharge Instruction: Cleanse the wound with wound cleanser prior to applying a clean dressing using gauze sponges, not tissue or cotton balls. Peri-Wound Care Sween Lotion (Moisturizing lotion) Discharge Instruction: Apply moisturizing lotion as directed Topical Primary Dressing IODOFLEX 0.9% Cadexomer Iodine Pad 4x6 cm Discharge Instruction: Apply to wound bed as instructed Secondary Dressing Woven Gauze Sponge, Non-Sterile 4x4 in Discharge Instruction: Apply over primary dressing as directed. Secured With [employer] 4x5 (in/yd) Discharge Instruction: Secure with Coban as  directed. Kerlix Roll Sterile, 4.5x3.1 (in/yd) Discharge Instruction: Secure with Kerlix as directed. 84M Medipore H Soft Cloth Surgical T ape, 4 x 10 (in/yd) Discharge Instruction: Secure with tape as directed. Compression Wrap Compression Stockings Add-Ons Electronic Signature(s) Signed: 01/18/2022 4:55:42 PM By: Anthony Barnes Entered By: Anthony Barnes on 01/18/2022 13:06:55 -------------------------------------------------------------------------------- Vitals Details Patient Name: Date of Service: Anthony Barnes, Anthony [name] 01/18/2022 12:45 PM Medical Record Number: [id number] Patient Account Number: [id number] Date of Birth/Sex: Treating RN: 1955-09-08 (66 y.o. Anthony Barnes Primary Care Novice Vrba: Anthony Barnes Other Clinician: Referring Graydon Fofana: Treating Daryon Remmert/Extender: Anthony Barnes Weeks in Treatment: Barnes Vital Signs Time Taken: 12:55 Temperature (F): 97.8 Height (in): 75 Pulse (bpm): 71 Weight (lbs): 310 Respiratory Rate (breaths/min): 18 Body Mass Index (BMI): 38.7 Blood Pressure (mmHg): 146/93 Reference Range: 80 - 120 mg / dl Electronic Signature(s) Signed: 01/18/2022 4:55:42 PM By: Anthony Barnes Entered By: Anthony Barnes on 01/18/2022 12:55:53

## 2022-01-18 NOTE — Progress Notes (Addendum)
JANZEN, SACKS ([id number]) Visit Report for 01/18/2022 Chief Complaint Document Details Patient Name: Date of Service: Anthony Barnes 01/18/2022 12:45 PM Medical Record Number: [id number] Patient Account Number: [id number] Date of Birth/Sex: Treating RN: [birth year] (66 y.o. [name] Primary Care Provider: Hoy Register Other Clinician: Referring Provider: Treating Provider/Extender: Camillo Flaming Weeks in Treatment: 2 Information Obtained from: Patient Chief Complaint Right LE Ulcer Electronic Signature(s) Signed: 01/18/2022 1:15:45 PM By: Duanne Guess MD FACS Entered By: Duanne Guess on 01/18/2022 13:15:45 -------------------------------------------------------------------------------- Debridement Details Patient Name: Date of Service: Anthony Barnes, Anthony [name] 01/18/2022 12:45 PM Medical Record Number: [id number] Patient Account Number: [id number] Date of Birth/Sex: Treating RN: 04-24-1956 (66 y.o. [name] Primary Care Provider: Hoy Register Other Clinician: Referring Provider: Treating Provider/Extender: Camillo Flaming Weeks in Treatment: 2 Debridement Performed for Assessment: Wound #2 Right,Lateral Lower Leg Performed By: Physician Duanne Guess, MD Debridement Type: Debridement Severity of Tissue Pre Debridement: Fat layer exposed Level of Consciousness (Pre-procedure): Awake and Alert Pre-procedure Verification/Time Out Yes - 13:11 Taken: Start Time: 13:11 Pain Control: Lidocaine 4% T opical Solution T Area Debrided (L x W): otal 2.7 (cm) x 1.1 (cm) = 2.97 (cm) Tissue and other material debrided: Viable, Non-Viable, Slough, Subcutaneous, Slough Level: Skin/Subcutaneous Tissue Debridement Description: Excisional Instrument: Curette Bleeding: Minimum Hemostasis Achieved: Pressure Procedural Pain: 10 Post Procedural Pain: 3 Response to Treatment: Procedure was tolerated well Level of  Consciousness (Post- Awake and Alert procedure): Post Debridement Measurements of Total Wound Length: (cm) 2.7 Width: (cm) 1.1 Depth: (cm) 0.1 Volume: (cm) 0.233 Character of Wound/Ulcer Post Debridement: Improved Severity of Tissue Post Debridement: Fat layer exposed Post Procedure Diagnosis Same as Pre-procedure Electronic Signature(s) Signed: 01/18/2022 2:37:03 PM By: Duanne Guess MD FACS Signed: 01/18/2022 4:55:42 PM By: Samuella Bruin Entered By: Samuella Bruin on 01/18/2022 13:22:45 -------------------------------------------------------------------------------- HPI Details Patient Name: Date of Service: Anthony Barnes, Anthony [name] 01/18/2022 12:45 PM Medical Record Number: [id number] Patient Account Number: [id number] Date of Birth/Sex: Treating RN: 13-Apr-1956 (66 y.o. [name] Primary Care Provider: Hoy Register Other Clinician: Referring Provider: Treating Provider/Extender: Camillo Flaming Weeks in Treatment: 2 History of Present Illness HPI Description: ADMISSION 03/12/18 This is a 66 year old and it was a type II diabetic reasonably poorly controlled with a recent hemoglobin A1c of 8.2. He was tending to his lawn on 01/15/18 when his weed [name] sent a rock up word hitting him in the right anterior leg. He was seen in his primary M.D. office is same time. An x-ray showed no abnormality. He was given a course of cephalexin. Since then he's been applying peroxide and topical antibiotics to the wound. He does not have a history of chronic wounds however he does have chronic edema in the lower legs. He is complaining of pain in the right leg wound but no real history of claudication. Past medical history includes type 2 diabetes, hypertension, morbid obesity. ABIs in the right leg were noncompressible 03/19/18 on evaluation today patient appears to be tolerating the wrap in general fairly well. He states he's not having that much pain in regard  to the wound itself. With that being said he is having some issues with slough buildup on the surface of the wound the Iodoflex does seem to be helpful in that regard. He is still having discomfort he wonders if I can send in a prescription for ibuprofen or something of like to help him out. I do believe that the prox  end may be of benefit for him. 03/25/18 on evaluation today patient appears to be doing very well in regard to his right lateral lower [name]. He's been tolerating the dressing changes without complication that is the wraps. Nonetheless he does continue to have pain he states is been dreading the possibility of having to have debridement again today. His first debridement experience was not optimal. With that being said he does seem to be showing signs of improvement there does appear to be little bit more granulation he does have a lot of slough that really does need to breeding away. 04/04/18;the patient went for arterial studies that were really quite normal. ABI on the right at 1.19 with triphasic waveforms on the left 1.11 with triphasic waveforms. TBI 0.93 on the right and 0.95 on the left. This suggests T should be able to tolerate even 4 layer compression. He is however complaining of a lot of pain with our wraps I'm wondering whether the pain is simply Iodoflex. I changed him to [employer] covering Hydrofera Blue. I'm still going to try to keep him in 3 layer compression 04/12/18 on evaluation today patient actually appears to be doing rather well in regard to his right lower [name] ulcer. He is making good progress although it is slow still I do believe that the Santyl is much better than the Iodoflex. The good news is the patient seems to be tolerating the dressing changes without complication now that we've gotten good of the Iodoflex which really did burn him quite significantly. Otherwise there's no evidence of infection. 04/17/18 on evaluation today patient  actually appears to be showing signs of improvement in regard to the ulcer. Unfortunately he's had somewhat of a rough day due to the fact that he found out that one of his friends suddenly and unexpectedly passed today. He states he's not sure he's in the frame of mind to allow me to debride the wound at this point. Nonetheless I do feel like he is showing signs of the wound getting better little by little each week. 04/24/18 on evaluation today patient's ulcer actually appears to be showing some signs of improvement albeit slow. He has been tolerating the dressing changes without complication except for the wrap which she states was so tight that he had to remove it it was causing a lot of discomfort. Fortunately there is no evidence of infection at this point. He states he only wore the wrap until about the time he got home last week and that he had to take it off. Nonetheless he does not have any other compression stockings, Juxta-Lite wraps, or anything otherwise to really help at this point as far as compression is concerned. 05/01/18 on evaluation today patient actually appears to be doing fairly well in regard to his lower extremity ulcer. He has been tolerating the dressing changes currently without complication. The wrap does seem to be helping as far as the fluid management is concerned. Wound bed itself actually show signs of good improvement although there is some Slough noted there's not as much as previous and he actually has fairly decent granulation noted as well. Overall I'm pleased with the progress of to this point. The patient would prefer not to have any debridement today he states he's actually not feeling too well in general and he really does not want any additional discomfort typically debridement is fairly uncomfortable for him. 05/08/18 on evaluation today patient actually appears to be doing a little better  in regard to his lower extremity ulcer. He has been tolerating the  dressing changes without complication. With that being said I'm actually quite pleased with the fact the wound is not appear to be infected and in general has made a little progress. With that being said I do believe we need to perform some debridement to clean away the necrotic tissue on the surface of the wound. 05/15/18 on evaluation today patient actually appears to be doing a little better in regard to his wound. Fortunately there is some improvement noted fortunately there's also no significant evidence of infection at this point in time. He does have continued pain that really nothing different than previous he did get his Juxta-Lite wrap. 05/29/18 on evaluation today patient actually appears to be doing somewhat better in regard to his wound currently. He's been tolerating the dressing changes without complication. With that being said he has been performing the [facility] Dressing changes every day at home. I'm pretty sure that we told him every other day last week or when I saw him rather two weeks ago. With that being said obviously did not hurt to do it daily just that obviously is gonna run out of supplies sooner and that could get him into trouble as far as his insurance is concerned. Nonetheless he at this point still continues to have pain although honestly I don't feel like it's as bad as what it was in the past just based on his reactions at this point. 06/12/18 on evaluation today patient actually appears to be doing better in regard to his lower [name] ulcer. He's been tolerating the dressing changes without complication. Fortunately there does not appear to be any evidence of infection at this time. No fevers, chills, nausea, or vomiting noted at this time. 06/19/18 evaluation today patient's ulcer on the lower extremity appears to be doing better at this point. he has been tolerating the dressing changes. The patient seems to be somewhat depressed about the progress of his  wound although the overall appearance at this time seems to be doing well. In general I'm very pleased with the appearance today he does have some biofilm on the surface of the wound as well as of hyper granular tissue we may want to utilize a little bit of silver nitrate today. 06/27/2018; patient comes into clinic today for a wound on the right lateral calf likely related to chronic venous insufficiency. He has been using medihoney covered with Hydrofera Blue. Wound surface actually looks quite good 07/03/2018 seen today for follow-up and management of lower extremity ulcer right lateral shin. T olerating dressing changes. He has a small amount of biofilm today. Will attempt to remove a portion of the biofilm as he tolerates. He becomes very anxious with wound treatments. He would benefit from layer compression wraps however he refuses compression wraps or anything that smokes his legs. He expressed an inability to tolerate compression wraps. Juxta lite wraps have been ordered. He has been instructed on the appropriate application of the juxta leg wraps. His blood pressure today on visit was 200/120. He denies any blurred vision, chest pain, dizziness, shortness of breath, or difficulty with mobility. Mr. Lenzen stated that he had a recent visit with his primary care provider. At that time his carvedilol was decreased from 25 mg daily to 12.5 mg daily. Strongly encouraged Mr. Buening to seek medical attention today during visit. He declined transport for evaluation of elevated blood pressure. Encouraged him to contact his primary care  provider today for medication management. He stated that he would call his primary care doctor after wound visit today. Also encouraged him that if he experienced any symptoms of blurred vision, chest pain, shortness of breath to immediately seek medical attention. 07/17/18 on evaluation today patient's wound actually does seem to show signs of improvement based on the  overall appearance of the wound bed today. There does not appear to show any signs of infection which is good news. There is no overall worsening which is also good news. He still has hyper granular tissue will use in the [employer] followed by [facility] Dressing this point. 07/31/18 on evaluation today patient's wound bed actually show signs of improvement with good epithelialization especially in the upper portion of the wound. He has been tolerating the dressing changes without complication in general. Overall I'm extremely happy with how things stand. No fevers, chills, nausea, or vomiting noted at this time. 08/28/18 on evaluation today patient appears to be doing a little better in regard to his lower extremity ulcer. With that being said it appears to be very hyper granular I think this is directly attributed to the fact that he continues to use the [employer] underneath the [facility] Dressing. Although this helps not to stick unfortunately also think he's not getting the benefit of the [facility] Dressing particular and subsequently is causing too much moisture buildup hence the hyper granulation. Fortunately there does not appear to be any signs of infection at this time which is good news. 09/03/18 on evaluation today patient appears to be doing well in regard to his lower [name] ulcer. He has been tolerating the dressing changes without complication. Fortunately he has much less hyper granular tissue the noted last week I do think using silver nitrate in changing to using the [facility] Dressing without the mepitel has made a big difference for him this is good news. 09/18/18 on evaluation today patient appears to be doing excellent in regard to his right lower [name] ulcer. This is actually significantly smaller even compared to the last evaluation. Overall I'm very pleased in this regard. I'm a recommend that we likely repeat the silver nitrate today based on what I'm  seeing. 10/02/18 on evaluation today patient appears to be doing excellent in regard to his lower extremity wound on the right. He's been tolerating the dressing changes without complication. Fortunately there's no signs of infection at this time. Overall been very pleased with how things seem to be progressing. No fevers, chills, nausea, or vomiting noted at this time. 10/16/18 on evaluation today patient actually appears to be doing much better in regard to his lower extremity wound on the right. This is shown signs of good improvement and is indeed measuring smaller he is on a excellent track as far as healing is concerned. My hope is this will be healed of the next several weeks. Fortunately there is no evidence of infection at this time. He does seem to be doing everything that I'm recommending for him 10/30/18 on evaluation today patient appears to be doing better in regard to his lower extremity ulcer. He's been tolerating the dressing changes without complication. Fortunately the Hydrofera Blue Dressing to be doing the job. He has made excellent progress. With that being said this is still going somewhat slow but nonetheless is always making good progress at this point. 5/18-Patient was in to be seen for right leg ulcer that appeared after scab above it was removed today. This area had  healed completely. 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 The wound has not healed though it has been nearly 2 months since the injury occurred. [facility]. At that time it was very painful and he described it as a "15 on a scale of 010". He also was found to have 3+ pitting edema to the  bilateral lower extremities. He was prescribed a weeks course of Keflex. He is here today because the wound has failed to heal and he has a prior history in our clinic. ABI in clinic today was noncompressible. He did have formal vascular studies performed in 2019 which were normal. The last hemoglobin A1c I have available for review was from March 21, 2021 and was elevated at 7.7%. The wound is fairly small and circular located about 3 inches above his right lateral malleolus. It is completely covered with eschar. No surrounding erythema, no odor, no purulent drainage. 01/11/2022: The wound on his right lateral leg is a little bit smaller today. It has reaccumulated some slough and a small bit of eschar. It remains fairly painful. He has another area on his anterior tibia that he will not let me touch but I am concerned that there is a wound forming underneath the surface. 01/18/2022: Unfortunately, his wound is a little bit bigger today. He continues to accumulate slough on the wound surface. It is still quite tender. Electronic Signature(s) Signed: 01/18/2022 1:16:18 PM By: Duanne Guessannon, Zay Yeargan MD FACS Entered By: Duanne Guessannon, Gaylon Melchor on 01/18/2022 13:16:18 -------------------------------------------------------------------------------- Physical Exam Details Patient Name: Date of Service: Anthony HarborGLA DNEY, Anthony [name] 01/18/2022 12:45 PM Medical Record Number: [id number] Patient Account Number: [id number] Date of Birth/Sex: Treating RN: 02-Aug-1956 (66 y.o. [name]) Herrington, Taylor Primary Care Provider: Hoy RegisterNewlin, Enobong Other Clinician: Referring Provider: Treating Provider/Extender: Camillo Flamingannon, Tyan Dy Newlin, Enobong Weeks in Treatment: 2 Constitutional Slightly hypertensive. . . . No acute distress.Marland Kitchen. Respiratory Normal work of breathing on room air.. Notes 01/18/2022: Unfortunately, his wound is a little bit bigger today. He continues to accumulate slough on the wound surface. It is still quite tender. Electronic  Signature(s) Signed: 01/18/2022 1:31:08 PM By: Duanne Guessannon, Daiana Vitiello MD FACS Entered By: Duanne Guessannon, Michiah Mudry on 01/18/2022 13:31:08 -------------------------------------------------------------------------------- Physician Orders Details Patient Name: Date of Service: Anthony HarborGLA DNEY, Anthony [name] 01/18/2022 12:45 PM Medical Record Number: [id number] Patient Account Number: [id number] Date of Birth/Sex: Treating RN: 02-Aug-1956 (66 y.o. [name]) Herrington, Taylor Primary Care Provider: Hoy RegisterNewlin, Enobong Other Clinician: Referring Provider: Treating Provider/Extender: Camillo Flamingannon, Kaylenn Civil Newlin, Enobong Weeks in Treatment: 2 Verbal / Phone Orders: No Diagnosis Coding ICD-10 Coding Code Description 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osed E11.622 Type 2 diabetes mellitus with other skin ulcer E11.9 Type 2 diabetes mellitus without complications I10 Essential (primary) hypertension I87.2 Venous insufficiency (chronic) (peripheral) Follow-up Appointments ppointment in 1 week. - Dr. Lady Garyannon - Room 2 - 6/14 at 3:45 PM Return A Bathing/ Shower/ Hygiene May shower with protection but do not get wound dressing(s) wet. - may purchase a cast protector from [facility] or [facility] Edema Control - Lymphedema / SCD / Other Elevate legs to the level of the heart or above for 30 minutes  daily and/or when sitting, a frequency of: Avoid standing for long periods of time. Patient to wear own compression stockings every day. Exercise regularly Moisturize legs daily. Compression stocking or Garment 20-30 mm/Hg pressure to: - to L left until R leg is healed Wound Treatment Wound #2 - Lower Leg Wound Laterality: Right, Lateral Cleanser: Soap and Water 1 x Per Week/30 Days Discharge Instructions: May shower and wash wound with dial antibacterial soap and water prior to dressing change. Cleanser: Wound Cleanser 1 x Per Week/30 Days Discharge Instructions: Cleanse the wound with wound  cleanser prior to applying a clean dressing using gauze sponges, not tissue or cotton balls. Peri-Wound Care: Sween Lotion (Moisturizing lotion) 1 x Per Week/30 Days Discharge Instructions: Apply moisturizing lotion as directed Prim Dressing: IODOFLEX 0.9% Cadexomer Iodine Pad 4x6 cm 1 x Per Week/30 Days ary Discharge Instructions: Apply to wound bed as instructed Secondary Dressing: Woven Gauze Sponge, Non-Sterile 4x4 in 1 x Per Week/30 Days Discharge Instructions: Apply over primary dressing as directed. Secured With: Coban Self-Adherent Wrap 4x5 (in/yd) 1 x Per Week/30 Days Discharge Instructions: Secure with Coban as directed. Secured With: [employer], 4.5x3.1 (in/yd) 1 x Per Week/30 Days Discharge Instructions: Secure with Kerlix as directed. Secured With: 73M Medipore H Soft Cloth Surgical T ape, 4 x 10 (in/yd) 1 x Per Week/30 Days Discharge Instructions: Secure with tape as directed. Electronic Signature(s) Signed: 01/18/2022 2:37:03 PM By: Duanne Guess MD FACS Entered By: Duanne Guess on 01/18/2022 13:31:58 -------------------------------------------------------------------------------- Problem List Details Patient Name: Date of Service: Anthony Barnes, Anthony [name] 01/18/2022 12:45 PM Medical Record Number: [id number] Patient Account Number: [id number] Date of Birth/Sex: Treating RN: 1956/06/21 (66 y.o. [name] Primary Care Provider: Hoy Register Other Clinician: Referring Provider: Treating Provider/Extender: Camillo Flaming Weeks in Treatment: 2 Active Problems ICD-10 Encounter Code Description Active Date MDM Diagnosis L97.812 Non-pressure chronic ulcer of other part of right lower leg with fat layer 01/02/2022 No Yes exposed E11.622 Type 2 diabetes mellitus with other skin ulcer 01/02/2022 No Yes E11.9 Type 2 diabetes mellitus without complications 01/02/2022 No Yes I10 Essential (primary) hypertension 01/02/2022 No Yes I87.2  Venous insufficiency (chronic) (peripheral) 01/02/2022 No Yes Inactive Problems Resolved Problems Electronic Signature(s) Signed: 01/18/2022 1:15:01 PM By: Duanne Guess MD FACS Entered By: Duanne Guess on 01/18/2022 13:15:00 -------------------------------------------------------------------------------- Progress Note Details Patient Name: Date of Service: Anthony Barnes, Anthony [name] 01/18/2022 12:45 PM Medical Record Number: [id number] Patient Account Number: [id number] Date of Birth/Sex: Treating RN: 1956-05-23 (66 y.o. [name] Primary Care Provider: Hoy Register Other Clinician: Referring Provider: Treating Provider/Extender: Camillo Flaming Weeks in Treatment: 2 Subjective Chief Complaint Information obtained from Patient Right LE Ulcer History of Present Illness (HPI) ADMISSION 03/12/18 This is a 66 year old and it was a type II diabetic reasonably poorly controlled with a recent hemoglobin A1c of 8.2. He was tending to his lawn on 01/15/18 when his weed [name] sent a rock up word hitting him in the right anterior leg. He was seen in his primary M.D. office is same time. An x-ray showed no abnormality. He was given a course of cephalexin. Since then he's been applying peroxide and topical antibiotics to the wound. He does not have a history of chronic wounds however he does have chronic edema in the lower legs. He is complaining of pain in the right leg wound but no real history of claudication. Past medical history includes type 2 diabetes, hypertension, morbid obesity. ABIs in the right  leg were noncompressible 03/19/18 on evaluation today patient appears to be tolerating the wrap in general fairly well. He states he's not having that much pain in regard to the wound itself. With that being said he is having some issues with slough buildup on the surface of the wound the Iodoflex does seem to be helpful in that regard. He is still having discomfort he  wonders if I can send in a prescription for ibuprofen or something of like to help him out. I do believe that the prox end may be of benefit for him. 03/25/18 on evaluation today patient appears to be doing very well in regard to his right lateral lower [name]. He's been tolerating the dressing changes without complication that is the wraps. Nonetheless he does continue to have pain he states is been dreading the possibility of having to have debridement again today. His first debridement experience was not optimal. With that being said he does seem to be showing signs of improvement there does appear to be little bit more granulation he does have a lot of slough that really does need to breeding away. 04/04/18;the patient went for arterial studies that were really quite normal. ABI on the right at 1.19 with triphasic waveforms on the left 1.11 with triphasic waveforms. TBI 0.93 on the right and 0.95 on the left. This suggests T should be able to tolerate even 4 layer compression. He is however complaining of a lot of pain with our wraps I'm wondering whether the pain is simply Iodoflex. I changed him to [employer] I'm still going to try to keep him in 3 layer compression 04/12/18 on evaluation today patient actually appears to be doing rather well in regard to his right lower [name] ulcer. He is making good progress although it is slow still I do believe that the Santyl is much better than the Iodoflex. The good news is the patient seems to be tolerating the dressing changes without complication now that we've gotten good of the Iodoflex which really did burn him quite significantly. Otherwise there's no evidence of infection. 04/17/18 on evaluation today patient actually appears to be showing signs of improvement in regard to the ulcer. Unfortunately he's had somewhat of a rough day due to the fact that he found out that one of his friends suddenly and  unexpectedly passed today. He states he's not sure he's in the frame of mind to allow me to debride the wound at this point. Nonetheless I do feel like he is showing signs of the wound getting better little by little each week. 04/24/18 on evaluation today patient's ulcer actually appears to be showing some signs of improvement albeit slow. He has been tolerating the dressing changes without complication except for the wrap which she states was so tight that he had to remove it it was causing a lot of discomfort. Fortunately there is no evidence of infection at this point. He states he only wore the wrap until about the time he got home last week and that he had to take it off. Nonetheless he does not have any other compression stockings, Juxta-Lite wraps, or anything otherwise to really help at this point as far as compression is concerned. 05/01/18 on evaluation today patient actually appears to be doing fairly well in regard to his lower extremity ulcer. He has been tolerating the dressing changes currently without complication. The wrap does seem to be helping as far as the fluid management is  concerned. Wound bed itself actually show signs of good improvement although there is some Slough noted there's not as much as previous and he actually has fairly decent granulation noted as well. Overall I'm pleased with the progress of to this point. The patient would prefer not to have any debridement today he states he's actually not feeling too well in general and he really does not want any additional discomfort typically debridement is fairly uncomfortable for him. 05/08/18 on evaluation today patient actually appears to be doing a little better in regard to his lower extremity ulcer. He has been tolerating the dressing changes without complication. With that being said I'm actually quite pleased with the fact the wound is not appear to be infected and in general has made a little progress. With that being  said I do believe we need to perform some debridement to clean away the necrotic tissue on the surface of the wound. 05/15/18 on evaluation today patient actually appears to be doing a little better in regard to his wound. Fortunately there is some improvement noted fortunately there's also no significant evidence of infection at this point in time. He does have continued pain that really nothing different than previous he did get his Juxta-Lite wrap. 05/29/18 on evaluation today patient actually appears to be doing somewhat better in regard to his wound currently. He's been tolerating the dressing changes without complication. With that being said he has been performing the [facility] Dressing changes every day at home. I'm pretty sure that we told him every other day last week or when I saw him rather two weeks ago. With that being said obviously did not hurt to do it daily just that obviously is gonna run out of supplies sooner and that could get him into trouble as far as his insurance is concerned. Nonetheless he at this point still continues to have pain although honestly I don't feel like it's as bad as what it was in the past just based on his reactions at this point. 06/12/18 on evaluation today patient actually appears to be doing better in regard to his lower [name] ulcer. He's been tolerating the dressing changes without complication. Fortunately there does not appear to be any evidence of infection at this time. No fevers, chills, nausea, or vomiting noted at this time. 06/19/18 evaluation today patient's ulcer on the lower extremity appears to be doing better at this point. he has been tolerating the dressing changes. The patient seems to be somewhat depressed about the progress of his wound although the overall appearance at this time seems to be doing well. In general I'm very pleased with the appearance today he does have some biofilm on the surface of the wound as well as of hyper  granular tissue we may want to utilize a little bit of silver nitrate today. 06/27/2018; patient comes into clinic today for a wound on the right lateral calf likely related to chronic venous insufficiency. He has been using medihoney covered with Hydrofera Blue. Wound surface actually looks quite good 07/03/2018 seen today for follow-up and management of lower extremity ulcer right lateral shin. T olerating dressing changes. He has a small amount of biofilm today. Will attempt to remove a portion of the biofilm as he tolerates. He becomes very anxious with wound treatments. He would benefit from layer compression wraps however he refuses compression wraps or anything that smokes his legs. He expressed an inability to tolerate compression wraps. Juxta lite wraps have been ordered. He  has been instructed on the appropriate application of the juxta leg wraps. His blood pressure today on visit was 200/120. He denies any blurred vision, chest pain, dizziness, shortness of breath, or difficulty with mobility. Mr. Knupp stated that he had a recent visit with his primary care provider. At that time his carvedilol was decreased from 25 mg daily to 12.5 mg daily. Strongly encouraged Mr. Cowles to seek medical attention today during visit. He declined transport for evaluation of elevated blood pressure. Encouraged him to contact his primary care provider today for medication management. He stated that he would call his primary care doctor after wound visit today. Also encouraged him that if he experienced any symptoms of blurred vision, chest pain, shortness of breath to immediately seek medical attention. 07/17/18 on evaluation today patient's wound actually does seem to show signs of improvement based on the overall appearance of the wound bed today. There does not appear to show any signs of infection which is good news. There is no overall worsening which is also good news. He still has hyper granular  tissue will use in the [employer] followed by [facility] Dressing this point. 07/31/18 on evaluation today patient's wound bed actually show signs of improvement with good epithelialization especially in the upper portion of the wound. He has been tolerating the dressing changes without complication in general. Overall I'm extremely happy with how things stand. No fevers, chills, nausea, or vomiting noted at this time. 08/28/18 on evaluation today patient appears to be doing a little better in regard to his lower extremity ulcer. With that being said it appears to be very hyper granular I think this is directly attributed to the fact that he continues to use the [employer] underneath the [facility] Dressing. Although this helps not to stick unfortunately also think he's not getting the benefit of the [facility] Dressing particular and subsequently is causing too much moisture buildup hence the hyper granulation. Fortunately there does not appear to be any signs of infection at this time which is good news. 09/03/18 on evaluation today patient appears to be doing well in regard to his lower [name] ulcer. He has been tolerating the dressing changes without complication. Fortunately he has much less hyper granular tissue the noted last week I do think using silver nitrate in changing to using the [facility] Dressing without the mepitel has made a big difference for him this is good news. 09/18/18 on evaluation today patient appears to be doing excellent in regard to his right lower [name] ulcer. This is actually significantly smaller even compared to the last evaluation. Overall I'm very pleased in this regard. I'm a recommend that we likely repeat the silver nitrate today based on what I'm seeing. 10/02/18 on evaluation today patient appears to be doing excellent in regard to his lower extremity wound on the right. He's been tolerating the dressing changes without complication. Fortunately  there's no signs of infection at this time. Overall been very pleased with how things seem to be progressing. No fevers, chills, nausea, or vomiting noted at this time. 10/16/18 on evaluation today patient actually appears to be doing much better in regard to his lower extremity wound on the right. This is shown signs of good improvement and is indeed measuring smaller he is on a excellent track as far as healing is concerned. My hope is this will be healed of the next several weeks. Fortunately there is no evidence of infection at this time. He does seem  to be doing everything that I'm recommending for him 10/30/18 on evaluation today patient appears to be doing better in regard to his lower extremity ulcer. He's been tolerating the dressing changes without complication. Fortunately the Hydrofera Blue Dressing to be doing the job. He has made excellent progress. With that being said this is still going somewhat slow but nonetheless is always making good progress at this point. 5/18-Patient was in to be seen for right leg ulcer that appeared after scab above it was removed today. This area had healed completely. 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 The wound has not healed though it has been nearly 2 months since the injury occurred. He was seen in the emergency department at [facility] on May 9. At that time it was very painful and he described it as a "15 on a scale of 0oo10". He also was found to have 3+ pitting edema to the bilateral lower extremities. He was prescribed a weeks course of Keflex. He is here today because the wound has failed to heal and he has a prior history in our clinic. ABI in clinic today was  noncompressible. He did have formal vascular studies performed in 2019 which were normal. The last hemoglobin A1c I have available for review was from March 21, 2021 and was elevated at 7.7%. The wound is fairly small and circular located about 3 inches above his right lateral malleolus. It is completely covered with eschar. No surrounding erythema, no odor, no purulent drainage. 01/11/2022: The wound on his right lateral leg is a little bit smaller today. It has reaccumulated some slough and a small bit of eschar. It remains fairly painful. He has another area on his anterior tibia that he will not let me touch but I am concerned that there is a wound forming underneath the surface. 01/18/2022: Unfortunately, his wound is a little bit bigger today. He continues to accumulate slough on the wound surface. It is still quite tender. Patient History Information obtained from Patient. Family History Cancer - Mother, Diabetes - Mother,Father, Hypertension - Mother,Father, Kidney Disease - Siblings, Stroke - Father, No family history of Heart Disease, Hereditary Spherocytosis, Lung Disease, Seizures, Thyroid Problems, Tuberculosis. Social History Former smoker - ended on 08/14/1990, Marital Status - Married, Alcohol Use - Moderate, Drug Use - No History, Caffeine Use - Daily. Medical History Eyes Denies history of Cataracts, Glaucoma, Optic Neuritis Ear/Nose/Mouth/Throat Denies history of Chronic sinus problems/congestion, Middle ear problems Hematologic/Lymphatic Denies history of Anemia, Hemophilia, Human Immunodeficiency Virus, Lymphedema, Sickle Cell Disease Respiratory Denies history of Aspiration, Asthma, Chronic Obstructive Pulmonary Disease (COPD), Pneumothorax, Sleep Apnea, Tuberculosis Cardiovascular Patient has history of Hypertension Denies history of Angina, Arrhythmia, Congestive Heart Failure, Coronary Artery Disease, Deep Vein Thrombosis, Hypotension, Myocardial Infarction, Peripheral  Arterial Disease, Peripheral Venous Disease, Phlebitis, Vasculitis Gastrointestinal Denies history of Cirrhosis , Colitis, Crohnoos, Hepatitis A, Hepatitis B, Hepatitis C Endocrine Patient has history of Type II Diabetes Genitourinary Denies history of End Stage Renal Disease Immunological Denies history of Lupus Erythematosus, Raynaudoos, Scleroderma Integumentary (Skin) Denies history of History of Burn Musculoskeletal Denies history of Gout, Rheumatoid Arthritis, Osteoarthritis, Osteomyelitis Neurologic Denies history of Dementia, Neuropathy, Quadriplegia, Paraplegia, Seizure Disorder Oncologic Denies history of Received  Chemotherapy, Received Radiation Psychiatric Denies history of Anorexia/bulimia, Confinement Anxiety Hospitalization/Surgery History - esophagogastroduodenoscopy. - brain aneurysm surgery. Medical A Surgical History Notes nd Constitutional Symptoms (General Health) obesity Gastrointestinal diverticulitis Neurologic neuropathy Objective Constitutional Slightly hypertensive. No acute distress.. Vitals Time Taken: 12:55 PM, Height: 75 in, Weight: 310 lbs, BMI: 38.7, Temperature: 97.8 F, Pulse: 71 bpm, Respiratory Rate: 18 breaths/min, Blood Pressure: 146/93 mmHg. Respiratory Normal work of breathing on room air.. General Notes: 01/18/2022: Unfortunately, his wound is a little bit bigger today. He continues to accumulate slough on the wound surface. It is still quite tender. Integumentary (Hair, Skin) Wound #2 status is Open. Original cause of wound was Trauma. The date acquired was: 12/03/2021. The wound has been in treatment 2 weeks. The wound is located on the Right,Lateral Lower Leg. The wound measures 2.7cm length x 1.1cm width x 0.1cm depth; 2.333cm^2 area and 0.233cm^3 volume. There is Fat Layer (Subcutaneous Tissue) exposed. There is no tunneling or undermining noted. There is a medium amount of serosanguineous drainage noted. The wound margin is  distinct with the outline attached to the wound base. There is medium (34-66%) red granulation within the wound bed. There is a medium (34-66%) amount of necrotic tissue within the wound bed including Eschar and Adherent Slough. Assessment Active Problems ICD-10 Non-pressure chronic ulcer of other part of right lower leg with fat layer exposed Type 2 diabetes mellitus with other skin ulcer Type 2 diabetes mellitus without complications Essential (primary) hypertension Venous insufficiency (chronic) (peripheral) Procedures Wound #2 Pre-procedure diagnosis of Wound #2 is a Venous Leg Ulcer located on the Right,Lateral Lower Leg .Severity of Tissue Pre Debridement is: Fat layer exposed. There was a Excisional Skin/Subcutaneous Tissue Debridement with a total area of 2.97 sq cm performed by Duanne Guess, MD. With the following instrument(s): Curette to remove Viable and Non-Viable tissue/material. Material removed includes Subcutaneous Tissue and Slough and after achieving pain control using Lidocaine 4% Topical Solution. No specimens were taken. A time out was conducted at 13:11, prior to the start of the procedure. A Minimum amount of bleeding was controlled with Pressure. The procedure was tolerated well with a pain level of 10 throughout and a pain level of 3 following the procedure. Post Debridement Measurements: 2.7cm length x 1.1cm width x 0.1cm depth; 0.233cm^3 volume. Character of Wound/Ulcer Post Debridement is improved. Severity of Tissue Post Debridement is: Fat layer exposed. Post procedure Diagnosis Wound #2: Same as Pre-Procedure Plan Follow-up Appointments: Return Appointment in 1 week. - Dr. Lady Gary - Room 2 - 6/14 at 3:45 PM Bathing/ Shower/ Hygiene: May shower with protection but do not get wound dressing(s) wet. - may purchase a cast protector from [facility] or [facility] Edema Control - Lymphedema / SCD / Other: Elevate legs to the level of the heart or above for 30 minutes  daily and/or when sitting, a frequency of: Avoid standing for long periods of time. Patient to wear own compression stockings every day. Exercise regularly Moisturize legs daily. Compression stocking or Garment 20-30 mm/Hg pressure to: - to L left until R leg is healed WOUND #2: - Lower Leg Wound Laterality: Right, Lateral Cleanser: Soap and Water 1 x Per Week/30 Days Discharge Instructions: May shower and wash wound with dial antibacterial soap and water prior to dressing change. Cleanser: Wound Cleanser 1 x Per Week/30 Days Discharge Instructions: Cleanse the wound with wound cleanser prior to applying a clean dressing using gauze sponges, not tissue or cotton balls. Peri-Wound Care: Sween Lotion (Moisturizing lotion) 1 x  Per Week/30 Days Discharge Instructions: Apply moisturizing lotion as directed Prim Dressing: IODOFLEX 0.9% Cadexomer Iodine Pad 4x6 cm 1 x Per Week/30 Days ary Discharge Instructions: Apply to wound bed as instructed Secondary Dressing: Woven Gauze Sponge, Non-Sterile 4x4 in 1 x Per Week/30 Days Discharge Instructions: Apply over primary dressing as directed. Secured With: Coban Self-Adherent Wrap 4x5 (in/yd) 1 x Per Week/30 Days Discharge Instructions: Secure with Coban as directed. Secured With: [employer], 4.5x3.1 (in/yd) 1 x Per Week/30 Days Discharge Instructions: Secure with Kerlix as directed. Secured With: 54M Medipore H Soft Cloth Surgical T ape, 4 x 10 (in/yd) 1 x Per Week/30 Days Discharge Instructions: Secure with tape as directed. 01/18/2022: Unfortunately, his wound is a little bit bigger today. He continues to accumulate slough on the wound surface. It is still quite tender. I used a curette to debride slough from the wound bed. Debridement was limited by patient tolerance secondary to pain. We will continue with Iodoflex for now, but if the wound is not cleaner next week, we may need to change to an alternative agent. I will see him  then. Electronic Signature(s) Signed: 01/18/2022 1:32:52 PM By: Duanne Guess MD FACS Entered By: Duanne Guess on 01/18/2022 13:32:51 -------------------------------------------------------------------------------- HxROS Details Patient Name: Date of Service: Anthony Barnes, Anthony [name] 01/18/2022 12:45 PM Medical Record Number: [id number] Patient Account Number: [id number] Date of Birth/Sex: Treating RN: September 20, 1955 (66 y.o. [name] Primary Care Provider: Hoy Register Other Clinician: Referring Provider: Treating Provider/Extender: Camillo Flaming Weeks in Treatment: 2 Information Obtained From Patient Constitutional Symptoms (General Health) Medical History: Past Medical History Notes: obesity Eyes Medical History: Negative for: Cataracts; Glaucoma; Optic Neuritis Ear/Nose/Mouth/Throat Medical History: Negative for: Chronic sinus problems/congestion; Middle ear problems Hematologic/Lymphatic Medical History: Negative for: Anemia; Hemophilia; Human Immunodeficiency Virus; Lymphedema; Sickle Cell Disease Respiratory Medical History: Negative for: Aspiration; Asthma; Chronic Obstructive Pulmonary Disease (COPD); Pneumothorax; Sleep Apnea; Tuberculosis Cardiovascular Medical History: Positive for: Hypertension Negative for: Angina; Arrhythmia; Congestive Heart Failure; Coronary Artery Disease; Deep Vein Thrombosis; Hypotension; Myocardial Infarction; Peripheral Arterial Disease; Peripheral Venous Disease; Phlebitis; Vasculitis Gastrointestinal Medical History: Negative for: Cirrhosis ; Colitis; Crohns; Hepatitis A; Hepatitis B; Hepatitis C Past Medical History Notes: diverticulitis Endocrine Medical History: Positive for: Type II Diabetes Time with diabetes: 8 years Treated with: Oral agents Blood sugar tested every day: No Genitourinary Medical History: Negative for: End Stage Renal Disease Immunological Medical History: Negative for:  Lupus Erythematosus; Raynauds; Scleroderma Integumentary (Skin) Medical History: Negative for: History of Burn Musculoskeletal Medical History: Negative for: Gout; Rheumatoid Arthritis; Osteoarthritis; Osteomyelitis Neurologic Medical History: Negative for: Dementia; Neuropathy; Quadriplegia; Paraplegia; Seizure Disorder Past Medical History Notes: neuropathy Oncologic Medical History: Negative for: Received Chemotherapy; Received Radiation Psychiatric Medical History: Negative for: Anorexia/bulimia; Confinement Anxiety Immunizations Pneumococcal Vaccine: Received Pneumococcal Vaccination: No Immunization Notes: tetanus 2 years ago Implantable Devices None Hospitalization / Surgery History Type of Hospitalization/Surgery esophagogastroduodenoscopy brain aneurysm surgery Family and Social History Cancer: Yes - Mother; Diabetes: Yes - Mother,Father; Heart Disease: No; Hereditary Spherocytosis: No; Hypertension: Yes - Mother,Father; Kidney Disease: Yes - Siblings; Lung Disease: No; Seizures: No; Stroke: Yes - Father; Thyroid Problems: No; Tuberculosis: No; Former smoker - ended on 08/14/1990; Marital Status - Married; Alcohol Use: Moderate; Drug Use: No History; Caffeine Use: Daily; Financial Concerns: No; Food, Clothing or Shelter Needs: No; Support System Lacking: No; Transportation Concerns: No Electronic Signature(s) Signed: 01/18/2022 2:37:03 PM By: Duanne Guess MD FACS Signed: 01/18/2022 4:55:42 PM By: Samuella Bruin Entered By: Duanne Guess  on 01/18/2022 13:16:26 -------------------------------------------------------------------------------- SuperBill Details Patient Name: Date of Service: [name] 01/18/2022 Medical Record Number: [id number] Patient Account Number: [id number] Date of Birth/Sex: Treating RN: 1955/11/04 (66 y.o. [name] Primary Care Provider: Hoy Register Other Clinician: Referring Provider: Treating  Provider/Extender: Camillo Flaming Weeks in Treatment: 2 Diagnosis Coding ICD-10 Codes Code Description [phone] Non-pressure chronic ulcer of other part of right lower leg with fat layer exposed E11.622 Type 2 diabetes mellitus with other skin ulcer E11.9 Type 2 diabetes mellitus without complications I10 Essential (primary) hypertension I87.2 Venous insufficiency (chronic) (peripheral) Facility Procedures CPT4 Code: [id number] 1 Description: 1042 - DEB SUBQ TISSUE 20 SQ CM/< ICD-10 Diagnosis Description 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 Modifier: osed Quantity: 1 Physician Procedures : CPT4 Code Description Modifier [id number] - WC PHYS LEVEL 3 - EST PT 25 ICD-10 Diagnosis Description 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osed E11.622 Type 2 diabetes mellitus with other skin ulcer  I87.2 Venous insufficiency (chronic) (peripheral) I10 Essential (primary) hypertension Quantity: 1 : [id number] 11042 - WC PHYS SUBQ TISS 20 SQ CM ICD-10 Diagnosis Description 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osed Quantity: 1 Electronic Signature(s) Signed: 01/18/2022 1:34:07 PM By: Duanne Guess MD FACS Entered By: Duanne Guess on 01/18/2022 13:34:07

## 2022-01-19 NOTE — Telephone Encounter (Signed)
Noted  

## 2022-01-20 ENCOUNTER — Telehealth (HOSPITAL_BASED_OUTPATIENT_CLINIC_OR_DEPARTMENT_OTHER): Payer: Self-pay | Admitting: *Deleted

## 2022-01-20 NOTE — Telephone Encounter (Signed)
Called to reschedule the lower arterial --ABI/TBI doppler ordered by Duanne Guess, MD----no answer and mail box is full kf

## 2022-01-22 ENCOUNTER — Ambulatory Visit (HOSPITAL_COMMUNITY)
Admission: EM | Admit: 2022-01-22 | Discharge: 2022-01-22 | Disposition: A | Discharge status: Home / Self Care | Payer: Medicare Other | Attending: Emergency Medicine | Admitting: Emergency Medicine

## 2022-01-22 DIAGNOSIS — S81801D Unspecified open wound, right lower leg, subsequent encounter: Secondary | ICD-10-CM

## 2022-01-22 MED ORDER — TRAMADOL HCL 50 MG PO TABS: 50.0000 mg | ORAL_TABLET | Freq: Four times a day (QID) | ORAL | 0 refills | Status: DC | PRN

## 2022-01-22 NOTE — ED Triage Notes (Signed)
Reports that has wound on RLE that seen at wound center for. Today while at congregation had bleeding come out of bandage onto sock. Pt reports hasnt taken sock or bandage from wound care off.

## 2022-01-22 NOTE — ED Provider Notes (Addendum)
[facility]    CSN: [id number] Arrival date & time: 01/22/22  1538      History   Chief Complaint Chief Complaint  Patient presents with   Wound Check    HPI Anthony Barnes is a 66 y.o. male.   Patient presents requesting wound check and dressing change.  Patient has a wound to the right lower extremity that began after a rock flew off of a weed [employer].  Endorses that he was at church today in a meeting when he noticed blood surrounding the dressing and seeping into his sock.  Denies injury or trauma to the area.  Has not attempted treatment.  He is currently followed by the wound care center and had debridement on 01/18/2022.  Patient is not required to do dressing changes at home and all care is managed by the wound center.  History of diabetes mellitus hypertension  Past Medical History:  Diagnosis Date   Diabetes mellitus without complication ([place])    Hypertension     Patient Active Problem List   Diagnosis Date Noted   Hyperlipidemia 06/25/2018   Morbid obesity ([place]) 03/13/2016   Dental caries 09/09/2015   Diabetes ([place]) 10/20/2013   DM (diabetes mellitus) type 2, uncontrolled, with ketoacidosis ([place]) 04/01/2013   Essential hypertension, benign 04/01/2013   GERD (gastroesophageal reflux disease) 04/01/2013   Erosive gastritis 03/22/2013   DM2 (diabetes mellitus, type 2) ([place]) 03/21/2013   Unspecified essential hypertension 03/21/2013   Hyponatremia 03/21/2013   Epigastric pain 03/20/2013   HTN (hypertension) 03/20/2013    Past Surgical History:  Procedure Laterality Date   BRAIN SURGERY     ESOPHAGOGASTRODUODENOSCOPY N/A 03/22/2013   Procedure: ESOPHAGOGASTRODUODENOSCOPY (EGD);  Surgeon: Wonda Horner, MD;  Location: [facility] ENDOSCOPY;  Service: Endoscopy;  Laterality: N/A;       Home Medications    Prior to Admission medications   Medication Sig Start Date End Date Taking? Authorizing Provider  traMADol (ULTRAM) 50 MG tablet Take 1 tablet (50 mg  total) by mouth every 6 (six) hours as needed. 01/22/22  Yes Tamira Ryland, Leitha Schuller, NP  acetaminophen (TYLENOL) 500 MG tablet Take 1 tablet (500 mg total) by mouth every 6 (six) hours as needed. 12/20/21   Talbot Grumbling, FNP  amLODipine (NORVASC) 10 MG tablet Take 1 tablet (10 mg total) by mouth daily. 10/20/21   Charlott Rakes, MD  atorvastatin (LIPITOR) 40 MG tablet Take 1 tablet (40 mg total) by mouth daily. 03/21/21   Charlott Rakes, MD  bacitracin ointment Apply 1 application topically 2 (two) times daily. 04/29/21   Pearson Forster, NP  Blood Glucose Monitoring Suppl (TRUE METRIX METER) w/Device KIT Check blood sugar 3 times daily. 12/18/19   Charlott Rakes, MD  carvedilol (COREG) 25 MG tablet Take 1 tablet (25 mg total) by mouth 2 (two) times daily with a meal. 03/21/21   Charlott Rakes, MD  cephALEXin (KEFLEX) 500 MG capsule Take 1 capsule (500 mg total) by mouth 3 (three) times daily. 12/20/21   Talbot Grumbling, FNP  cloNIDine (CATAPRES) 0.3 MG tablet Take 1 tablet (0.3 mg total) by mouth 2 (two) times daily. 10/20/21   Charlott Rakes, MD  furosemide (LASIX) 20 MG tablet Take 1 tablet (20 mg total) by mouth daily for 3 days. 12/20/21 12/23/21  Talbot Grumbling, FNP  glipiZIDE (GLUCOTROL) 10 MG tablet TAKE ONE TABLET BY MOUTH TWICE DAILY BEFORE MEALS 10/20/21   Charlott Rakes, MD  glucose blood (TRUE METRIX BLOOD GLUCOSE TEST)  test strip CHECK BLOOD SUGAR 3 TIMES DAILY. 02/22/21 02/21/22  Charlott Rakes, MD  ibuprofen (ADVIL,MOTRIN) 800 MG tablet Take 1 tablet (800 mg total) by mouth every 12 (twelve) hours as needed. 08/01/18   Charlott Rakes, MD  lisinopril-hydrochlorothiazide (ZESTORETIC) 20-12.5 MG tablet TAKE TWO TABLETS BY MOUTH DAILY 11/09/21   Charlott Rakes, MD  Multiple Vitamin (MULTIVITAMIN WITH MINERALS) TABS tablet Take 1 tablet by mouth daily.    [provider]  omega-3 acid ethyl esters (LOVAZA) 1 g capsule Take 1 g by mouth 2 (two) times daily.    [provider]  potassium chloride (KLOR-CON) 10 MEQ tablet Take 1 tablet (10 mEq total) by mouth daily. 12/20/21   Talbot Grumbling, FNP  TRUEplus Lancets 28G MISC CHECK BLOOD SUGAR 3 TIMES DAILY. 02/22/21   Charlott Rakes, MD    Family History Family History  Problem Relation Age of Onset   Hypertension Mother    Cancer Mother 71       ovarian   Diabetes Father    Stroke Father     Social History Social History   Tobacco Use   Smoking status: Former    Packs/day: 1.00    Years: 16.00    Total pack years: 16.00    Types: Cigarettes    Start date: 08/14/1974    Quit date: 08/14/1990    Years since quitting: 31.4   Smokeless tobacco: Former    Quit date: 08/14/1990  Vaping Use   Vaping Use: Never used  Substance Use Topics   Alcohol use: Yes    Alcohol/week: 4.0 standard drinks of alcohol    Types: 2 Cans of beer, 2 Shots of liquor per week    Comment: occas   Drug use: No     Allergies   Shellfish allergy and Codeine   Review of Systems Review of Systems  Constitutional: Negative.   Respiratory: Negative.    Cardiovascular: Negative.   Skin:  Positive for wound. Negative for color change, pallor and rash.  Neurological: Negative.      Physical Exam Triage Vital Signs ED Triage Vitals  Enc Vitals Group     BP 01/22/22 1557 (!) 156/107     Pulse Rate 01/22/22 1557 84     Resp 01/22/22 1557 18     Temp 01/22/22 1557 98.3 F (36.8 C)     Temp Source 01/22/22 1557 Oral     SpO2 01/22/22 1557 98 %     Weight --      Height --      Head Circumference --      Peak Flow --      Pain Score 01/22/22 1555 0     Pain Loc --      Pain Edu? --      Excl. in [place]? --    No data found.  Updated Vital Signs BP (!) 156/107 (BP Location: Left Arm)   Pulse 84   Temp 98.3 F (36.8 C) (Oral)   Resp 18   SpO2 98%   Visual Acuity Right Eye Distance:   Left Eye Distance:   Bilateral Distance:    Right Eye Near:   Left Eye Near:    Bilateral Near:      Physical Exam Constitutional:      Appearance: Normal appearance.  HENT:     Head: Normocephalic.  Eyes:     Extraocular Movements: Extraocular movements intact.  Pulmonary:     Effort: Pulmonary effort is normal.  Skin:    Comments: Approximately a 2 x 1 cm wound present to the lateral aspect of the right lower extremity, yellow eschar noted at the wound bed, bleeding has subsided and dried blood is present along the lower extremity and ankle  Neurological:     Mental Status: He is alert and oriented to person, place, and time. Mental status is at baseline.  Psychiatric:        Mood and Affect: Mood normal.        Behavior: Behavior normal.      UC Treatments / Results  Labs (all labs ordered are listed, but only abnormal results are displayed) Labs Reviewed - No data to display  EKG   Radiology No results found.  Procedures Procedures (including critical care time)  Medications Ordered in UC Medications - No data to display  Initial Impression / Assessment and Plan / UC Course  I have reviewed the triage vital signs and the nursing notes.  Pertinent labs & imaging results that were available during my care of the patient were reviewed by me and considered in my medical decision making (see chart for details).  Wound of right leg, subsequent encounter  Gauze stuck into wound bed, use hydrogen peroxide for removal, bleeding has subsided at site, cleansed sterile water, nonadherent dressing applied and wrapped with gauze and Coban, patient to notify wound care center for further evaluation and management of site, patient endorses that debridement has been painful and that while topical numbing agent is somewhat helpful it is not enough, tramadol prescribed for outpatient management to be used prior to treatments, PDMP reviewed, low risk  Final Clinical Impressions(s) / UC Diagnoses   Final diagnoses:  Wound of right leg, initial encounter     Discharge  Instructions      Your wound has been cleansed and redressed with a nonadherent dressing, gauze and Coban  If you find the gauze has become stuck in the wound bed at home you may apply hydrogen peroxide to break up the clotting then gently remove, cleans with diluted soap and water, pat dry and recover  You may use tramadol tablet prior to debridement to help manage pain, take 1 hour before appointment, be mindful of this as a potential to make you drowsy  Notify the wound center tomorrow morning so that they may properly redressed the area     ED Prescriptions     Medication Sig Dispense Auth. Provider   traMADol (ULTRAM) 50 MG tablet Take 1 tablet (50 mg total) by mouth every 6 (six) hours as needed. 15 tablet Lety Cullens, Leitha Schuller, NP      I have reviewed the PDMP during this encounter.   Hans Eden, NP 01/22/22 1705    Hans Eden, NP 01/22/22 1706

## 2022-01-22 NOTE — Discharge Instructions (Addendum)
Your wound has been cleansed and redressed with a nonadherent dressing, gauze and Coban  If you find the gauze has become stuck in the wound bed at home you may apply hydrogen peroxide to break up the clotting then gently remove, cleans with diluted soap and water, pat dry and recover  You may use tramadol tablet prior to debridement to help manage pain, take 1 hour before appointment, be mindful of this as a potential to make you drowsy  Notify the wound center tomorrow morning so that they may properly redressed the area

## 2022-01-23 NOTE — Telephone Encounter (Signed)
Called to discuss rescheduling the Lower arterial doppler ---no answer and voice mail is full

## 2022-01-24 ENCOUNTER — Ambulatory Visit: Payer: Medicare Other | Attending: Family Medicine | Admitting: Family Medicine

## 2022-01-24 ENCOUNTER — Encounter: Payer: Self-pay | Admitting: Family Medicine

## 2022-01-24 VITALS — BP 155/91 | HR 70 | Temp 98.4°F | Ht 76.0 in | Wt 309.0 lb

## 2022-01-24 DIAGNOSIS — E1149 Type 2 diabetes mellitus with other diabetic neurological complication: Secondary | ICD-10-CM

## 2022-01-24 DIAGNOSIS — Z79899 Other long term (current) drug therapy: Secondary | ICD-10-CM | POA: Insufficient documentation

## 2022-01-24 DIAGNOSIS — Y93H2 Activity, gardening and landscaping: Secondary | ICD-10-CM | POA: Diagnosis not present

## 2022-01-24 DIAGNOSIS — L97909 Non-pressure chronic ulcer of unspecified part of unspecified lower leg with unspecified severity: Secondary | ICD-10-CM | POA: Diagnosis not present

## 2022-01-24 DIAGNOSIS — I10 Essential (primary) hypertension: Secondary | ICD-10-CM | POA: Insufficient documentation

## 2022-01-24 DIAGNOSIS — L97919 Non-pressure chronic ulcer of unspecified part of right lower leg with unspecified severity: Secondary | ICD-10-CM | POA: Insufficient documentation

## 2022-01-24 DIAGNOSIS — Z7901 Long term (current) use of anticoagulants: Secondary | ICD-10-CM | POA: Diagnosis not present

## 2022-01-24 DIAGNOSIS — E785 Hyperlipidemia, unspecified: Secondary | ICD-10-CM | POA: Insufficient documentation

## 2022-01-24 DIAGNOSIS — E11622 Type 2 diabetes mellitus with other skin ulcer: Secondary | ICD-10-CM | POA: Insufficient documentation

## 2022-01-24 DIAGNOSIS — Z7984 Long term (current) use of oral hypoglycemic drugs: Secondary | ICD-10-CM | POA: Insufficient documentation

## 2022-01-24 DIAGNOSIS — Y929 Unspecified place or not applicable: Secondary | ICD-10-CM | POA: Diagnosis not present

## 2022-01-24 DIAGNOSIS — W2209XA Striking against other stationary object, initial encounter: Secondary | ICD-10-CM | POA: Insufficient documentation

## 2022-01-24 DIAGNOSIS — E1159 Type 2 diabetes mellitus with other circulatory complications: Secondary | ICD-10-CM | POA: Diagnosis not present

## 2022-01-24 DIAGNOSIS — E1169 Type 2 diabetes mellitus with other specified complication: Secondary | ICD-10-CM | POA: Diagnosis not present

## 2022-01-24 DIAGNOSIS — Z6837 Body mass index (BMI) 37.0-37.9, adult: Secondary | ICD-10-CM | POA: Diagnosis not present

## 2022-01-24 DIAGNOSIS — I152 Hypertension secondary to endocrine disorders: Secondary | ICD-10-CM | POA: Diagnosis not present

## 2022-01-24 LAB — GLUCOSE, POCT (MANUAL RESULT ENTRY): POC Glucose: 165 mg/dl — AB (ref 70–99)

## 2022-01-24 LAB — POCT GLYCOSYLATED HEMOGLOBIN (HGB A1C): HbA1c, POC (controlled diabetic range): 7.6 % — AB (ref 0.0–7.0)

## 2022-01-24 MED ORDER — GABAPENTIN 300 MG PO CAPS: 300.0000 mg | ORAL_CAPSULE | Freq: Every day | ORAL | 1 refills | Status: AC

## 2022-01-24 MED ORDER — SPIRONOLACTONE 25 MG PO TABS: 25.0000 mg | ORAL_TABLET | Freq: Every day | ORAL | 1 refills | Status: DC

## 2022-01-24 MED ORDER — GLIPIZIDE 10 MG PO TABS: ORAL_TABLET | ORAL | 1 refills | Status: DC

## 2022-01-24 MED ORDER — CARVEDILOL 25 MG PO TABS: 25.0000 mg | ORAL_TABLET | Freq: Two times a day (BID) | ORAL | 1 refills | Status: DC

## 2022-01-24 MED ORDER — LISINOPRIL-HYDROCHLOROTHIAZIDE 20-12.5 MG PO TABS: 2.0000 | ORAL_TABLET | Freq: Every day | ORAL | 1 refills | Status: DC

## 2022-01-24 MED ORDER — CLONIDINE HCL 0.3 MG PO TABS: 0.3000 mg | ORAL_TABLET | Freq: Two times a day (BID) | ORAL | 1 refills | Status: DC

## 2022-01-24 MED ORDER — AMLODIPINE BESYLATE 10 MG PO TABS: 10.0000 mg | ORAL_TABLET | Freq: Every day | ORAL | 1 refills | Status: DC

## 2022-01-24 MED ORDER — ATORVASTATIN CALCIUM 40 MG PO TABS: 40.0000 mg | ORAL_TABLET | Freq: Every day | ORAL | 1 refills | Status: DC

## 2022-01-24 NOTE — Progress Notes (Signed)
Handicap sticker request.

## 2022-01-24 NOTE — Progress Notes (Signed)
Subjective:  Patient ID: Anthony Barnes, male    DOB: Dec 29, 1955  Age: 66 y.o. MRN: [id number]  CC: Diabetes   HPI Anthony Barnes is a 66 y.o. year old male with a history of hypertension, type 2 diabetes mellitus A1c ( A1c  7.6), morbid obesity here for follow-up visit.    Interval History: He does have a RLE ulcer which he sustained after a weed eater hit a rock against his leg. He is currently being managed by wound care for right leg wound for which he has undergone debridement.  He has throbbing especially at night and after debridement. He did have an ED visit 2 days ago when he had noticed wound to be bleeding and a new dressing was applied to it.  He has an upcoming appointment with wound care tomorrow.  His BP is elevated and review of his medication list reveals he is not taking spironolactone even though this was initiated at his last visit.  He endorses taking his other antihypertensive. A1c is 7.6 and he is on his current regimen of metformin and glipizide.  Declined initiation of injectable in the past. Past Medical History:  Diagnosis Date   Diabetes mellitus without complication ([place])    Hypertension     Past Surgical History:  Procedure Laterality Date   BRAIN SURGERY     ESOPHAGOGASTRODUODENOSCOPY N/A 03/22/2013   Procedure: ESOPHAGOGASTRODUODENOSCOPY (EGD);  Surgeon: Wonda Horner, MD;  Location: [facility] ENDOSCOPY;  Service: Endoscopy;  Laterality: N/A;    Family History  Problem Relation Age of Onset   Hypertension Mother    Cancer Mother 47       ovarian   Diabetes Father    Stroke Father     Social History   Socioeconomic History   Marital status: Married    Spouse name: Not on file   Number of children: Not on file   Years of education: Not on file   Highest education level: Not on file  Occupational History   Not on file  Tobacco Use   Smoking status: Former    Packs/day: 1.00    Years: 16.00    Total pack years: 16.00    Types:  Cigarettes    Start date: 08/14/1974    Quit date: 08/14/1990    Years since quitting: 31.4   Smokeless tobacco: Former    Quit date: 08/14/1990  Vaping Use   Vaping Use: Never used  Substance and Sexual Activity   Alcohol use: Yes    Alcohol/week: 4.0 standard drinks of alcohol    Types: 2 Cans of beer, 2 Shots of liquor per week    Comment: occas   Drug use: No   Sexual activity: Yes  Other Topics Concern   Not on file  Social History Narrative   Not on file   Social Determinants of Health   Financial Resource Strain: Not on file  Food Insecurity: Not on file  Transportation Needs: Not on file  Physical Activity: Not on file  Stress: Not on file  Social Connections: Not on file    Allergies  Allergen Reactions   Shellfish Allergy Anaphylaxis   Codeine Itching    unknown    Outpatient Medications Prior to Visit  Medication Sig Dispense Refill   bacitracin ointment Apply 1 application topically 2 (two) times daily. 120 g 0   Blood Glucose Monitoring Suppl (TRUE METRIX METER) w/Device KIT Check blood sugar 3 times daily. 1 kit 0   glucose  blood (TRUE METRIX BLOOD GLUCOSE TEST) test strip CHECK BLOOD SUGAR 3 TIMES DAILY. 100 strip 2   ibuprofen (ADVIL,MOTRIN) 800 MG tablet Take 1 tablet (800 mg total) by mouth every 12 (twelve) hours as needed. 30 tablet 0   Multiple Vitamin (MULTIVITAMIN WITH MINERALS) TABS tablet Take 1 tablet by mouth daily.     omega-3 acid ethyl esters (LOVAZA) 1 g capsule Take 1 g by mouth 2 (two) times daily.     traMADol (ULTRAM) 50 MG tablet Take 1 tablet (50 mg total) by mouth every 6 (six) hours as needed. 15 tablet 0   TRUEplus Lancets 28G MISC CHECK BLOOD SUGAR 3 TIMES DAILY. 100 each 2   amLODipine (NORVASC) 10 MG tablet Take 1 tablet (10 mg total) by mouth daily. 90 tablet 0   atorvastatin (LIPITOR) 40 MG tablet Take 1 tablet (40 mg total) by mouth daily. 90 tablet 1   carvedilol (COREG) 25 MG tablet Take 1 tablet (25 mg total) by mouth 2 (two)  times daily with a meal. 180 tablet 1   cloNIDine (CATAPRES) 0.3 MG tablet Take 1 tablet (0.3 mg total) by mouth 2 (two) times daily. 180 tablet 0   glipiZIDE (GLUCOTROL) 10 MG tablet TAKE ONE TABLET BY MOUTH TWICE DAILY BEFORE MEALS 180 tablet 0   lisinopril-hydrochlorothiazide (ZESTORETIC) 20-12.5 MG tablet TAKE TWO TABLETS BY MOUTH DAILY 180 tablet 0   acetaminophen (TYLENOL) 500 MG tablet Take 1 tablet (500 mg total) by mouth every 6 (six) hours as needed. 30 tablet 0   cephALEXin (KEFLEX) 500 MG capsule Take 1 capsule (500 mg total) by mouth 3 (three) times daily. 20 capsule 0   furosemide (LASIX) 20 MG tablet Take 1 tablet (20 mg total) by mouth daily for 3 days. 3 tablet 0   potassium chloride (KLOR-CON) 10 MEQ tablet Take 1 tablet (10 mEq total) by mouth daily. 3 tablet 0   No facility-administered medications prior to visit.     ROS Review of Systems  Constitutional:  Negative for activity change and appetite change.  HENT:  Negative for sinus pressure and sore throat.   Eyes:  Negative for visual disturbance.  Respiratory:  Negative for cough, chest tightness and shortness of breath.   Cardiovascular:  Negative for chest pain and leg swelling.  Gastrointestinal:  Negative for abdominal distention, abdominal pain, constipation and diarrhea.  Endocrine: Negative.   Genitourinary:  Negative for dysuria.  Musculoskeletal:  Negative for joint swelling and myalgias.  Skin:  Positive for wound. Negative for rash.  Allergic/Immunologic: Negative.   Neurological:  Negative for weakness, light-headedness and numbness.  Psychiatric/Behavioral:  Negative for dysphoric mood and suicidal ideas.     Objective:  BP (!) 155/91   Pulse 70   Temp 98.4 F (36.9 C) (Oral)   Ht 6' 4"  (1.93 m)   Wt (!) 309 lb (140.2 kg)   SpO2 98%   BMI 37.61 kg/m      01/24/2022    2:23 PM 01/22/2022    3:57 PM 12/20/2021   11:55 AM  BP/Weight  Systolic BP 884 166 063  Diastolic BP 91 016 91  Wt.  (Lbs) 309    BMI 37.61 kg/m2        Physical Exam Constitutional:      Appearance: He is well-developed.  Cardiovascular:     Rate and Rhythm: Normal rate.     Heart sounds: Normal heart sounds. No murmur heard. Pulmonary:     Effort: Pulmonary effort is  normal.     Breath sounds: Normal breath sounds. No wheezing or rales.  Chest:     Chest wall: No tenderness.  Abdominal:     General: Bowel sounds are normal. There is no distension.     Palpations: Abdomen is soft. There is no mass.     Tenderness: There is no abdominal tenderness.  Musculoskeletal:        General: Normal range of motion.     Right lower leg: No edema.     Left lower leg: No edema.     Comments: Right lower leg wrapped in ace wrap  Neurological:     Mental Status: He is alert and oriented to person, place, and time.  Psychiatric:        Mood and Affect: Mood normal.        Latest Ref Rng & Units 12/20/2021    2:13 PM 07/20/2020    9:03 AM 12/16/2019    3:30 PM  CMP  Glucose 70 - 99 mg/dL 163  159  225   BUN 8 - 23 mg/dL 15  16  14    Creatinine 0.61 - 1.24 mg/dL 1.01  0.98  1.07   Sodium 135 - 145 mmol/L 134  138  136   Potassium 3.5 - 5.1 mmol/L 3.5  4.5  4.5   Chloride 98 - 111 mmol/L 101  101  97   CO2 22 - 32 mmol/L 25  24  25    Calcium 8.9 - 10.3 mg/dL 9.2  9.7  9.7   Total Protein 6.0 - 8.5 g/dL  7.4    Total Bilirubin 0.0 - 1.2 mg/dL  0.4    Alkaline Phos 44 - 121 IU/L  46    AST 0 - 40 IU/L  16    ALT 0 - 44 IU/L  20      Lipid Panel     Component Value Date/Time   CHOL 156 07/20/2020 0903   TRIG 48 07/20/2020 0903   HDL 49 07/20/2020 0903   CHOLHDL 3.2 07/20/2020 0903   CHOLHDL 3.2 07/21/2016 1051   VLDL 10 07/21/2016 1051   LDLCALC 97 07/20/2020 0903    CBC    Component Value Date/Time   WBC 8.1 11/25/2017 0407   RBC 4.82 11/25/2017 0407   HGB 13.5 11/25/2017 0407   HCT 39.1 11/25/2017 0407   PLT 211 11/25/2017 0407   MCV 81.1 11/25/2017 0407   MCH 28.0 11/25/2017 0407    MCHC 34.5 11/25/2017 0407   RDW 15.4 11/25/2017 0407   LYMPHSABS 1.8 10/20/2013 1250   MONOABS 0.7 10/20/2013 1250   EOSABS 0.1 10/20/2013 1250   BASOSABS 0.0 10/20/2013 1250    Lab Results  Component Value Date   HGBA1C 7.6 (A) 01/24/2022    Assessment & Plan:  1. Type 2 diabetes mellitus with other neurologic complication, without long-term current use of insulin (HCC) Stable with A1c of 7.6 Would love to place on a GLP-1 RA due to cardiovascular and weight benefits however he is not open to an injectable Counseled on Diabetic diet, my plate method, 025 minutes of moderate intensity exercise/week Blood sugar logs with fasting goals of 80-120 mg/dl, random of less than 180 and in the event of sugars less than 60 mg/dl or greater than 400 mg/dl encouraged to notify the clinic. Advised on the need for annual eye exams, annual foot exams, Pneumonia vaccine. - POCT glucose (manual entry) - POCT glycosylated hemoglobin (Hb A1C) - Microalbumin/Creatinine Ratio,  Urine; Future - glipiZIDE (GLUCOTROL) 10 MG tablet; TAKE ONE TABLET BY MOUTH TWICE DAILY BEFORE MEALS  Dispense: 180 tablet; Refill: 1  2. Hypertension associated with diabetes ([place]) Uncontrolled Placed on spironolactone Counseled on blood pressure goal of less than 130/80, low-sodium, DASH diet, medication compliance, 150 minutes of moderate intensity exercise per week. Discussed medication compliance, adverse effects. - spironolactone (ALDACTONE) 25 MG tablet; Take 1 tablet (25 mg total) by mouth daily.  Dispense: 90 tablet; Refill: 1 - amLODipine (NORVASC) 10 MG tablet; Take 1 tablet (10 mg total) by mouth daily.  Dispense: 90 tablet; Refill: 1 - carvedilol (COREG) 25 MG tablet; Take 1 tablet (25 mg total) by mouth 2 (two) times daily with a meal.  Dispense: 180 tablet; Refill: 1 - cloNIDine (CATAPRES) 0.3 MG tablet; Take 1 tablet (0.3 mg total) by mouth 2 (two) times daily.  Dispense: 180 tablet; Refill: 1 -  lisinopril-hydrochlorothiazide (ZESTORETIC) 20-12.5 MG tablet; Take 2 tablets by mouth daily.  Dispense: 180 tablet; Refill: 1  3. Hyperlipidemia associated with type 2 diabetes mellitus ([place]) Controlled but he is due for lipid panel We will order labs which he will return for next week Monday Low-cholesterol diet - LP+Non-HDL Cholesterol; Future - atorvastatin (LIPITOR) 40 MG tablet; Take 1 tablet (40 mg total) by mouth daily.  Dispense: 90 tablet; Refill: 1  4. Diabetic ulcer of lower leg ([place]) He likely has underlying diabetic neuropathy We will initiate gabapentin We have discussed skin care and the need for protective leg coverings when he is cutting grass as this is his second skin ulcer on his right leg. Keep appointment with wound care tomorrow He does have tramadol prescription to be used prior to debridement - gabapentin (NEURONTIN) 300 MG capsule; Take 1 capsule (300 mg total) by mouth at bedtime.  Dispense: 90 capsule; Refill: 1    Meds ordered this encounter  Medications   gabapentin (NEURONTIN) 300 MG capsule    Sig: Take 1 capsule (300 mg total) by mouth at bedtime.    Dispense:  90 capsule    Refill:  1   spironolactone (ALDACTONE) 25 MG tablet    Sig: Take 1 tablet (25 mg total) by mouth daily.    Dispense:  90 tablet    Refill:  1   amLODipine (NORVASC) 10 MG tablet    Sig: Take 1 tablet (10 mg total) by mouth daily.    Dispense:  90 tablet    Refill:  1   atorvastatin (LIPITOR) 40 MG tablet    Sig: Take 1 tablet (40 mg total) by mouth daily.    Dispense:  90 tablet    Refill:  1   carvedilol (COREG) 25 MG tablet    Sig: Take 1 tablet (25 mg total) by mouth 2 (two) times daily with a meal.    Dispense:  180 tablet    Refill:  1    Dispense 90-day supply; dose change   cloNIDine (CATAPRES) 0.3 MG tablet    Sig: Take 1 tablet (0.3 mg total) by mouth 2 (two) times daily.    Dispense:  180 tablet    Refill:  1   glipiZIDE (GLUCOTROL) 10 MG tablet    Sig:  TAKE ONE TABLET BY MOUTH TWICE DAILY BEFORE MEALS    Dispense:  180 tablet    Refill:  1   lisinopril-hydrochlorothiazide (ZESTORETIC) 20-12.5 MG tablet    Sig: Take 2 tablets by mouth daily.    Dispense:  180 tablet  Refill:  1    Follow-up: Return in about 3 months (around 04/26/2022) for Chronic medical conditions.       Charlott Rakes, MD, FAAFP. [facility] [place] [place], [place]   01/24/2022, 2:58 PM

## 2022-01-24 NOTE — Patient Instructions (Signed)
Diabetes Mellitus and Foot Care Foot care is an important part of your health, especially when you have diabetes. Diabetes may cause you to have problems because of poor blood flow (circulation) to your feet and legs, which can cause your skin to: Become thinner and drier. Break more easily. Heal more slowly. Peel and crack. You may also have nerve damage (neuropathy) in your legs and feet, causing decreased feeling in them. This means that you may not notice minor injuries to your feet that could lead to more serious problems. Noticing and addressing any potential problems early is the best way to prevent future foot problems. How to care for your feet Foot hygiene  Wash your feet daily with warm water and mild soap. Do not use hot water. Then, pat your feet and the areas between your toes until they are completely dry. Do not soak your feet as this can dry your skin. Trim your toenails straight across. Do not dig under them or around the cuticle. File the edges of your nails with an emery board or nail file. Apply a moisturizing lotion or petroleum jelly to the skin on your feet and to dry, brittle toenails. Use lotion that does not contain alcohol and is unscented. Do not apply lotion between your toes. Shoes and socks Wear clean socks or stockings every day. Make sure they are not too tight. Do not wear knee-high stockings since they may decrease blood flow to your legs. Wear shoes that fit properly and have enough cushioning. Always look in your shoes before you put them on to be sure there are no objects inside. To break in new shoes, wear them for just a few hours a day. This prevents injuries on your feet. Wounds, scrapes, corns, and calluses  Check your feet daily for blisters, cuts, bruises, sores, and redness. If you cannot see the bottom of your feet, use a mirror or ask someone for help. Do not cut corns or calluses or try to remove them with medicine. If you find a minor scrape,  cut, or break in the skin on your feet, keep it and the skin around it clean and dry. You may clean these areas with mild soap and water. Do not clean the area with peroxide, alcohol, or iodine. If you have a wound, scrape, corn, or callus on your foot, look at it several times a day to make sure it is healing and not infected. Check for: Redness, swelling, or pain. Fluid or blood. Warmth. Pus or a bad smell. General tips Do not cross your legs. This may decrease blood flow to your feet. Do not use heating pads or hot water bottles on your feet. They may burn your skin. If you have lost feeling in your feet or legs, you may not know this is happening until it is too late. Protect your feet from hot and cold by wearing shoes, such as at the beach or on hot pavement. Schedule a complete foot exam at least once a year (annually) or more often if you have foot problems. Report any cuts, sores, or bruises to your health care provider immediately. Where to find more information [employer] Diabetes Association: www.diabetes.org [employer]: www.diabeteseducator.org Contact a health care provider if: You have a medical condition that increases your risk of infection and you have any cuts, sores, or bruises on your feet. You have an injury that is not healing. You have redness on your legs or feet. You   feel burning or tingling in your legs or feet. You have pain or cramps in your legs and feet. Your legs or feet are numb. Your feet always feel cold. You have pain around any toenails. Get help right away if: You have a wound, scrape, corn, or callus on your foot and: You have pain, swelling, or redness that gets worse. You have fluid or blood coming from the wound, scrape, corn, or callus. Your wound, scrape, corn, or callus feels warm to the touch. You have pus or a bad smell coming from the wound, scrape, corn, or callus. You have a fever. You have a red  line going up your leg. Summary Check your feet every day for blisters, cuts, bruises, sores, and redness. Apply a moisturizing lotion or petroleum jelly to the skin on your feet and to dry, brittle toenails. Wear shoes that fit properly and have enough cushioning. If you have foot problems, report any cuts, sores, or bruises to your health care provider immediately. Schedule a complete foot exam at least once a year (annually) or more often if you have foot problems. This information is not intended to replace advice given to you by your health care provider. Make sure you discuss any questions you have with your health care provider. Document Revised: 02/19/2020 Document Reviewed: 02/19/2020 [employer] Patient Education  2023 [employer]  

## 2022-01-25 ENCOUNTER — Encounter (HOSPITAL_BASED_OUTPATIENT_CLINIC_OR_DEPARTMENT_OTHER): Payer: Medicare Other | Admitting: General Surgery

## 2022-01-25 DIAGNOSIS — I10 Essential (primary) hypertension: Secondary | ICD-10-CM | POA: Diagnosis not present

## 2022-01-25 DIAGNOSIS — I872 Venous insufficiency (chronic) (peripheral): Secondary | ICD-10-CM | POA: Diagnosis not present

## 2022-01-25 DIAGNOSIS — S81801A Unspecified open wound, right lower leg, initial encounter: Secondary | ICD-10-CM | POA: Diagnosis not present

## 2022-01-25 DIAGNOSIS — L97812 Non-pressure chronic ulcer of other part of right lower leg with fat layer exposed: Secondary | ICD-10-CM | POA: Diagnosis not present

## 2022-01-25 DIAGNOSIS — E1165 Type 2 diabetes mellitus with hyperglycemia: Secondary | ICD-10-CM | POA: Diagnosis not present

## 2022-01-25 DIAGNOSIS — E11622 Type 2 diabetes mellitus with other skin ulcer: Secondary | ICD-10-CM | POA: Diagnosis not present

## 2022-01-25 NOTE — Progress Notes (Signed)
AUDRA, BELLARD ([id number]) Visit Report for 01/25/2022 Chief Complaint Document Details Patient Name: Date of Service: Anthony Barnes [name] 01/25/2022 3:45 PM Medical Record Number: [id number] Patient Account Number: [id number] Date of Birth/Sex: Treating RN: [birth year] (66 y.o. [name] Primary Care Provider: Hoy Register Other Clinician: Referring Provider: Treating Provider/Extender: Camillo Flaming Weeks in Treatment: 3 Information Obtained from: Patient Chief Complaint Right LE Ulcer Electronic Signature(s) Signed: 01/25/2022 5:06:06 PM By: Duanne Guess MD FACS Entered By: Duanne Guess on 01/25/2022 17:06:06 -------------------------------------------------------------------------------- Debridement Details Patient Name: Date of Service: Anthony Barnes, Anthony [name] 01/25/2022 3:45 PM Medical Record Number: [id number] Patient Account Number: [id number] Date of Birth/Sex: Treating RN: 14-Aug-1956 (66 y.o. [name] Primary Care Provider: Hoy Register Other Clinician: Referring Provider: Treating Provider/Extender: Camillo Flaming Weeks in Treatment: 3 Debridement Performed for Assessment: Wound #2 Right,Lateral Lower Leg Performed By: Physician Duanne Guess, MD Debridement Type: Debridement Severity of Tissue Pre Debridement: Fat layer exposed Level of Consciousness (Pre-procedure): Awake and Alert Pre-procedure Verification/Time Out Yes - 16:14 Taken: Start Time: 16:14 Pain Control: Lidocaine 4% T opical Solution T Area Debrided (L x W): otal 3.3 (cm) x 1.2 (cm) = 3.96 (cm) Tissue and other material debrided: Viable, Non-Viable, Slough, Subcutaneous, Slough Level: Skin/Subcutaneous Tissue Debridement Description: Excisional Instrument: Curette Bleeding: Minimum Hemostasis Achieved: Pressure Procedural Pain: 8 Post Procedural Pain: 2 Response to Treatment: Procedure was tolerated well Level of  Consciousness (Post- Awake and Alert procedure): Post Debridement Measurements of Total Wound Length: (cm) 3.3 Width: (cm) 1.2 Depth: (cm) 0.1 Volume: (cm) 0.311 Character of Wound/Ulcer Post Debridement: Improved Severity of Tissue Post Debridement: Fat layer exposed Post Procedure Diagnosis Same as Pre-procedure Electronic Signature(s) Signed: 01/25/2022 5:18:47 PM By: Duanne Guess MD FACS Signed: 01/25/2022 5:19:00 PM By: Samuella Bruin Entered By: Samuella Bruin on 01/25/2022 16:15:22 -------------------------------------------------------------------------------- HPI Details Patient Name: Date of Service: Anthony Barnes, Anthony [name] 01/25/2022 3:45 PM Medical Record Number: [id number] Patient Account Number: [id number] Date of Birth/Sex: Treating RN: [birth year] (66 y.o. [name] Primary Care Provider: Hoy Register Other Clinician: Referring Provider: Treating Provider/Extender: Camillo Flaming Weeks in Treatment: 3 History of Present Illness HPI Description: ADMISSION 03/12/18 This is a 66 year old and it was a type II diabetic reasonably poorly controlled with a recent hemoglobin A1c of 8.2. He was tending to his lawn on 01/15/18 when his weed Anthony Barnes sent a rock up word hitting him in the right anterior leg. He was seen in his primary M.D. office is same time. An x-ray showed no abnormality. He was given a course of cephalexin. Since then he's been applying peroxide and topical antibiotics to the wound. He does not have a history of chronic wounds however he does have chronic edema in the lower legs. He is complaining of pain in the right leg wound but no real history of claudication. Past medical history includes type 2 diabetes, hypertension, morbid obesity. ABIs in the right leg were noncompressible 03/19/18 on evaluation today patient appears to be tolerating the wrap in general fairly well. He states he's not having that much pain in regard  to the wound itself. With that being said he is having some issues with slough buildup on the surface of the wound the Iodoflex does seem to be helpful in that regard. He is still having discomfort he wonders if I can send in a prescription for ibuprofen or something of like to help him out. I do believe that the prox  end may be of benefit for him. 03/25/18 on evaluation today patient appears to be doing very well in regard to his right lateral lower [name]. He's been tolerating the dressing changes without complication that is the wraps. Nonetheless he does continue to have pain he states is been dreading the possibility of having to have debridement again today. His first debridement experience was not optimal. With that being said he does seem to be showing signs of improvement there does appear to be little bit more granulation he does have a lot of slough that really does need to breeding away. 04/04/18;the patient went for arterial studies that were really quite normal. ABI on the right at 1.19 with triphasic waveforms on the left 1.11 with triphasic waveforms. TBI 0.93 on the right and 0.95 on the left. This suggests T should be able to tolerate even 4 layer compression. He is however complaining of a lot of pain with our wraps I'm wondering whether the pain is simply Iodoflex. I changed him to [employer] covering Hydrofera Blue. I'm still going to try to keep him in 3 layer compression 04/12/18 on evaluation today patient actually appears to be doing rather well in regard to his right lower [name] ulcer. He is making good progress although it is slow still I do believe that the Santyl is much better than the Iodoflex. The good news is the patient seems to be tolerating the dressing changes without complication now that we've gotten good of the Iodoflex which really did burn him quite significantly. Otherwise there's no evidence of infection. 04/17/18 on evaluation today patient  actually appears to be showing signs of improvement in regard to the ulcer. Unfortunately he's had somewhat of a rough day due to the fact that he found out that one of his friends suddenly and unexpectedly passed today. He states he's not sure he's in the frame of mind to allow me to debride the wound at this point. Nonetheless I do feel like he is showing signs of the wound getting better little by little each week. 04/24/18 on evaluation today patient's ulcer actually appears to be showing some signs of improvement albeit slow. He has been tolerating the dressing changes without complication except for the wrap which she states was so tight that he had to remove it it was causing a lot of discomfort. Fortunately there is no evidence of infection at this point. He states he only wore the wrap until about the time he got home last week and that he had to take it off. Nonetheless he does not have any other compression stockings, Juxta-Lite wraps, or anything otherwise to really help at this point as far as compression is concerned. 05/01/18 on evaluation today patient actually appears to be doing fairly well in regard to his lower extremity ulcer. He has been tolerating the dressing changes currently without complication. The wrap does seem to be helping as far as the fluid management is concerned. Wound bed itself actually show signs of good improvement although there is some Slough noted there's not as much as previous and he actually has fairly decent granulation noted as well. Overall I'm pleased with the progress of to this point. The patient would prefer not to have any debridement today he states he's actually not feeling too well in general and he really does not want any additional discomfort typically debridement is fairly uncomfortable for him. 05/08/18 on evaluation today patient actually appears to be doing a little better  in regard to his lower extremity ulcer. He has been tolerating the  dressing changes without complication. With that being said I'm actually quite pleased with the fact the wound is not appear to be infected and in general has made a little progress. With that being said I do believe we need to perform some debridement to clean away the necrotic tissue on the surface of the wound. 05/15/18 on evaluation today patient actually appears to be doing a little better in regard to his wound. Fortunately there is some improvement noted fortunately there's also no significant evidence of infection at this point in time. He does have continued pain that really nothing different than previous he did get his Juxta-Lite wrap. 05/29/18 on evaluation today patient actually appears to be doing somewhat better in regard to his wound currently. He's been tolerating the dressing changes without complication. With that being said he has been performing the [facility] Dressing changes every day at home. I'm pretty sure that we told him every other day last week or when I saw him rather two weeks ago. With that being said obviously did not hurt to do it daily just that obviously is gonna run out of supplies sooner and that could get him into trouble as far as his insurance is concerned. Nonetheless he at this point still continues to have pain although honestly I don't feel like it's as bad as what it was in the past just based on his reactions at this point. 06/12/18 on evaluation today patient actually appears to be doing better in regard to his lower [name] ulcer. He's been tolerating the dressing changes without complication. Fortunately there does not appear to be any evidence of infection at this time. No fevers, chills, nausea, or vomiting noted at this time. 06/19/18 evaluation today patient's ulcer on the lower extremity appears to be doing better at this point. he has been tolerating the dressing changes. The patient seems to be somewhat depressed about the progress of his  wound although the overall appearance at this time seems to be doing well. In general I'm very pleased with the appearance today he does have some biofilm on the surface of the wound as well as of hyper granular tissue we may want to utilize a little bit of silver nitrate today. 06/27/2018; patient comes into clinic today for a wound on the right lateral calf likely related to chronic venous insufficiency. He has been using medihoney covered with Hydrofera Blue. Wound surface actually looks quite good 07/03/2018 seen today for follow-up and management of lower extremity ulcer right lateral shin. T olerating dressing changes. He has a small amount of biofilm today. Will attempt to remove a portion of the biofilm as he tolerates. He becomes very anxious with wound treatments. He would benefit from layer compression wraps however he refuses compression wraps or anything that smokes his legs. He expressed an inability to tolerate compression wraps. Juxta lite wraps have been ordered. He has been instructed on the appropriate application of the juxta leg wraps. His blood pressure today on visit was 200/120. He denies any blurred vision, chest pain, dizziness, shortness of breath, or difficulty with mobility. Mr. Lenzen stated that he had a recent visit with his primary care provider. At that time his carvedilol was decreased from 25 mg daily to 12.5 mg daily. Strongly encouraged Mr. Buening to seek medical attention today during visit. He declined transport for evaluation of elevated blood pressure. Encouraged him to contact his primary care  provider today for medication management. He stated that he would call his primary care doctor after wound visit today. Also encouraged him that if he experienced any symptoms of blurred vision, chest pain, shortness of breath to immediately seek medical attention. 07/17/18 on evaluation today patient's wound actually does seem to show signs of improvement based on the  overall appearance of the wound bed today. There does not appear to show any signs of infection which is good news. There is no overall worsening which is also good news. He still has hyper granular tissue will use in the [employer] followed by [facility] Dressing this point. 07/31/18 on evaluation today patient's wound bed actually show signs of improvement with good epithelialization especially in the upper portion of the wound. He has been tolerating the dressing changes without complication in general. Overall I'm extremely happy with how things stand. No fevers, chills, nausea, or vomiting noted at this time. 08/28/18 on evaluation today patient appears to be doing a little better in regard to his lower extremity ulcer. With that being said it appears to be very hyper granular I think this is directly attributed to the fact that he continues to use the [employer] underneath the [facility] Dressing. Although this helps not to stick unfortunately also think he's not getting the benefit of the [facility] Dressing particular and subsequently is causing too much moisture buildup hence the hyper granulation. Fortunately there does not appear to be any signs of infection at this time which is good news. 09/03/18 on evaluation today patient appears to be doing well in regard to his lower [name] ulcer. He has been tolerating the dressing changes without complication. Fortunately he has much less hyper granular tissue the noted last week I do think using silver nitrate in changing to using the [facility] Dressing without the mepitel has made a big difference for him this is good news. 09/18/18 on evaluation today patient appears to be doing excellent in regard to his right lower [name] ulcer. This is actually significantly smaller even compared to the last evaluation. Overall I'm very pleased in this regard. I'm a recommend that we likely repeat the silver nitrate today based on what I'm  seeing. 10/02/18 on evaluation today patient appears to be doing excellent in regard to his lower extremity wound on the right. He's been tolerating the dressing changes without complication. Fortunately there's no signs of infection at this time. Overall been very pleased with how things seem to be progressing. No fevers, chills, nausea, or vomiting noted at this time. 10/16/18 on evaluation today patient actually appears to be doing much better in regard to his lower extremity wound on the right. This is shown signs of good improvement and is indeed measuring smaller he is on a excellent track as far as healing is concerned. My hope is this will be healed of the next several weeks. Fortunately there is no evidence of infection at this time. He does seem to be doing everything that I'm recommending for him 10/30/18 on evaluation today patient appears to be doing better in regard to his lower extremity ulcer. He's been tolerating the dressing changes without complication. Fortunately the Hydrofera Blue Dressing to be doing the job. He has made excellent progress. With that being said this is still going somewhat slow but nonetheless is always making good progress at this point. 5/18-Patient was in to be seen for right leg ulcer that appeared after scab above it was removed today. This area had  healed completely. 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 The wound has not healed though it has been nearly 2 months since the injury occurred. He was seen in the emergency department at [facility] on May 9. At that time it was very painful and he described it as a "15 on a scale of 010". He also was found to have 3+ pitting edema to the  bilateral lower extremities. He was prescribed a weeks course of Keflex. He is here today because the wound has failed to heal and he has a prior history in our clinic. ABI in clinic today was noncompressible. He did have formal vascular studies performed in 2019 which were normal. The last hemoglobin A1c I have available for review was from March 21, 2021 and was elevated at 7.7%. The wound is fairly small and circular located about 3 inches above his right lateral malleolus. It is completely covered with eschar. No surrounding erythema, no odor, no purulent drainage. 01/11/2022: The wound on his right lateral leg is a little bit smaller today. It has reaccumulated some slough and a small bit of eschar. It remains fairly painful. He has another area on his anterior tibia that he will not let me touch but I am concerned that there is a wound forming underneath the surface. 01/18/2022: Unfortunately, his wound is a little bit bigger today. He continues to accumulate slough on the wound surface. It is still quite tender. 01/25/2022: The patient bumped his wound on the car door which resulted in bleeding. He was seen at urgent care where it was redressed. Unfortunately, the wound is bigger again today. There is accumulated slough on the surface. Urgent care gave him some tramadol, apparently and he took this prior to his visit so he could tolerate debridement better. Electronic Signature(s) Signed: 01/25/2022 5:07:53 PM By: Duanne Guess MD FACS Entered By: Duanne Guess on 01/25/2022 17:07:53 -------------------------------------------------------------------------------- Physical Exam Details Patient Name: Date of Service: Anthony Barnes, Anthony [name] 01/25/2022 3:45 PM Medical Record Number: [id number] Patient Account Number: [id number] Date of Birth/Sex: Treating RN: Jun 20, 1956 (66 y.o. [name] Primary Care Provider: Hoy Register Other Clinician: Referring Provider: Treating  Provider/Extender: Camillo Flaming Weeks in Treatment: 3 Constitutional Hypertensive, asymptomatic. . . . No acute distress.Marland Kitchen Respiratory Normal work of breathing on room air.. Notes 01/25/2022: Unfortunately, the wound is bigger again today. There is accumulated slough on the surface. Electronic Signature(s) Signed: 01/25/2022 5:08:34 PM By: Duanne Guess MD FACS Entered By: Duanne Guess on 01/25/2022 17:08:34 -------------------------------------------------------------------------------- Physician Orders Details Patient Name: Date of Service: Anthony Barnes, Anthony [name] 01/25/2022 3:45 PM Medical Record Number: [id number] Patient Account Number: [id number] Date of Birth/Sex: Treating RN: 19-Jun-1956 (66 y.o. [name] Primary Care Provider: Hoy Register Other Clinician: Referring Provider: Treating Provider/Extender: Camillo Flaming Weeks in Treatment: 3 Verbal / Phone Orders: No Diagnosis Coding ICD-10 Coding Code Description [phone] Non-pressure chronic ulcer of other part of right lower leg with fat layer exposed E11.622 Type 2 diabetes mellitus with other skin ulcer E11.9 Type 2 diabetes mellitus without complications I10 Essential (primary) hypertension I87.2 Venous insufficiency (chronic) (peripheral) Follow-up Appointments ppointment in 1 week. - Dr. Lady Gary - Room 2 - 6/22  at 9:15 AM Return A Bathing/ Shower/ Hygiene May shower with protection but do not get wound dressing(s) wet. - may purchase a cast protector from [facility] or [facility] Edema Control - Lymphedema / SCD / Other Elevate legs to the level of the heart or above for 30 minutes daily and/or when sitting, a frequency of: Avoid standing for long periods of time. Patient to wear own compression stockings every day. Exercise regularly Moisturize legs daily. Compression stocking or Garment 20-30 mm/Hg pressure to: - to L left until R leg is healed Wound  Treatment Wound #2 - Lower Leg Wound Laterality: Right, Lateral Cleanser: Soap and Water 1 x Per Week/30 Days Discharge Instructions: May shower and wash wound with dial antibacterial soap and water prior to dressing change. Cleanser: Wound Cleanser 1 x Per Week/30 Days Discharge Instructions: Cleanse the wound with wound cleanser prior to applying a clean dressing using gauze sponges, not tissue or cotton balls. Peri-Wound Care: Sween Lotion (Moisturizing lotion) 1 x Per Week/30 Days Discharge Instructions: Apply moisturizing lotion as directed Prim Dressing: IODOFLEX 0.9% Cadexomer Iodine Pad 4x6 cm 1 x Per Week/30 Days ary Discharge Instructions: Apply to wound bed as instructed Secondary Dressing: Woven Gauze Sponge, Non-Sterile 4x4 in 1 x Per Week/30 Days Discharge Instructions: Apply over primary dressing as directed. Secured With: Coban Self-Adherent Wrap 4x5 (in/yd) 1 x Per Week/30 Days Discharge Instructions: Secure with Coban as directed. Secured With: [employer], 4.5x3.1 (in/yd) 1 x Per Week/30 Days Discharge Instructions: Secure with Kerlix as directed. Secured With: 31M Medipore H Soft Cloth Surgical T ape, 4 x 10 (in/yd) 1 x Per Week/30 Days Discharge Instructions: Secure with tape as directed. Electronic Signature(s) Signed: 01/25/2022 5:10:38 PM By: Duanne Guess MD FACS Entered By: Duanne Guess on 01/25/2022 17:10:38 -------------------------------------------------------------------------------- Problem List Details Patient Name: Date of Service: Anthony Barnes, Anthony [name] 01/25/2022 3:45 PM Medical Record Number: [id number] Patient Account Number: [id number] Date of Birth/Sex: Treating RN: 12-24-1955 (66 y.o. [name] Primary Care Provider: Hoy Register Other Clinician: Referring Provider: Treating Provider/Extender: Camillo Flaming Weeks in Treatment: 3 Active Problems ICD-10 Encounter Code Description Active Date  MDM Diagnosis [phone] Non-pressure chronic ulcer of other part of right lower leg with fat layer 01/02/2022 No Yes exposed E11.622 Type 2 diabetes mellitus with other skin ulcer 01/02/2022 No Yes E11.9 Type 2 diabetes mellitus without complications 01/02/2022 No Yes I10 Essential (primary) hypertension 01/02/2022 No Yes I87.2 Venous insufficiency (chronic) (peripheral) 01/02/2022 No Yes Inactive Problems Resolved Problems Electronic Signature(s) Signed: 01/25/2022 5:04:55 PM By: Duanne Guess MD FACS Entered By: Duanne Guess on 01/25/2022 17:04:55 -------------------------------------------------------------------------------- Progress Note Details Patient Name: Date of Service: Anthony Barnes, Anthony [name] 01/25/2022 3:45 PM Medical Record Number: [id number] Patient Account Number: [id number] Date of Birth/Sex: Treating RN: 12/03/1955 (66 y.o. [name] Primary Care Provider: Hoy Register Other Clinician: Referring Provider: Treating Provider/Extender: Camillo Flaming Weeks in Treatment: 3 Subjective Chief Complaint Information obtained from Patient Right LE Ulcer History of Present Illness (HPI) ADMISSION 03/12/18 This is a 66 year old and it was a type II diabetic reasonably poorly controlled with a recent hemoglobin A1c of 8.2. He was tending to his lawn on 01/15/18 when his weed Anthony Barnes sent a rock up word hitting him in the right anterior leg. He was seen in his primary M.D. office is same time. An x-ray showed no abnormality. He was given a course of cephalexin. Since then he's been applying peroxide and topical antibiotics to the  wound. He does not have a history of chronic wounds however he does have chronic edema in the lower legs. He is complaining of pain in the right leg wound but no real history of claudication. Past medical history includes type 2 diabetes, hypertension, morbid obesity. ABIs in the right leg were noncompressible 03/19/18 on  evaluation today patient appears to be tolerating the wrap in general fairly well. He states he's not having that much pain in regard to the wound itself. With that being said he is having some issues with slough buildup on the surface of the wound the Iodoflex does seem to be helpful in that regard. He is still having discomfort he wonders if I can send in a prescription for ibuprofen or something of like to help him out. I do believe that the prox end may be of benefit for him. 03/25/18 on evaluation today patient appears to be doing very well in regard to his right lateral lower [name]. He's been tolerating the dressing changes without complication that is the wraps. Nonetheless he does continue to have pain he states is been dreading the possibility of having to have debridement again today. His first debridement experience was not optimal. With that being said he does seem to be showing signs of improvement there does appear to be little bit more granulation he does have a lot of slough that really does need to breeding away. 04/04/18;the patient went for arterial studies that were really quite normal. ABI on the right at 1.19 with triphasic waveforms on the left 1.11 with triphasic waveforms. TBI 0.93 on the right and 0.95 on the left. This suggests T should be able to tolerate even 4 layer compression. He is however complaining of a lot of pain with our wraps I'm wondering whether the pain is simply Iodoflex. I changed him to [employer] I'm still going to try to keep him in 3 layer compression 04/12/18 on evaluation today patient actually appears to be doing rather well in regard to his right lower [name] ulcer. He is making good progress although it is slow still I do believe that the Santyl is much better than the Iodoflex. The good news is the patient seems to be tolerating the dressing changes without complication now that we've gotten good of the  Iodoflex which really did burn him quite significantly. Otherwise there's no evidence of infection. 04/17/18 on evaluation today patient actually appears to be showing signs of improvement in regard to the ulcer. Unfortunately he's had somewhat of a rough day due to the fact that he found out that one of his friends suddenly and unexpectedly passed today. He states he's not sure he's in the frame of mind to allow me to debride the wound at this point. Nonetheless I do feel like he is showing signs of the wound getting better little by little each week. 04/24/18 on evaluation today patient's ulcer actually appears to be showing some signs of improvement albeit slow. He has been tolerating the dressing changes without complication except for the wrap which she states was so tight that he had to remove it it was causing a lot of discomfort. Fortunately there is no evidence of infection at this point. He states he only wore the wrap until about the time he got home last week and that he had to take it off. Nonetheless he does not have any other compression stockings, Juxta-Lite wraps, or anything otherwise to really help at this  point as far as compression is concerned. 05/01/18 on evaluation today patient actually appears to be doing fairly well in regard to his lower extremity ulcer. He has been tolerating the dressing changes currently without complication. The wrap does seem to be helping as far as the fluid management is concerned. Wound bed itself actually show signs of good improvement although there is some Slough noted there's not as much as previous and he actually has fairly decent granulation noted as well. Overall I'm pleased with the progress of to this point. The patient would prefer not to have any debridement today he states he's actually not feeling too well in general and he really does not want any additional discomfort typically debridement is fairly uncomfortable for him. 05/08/18 on  evaluation today patient actually appears to be doing a little better in regard to his lower extremity ulcer. He has been tolerating the dressing changes without complication. With that being said I'm actually quite pleased with the fact the wound is not appear to be infected and in general has made a little progress. With that being said I do believe we need to perform some debridement to clean away the necrotic tissue on the surface of the wound. 05/15/18 on evaluation today patient actually appears to be doing a little better in regard to his wound. Fortunately there is some improvement noted fortunately there's also no significant evidence of infection at this point in time. He does have continued pain that really nothing different than previous he did get his Juxta-Lite wrap. 05/29/18 on evaluation today patient actually appears to be doing somewhat better in regard to his wound currently. He's been tolerating the dressing changes without complication. With that being said he has been performing the [facility] Dressing changes every day at home. I'm pretty sure that we told him every other day last week or when I saw him rather two weeks ago. With that being said obviously did not hurt to do it daily just that obviously is gonna run out of supplies sooner and that could get him into trouble as far as his insurance is concerned. Nonetheless he at this point still continues to have pain although honestly I don't feel like it's as bad as what it was in the past just based on his reactions at this point. 06/12/18 on evaluation today patient actually appears to be doing better in regard to his lower [name] ulcer. He's been tolerating the dressing changes without complication. Fortunately there does not appear to be any evidence of infection at this time. No fevers, chills, nausea, or vomiting noted at this time. 06/19/18 evaluation today patient's ulcer on the lower extremity appears to be doing  better at this point. he has been tolerating the dressing changes. The patient seems to be somewhat depressed about the progress of his wound although the overall appearance at this time seems to be doing well. In general I'm very pleased with the appearance today he does have some biofilm on the surface of the wound as well as of hyper granular tissue we may want to utilize a little bit of silver nitrate today. 06/27/2018; patient comes into clinic today for a wound on the right lateral calf likely related to chronic venous insufficiency. He has been using medihoney covered with Hydrofera Blue. Wound surface actually looks quite good 07/03/2018 seen today for follow-up and management of lower extremity ulcer right lateral shin. T olerating dressing changes. He has a small amount of biofilm today. Will attempt  to remove a portion of the biofilm as he tolerates. He becomes very anxious with wound treatments. He would benefit from layer compression wraps however he refuses compression wraps or anything that smokes his legs. He expressed an inability to tolerate compression wraps. Juxta lite wraps have been ordered. He has been instructed on the appropriate application of the juxta leg wraps. His blood pressure today on visit was 200/120. He denies any blurred vision, chest pain, dizziness, shortness of breath, or difficulty with mobility. Mr. Lowdermilk stated that he had a recent visit with his primary care provider. At that time his carvedilol was decreased from 25 mg daily to 12.5 mg daily. Strongly encouraged Mr. Seiden to seek medical attention today during visit. He declined transport for evaluation of elevated blood pressure. Encouraged him to contact his primary care provider today for medication management. He stated that he would call his primary care doctor after wound visit today. Also encouraged him that if he experienced any symptoms of blurred vision, chest pain, shortness of breath to  immediately seek medical attention. 07/17/18 on evaluation today patient's wound actually does seem to show signs of improvement based on the overall appearance of the wound bed today. There does not appear to show any signs of infection which is good news. There is no overall worsening which is also good news. He still has hyper granular tissue will use in the [employer] followed by [facility] Dressing this point. 07/31/18 on evaluation today patient's wound bed actually show signs of improvement with good epithelialization especially in the upper portion of the wound. He has been tolerating the dressing changes without complication in general. Overall I'm extremely happy with how things stand. No fevers, chills, nausea, or vomiting noted at this time. 08/28/18 on evaluation today patient appears to be doing a little better in regard to his lower extremity ulcer. With that being said it appears to be very hyper granular I think this is directly attributed to the fact that he continues to use the [employer] underneath the [facility] Dressing. Although this helps not to stick unfortunately also think he's not getting the benefit of the [facility] Dressing particular and subsequently is causing too much moisture buildup hence the hyper granulation. Fortunately there does not appear to be any signs of infection at this time which is good news. 09/03/18 on evaluation today patient appears to be doing well in regard to his lower [name] ulcer. He has been tolerating the dressing changes without complication. Fortunately he has much less hyper granular tissue the noted last week I do think using silver nitrate in changing to using the [facility] Dressing without the mepitel has made a big difference for him this is good news. 09/18/18 on evaluation today patient appears to be doing excellent in regard to his right lower [name] ulcer. This is actually significantly smaller even compared to the last  evaluation. Overall I'm very pleased in this regard. I'm a recommend that we likely repeat the silver nitrate today based on what I'm seeing. 10/02/18 on evaluation today patient appears to be doing excellent in regard to his lower extremity wound on the right. He's been tolerating the dressing changes without complication. Fortunately there's no signs of infection at this time. Overall been very pleased with how things seem to be progressing. No fevers, chills, nausea, or vomiting noted at this time. 10/16/18 on evaluation today patient actually appears to be doing much better in regard to his lower extremity wound on the  right. This is shown signs of good improvement and is indeed measuring smaller he is on a excellent track as far as healing is concerned. My hope is this will be healed of the next several weeks. Fortunately there is no evidence of infection at this time. He does seem to be doing everything that I'm recommending for him 10/30/18 on evaluation today patient appears to be doing better in regard to his lower extremity ulcer. He's been tolerating the dressing changes without complication. Fortunately the Hydrofera Blue Dressing to be doing the job. He has made excellent progress. With that being said this is still going somewhat slow but nonetheless is always making good progress at this point. 5/18-Patient was in to be seen for right leg ulcer that appeared after scab above it was removed today. This area had healed completely. 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 The wound has not healed though it has been nearly 2 months since the injury occurred. He was seen in the emergency department at [facility] on May  9. At that time it was very painful and he described it as a "15 on a scale of 0oo10". He also was found to have 3+ pitting edema to the bilateral lower extremities. He was prescribed a weeks course of Keflex. He is here today because the wound has failed to heal and he has a prior history in our clinic. ABI in clinic today was noncompressible. He did have formal vascular studies performed in 2019 which were normal. The last hemoglobin A1c I have available for review was from March 21, 2021 and was elevated at 7.7%. The wound is fairly small and circular located about 3 inches above his right lateral malleolus. It is completely covered with eschar. No surrounding erythema, no odor, no purulent drainage. 01/11/2022: The wound on his right lateral leg is a little bit smaller today. It has reaccumulated some slough and a small bit of eschar. It remains fairly painful. He has another area on his anterior tibia that he will not let me touch but I am concerned that there is a wound forming underneath the surface. 01/18/2022: Unfortunately, his wound is a little bit bigger today. He continues to accumulate slough on the wound surface. It is still quite tender. 01/25/2022: The patient bumped his wound on the car door which resulted in bleeding. He was seen at urgent care where it was redressed. Unfortunately, the wound is bigger again today. There is accumulated slough on the surface. Urgent care gave him some tramadol, apparently and he took this prior to his visit so he could tolerate debridement better. Patient History Information obtained from Patient. Family History Cancer - Mother, Diabetes - Mother,Father, Hypertension - Mother,Father, Kidney Disease - Siblings, Stroke - Father, No family history of Heart Disease, Hereditary Spherocytosis, Lung Disease, Seizures, Thyroid Problems, Tuberculosis. Social History Former smoker - ended on 08/14/1990, Marital Status - Married, Alcohol Use - Moderate, Drug Use  - No History, Caffeine Use - Daily. Medical History Eyes Denies history of Cataracts, Glaucoma, Optic Neuritis Ear/Nose/Mouth/Throat Denies history of Chronic sinus problems/congestion, Middle ear problems Hematologic/Lymphatic Denies history of Anemia, Hemophilia, Human Immunodeficiency Virus, Lymphedema, Sickle Cell Disease Respiratory Denies history of Aspiration, Asthma, Chronic  Obstructive Pulmonary Disease (COPD), Pneumothorax, Sleep Apnea, Tuberculosis Cardiovascular Patient has history of Hypertension Denies history of Angina, Arrhythmia, Congestive Heart Failure, Coronary Artery Disease, Deep Vein Thrombosis, Hypotension, Myocardial Infarction, Peripheral Arterial Disease, Peripheral Venous Disease, Phlebitis, Vasculitis Gastrointestinal Denies history of Cirrhosis , Colitis, Crohnoos, Hepatitis A, Hepatitis B, Hepatitis C Endocrine Patient has history of Type II Diabetes Genitourinary Denies history of End Stage Renal Disease Immunological Denies history of Lupus Erythematosus, Raynaudoos, Scleroderma Integumentary (Skin) Denies history of History of Burn Musculoskeletal Denies history of Gout, Rheumatoid Arthritis, Osteoarthritis, Osteomyelitis Neurologic Denies history of Dementia, Neuropathy, Quadriplegia, Paraplegia, Seizure Disorder Oncologic Denies history of Received Chemotherapy, Received Radiation Psychiatric Denies history of Anorexia/bulimia, Confinement Anxiety Hospitalization/Surgery History - esophagogastroduodenoscopy. - brain aneurysm surgery. Medical A Surgical History Notes nd Constitutional Symptoms (General Health) obesity Gastrointestinal diverticulitis Neurologic neuropathy Objective Constitutional Hypertensive, asymptomatic. No acute distress.. Vitals Time Taken: 3:55 PM, Height: 75 in, Weight: 310 lbs, BMI: 38.7, Temperature: 98.4 F, Pulse: 74 bpm, Respiratory Rate: 18 breaths/min, Blood Pressure: 169/102 mmHg. Respiratory Normal  work of breathing on room air.. General Notes: 01/25/2022: Unfortunately, the wound is bigger again today. There is accumulated slough on the surface. Integumentary (Hair, Skin) Wound #2 status is Open. Original cause of wound was Trauma. The date acquired was: 12/03/2021. The wound has been in treatment 3 weeks. The wound is located on the Right,Lateral Lower Leg. The wound measures 3.3cm length x 1.2cm width x 0.1cm depth; 3.11cm^2 area and 0.311cm^3 volume. There is Fat Layer (Subcutaneous Tissue) exposed. There is no tunneling or undermining noted. There is a medium amount of serosanguineous drainage noted. The wound margin is distinct with the outline attached to the wound base. There is small (1-33%) red granulation within the wound bed. There is a large (67-100%) amount of necrotic tissue within the wound bed including Eschar and Adherent Slough. Assessment Active Problems ICD-10 Non-pressure chronic ulcer of other part of right lower leg with fat layer exposed Type 2 diabetes mellitus with other skin ulcer Type 2 diabetes mellitus without complications Essential (primary) hypertension Venous insufficiency (chronic) (peripheral) Procedures Wound #2 Pre-procedure diagnosis of Wound #2 is a Venous Leg Ulcer located on the Right,Lateral Lower Leg .Severity of Tissue Pre Debridement is: Fat layer exposed. There was a Excisional Skin/Subcutaneous Tissue Debridement with a total area of 3.96 sq cm performed by Duanne Guess, MD. With the following instrument(s): Curette to remove Viable and Non-Viable tissue/material. Material removed includes Subcutaneous Tissue and Slough and after achieving pain control using Lidocaine 4% T opical Solution. No specimens were taken. A time out was conducted at 16:14, prior to the start of the procedure. A Minimum amount of bleeding was controlled with Pressure. The procedure was tolerated well with a pain level of 8 throughout and a pain level of 2  following the procedure. Post Debridement Measurements: 3.3cm length x 1.2cm width x 0.1cm depth; 0.311cm^3 volume. Character of Wound/Ulcer Post Debridement is improved. Severity of Tissue Post Debridement is: Fat layer exposed. Post procedure Diagnosis Wound #2: Same as Pre-Procedure Plan Follow-up Appointments: Return Appointment in 1 week. - Dr. Lady Gary - Room 2 - 6/22 at 9:15 AM Bathing/ Shower/ Hygiene: May shower with protection but do not get wound dressing(s) wet. - may purchase a cast protector from [facility] or [facility] Edema Control - Lymphedema / SCD / Other: Elevate legs to the level of the heart or above for 30 minutes daily and/or when sitting, a frequency of: Avoid standing for long periods of time. Patient to  wear own compression stockings every day. Exercise regularly Moisturize legs daily. Compression stocking or Garment 20-30 mm/Hg pressure to: - to L left until R leg is healed WOUND #2: - Lower Leg Wound Laterality: Right, Lateral Cleanser: Soap and Water 1 x Per Week/30 Days Discharge Instructions: May shower and wash wound with dial antibacterial soap and water prior to dressing change. Cleanser: Wound Cleanser 1 x Per Week/30 Days Discharge Instructions: Cleanse the wound with wound cleanser prior to applying a clean dressing using gauze sponges, not tissue or cotton balls. Peri-Wound Care: Sween Lotion (Moisturizing lotion) 1 x Per Week/30 Days Discharge Instructions: Apply moisturizing lotion as directed Prim Dressing: IODOFLEX 0.9% Cadexomer Iodine Pad 4x6 cm 1 x Per Week/30 Days ary Discharge Instructions: Apply to wound bed as instructed Secondary Dressing: Woven Gauze Sponge, Non-Sterile 4x4 in 1 x Per Week/30 Days Discharge Instructions: Apply over primary dressing as directed. Secured With: Coban Self-Adherent Wrap 4x5 (in/yd) 1 x Per Week/30 Days Discharge Instructions: Secure with Coban as directed. Secured With: [employer], 4.5x3.1 (in/yd) 1 x Per  Week/30 Days Discharge Instructions: Secure with Kerlix as directed. Secured With: 107M Medipore H Soft Cloth Surgical T ape, 4 x 10 (in/yd) 1 x Per Week/30 Days Discharge Instructions: Secure with tape as directed. 01/25/2022: Unfortunately, the wound is bigger again today. There is accumulated slough on the surface. I used a curette to debride the slough from the wound surface. He tolerated this much better today, likely secondary to the tramadol he took prior to his visit. We will continue using Iodoflex on the wound. He does have an appointment in the vascular clinic on Friday, June 30; he apparently had been scheduled for May 31, but no-showed to his visit. Follow-up in 1 week. Electronic Signature(s) Signed: 01/25/2022 5:11:36 PM By: Duanne Guess MD FACS Entered By: Duanne Guess on 01/25/2022 17:11:36 -------------------------------------------------------------------------------- HxROS Details Patient Name: Date of Service: Anthony Barnes, Anthony [name] 01/25/2022 3:45 PM Medical Record Number: [id number] Patient Account Number: [id number] Date of Birth/Sex: Treating RN: [birth year] (66 y.o. [name] Primary Care Provider: Hoy Register Other Clinician: Referring Provider: Treating Provider/Extender: Camillo Flaming Weeks in Treatment: 3 Information Obtained From Patient Constitutional Symptoms (General Health) Medical History: Past Medical History Notes: obesity Eyes Medical History: Negative for: Cataracts; Glaucoma; Optic Neuritis Ear/Nose/Mouth/Throat Medical History: Negative for: Chronic sinus problems/congestion; Middle ear problems Hematologic/Lymphatic Medical History: Negative for: Anemia; Hemophilia; Human Immunodeficiency Virus; Lymphedema; Sickle Cell Disease Respiratory Medical History: Negative for: Aspiration; Asthma; Chronic Obstructive Pulmonary Disease (COPD); Pneumothorax; Sleep Apnea; Tuberculosis Cardiovascular Medical  History: Positive for: Hypertension Negative for: Angina; Arrhythmia; Congestive Heart Failure; Coronary Artery Disease; Deep Vein Thrombosis; Hypotension; Myocardial Infarction; Peripheral Arterial Disease; Peripheral Venous Disease; Phlebitis; Vasculitis Gastrointestinal Medical History: Negative for: Cirrhosis ; Colitis; Crohns; Hepatitis A; Hepatitis B; Hepatitis C Past Medical History Notes: diverticulitis Endocrine Medical History: Positive for: Type II Diabetes Time with diabetes: 8 years Treated with: Oral agents Blood sugar tested every day: No Genitourinary Medical History: Negative for: End Stage Renal Disease Immunological Medical History: Negative for: Lupus Erythematosus; Raynauds; Scleroderma Integumentary (Skin) Medical History: Negative for: History of Burn Musculoskeletal Medical History: Negative for: Gout; Rheumatoid Arthritis; Osteoarthritis; Osteomyelitis Neurologic Medical History: Negative for: Dementia; Neuropathy; Quadriplegia; Paraplegia; Seizure Disorder Past Medical History Notes: neuropathy Oncologic Medical History: Negative for: Received Chemotherapy; Received Radiation Psychiatric Medical History: Negative for: Anorexia/bulimia; Confinement Anxiety Immunizations Pneumococcal Vaccine: Received Pneumococcal Vaccination: No Immunization Notes: tetanus 2 years ago Implantable Devices None Hospitalization /  Surgery History Type of Hospitalization/Surgery esophagogastroduodenoscopy brain aneurysm surgery Family and Social History Cancer: Yes - Mother; Diabetes: Yes - Mother,Father; Heart Disease: No; Hereditary Spherocytosis: No; Hypertension: Yes - Mother,Father; Kidney Disease: Yes - Siblings; Lung Disease: No; Seizures: No; Stroke: Yes - Father; Thyroid Problems: No; Tuberculosis: No; Former smoker - ended on 08/14/1990; Marital Status - Married; Alcohol Use: Moderate; Drug Use: No History; Caffeine Use: Daily; Financial Concerns: No;  Food, Clothing or Shelter Needs: No; Support System Lacking: No; Transportation Concerns: No Electronic Signature(s) Signed: 01/25/2022 5:18:47 PM By: Duanne Guess MD FACS Signed: 01/25/2022 5:19:00 PM By: Samuella Bruin Entered By: Duanne Guess on 01/25/2022 17:07:59 -------------------------------------------------------------------------------- SuperBill Details Patient Name: Date of Service: Anthony Barnes, Anthony [name] 01/25/2022 Medical Record Number: [id number] Patient Account Number: [id number] Date of Birth/Sex: Treating RN: Dec 06, 1955 (66 y.o. [name] Primary Care Provider: Hoy Register Other Clinician: Referring Provider: Treating Provider/Extender: Camillo Flaming Weeks in Treatment: 3 Diagnosis Coding ICD-10 Codes Code Description [phone] Non-pressure chronic ulcer of other part of right lower leg with fat layer exposed E11.622 Type 2 diabetes mellitus with other skin ulcer E11.9 Type 2 diabetes mellitus without complications I10 Essential (primary) hypertension I87.2 Venous insufficiency (chronic) (peripheral) Facility Procedures CPT4 Code: [id number] 1 Description: 1042 - DEB SUBQ TISSUE 20 SQ CM/< ICD-10 Diagnosis Description 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 Modifier: osed Quantity: 1 Physician Procedures : CPT4 Code Description Modifier [id number] - WC PHYS LEVEL 3 - EST PT 25 ICD-10 Diagnosis Description 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osed E11.622 Type 2 diabetes mellitus with other skin ulcer  E11.9 Type 2 diabetes mellitus without complications I87.2 Venous insufficiency (chronic) (peripheral) Quantity: 1 : [id number] 11042 - WC PHYS SUBQ TISS 20 SQ CM 1 ICD-10 Diagnosis Description 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osed Quantity: Electronic Signature(s) Signed:  01/25/2022 5:12:40 PM By: Duanne Guess MD FACS Entered By: Duanne Guess on 01/25/2022 17:12:40

## 2022-01-25 NOTE — Progress Notes (Signed)
DELMONT, PROSCH ([id number]) Visit Report for 01/25/2022 Arrival Information Details Patient Name: Date of Service: Anthony Barnes 01/25/2022 3:45 PM Medical Record Number: [id number] Patient Account Number: [id number] Date of Birth/Sex: Treating RN: November 30, 1955 (66 y.o. [name] Primary Care Wylodean Shimmel: Hoy Register Other Clinician: Referring Maddix Kliewer: Treating Spyros Winch/Extender: Camillo Flaming Weeks in Treatment: 3 Visit Information History Since Last Visit Added or deleted any medications: No Patient Arrived: Ambulatory Any new allergies or adverse reactions: No Arrival Time: 15:54 Had a fall or experienced change in No Accompanied By: self activities of daily living that may affect Transfer Assistance: None risk of falls: Patient Identification Verified: Yes Signs or symptoms of abuse/neglect since last visito No Secondary Verification Process Completed: Yes Hospitalized since last visit: No Patient Requires Transmission-Based Precautions: No Implantable device outside of the clinic excluding No cellular tissue based products placed in the center since last visit: Has Dressing in Place as Prescribed: Yes Has Compression in Place as Prescribed: Yes Pain Present Now: No Electronic Signature(s) Signed: 01/25/2022 5:19:00 PM By: Samuella Bruin Entered By: Samuella Bruin on 01/25/2022 15:55:54 -------------------------------------------------------------------------------- Encounter Discharge Information Details Patient Name: Date of Service: Anthony Harbor, [name] 01/25/2022 3:45 PM Medical Record Number: [id number] Patient Account Number: [id number] Date of Birth/Sex: Treating RN: 1955/09/11 (66 y.o. [name] Primary Care Yigit Norkus: Hoy Register Other Clinician: Referring Janelle Culton: Treating Anihya Tuma/Extender: Camillo Flaming Weeks in Treatment: 3 Encounter Discharge Information Items Post  Procedure Vitals Discharge Condition: Stable Temperature (F): 98.4 Ambulatory Status: Ambulatory Pulse (bpm): 74 Discharge Destination: Home Respiratory Rate (breaths/min): 18 Transportation: Private Auto Blood Pressure (mmHg): 169/102 Accompanied By: self Schedule Follow-up Appointment: Yes Clinical Summary of Care: Patient Declined Electronic Signature(s) Signed: 01/25/2022 5:19:00 PM By: Samuella Bruin Entered By: Samuella Bruin on 01/25/2022 16:31:56 -------------------------------------------------------------------------------- Lower Extremity Assessment Details Patient Name: Date of Service: Anthony Barnes [name] 01/25/2022 3:45 PM Medical Record Number: [id number] Patient Account Number: [id number] Date of Birth/Sex: Treating RN: April 07, 1956 (66 y.o. [name] Primary Care Ronal Maybury: Hoy Register Other Clinician: Referring Raylin Diguglielmo: Treating Eliott Amparan/Extender: Camillo Flaming Weeks in Treatment: 3 Edema Assessment Assessed: [Left: No] [Right: No] E[Left: dema] [Right: :] Calf Left: Right: Point of Measurement: From Medial Instep 46.8 cm Ankle Left: Right: Point of Measurement: From Medial Instep 28.4 cm Vascular Assessment Pulses: Dorsalis Pedis Palpable: [Right:Yes] Electronic Signature(s) Signed: 01/25/2022 5:19:00 PM By: Samuella Bruin Entered By: Samuella Bruin on 01/25/2022 16:03:14 -------------------------------------------------------------------------------- Multi Wound Chart Details Patient Name: Date of Service: Anthony Harbor, [name] 01/25/2022 3:45 PM Medical Record Number: [id number] Patient Account Number: [id number] Date of Birth/Sex: Treating RN: Nov 04, 1955 (66 y.o. [name] Primary Care Trevaris Pennella: Hoy Register Other Clinician: Referring Tiquan Bouch: Treating Aneyah Lortz/Extender: Camillo Flaming Weeks in Treatment: 3 Vital Signs Height(in): 75 Pulse(bpm):  74 Weight(lbs): 310 Blood Pressure(mmHg): 169/102 Body Mass Index(BMI): 38.7 Temperature(F): 98.4 Respiratory Rate(breaths/min): 18 Photos: [N/A:N/A] Right, Lateral Lower Leg N/A N/A Wound Location: Trauma N/A N/A Wounding Event: Venous Leg Ulcer N/A N/A Primary Etiology: Hypertension, Type II Diabetes N/A N/A Comorbid History: 12/03/2021 N/A N/A Date Acquired: 3 N/A N/A Weeks of Treatment: Open N/A N/A Wound Status: No N/A N/A Wound Recurrence: 3.3x1.2x0.1 N/A N/A Measurements L x W x D (cm) 3.11 N/A N/A A (cm) : rea 0.311 N/A N/A Volume (cm) : -193.40% N/A N/A % Reduction in A rea: -193.40% N/A N/A % Reduction in Volume: Full Thickness Without Exposed N/A N/A Classification: Support Structures Medium N/A  N/A Exudate A mount: Serosanguineous N/A N/A Exudate Type: red, brown N/A N/A Exudate Color: Distinct, outline attached N/A N/A Wound Margin: Small (1-33%) N/A N/A Granulation A mount: Red N/A N/A Granulation Quality: Large (67-100%) N/A N/A Necrotic A mount: Eschar, Adherent Slough N/A N/A Necrotic Tissue: Fat Layer (Subcutaneous Tissue): Yes N/A N/A Exposed Structures: Fascia: No Tendon: No Muscle: No Joint: No Bone: No Small (1-33%) N/A N/A Epithelialization: Debridement - Excisional N/A N/A Debridement: Pre-procedure Verification/Time Out 16:14 N/A N/A Taken: Lidocaine 4% Topical Solution N/A N/A Pain Control: Subcutaneous, Slough N/A N/A Tissue Debrided: Skin/Subcutaneous Tissue N/A N/A Level: 3.96 N/A N/A Debridement A (sq cm): rea Curette N/A N/A Instrument: Minimum N/A N/A Bleeding: Pressure N/A N/A Hemostasis A chieved: 8 N/A N/A Procedural Pain: 2 N/A N/A Post Procedural Pain: Procedure was tolerated well N/A N/A Debridement Treatment Response: 3.3x1.2x0.1 N/A N/A Post Debridement Measurements L x W x D (cm) 0.311 N/A N/A Post Debridement Volume: (cm) Debridement N/A N/A Procedures Performed: Treatment  Notes Wound #2 (Lower Leg) Wound Laterality: Right, Lateral Cleanser Soap and Water Discharge Instruction: May shower and wash wound with dial antibacterial soap and water prior to dressing change. Wound Cleanser Discharge Instruction: Cleanse the wound with wound cleanser prior to applying a clean dressing using gauze sponges, not tissue or cotton balls. Peri-Wound Care Sween Lotion (Moisturizing lotion) Discharge Instruction: Apply moisturizing lotion as directed Topical Primary Dressing IODOFLEX 0.9% Cadexomer Iodine Pad 4x6 cm Discharge Instruction: Apply to wound bed as instructed Secondary Dressing Woven Gauze Sponge, Non-Sterile 4x4 in Discharge Instruction: Apply over primary dressing as directed. Secured With [employer] 4x5 (in/yd) Discharge Instruction: Secure with Coban as directed. Kerlix Roll Sterile, 4.5x3.1 (in/yd) Discharge Instruction: Secure with Kerlix as directed. 110M Medipore H Soft Cloth Surgical T ape, 4 x 10 (in/yd) Discharge Instruction: Secure with tape as directed. Compression Wrap Compression Stockings Add-Ons Electronic Signature(s) Signed: 01/25/2022 5:05:59 PM By: Duanne Guess MD FACS Signed: 01/25/2022 5:19:00 PM By: Gelene Mink By: Duanne Guess on 01/25/2022 17:05:59 -------------------------------------------------------------------------------- Multi-Disciplinary Care Plan Details Patient Name: Date of Service: Anthony Harbor, [name] 01/25/2022 3:45 PM Medical Record Number: [id number] Patient Account Number: [id number] Date of Birth/Sex: Treating RN: 06-May-1956 (66 y.o. [name] Primary Care Amarian Botero: Hoy Register Other Clinician: Referring Issai Werling: Treating Epimenio Schetter/Extender: Camillo Flaming Weeks in Treatment: 3 Active Inactive Venous Leg Ulcer Nursing Diagnoses: Actual venous Insuffiency (use after diagnosis is confirmed) Knowledge deficit related to disease process  and management Goals: Patient will maintain optimal edema control Date Initiated: 01/02/2022 Target Resolution Date: 02/03/2022 Goal Status: Active Patient/caregiver will verbalize understanding of disease process and disease management Date Initiated: 01/02/2022 Target Resolution Date: 02/03/2022 Goal Status: Active Interventions: Assess peripheral edema status every visit. Compression as ordered Provide education on venous insufficiency Treatment Activities: Non-invasive vascular studies : 01/02/2022 T ordered outside of clinic : 01/02/2022 est Therapeutic compression applied : 01/02/2022 Notes: Wound/Skin Impairment Nursing Diagnoses: Impaired tissue integrity Knowledge deficit related to ulceration/compromised skin integrity Goals: Patient/caregiver will verbalize understanding of skin care regimen Date Initiated: 01/02/2022 Target Resolution Date: 02/03/2022 Goal Status: Active Ulcer/skin breakdown will have a volume reduction of 30% by week 4 Date Initiated: 01/02/2022 Target Resolution Date: 02/03/2022 Goal Status: Active Interventions: Assess ulceration(s) every visit Provide education on ulcer and skin care Treatment Activities: Skin care regimen initiated : 01/02/2022 Topical wound management initiated : 01/02/2022 Notes: Electronic Signature(s) Signed: 01/25/2022 5:19:00 PM By: Samuella Bruin Entered By: Samuella Bruin on 01/25/2022 16:04:48 --------------------------------------------------------------------------------  Pain Assessment Details Patient Name: Date of Service: Anthony Barnes [name] 01/25/2022 3:45 PM Medical Record Number: [id number] Patient Account Number: [id number] Date of Birth/Sex: Treating RN: [birth year] (66 y.o. [name] Primary Care Alilah Mcmeans: Hoy Register Other Clinician: Referring Lilyanah Celestin: Treating Chontel Warning/Extender: Camillo Flaming Weeks in Treatment: 3 Active Problems Location of Pain Severity and  Description of Pain Patient Has Paino No Site Locations Rate the pain. Current Pain Level: 0 Pain Management and Medication Current Pain Management: Electronic Signature(s) Signed: 01/25/2022 5:19:00 PM By: Samuella Bruin Entered By: Samuella Bruin on 01/25/2022 15:55:49 -------------------------------------------------------------------------------- Patient/Caregiver Education Details Patient Name: Date of Service: Anthony Barnes 6/14/2023andnbsp3:45 PM Medical Record Number: [id number] Patient Account Number: [id number] Date of Birth/Gender: Treating RN: 26-Jul-1956 (66 y.o. [name] Primary Care Physician: Hoy Register Other Clinician: Referring Physician: Treating Physician/Extender: Camillo Flaming Weeks in Treatment: 3 Education Assessment Education Provided To: Patient Education Topics Provided Wound/Skin Impairment: Methods: Explain/Verbal Responses: Reinforcements needed, State content correctly Electronic Signature(s) Signed: 01/25/2022 5:19:00 PM By: Samuella Bruin Entered By: Samuella Bruin on 01/25/2022 16:05:01 -------------------------------------------------------------------------------- Wound Assessment Details Patient Name: Date of Service: Anthony Barnes [name] 01/25/2022 3:45 PM Medical Record Number: [id number] Patient Account Number: [id number] Date of Birth/Sex: Treating RN: 1956/01/19 (66 y.o. [name] Primary Care Sarahy Creedon: Hoy Register Other Clinician: Referring Rielly Brunn: Treating Nell Schrack/Extender: Camillo Flaming Weeks in Treatment: 3 Wound Status Wound Number: 2 Primary Etiology: Venous Leg Ulcer Wound Location: Right, Lateral Lower Leg Wound Status: Open Wounding Event: Trauma Comorbid History: Hypertension, Type II Diabetes Date Acquired: 12/03/2021 Weeks Of Treatment: 3 Clustered Wound: No Photos Wound Measurements Length: (cm) 3.3 Width: (cm)  1.2 Depth: (cm) 0.1 Area: (cm) 3.11 Volume: (cm) 0.311 % Reduction in Area: -193.4% % Reduction in Volume: -193.4% Epithelialization: Small (1-33%) Tunneling: No Undermining: No Wound Description Classification: Full Thickness Without Exposed Support Structures Wound Margin: Distinct, outline attached Exudate Amount: Medium Exudate Type: Serosanguineous Exudate Color: red, brown Foul Odor After Cleansing: No Slough/Fibrino Yes Wound Bed Granulation Amount: Small (1-33%) Exposed Structure Granulation Quality: Red Fascia Exposed: No Necrotic Amount: Large (67-100%) Fat Layer (Subcutaneous Tissue) Exposed: Yes Necrotic Quality: Eschar, Adherent Slough Tendon Exposed: No Muscle Exposed: No Joint Exposed: No Bone Exposed: No Treatment Notes Wound #2 (Lower Leg) Wound Laterality: Right, Lateral Cleanser Soap and Water Discharge Instruction: May shower and wash wound with dial antibacterial soap and water prior to dressing change. Wound Cleanser Discharge Instruction: Cleanse the wound with wound cleanser prior to applying a clean dressing using gauze sponges, not tissue or cotton balls. Peri-Wound Care Sween Lotion (Moisturizing lotion) Discharge Instruction: Apply moisturizing lotion as directed Topical Primary Dressing IODOFLEX 0.9% Cadexomer Iodine Pad 4x6 cm Discharge Instruction: Apply to wound bed as instructed Secondary Dressing Woven Gauze Sponge, Non-Sterile 4x4 in Discharge Instruction: Apply over primary dressing as directed. Secured With [employer] 4x5 (in/yd) Discharge Instruction: Secure with Coban as directed. Kerlix Roll Sterile, 4.5x3.1 (in/yd) Discharge Instruction: Secure with Kerlix as directed. 32M Medipore H Soft Cloth Surgical T ape, 4 x 10 (in/yd) Discharge Instruction: Secure with tape as directed. Compression Wrap Compression Stockings Add-Ons Electronic Signature(s) Signed: 01/25/2022 5:19:00 PM By: Samuella Bruin Entered  By: Samuella Bruin on 01/25/2022 16:06:37 -------------------------------------------------------------------------------- Vitals Details Patient Name: Date of Service: Anthony Harbor, [name] 01/25/2022 3:45 PM Medical Record Number: [id number] Patient Account Number: [id number] Date of Birth/Sex: Treating RN: 1955-12-22 (66 y.o. [name] Primary Care Gurney Balthazor: Hoy Register  Other Clinician: Referring Chazlyn Cude: Treating Bonnie Roig/Extender: Whitney Museannon, Jennifer Newlin, Enobong Weeks in Treatment: 3 Vital Signs Time Taken: 15:55 Temperature (F): 98.4 Height (in): 75 Pulse (bpm): 74 Weight (lbs): 310 Respiratory Rate (breaths/min): 18 Body Mass Index (BMI): 38.7 Blood Pressure (mmHg): 169/102 Reference Range: 80 - 120 mg / dl Electronic Signature(s) Signed: 01/25/2022 5:19:00 PM By: Samuella BruinHerrington, Taylor Entered By: Samuella BruinHerrington, Taylor on 01/25/2022 15:55:40

## 2022-02-02 ENCOUNTER — Encounter (HOSPITAL_BASED_OUTPATIENT_CLINIC_OR_DEPARTMENT_OTHER): Payer: Medicare Other | Admitting: General Surgery

## 2022-02-02 DIAGNOSIS — I872 Venous insufficiency (chronic) (peripheral): Secondary | ICD-10-CM | POA: Diagnosis not present

## 2022-02-02 DIAGNOSIS — L97812 Non-pressure chronic ulcer of other part of right lower leg with fat layer exposed: Secondary | ICD-10-CM | POA: Diagnosis not present

## 2022-02-02 DIAGNOSIS — I10 Essential (primary) hypertension: Secondary | ICD-10-CM | POA: Diagnosis not present

## 2022-02-02 DIAGNOSIS — E11622 Type 2 diabetes mellitus with other skin ulcer: Secondary | ICD-10-CM | POA: Diagnosis not present

## 2022-02-02 DIAGNOSIS — E1165 Type 2 diabetes mellitus with hyperglycemia: Secondary | ICD-10-CM | POA: Diagnosis not present

## 2022-02-02 DIAGNOSIS — S81801A Unspecified open wound, right lower leg, initial encounter: Secondary | ICD-10-CM | POA: Diagnosis not present

## 2022-02-02 NOTE — Progress Notes (Signed)
AMONTE, BROOKOVER ([id number]) Visit Report for 02/02/2022 Chief Complaint Document Details Patient Name: Date of Service: Anthony Barnes [name] 02/02/2022 9:15 A M Medical Record Number: [id number] Patient Account Number: [id number] Date of Birth/Sex: Treating RN: 1956-01-16 (66 y.o. Anthony Barnes Primary Care Provider: Hoy Register Other Clinician: Referring Provider: Treating Provider/Extender: Camillo Flaming Weeks in Treatment: 4 Information Obtained from: Patient Chief Complaint Right LE Ulcer Electronic Signature(s) Signed: 02/02/2022 9:42:58 AM By: Duanne Guess MD FACS Entered By: Duanne Guess on 02/02/2022 09:42:58 -------------------------------------------------------------------------------- Debridement Details Patient Name: Date of Service: Anthony Barnes, [name] 02/02/2022 9:15 A M Medical Record Number: [id number] Patient Account Number: [id number] Date of Birth/Sex: Treating RN: November 15, 1955 (66 y.o. Anthony Barnes Primary Care Provider: Hoy Register Other Clinician: Referring Provider: Treating Provider/Extender: Camillo Flaming Weeks in Treatment: 4 Debridement Performed for Assessment: Wound #2 Right,Lateral Lower Leg Performed By: Physician Duanne Guess, MD Debridement Type: Debridement Severity of Tissue Pre Debridement: Fat layer exposed Level of Consciousness (Pre-procedure): Awake and Alert Pre-procedure Verification/Time Out Yes - 09:37 Taken: Start Time: 09:37 Pain Control: Lidocaine 4% T opical Solution T Area Debrided (L x W): otal 3 (cm) x 1.3 (cm) = 3.9 (cm) Tissue and other material debrided: Viable, Non-Viable, Slough, Subcutaneous, Slough Level: Skin/Subcutaneous Tissue Debridement Description: Excisional Instrument: Curette Bleeding: Minimum Hemostasis Achieved: Pressure Procedural Pain: 8 Post Procedural Pain: 2 Response to Treatment: Procedure was tolerated well Level of  Consciousness (Post- Awake and Alert procedure): Post Debridement Measurements of Total Wound Length: (cm) 3 Width: (cm) 1.3 Depth: (cm) 0.1 Volume: (cm) 0.306 Character of Wound/Ulcer Post Debridement: Improved Severity of Tissue Post Debridement: Fat layer exposed Post Procedure Diagnosis Same as Pre-procedure Electronic Signature(s) Signed: 02/02/2022 9:49:43 AM By: Duanne Guess MD FACS Signed: 02/02/2022 5:07:36 PM By: Samuella Bruin Entered By: Samuella Bruin on 02/02/2022 09:38:27 -------------------------------------------------------------------------------- HPI Details Patient Name: Date of Service: Anthony Barnes, [name] 02/02/2022 9:15 A M Medical Record Number: [id number] Patient Account Number: [id number] Date of Birth/Sex: Treating RN: 1956/07/31 (66 y.o. Anthony Barnes Primary Care Provider: Hoy Register Other Clinician: Referring Provider: Treating Provider/Extender: Camillo Flaming Weeks in Treatment: 4 History of Present Illness HPI Description: ADMISSION 03/12/18 This is a 66 year old and it was a type II diabetic reasonably poorly controlled with a recent hemoglobin A1c of 8.2. He was tending to his lawn on 01/15/18 when his weed [name] sent a rock up word hitting him in the right anterior leg. He was seen in his primary M.D. office is same time. An x-ray showed no abnormality. He was given a course of cephalexin. Since then he's been applying peroxide and topical antibiotics to the wound. He does not have a history of chronic wounds however he does have chronic edema in the lower legs. He is complaining of pain in the right leg wound but no real history of claudication. Past medical history includes type 2 diabetes, hypertension, morbid obesity. ABIs in the right leg were noncompressible 03/19/18 on evaluation today patient appears to be tolerating the wrap in general fairly well. He states he's not having that much pain in regard  to the wound itself. With that being said he is having some issues with slough buildup on the surface of the wound the Iodoflex does seem to be helpful in that regard. He is still having discomfort he wonders if I can send in a prescription for ibuprofen or something of like to help him out. I do believe  that the prox end may be of benefit for him. 03/25/18 on evaluation today patient appears to be doing very well in regard to his right lateral lower [name]. He's been tolerating the dressing changes without complication that is the wraps. Nonetheless he does continue to have pain he states is been dreading the possibility of having to have debridement again today. His first debridement experience was not optimal. With that being said he does seem to be showing signs of improvement there does appear to be little bit more granulation he does have a lot of slough that really does need to breeding away. 04/04/18;the patient went for arterial studies that were really quite normal. ABI on the right at 1.19 with triphasic waveforms on the left 1.11 with triphasic waveforms. TBI 0.93 on the right and 0.95 on the left. This suggests T should be able to tolerate even 4 layer compression. He is however complaining of a lot of pain with our wraps I'm wondering whether the pain is simply Iodoflex. I changed him to [employer] I'm still going to try to keep him in 3 layer compression 04/12/18 on evaluation today patient actually appears to be doing rather well in regard to his right lower [name] ulcer. He is making good progress although it is slow still I do believe that the Santyl is much better than the Iodoflex. The good news is the patient seems to be tolerating the dressing changes without complication now that we've gotten good of the Iodoflex which really did burn him quite significantly. Otherwise there's no evidence of infection. 04/17/18 on evaluation today patient  actually appears to be showing signs of improvement in regard to the ulcer. Unfortunately he's had somewhat of a rough day due to the fact that he found out that one of his friends suddenly and unexpectedly passed today. He states he's not sure he's in the frame of mind to allow me to debride the wound at this point. Nonetheless I do feel like he is showing signs of the wound getting better little by little each week. 04/24/18 on evaluation today patient's ulcer actually appears to be showing some signs of improvement albeit slow. He has been tolerating the dressing changes without complication except for the wrap which she states was so tight that he had to remove it it was causing a lot of discomfort. Fortunately there is no evidence of infection at this point. He states he only wore the wrap until about the time he got home last week and that he had to take it off. Nonetheless he does not have any other compression stockings, Juxta-Lite wraps, or anything otherwise to really help at this point as far as compression is concerned. 05/01/18 on evaluation today patient actually appears to be doing fairly well in regard to his lower extremity ulcer. He has been tolerating the dressing changes currently without complication. The wrap does seem to be helping as far as the fluid management is concerned. Wound bed itself actually show signs of good improvement although there is some Slough noted there's not as much as previous and he actually has fairly decent granulation noted as well. Overall I'm pleased with the progress of to this point. The patient would prefer not to have any debridement today he states he's actually not feeling too well in general and he really does not want any additional discomfort typically debridement is fairly uncomfortable for him. 05/08/18 on evaluation today patient actually appears to be doing  a little better in regard to his lower extremity ulcer. He has been tolerating the  dressing changes without complication. With that being said I'm actually quite pleased with the fact the wound is not appear to be infected and in general has made a little progress. With that being said I do believe we need to perform some debridement to clean away the necrotic tissue on the surface of the wound. 05/15/18 on evaluation today patient actually appears to be doing a little better in regard to his wound. Fortunately there is some improvement noted fortunately there's also no significant evidence of infection at this point in time. He does have continued pain that really nothing different than previous he did get his Juxta-Lite wrap. 05/29/18 on evaluation today patient actually appears to be doing somewhat better in regard to his wound currently. He's been tolerating the dressing changes without complication. With that being said he has been performing [facility] changes every day at home. I'm pretty sure that we told him every other day last week or when I saw him rather two weeks ago. With that being said obviously did not hurt to do it daily just that obviously is gonna run out of supplies sooner and that could get him into trouble as far as his insurance is concerned. Nonetheless he at this point still continues to have pain although honestly I don't feel like it's as bad as what it was in the past just based on his reactions at this point. 06/12/18 on evaluation today patient actually appears to be doing better in regard to his lower [name] ulcer. He's been tolerating the dressing changes without complication. Fortunately there does not appear to be any evidence of infection at this time. No fevers, chills, nausea, or vomiting noted at this time. 06/19/18 evaluation today patient's ulcer on the lower extremity appears to be doing better at this point. he has been tolerating the dressing changes. The patient seems to be somewhat depressed about the progress of his  wound although the overall appearance at this time seems to be doing well. In general I'm very pleased with the appearance today he does have some biofilm on the surface of the wound as well as of hyper granular tissue we may want to utilize a little bit of silver nitrate today. 06/27/2018; patient comes into clinic today for a wound on the right lateral calf likely related to chronic venous insufficiency. He has been using medihoney covered with Hydrofera Blue. Wound surface actually looks quite good 07/03/2018 seen today for follow-up and management of lower extremity ulcer right lateral shin. T olerating dressing changes. He has a small amount of biofilm today. Will attempt to remove a portion of the biofilm as he tolerates. He becomes very anxious with wound treatments. He would benefit from layer compression wraps however he refuses compression wraps or anything that smokes his legs. He expressed an inability to tolerate compression wraps. Juxta lite wraps have been ordered. He has been instructed on the appropriate application of the juxta leg wraps. His blood pressure today on visit was 200/120. He denies any blurred vision, chest pain, dizziness, shortness of breath, or difficulty with mobility. Mr. Loistine SimasGladney stated that he had a recent visit with his primary care provider. At that time his carvedilol was decreased from 25 mg daily to 12.5 mg daily. Strongly encouraged Mr. Loistine SimasGladney to seek medical attention today during visit. He declined transport for evaluation of elevated blood pressure. Encouraged him to contact  his primary care provider today for medication management. He stated that he would call his primary care doctor after wound visit today. Also encouraged him that if he experienced any symptoms of blurred vision, chest pain, shortness of breath to immediately seek medical attention. 07/17/18 on evaluation today patient's wound actually does seem to show signs of improvement based on the  overall appearance of the wound bed today. There does not appear to show any signs of infection which is good news. There is no overall worsening which is also good news. He still has hyper granular tissue will use in the [employer] followed by [facility] Dressing this point. 07/31/18 on evaluation today patient's wound bed actually show signs of improvement with good epithelialization especially in the upper portion of the wound. He has been tolerating the dressing changes without complication in general. Overall I'm extremely happy with how things stand. No fevers, chills, nausea, or vomiting noted at this time. 08/28/18 on evaluation today patient appears to be doing a little better in regard to his lower extremity ulcer. With that being said it appears to be very hyper granular I think this is directly attributed to the fact that he continues to use the [employer] underneath the [facility] Dressing. Although this helps not to stick unfortunately also think he's not getting the benefit of the [facility] Dressing particular and subsequently is causing too much moisture buildup hence the hyper granulation. Fortunately there does not appear to be any signs of infection at this time which is good news. 09/03/18 on evaluation today patient appears to be doing well in regard to his lower [name] ulcer. He has been tolerating the dressing changes without complication. Fortunately he has much less hyper granular tissue the noted last week I do think using silver nitrate in changing to using the [facility] Dressing without the mepitel has made a big difference for him this is good news. 09/18/18 on evaluation today patient appears to be doing excellent in regard to his right lower [name] ulcer. This is actually significantly smaller even compared to the last evaluation. Overall I'm very pleased in this regard. I'm a recommend that we likely repeat the silver nitrate today based on what I'm  seeing. 10/02/18 on evaluation today patient appears to be doing excellent in regard to his lower extremity wound on the right. He's been tolerating the dressing changes without complication. Fortunately there's no signs of infection at this time. Overall been very pleased with how things seem to be progressing. No fevers, chills, nausea, or vomiting noted at this time. 10/16/18 on evaluation today patient actually appears to be doing much better in regard to his lower extremity wound on the right. This is shown signs of good improvement and is indeed measuring smaller he is on a excellent track as far as healing is concerned. My hope is this will be healed of the next several weeks. Fortunately there is no evidence of infection at this time. He does seem to be doing everything that I'm recommending for him 10/30/18 on evaluation today patient appears to be doing better in regard to his lower extremity ulcer. He's been tolerating the dressing changes without complication. Fortunately the Hydrofera Blue Dressing to be doing the job. He has made excellent progress. With that being said this is still going somewhat slow but nonetheless is always making good progress at this point. 5/18-Patient was in to be seen for right leg ulcer that appeared after scab above it was removed today.  This area had healed completely. 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 The wound has not healed though it has been nearly 2 months since the injury occurred. He was seen in the emergency department at [facility] on May 9. At that time it was very painful and he described it as a "15 on a scale of 010". He also was found to have 3+ pitting edema to the  bilateral lower extremities. He was prescribed a weeks course of Keflex. He is here today because the wound has failed to heal and he has a prior history in our clinic. ABI in clinic today was noncompressible. He did have formal vascular studies performed in 2019 which were normal. The last hemoglobin A1c I have available for review was from March 21, 2021 and was elevated at 7.7%. The wound is fairly small and circular located about 3 inches above his right lateral malleolus. It is completely covered with eschar. No surrounding erythema, no odor, no purulent drainage. 01/11/2022: The wound on his right lateral leg is a little bit smaller today. It has reaccumulated some slough and a small bit of eschar. It remains fairly painful. He has another area on his anterior tibia that he will not let me touch but I am concerned that there is a wound forming underneath the surface. 01/18/2022: Unfortunately, his wound is a little bit bigger today. He continues to accumulate slough on the wound surface. It is still quite tender. 01/25/2022: The patient bumped his wound on the car door which resulted in bleeding. He was seen at urgent care where it was redressed. Unfortunately, the wound is bigger again today. There is accumulated slough on the surface. Urgent care gave him some tramadol, apparently and he took this prior to his visit so he could tolerate debridement better. 02/02/2022: The wound is a little bit smaller today. The surface, however, has accumulated a fairly thick layer of slough. The periwound skin is intact and there is no obvious sign of infection. Electronic Signature(s) Signed: 02/02/2022 9:43:34 AM By: Duanne Guess MD FACS Entered By: Duanne Guess on 02/02/2022 09:43:33 -------------------------------------------------------------------------------- Physical Exam Details Patient Name: Date of Service: Anthony Barnes, [name] 02/02/2022 9:15 A M Medical Record Number: [id number] Patient  Account Number: [id number] Date of Birth/Sex: Treating RN: Nov 14, 1955 (66 y.o. Anthony Barnes Primary Care Provider: Hoy Register Other Clinician: Referring Provider: Treating Provider/Extender: Camillo Flaming Weeks in Treatment: 4 Constitutional Hypertensive, asymptomatic. . . . No acute distress.Marland Kitchen Respiratory Normal work of breathing on room air.. Notes 02/02/2022: The wound is a little bit smaller today. The surface, however, has accumulated a fairly thick layer of slough. The periwound skin is intact and there is no obvious sign of infection. Electronic Signature(s) Signed: 02/02/2022 9:44:33 AM By: Duanne Guess MD FACS Entered By: Duanne Guess on 02/02/2022 09:44:33 -------------------------------------------------------------------------------- Physician Orders Details Patient Name: Date of Service: Anthony Barnes, [name] 02/02/2022 9:15 A M Medical Record Number: [id number] Patient Account Number: [id number] Date of Birth/Sex: Treating RN: 11-13-1955 (66 y.o. Anthony Barnes Primary Care Provider: Hoy Register Other Clinician: Referring Provider: Treating Provider/Extender: Camillo Flaming Weeks in Treatment: 4 Verbal / Phone Orders: No Diagnosis Coding ICD-10 Coding  Code Description [phone] Non-pressure chronic ulcer of other part of right lower leg with fat layer exposed E11.622 Type 2 diabetes mellitus with other skin ulcer E11.9 Type 2 diabetes mellitus without complications I10 Essential (primary) hypertension I87.2 Venous insufficiency (chronic) (peripheral) Follow-up Appointments ppointment in 1 week. - Dr. Lady Gary - Room 2 - 6/28 at 2:45 PM Return A Bathing/ Shower/ Hygiene May shower with protection but do not get wound dressing(s) wet. - may purchase a cast protector from [facility] or [facility] Edema Control - Lymphedema / SCD / Other Elevate legs to the level of the heart or above for 30 minutes daily and/or  when sitting, a frequency of: Avoid standing for long periods of time. Patient to wear own compression stockings every day. Exercise regularly Moisturize legs daily. Compression stocking or Garment 20-30 mm/Hg pressure to: - to L left until R leg is healed Wound Treatment Wound #2 - Lower Leg Wound Laterality: Right, Lateral Cleanser: Normal Saline 1 x Per Week/30 Days Discharge Instructions: Cleanse the wound with Normal Saline prior to applying a clean dressing using gauze sponges, not tissue or cotton balls. Cleanser: Soap and Water 1 x Per Week/30 Days Discharge Instructions: May shower and wash wound with dial antibacterial soap and water prior to dressing change. Cleanser: Wound Cleanser 1 x Per Week/30 Days Discharge Instructions: Cleanse the wound with wound cleanser prior to applying a clean dressing using gauze sponges, not tissue or cotton balls. Peri-Wound Care: Sween Lotion (Moisturizing lotion) 1 x Per Week/30 Days Discharge Instructions: Apply moisturizing lotion as directed Prim Dressing: Santyl Ointment 1 x Per Week/30 Days ary Discharge Instructions: Apply nickel thick amount to wound bed as instructed Secondary Dressing: Woven Gauze Sponge, Non-Sterile 4x4 in 1 x Per Week/30 Days Discharge Instructions: Moisten with saline and apply over primary dressing as directed. Secured With: Coban Self-Adherent Wrap 4x5 (in/yd) 1 x Per Week/30 Days Discharge Instructions: Secure with Coban as directed. Secured With: [employer], 4.5x3.1 (in/yd) 1 x Per Week/30 Days Discharge Instructions: Secure with Kerlix as directed. Secured With: 69M Medipore H Soft Cloth Surgical T ape, 4 x 10 (in/yd) 1 x Per Week/30 Days Discharge Instructions: Secure with tape as directed. Patient Medications llergies: Shellfish Containing Products, codeine A Notifications Medication Indication Start End 02/02/2022 lidocaine DOSE topical 4 % cream - cream topical Electronic Signature(s) Signed:  02/02/2022 9:46:19 AM By: Duanne Guess MD FACS Entered By: Duanne Guess on 02/02/2022 09:46:19 -------------------------------------------------------------------------------- Problem List Details Patient Name: Date of Service: Anthony Barnes, [name] 02/02/2022 9:15 A M Medical Record Number: [id number] Patient Account Number: [id number] Date of Birth/Sex: Treating RN: 10-27-1955 (66 y.o. Anthony Barnes Primary Care Provider: Hoy Register Other Clinician: Referring Provider: Treating Provider/Extender: Camillo Flaming Weeks in Treatment: 4 Active Problems ICD-10 Encounter Code Description Active Date MDM Diagnosis [phone] Non-pressure chronic ulcer of other part of right lower leg with fat layer 01/02/2022 No Yes exposed E11.622 Type 2 diabetes mellitus with other skin ulcer 01/02/2022 No Yes E11.9 Type 2 diabetes mellitus without complications 01/02/2022 No Yes I10 Essential (primary) hypertension 01/02/2022 No Yes I87.2 Venous insufficiency (chronic) (peripheral) 01/02/2022 No Yes Inactive Problems Resolved Problems Electronic Signature(s) Signed: 02/02/2022 9:42:40 AM By: Duanne Guess MD FACS Entered By: Duanne Guess on 02/02/2022 09:42:40 -------------------------------------------------------------------------------- Progress Note Details Patient Name: Date of Service: Anthony Barnes, [name] 02/02/2022 9:15 A M Medical Record Number: [id number] Patient Account Number: [id number] Date of Birth/Sex: Treating RN: October 05, 1955 (66 y.o. Anthony Barnes Primary Care  Provider: Hoy Register Other Clinician: Referring Provider: Treating Provider/Extender: Camillo Flaming Weeks in Treatment: 4 Subjective Chief Complaint Information obtained from Patient Right LE Ulcer History of Present Illness (HPI) ADMISSION 03/12/18 This is a 66 year old and it was a type II diabetic reasonably poorly controlled with a recent  hemoglobin A1c of 8.2. He was tending to his lawn on 01/15/18 when his weed [name] sent a rock up word hitting him in the right anterior leg. He was seen in his primary M.D. office is same time. An x-ray showed no abnormality. He was given a course of cephalexin. Since then he's been applying peroxide and topical antibiotics to the wound. He does not have a history of chronic wounds however he does have chronic edema in the lower legs. He is complaining of pain in the right leg wound but no real history of claudication. Past medical history includes type 2 diabetes, hypertension, morbid obesity. ABIs in the right leg were noncompressible 03/19/18 on evaluation today patient appears to be tolerating the wrap in general fairly well. He states he's not having that much pain in regard to the wound itself. With that being said he is having some issues with slough buildup on the surface of the wound the Iodoflex does seem to be helpful in that regard. He is still having discomfort he wonders if I can send in a prescription for ibuprofen or something of like to help him out. I do believe that the prox end may be of benefit for him. 03/25/18 on evaluation today patient appears to be doing very well in regard to his right lateral lower [name]. He's been tolerating the dressing changes without complication that is the wraps. Nonetheless he does continue to have pain he states is been dreading the possibility of having to have debridement again today. His first debridement experience was not optimal. With that being said he does seem to be showing signs of improvement there does appear to be little bit more granulation he does have a lot of slough that really does need to breeding away. 04/04/18;the patient went for arterial studies that were really quite normal. ABI on the right at 1.19 with triphasic waveforms on the left 1.11 with triphasic waveforms. TBI 0.93 on the right and 0.95 on the left.  This suggests T should be able to tolerate even 4 layer compression. He is however complaining of a lot of pain with our wraps I'm wondering whether the pain is simply Iodoflex. I changed him to [employer] I'm still going to try to keep him in 3 layer compression 04/12/18 on evaluation today patient actually appears to be doing rather well in regard to his right lower [name] ulcer. He is making good progress although it is slow still I do believe that the Santyl is much better than the Iodoflex. The good news is the patient seems to be tolerating the dressing changes without complication now that we've gotten good of the Iodoflex which really did burn him quite significantly. Otherwise there's no evidence of infection. 04/17/18 on evaluation today patient actually appears to be showing signs of improvement in regard to the ulcer. Unfortunately he's had somewhat of a rough day due to the fact that he found out that one of his friends suddenly and unexpectedly passed today. He states he's not sure he's in the frame of mind to allow me to debride the wound at this point. Nonetheless I do feel like he is showing  signs of the wound getting better little by little each week. 04/24/18 on evaluation today patient's ulcer actually appears to be showing some signs of improvement albeit slow. He has been tolerating the dressing changes without complication except for the wrap which she states was so tight that he had to remove it it was causing a lot of discomfort. Fortunately there is no evidence of infection at this point. He states he only wore the wrap until about the time he got home last week and that he had to take it off. Nonetheless he does not have any other compression stockings, Juxta-Lite wraps, or anything otherwise to really help at this point as far as compression is concerned. 05/01/18 on evaluation today patient actually appears to be doing fairly well in regard to his lower  extremity ulcer. He has been tolerating the dressing changes currently without complication. The wrap does seem to be helping as far as the fluid management is concerned. Wound bed itself actually show signs of good improvement although there is some Slough noted there's not as much as previous and he actually has fairly decent granulation noted as well. Overall I'm pleased with the progress of to this point. The patient would prefer not to have any debridement today he states he's actually not feeling too well in general and he really does not want any additional discomfort typically debridement is fairly uncomfortable for him. 05/08/18 on evaluation today patient actually appears to be doing a little better in regard to his lower extremity ulcer. He has been tolerating the dressing changes without complication. With that being said I'm actually quite pleased with the fact the wound is not appear to be infected and in general has made a little progress. With that being said I do believe we need to perform some debridement to clean away the necrotic tissue on the surface of the wound. 05/15/18 on evaluation today patient actually appears to be doing a little better in regard to his wound. Fortunately there is some improvement noted fortunately there's also no significant evidence of infection at this point in time. He does have continued pain that really nothing different than previous he did get his Juxta-Lite wrap. 05/29/18 on evaluation today patient actually appears to be doing somewhat better in regard to his wound currently. He's been tolerating the dressing changes without complication. With that being said he has been performing the [facility] Dressing changes every day at home. I'm pretty sure that we told him every other day last week or when I saw him rather two weeks ago. With that being said obviously did not hurt to do it daily just that obviously is gonna run out of supplies sooner and  that could get him into trouble as far as his insurance is concerned. Nonetheless he at this point still continues to have pain although honestly I don't feel like it's as bad as what it was in the past just based on his reactions at this point. 06/12/18 on evaluation today patient actually appears to be doing better in regard to his lower [name] ulcer. He's been tolerating the dressing changes without complication. Fortunately there does not appear to be any evidence of infection at this time. No fevers, chills, nausea, or vomiting noted at this time. 06/19/18 evaluation today patient's ulcer on the lower extremity appears to be doing better at this point. he has been tolerating the dressing changes. The patient seems to be somewhat depressed about the progress of his wound although  the overall appearance at this time seems to be doing well. In general I'm very pleased with the appearance today he does have some biofilm on the surface of the wound as well as of hyper granular tissue we may want to utilize a little bit of silver nitrate today. 06/27/2018; patient comes into clinic today for a wound on the right lateral calf likely related to chronic venous insufficiency. He has been using medihoney covered with Hydrofera Blue. Wound surface actually looks quite good 07/03/2018 seen today for follow-up and management of lower extremity ulcer right lateral shin. T olerating dressing changes. He has a small amount of biofilm today. Will attempt to remove a portion of the biofilm as he tolerates. He becomes very anxious with wound treatments. He would benefit from layer compression wraps however he refuses compression wraps or anything that smokes his legs. He expressed an inability to tolerate compression wraps. Juxta lite wraps have been ordered. He has been instructed on the appropriate application of the juxta leg wraps. His blood pressure today on visit was 200/120. He denies any blurred vision, chest  pain, dizziness, shortness of breath, or difficulty with mobility. Mr. Bressman stated that he had a recent visit with his primary care provider. At that time his carvedilol was decreased from 25 mg daily to 12.5 mg daily. Strongly encouraged Mr. Fecteau to seek medical attention today during visit. He declined transport for evaluation of elevated blood pressure. Encouraged him to contact his primary care provider today for medication management. He stated that he would call his primary care doctor after wound visit today. Also encouraged him that if he experienced any symptoms of blurred vision, chest pain, shortness of breath to immediately seek medical attention. 07/17/18 on evaluation today patient's wound actually does seem to show signs of improvement based on the overall appearance of the wound bed today. There does not appear to show any signs of infection which is good news. There is no overall worsening which is also good news. He still has hyper granular tissue will use in the [employer] followed by [facility] Dressing this point. 07/31/18 on evaluation today patient's wound bed actually show signs of improvement with good epithelialization especially in the upper portion of the wound. He has been tolerating the dressing changes without complication in general. Overall I'm extremely happy with how things stand. No fevers, chills, nausea, or vomiting noted at this time. 08/28/18 on evaluation today patient appears to be doing a little better in regard to his lower extremity ulcer. With that being said it appears to be very hyper granular I think this is directly attributed to the fact that he continues to use the [employer] underneath the [facility] Dressing. Although this helps not to stick unfortunately also think he's not getting the benefit of the [facility] Dressing particular and subsequently is causing too much moisture buildup hence the hyper granulation. Fortunately there does not  appear to be any signs of infection at this time which is good news. 09/03/18 on evaluation today patient appears to be doing well in regard to his lower [name] ulcer. He has been tolerating the dressing changes without complication. Fortunately he has much less hyper granular tissue the noted last week I do think using silver nitrate in changing to using the [facility] Dressing without the mepitel has made a big difference for him this is good news. 09/18/18 on evaluation today patient appears to be doing excellent in regard to his right lower [name] ulcer.  This is actually significantly smaller even compared to the last evaluation. Overall I'm very pleased in this regard. I'm a recommend that we likely repeat the silver nitrate today based on what I'm seeing. 10/02/18 on evaluation today patient appears to be doing excellent in regard to his lower extremity wound on the right. He's been tolerating the dressing changes without complication. Fortunately there's no signs of infection at this time. Overall been very pleased with how things seem to be progressing. No fevers, chills, nausea, or vomiting noted at this time. 10/16/18 on evaluation today patient actually appears to be doing much better in regard to his lower extremity wound on the right. This is shown signs of good improvement and is indeed measuring smaller he is on a excellent track as far as healing is concerned. My hope is this will be healed of the next several weeks. Fortunately there is no evidence of infection at this time. He does seem to be doing everything that I'm recommending for him 10/30/18 on evaluation today patient appears to be doing better in regard to his lower extremity ulcer. He's been tolerating the dressing changes without complication. Fortunately the Hydrofera Blue Dressing to be doing the job. He has made excellent progress. With that being said this is still going somewhat slow but nonetheless is always making good  progress at this point. 5/18-Patient was in to be seen for right leg ulcer that appeared after scab above it was removed today. This area had healed completely. 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 The wound has not healed though it has been nearly 2 months since the injury occurred. He was seen in the emergency department at [facility] on May 9. At that time it was very painful and he described it as a "15 on a scale of 0oo10". He also was found to have 3+ pitting edema to the bilateral lower extremities. He was prescribed a weeks course of Keflex. He is here today because the wound has failed to heal and he has a prior history in our clinic. ABI in clinic today was noncompressible. He did have formal vascular studies performed in 2019 which were normal. The last hemoglobin A1c I have available for review was from March 21, 2021 and was elevated at 7.7%. The wound is fairly small and circular located about 3 inches above his right lateral malleolus. It is completely covered with eschar. No surrounding erythema, no odor, no purulent drainage. 01/11/2022: The wound on his right lateral leg is a little bit smaller today. It has reaccumulated some slough and a small bit of eschar. It remains fairly painful. He has another area on his anterior tibia that he will not let me touch but I am concerned that there is a wound forming underneath the surface. 01/18/2022: Unfortunately, his wound is a little bit bigger today. He continues to accumulate slough on the wound surface. It is still quite tender. 01/25/2022: The patient bumped his wound on the car door which resulted in bleeding. He was seen at urgent care where it was  redressed. Unfortunately, the wound is bigger again today. There is accumulated slough on the surface. Urgent care gave him some tramadol, apparently and he took this  prior to his visit so he could tolerate debridement better. 02/02/2022: The wound is a little bit smaller today. The surface, however, has accumulated a fairly thick layer of slough. The periwound skin is intact and there is no obvious sign of infection. Patient History Information obtained from Patient. Family History Cancer - Mother, Diabetes - Mother,Father, Hypertension - Mother,Father, Kidney Disease - Siblings, Stroke - Father, No family history of Heart Disease, Hereditary Spherocytosis, Lung Disease, Seizures, Thyroid Problems, Tuberculosis. Social History Former smoker - ended on 08/14/1990, Marital Status - Married, Alcohol Use - Moderate, Drug Use - No History, Caffeine Use - Daily. Medical History Eyes Denies history of Cataracts, Glaucoma, Optic Neuritis Ear/Nose/Mouth/Throat Denies history of Chronic sinus problems/congestion, Middle ear problems Hematologic/Lymphatic Denies history of Anemia, Hemophilia, Human Immunodeficiency Virus, Lymphedema, Sickle Cell Disease Respiratory Denies history of Aspiration, Asthma, Chronic Obstructive Pulmonary Disease (COPD), Pneumothorax, Sleep Apnea, Tuberculosis Cardiovascular Patient has history of Hypertension Denies history of Angina, Arrhythmia, Congestive Heart Failure, Coronary Artery Disease, Deep Vein Thrombosis, Hypotension, Myocardial Infarction, Peripheral Arterial Disease, Peripheral Venous Disease, Phlebitis, Vasculitis Gastrointestinal Denies history of Cirrhosis , Colitis, Crohnoos, Hepatitis A, Hepatitis B, Hepatitis C Endocrine Patient has history of Type II Diabetes Genitourinary Denies history of End Stage Renal Disease Immunological Denies history of Lupus Erythematosus, Raynaudoos, Scleroderma Integumentary (Skin) Denies history of History of  Burn Musculoskeletal Denies history of Gout, Rheumatoid Arthritis, Osteoarthritis, Osteomyelitis Neurologic Denies history of Dementia, Neuropathy, Quadriplegia, Paraplegia, Seizure Disorder Oncologic Denies history of Received Chemotherapy, Received Radiation Psychiatric Denies history of Anorexia/bulimia, Confinement Anxiety Hospitalization/Surgery History - esophagogastroduodenoscopy. - brain aneurysm surgery. Medical A Surgical History Notes nd Constitutional Symptoms (General Health) obesity Gastrointestinal diverticulitis Neurologic neuropathy Objective Constitutional Hypertensive, asymptomatic. No acute distress.. Vitals Time Taken: 9:24 AM, Height: 75 in, Weight: 310 lbs, BMI: 38.7, Temperature: 98.1 F, Pulse: 76 bpm, Respiratory Rate: 18 breaths/min, Blood Pressure: 150/90 mmHg. Respiratory Normal work of breathing on room air.. General Notes: 02/02/2022: The wound is a little bit smaller today. The surface, however, has accumulated a fairly thick layer of slough. The periwound skin is intact and there is no obvious sign of infection. Integumentary (Hair, Skin) Wound #2 status is Open. Original cause of wound was Trauma. The date acquired was: 12/03/2021. The wound has been in treatment 4 weeks. The wound is located on the Right,Lateral Lower Leg. The wound measures 3cm length x 1.3cm width x 0.1cm depth; 3.063cm^2 area and 0.306cm^3 volume. There is Fat Layer (Subcutaneous Tissue) exposed. There is no tunneling or undermining noted. There is a medium amount of serosanguineous drainage noted. The wound margin is distinct with the outline attached to the wound base. There is small (1-33%) red granulation within the wound bed. There is a large (67-100%) amount of necrotic tissue within the wound bed including Eschar and Adherent Slough. Assessment Active Problems ICD-10 Non-pressure chronic ulcer of other part of right lower leg with fat layer exposed Type 2 diabetes  mellitus with other skin ulcer Type 2 diabetes mellitus without complications Essential (primary) hypertension Venous insufficiency (chronic) (peripheral) Procedures Wound #2 Pre-procedure diagnosis of Wound #2 is a Venous Leg Ulcer located on the Right,Lateral Lower Leg .Severity of Tissue Pre Debridement is: Fat layer exposed. There was a Excisional Skin/Subcutaneous Tissue Debridement with a total area of 3.9 sq cm performed by Duanne Guess, MD. With the following instrument(s): Curette to remove Viable and Non-Viable tissue/material. Material removed includes Subcutaneous Tissue and Slough and after achieving pain control using Lidocaine 4% T opical Solution. No  specimens were taken. A time out was conducted at 09:37, prior to the start of the procedure. A Minimum amount of bleeding was controlled with Pressure. The procedure was tolerated well with a pain level of 8 throughout and a pain level of 2 following the procedure. Post Debridement Measurements: 3cm length x 1.3cm width x 0.1cm depth; 0.306cm^3 volume. Character of Wound/Ulcer Post Debridement is improved. Severity of Tissue Post Debridement is: Fat layer exposed. Post procedure Diagnosis Wound #2: Same as Pre-Procedure Plan Follow-up Appointments: Return Appointment in 1 week. - Dr. Lady Gary - Room 2 - 6/28 at 2:45 PM Bathing/ Shower/ Hygiene: May shower with protection but do not get wound dressing(s) wet. - may purchase a cast protector from [facility] or [facility] Edema Control - Lymphedema / SCD / Other: Elevate legs to the level of the heart or above for 30 minutes daily and/or when sitting, a frequency of: Avoid standing for long periods of time. Patient to wear own compression stockings every day. Exercise regularly Moisturize legs daily. Compression stocking or Garment 20-30 mm/Hg pressure to: - to L left until R leg is healed The following medication(s) was prescribed: lidocaine topical 4 % cream cream topical was  prescribed at facility WOUND #2: - Lower Leg Wound Laterality: Right, Lateral Cleanser: Normal Saline 1 x Per Week/30 Days Discharge Instructions: Cleanse the wound with Normal Saline prior to applying a clean dressing using gauze sponges, not tissue or cotton balls. Cleanser: Soap and Water 1 x Per Week/30 Days Discharge Instructions: May shower and wash wound with dial antibacterial soap and water prior to dressing change. Cleanser: Wound Cleanser 1 x Per Week/30 Days Discharge Instructions: Cleanse the wound with wound cleanser prior to applying a clean dressing using gauze sponges, not tissue or cotton balls. Peri-Wound Care: Sween Lotion (Moisturizing lotion) 1 x Per Week/30 Days Discharge Instructions: Apply moisturizing lotion as directed Prim Dressing: Santyl Ointment 1 x Per Week/30 Days ary Discharge Instructions: Apply nickel thick amount to wound bed as instructed Secondary Dressing: Woven Gauze Sponge, Non-Sterile 4x4 in 1 x Per Week/30 Days Discharge Instructions: Moisten with saline and apply over primary dressing as directed. Secured With: Coban Self-Adherent Wrap 4x5 (in/yd) 1 x Per Week/30 Days Discharge Instructions: Secure with Coban as directed. Secured With: [employer], 4.5x3.1 (in/yd) 1 x Per Week/30 Days Discharge Instructions: Secure with Kerlix as directed. Secured With: 6M Medipore H Soft Cloth Surgical T ape, 4 x 10 (in/yd) 1 x Per Week/30 Days Discharge Instructions: Secure with tape as directed. 02/02/2022: The wound is a little bit smaller today. The surface, however, has accumulated a fairly thick layer of slough. The periwound skin is intact and there is no obvious sign of infection. I used a curette to debride the slough and nonviable subcu tissue from the wound. We have not been getting adequate chemical debridement with Iodoflex and therefore I am going to change his dressing to Santyl with saline moistened gauze packing. Continue Kerlix and Coban  wrap. Follow-up in 1 week. Electronic Signature(s) Signed: 02/02/2022 9:47:16 AM By: Duanne Guess MD FACS Signed: 02/02/2022 9:47:16 AM By: Duanne Guess MD FACS Entered By: Duanne Guess on 02/02/2022 09:47:16 -------------------------------------------------------------------------------- HxROS Details Patient Name: Date of Service: Anthony Barnes, [name] 02/02/2022 9:15 A M Medical Record Number: [id number] Patient Account Number: [id number] Date of Birth/Sex: Treating RN: 27-Jul-1956 (66 y.o. Anthony Barnes Primary Care Provider: Hoy Register Other Clinician: Referring Provider: Treating Provider/Extender: Camillo Flaming Weeks in Treatment: 4 Information Obtained  From Patient Constitutional Symptoms (General Health) Medical History: Past Medical History Notes: obesity Eyes Medical History: Negative for: Cataracts; Glaucoma; Optic Neuritis Ear/Nose/Mouth/Throat Medical History: Negative for: Chronic sinus problems/congestion; Middle ear problems Hematologic/Lymphatic Medical History: Negative for: Anemia; Hemophilia; Human Immunodeficiency Virus; Lymphedema; Sickle Cell Disease Respiratory Medical History: Negative for: Aspiration; Asthma; Chronic Obstructive Pulmonary Disease (COPD); Pneumothorax; Sleep Apnea; Tuberculosis Cardiovascular Medical History: Positive for: Hypertension Negative for: Angina; Arrhythmia; Congestive Heart Failure; Coronary Artery Disease; Deep Vein Thrombosis; Hypotension; Myocardial Infarction; Peripheral Arterial Disease; Peripheral Venous Disease; Phlebitis; Vasculitis Gastrointestinal Medical History: Negative for: Cirrhosis ; Colitis; Crohns; Hepatitis A; Hepatitis B; Hepatitis C Past Medical History Notes: diverticulitis Endocrine Medical History: Positive for: Type II Diabetes Time with diabetes: 8 years Treated with: Oral agents Blood sugar tested every day: No Genitourinary Medical  History: Negative for: End Stage Renal Disease Immunological Medical History: Negative for: Lupus Erythematosus; Raynauds; Scleroderma Integumentary (Skin) Medical History: Negative for: History of Burn Musculoskeletal Medical History: Negative for: Gout; Rheumatoid Arthritis; Osteoarthritis; Osteomyelitis Neurologic Medical History: Negative for: Dementia; Neuropathy; Quadriplegia; Paraplegia; Seizure Disorder Past Medical History Notes: neuropathy Oncologic Medical History: Negative for: Received Chemotherapy; Received Radiation Psychiatric Medical History: Negative for: Anorexia/bulimia; Confinement Anxiety Immunizations Pneumococcal Vaccine: Received Pneumococcal Vaccination: No Immunization Notes: tetanus 2 years ago Implantable Devices None Hospitalization / Surgery History Type of Hospitalization/Surgery esophagogastroduodenoscopy brain aneurysm surgery Family and Social History Cancer: Yes - Mother; Diabetes: Yes - Mother,Father; Heart Disease: No; Hereditary Spherocytosis: No; Hypertension: Yes - Mother,Father; Kidney Disease: Yes - Siblings; Lung Disease: No; Seizures: No; Stroke: Yes - Father; Thyroid Problems: No; Tuberculosis: No; Former smoker - ended on 08/14/1990; Marital Status - Married; Alcohol Use: Moderate; Drug Use: No History; Caffeine Use: Daily; Financial Concerns: No; Food, Clothing or Shelter Needs: No; Support System Lacking: No; Transportation Concerns: No Electronic Signature(s) Signed: 02/02/2022 9:49:43 AM By: Duanne Guess MD FACS Signed: 02/02/2022 5:07:36 PM By: Gelene Mink By: Duanne Guess on 02/02/2022 09:43:51 -------------------------------------------------------------------------------- SuperBill Details Patient Name: Date of Service: Anthony Barnes, [name] 02/02/2022 Medical Record Number: [id number] Patient Account Number: [id number] Date of Birth/Sex: Treating RN: 14-May-1956 (66 y.o. Anthony Barnes Primary Care Provider: Hoy Register Other Clinician: Referring Provider: Treating Provider/Extender: Camillo Flaming Weeks in Treatment: 4 Diagnosis Coding ICD-10 Codes Code Description [phone] Non-pressure chronic ulcer of other part of right lower leg with fat layer exposed E11.622 Type 2 diabetes mellitus with other skin ulcer E11.9 Type 2 diabetes mellitus without complications I10 Essential (primary) hypertension I87.2 Venous insufficiency (chronic) (peripheral) Facility Procedures CPT4 Code: [id number] Description: 11042 - DEB SUBQ TISSUE 20 SQ CM/< ICD-10 Diagnosis Description 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 Modifier: osed Quantity: 1 Physician Procedures : CPT4 Code Description Modifier [id number] - WC PHYS LEVEL 3 - EST PT 25 ICD-10 Diagnosis Description 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osed E11.622 Type 2 diabetes mellitus with other skin ulcer  I10 Essential (primary) hypertension I87.2 Venous insufficiency (chronic) (peripheral) Quantity: 1 : [id number] 11042 - WC PHYS SUBQ TISS 20 SQ CM ICD-10 Diagnosis Description 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osed Quantity: 1 Electronic Signature(s) Signed: 02/02/2022 9:49:04 AM By: Duanne Guess MD FACS Entered By: Duanne Guess on 02/02/2022 09:49:04

## 2022-02-02 NOTE — Progress Notes (Signed)
FEDOR, KAZMIERSKI ([id number]) Visit Report for 02/02/2022 Arrival Information Details Patient Name: Date of Service: [name] 02/02/2022 9:15 A M Medical Record Number: [id number] Patient Account Number: [id number] Date of Birth/Sex: Treating RN: 09-03-1955 (66 y.o. [name] Primary Care Provider: Charlott Rakes Other Clinician: Referring Provider: Treating Provider/Extender: Carter Kitten Weeks in Treatment: 4 Visit Information History Since Last Visit Added or deleted any medications: No Patient Arrived: Ambulatory Any new allergies or adverse reactions: No Arrival Time: 09:21 Had a fall or experienced change in No Accompanied By: self activities of daily living that may affect Transfer Assistance: None risk of falls: Patient Identification Verified: Yes Signs or symptoms of abuse/neglect since last visito No Secondary Verification Process Completed: Yes Hospitalized since last visit: No Patient Requires Transmission-Based Precautions: No Implantable device outside of the clinic excluding No cellular tissue based products placed in the center since last visit: Has Dressing in Place as Prescribed: Yes Has Compression in Place as Prescribed: Yes Pain Present Now: Yes Electronic Signature(s) Signed: 02/02/2022 5:07:36 PM By: Adline Peals Entered By: Adline Peals on 02/02/2022 09:23:57 -------------------------------------------------------------------------------- Encounter Discharge Information Details Patient Name: Date of Service: [name], [name] 02/02/2022 9:15 A M Medical Record Number: [id number] Patient Account Number: [id number] Date of Birth/Sex: Treating RN: 04-28-1956 (66 y.o. [name] Primary Care Provider: Charlott Rakes Other Clinician: Referring Provider: Treating Provider/Extender: Carter Kitten Weeks in Treatment: 4 Encounter Discharge Information Items Post  Procedure Vitals Discharge Condition: Stable Temperature (F): 98.1 Ambulatory Status: Ambulatory Pulse (bpm): 76 Discharge Destination: Home Respiratory Rate (breaths/min): 18 Transportation: Private Auto Blood Pressure (mmHg): 150/90 Accompanied By: self Schedule Follow-up Appointment: Yes Clinical Summary of Care: Patient Declined Electronic Signature(s) Signed: 02/02/2022 5:07:36 PM By: Adline Peals Entered By: Adline Peals on 02/02/2022 09:55:27 -------------------------------------------------------------------------------- Lower Extremity Assessment Details Patient Name: Date of Service: [name] 02/02/2022 9:15 A M Medical Record Number: [id number] Patient Account Number: [id number] Date of Birth/Sex: Treating RN: 20-Mar-1956 (66 y.o. [name] Primary Care Provider: Charlott Rakes Other Clinician: Referring Provider: Treating Provider/Extender: Carter Kitten Weeks in Treatment: 4 Edema Assessment Assessed: [Left: No] [Right: No] E[Left: dema] [Right: :] Calf Left: Right: Point of Measurement: From Medial Instep 45.6 cm Ankle Left: Right: Point of Measurement: From Medial Instep 28.4 cm Vascular Assessment Pulses: Dorsalis Pedis Palpable: [Right:Yes] Electronic Signature(s) Signed: 02/02/2022 5:07:36 PM By: Adline Peals Entered By: Adline Peals on 02/02/2022 09:27:50 -------------------------------------------------------------------------------- Multi Wound Chart Details Patient Name: Date of Service: [name], [name] 02/02/2022 9:15 A M Medical Record Number: [id number] Patient Account Number: [id number] Date of Birth/Sex: Treating RN: 03/19/1956 (66 y.o. [name] Primary Care Provider: Charlott Rakes Other Clinician: Referring Provider: Treating Provider/Extender: Carter Kitten Weeks in Treatment: 4 Vital Signs Height(in): 75 Pulse(bpm):  76 Weight(lbs): 310 Blood Pressure(mmHg): 150/90 Body Mass Index(BMI): 38.7 Temperature(F): 98.1 Respiratory Rate(breaths/min): 18 Photos: [N/A:N/A] Right, Lateral Lower Leg N/A N/A Wound Location: Trauma N/A N/A Wounding Event: Venous Leg Ulcer N/A N/A Primary Etiology: Hypertension, Type II Diabetes N/A N/A Comorbid History: 12/03/2021 N/A N/A Date Acquired: 4 N/A N/A Weeks of Treatment: Open N/A N/A Wound Status: No N/A N/A Wound Recurrence: 3x1.3x0.1 N/A N/A Measurements L x W x D (cm) 3.063 N/A N/A A (cm) : rea 0.306 N/A N/A Volume (cm) : -189.00% N/A N/A % Reduction in A rea: -188.70% N/A N/A % Reduction in Volume: Full Thickness Without Exposed N/A N/A Classification:  Support Structures Medium N/A N/A Exudate A mount: Serosanguineous N/A N/A Exudate Type: red, brown N/A N/A Exudate Color: Distinct, outline attached N/A N/A Wound Margin: Small (1-33%) N/A N/A Granulation A mount: Red N/A N/A Granulation Quality: Large (67-100%) N/A N/A Necrotic A mount: Eschar, Adherent Slough N/A N/A Necrotic Tissue: Fat Layer (Subcutaneous Tissue): Yes N/A N/A Exposed Structures: Fascia: No Tendon: No Muscle: No Joint: No Bone: No Small (1-33%) N/A N/A Epithelialization: Debridement - Excisional N/A N/A Debridement: Pre-procedure Verification/Time Out 09:37 N/A N/A Taken: Lidocaine 4% Topical Solution N/A N/A Pain Control: Subcutaneous, Slough N/A N/A Tissue Debrided: Skin/Subcutaneous Tissue N/A N/A Level: 3.9 N/A N/A Debridement A (sq cm): rea Curette N/A N/A Instrument: Minimum N/A N/A Bleeding: Pressure N/A N/A Hemostasis A chieved: 8 N/A N/A Procedural Pain: 2 N/A N/A Post Procedural Pain: Procedure was tolerated well N/A N/A Debridement Treatment Response: 3x1.3x0.1 N/A N/A Post Debridement Measurements L x W x D (cm) 0.306 N/A N/A Post Debridement Volume: (cm) Debridement N/A N/A Procedures Performed: Treatment  Notes Electronic Signature(s) Signed: 02/02/2022 9:42:49 AM By: Fredirick Maudlin MD FACS Signed: 02/02/2022 5:07:36 PM By: Adline Peals Entered By: Fredirick Maudlin on 02/02/2022 09:42:48 -------------------------------------------------------------------------------- Multi-Disciplinary Care Plan Details Patient Name: Date of Service: [name], [name] 02/02/2022 9:15 A M Medical Record Number: [id number] Patient Account Number: [id number] Date of Birth/Sex: Treating RN: August 10, 1956 (66 y.o. [name] Primary Care Zeyad Delaguila: Charlott Rakes Other Clinician: Referring Alean Kromer: Treating Ensley Blas/Extender: Carter Kitten Weeks in Treatment: 4 Active Inactive Venous Leg Ulcer Nursing Diagnoses: Actual venous Insuffiency (use after diagnosis is confirmed) Knowledge deficit related to disease process and management Goals: Patient will maintain optimal edema control Date Initiated: 01/02/2022 Target Resolution Date: 03/09/2022 Goal Status: Active Patient/caregiver will verbalize understanding of disease process and disease management Date Initiated: 01/02/2022 Target Resolution Date: 03/09/2022 Goal Status: Active Interventions: Assess peripheral edema status every visit. Compression as ordered Provide education on venous insufficiency Treatment Activities: Non-invasive vascular studies : 01/02/2022 T ordered outside of clinic : 01/02/2022 est Therapeutic compression applied : 01/02/2022 Notes: Wound/Skin Impairment Nursing Diagnoses: Impaired tissue integrity Knowledge deficit related to ulceration/compromised skin integrity Goals: Patient/caregiver will verbalize understanding of skin care regimen Date Initiated: 01/02/2022 Date Inactivated: 02/02/2022 Target Resolution Date: 02/03/2022 Goal Status: Met Ulcer/skin breakdown will have a volume reduction of 30% by week 4 Date Initiated: 01/02/2022 Target Resolution Date: 03/09/2022 Goal Status:  Active Interventions: Assess ulceration(s) every visit Provide education on ulcer and skin care Treatment Activities: Skin care regimen initiated : 01/02/2022 Topical wound management initiated : 01/02/2022 Notes: Electronic Signature(s) Signed: 02/02/2022 5:07:36 PM By: Adline Peals Entered By: Adline Peals on 02/02/2022 09:30:35 -------------------------------------------------------------------------------- Pain Assessment Details Patient Name: Date of Service: [name], [name] 02/02/2022 9:15 A M Medical Record Number: [id number] Patient Account Number: [id number] Date of Birth/Sex: Treating RN: Aug 13, 1956 (66 y.o. [name] Primary Care Ken Bonn: Charlott Rakes Other Clinician: Referring Esaw Knippel: Treating Hampton Wixom/Extender: Carter Kitten Weeks in Treatment: 4 Active Problems Location of Pain Severity and Description of Pain Patient Has Paino Yes Site Locations Pain Location: Pain Location: Pain in Ulcers Duration of the Pain. Constant / Intermittento Intermittent Rate the pain. Current Pain Level: 2 Character of Pain Describe the Pain: Sharp, Shooting Pain Management and Medication Current Pain Management: Medication: Yes Electronic Signature(s) Signed: 02/02/2022 5:07:36 PM By: Adline Peals Entered By: Adline Peals on 02/02/2022 09:24:51 -------------------------------------------------------------------------------- Patient/Caregiver Education Details Patient Name: Date of Service: GLA DNEY, [name] 6/22/2023andnbsp9:15 A  M Medical Record Number: [id number] Patient Account Number: [id number] Date of Birth/Gender: Treating RN: [birth year] (66 y.o. [name] Primary Care Physician: Charlott Rakes Other Clinician: Referring Physician: Treating Physician/Extender: Carter Kitten Weeks in Treatment: 4 Education Assessment Education Provided To: Patient Education  Topics Provided Wound/Skin Impairment: Methods: Explain/Verbal Responses: Reinforcements needed, State content correctly Electronic Signature(s) Signed: 02/02/2022 5:07:36 PM By: Adline Peals Entered By: Adline Peals on 02/02/2022 09:30:53 -------------------------------------------------------------------------------- Wound Assessment Details Patient Name: Date of Service: [name] 02/02/2022 9:15 A M Medical Record Number: [id number] Patient Account Number: [id number] Date of Birth/Sex: Treating RN: 09/17/1955 (66 y.o. [name] Primary Care Provider: Charlott Rakes Other Clinician: Referring Provider: Treating Provider/Extender: Carter Kitten Weeks in Treatment: 4 Wound Status Wound Number: 2 Primary Etiology: Venous Leg Ulcer Wound Location: Right, Lateral Lower Leg Wound Status: Open Wounding Event: Trauma Comorbid History: Hypertension, Type II Diabetes Date Acquired: 12/03/2021 Weeks Of Treatment: 4 Clustered Wound: No Photos Wound Measurements Length: (cm) 3 Width: (cm) 1.3 Depth: (cm) 0.1 Area: (cm) 3.063 Volume: (cm) 0.306 % Reduction in Area: -189% % Reduction in Volume: -188.7% Epithelialization: Small (1-33%) Tunneling: No Undermining: No Wound Description Classification: Full Thickness Without Exposed Support Structures Wound Margin: Distinct, outline attached Exudate Amount: Medium Exudate Type: Serosanguineous Exudate Color: red, brown Foul Odor After Cleansing: No Slough/Fibrino Yes Wound Bed Granulation Amount: Small (1-33%) Exposed Structure Granulation Quality: Red Fascia Exposed: No Necrotic Amount: Large (67-100%) Fat Layer (Subcutaneous Tissue) Exposed: Yes Necrotic Quality: Eschar, Adherent Slough Tendon Exposed: No Muscle Exposed: No Joint Exposed: No Bone Exposed: No Treatment Notes Wound #2 (Lower Leg) Wound Laterality: Right, Lateral Cleanser Normal Saline Discharge  Instruction: Cleanse the wound with Normal Saline prior to applying a clean dressing using gauze sponges, not tissue or cotton balls. Soap and Water Discharge Instruction: May shower and wash wound with dial antibacterial soap and water prior to dressing change. Wound Cleanser Discharge Instruction: Cleanse the wound with wound cleanser prior to applying a clean dressing using gauze sponges, not tissue or cotton balls. Peri-Wound Care Sween Lotion (Moisturizing lotion) Discharge Instruction: Apply moisturizing lotion as directed Topical Primary Dressing Santyl Ointment Discharge Instruction: Apply nickel thick amount to wound bed as instructed Secondary Dressing Woven Gauze Sponge, Non-Sterile 4x4 in Discharge Instruction: Moisten with saline and apply over primary dressing as directed. Secured With [employer] 4x5 (in/yd) Discharge Instruction: Secure with Coban as directed. Kerlix Roll Sterile, 4.5x3.1 (in/yd) Discharge Instruction: Secure with Kerlix as directed. 37M Medipore H Soft Cloth Surgical T ape, 4 x 10 (in/yd) Discharge Instruction: Secure with tape as directed. Compression Wrap Compression Stockings Add-Ons Electronic Signature(s) Signed: 02/02/2022 5:07:36 PM By: Adline Peals Entered By: Adline Peals on 02/02/2022 09:29:49 -------------------------------------------------------------------------------- Vitals Details Patient Name: Date of Service: [name], [name] 02/02/2022 9:15 A M Medical Record Number: [id number] Patient Account Number: [id number] Date of Birth/Sex: Treating RN: 1956/07/06 (66 y.o. [name] Primary Care Provider: Charlott Rakes Other Clinician: Referring Provider: Treating Provider/Extender: Carter Kitten Weeks in Treatment: 4 Vital Signs Time Taken: 09:24 Temperature (F): 98.1 Height (in): 75 Pulse (bpm): 76 Weight (lbs): 310 Respiratory Rate (breaths/min): 18 Body Mass  Index (BMI): 38.7 Blood Pressure (mmHg): 150/90 Reference Range: 80 - 120 mg / dl Electronic Signature(s) Signed: 02/02/2022 5:07:36 PM By: Adline Peals Entered By: Adline Peals on 02/02/2022 66:44:03

## 2022-02-07 ENCOUNTER — Encounter (HOSPITAL_BASED_OUTPATIENT_CLINIC_OR_DEPARTMENT_OTHER): Payer: Medicare Other | Admitting: General Surgery

## 2022-02-07 DIAGNOSIS — I872 Venous insufficiency (chronic) (peripheral): Secondary | ICD-10-CM | POA: Diagnosis not present

## 2022-02-07 DIAGNOSIS — E1165 Type 2 diabetes mellitus with hyperglycemia: Secondary | ICD-10-CM | POA: Diagnosis not present

## 2022-02-07 DIAGNOSIS — I10 Essential (primary) hypertension: Secondary | ICD-10-CM | POA: Diagnosis not present

## 2022-02-07 DIAGNOSIS — L97812 Non-pressure chronic ulcer of other part of right lower leg with fat layer exposed: Secondary | ICD-10-CM | POA: Diagnosis not present

## 2022-02-07 DIAGNOSIS — S81801A Unspecified open wound, right lower leg, initial encounter: Secondary | ICD-10-CM | POA: Diagnosis not present

## 2022-02-07 DIAGNOSIS — E11622 Type 2 diabetes mellitus with other skin ulcer: Secondary | ICD-10-CM | POA: Diagnosis not present

## 2022-02-07 NOTE — Progress Notes (Addendum)
Anthony Barnes, Anthony E. ([id number]) Visit Report for 02/07/2022 Chief Complaint Document Details Patient Name: Date of Service: Anthony Barnes, [name] 02/07/2022 10:00 A M Medical Record Number: [id number] Patient Account Number: [id number] Date of Birth/Sex: Treating RN: 11/05/1955 (66 y.o. Anthony Barnes) Anthony Barnes Primary Care Provider: Hoy RegisterNewlin, Barnes Other Clinician: Referring Provider: Treating Provider/Extender: Anthony Barnes Anthony Barnes Weeks in Treatment: 5 Information Obtained from: Patient Chief Complaint Right LE Ulcer Electronic Signature(s) Signed: 02/07/2022 10:45:25 AM By: Anthony Guessannon, Cyera Balboni MD FACS Entered By: Anthony Guessannon, Faun Mcqueen on 02/07/2022 10:45:24 -------------------------------------------------------------------------------- Debridement Details Patient Name: Date of Service: Anthony HarborGLA Barnes, [name] 02/07/2022 10:00 A M Medical Record Number: [id number] Patient Account Number: [id number] Date of Birth/Sex: Treating RN: 11/05/1955 (66 y.o. Anthony Barnes) Anthony Barnes Primary Care Provider: Hoy RegisterNewlin, Barnes Other Clinician: Referring Provider: Treating Provider/Extender: Anthony Barnes Anthony Barnes Weeks in Treatment: 5 Debridement Performed for Assessment: Wound #2 Right,Lateral Lower Leg Performed By: Physician Anthony Guessannon, Selby Slovacek, MD Debridement Type: Debridement Severity of Tissue Pre Debridement: Fat layer exposed Level of Consciousness (Pre-procedure): Awake and Alert Pre-procedure Verification/Time Out Yes - 10:30 Taken: Start Time: 10:30 Pain Control: Lidocaine 4% T opical Solution T Area Debrided (L x W): otal 3.9 (cm) x 1.9 (cm) = 7.41 (cm) Tissue and other material debrided: Viable, Non-Viable, Slough, Subcutaneous, Slough Level: Skin/Subcutaneous Tissue Debridement Description: Excisional Instrument: Anthony Barnes Specimen: Tissue Culture Number of Specimens T aken: 1 Bleeding: Minimum Hemostasis Achieved: Pressure Procedural Pain: 7 Post Procedural Pain:  2 Response to Treatment: Procedure was tolerated well Level of Consciousness (Post- Awake and Alert procedure): Post Debridement Measurements of Total Wound Length: (cm) 3.9 Width: (cm) 1.9 Depth: (cm) 0.1 Volume: (cm) 0.582 Character of Wound/Ulcer Post Debridement: Improved Severity of Tissue Post Debridement: Fat layer exposed Post Procedure Diagnosis Same as Pre-procedure Electronic Signature(s) Signed: 02/07/2022 12:16:05 PM By: Anthony Guessannon, Avelyn Touch MD FACS Signed: 02/07/2022 4:36:27 PM By: Samuella BruinHerrington, Barnes Entered By: Samuella BruinHerrington, Barnes on 02/07/2022 10:33:30 -------------------------------------------------------------------------------- HPI Details Patient Name: Date of Service: Anthony HarborGLA Barnes, [name] 02/07/2022 10:00 A M Medical Record Number: [id number] Patient Account Number: [id number] Date of Birth/Sex: Treating RN: 11/05/1955 (66 y.o. Anthony Barnes) Anthony Barnes Primary Care Provider: Hoy RegisterNewlin, Barnes Other Clinician: Referring Provider: Treating Provider/Extender: Anthony Flamingannon, Anthony Barnes, Barnes Weeks in Treatment: 5 History of Present Illness HPI Description: ADMISSION 03/12/18 This is a 66 year old and it was a type II diabetic reasonably poorly controlled with a recent hemoglobin A1c of 8.2. He was tending to his lawn on 01/15/18 when his weed Anthony Barnes sent a rock up word hitting him in the right anterior leg. He was seen in his primary M.D. office is same time. An x-ray showed no abnormality. He was given a course of cephalexin. Since then he's been applying peroxide and topical antibiotics to the wound. He does not have a history of chronic wounds however he does have chronic edema in the lower legs. He is complaining of pain in the right leg wound but no real history of claudication. Past medical history includes type 2 diabetes, hypertension, morbid obesity. ABIs in the right leg were noncompressible 03/19/18 on evaluation today patient appears to be tolerating the wrap in  general fairly well. He states he's not having that much pain in regard to the wound itself. With that being said he is having some issues with slough buildup on the surface of the wound the Iodoflex does seem to be helpful in that regard. He is still having discomfort he wonders if I can send in a prescription for ibuprofen or something  of like to help him out. I do believe that the prox end may be of benefit for him. 03/25/18 on evaluation today patient appears to be doing very well in regard to his right lateral lower [name]. He's been tolerating the dressing changes without complication that is the wraps. Nonetheless he does continue to have pain he states is been dreading the possibility of having to have debridement again today. His first debridement experience was not optimal. With that being said he does seem to be showing signs of improvement there does appear to be little bit more granulation he does have a lot of slough that really does need to breeding away. 04/04/18;the patient went for arterial studies that were really quite normal. ABI on the right at 1.19 with triphasic waveforms on the left 1.11 with triphasic waveforms. TBI 0.93 on the right and 0.95 on the left. This suggests T should be able to tolerate even 4 layer compression. He is however complaining of a lot of pain with our wraps I'm wondering whether the pain is simply Iodoflex. I changed him to [employer] I'm still going to try to keep him in 3 layer compression 04/12/18 on evaluation today patient actually appears to be doing rather well in regard to his right lower [name] ulcer. He is making good progress although it is slow still I do believe that the Santyl is much better than the Iodoflex. The good news is the patient seems to be tolerating the dressing changes without complication now that we've gotten good of the Iodoflex which really did burn him quite significantly. Otherwise  there's no evidence of infection. 04/17/18 on evaluation today patient actually appears to be showing signs of improvement in regard to the ulcer. Unfortunately he's had somewhat of a rough day due to the fact that he found out that one of his friends suddenly and unexpectedly passed today. He states he's not sure he's in the frame of mind to allow me to debride the wound at this point. Nonetheless I do feel like he is showing signs of the wound getting better little by little each week. 04/24/18 on evaluation today patient's ulcer actually appears to be showing some signs of improvement albeit slow. He has been tolerating the dressing changes without complication except for the wrap which she states was so tight that he had to remove it it was causing a lot of discomfort. Fortunately there is no evidence of infection at this point. He states he only wore the wrap until about the time he got home last week and that he had to take it off. Nonetheless he does not have any other compression stockings, Juxta-Lite wraps, or anything otherwise to really help at this point as far as compression is concerned. 05/01/18 on evaluation today patient actually appears to be doing fairly well in regard to his lower extremity ulcer. He has been tolerating the dressing changes currently without complication. The wrap does seem to be helping as far as the fluid management is concerned. Wound bed itself actually show signs of good improvement although there is some Slough noted there's not as much as previous and he actually has fairly decent granulation noted as well. Overall I'm pleased with the progress of to this point. The patient would prefer not to have any debridement today he states he's actually not feeling too well in general and he really does not want any additional discomfort typically debridement is fairly uncomfortable for him. 05/08/18  on evaluation today patient actually appears to be doing a little better in  regard to his lower extremity ulcer. He has been tolerating the dressing changes without complication. With that being said I'm actually quite pleased with the fact the wound is not appear to be infected and in general has made a little progress. With that being said I do believe we need to perform some debridement to clean away the necrotic tissue on the surface of the wound. 05/15/18 on evaluation today patient actually appears to be doing a little better in regard to his wound. Fortunately there is some improvement noted fortunately there's also no significant evidence of infection at this point in time. He does have continued pain that really nothing different than previous he did get his Juxta-Lite wrap. 05/29/18 on evaluation today patient actually appears to be doing somewhat better in regard to his wound currently. He's been tolerating the dressing changes without complication. With that being said he has been performing the [facility] Dressing changes every day at home. I'm pretty sure that we told him every other day last week or when I saw him rather two weeks ago. With that being said obviously did not hurt to do it daily just that obviously is gonna run out of supplies sooner and that could get him into trouble as far as his insurance is concerned. Nonetheless he at this point still continues to have pain although honestly I don't feel like it's as bad as what it was in the past just based on his reactions at this point. 06/12/18 on evaluation today patient actually appears to be doing better in regard to his lower [name] ulcer. He's been tolerating the dressing changes without complication. Fortunately there does not appear to be any evidence of infection at this time. No fevers, chills, nausea, or vomiting noted at this time. 06/19/18 evaluation today patient's ulcer on the lower extremity appears to be doing better at this point. he has been tolerating the dressing changes. The  patient seems to be somewhat depressed about the progress of his wound although the overall appearance at this time seems to be doing well. In general I'm very pleased with the appearance today he does have some biofilm on the surface of the wound as well as of hyper granular tissue we may want to utilize a little bit of silver nitrate today. 06/27/2018; patient comes into clinic today for a wound on the right lateral calf likely related to chronic venous insufficiency. He has been using medihoney covered with Hydrofera Blue. Wound surface actually looks quite good 07/03/2018 seen today for follow-up and management of lower extremity ulcer right lateral shin. T olerating dressing changes. He has a small amount of biofilm today. Will attempt to remove a portion of the biofilm as he tolerates. He becomes very anxious with wound treatments. He would benefit from layer compression wraps however he refuses compression wraps or anything that smokes his legs. He expressed an inability to tolerate compression wraps. Juxta lite wraps have been ordered. He has been instructed on the appropriate application of the juxta leg wraps. His blood pressure today on visit was 200/120. He denies any blurred vision, chest pain, dizziness, shortness of breath, or difficulty with mobility. Mr. Kropf stated that he had a recent visit with his primary care provider. At that time his carvedilol was decreased from 25 mg daily to 12.5 mg daily. Strongly encouraged Mr. Netherton to seek medical attention today during visit. He declined transport for  evaluation of elevated blood pressure. Encouraged him to contact his primary care provider today for medication management. He stated that he would call his primary care doctor after wound visit today. Also encouraged him that if he experienced any symptoms of blurred vision, chest pain, shortness of breath to immediately seek medical attention. 07/17/18 on evaluation today patient's  wound actually does seem to show signs of improvement based on the overall appearance of the wound bed today. There does not appear to show any signs of infection which is good news. There is no overall worsening which is also good news. He still has hyper granular tissue will use in the [employer] followed by [facility] Dressing this point. 07/31/18 on evaluation today patient's wound bed actually show signs of improvement with good epithelialization especially in the upper portion of the wound. He has been tolerating the dressing changes without complication in general. Overall I'm extremely happy with how things stand. No fevers, chills, nausea, or vomiting noted at this time. 08/28/18 on evaluation today patient appears to be doing a little better in regard to his lower extremity ulcer. With that being said it appears to be very hyper granular I think this is directly attributed to the fact that he continues to use the [employer] underneath the [facility] Dressing. Although this helps not to stick unfortunately also think he's not getting the benefit of the [facility] Dressing particular and subsequently is causing too much moisture buildup hence the hyper granulation. Fortunately there does not appear to be any signs of infection at this time which is good news. 09/03/18 on evaluation today patient appears to be doing well in regard to his lower [name] ulcer. He has been tolerating the dressing changes without complication. Fortunately he has much less hyper granular tissue the noted last week I do think using silver nitrate in changing to using the [facility] Dressing without the mepitel has made a big difference for him this is good news. 09/18/18 on evaluation today patient appears to be doing excellent in regard to his right lower [name] ulcer. This is actually significantly smaller even compared to the last evaluation. Overall I'm very pleased in this regard. I'm a recommend that we  likely repeat the silver nitrate today based on what I'm seeing. 10/02/18 on evaluation today patient appears to be doing excellent in regard to his lower extremity wound on the right. He's been tolerating the dressing changes without complication. Fortunately there's no signs of infection at this time. Overall been very pleased with how things seem to be progressing. No fevers, chills, nausea, or vomiting noted at this time. 10/16/18 on evaluation today patient actually appears to be doing much better in regard to his lower extremity wound on the right. This is shown signs of good improvement and is indeed measuring smaller he is on a excellent track as far as healing is concerned. My hope is this will be healed of the next several weeks. Fortunately there is no evidence of infection at this time. He does seem to be doing everything that I'm recommending for him 10/30/18 on evaluation today patient appears to be doing better in regard to his lower extremity ulcer. He's been tolerating the dressing changes without complication. Fortunately the Hydrofera Blue Dressing to be doing the job. He has made excellent progress. With that being said this is still going somewhat slow but nonetheless is always making good progress at this point. 5/18-Patient was in to be seen for right leg ulcer  that appeared after scab above it was removed today. This area had healed completely. 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 The wound has not healed though it has been nearly 2 months since the injury occurred. He was seen in the emergency department at [facility] on May 9. At that time it was very painful and he described it as a "15 on a  scale of 010". He also was found to have 3+ pitting edema to the bilateral lower extremities. He was prescribed a weeks course of Keflex. He is here today because the wound has failed to heal and he has a prior history in our clinic. ABI in clinic today was noncompressible. He did have formal vascular studies performed in 2019 which were normal. The last hemoglobin A1c I have available for review was from March 21, 2021 and was elevated at 7.7%. The wound is fairly small and circular located about 3 inches above his right lateral malleolus. It is completely covered with eschar. No surrounding erythema, no odor, no purulent drainage. 01/11/2022: The wound on his right lateral leg is a little bit smaller today. It has reaccumulated some slough and a small bit of eschar. It remains fairly painful. He has another area on his anterior tibia that he will not let me touch but I am concerned that there is a wound forming underneath the surface. 01/18/2022: Unfortunately, his wound is a little bit bigger today. He continues to accumulate slough on the wound surface. It is still quite tender. 01/25/2022: The patient bumped his wound on the car door which resulted in bleeding. He was seen at urgent care where it was redressed. Unfortunately, the wound is bigger again today. There is accumulated slough on the surface. Urgent care gave him some tramadol, apparently and he took this prior to his visit so he could tolerate debridement better. 02/02/2022: The wound is a little bit smaller today. The surface, however, has accumulated a fairly thick layer of slough. The periwound skin is intact and there is no obvious sign of infection. 02/07/2022: The wound is a bit larger today. It has thick slough accumulation. The periwound skin is intact and there is no obvious erythema, induration, nor any odor. Electronic Signature(s) Signed: 02/07/2022 10:46:03 AM By: Anthony Guess MD FACS Signed: 02/07/2022 10:46:03 AM By:  Anthony Guess MD FACS Entered By: Anthony Guess on 02/07/2022 10:46:03 -------------------------------------------------------------------------------- Physical Exam Details Patient Name: Date of Service: Anthony Barnes, [name] 02/07/2022 10:00 A M Medical Record Number: [id number] Patient Account Number: [id number] Date of Birth/Sex: Treating RN: Sep 22, 1955 (66 y.o. Anthony Palau Primary Care Provider: Hoy Register Other Clinician: Referring Provider: Treating Provider/Extender: Anthony Flaming Weeks in Treatment: 5 Constitutional Slightly hypertensive. . . . No acute distress.Marland Kitchen Respiratory Normal work of breathing on room air.. Notes 02/07/2022: The wound is a bit larger today. It has thick slough accumulation. The periwound skin is intact and there is no obvious erythema, induration, nor any odor. Electronic Signature(s) Signed: 02/07/2022 10:46:34 AM By: Anthony Guess MD FACS Entered By: Anthony Guess on 02/07/2022 10:46:33 -------------------------------------------------------------------------------- Physician Orders Details Patient Name: Date of Service: Anthony Barnes, [name] 02/07/2022 10:00 A M Medical Record Number: [id number] Patient Account  Number: [id number] Date of Birth/Sex: Treating RN: [birth year] (66 y.o. Anthony Palau Primary Care Provider: Hoy Register Other Clinician: Referring Provider: Treating Provider/Extender: Anthony Flaming Weeks in Treatment: 5 Verbal / Phone Orders: No Diagnosis Coding ICD-10 Coding Code Description [phone] Non-pressure chronic ulcer of other part of right lower leg with fat layer exposed E11.622 Type 2 diabetes mellitus with other skin ulcer E11.9 Type 2 diabetes mellitus without complications I10 Essential (primary) hypertension I87.2 Venous insufficiency (chronic) (peripheral) Follow-up Appointments ppointment in 1 week. - Dr. Lady Gary - Room 2 - 7/6 at 10:00  AM Return A Bathing/ Shower/ Hygiene May shower with protection but do not get wound dressing(s) wet. - may purchase a cast protector from [facility] or [facility] Edema Control - Lymphedema / SCD / Other Elevate legs to the level of the heart or above for 30 minutes daily and/or when sitting, a frequency of: Avoid standing for long periods of time. Patient to wear own compression stockings every day. Exercise regularly Moisturize legs daily. Compression stocking or Garment 20-30 mm/Hg pressure to: - to L left until R leg is healed Wound Treatment Wound #2 - Lower Leg Wound Laterality: Right, Lateral Cleanser: Soap and Water 1 x Per Week/30 Days Discharge Instructions: May shower and wash wound with dial antibacterial soap and water prior to dressing change. Cleanser: Wound Cleanser 1 x Per Week/30 Days Discharge Instructions: Cleanse the wound with wound cleanser prior to applying a clean dressing using gauze sponges, not tissue or cotton balls. Peri-Wound Care: Sween Lotion (Moisturizing lotion) 1 x Per Week/30 Days Discharge Instructions: Apply moisturizing lotion as directed Prim Dressing: Hydrofera Blue Classic Foam, 4x4 in 1 x Per Week/30 Days ary Discharge Instructions: Moisten with saline prior to applying to wound bed Prim Dressing: Santyl Ointment 1 x Per Week/30 Days ary Discharge Instructions: Apply nickel thick amount to wound bed as instructed Secondary Dressing: Woven Gauze Sponge, Non-Sterile 4x4 in 1 x Per Week/30 Days Discharge Instructions: Apply over primary dressing as directed. Secured With: Coban Self-Adherent Wrap 4x5 (in/yd) 1 x Per Week/30 Days Discharge Instructions: Secure with Coban as directed. Secured With: [employer], 4.5x3.1 (in/yd) 1 x Per Week/30 Days Discharge Instructions: Secure with Kerlix as directed. Secured With: 56M Medipore H Soft Cloth Surgical T ape, 4 x 10 (in/yd) 1 x Per Week/30 Days Discharge Instructions: Secure with tape as  directed. Laboratory naerobe culture (MICRO) - PCR non-healing wound to R lateral leg Bacteria identified in Unspecified specimen by A LOINC Code: 635-3 Convenience Name: Anaerobic culture Electronic Signature(s) Signed: 02/10/2022 5:03:19 PM By: Samuella Bruin Signed: 02/13/2022 7:33:01 AM By: Anthony Guess MD FACS Previous Signature: 02/07/2022 10:47:19 AM Version By: Anthony Guess MD FACS Entered By: Samuella Bruin on 02/10/2022 17:02:21 -------------------------------------------------------------------------------- Problem List Details Patient Name: Date of Service: Anthony Barnes, [name] 02/07/2022 10:00 A M Medical Record Number: [id number] Patient Account Number: [id number] Date of Birth/Sex: Treating RN: 1955/08/20 (66 y.o. Anthony Palau Primary Care Provider: Hoy Register Other Clinician: Referring Provider: Treating Provider/Extender: Anthony Flaming Weeks in Treatment: 5 Active Problems ICD-10 Encounter Code Description Active Date MDM Diagnosis [phone] Non-pressure chronic ulcer of other part of right lower leg with fat layer 01/02/2022 No Yes exposed E11.622 Type 2 diabetes mellitus with other skin ulcer 01/02/2022 No Yes E11.9 Type 2 diabetes mellitus without complications 01/02/2022 No Yes I10 Essential (primary) hypertension 01/02/2022 No Yes I87.2 Venous insufficiency (chronic) (peripheral) 01/02/2022 No Yes Inactive Problems Resolved Problems Electronic Signature(s) Signed: 02/07/2022  10:45:13 AM By: Anthony Guess MD FACS Entered By: Anthony Guess on 02/07/2022 10:45:13 -------------------------------------------------------------------------------- Progress Note Details Patient Name: Date of Service: Anthony Barnes, [name] 02/07/2022 10:00 A M Medical Record Number: [id number] Patient Account Number: [id number] Date of Birth/Sex: Treating RN: [birth year] (66 y.o. Anthony Palau Primary Care Provider: Hoy Register Other Clinician: Referring Provider: Treating Provider/Extender: Anthony Flaming Weeks in Treatment: 5 Subjective Chief Complaint Information obtained from Patient Right LE Ulcer History of Present Illness (HPI) ADMISSION 03/12/18 This is a 66 year old and it was a type II diabetic reasonably poorly controlled with a recent hemoglobin A1c of 8.2. He was tending to his lawn on 01/15/18 when his weed Anthony Comings sent a rock up word hitting him in the right anterior leg. He was seen in his primary M.D. office is same time. An x-ray showed no abnormality. He was given a course of cephalexin. Since then he's been applying peroxide and topical antibiotics to the wound. He does not have a history of chronic wounds however he does have chronic edema in the lower legs. He is complaining of pain in the right leg wound but no real history of claudication. Past medical history includes type 2 diabetes, hypertension, morbid obesity. ABIs in the right leg were noncompressible 03/19/18 on evaluation today patient appears to be tolerating the wrap in general fairly well. He states he's not having that much pain in regard to the wound itself. With that being said he is having some issues with slough buildup on the surface of the wound the Iodoflex does seem to be helpful in that regard. He is still having discomfort he wonders if I can send in a prescription for ibuprofen or something of like to help him out. I do believe that the prox end may be of benefit for him. 03/25/18 on evaluation today patient appears to be doing very well in regard to his right lateral lower [name]. He's been tolerating the dressing changes without complication that is the wraps. Nonetheless he does continue to have pain he states is been dreading the possibility of having to have debridement again today. His first debridement experience was not optimal. With that being said he does seem to be  showing signs of improvement there does appear to be little bit more granulation he does have a lot of slough that really does need to breeding away. 04/04/18;the patient went for arterial studies that were really quite normal. ABI on the right at 1.19 with triphasic waveforms on the left 1.11 with triphasic waveforms. TBI 0.93 on the right and 0.95 on the left. This suggests T should be able to tolerate even 4 layer compression. He is however complaining of a lot of pain with our wraps I'm wondering whether the pain is simply Iodoflex. I changed him to [employer] I'm still going to try to keep him in 3 layer compression 04/12/18 on evaluation today patient actually appears to be doing rather well in regard to his right lower [name] ulcer. He is making good progress although it is slow still I do believe that the Santyl is much better than the Iodoflex. The good news is the patient seems to be tolerating the dressing changes without complication now that we've gotten good of the Iodoflex which really did burn him quite significantly. Otherwise there's no evidence of infection. 04/17/18 on evaluation today patient actually appears to be showing signs of improvement in regard to the ulcer. Unfortunately he's  had somewhat of a rough day due to the fact that he found out that one of his friends suddenly and unexpectedly passed today. He states he's not sure he's in the frame of mind to allow me to debride the wound at this point. Nonetheless I do feel like he is showing signs of the wound getting better little by little each week. 04/24/18 on evaluation today patient's ulcer actually appears to be showing some signs of improvement albeit slow. He has been tolerating the dressing changes without complication except for the wrap which she states was so tight that he had to remove it it was causing a lot of discomfort. Fortunately there is no evidence of infection at this point. He states he  only wore the wrap until about the time he got home last week and that he had to take it off. Nonetheless he does not have any other compression stockings, Juxta-Lite wraps, or anything otherwise to really help at this point as far as compression is concerned. 05/01/18 on evaluation today patient actually appears to be doing fairly well in regard to his lower extremity ulcer. He has been tolerating the dressing changes currently without complication. The wrap does seem to be helping as far as the fluid management is concerned. Wound bed itself actually show signs of good improvement although there is some Slough noted there's not as much as previous and he actually has fairly decent granulation noted as well. Overall I'm pleased with the progress of to this point. The patient would prefer not to have any debridement today he states he's actually not feeling too well in general and he really does not want any additional discomfort typically debridement is fairly uncomfortable for him. 05/08/18 on evaluation today patient actually appears to be doing a little better in regard to his lower extremity ulcer. He has been tolerating the dressing changes without complication. With that being said I'm actually quite pleased with the fact the wound is not appear to be infected and in general has made a little progress. With that being said I do believe we need to perform some debridement to clean away the necrotic tissue on the surface of the wound. 05/15/18 on evaluation today patient actually appears to be doing a little better in regard to his wound. Fortunately there is some improvement noted fortunately there's also no significant evidence of infection at this point in time. He does have continued pain that really nothing different than previous he did get his Juxta-Lite wrap. 05/29/18 on evaluation today patient actually appears to be doing somewhat better in regard to his wound currently. He's been tolerating  the dressing changes without complication. With that being said he has been performing the [facility] Dressing changes every day at home. I'm pretty sure that we told him every other day last week or when I saw him rather two weeks ago. With that being said obviously did not hurt to do it daily just that obviously is gonna run out of supplies sooner and that could get him into trouble as far as his insurance is concerned. Nonetheless he at this point still continues to have pain although honestly I don't feel like it's as bad as what it was in the past just based on his reactions at this point. 06/12/18 on evaluation today patient actually appears to be doing better in regard to his lower [name] ulcer. He's been tolerating the dressing changes without complication. Fortunately there does not appear to be any evidence  of infection at this time. No fevers, chills, nausea, or vomiting noted at this time. 06/19/18 evaluation today patient's ulcer on the lower extremity appears to be doing better at this point. he has been tolerating the dressing changes. The patient seems to be somewhat depressed about the progress of his wound although the overall appearance at this time seems to be doing well. In general I'm very pleased with the appearance today he does have some biofilm on the surface of the wound as well as of hyper granular tissue we may want to utilize a little bit of silver nitrate today. 06/27/2018; patient comes into clinic today for a wound on the right lateral calf likely related to chronic venous insufficiency. He has been using medihoney covered with Hydrofera Blue. Wound surface actually looks quite good 07/03/2018 seen today for follow-up and management of lower extremity ulcer right lateral shin. T olerating dressing changes. He has a small amount of biofilm today. Will attempt to remove a portion of the biofilm as he tolerates. He becomes very anxious with wound treatments. He would  benefit from layer compression wraps however he refuses compression wraps or anything that smokes his legs. He expressed an inability to tolerate compression wraps. Juxta lite wraps have been ordered. He has been instructed on the appropriate application of the juxta leg wraps. His blood pressure today on visit was 200/120. He denies any blurred vision, chest pain, dizziness, shortness of breath, or difficulty with mobility. Mr. Dohse stated that he had a recent visit with his primary care provider. At that time his carvedilol was decreased from 25 mg daily to 12.5 mg daily. Strongly encouraged Mr. Bruso to seek medical attention today during visit. He declined transport for evaluation of elevated blood pressure. Encouraged him to contact his primary care provider today for medication management. He stated that he would call his primary care doctor after wound visit today. Also encouraged him that if he experienced any symptoms of blurred vision, chest pain, shortness of breath to immediately seek medical attention. 07/17/18 on evaluation today patient's wound actually does seem to show signs of improvement based on the overall appearance of the wound bed today. There does not appear to show any signs of infection which is good news. There is no overall worsening which is also good news. He still has hyper granular tissue will use in the [employer] followed by [facility] Dressing this point. 07/31/18 on evaluation today patient's wound bed actually show signs of improvement with good epithelialization especially in the upper portion of the wound. He has been tolerating the dressing changes without complication in general. Overall I'm extremely happy with how things stand. No fevers, chills, nausea, or vomiting noted at this time. 08/28/18 on evaluation today patient appears to be doing a little better in regard to his lower extremity ulcer. With that being said it appears to be very hyper granular  I think this is directly attributed to the fact that he continues to use the [employer] underneath the [facility] Dressing. Although this helps not to stick unfortunately also think he's not getting the benefit of the [facility] Dressing particular and subsequently is causing too much moisture buildup hence the hyper granulation. Fortunately there does not appear to be any signs of infection at this time which is good news. 09/03/18 on evaluation today patient appears to be doing well in regard to his lower [name] ulcer. He has been tolerating the dressing changes without complication. Fortunately he has much less  hyper granular tissue the noted last week I do think using silver nitrate in changing to using the Hydrofera Blue Dressing without the mepitel has made a big difference for him this is good news. 09/18/18 on evaluation today patient appears to be doing excellent in regard to his right lower [name] ulcer. This is actually significantly smaller even compared to the last evaluation. Overall I'm very pleased in this regard. I'm a recommend that we likely repeat the silver nitrate today based on what I'm seeing. 10/02/18 on evaluation today patient appears to be doing excellent in regard to his lower extremity wound on the right. He's been tolerating the dressing changes without complication. Fortunately there's no signs of infection at this time. Overall been very pleased with how things seem to be progressing. No fevers, chills, nausea, or vomiting noted at this time. 10/16/18 on evaluation today patient actually appears to be doing much better in regard to his lower extremity wound on the right. This is shown signs of good improvement and is indeed measuring smaller he is on a excellent track as far as healing is concerned. My hope is this will be healed of the next several weeks. Fortunately there is no evidence of infection at this time. He does seem to be doing everything that I'm  recommending for him 10/30/18 on evaluation today patient appears to be doing better in regard to his lower extremity ulcer. He's been tolerating the dressing changes without complication. Fortunately the Hydrofera Blue Dressing to be doing the job. He has made excellent progress. With that being said this is still going somewhat slow but nonetheless is always making good progress at this point. 5/18-Patient was in to be seen for right leg ulcer that appeared after scab above it was removed today. This area had healed completely. 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 The wound has not healed though it has been nearly 2 months since the injury occurred. He was seen in the emergency department at [facility] on May 9. At that time it was very painful and he described it as a "15 on a scale of 0oo10". He also was found to have 3+ pitting edema to the bilateral lower extremities. He was prescribed a weeks course of Keflex. He is here today because the wound has failed to heal and he has a prior history in our clinic. ABI in clinic today was noncompressible. He did have formal vascular studies performed in 2019 which were normal. The last hemoglobin A1c I have available for review was from March 21, 2021 and was elevated at 7.7%. The wound is fairly small and circular located about 3 inches above his right lateral malleolus. It is completely covered with eschar. No surrounding erythema, no odor, no purulent drainage. 01/11/2022: The wound on his right lateral leg is a little bit smaller today. It has reaccumulated some slough and a small bit of eschar. It remains fairly painful. He has another area on his anterior tibia that he  will not let me touch but I am concerned that there is a wound forming underneath the surface. 01/18/2022: Unfortunately, his wound is a little bit bigger today. He continues to accumulate slough on the wound surface. It  is still quite tender. 01/25/2022: The patient bumped his wound on the car door which resulted in bleeding. He was seen at urgent care where it was redressed. Unfortunately, the wound is bigger again today. There is accumulated slough on the surface. Urgent care gave him some tramadol, apparently and he took this prior to his visit so he could tolerate debridement better. 02/02/2022: The wound is a little bit smaller today. The surface, however, has accumulated a fairly thick layer of slough. The periwound skin is intact and there is no obvious sign of infection. 02/07/2022: The wound is a bit larger today. It has thick slough accumulation. The periwound skin is intact and there is no obvious erythema, induration, nor any odor. Patient History Information obtained from Patient. Family History Cancer - Mother, Diabetes - Mother,Father, Hypertension - Mother,Father, Kidney Disease - Siblings, Stroke - Father, No family history of Heart Disease, Hereditary Spherocytosis, Lung Disease, Seizures, Thyroid Problems, Tuberculosis. Social History Former smoker - ended on 08/14/1990, Marital Status - Married, Alcohol Use - Moderate, Drug Use - No History, Caffeine Use - Daily. Medical History Eyes Denies history of Cataracts, Glaucoma, Optic Neuritis Ear/Nose/Mouth/Throat Denies history of Chronic sinus problems/congestion, Middle ear problems Hematologic/Lymphatic Denies history of Anemia, Hemophilia, Human Immunodeficiency Virus, Lymphedema, Sickle Cell Disease Respiratory Denies history of Aspiration, Asthma, Chronic Obstructive Pulmonary Disease (COPD), Pneumothorax, Sleep Apnea, Tuberculosis Cardiovascular Patient has history of Hypertension Denies history of Angina, Arrhythmia,  Congestive Heart Failure, Coronary Artery Disease, Deep Vein Thrombosis, Hypotension, Myocardial Infarction, Peripheral Arterial Disease, Peripheral Venous Disease, Phlebitis, Vasculitis Gastrointestinal Denies history of Cirrhosis , Colitis, Crohnoos, Hepatitis A, Hepatitis B, Hepatitis C Endocrine Patient has history of Type II Diabetes Genitourinary Denies history of End Stage Renal Disease Immunological Denies history of Lupus Erythematosus, Raynaudoos, Scleroderma Integumentary (Skin) Denies history of History of Burn Musculoskeletal Denies history of Gout, Rheumatoid Arthritis, Osteoarthritis, Osteomyelitis Neurologic Denies history of Dementia, Neuropathy, Quadriplegia, Paraplegia, Seizure Disorder Oncologic Denies history of Received Chemotherapy, Received Radiation Psychiatric Denies history of Anorexia/bulimia, Confinement Anxiety Hospitalization/Surgery History - esophagogastroduodenoscopy. - brain aneurysm surgery. Medical A Surgical History Notes nd Constitutional Symptoms (General Health) obesity Gastrointestinal diverticulitis Neurologic neuropathy Objective Constitutional Slightly hypertensive. No acute distress.. Vitals Time Taken: 10:16 AM, Height: 75 in, Weight: 310 lbs, BMI: 38.7, Temperature: 98.2 F, Pulse: 74 bpm, Respiratory Rate: 18 breaths/min, Blood Pressure: 146/90 mmHg. Respiratory Normal work of breathing on room air.. General Notes: 02/07/2022: The wound is a bit larger today. It has thick slough accumulation. The periwound skin is intact and there is no obvious erythema, induration, nor any odor. Integumentary (Hair, Skin) Wound #2 status is Open. Original cause of wound was Trauma. The date acquired was: 12/03/2021. The wound has been in treatment 5 weeks. The wound is located on the Right,Lateral Lower Leg. The wound measures 3.9cm length x 1.9cm width x 0.1cm depth; 5.82cm^2 area and 0.582cm^3 volume. There is Fat Layer (Subcutaneous  Tissue) exposed. There is no tunneling or undermining noted. There is a medium amount of serosanguineous drainage noted. The wound margin is distinct with the outline attached to the wound base. There is small (1-33%) red granulation within the wound bed. There is a large (67-100%) amount of necrotic tissue within the wound bed including Adherent Slough. Assessment Active Problems ICD-10 Non-pressure chronic ulcer of other part of right lower leg with fat layer exposed Type 2 diabetes mellitus with other skin ulcer Type 2 diabetes mellitus without complications Essential (primary) hypertension Venous insufficiency (chronic) (peripheral) Procedures Wound #2 Pre-procedure  diagnosis of Wound #2 is a Venous Leg Ulcer located on the Right,Lateral Lower Leg .Severity of Tissue Pre Debridement is: Fat layer exposed. There was a Excisional Skin/Subcutaneous Tissue Debridement with a total area of 7.41 sq cm performed by Anthony Guess, MD. With the following instrument(s): Anthony Barnes to remove Viable and Non-Viable tissue/material. Material removed includes Subcutaneous Tissue and Slough and after achieving pain control using Lidocaine 4% T opical Solution. 1 specimen was taken by a Tissue Culture and sent to the lab per facility protocol. A time out was conducted at 10:30, prior to the start of the procedure. A Minimum amount of bleeding was controlled with Pressure. The procedure was tolerated well with a pain level of 7 throughout and a pain level of 2 following the procedure. Post Debridement Measurements: 3.9cm length x 1.9cm width x 0.1cm depth; 0.582cm^3 volume. Character of Wound/Ulcer Post Debridement is improved. Severity of Tissue Post Debridement is: Fat layer exposed. Post procedure Diagnosis Wound #2: Same as Pre-Procedure Plan Follow-up Appointments: Return Appointment in 1 week. - Dr. Lady Gary - Room 2 - 7/6 at 10:00 AM Bathing/ Shower/ Hygiene: May shower with protection but do not get  wound dressing(s) wet. - may purchase a cast protector from [facility] or [facility] Edema Control - Lymphedema / SCD / Other: Elevate legs to the level of the heart or above for 30 minutes daily and/or when sitting, a frequency of: Avoid standing for long periods of time. Patient to wear own compression stockings every day. Exercise regularly Moisturize legs daily. Compression stocking or Garment 20-30 mm/Hg pressure to: - to L left until R leg is healed WOUND #2: - Lower Leg Wound Laterality: Right, Lateral Cleanser: Soap and Water 1 x Per Week/30 Days Discharge Instructions: May shower and wash wound with dial antibacterial soap and water prior to dressing change. Cleanser: Wound Cleanser 1 x Per Week/30 Days Discharge Instructions: Cleanse the wound with wound cleanser prior to applying a clean dressing using gauze sponges, not tissue or cotton balls. Peri-Wound Care: Sween Lotion (Moisturizing lotion) 1 x Per Week/30 Days Discharge Instructions: Apply moisturizing lotion as directed Prim Dressing: Hydrofera Blue Classic Foam, 4x4 in 1 x Per Week/30 Days ary Discharge Instructions: Moisten with saline prior to applying to wound bed Prim Dressing: Santyl Ointment 1 x Per Week/30 Days ary Discharge Instructions: Apply nickel thick amount to wound bed as instructed Secondary Dressing: Woven Gauze Sponge, Non-Sterile 4x4 in 1 x Per Week/30 Days Discharge Instructions: Apply over primary dressing as directed. Secured With: Coban Self-Adherent Wrap 4x5 (in/yd) 1 x Per Week/30 Days Discharge Instructions: Secure with Coban as directed. Secured With: [employer], 4.5x3.1 (in/yd) 1 x Per Week/30 Days Discharge Instructions: Secure with Kerlix as directed. Secured With: 86M Medipore H Soft Cloth Surgical T ape, 4 x 10 (in/yd) 1 x Per Week/30 Days Discharge Instructions: Secure with tape as directed. 02/07/2022: The wound is a bit larger today. It has thick slough accumulation. The periwound skin  is intact and there is no obvious erythema, induration, nor any odor. I used a Anthony Barnes to debride the slough from the wound. Due to the continued expansion, I did take a culture, despite no obvious signs of infection. We are going to continue using the Santyl but back it with Hydrofera Blue. Once I have culture data available, I will initiate antibiotic therapy if warranted, oral/topical, depending on the findings. Follow-up in 1 week. Electronic Signature(s) Signed: 02/07/2022 10:48:26 AM By: Anthony Guess MD FACS Entered By: Anthony Guess  on 02/07/2022 10:48:26 -------------------------------------------------------------------------------- HxROS Details Patient Name: Date of Service: Anthony Barnes [name] 02/07/2022 10:00 A M Medical Record Number: [id number] Patient Account Number: [id number] Date of Birth/Sex: Treating RN: 1956/04/30 (66 y.o. Anthony Palau Primary Care Provider: Hoy Register Other Clinician: Referring Provider: Treating Provider/Extender: Anthony Flaming Weeks in Treatment: 5 Information Obtained From Patient Constitutional Symptoms (General Health) Medical History: Past Medical History Notes: obesity Eyes Medical History: Negative for: Cataracts; Glaucoma; Optic Neuritis Ear/Nose/Mouth/Throat Medical History: Negative for: Chronic sinus problems/congestion; Middle ear problems Hematologic/Lymphatic Medical History: Negative for: Anemia; Hemophilia; Human Immunodeficiency Virus; Lymphedema; Sickle Cell Disease Respiratory Medical History: Negative for: Aspiration; Asthma; Chronic Obstructive Pulmonary Disease (COPD); Pneumothorax; Sleep Apnea; Tuberculosis Cardiovascular Medical History: Positive for: Hypertension Negative for: Angina; Arrhythmia; Congestive Heart Failure; Coronary Artery Disease; Deep Vein Thrombosis; Hypotension; Myocardial Infarction; Peripheral Arterial Disease; Peripheral Venous Disease; Phlebitis;  Vasculitis Gastrointestinal Medical History: Negative for: Cirrhosis ; Colitis; Crohns; Hepatitis A; Hepatitis B; Hepatitis C Past Medical History Notes: diverticulitis Endocrine Medical History: Positive for: Type II Diabetes Time with diabetes: 8 years Treated with: Oral agents Blood sugar tested every day: No Genitourinary Medical History: Negative for: End Stage Renal Disease Immunological Medical History: Negative for: Lupus Erythematosus; Raynauds; Scleroderma Integumentary (Skin) Medical History: Negative for: History of Burn Musculoskeletal Medical History: Negative for: Gout; Rheumatoid Arthritis; Osteoarthritis; Osteomyelitis Neurologic Medical History: Negative for: Dementia; Neuropathy; Quadriplegia; Paraplegia; Seizure Disorder Past Medical History Notes: neuropathy Oncologic Medical History: Negative for: Received Chemotherapy; Received Radiation Psychiatric Medical History: Negative for: Anorexia/bulimia; Confinement Anxiety Immunizations Pneumococcal Vaccine: Received Pneumococcal Vaccination: No Immunization Notes: tetanus 2 years ago Implantable Devices None Hospitalization / Surgery History Type of Hospitalization/Surgery esophagogastroduodenoscopy brain aneurysm surgery Family and Social History Cancer: Yes - Mother; Diabetes: Yes - Mother,Father; Heart Disease: No; Hereditary Spherocytosis: No; Hypertension: Yes - Mother,Father; Kidney Disease: Yes - Siblings; Lung Disease: No; Seizures: No; Stroke: Yes - Father; Thyroid Problems: No; Tuberculosis: No; Former smoker - ended on 08/14/1990; Marital Status - Married; Alcohol Use: Moderate; Drug Use: No History; Caffeine Use: Daily; Financial Concerns: No; Food, Clothing or Shelter Needs: No; Support System Lacking: No; Transportation Concerns: No Electronic Signature(s) Signed: 02/07/2022 12:16:05 PM By: Anthony Guess MD FACS Signed: 02/07/2022 4:36:27 PM By: Samuella Bruin Entered By:  Anthony Guess on 02/07/2022 10:46:09 -------------------------------------------------------------------------------- SuperBill Details Patient Name: Date of Service: Anthony Barnes, [name] 02/07/2022 Medical Record Number: [id number] Patient Account Number: [id number] Date of Birth/Sex: Treating RN: Feb 11, 1956 (66 y.o. 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.622 Type 2 diabetes mellitus with other skin ulcer E11.9 Type 2 diabetes mellitus without complications I10 Essential (primary) hypertension I87.2 Venous insufficiency (chronic) (peripheral) Facility Procedures CPT4 Code: [id number] Description: 11042 - DEB SUBQ TISSUE 20 SQ CM/< ICD-10 Diagnosis Description 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 Modifier: osed Quantity: 1 Physician Procedures : CPT4 Code Description Modifier [id number] - WC PHYS LEVEL 3 - EST PT 25 ICD-10 Diagnosis Description 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osed E11.622 Type 2 diabetes mellitus with other skin ulcer  I87.2 Venous insufficiency (chronic) (peripheral) I10 Essential (primary) hypertension Quantity: 1 : [id number] 11042 - WC PHYS SUBQ TISS 20 SQ CM ICD-10  Diagnosis Description 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osed Quantity: 1 Electronic Signature(s) Signed: 02/07/2022 10:48:45 AM By: Anthony Guess MD FACS Entered By: Anthony Guess on 02/07/2022 10:48:45

## 2022-02-10 ENCOUNTER — Encounter (HOSPITAL_BASED_OUTPATIENT_CLINIC_OR_DEPARTMENT_OTHER): Payer: Medicare Other

## 2022-02-16 ENCOUNTER — Encounter (HOSPITAL_BASED_OUTPATIENT_CLINIC_OR_DEPARTMENT_OTHER): Payer: Medicare Other | Attending: General Surgery | Admitting: General Surgery

## 2022-02-16 DIAGNOSIS — I10 Essential (primary) hypertension: Secondary | ICD-10-CM | POA: Insufficient documentation

## 2022-02-16 DIAGNOSIS — E119 Type 2 diabetes mellitus without complications: Secondary | ICD-10-CM | POA: Insufficient documentation

## 2022-02-16 DIAGNOSIS — E11622 Type 2 diabetes mellitus with other skin ulcer: Secondary | ICD-10-CM | POA: Insufficient documentation

## 2022-02-16 DIAGNOSIS — L97812 Non-pressure chronic ulcer of other part of right lower leg with fat layer exposed: Secondary | ICD-10-CM | POA: Insufficient documentation

## 2022-02-16 DIAGNOSIS — I872 Venous insufficiency (chronic) (peripheral): Secondary | ICD-10-CM | POA: Diagnosis not present

## 2022-02-16 NOTE — Progress Notes (Signed)
Anthony Barnes ([id number]) Visit Report for 02/16/2022 Chief Complaint Document Details Patient Name: Date of Service: Anthony Barnes Anthony E. 02/16/2022 10:00 A M Medical Record Number: [id number] Patient Account Number: [id number] Date of Birth/Sex: Treating RN: 1956-05-26 (66 y.o. Anthony Barnes Primary Care Provider: Hoy Barnes Other Clinician: Referring Provider: Treating Provider/Extender: Anthony Barnes Weeks in Treatment: 6 Information Obtained from: Patient Chief Complaint Right LE Ulcer Electronic Signature(s) Signed: 02/16/2022 10:32:09 AM By: Anthony Guess MD FACS Entered By: Anthony Barnes on 02/16/2022 10:32:09 -------------------------------------------------------------------------------- Debridement Details Patient Name: Date of Service: Danne Barnes, Anthony Anthony E. 02/16/2022 10:00 A M Medical Record Number: [id number] Patient Account Number: [id number] Date of Birth/Sex: Treating RN: Aug 22, 1955 (66 y.o. Anthony Barnes Primary Care Provider: Hoy Barnes Other Clinician: Referring Provider: Treating Provider/Extender: Anthony Barnes Weeks in Treatment: 6 Debridement Performed for Assessment: Wound #2 Right,Lateral Lower Leg Performed By: Physician Anthony Guess, MD Debridement Type: Debridement Severity of Tissue Pre Debridement: Fat layer exposed Level of Consciousness (Pre-procedure): Awake and Alert Pre-procedure Verification/Time Out Yes - 10:25 Taken: Start Time: 10:25 Pain Control: Lidocaine 5% topical ointment T Area Debrided (L x W): otal 3.2 (cm) x 1.8 (cm) = 5.76 (cm) Tissue and other material debrided: Viable, Non-Viable, Slough, Subcutaneous, Slough Level: Skin/Subcutaneous Tissue Debridement Description: Excisional Instrument: Curette Bleeding: Minimum Hemostasis Achieved: Pressure Procedural Pain: 8 Post Procedural Pain: 2 Response to Treatment: Procedure was tolerated well Level of  Consciousness (Post- Awake and Alert procedure): Post Debridement Measurements of Total Wound Length: (cm) 3.2 Width: (cm) 1.8 Depth: (cm) 0.1 Volume: (cm) 0.452 Character of Wound/Ulcer Post Debridement: Improved Severity of Tissue Post Debridement: Fat layer exposed Post Procedure Diagnosis Same as Pre-procedure Electronic Signature(s) Signed: 02/16/2022 12:58:53 PM By: Anthony Guess MD FACS Signed: 02/16/2022 4:34:44 PM By: Anthony Barnes Entered By: Anthony Barnes on 02/16/2022 10:26:26 -------------------------------------------------------------------------------- HPI Details Patient Name: Date of Service: Danne Barnes, Anthony Anthony E. 02/16/2022 10:00 A M Medical Record Number: [id number] Patient Account Number: [id number] Date of Birth/Sex: Treating RN: 1956-07-31 (66 y.o. Anthony Barnes Primary Care Provider: Hoy Barnes Other Clinician: Referring Provider: Treating Provider/Extender: Anthony Barnes Weeks in Treatment: 6 History of Present Illness HPI Description: ADMISSION 03/12/18 This is a 66 year old and it was a type II diabetic reasonably poorly controlled with a recent hemoglobin A1c of 8.2. He was tending to his lawn on 01/15/18 when his weed Anthony Barnes sent a rock up word hitting him in the right anterior leg. He was seen in his primary M.D. office is same time. An x-ray showed no abnormality. He was given a course of cephalexin. Since then he's been applying peroxide and topical antibiotics to the wound. He does not have a history of chronic wounds however he does have chronic edema in the lower legs. He is complaining of pain in the right leg wound but no real history of claudication. Past medical history includes type 2 diabetes, hypertension, morbid obesity. ABIs in the right leg were noncompressible 03/19/18 on evaluation today patient appears to be tolerating the wrap in general fairly well. He states he's not having that much pain in regard  to the wound itself. With that being said he is having some issues with slough buildup on the surface of the wound the Iodoflex does seem to be helpful in that regard. He is still having discomfort he wonders if I can send in a prescription for ibuprofen or something of like to help him out. I do believe that  the prox end may be of benefit for him. 03/25/18 on evaluation today patient appears to be doing very well in regard to his right lateral lower Anthony Barnes ulcer. He's been tolerating the dressing changes without complication that is the wraps. Nonetheless he does continue to have pain he states is been dreading the possibility of having to have debridement again today. His first debridement experience was not optimal. With that being said he does seem to be showing signs of improvement there does appear to be little bit more granulation he does have a lot of slough that really does need to breeding away. 04/04/18;the patient went for arterial studies that were really quite normal. ABI on the right at 1.19 with triphasic waveforms on the left 1.11 with triphasic waveforms. TBI 0.93 on the right and 0.95 on the left. This suggests T should be able to tolerate even 4 layer compression. He is however complaining of a lot of pain with our wraps I'm wondering whether the pain is simply Iodoflex. I changed him to [employer] I'm still going to try to keep him in 3 layer compression 04/12/18 on evaluation today patient actually appears to be doing rather well in regard to his right lower Anthony ulcer. He is making good progress although it is slow still I do believe that the Santyl is much better than the Iodoflex. The good news is the patient seems to be tolerating the dressing changes without complication now that we've gotten good of the Iodoflex which really did burn him quite significantly. Otherwise there's no evidence of infection. 04/17/18 on evaluation today patient  actually appears to be showing signs of improvement in regard to the ulcer. Unfortunately he's had somewhat of a rough day due to the fact that he found out that one of his friends suddenly and unexpectedly passed today. He states he's not sure he's in the frame of mind to allow me to debride the wound at this point. Nonetheless I do feel like he is showing signs of the wound getting better little by little each week. 04/24/18 on evaluation today patient's ulcer actually appears to be showing some signs of improvement albeit slow. He has been tolerating the dressing changes without complication except for the wrap which she states was so tight that he had to remove it it was causing a lot of discomfort. Fortunately there is no evidence of infection at this point. He states he only wore the wrap until about the time he got home last week and that he had to take it off. Nonetheless he does not have any other compression stockings, Juxta-Lite wraps, or anything otherwise to really help at this point as far as compression is concerned. 05/01/18 on evaluation today patient actually appears to be doing fairly well in regard to his lower Barnes ulcer. He has been tolerating the dressing changes currently without complication. The wrap does seem to be helping as far as the fluid management is concerned. Wound bed itself actually show signs of good improvement although there is some Slough noted there's not as much as previous and he actually has fairly decent granulation noted as well. Overall I'm pleased with the progress of to this point. The patient would prefer not to have any debridement today he states he's actually not feeling too well in general and he really does not want any additional discomfort typically debridement is fairly uncomfortable for him. 05/08/18 on evaluation today patient actually appears to be doing a  little better in regard to his lower Barnes ulcer. He has been tolerating the  dressing changes without complication. With that being said I'm actually quite pleased with the fact the wound is not appear to be infected and in general has made a little progress. With that being said I do believe we need to perform some debridement to clean away the necrotic tissue on the surface of the wound. 05/15/18 on evaluation today patient actually appears to be doing a little better in regard to his wound. Fortunately there is some improvement noted fortunately there's also no significant evidence of infection at this point in time. He does have continued pain that really nothing different than previous he did get his Juxta-Lite wrap. 05/29/18 on evaluation today patient actually appears to be doing somewhat better in regard to his wound currently. He's been tolerating the dressing changes without complication. With that being said he has been performing the [facility] Dressing changes every day at home. I'm pretty sure that we told him every other day last week or when I saw him rather two weeks ago. With that being said obviously did not hurt to do it daily just that obviously is gonna run out of supplies sooner and that could get him into trouble as far as his insurance is concerned. Nonetheless he at this point still continues to have pain although honestly I don't feel like it's as bad as what it was in the past just based on his reactions at this point. 06/12/18 on evaluation today patient actually appears to be doing better in regard to his lower Anthony ulcer. He's been tolerating the dressing changes without complication. Fortunately there does not appear to be any evidence of infection at this time. No fevers, chills, nausea, or vomiting noted at this time. 06/19/18 evaluation today patient's ulcer on the lower Barnes appears to be doing better at this point. he has been tolerating the dressing changes. The patient seems to be somewhat depressed about the progress of his  wound although the overall appearance at this time seems to be doing well. In general I'm very pleased with the appearance today he does have some biofilm on the surface of the wound as well as of hyper granular tissue we may want to utilize a little bit of silver nitrate today. 06/27/2018; patient comes into clinic today for a wound on the right lateral calf likely related to chronic venous insufficiency. He has been using medihoney covered with Hydrofera Blue. Wound surface actually looks quite good 07/03/2018 seen today for follow-up and management of lower Barnes ulcer right lateral shin. T olerating dressing changes. He has a small amount of biofilm today. Will attempt to remove a portion of the biofilm as he tolerates. He becomes very anxious with wound treatments. He would benefit from layer compression wraps however he refuses compression wraps or anything that smokes his legs. He expressed an inability to tolerate compression wraps. Juxta lite wraps have been ordered. He has been instructed on the appropriate application of the juxta leg wraps. His blood pressure today on visit was 200/120. He denies any blurred vision, chest pain, dizziness, shortness of breath, or difficulty with mobility. Mr. Wyka stated that he had a recent visit with his primary care provider. At that time his carvedilol was decreased from 25 mg daily to 12.5 mg daily. Strongly encouraged Mr. Bartosiewicz to seek medical attention today during visit. He declined transport for evaluation of elevated blood pressure. Encouraged him to contact his  primary care provider today for medication management. He stated that he would call his primary care doctor after wound visit today. Also encouraged him that if he experienced any symptoms of blurred vision, chest pain, shortness of breath to immediately seek medical attention. 07/17/18 on evaluation today patient's wound actually does seem to show signs of improvement based on the  overall appearance of the wound bed today. There does not appear to show any signs of infection which is good news. There is no overall worsening which is also good news. He still has hyper granular tissue will use in the [employer] followed by [facility] Dressing this point. 07/31/18 on evaluation today patient's wound bed actually show signs of improvement with good epithelialization especially in the upper portion of the wound. He has been tolerating the dressing changes without complication in general. Overall I'm extremely happy with how things stand. No fevers, chills, nausea, or vomiting noted at this time. 08/28/18 on evaluation today patient appears to be doing a little better in regard to his lower Barnes ulcer. With that being said it appears to be very hyper granular I think this is directly attributed to the fact that he continues to use the [employer] underneath the [facility] Dressing. Although this helps not to stick unfortunately also think he's not getting the benefit of the [facility] Dressing particular and subsequently is causing too much moisture buildup hence the hyper granulation. Fortunately there does not appear to be any signs of infection at this time which is good news. 09/03/18 on evaluation today patient appears to be doing well in regard to his lower Anthony ulcer. He has been tolerating the dressing changes without complication. Fortunately he has much less hyper granular tissue the noted last week I do think using silver nitrate in changing to using the [facility] Dressing without the mepitel has made a big difference for him this is good news. 09/18/18 on evaluation today patient appears to be doing excellent in regard to his right lower Anthony ulcer. This is actually significantly smaller even compared to the last evaluation. Overall I'm very pleased in this regard. I'm a recommend that we likely repeat the silver nitrate today based on what I'm  seeing. 10/02/18 on evaluation today patient appears to be doing excellent in regard to his lower Barnes wound on the right. He's been tolerating the dressing changes without complication. Fortunately there's no signs of infection at this time. Overall been very pleased with how things seem to be progressing. No fevers, chills, nausea, or vomiting noted at this time. 10/16/18 on evaluation today patient actually appears to be doing much better in regard to his lower Barnes wound on the right. This is shown signs of good improvement and is indeed measuring smaller he is on a excellent track as far as healing is concerned. My hope is this will be healed of the next several weeks. Fortunately there is no evidence of infection at this time. He does seem to be doing everything that I'm recommending for him 10/30/18 on evaluation today patient appears to be doing better in regard to his lower Barnes ulcer. He's been tolerating the dressing changes without complication. Fortunately the Hydrofera Blue Dressing to be doing the job. He has made excellent progress. With that being said this is still going somewhat slow but nonetheless is always making good progress at this point. 5/18-Patient was in to be seen for right leg ulcer that appeared after scab above it was removed today. This  area had healed completely. 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 The wound has not healed though it has been nearly 2 months since the injury occurred. He was seen in the emergency department at [facility] on May 9. At that time it was very painful and he described it as a "15 on a scale of 010". He also was found to have 3+ pitting edema to the  bilateral lower extremities. He was prescribed a weeks course of Keflex. He is here today because the wound has failed to heal and he has a prior history in our clinic. ABI in clinic today was noncompressible. He did have formal vascular studies performed in 2019 which were normal. The last hemoglobin A1c I have available for review was from March 21, 2021 and was elevated at 7.7%. The wound is fairly small and circular located about 3 inches above his right lateral malleolus. It is completely covered with eschar. No surrounding erythema, no odor, no purulent drainage. 01/11/2022: The wound on his right lateral leg is a little bit smaller today. It has reaccumulated some slough and a small bit of eschar. It remains fairly painful. He has another area on his anterior tibia that he will not let me touch but I am concerned that there is a wound forming underneath the surface. 01/18/2022: Unfortunately, his wound is a little bit bigger today. He continues to accumulate slough on the wound surface. It is still quite tender. 01/25/2022: The patient bumped his wound on the car door which resulted in bleeding. He was seen at urgent care where it was redressed. Unfortunately, the wound is bigger again today. There is accumulated slough on the surface. Urgent care gave him some tramadol, apparently and he took this prior to his visit so he could tolerate debridement better. 02/02/2022: The wound is a little bit smaller today. The surface, however, has accumulated a fairly thick layer of slough. The periwound skin is intact and there is no obvious sign of infection. 02/07/2022: The wound is a bit larger today. It has thick slough accumulation. The periwound skin is intact and there is no obvious erythema, induration, nor any odor. 02/16/2022: I took a culture last week due to the appearance of the wound and the patient's degree of pain. This grew out methicillin sensitive Staph aureus. Augmentin was prescribed. The  patient states that the pain is markedly improved today. The wound also looks much better. It is smaller with less slough buildup and there is good granulation tissue emerging. Electronic Signature(s) Signed: 02/16/2022 10:33:48 AM By: Anthony Guess MD FACS Entered By: Anthony Barnes on 02/16/2022 10:33:48 -------------------------------------------------------------------------------- Physical Exam Details Patient Name: Date of Service: Danne Barnes, Anthony Anthony E. 02/16/2022 10:00 A M Medical Record Number: [id number] Patient Account Number: [id number] Date of Birth/Sex: Treating RN: 1956-05-13 (66 y.o. Anthony Barnes Primary Care Provider: Hoy Barnes Other Clinician: Referring Provider: Treating Provider/Extender: Anthony Barnes Weeks in Treatment: 6 Constitutional Hypertensive, asymptomatic. . . . No acute distress.Marland Kitchen Respiratory Normal work of breathing on room air.. Notes 02/16/2022: The wound looks much better. It is smaller with less slough buildup and there is good granulation tissue emerging. It is much less painful. Electronic Signature(s) Signed: 02/16/2022  10:34:28 AM By: Anthony Guess MD FACS Entered By: Anthony Barnes on 02/16/2022 10:34:27 -------------------------------------------------------------------------------- Physician Orders Details Patient Name: Date of Service: Danne Barnes, Anthony Anthony E. 02/16/2022 10:00 A M Medical Record Number: [id number] Patient Account Number: [id number] Date of Birth/Sex: Treating RN: 01-31-1956 (66 y.o. Anthony Barnes Primary Care Provider: Hoy Barnes Other Clinician: Referring Provider: Treating Provider/Extender: Anthony Barnes Weeks in Treatment: 6 Verbal / Phone Orders: No Diagnosis Coding ICD-10 Coding Code Description [phone] Non-pressure chronic ulcer of other part of right lower leg with fat layer exposed E11.622 Type 2 diabetes mellitus with other skin ulcer E11.9 Type  2 diabetes mellitus without complications I10 Essential (primary) hypertension I87.2 Venous insufficiency (chronic) (peripheral) Follow-up Appointments ppointment in 1 week. - Dr. Lady Gary - Room 2 - 7/11 at 8:30 AM Return A Bathing/ Shower/ Hygiene May shower with protection but do not get wound dressing(s) wet. - may purchase a cast protector from [facility] or [facility] Edema Control - Lymphedema / SCD / Other Elevate legs to the level of the heart or above for 30 minutes daily and/or when sitting, a frequency of: Avoid standing for long periods of time. Patient to wear own compression stockings every day. Exercise regularly Moisturize legs daily. Compression stocking or Garment 20-30 mm/Hg pressure to: - to L left until R leg is healed Wound Treatment Wound #2 - Lower Leg Wound Laterality: Right, Lateral Cleanser: Soap and Water 1 x Per Week/30 Days Discharge Instructions: May shower and wash wound with dial antibacterial soap and water prior to dressing change. Cleanser: Wound Cleanser 1 x Per Week/30 Days Discharge Instructions: Cleanse the wound with wound cleanser prior to applying a clean dressing using gauze sponges, not tissue or cotton balls. Peri-Wound Care: Sween Lotion (Moisturizing lotion) 1 x Per Week/30 Days Discharge Instructions: Apply moisturizing lotion as directed Prim Dressing: Hydrofera Blue Classic Foam, 4x4 in 1 x Per Week/30 Days ary Discharge Instructions: Moisten with saline prior to applying to wound bed Prim Dressing: Santyl Ointment 1 x Per Week/30 Days ary Discharge Instructions: Apply nickel thick amount to wound bed as instructed Secondary Dressing: Woven Gauze Sponge, Non-Sterile 4x4 in 1 x Per Week/30 Days Discharge Instructions: Apply over primary dressing as directed. Secured With: Coban Self-Adherent Wrap 4x5 (in/yd) 1 x Per Week/30 Days Discharge Instructions: Secure with Coban as directed. Secured With: [employer], 4.5x3.1 (in/yd) 1 x Per  Week/30 Days Discharge Instructions: Secure with Kerlix as directed. Secured With: 77M Medipore H Soft Cloth Surgical T ape, 4 x 10 (in/yd) 1 x Per Week/30 Days Discharge Instructions: Secure with tape as directed. Electronic Signature(s) Signed: 02/16/2022 10:34:37 AM By: Anthony Guess MD FACS Entered By: Anthony Barnes on 02/16/2022 10:34:36 -------------------------------------------------------------------------------- Problem List Details Patient Name: Date of Service: Danne Barnes, Anthony Anthony E. 02/16/2022 10:00 A M Medical Record Number: [id number] Patient Account Number: [id number] Date of Birth/Sex: Treating RN: 07/07/1956 (66 y.o. Anthony Barnes Primary Care Provider: Hoy Barnes Other Clinician: Referring Provider: Treating Provider/Extender: Anthony Barnes Weeks in Treatment: 6 Active Problems ICD-10 Encounter Code Description Active Date MDM Diagnosis [phone] Non-pressure chronic ulcer of other part of right lower leg with fat layer 01/02/2022 No Yes exposed E11.622 Type 2 diabetes mellitus with other skin ulcer 01/02/2022 No Yes E11.9 Type 2 diabetes mellitus without complications 01/02/2022 No Yes I10 Essential (primary) hypertension 01/02/2022 No Yes I87.2 Venous insufficiency (chronic) (peripheral) 01/02/2022 No Yes Inactive Problems Resolved Problems Electronic Signature(s) Signed: 02/16/2022 10:31:35 AM By: Anthony Guess MD  FACS Entered By: Anthony Barnes on 02/16/2022 10:31:35 -------------------------------------------------------------------------------- Progress Note Details Patient Name: Date of Service: Anthony Barnes Anthony E. 02/16/2022 10:00 A M Medical Record Number: [id number] Patient Account Number: [id number] Date of Birth/Sex: Treating RN: [birth year] (66 y.o. Anthony Barnes Primary Care Provider: Hoy Barnes Other Clinician: Referring Provider: Treating Provider/Extender: Anthony Barnes Weeks in  Treatment: 6 Subjective Chief Complaint Information obtained from Patient Right LE Ulcer History of Present Illness (HPI) ADMISSION 03/12/18 This is a 66 year old and it was a type II diabetic reasonably poorly controlled with a recent hemoglobin A1c of 8.2. He was tending to his lawn on 01/15/18 when his weed Anthony Barnes sent a rock up word hitting him in the right anterior leg. He was seen in his primary M.D. office is same time. An x-ray showed no abnormality. He was given a course of cephalexin. Since then he's been applying peroxide and topical antibiotics to the wound. He does not have a history of chronic wounds however he does have chronic edema in the lower legs. He is complaining of pain in the right leg wound but no real history of claudication. Past medical history includes type 2 diabetes, hypertension, morbid obesity. ABIs in the right leg were noncompressible 03/19/18 on evaluation today patient appears to be tolerating the wrap in general fairly well. He states he's not having that much pain in regard to the wound itself. With that being said he is having some issues with slough buildup on the surface of the wound the Iodoflex does seem to be helpful in that regard. He is still having discomfort he wonders if I can send in a prescription for ibuprofen or something of like to help him out. I do believe that the prox end may be of benefit for him. 03/25/18 on evaluation today patient appears to be doing very well in regard to his right lateral lower Anthony Barnes ulcer. He's been tolerating the dressing changes without complication that is the wraps. Nonetheless he does continue to have pain he states is been dreading the possibility of having to have debridement again today. His first debridement experience was not optimal. With that being said he does seem to be showing signs of improvement there does appear to be little bit more granulation he does have a lot of slough that really  does need to breeding away. 04/04/18;the patient went for arterial studies that were really quite normal. ABI on the right at 1.19 with triphasic waveforms on the left 1.11 with triphasic waveforms. TBI 0.93 on the right and 0.95 on the left. This suggests T should be able to tolerate even 4 layer compression. He is however complaining of a lot of pain with our wraps I'm wondering whether the pain is simply Iodoflex. I changed him to [employer] I'm still going to try to keep him in 3 layer compression 04/12/18 on evaluation today patient actually appears to be doing rather well in regard to his right lower Anthony ulcer. He is making good progress although it is slow still I do believe that the Santyl is much better than the Iodoflex. The good news is the patient seems to be tolerating the dressing changes without complication now that we've gotten good of the Iodoflex which really did burn him quite significantly. Otherwise there's no evidence of infection. 04/17/18 on evaluation today patient actually appears to be showing signs of improvement in regard to the ulcer. Unfortunately he's had somewhat of a rough day  due to the fact that he found out that one of his friends suddenly and unexpectedly passed today. He states he's not sure he's in the frame of mind to allow me to debride the wound at this point. Nonetheless I do feel like he is showing signs of the wound getting better little by little each week. 04/24/18 on evaluation today patient's ulcer actually appears to be showing some signs of improvement albeit slow. He has been tolerating the dressing changes without complication except for the wrap which she states was so tight that he had to remove it it was causing a lot of discomfort. Fortunately there is no evidence of infection at this point. He states he only wore the wrap until about the time he got home last week and that he had to take it off. Nonetheless he does not have  any other compression stockings, Juxta-Lite wraps, or anything otherwise to really help at this point as far as compression is concerned. 05/01/18 on evaluation today patient actually appears to be doing fairly well in regard to his lower Barnes ulcer. He has been tolerating the dressing changes currently without complication. The wrap does seem to be helping as far as the fluid management is concerned. Wound bed itself actually show signs of good improvement although there is some Slough noted there's not as much as previous and he actually has fairly decent granulation noted as well. Overall I'm pleased with the progress of to this point. The patient would prefer not to have any debridement today he states he's actually not feeling too well in general and he really does not want any additional discomfort typically debridement is fairly uncomfortable for him. 05/08/18 on evaluation today patient actually appears to be doing a little better in regard to his lower Barnes ulcer. He has been tolerating the dressing changes without complication. With that being said I'm actually quite pleased with the fact the wound is not appear to be infected and in general has made a little progress. With that being said I do believe we need to perform some debridement to clean away the necrotic tissue on the surface of the wound. 05/15/18 on evaluation today patient actually appears to be doing a little better in regard to his wound. Fortunately there is some improvement noted fortunately there's also no significant evidence of infection at this point in time. He does have continued pain that really nothing different than previous he did get his Juxta-Lite wrap. 05/29/18 on evaluation today patient actually appears to be doing somewhat better in regard to his wound currently. He's been tolerating the dressing changes without complication. With that being said he has been performing the [facility] changes  every day at home. I'm pretty sure that we told him every other day last week or when I saw him rather two weeks ago. With that being said obviously did not hurt to do it daily just that obviously is gonna run out of supplies sooner and that could get him into trouble as far as his insurance is concerned. Nonetheless he at this point still continues to have pain although honestly I don't feel like it's as bad as what it was in the past just based on his reactions at this point. 06/12/18 on evaluation today patient actually appears to be doing better in regard to his lower Anthony ulcer. He's been tolerating the dressing changes without complication. Fortunately there does not appear to be any evidence of infection at this time. No  fevers, chills, nausea, or vomiting noted at this time. 06/19/18 evaluation today patient's ulcer on the lower Barnes appears to be doing better at this point. he has been tolerating the dressing changes. The patient seems to be somewhat depressed about the progress of his wound although the overall appearance at this time seems to be doing well. In general I'm very pleased with the appearance today he does have some biofilm on the surface of the wound as well as of hyper granular tissue we may want to utilize a little bit of silver nitrate today. 06/27/2018; patient comes into clinic today for a wound on the right lateral calf likely related to chronic venous insufficiency. He has been using medihoney covered with Hydrofera Blue. Wound surface actually looks quite good 07/03/2018 seen today for follow-up and management of lower Barnes ulcer right lateral shin. T olerating dressing changes. He has a small amount of biofilm today. Will attempt to remove a portion of the biofilm as he tolerates. He becomes very anxious with wound treatments. He would benefit from layer compression wraps however he refuses compression wraps or anything that smokes his legs. He expressed an  inability to tolerate compression wraps. Juxta lite wraps have been ordered. He has been instructed on the appropriate application of the juxta leg wraps. His blood pressure today on visit was 200/120. He denies any blurred vision, chest pain, dizziness, shortness of breath, or difficulty with mobility. Mr. Mckelvy stated that he had a recent visit with his primary care provider. At that time his carvedilol was decreased from 25 mg daily to 12.5 mg daily. Strongly encouraged Mr. Rockhill to seek medical attention today during visit. He declined transport for evaluation of elevated blood pressure. Encouraged him to contact his primary care provider today for medication management. He stated that he would call his primary care doctor after wound visit today. Also encouraged him that if he experienced any symptoms of blurred vision, chest pain, shortness of breath to immediately seek medical attention. 07/17/18 on evaluation today patient's wound actually does seem to show signs of improvement based on the overall appearance of the wound bed today. There does not appear to show any signs of infection which is good news. There is no overall worsening which is also good news. He still has hyper granular tissue will use in the [employer] followed by [facility] Dressing this point. 07/31/18 on evaluation today patient's wound bed actually show signs of improvement with good epithelialization especially in the upper portion of the wound. He has been tolerating the dressing changes without complication in general. Overall I'm extremely happy with how things stand. No fevers, chills, nausea, or vomiting noted at this time. 08/28/18 on evaluation today patient appears to be doing a little better in regard to his lower Barnes ulcer. With that being said it appears to be very hyper granular I think this is directly attributed to the fact that he continues to use the [employer] underneath the [facility] Dressing.  Although this helps not to stick unfortunately also think he's not getting the benefit of the [facility] Dressing particular and subsequently is causing too much moisture buildup hence the hyper granulation. Fortunately there does not appear to be any signs of infection at this time which is good news. 09/03/18 on evaluation today patient appears to be doing well in regard to his lower Anthony ulcer. He has been tolerating the dressing changes without complication. Fortunately he has much less hyper granular tissue the noted last  week I do think using silver nitrate in changing to using the [facility] Dressing without the mepitel has made a big difference for him this is good news. 09/18/18 on evaluation today patient appears to be doing excellent in regard to his right lower Anthony ulcer. This is actually significantly smaller even compared to the last evaluation. Overall I'm very pleased in this regard. I'm a recommend that we likely repeat the silver nitrate today based on what I'm seeing. 10/02/18 on evaluation today patient appears to be doing excellent in regard to his lower Barnes wound on the right. He's been tolerating the dressing changes without complication. Fortunately there's no signs of infection at this time. Overall been very pleased with how things seem to be progressing. No fevers, chills, nausea, or vomiting noted at this time. 10/16/18 on evaluation today patient actually appears to be doing much better in regard to his lower Barnes wound on the right. This is shown signs of good improvement and is indeed measuring smaller he is on a excellent track as far as healing is concerned. My hope is this will be healed of the next several weeks. Fortunately there is no evidence of infection at this time. He does seem to be doing everything that I'm recommending for him 10/30/18 on evaluation today patient appears to be doing better in regard to his lower Barnes ulcer. He's been  tolerating the dressing changes without complication. Fortunately the Hydrofera Blue Dressing to be doing the job. He has made excellent progress. With that being said this is still going somewhat slow but nonetheless is always making good progress at this point. 5/18-Patient was in to be seen for right leg ulcer that appeared after scab above it was removed today. This area had healed completely. 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 The wound has not healed though it has been nearly 2 months since the injury occurred. He was seen in the emergency department at [facility] on May 9. At that time it was very painful and he described it as a "15 on a scale of 0oo10". He also was found to have 3+ pitting edema to the bilateral lower extremities. He was prescribed a weeks course of Keflex. He is here today because the wound has failed to heal and he has a prior history in our clinic. ABI in clinic today was noncompressible. He did have formal vascular studies performed in 2019 which were normal. The last hemoglobin A1c I have available for review was from March 21, 2021 and was elevated at 7.7%. The wound is fairly small and circular located about 3 inches above his right lateral malleolus. It is completely covered with eschar. No surrounding erythema, no odor, no purulent drainage. 01/11/2022: The wound on his right lateral leg is a little bit smaller today. It has reaccumulated some slough and a small bit of eschar. It remains fairly painful. He has another area on his anterior tibia that he will not let me touch but I am concerned that there is a wound forming underneath the surface. 01/18/2022: Unfortunately, his wound is a  little bit bigger today. He continues to accumulate slough on the wound surface. It is still quite tender. 01/25/2022: The  patient bumped his wound on the car door which resulted in bleeding. He was seen at urgent care where it was redressed. Unfortunately, the wound is bigger again today. There is accumulated slough on the surface. Urgent care gave him some tramadol, apparently and he took this prior to his visit so he could tolerate debridement better. 02/02/2022: The wound is a little bit smaller today. The surface, however, has accumulated a fairly thick layer of slough. The periwound skin is intact and there is no obvious sign of infection. 02/07/2022: The wound is a bit larger today. It has thick slough accumulation. The periwound skin is intact and there is no obvious erythema, induration, nor any odor. 02/16/2022: I took a culture last week due to the appearance of the wound and the patient's degree of pain. This grew out methicillin sensitive Staph aureus. Augmentin was prescribed. The patient states that the pain is markedly improved today. The wound also looks much better. It is smaller with less slough buildup and there is good granulation tissue emerging. Patient History Information obtained from Patient. Family History Cancer - Mother, Diabetes - Mother,Father, Hypertension - Mother,Father, Kidney Disease - Siblings, Stroke - Father, No family history of Heart Disease, Hereditary Spherocytosis, Lung Disease, Seizures, Thyroid Problems, Tuberculosis. Social History Former smoker - ended on 08/14/1990, Marital Status - Married, Alcohol Use - Moderate, Drug Use - No History, Caffeine Use - Daily. Medical History Eyes Denies history of Cataracts, Glaucoma, Optic Neuritis Ear/Nose/Mouth/Throat Denies history of Chronic sinus problems/congestion, Middle ear problems Hematologic/Lymphatic Denies history of Anemia, Hemophilia, Human Immunodeficiency Virus, Lymphedema, Sickle Cell  Disease Respiratory Denies history of Aspiration, Asthma, Chronic Obstructive Pulmonary Disease (COPD), Pneumothorax, Sleep Apnea, Tuberculosis Cardiovascular Patient has history of Hypertension Denies history of Angina, Arrhythmia, Congestive Heart Failure, Coronary Artery Disease, Deep Vein Thrombosis, Hypotension, Myocardial Infarction, Peripheral Arterial Disease, Peripheral Venous Disease, Phlebitis, Vasculitis Gastrointestinal Denies history of Cirrhosis , Colitis, Crohnoos, Hepatitis A, Hepatitis B, Hepatitis C Endocrine Patient has history of Type II Diabetes Genitourinary Denies history of End Stage Renal Disease Immunological Denies history of Lupus Erythematosus, Raynaudoos, Scleroderma Integumentary (Skin) Denies history of History of Burn Musculoskeletal Denies history of Gout, Rheumatoid Arthritis, Osteoarthritis, Osteomyelitis Neurologic Denies history of Dementia, Neuropathy, Quadriplegia, Paraplegia, Seizure Disorder Oncologic Denies history of Received Chemotherapy, Received Radiation Psychiatric Denies history of Anorexia/bulimia, Confinement Anxiety Hospitalization/Surgery History - esophagogastroduodenoscopy. - brain aneurysm surgery. Medical A Surgical History Notes nd Constitutional Symptoms (General Health) obesity Gastrointestinal diverticulitis Neurologic neuropathy Objective Constitutional Hypertensive, asymptomatic. No acute distress.. Vitals Time Taken: 10:15 AM, Height: 75 in, Weight: 310 lbs, BMI: 38.7, Temperature: 98.3 F, Pulse: 74 bpm, Respiratory Rate: 18 breaths/min, Blood Pressure: 153/71 mmHg. Respiratory Normal work of breathing on room air.. General Notes: 02/16/2022: The wound looks much better. It is smaller with less slough buildup and there is good granulation tissue emerging. It is much less painful. Integumentary (Hair, Skin) Wound #2 status is Open. Original cause of wound was Trauma. The date acquired was: 12/03/2021. The  wound has been in treatment 6 weeks. The wound is located on the Right,Lateral Lower Leg. The wound measures 3.2cm length x 1.8cm width x 0.1cm depth; 4.524cm^2 area and 0.452cm^3 volume. There is Fat Layer (Subcutaneous Tissue) exposed. There is no tunneling or undermining noted. There is a medium amount of serosanguineous drainage noted. The wound margin is distinct with the outline attached to the wound base. There is large (67-100%) red granulation within the wound bed. There is a small (1-33%) amount of necrotic  tissue within the wound bed including Adherent Slough. Assessment Active Problems ICD-10 Non-pressure chronic ulcer of other part of right lower leg with fat layer exposed Type 2 diabetes mellitus with other skin ulcer Type 2 diabetes mellitus without complications Essential (primary) hypertension Venous insufficiency (chronic) (peripheral) Procedures Wound #2 Pre-procedure diagnosis of Wound #2 is a Venous Leg Ulcer located on the Right,Lateral Lower Leg .Severity of Tissue Pre Debridement is: Fat layer exposed. There was a Excisional Skin/Subcutaneous Tissue Debridement with a total area of 5.76 sq cm performed by Anthony Guess, MD. With the following instrument(s): Curette to remove Viable and Non-Viable tissue/material. Material removed includes Subcutaneous Tissue and Slough and after achieving pain control using Lidocaine 5% topical ointment. No specimens were taken. A time out was conducted at 10:25, prior to the start of the procedure. A Minimum amount of bleeding was controlled with Pressure. The procedure was tolerated well with a pain level of 8 throughout and a pain level of 2 following the procedure. Post Debridement Measurements: 3.2cm length x 1.8cm width x 0.1cm depth; 0.452cm^3 volume. Character of Wound/Ulcer Post Debridement is improved. Severity of Tissue Post Debridement is: Fat layer exposed. Post procedure Diagnosis Wound #2: Same as  Pre-Procedure Plan Follow-up Appointments: Return Appointment in 1 week. - Dr. Lady Gary - Room 2 - 7/11 at 8:30 AM Bathing/ Shower/ Hygiene: May shower with protection but do not get wound dressing(s) wet. - may purchase a cast protector from [facility] or [facility] Edema Control - Lymphedema / SCD / Other: Elevate legs to the level of the heart or above for 30 minutes daily and/or when sitting, a frequency of: Avoid standing for long periods of time. Patient to wear own compression stockings every day. Exercise regularly Moisturize legs daily. Compression stocking or Garment 20-30 mm/Hg pressure to: - to L left until R leg is healed WOUND #2: - Lower Leg Wound Laterality: Right, Lateral Cleanser: Soap and Water 1 x Per Week/30 Days Discharge Instructions: May shower and wash wound with dial antibacterial soap and water prior to dressing change. Cleanser: Wound Cleanser 1 x Per Week/30 Days Discharge Instructions: Cleanse the wound with wound cleanser prior to applying a clean dressing using gauze sponges, not tissue or cotton balls. Peri-Wound Care: Sween Lotion (Moisturizing lotion) 1 x Per Week/30 Days Discharge Instructions: Apply moisturizing lotion as directed Prim Dressing: Hydrofera Blue Classic Foam, 4x4 in 1 x Per Week/30 Days ary Discharge Instructions: Moisten with saline prior to applying to wound bed Prim Dressing: Santyl Ointment 1 x Per Week/30 Days ary Discharge Instructions: Apply nickel thick amount to wound bed as instructed Secondary Dressing: Woven Gauze Sponge, Non-Sterile 4x4 in 1 x Per Week/30 Days Discharge Instructions: Apply over primary dressing as directed. Secured With: Coban Self-Adherent Wrap 4x5 (in/yd) 1 x Per Week/30 Days Discharge Instructions: Secure with Coban as directed. Secured With: [employer], 4.5x3.1 (in/yd) 1 x Per Week/30 Days Discharge Instructions: Secure with Kerlix as directed. Secured With: 41M Medipore H Soft Cloth Surgical T ape, 4  x 10 (in/yd) 1 x Per Week/30 Days Discharge Instructions: Secure with tape as directed. 02/16/2022: The wound looks much better. It is smaller with less slough buildup and there is good granulation tissue emerging. I used a curette to debride the slough and nonviable subcutaneous tissue from the wound. We will continue Santyl, Hydrofera Blue, and 3 layer compression. He will complete his course of oral antibiotics. Follow-up in 1 week. Electronic Signature(s) Signed: 02/16/2022 10:35:14 AM By: Anthony Guess MD FACS  Signed: 02/16/2022 10:35:14 AM By: Anthony Guess MD FACS Entered By: Anthony Barnes on 02/16/2022 10:35:13 -------------------------------------------------------------------------------- HxROS Details Patient Name: Date of Service: Danne Barnes, Anthony Anthony E. 02/16/2022 10:00 A M Medical Record Number: [id number] Patient Account Number: [id number] Date of Birth/Sex: Treating RN: 25-May-1956 (66 y.o. Anthony Barnes Primary Care Provider: Hoy Barnes Other Clinician: Referring Provider: Treating Provider/Extender: Anthony Barnes Weeks in Treatment: 6 Information Obtained From Patient Constitutional Symptoms (General Health) Medical History: Past Medical History Notes: obesity Eyes Medical History: Negative for: Cataracts; Glaucoma; Optic Neuritis Ear/Nose/Mouth/Throat Medical History: Negative for: Chronic sinus problems/congestion; Middle ear problems Hematologic/Lymphatic Medical History: Negative for: Anemia; Hemophilia; Human Immunodeficiency Virus; Lymphedema; Sickle Cell Disease Respiratory Medical History: Negative for: Aspiration; Asthma; Chronic Obstructive Pulmonary Disease (COPD); Pneumothorax; Sleep Apnea; Tuberculosis Cardiovascular Medical History: Positive for: Hypertension Negative for: Angina; Arrhythmia; Congestive Heart Failure; Coronary Artery Disease; Deep Vein Thrombosis; Hypotension; Myocardial Infarction;  Peripheral Arterial Disease; Peripheral Venous Disease; Phlebitis; Vasculitis Gastrointestinal Medical History: Negative for: Cirrhosis ; Colitis; Crohns; Hepatitis A; Hepatitis B; Hepatitis C Past Medical History Notes: diverticulitis Endocrine Medical History: Positive for: Type II Diabetes Time with diabetes: 8 years Treated with: Oral agents Blood sugar tested every day: No Genitourinary Medical History: Negative for: End Stage Renal Disease Immunological Medical History: Negative for: Lupus Erythematosus; Raynauds; Scleroderma Integumentary (Skin) Medical History: Negative for: History of Burn Musculoskeletal Medical History: Negative for: Gout; Rheumatoid Arthritis; Osteoarthritis; Osteomyelitis Neurologic Medical History: Negative for: Dementia; Neuropathy; Quadriplegia; Paraplegia; Seizure Disorder Past Medical History Notes: neuropathy Oncologic Medical History: Negative for: Received Chemotherapy; Received Radiation Psychiatric Medical History: Negative for: Anorexia/bulimia; Confinement Anxiety Immunizations Pneumococcal Vaccine: Received Pneumococcal Vaccination: No Immunization Notes: tetanus 2 years ago Implantable Devices None Hospitalization / Surgery History Type of Hospitalization/Surgery esophagogastroduodenoscopy brain aneurysm surgery Family and Social History Cancer: Yes - Mother; Diabetes: Yes - Mother,Father; Heart Disease: No; Hereditary Spherocytosis: No; Hypertension: Yes - Mother,Father; Kidney Disease: Yes - Siblings; Lung Disease: No; Seizures: No; Stroke: Yes - Father; Thyroid Problems: No; Tuberculosis: No; Former smoker - ended on 08/14/1990; Marital Status - Married; Alcohol Use: Moderate; Drug Use: No History; Caffeine Use: Daily; Financial Concerns: No; Food, Clothing or Shelter Needs: No; Support System Lacking: No; Transportation Concerns: No Electronic Signature(s) Signed: 02/16/2022 12:58:53 PM By: Anthony Guess MD  FACS Signed: 02/16/2022 4:34:44 PM By: Gelene Mink By: Anthony Barnes on 02/16/2022 10:33:54 -------------------------------------------------------------------------------- SuperBill Details Patient Name: Date of Service: Danne Barnes, Anthony Anthony E. 02/16/2022 Medical Record Number: [id number] Patient Account Number: [id number] Date of Birth/Sex: Treating RN: 1955/08/24 (66 y.o. Anthony Barnes Primary Care Provider: Hoy Barnes Other Clinician: Referring Provider: Treating Provider/Extender: Anthony Barnes Weeks in Treatment: 6 Diagnosis Coding ICD-10 Codes Code Description [phone] Non-pressure chronic ulcer of other part of right lower leg with fat layer exposed E11.622 Type 2 diabetes mellitus with other skin ulcer E11.9 Type 2 diabetes mellitus without complications I10 Essential (primary) hypertension I87.2 Venous insufficiency (chronic) (peripheral) Facility Procedures CPT4 Code: [id number] Description: 11042 - DEB SUBQ TISSUE 20 SQ CM/< ICD-10 Diagnosis Description 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 Modifier: osed Quantity: 1 Physician Procedures : CPT4 Code Description Modifier [id number] 99213 - WC PHYS LEVEL 3 - EST PT 25 ICD-10 Diagnosis Description 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osed E11.622 Type 2 diabetes mellitus with other skin ulcer  I10 Essential (primary) hypertension I87.2 Venous insufficiency (chronic) (peripheral) Quantity:  1 : [id number] 11042 - WC PHYS SUBQ TISS 20 SQ CM ICD-10 Diagnosis Description 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osed Quantity: 1 Electronic Signature(s) Signed: 02/16/2022 10:35:38 AM By: Anthony Guess MD FACS Entered By: Anthony Barnes on 02/16/2022 10:35:38

## 2022-02-16 NOTE — Progress Notes (Signed)
Bingley, Trinton E. ([id number]) Visit Report for 02/16/2022 Arrival Information Details Patient Name: Date of Service: GLA DNEY, [name] 02/16/2022 10:00 A M Medical Record Number: [id number] Patient Account Number: [id number] Date of Birth/Sex: Treating RN: 12/23/1955 (66 y.o. M) Herrington, Taylor Primary Care Provider: Newlin, Enobong Other Clinician: Referring Provider: Treating Provider/Extender: Cannon, Jennifer Newlin, Enobong Weeks in Treatment: 6 Visit Information History Since Last Visit Added or deleted any medications: No Patient Arrived: Ambulatory Any new allergies or adverse reactions: No Arrival Time: 10:14 Had a fall or experienced change in No Accompanied By: self activities of daily living that may affect Transfer Assistance: None risk of falls: Patient Identification Verified: Yes Signs or symptoms of abuse/neglect since last visito No Secondary Verification Process Completed: Yes Hospitalized since last visit: No Patient Requires Transmission-Based Precautions: No Implantable device outside of the clinic excluding No cellular tissue based products placed in the center since last visit: Has Dressing in Place as Prescribed: Yes Has Compression in Place as Prescribed: Yes Pain Present Now: Yes Electronic Signature(s) Signed: 02/16/2022 4:34:44 PM By: Herrington, Taylor Entered By: Herrington, Taylor on 02/16/2022 10:15:26 -------------------------------------------------------------------------------- Encounter Discharge Information Details Patient Name: Date of Service: GLA DNEY, [name] 02/16/2022 10:00 A M Medical Record Number: [id number] Patient Account Number: [id number] Date of Birth/Sex: Treating RN: 04/18/1956 (66 y.o. M) Herrington, Taylor Primary Care Provider: Newlin, Enobong Other Clinician: Referring Provider: Treating Provider/Extender: Cannon, Jennifer Newlin, Enobong Weeks in Treatment: 6 Encounter Discharge Information Items Post  Procedure Vitals Discharge Condition: Stable Temperature (F): 98.3 Ambulatory Status: Ambulatory Pulse (bpm): 74 Discharge Destination: Home Respiratory Rate (breaths/min): 18 Transportation: Private Auto Blood Pressure (mmHg): 153/71 Accompanied By: self Schedule Follow-up Appointment: Yes Clinical Summary of Care: Patient Declined Electronic Signature(s) Signed: 02/16/2022 4:34:44 PM By: Herrington, Taylor Entered By: Herrington, Taylor on 02/16/2022 12:39:29 -------------------------------------------------------------------------------- Lower Extremity Assessment Details Patient Name: Date of Service: GLA DNEY, [name] 02/16/2022 10:00 A M Medical Record Number: [id number] Patient Account Number: [id number] Date of Birth/Sex: Treating RN: 11/17/1955 (66 y.o. M) Herrington, Taylor Primary Care Provider: Newlin, Enobong Other Clinician: Referring Provider: Treating Provider/Extender: Cannon, Jennifer Newlin, Enobong Weeks in Treatment: 6 Edema Assessment Assessed: [Left: No] [Right: No] E[Left: dema] [Right: :] Calf Left: Right: Point of Measurement: From Medial Instep 42.5 cm Ankle Left: Right: Point of Measurement: From Medial Instep 26.6 cm Vascular Assessment Pulses: Dorsalis Pedis Palpable: [Right:Yes] Electronic Signature(s) Signed: 02/16/2022 4:34:44 PM By: Herrington, Taylor Entered By: Herrington, Taylor on 02/16/2022 10:17:30 -------------------------------------------------------------------------------- Multi Wound Chart Details Patient Name: Date of Service: GLA DNEY, [name] 02/16/2022 10:00 A M Medical Record Number: [id number] Patient Account Number: [id number] Date of Birth/Sex: Treating RN: 03/09/1956 (66 y.o. M) Herrington, Taylor Primary Care Provider: Newlin, Enobong Other Clinician: Referring Provider: Treating Provider/Extender: Cannon, Jennifer Newlin, Enobong Weeks in Treatment: 6 Vital Signs Height(in): 75 Pulse(bpm):  74 Weight(lbs): 310 Blood Pressure(mmHg): 153/71 Body Mass Index(BMI): 38.7 Temperature(°F): 98.3 Respiratory Rate(breaths/min): 18 Photos: [N/A:N/A] Right, Lateral Lower Leg N/A N/A Wound Location: Trauma N/A N/A Wounding Event: Venous Leg Ulcer N/A N/A Primary Etiology: Hypertension, Type II Diabetes N/A N/A Comorbid History: 12/03/2021 N/A N/A Date Acquired: 6 N/A N/A Weeks of Treatment: Open N/A N/A Wound Status: No N/A N/A Wound Recurrence: 3.2x1.8x0.1 N/A N/A Measurements L x W x D (cm) 4.524 N/A N/A A (cm) : rea 0.452 N/A N/A Volume (cm) : -326.80% N/A N/A % Reduction in A rea: -326.40% N/A N/A % Reduction in Volume: Full Thickness Without Exposed N/A N/A Classification:   Support Structures Medium N/A N/A Exudate A mount: Serosanguineous N/A N/A Exudate Type: red, brown N/A N/A Exudate Color: Distinct, outline attached N/A N/A Wound Margin: Large (67-100%) N/A N/A Granulation A mount: Red N/A N/A Granulation Quality: Small (1-33%) N/A N/A Necrotic A mount: Fat Layer (Subcutaneous Tissue): Yes N/A N/A Exposed Structures: Fascia: No Tendon: No Muscle: No Joint: No Bone: No Small (1-33%) N/A N/A Epithelialization: Debridement - Excisional N/A N/A Debridement: Pre-procedure Verification/Time Out 10:25 N/A N/A Taken: Lidocaine 5% topical ointment N/A N/A Pain Control: Subcutaneous, Slough N/A N/A Tissue Debrided: Skin/Subcutaneous Tissue N/A N/A Level: 5.76 N/A N/A Debridement A (sq cm): rea Curette N/A N/A Instrument: Minimum N/A N/A Bleeding: Pressure N/A N/A Hemostasis A chieved: 8 N/A N/A Procedural Pain: 2 N/A N/A Post Procedural Pain: Procedure was tolerated well N/A N/A Debridement Treatment Response: 3.2x1.8x0.1 N/A N/A Post Debridement Measurements L x W x D (cm) 0.452 N/A N/A Post Debridement Volume: (cm) Debridement N/A N/A Procedures Performed: Treatment Notes Electronic Signature(s) Signed: 02/16/2022  10:31:51 AM By: Fredirick Maudlin MD FACS Signed: 02/16/2022 4:34:44 PM By: Adline Peals Entered By: Fredirick Maudlin on 02/16/2022 10:31:50 -------------------------------------------------------------------------------- Multi-Disciplinary Care Plan Details Patient Name: Date of Service: [name], [name] 02/16/2022 10:00 A M Medical Record Number: [id number] Patient Account Number: [id number] Date of Birth/Sex: Treating RN: 01-21-1956 (66 y.o. [name] Primary Care Johna Kearl: Charlott Rakes Other Clinician: Referring Kiandre Spagnolo: Treating Nicoles Sedlacek/Extender: Carter Kitten Weeks in Treatment: 6 Active Inactive Venous Leg Ulcer Nursing Diagnoses: Actual venous Insuffiency (use after diagnosis is confirmed) Knowledge deficit related to disease process and management Goals: Patient will maintain optimal edema control Date Initiated: 01/02/2022 Target Resolution Date: 03/09/2022 Goal Status: Active Patient/caregiver will verbalize understanding of disease process and disease management Date Initiated: 01/02/2022 Target Resolution Date: 03/09/2022 Goal Status: Active Interventions: Assess peripheral edema status every visit. Compression as ordered Provide education on venous insufficiency Treatment Activities: Non-invasive vascular studies : 01/02/2022 T ordered outside of clinic : 01/02/2022 est Therapeutic compression applied : 01/02/2022 Notes: Wound/Skin Impairment Nursing Diagnoses: Impaired tissue integrity Knowledge deficit related to ulceration/compromised skin integrity Goals: Patient/caregiver will verbalize understanding of skin care regimen Date Initiated: 01/02/2022 Date Inactivated: 02/02/2022 Target Resolution Date: 02/03/2022 Goal Status: Met Ulcer/skin breakdown will have a volume reduction of 30% by week 4 Date Initiated: 01/02/2022 Target Resolution Date: 03/09/2022 Goal Status: Active Interventions: Assess ulceration(s) every  visit Provide education on ulcer and skin care Treatment Activities: Skin care regimen initiated : 01/02/2022 Topical wound management initiated : 01/02/2022 Notes: Electronic Signature(s) Signed: 02/16/2022 4:34:44 PM By: Adline Peals Entered By: Adline Peals on 02/16/2022 10:19:33 -------------------------------------------------------------------------------- Pain Assessment Details Patient Name: Date of Service: [name], [place]. 02/16/2022 10:00 [place] Record Number: [id number] Patient Account Number: [id number] Date of Birth/Sex: Treating RN: [birth year] (66 y.o. [name] Primary Care Laurabeth Yip: Charlott Rakes Other Clinician: Referring Nadeem Romanoski: Treating [place]/Extender: Carter Kitten Weeks in Treatment: 6 Active Problems Location of Pain Severity and Description of Pain Patient Has Paino Yes Site Locations Pain Location: Pain Location: Pain in Ulcers Duration of the Pain. Constant / Intermittento Intermittent Rate the pain. Current Pain Level: 5 Character of Pain Describe the Pain: Sharp, Shooting Pain Management and Medication Current Pain Management: Medication: Yes Electronic Signature(s) Signed: 02/16/2022 4:34:44 PM By: Adline Peals Entered By: Adline Peals on 02/16/2022 10:16:08 -------------------------------------------------------------------------------- Patient/Caregiver Education Details Patient Name: Date of Service: [name], [name] 7/6/2023andnbsp10:00 A M Medical Record Number: [id number] Patient Account  Number: [id number] Date of Birth/Gender: Treating RN: 02/01/1956 (66 y.o. [name] Primary Care Physician: Charlott Rakes Other Clinician: Referring Physician: Treating Physician/Extender: Carter Kitten Weeks in Treatment: 6 Education Assessment Education Provided To: Patient Education Topics Provided Wound/Skin Impairment: Methods:  Explain/Verbal Responses: Reinforcements needed, State content correctly Electronic Signature(s) Signed: 02/16/2022 4:34:44 PM By: Adline Peals Entered By: Adline Peals on 02/16/2022 10:19:43 -------------------------------------------------------------------------------- Wound Assessment Details Patient Name: Date of Service: [name], [name] 02/16/2022 10:00 A M Medical Record Number: [id number] Patient Account Number: [id number] Date of Birth/Sex: Treating RN: 19-Jul-1956 (66 y.o. [name] Primary Care Anitra Doxtater: Charlott Rakes Other Clinician: Referring Donnis Pecha: Treating Elwyn Lowden/Extender: Carter Kitten Weeks in Treatment: 6 Wound Status Wound Number: 2 Primary Etiology: Venous Leg Ulcer Wound Location: Right, Lateral Lower Leg Wound Status: Open Wounding Event: Trauma Comorbid History: Hypertension, Type II Diabetes Date Acquired: 12/03/2021 Weeks Of Treatment: 6 Clustered Wound: No Photos Wound Measurements Length: (cm) 3.2 Width: (cm) 1.8 Depth: (cm) 0.1 Area: (cm) 4.524 Volume: (cm) 0.452 % Reduction in Area: -326.8% % Reduction in Volume: -326.4% Epithelialization: Small (1-33%) Tunneling: No Undermining: No Wound Description Classification: Full Thickness Without Exposed Support Structures Wound Margin: Distinct, outline attached Exudate Amount: Medium Exudate Type: Serosanguineous Exudate Color: red, brown Foul Odor After Cleansing: No Slough/Fibrino Yes Wound Bed Granulation Amount: Large (67-100%) Exposed Structure Granulation Quality: Red Fascia Exposed: No Necrotic Amount: Small (1-33%) Fat Layer (Subcutaneous Tissue) Exposed: Yes Necrotic Quality: Adherent Slough Tendon Exposed: No Muscle Exposed: No Joint Exposed: No Bone Exposed: No Treatment Notes Wound #2 (Lower Leg) Wound Laterality: Right, Lateral Cleanser Soap and Water Discharge Instruction: May shower and wash wound with dial  antibacterial soap and water prior to dressing change. Wound Cleanser Discharge Instruction: Cleanse the wound with wound cleanser prior to applying a clean dressing using gauze sponges, not tissue or cotton balls. Peri-Wound Care Sween Lotion (Moisturizing lotion) Discharge Instruction: Apply moisturizing lotion as directed Topical Primary Dressing Hydrofera Blue Classic Foam, 4x4 in Discharge Instruction: Moisten with saline prior to applying to wound bed Santyl Ointment Discharge Instruction: Apply nickel thick amount to wound bed as instructed Secondary Dressing Woven Gauze Sponge, Non-Sterile 4x4 in Discharge Instruction: Apply over primary dressing as directed. Secured With [employer] 4x5 (in/yd) Discharge Instruction: Secure with Coban as directed. Kerlix Roll Sterile, 4.5x3.1 (in/yd) Discharge Instruction: Secure with Kerlix as directed. 21M Medipore H Soft Cloth Surgical T ape, 4 x 10 (in/yd) Discharge Instruction: Secure with tape as directed. Compression Wrap Compression Stockings Add-Ons Electronic Signature(s) Signed: 02/16/2022 4:34:44 PM By: Adline Peals Entered By: Adline Peals on 02/16/2022 10:19:10 -------------------------------------------------------------------------------- Vitals Details Patient Name: Date of Service: [name], [place]. 02/16/2022 10:00 A M Medical Record Number: [id number] Patient Account Number: [id number] Date of Birth/Sex: Treating RN: 09/23/1955 (66 y.o. [name] Primary Care Taunja Brickner: Charlott Rakes Other Clinician: Referring Dasja Brase: Treating Iley Breeden/Extender: Carter Kitten Weeks in Treatment: 6 Vital Signs Time Taken: 10:15 Temperature (F): 98.3 Height (in): 75 Pulse (bpm): 74 Weight (lbs): 310 Respiratory Rate (breaths/min): 18 Body Mass Index (BMI): 38.7 Blood Pressure (mmHg): 153/71 Reference Range: 80 - 120 mg / dl Electronic Signature(s) Signed:  02/16/2022 4:34:44 PM By: Adline Peals Entered By: Adline Peals on 02/16/2022 10:15:40

## 2022-02-21 ENCOUNTER — Encounter (HOSPITAL_BASED_OUTPATIENT_CLINIC_OR_DEPARTMENT_OTHER): Payer: Medicare Other | Admitting: General Surgery

## 2022-02-21 DIAGNOSIS — E11622 Type 2 diabetes mellitus with other skin ulcer: Secondary | ICD-10-CM | POA: Diagnosis not present

## 2022-02-21 DIAGNOSIS — I872 Venous insufficiency (chronic) (peripheral): Secondary | ICD-10-CM | POA: Diagnosis not present

## 2022-02-21 DIAGNOSIS — L97812 Non-pressure chronic ulcer of other part of right lower leg with fat layer exposed: Secondary | ICD-10-CM | POA: Diagnosis not present

## 2022-02-21 DIAGNOSIS — I10 Essential (primary) hypertension: Secondary | ICD-10-CM | POA: Diagnosis not present

## 2022-02-21 DIAGNOSIS — E119 Type 2 diabetes mellitus without complications: Secondary | ICD-10-CM | POA: Diagnosis not present

## 2022-02-21 NOTE — Progress Notes (Signed)
Anthony Barnes, Anthony Barnes ([id number]) Visit Report for 02/21/2022 Arrival Information Details Patient Name: Date of Service: Anthony Barnes [name] 02/21/2022 8:30 A M Medical Record Number: [id number] Patient Account Number: [id number] Date of Birth/Sex: Treating RN: 10/21/1955 (65 y.o. [name] Primary Care Dary Dilauro: Charlott Rakes Other Clinician: Referring Victoriano Campion: Treating Irish Piech/Extender: Carter Kitten Weeks in Treatment: 7 Visit Information History Since Last Visit Added or deleted any medications: No Patient Arrived: Ambulatory Any new allergies or adverse reactions: No Arrival Time: 11:46 Had a fall or experienced change in No Accompanied By: self activities of daily living that may affect Transfer Assistance: None risk of falls: Patient Identification Verified: Yes Signs or symptoms of abuse/neglect since last visito No Secondary Verification Process Completed: Yes Hospitalized since last visit: No Patient Requires Transmission-Based Precautions: No Implantable device outside of the clinic excluding No cellular tissue based products placed in the center since last visit: Has Dressing in Place as Prescribed: Yes Has Compression in Place as Prescribed: Yes Pain Present Now: No Electronic Signature(s) Signed: 02/21/2022 4:46:37 PM By: Adline Peals Entered By: Adline Peals on 02/21/2022 11:47:01 -------------------------------------------------------------------------------- Encounter Discharge Information Details Patient Name: Date of Service: Anthony Barnes, Anthony [name] 02/21/2022 8:30 A M Medical Record Number: [id number] Patient Account Number: [id number] Date of Birth/Sex: Treating RN: 03/13/1956 (66 y.o. [name] Primary Care Lalo Tromp: Charlott Rakes Other Clinician: Referring Keion Neels: Treating Nikoleta Dady/Extender: Carter Kitten Weeks in Treatment: 7 Encounter Discharge Information Items Post  Procedure Vitals Discharge Condition: Stable Temperature (F): 97.6 Ambulatory Status: Ambulatory Pulse (bpm): 78 Discharge Destination: Home Respiratory Rate (breaths/min): 18 Transportation: Private Auto Blood Pressure (mmHg): 178/91 Accompanied By: self Schedule Follow-up Appointment: Yes Clinical Summary of Care: Patient Declined Electronic Signature(s) Signed: 02/21/2022 4:46:37 PM By: Adline Peals Entered By: Adline Peals on 02/21/2022 12:16:54 -------------------------------------------------------------------------------- Lower Extremity Assessment Details Patient Name: Date of Service: Anthony Barnes [name] 02/21/2022 8:30 A M Medical Record Number: [id number] Patient Account Number: [id number] Date of Birth/Sex: Treating RN: 02/22/1956 (66 y.o. [name] Primary Care Nevia Henkin: Charlott Rakes Other Clinician: Referring Misheel Gowans: Treating Norma Montemurro/Extender: Carter Kitten Weeks in Treatment: 7 Edema Assessment Assessed: [Left: No] [Right: No] E[Left: dema] [Right: :] Calf Left: Right: Point of Measurement: From Medial Instep 42.5 cm Ankle Left: Right: Point of Measurement: From Medial Instep 27.3 cm Vascular Assessment Pulses: Dorsalis Pedis Palpable: [Right:Yes] Electronic Signature(s) Signed: 02/21/2022 4:46:37 PM By: Adline Peals Entered By: Adline Peals on 02/21/2022 11:47:42 -------------------------------------------------------------------------------- Multi Wound Chart Details Patient Name: Date of Service: Anthony Barnes, Anthony [name] 02/21/2022 8:30 A M Medical Record Number: [id number] Patient Account Number: [id number] Date of Birth/Sex: Treating RN: 1956/07/29 (66 y.o. [name] Primary Care Mayela Bullard: Charlott Rakes Other Clinician: Referring Morgin Halls: Treating Aerilyn Slee/Extender: Carter Kitten Weeks in Treatment: 7 Wound Assessments Treatment Notes Electronic  Signature(s) Signed: 02/21/2022 10:40:18 AM By: Fredirick Maudlin MD FACS Signed: 02/21/2022 4:46:37 PM By: Sabas Sous By: Fredirick Maudlin on 02/21/2022 10:40:18 -------------------------------------------------------------------------------- Multi-Disciplinary Care Plan Details Patient Name: Date of Service: Anthony Barnes, Anthony [name] 02/21/2022 8:30 A M Medical Record Number: [id number] Patient Account Number: [id number] Date of Birth/Sex: Treating RN: [birth year] (66 y.o. [name] Primary Care Lissete Maestas: Charlott Rakes Other Clinician: Referring Adrian Specht: Treating Mace Weinberg/Extender: Carter Kitten Weeks in Treatment: 7 Active Inactive Venous Leg Ulcer Nursing Diagnoses: Actual venous Insuffiency (use after diagnosis is confirmed) Knowledge deficit related to disease process and management Goals: Patient will maintain optimal edema control  Date Initiated: 01/02/2022 Target Resolution Date: 03/09/2022 Goal Status: Active Patient/caregiver will verbalize understanding of disease process and disease management Date Initiated: 01/02/2022 Target Resolution Date: 03/09/2022 Goal Status: Active Interventions: Assess peripheral edema status every visit. Compression as ordered Provide education on venous insufficiency Treatment Activities: Non-invasive vascular studies : 01/02/2022 T ordered outside of clinic : 01/02/2022 est Therapeutic compression applied : 01/02/2022 Notes: Wound/Skin Impairment Nursing Diagnoses: Impaired tissue integrity Knowledge deficit related to ulceration/compromised skin integrity Goals: Patient/caregiver will verbalize understanding of skin care regimen Date Initiated: 01/02/2022 Date Inactivated: 02/02/2022 Target Resolution Date: 02/03/2022 Goal Status: Met Ulcer/skin breakdown will have a volume reduction of 30% by week 4 Date Initiated: 01/02/2022 Target Resolution Date: 03/09/2022 Goal Status:  Active Interventions: Assess ulceration(s) every visit Provide education on ulcer and skin care Treatment Activities: Skin care regimen initiated : 01/02/2022 Topical wound management initiated : 01/02/2022 Notes: Electronic Signature(s) Signed: 02/21/2022 4:46:37 PM By: Adline Peals Entered By: Adline Peals on 02/21/2022 12:15:02 -------------------------------------------------------------------------------- Pain Assessment Details Patient Name: Date of Service: Anthony Barnes, Anthony [name] 02/21/2022 8:30 A M Medical Record Number: [id number] Patient Account Number: [id number] Date of Birth/Sex: Treating RN: [birth year] (66 y.o. [name] Primary Care Traxton Kolenda: Other Clinician: Charlott Rakes Referring Dammon Makarewicz: Treating Anahit Klumb/Extender: Carter Kitten Weeks in Treatment: 7 Active Problems Location of Pain Severity and Description of Pain Patient Has Paino No Site Locations Rate the pain. Current Pain Level: 0 Pain Management and Medication Current Pain Management: Electronic Signature(s) Signed: 02/21/2022 4:46:37 PM By: Adline Peals Entered By: Adline Peals on 02/21/2022 11:47:29 -------------------------------------------------------------------------------- Patient/Caregiver Education Details Patient Name: Date of Service: Anthony Barnes, Anthony [name] 7/11/2023andnbsp8:30 A M Medical Record Number: [id number] Patient Account Number: [id number] Date of Birth/Gender: Treating RN: 04/02/1956 (66 y.o. [name] Primary Care Physician: Charlott Rakes Other Clinician: Referring Physician: Treating Physician/Extender: Carter Kitten Weeks in Treatment: 7 Education Assessment Education Provided To: Patient Education Topics Provided Wound/Skin Impairment: Methods: Explain/Verbal Responses: Reinforcements needed, State content correctly Electronic Signature(s) Signed: 02/21/2022 4:46:37 PM By:  Adline Peals Entered By: Adline Peals on 02/21/2022 12:15:14 -------------------------------------------------------------------------------- Wound Assessment Details Patient Name: Date of Service: Anthony Barnes [name] 02/21/2022 8:30 A M Medical Record Number: [id number] Patient Account Number: [id number] Date of Birth/Sex: Treating RN: 26-Mar-1956 (66 y.o. [name] Primary Care Lamine Laton: Charlott Rakes Other Clinician: Referring Antion Andres: Treating Lawan Nanez/Extender: Carter Kitten Weeks in Treatment: 7 Wound Status Wound Number: 2 Primary Etiology: Venous Leg Ulcer Wound Location: Right, Lateral Lower Leg Wound Status: Open Wounding Event: Trauma Comorbid History: Hypertension, Type II Diabetes Date Acquired: 12/03/2021 Weeks Of Treatment: 7 Clustered Wound: No Wound Measurements Length: (cm) 3.6 Width: (cm) 2.5 Depth: (cm) 0.1 Area: (cm) 7.069 Volume: (cm) 0.707 % Reduction in Area: -566.9% % Reduction in Volume: -567% Epithelialization: Small (1-33%) Tunneling: No Undermining: No Wound Description Classification: Full Thickness Without Exposed Support Structures Wound Margin: Distinct, outline attached Exudate Amount: Medium Exudate Type: Serosanguineous Exudate Color: red, brown Foul Odor After Cleansing: No Slough/Fibrino Yes Wound Bed Granulation Amount: Medium (34-66%) Exposed Structure Granulation Quality: Red Fascia Exposed: No Necrotic Amount: Medium (34-66%) Fat Layer (Subcutaneous Tissue) Exposed: Yes Necrotic Quality: Adherent Slough Tendon Exposed: No Muscle Exposed: No Joint Exposed: No Bone Exposed: No Treatment Notes Wound #2 (Lower Leg) Wound Laterality: Right, Lateral Cleanser Soap and Water Discharge Instruction: May shower and wash wound with dial antibacterial soap and water prior to dressing change. Wound Cleanser Discharge Instruction: Cleanse the wound with wound  cleanser prior to  applying a clean dressing using gauze sponges, not tissue or cotton balls. Peri-Wound Care Sween Lotion (Moisturizing lotion) Discharge Instruction: Apply moisturizing lotion as directed Topical Primary Dressing Hydrofera Blue Classic Foam, 4x4 in Discharge Instruction: Moisten with saline prior to applying to wound bed Santyl Ointment Discharge Instruction: Apply nickel thick amount to wound bed as instructed Secondary Dressing Woven Gauze Sponge, Non-Sterile 4x4 in Discharge Instruction: Apply over primary dressing as directed. Secured With [employer] 4x5 (in/yd) Discharge Instruction: Secure with Coban as directed. Kerlix Roll Sterile, 4.5x3.1 (in/yd) Discharge Instruction: Secure with Kerlix as directed. 28M Medipore H Soft Cloth Surgical T ape, 4 x 10 (in/yd) Discharge Instruction: Secure with tape as directed. Compression Wrap Compression Stockings Add-Ons Electronic Signature(s) Signed: 02/21/2022 4:46:37 PM By: Adline Peals Entered By: Adline Peals on 02/21/2022 12:13:44 -------------------------------------------------------------------------------- Vitals Details Patient Name: Date of Service: Anthony Barnes, Anthony [name] 02/21/2022 8:30 A M Medical Record Number: [id number] Patient Account Number: [id number] Date of Birth/Sex: Treating RN: [birth year] (65 y.o. [name] Primary Care Liliahna Cudd: Charlott Rakes Other Clinician: Referring Sophi Calligan: Treating Berkley Cronkright/Extender: Carter Kitten Weeks in Treatment: 7 Vital Signs Time Taken: 08:40 Temperature (F): 97.6 Height (in): 75 Pulse (bpm): 78 Weight (lbs): 310 Respiratory Rate (breaths/min): 18 Body Mass Index (BMI): 38.7 Blood Pressure (mmHg): 178/91 Reference Range: 80 - 120 mg / dl Electronic Signature(s) Signed: 02/21/2022 4:46:37 PM By: Adline Peals Entered By: Adline Peals on 02/21/2022 11:47:20

## 2022-02-21 NOTE — Progress Notes (Addendum)
Anthony Barnes, Anthony Barnes ([id number]) Visit Report for 02/21/2022 Chief Complaint Document Details Patient Name: Date of Service: Anthony Barnes [name] 02/21/2022 8:30 A M Medical Record Number: [id number] Patient Account Number: [id number] Date of Birth/Sex: Treating RN: Aug 05, 1956 (66 y.o. [name] Primary Care Provider: Charlott Rakes Other Clinician: Referring Provider: Treating Provider/Extender: Carter Kitten Weeks in Treatment: 7 Information Obtained from: Patient Chief Complaint Right LE Ulcer Electronic Signature(s) Signed: 02/21/2022 10:40:25 AM By: Fredirick Maudlin MD FACS Entered By: Fredirick Maudlin on 02/21/2022 10:40:25 -------------------------------------------------------------------------------- Debridement Details Patient Name: Date of Service: Anthony Barnes, [name] 02/21/2022 8:30 A M Medical Record Number: [id number] Patient Account Number: [id number] Date of Birth/Sex: Treating RN: 1956/06/02 (66 y.o. [name] Primary Care Provider: Charlott Rakes Other Clinician: Referring Provider: Treating Provider/Extender: Carter Kitten Weeks in Treatment: 7 Debridement Performed for Assessment: Wound #2 Right,Lateral Lower Leg Performed By: Physician Fredirick Maudlin, MD Debridement Type: Debridement Severity of Tissue Pre Debridement: Fat layer exposed Level of Consciousness (Pre-procedure): Awake and Alert Pre-procedure Verification/Time Out Yes - 09:01 Taken: Start Time: 09:01 Pain Control: Lidocaine 5% topical ointment T Area Debrided (L x W): otal 3.6 (cm) x 2.5 (cm) = 9 (cm) Tissue and other material debrided: Viable, Non-Viable, Slough, Subcutaneous, Slough Level: Skin/Subcutaneous Tissue Debridement Description: Excisional Instrument: Curette Bleeding: Minimum Hemostasis Achieved: Pressure Procedural Pain: 8 Post Procedural Pain: 2 Response to Treatment: Procedure was tolerated well Level of  Consciousness (Post- Awake and Alert procedure): Post Debridement Measurements of Total Wound Length: (cm) 3.6 Width: (cm) 2.5 Depth: (cm) 0.1 Volume: (cm) 0.707 Character of Wound/Ulcer Post Debridement: Improved Severity of Tissue Post Debridement: Fat layer exposed Post Procedure Diagnosis Same as Pre-procedure Electronic Signature(s) Signed: 02/21/2022 4:27:02 PM By: Fredirick Maudlin MD FACS Signed: 02/21/2022 4:46:37 PM By: Adline Peals Entered By: Adline Peals on 02/21/2022 12:14:51 -------------------------------------------------------------------------------- HPI Details Patient Name: Date of Service: Anthony Barnes, [name] 02/21/2022 8:30 A M Medical Record Number: [id number] Patient Account Number: [id number] Date of Birth/Sex: Treating RN: Oct 16, 1955 (66 y.o. [name] Primary Care Provider: Charlott Rakes Other Clinician: Referring Provider: Treating Provider/Extender: Carter Kitten Weeks in Treatment: 7 History of Present Illness HPI Description: ADMISSION 03/12/18 This is a 66 year old and it was a type II diabetic reasonably poorly controlled with a recent hemoglobin A1c of 8.2. He was tending to his lawn on 01/15/18 when his weed [name] sent a rock up word hitting him in the right anterior leg. He was seen in his primary M.D. office is same time. An x-ray showed no abnormality. He was given a course of cephalexin. Since then he's been applying peroxide and topical antibiotics to the wound. He does not have a history of chronic wounds however he does have chronic edema in the lower legs. He is complaining of pain in the right leg wound but no real history of claudication. Past medical history includes type 2 diabetes, hypertension, morbid obesity. ABIs in the right leg were noncompressible 03/19/18 on evaluation today patient appears to be tolerating the wrap in general fairly well. He states he's not having that much pain in  regard to the wound itself. With that being said he is having some issues with slough buildup on the surface of the wound the Iodoflex does seem to be helpful in that regard. He is still having discomfort he wonders if I can send in a prescription for ibuprofen or something of like to help him out. I do believe that  the prox end may be of benefit for him. 03/25/18 on evaluation today patient appears to be doing very well in regard to his right lateral lower [name]. He's been tolerating the dressing changes without complication that is the wraps. Nonetheless he does continue to have pain he states is been dreading the possibility of having to have debridement again today. His first debridement experience was not optimal. With that being said he does seem to be showing signs of improvement there does appear to be little bit more granulation he does have a lot of slough that really does need to breeding away. 04/04/18;the patient went for arterial studies that were really quite normal. ABI on the right at 1.19 with triphasic waveforms on the left 1.11 with triphasic waveforms. TBI 0.93 on the right and 0.95 on the left. This suggests T should be able to tolerate even 4 layer compression. He is however complaining of a lot of pain with our wraps I'm wondering whether the pain is simply Iodoflex. I changed him to [employer]. I'm still going to try to keep him in 3 layer compression 04/12/18 on evaluation today patient actually appears to be doing rather well in regard to his right lower [name] ulcer. He is making good progress although it is slow still I do believe that the Santyl is much better than the Iodoflex. The good news is the patient seems to be tolerating the dressing changes without complication now that we've gotten good of the Iodoflex which really did burn him quite significantly. Otherwise there's no evidence of infection. 04/17/18 on evaluation today  patient actually appears to be showing signs of improvement in regard to the ulcer. Unfortunately he's had somewhat of a rough day due to the fact that he found out that one of his friends suddenly and unexpectedly passed today. He states he's not sure he's in the frame of mind to allow me to debride the wound at this point. Nonetheless I do feel like he is showing signs of the wound getting better little by little each week. 04/24/18 on evaluation today patient's ulcer actually appears to be showing some signs of improvement albeit slow. He has been tolerating the dressing changes without complication except for the wrap which she states was so tight that he had to remove it it was causing a lot of discomfort. Fortunately there is no evidence of infection at this point. He states he only wore the wrap until about the time he got home last week and that he had to take it off. Nonetheless he does not have any other compression stockings, Juxta-Lite wraps, or anything otherwise to really help at this point as far as compression is concerned. 05/01/18 on evaluation today patient actually appears to be doing fairly well in regard to his lower extremity ulcer. He has been tolerating the dressing changes currently without complication. The wrap does seem to be helping as far as the fluid management is concerned. Wound bed itself actually show signs of good improvement although there is some Slough noted there's not as much as previous and he actually has fairly decent granulation noted as well. Overall I'm pleased with the progress of to this point. The patient would prefer not to have any debridement today he states he's actually not feeling too well in general and he really does not want any additional discomfort typically debridement is fairly uncomfortable for him. 05/08/18 on evaluation today patient actually appears to be doing a  little better in regard to his lower extremity ulcer. He has been tolerating  the dressing changes without complication. With that being said I'm actually quite pleased with the fact the wound is not appear to be infected and in general has made a little progress. With that being said I do believe we need to perform some debridement to clean away the necrotic tissue on the surface of the wound. 05/15/18 on evaluation today patient actually appears to be doing a little better in regard to his wound. Fortunately there is some improvement noted fortunately there's also no significant evidence of infection at this point in time. He does have continued pain that really nothing different than previous he did get his Juxta-Lite wrap. 05/29/18 on evaluation today patient actually appears to be doing somewhat better in regard to his wound currently. He's been tolerating the dressing changes without complication. With that being said he has been performing the [facility] Dressing changes every day at home. I'm pretty sure that we told him every other day last week or when I saw him rather two weeks ago. With that being said obviously did not hurt to do it daily just that obviously is gonna run out of supplies sooner and that could get him into trouble as far as his insurance is concerned. Nonetheless he at this point still continues to have pain although honestly I don't feel like it's as bad as what it was in the past just based on his reactions at this point. 06/12/18 on evaluation today patient actually appears to be doing better in regard to his lower [name] ulcer. He's been tolerating the dressing changes without complication. Fortunately there does not appear to be any evidence of infection at this time. No fevers, chills, nausea, or vomiting noted at this time. 06/19/18 evaluation today patient's ulcer on the lower extremity appears to be doing better at this point. he has been tolerating the dressing changes. The patient seems to be somewhat depressed about the progress of his  wound although the overall appearance at this time seems to be doing well. In general I'm very pleased with the appearance today he does have some biofilm on the surface of the wound as well as of hyper granular tissue we may want to utilize a little bit of silver nitrate today. 06/27/2018; patient comes into clinic today for a wound on the right lateral calf likely related to chronic venous insufficiency. He has been using medihoney covered with Hydrofera Blue. Wound surface actually looks quite good 07/03/2018 seen today for follow-up and management of lower extremity ulcer right lateral shin. T olerating dressing changes. He has a small amount of biofilm today. Will attempt to remove a portion of the biofilm as he tolerates. He becomes very anxious with wound treatments. He would benefit from layer compression wraps however he refuses compression wraps or anything that smokes his legs. He expressed an inability to tolerate compression wraps. Juxta lite wraps have been ordered. He has been instructed on the appropriate application of the juxta leg wraps. His blood pressure today on visit was 200/120. He denies any blurred vision, chest pain, dizziness, shortness of breath, or difficulty with mobility. Mr. Sulewski stated that he had a recent visit with his primary care provider. At that time his carvedilol was decreased from 25 mg daily to 12.5 mg daily. Strongly encouraged Mr. Douyon to seek medical attention today during visit. He declined transport for evaluation of elevated blood pressure. Encouraged him to contact his  primary care provider today for medication management. He stated that he would call his primary care doctor after wound visit today. Also encouraged him that if he experienced any symptoms of blurred vision, chest pain, shortness of breath to immediately seek medical attention. 07/17/18 on evaluation today patient's wound actually does seem to show signs of improvement based on the  overall appearance of the wound bed today. There does not appear to show any signs of infection which is good news. There is no overall worsening which is also good news. He still has hyper granular tissue will use in the [employer] followed by [facility] Dressing this point. 07/31/18 on evaluation today patient's wound bed actually show signs of improvement with good epithelialization especially in the upper portion of the wound. He has been tolerating the dressing changes without complication in general. Overall I'm extremely happy with how things stand. No fevers, chills, nausea, or vomiting noted at this time. 08/28/18 on evaluation today patient appears to be doing a little better in regard to his lower extremity ulcer. With that being said it appears to be very hyper granular I think this is directly attributed to the fact that he continues to use the [employer] underneath the [facility] Dressing. Although this helps not to stick unfortunately also think he's not getting the benefit of the [facility] Dressing particular and subsequently is causing too much moisture buildup hence the hyper granulation. Fortunately there does not appear to be any signs of infection at this time which is good news. 09/03/18 on evaluation today patient appears to be doing well in regard to his lower [name] ulcer. He has been tolerating the dressing changes without complication. Fortunately he has much less hyper granular tissue the noted last week I do think using silver nitrate in changing to using the [facility] Dressing without the mepitel has made a big difference for him this is good news. 09/18/18 on evaluation today patient appears to be doing excellent in regard to his right lower [name] ulcer. This is actually significantly smaller even compared to the last evaluation. Overall I'm very pleased in this regard. I'm a recommend that we likely repeat the silver nitrate today based on what I'm  seeing. 10/02/18 on evaluation today patient appears to be doing excellent in regard to his lower extremity wound on the right. He's been tolerating the dressing changes without complication. Fortunately there's no signs of infection at this time. Overall been very pleased with how things seem to be progressing. No fevers, chills, nausea, or vomiting noted at this time. 10/16/18 on evaluation today patient actually appears to be doing much better in regard to his lower extremity wound on the right. This is shown signs of good improvement and is indeed measuring smaller he is on a excellent track as far as healing is concerned. My hope is this will be healed of the next several weeks. Fortunately there is no evidence of infection at this time. He does seem to be doing everything that I'm recommending for him 10/30/18 on evaluation today patient appears to be doing better in regard to his lower extremity ulcer. He's been tolerating the dressing changes without complication. Fortunately the Hydrofera Blue Dressing to be doing the job. He has made excellent progress. With that being said this is still going somewhat slow but nonetheless is always making good progress at this point. 5/18-Patient was in to be seen for right leg ulcer that appeared after scab above it was removed today. This  area had healed completely. 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 The wound has not healed though it has been nearly 2 months since the injury occurred. He was seen in the emergency department at [facility] on May 9. At that time it was very painful and he described it as a "15 on a scale of 010". He also was found to have 3+ pitting edema to the  bilateral lower extremities. He was prescribed a weeks course of Keflex. He is here today because the wound has failed to heal and he has a prior history in our clinic. ABI in clinic today was noncompressible. He did have formal vascular studies performed in 2019 which were normal. The last hemoglobin A1c I have available for review was from March 21, 2021 and was elevated at 7.7%. The wound is fairly small and circular located about 3 inches above his right lateral malleolus. It is completely covered with eschar. No surrounding erythema, no odor, no purulent drainage. 01/11/2022: The wound on his right lateral leg is a little bit smaller today. It has reaccumulated some slough and a small bit of eschar. It remains fairly painful. He has another area on his anterior tibia that he will not let me touch but I am concerned that there is a wound forming underneath the surface. 01/18/2022: Unfortunately, his wound is a little bit bigger today. He continues to accumulate slough on the wound surface. It is still quite tender. 01/25/2022: The patient bumped his wound on the car door which resulted in bleeding. He was seen at urgent care where it was redressed. Unfortunately, the wound is bigger again today. There is accumulated slough on the surface. Urgent care gave him some tramadol, apparently and he took this prior to his visit so he could tolerate debridement better. 02/02/2022: The wound is a little bit smaller today. The surface, however, has accumulated a fairly thick layer of slough. The periwound skin is intact and there is no obvious sign of infection. 02/07/2022: The wound is a bit larger today. It has thick slough accumulation. The periwound skin is intact and there is no obvious erythema, induration, nor any odor. 02/16/2022: I took a culture last week due to the appearance of the wound and the patient's degree of pain. This grew out methicillin sensitive Staph aureus. Augmentin was prescribed. The  patient states that the pain is markedly improved today. The wound also looks much better. It is smaller with less slough buildup and there is good granulation tissue emerging. 02/21/2022: The patient's pain is quite improved from prior visits. The wound is about the same size and has some accumulated slough on the surface. The base is a little bit fibrotic but there are some areas of granulation tissue filling in. Electronic Signature(s) Signed: 02/21/2022 10:41:18 AM By: Fredirick Maudlin MD FACS Entered By: Fredirick Maudlin on 02/21/2022 10:41:18 -------------------------------------------------------------------------------- Physical Exam Details Patient Name: Date of Service: Anthony Barnes, [name] 02/21/2022 8:30 A M Medical Record Number: [id number] Patient Account Number: [id number] Date of Birth/Sex: Treating RN: 04/23/1956 (66 y.o. [name] Primary Care Provider: Charlott Rakes Other Clinician: Referring Provider: Treating Provider/Extender: Carter Kitten Weeks in Treatment: 7 Constitutional No acute distress.Marland Kitchen Respiratory  Normal work of breathing on room air.. Notes 02/21/2022: The wound is about the same size and has some accumulated slough on the surface. The base is a little bit fibrotic but there are some areas of granulation tissue filling in. Electronic Signature(s) Signed: 02/21/2022 10:42:12 AM By: Fredirick Maudlin MD FACS Entered By: Fredirick Maudlin on 02/21/2022 10:42:12 -------------------------------------------------------------------------------- Physician Orders Details Patient Name: Date of Service: Anthony Barnes, [name] 02/21/2022 8:30 A M Medical Record Number: [id number] Patient Account Number: [id number] Date of Birth/Sex: Treating RN: [birth year] (66 y.o. [name] Primary Care Provider: Charlott Rakes Other Clinician: Referring Provider: Treating Provider/Extender: Carter Kitten Weeks in  Treatment: 7 Verbal / Phone Orders: No Diagnosis Coding ICD-10 Coding Code Description [phone] Non-pressure chronic ulcer of other part of right lower leg with fat layer exposed E11.622 Type 2 diabetes mellitus with other skin ulcer E11.9 Type 2 diabetes mellitus without complications [id number] Essential (primary) hypertension I87.2 Venous insufficiency (chronic) (peripheral) Follow-up Appointments ppointment in 1 week. - Dr. Celine Ahr - Room 2 - Return A Bathing/ Shower/ Hygiene May shower with protection but do not get wound dressing(s) wet. - may purchase a cast protector from [place] or [facility] Edema Control - Lymphedema / SCD / Other Elevate legs to the level of the heart or above for 30 minutes daily and/or when sitting, a frequency of: Avoid standing for long periods of time. Patient to wear own compression stockings every day. Exercise regularly Moisturize legs daily. Compression stocking or Garment 20-30 mm/Hg pressure to: - to L left until R leg is healed Wound Treatment Wound #2 - Lower Leg Wound Laterality: Right, Lateral Cleanser: Soap and Water 1 x Per Week/30 Days Discharge Instructions: May shower and wash wound with dial antibacterial soap and water prior to dressing change. Cleanser: Wound Cleanser 1 x Per Week/30 Days Discharge Instructions: Cleanse the wound with wound cleanser prior to applying a clean dressing using gauze sponges, not tissue or cotton balls. Peri-Wound Care: Sween Lotion (Moisturizing lotion) 1 x Per Week/30 Days Discharge Instructions: Apply moisturizing lotion as directed Prim Dressing: Hydrofera Blue Classic Foam, 4x4 in 1 x Per Week/30 Days ary Discharge Instructions: Moisten with saline prior to applying to wound bed Prim Dressing: Santyl Ointment 1 x Per Week/30 Days ary Discharge Instructions: Apply nickel thick amount to wound bed as instructed Secondary Dressing: Woven Gauze Sponge, Non-Sterile 4x4 in 1 x Per Week/30 Days Discharge  Instructions: Apply over primary dressing as directed. Secured With: Coban Self-Adherent Wrap 4x5 (in/yd) 1 x Per Week/30 Days Discharge Instructions: Secure with Coban as directed. Secured With: [employer], 4.5x3.1 (in/yd) 1 x Per Week/30 Days Discharge Instructions: Secure with Kerlix as directed. Secured With: 45M Medipore H Soft Cloth Surgical T ape, 4 x 10 (in/yd) 1 x Per Week/30 Days Discharge Instructions: Secure with tape as directed. Electronic Signature(s) Signed: 02/21/2022 4:27:02 PM By: Fredirick Maudlin MD FACS Signed: 02/21/2022 4:46:37 PM By: Sabas Sous By: Adline Peals on 02/21/2022 12:15:57 -------------------------------------------------------------------------------- Problem List Details Patient Name: Date of Service: Anthony Barnes, [name] 02/21/2022 8:30 A M Medical Record Number: [id number] Patient Account Number: [id number] Date of Birth/Sex: Treating RN: [birth year] (66 y.o. [name] Primary Care Provider: Charlott Rakes Other Clinician: Referring Provider: Treating Provider/Extender: Carter Kitten Weeks in Treatment: 7 Active Problems ICD-10 Encounter Code Description Active Date MDM Diagnosis L97.812 Non-pressure chronic ulcer of other part of right lower leg with fat layer 01/02/2022 No Yes exposed E11.622 Type 2  diabetes mellitus with other skin ulcer 01/02/2022 No Yes E11.9 Type 2 diabetes mellitus without complications 01/02/2022 No Yes I10 Essential (primary) hypertension 01/02/2022 No Yes I87.2 Venous insufficiency (chronic) (peripheral) 01/02/2022 No Yes Inactive Problems Resolved Problems Electronic Signature(s) Signed: 02/21/2022 10:40:14 AM By: Duanne Guess MD FACS Entered By: Duanne Guess on 02/21/2022 10:40:14 -------------------------------------------------------------------------------- Progress Note Details Patient Name: Date of Service: [name], [name] 02/21/2022  8:30 A M Medical Record Number: [id number] Patient Account Number: [id number] Date of Birth/Sex: Treating RN: 05-12-1956 (66 y.o. [name] Primary Care Provider: Hoy Register Other Clinician: Referring Provider: Treating Provider/Extender: Camillo Flaming Weeks in Treatment: 7 Subjective Chief Complaint Information obtained from Patient Right LE Ulcer History of Present Illness (HPI) ADMISSION 03/12/18 This is a 66 year old and it was a type II diabetic reasonably poorly controlled with a recent hemoglobin A1c of 8.2. He was tending to his lawn on 01/15/18 when his weed [name] sent a rock up word hitting him in the right anterior leg. He was seen in his primary M.D. office is same time. An x-ray showed no abnormality. He was given a course of cephalexin. Since then he's been applying peroxide and topical antibiotics to the wound. He does not have a history of chronic wounds however he does have chronic edema in the lower legs. He is complaining of pain in the right leg wound but no real history of claudication. Past medical history includes type 2 diabetes, hypertension, morbid obesity. ABIs in the right leg were noncompressible 03/19/18 on evaluation today patient appears to be tolerating the wrap in general fairly well. He states he's not having that much pain in regard to the wound itself. With that being said he is having some issues with slough buildup on the surface of the wound the Iodoflex does seem to be helpful in that regard. He is still having discomfort he wonders if I can send in a prescription for ibuprofen or something of like to help him out. I do believe that the prox end may be of benefit for him. 03/25/18 on evaluation today patient appears to be doing very well in regard to his right lateral lower [name]. He's been tolerating the dressing changes without complication that is the wraps. Nonetheless he does continue to have  pain he states is been dreading the possibility of having to have debridement again today. His first debridement experience was not optimal. With that being said he does seem to be showing signs of improvement there does appear to be little bit more granulation he does have a lot of slough that really does need to breeding away. 04/04/18;the patient went for arterial studies that were really quite normal. ABI on the right at 1.19 with triphasic waveforms on the left 1.11 with triphasic waveforms. TBI 0.93 on the right and 0.95 on the left. This suggests T should be able to tolerate even 4 layer compression. He is however complaining of a lot of pain with our wraps I'm wondering whether the pain is simply Iodoflex. I changed him to [employer] I'm still going to try to keep him in 3 layer compression 04/12/18 on evaluation today patient actually appears to be doing rather well in regard to his right lower [name] ulcer. He is making good progress although it is slow still I do believe that the Santyl is much better than the Iodoflex. The good news is the patient seems to be tolerating the dressing changes without  complication now that we've gotten good of the Iodoflex which really did burn him quite significantly. Otherwise there's no evidence of infection. 04/17/18 on evaluation today patient actually appears to be showing signs of improvement in regard to the ulcer. Unfortunately he's had somewhat of a rough day due to the fact that he found out that one of his friends suddenly and unexpectedly passed today. He states he's not sure he's in the frame of mind to allow me to debride the wound at this point. Nonetheless I do feel like he is showing signs of the wound getting better little by little each week. 04/24/18 on evaluation today patient's ulcer actually appears to be showing some signs of improvement albeit slow. He has been tolerating the dressing changes without complication  except for the wrap which she states was so tight that he had to remove it it was causing a lot of discomfort. Fortunately there is no evidence of infection at this point. He states he only wore the wrap until about the time he got home last week and that he had to take it off. Nonetheless he does not have any other compression stockings, Juxta-Lite wraps, or anything otherwise to really help at this point as far as compression is concerned. 05/01/18 on evaluation today patient actually appears to be doing fairly well in regard to his lower extremity ulcer. He has been tolerating the dressing changes currently without complication. The wrap does seem to be helping as far as the fluid management is concerned. Wound bed itself actually show signs of good improvement although there is some Slough noted there's not as much as previous and he actually has fairly decent granulation noted as well. Overall I'm pleased with the progress of to this point. The patient would prefer not to have any debridement today he states he's actually not feeling too well in general and he really does not want any additional discomfort typically debridement is fairly uncomfortable for him. 05/08/18 on evaluation today patient actually appears to be doing a little better in regard to his lower extremity ulcer. He has been tolerating the dressing changes without complication. With that being said I'm actually quite pleased with the fact the wound is not appear to be infected and in general has made a little progress. With that being said I do believe we need to perform some debridement to clean away the necrotic tissue on the surface of the wound. 05/15/18 on evaluation today patient actually appears to be doing a little better in regard to his wound. Fortunately there is some improvement noted fortunately there's also no significant evidence of infection at this point in time. He does have continued pain that really nothing  different than previous he did get his Juxta-Lite wrap. 05/29/18 on evaluation today patient actually appears to be doing somewhat better in regard to his wound currently. He's been tolerating the dressing changes without complication. With that being said he has been performing the [facility] Dressing changes every day at home. I'm pretty sure that we told him every other day last week or when I saw him rather two weeks ago. With that being said obviously did not hurt to do it daily just that obviously is gonna run out of supplies sooner and that could get him into trouble as far as his insurance is concerned. Nonetheless he at this point still continues to have pain although honestly I don't feel like it's as bad as what it was in the past just  based on his reactions at this point. 06/12/18 on evaluation today patient actually appears to be doing better in regard to his lower [name] ulcer. He's been tolerating the dressing changes without complication. Fortunately there does not appear to be any evidence of infection at this time. No fevers, chills, nausea, or vomiting noted at this time. 06/19/18 evaluation today patient's ulcer on the lower extremity appears to be doing better at this point. he has been tolerating the dressing changes. The patient seems to be somewhat depressed about the progress of his wound although the overall appearance at this time seems to be doing well. In general I'm very pleased with the appearance today he does have some biofilm on the surface of the wound as well as of hyper granular tissue we may want to utilize a little bit of silver nitrate today. 06/27/2018; patient comes into clinic today for a wound on the right lateral calf likely related to chronic venous insufficiency. He has been using medihoney covered with Hydrofera Blue. Wound surface actually looks quite good 07/03/2018 seen today for follow-up and management of lower extremity ulcer right lateral  shin. T olerating dressing changes. He has a small amount of biofilm today. Will attempt to remove a portion of the biofilm as he tolerates. He becomes very anxious with wound treatments. He would benefit from layer compression wraps however he refuses compression wraps or anything that smokes his legs. He expressed an inability to tolerate compression wraps. Juxta lite wraps have been ordered. He has been instructed on the appropriate application of the juxta leg wraps. His blood pressure today on visit was 200/120. He denies any blurred vision, chest pain, dizziness, shortness of breath, or difficulty with mobility. Mr. Reina stated that he had a recent visit with his primary care provider. At that time his carvedilol was decreased from 25 mg daily to 12.5 mg daily. Strongly encouraged Mr. Ooley to seek medical attention today during visit. He declined transport for evaluation of elevated blood pressure. Encouraged him to contact his primary care provider today for medication management. He stated that he would call his primary care doctor after wound visit today. Also encouraged him that if he experienced any symptoms of blurred vision, chest pain, shortness of breath to immediately seek medical attention. 07/17/18 on evaluation today patient's wound actually does seem to show signs of improvement based on the overall appearance of the wound bed today. There does not appear to show any signs of infection which is good news. There is no overall worsening which is also good news. He still has hyper granular tissue will use in the [employer] followed by [facility] Dressing this point. 07/31/18 on evaluation today patient's wound bed actually show signs of improvement with good epithelialization especially in the upper portion of the wound. He has been tolerating the dressing changes without complication in general. Overall I'm extremely happy with how things stand. No fevers, chills, nausea,  or vomiting noted at this time. 08/28/18 on evaluation today patient appears to be doing a little better in regard to his lower extremity ulcer. With that being said it appears to be very hyper granular I think this is directly attributed to the fact that he continues to use the [employer] underneath the [facility] Dressing. Although this helps not to stick unfortunately also think he's not getting the benefit of the [facility] Dressing particular and subsequently is causing too much moisture buildup hence the hyper granulation. Fortunately there does not appear to be  any signs of infection at this time which is good news. 09/03/18 on evaluation today patient appears to be doing well in regard to his lower [name] ulcer. He has been tolerating the dressing changes without complication. Fortunately he has much less hyper granular tissue the noted last week I do think using silver nitrate in changing to using the [facility] Dressing without the mepitel has made a big difference for him this is good news. 09/18/18 on evaluation today patient appears to be doing excellent in regard to his right lower [name] ulcer. This is actually significantly smaller even compared to the last evaluation. Overall I'm very pleased in this regard. I'm a recommend that we likely repeat the silver nitrate today based on what I'm seeing. 10/02/18 on evaluation today patient appears to be doing excellent in regard to his lower extremity wound on the right. He's been tolerating the dressing changes without complication. Fortunately there's no signs of infection at this time. Overall been very pleased with how things seem to be progressing. No fevers, chills, nausea, or vomiting noted at this time. 10/16/18 on evaluation today patient actually appears to be doing much better in regard to his lower extremity wound on the right. This is shown signs of good improvement and is indeed measuring smaller he is on a excellent track  as far as healing is concerned. My hope is this will be healed of the next several weeks. Fortunately there is no evidence of infection at this time. He does seem to be doing everything that I'm recommending for him 10/30/18 on evaluation today patient appears to be doing better in regard to his lower extremity ulcer. He's been tolerating the dressing changes without complication. Fortunately the Hydrofera Blue Dressing to be doing the job. He has made excellent progress. With that being said this is still going somewhat slow but nonetheless is always making good progress at this point. 5/18-Patient was in to be seen for right leg ulcer that appeared after scab above it was removed today. This area had healed completely. 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 The wound has not healed though it has been nearly 2 months since the injury occurred. He was seen in the emergency department at [facility] on May 9. At that time it was very painful and he described it as a "15 on a scale of 0oo10". He also was found to have 3+ pitting edema to the bilateral lower extremities. He was prescribed a weeks course of Keflex. He is here today because the wound has failed to heal and he has a prior history in our clinic. ABI in clinic today was noncompressible. He did have formal vascular studies performed in 2019 which were normal. The last hemoglobin A1c I have available for review was from March 21, 2021 and was elevated at 7.7%. The wound is fairly small and circular located about 3 inches above his right lateral malleolus. It is completely covered with eschar. No surrounding erythema, no odor, no purulent drainage. 01/11/2022: The  wound on his right lateral leg is a little bit smaller today. It has reaccumulated some slough and a small bit of eschar. It remains fairly painful. He has another area on his  anterior tibia that he will not let me touch but I am concerned that there is a wound forming underneath the surface. 01/18/2022: Unfortunately, his wound is a little bit bigger today. He continues to accumulate slough on the wound surface. It is still quite tender. 01/25/2022: The patient bumped his wound on the car door which resulted in bleeding. He was seen at urgent care where it was redressed. Unfortunately, the wound is bigger again today. There is accumulated slough on the surface. Urgent care gave him some tramadol, apparently and he took this prior to his visit so he could tolerate debridement better. 02/02/2022: The wound is a little bit smaller today. The surface, however, has accumulated a fairly thick layer of slough. The periwound skin is intact and there is no obvious sign of infection. 02/07/2022: The wound is a bit larger today. It has thick slough accumulation. The periwound skin is intact and there is no obvious erythema, induration, nor any odor. 02/16/2022: I took a culture last week due to the appearance of the wound and the patient's degree of pain. This grew out methicillin sensitive Staph aureus. Augmentin was prescribed. The patient states that the pain is markedly improved today. The wound also looks much better. It is smaller with less slough buildup and there is good granulation tissue emerging. 02/21/2022: The patient's pain is quite improved from prior visits. The wound is about the same size and has some accumulated slough on the surface. The base is a little bit fibrotic but there are some areas of granulation tissue filling in. Patient History Information obtained from Patient. Family History Cancer - Mother, Diabetes - Mother,Father, Hypertension - Mother,Father, Kidney Disease - Siblings, Stroke -  Father, No family history of Heart Disease, Hereditary Spherocytosis, Lung Disease, Seizures, Thyroid Problems, Tuberculosis. Social History Former smoker - ended on 08/14/1990, Marital Status - Married, Alcohol Use - Moderate, Drug Use - No History, Caffeine Use - Daily. Medical History Eyes Denies history of Cataracts, Glaucoma, Optic Neuritis Ear/Nose/Mouth/Throat Denies history of Chronic sinus problems/congestion, Middle ear problems Hematologic/Lymphatic Denies history of Anemia, Hemophilia, Human Immunodeficiency Virus, Lymphedema, Sickle Cell Disease Respiratory Denies history of Aspiration, Asthma, Chronic Obstructive Pulmonary Disease (COPD), Pneumothorax, Sleep Apnea, Tuberculosis Cardiovascular Patient has history of Hypertension Denies history of Angina, Arrhythmia, Congestive Heart Failure, Coronary Artery Disease, Deep Vein Thrombosis, Hypotension, Myocardial Infarction, Peripheral Arterial Disease, Peripheral Venous Disease, Phlebitis, Vasculitis Gastrointestinal Denies history of Cirrhosis , Colitis, Crohnoos, Hepatitis A, Hepatitis B, Hepatitis C Endocrine Patient has history of Type II Diabetes Genitourinary Denies history of End Stage Renal Disease Immunological Denies history of Lupus Erythematosus, Raynaudoos, Scleroderma Integumentary (Skin) Denies history of History of Burn Musculoskeletal Denies history of Gout, Rheumatoid Arthritis, Osteoarthritis, Osteomyelitis Neurologic Denies history of Dementia, Neuropathy, Quadriplegia, Paraplegia, Seizure Disorder Oncologic Denies history of Received Chemotherapy, Received Radiation Psychiatric Denies history of Anorexia/bulimia, Confinement Anxiety Hospitalization/Surgery History - esophagogastroduodenoscopy. - brain aneurysm surgery. Medical A Surgical History Notes nd Constitutional Symptoms (General Health) obesity Gastrointestinal diverticulitis Neurologic neuropathy Objective Constitutional No acute  distress.. Vitals Time Taken: 8:40 AM, Height: 75 in, Weight: 310 lbs, BMI: 38.7, Temperature: 97.6 F, Pulse: 78 bpm, Respiratory Rate: 18 breaths/min, Blood Pressure: 178/91 mmHg. Respiratory Normal work of breathing on room air.. General Notes: 02/21/2022: The wound is about the same size and has some accumulated slough on the surface. The base is a little bit fibrotic but there are some areas of granulation tissue filling in. Integumentary (Hair, Skin) Wound #2 status is Open. Original cause  of wound was Trauma. The date acquired was: 12/03/2021. The wound has been in treatment 7 weeks. The wound is located on the Right,Lateral Lower Leg. The wound measures 3.6cm length x 2.5cm width x 0.1cm depth; 7.069cm^2 area and 0.707cm^3 volume. There is Fat Layer (Subcutaneous Tissue) exposed. There is no tunneling or undermining noted. There is a medium amount of serosanguineous drainage noted. The wound margin is distinct with the outline attached to the wound base. There is medium (34-66%) red granulation within the wound bed. There is a medium (34-66%) amount of necrotic tissue within the wound bed including Adherent Slough. Assessment Active Problems ICD-10 Non-pressure chronic ulcer of other part of right lower leg with fat layer exposed Type 2 diabetes mellitus with other skin ulcer Type 2 diabetes mellitus without complications Essential (primary) hypertension Venous insufficiency (chronic) (peripheral) Procedures Wound #2 Pre-procedure diagnosis of Wound #2 is a Venous Leg Ulcer located on the Right,Lateral Lower Leg .Severity of Tissue Pre Debridement is: Fat layer exposed. There was a Excisional Skin/Subcutaneous Tissue Debridement with a total area of 9 sq cm performed by Fredirick Maudlin, MD. With the following instrument(s): Curette to remove Viable and Non-Viable tissue/material. Material removed includes Subcutaneous Tissue and Slough and after achieving pain control using  Lidocaine 5% topical ointment. No specimens were taken. A time out was conducted at 09:01, prior to the start of the procedure. A Minimum amount of bleeding was controlled with Pressure. The procedure was tolerated well with a pain level of 8 throughout and a pain level of 2 following the procedure. Post Debridement Measurements: 3.6cm length x 2.5cm width x 0.1cm depth; 0.707cm^3 volume. Character of Wound/Ulcer Post Debridement is improved. Severity of Tissue Post Debridement is: Fat layer exposed. Post procedure Diagnosis Wound #2: Same as Pre-Procedure Plan Follow-up Appointments: Return Appointment in 1 week. - Dr. Celine Ahr - Room 2 - Bathing/ Shower/ Hygiene: May shower with protection but do not get wound dressing(s) wet. - may purchase a cast protector from [place] or [facility] Edema Control - Lymphedema / SCD / Other: Elevate legs to the level of the heart or above for 30 minutes daily and/or when sitting, a frequency of: Avoid standing for long periods of time. Patient to wear own compression stockings every day. Exercise regularly Moisturize legs daily. Compression stocking or Garment 20-30 mm/Hg pressure to: - to L left until R leg is healed WOUND #2: - Lower Leg Wound Laterality: Right, Lateral Cleanser: Soap and Water 1 x Per Week/30 Days Discharge Instructions: May shower and wash wound with dial antibacterial soap and water prior to dressing change. Cleanser: Wound Cleanser 1 x Per Week/30 Days Discharge Instructions: Cleanse the wound with wound cleanser prior to applying a clean dressing using gauze sponges, not tissue or cotton balls. Peri-Wound Care: Sween Lotion (Moisturizing lotion) 1 x Per Week/30 Days Discharge Instructions: Apply moisturizing lotion as directed Prim Dressing: Hydrofera Blue Classic Foam, 4x4 in 1 x Per Week/30 Days ary Discharge Instructions: Moisten with saline prior to applying to wound bed Prim Dressing: Santyl Ointment 1 x Per Week/30  Days ary Discharge Instructions: Apply nickel thick amount to wound bed as instructed Secondary Dressing: Woven Gauze Sponge, Non-Sterile 4x4 in 1 x Per Week/30 Days Discharge Instructions: Apply over primary dressing as directed. Secured With: [employer] Self-Adherent Wrap 4x5 (in/yd) 1 x Per Week/30 Days Discharge Instructions: Secure with [employer] as directed. Secured With: [employer], 4.5x3.1 (in/yd) 1 x Per Week/30 Days Discharge Instructions: Secure with Kerlix as directed. Secured With:  45M Medipore H Soft Cloth Surgical T ape, 4 x 10 (in/yd) 1 x Per Week/30 Days Discharge Instructions: Secure with tape as directed. 02/21/2022: The wound is about the same size and has some accumulated slough on the surface. The base is a little bit fibrotic but there are some areas of granulation tissue filling in. I used a curette to debride the slough from the wound, along with some nonviable subcutaneous tissue. We will continue using the Santyl and Hydrofera Blue for another week. I am hopeful that the wound will be clean enough next week to initiate a dressing that may encourage further granulation tissue formation. I will see him then. Electronic Signature(s) Signed: 02/22/2022 9:14:11 AM By: Duanne Guess MD FACS Previous Signature: 02/21/2022 10:42:58 AM Version By: Duanne Guess MD FACS Entered By: Duanne Guess on 02/22/2022 09:14:10 -------------------------------------------------------------------------------- HxROS Details Patient Name: Date of Service: [name], [name] 02/21/2022 8:30 A M Medical Record Number: [id number] Patient Account Number: [id number] Date of Birth/Sex: Treating RN: 1955-10-19 (66 y.o. [name] Primary Care Provider: Hoy Register Other Clinician: Referring Provider: Treating Provider/Extender: Camillo Flaming Weeks in Treatment: 7 Information Obtained From Patient Constitutional Symptoms (General Health) Medical  History: Past Medical History Notes: obesity Eyes Medical History: Negative for: Cataracts; Glaucoma; Optic Neuritis Ear/Nose/Mouth/Throat Medical History: Negative for: Chronic sinus problems/congestion; Middle ear problems Hematologic/Lymphatic Medical History: Negative for: Anemia; Hemophilia; Human Immunodeficiency Virus; Lymphedema; Sickle Cell Disease Respiratory Medical History: Negative for: Aspiration; Asthma; Chronic Obstructive Pulmonary Disease (COPD); Pneumothorax; Sleep Apnea; Tuberculosis Cardiovascular Medical History: Positive for: Hypertension Negative for: Angina; Arrhythmia; Congestive Heart Failure; Coronary Artery Disease; Deep Vein Thrombosis; Hypotension; Myocardial Infarction; Peripheral Arterial Disease; Peripheral Venous Disease; Phlebitis; Vasculitis Gastrointestinal Medical History: Negative for: Cirrhosis ; Colitis; Crohns; Hepatitis A; Hepatitis B; Hepatitis C Past Medical History Notes: diverticulitis Endocrine Medical History: Positive for: Type II Diabetes Time with diabetes: 8 years Treated with: Oral agents Blood sugar tested every day: No Genitourinary Medical History: Negative for: End Stage Renal Disease Immunological Medical History: Negative for: Lupus Erythematosus; Raynauds; Scleroderma Integumentary (Skin) Medical History: Negative for: History of Burn Musculoskeletal Medical History: Negative for: Gout; Rheumatoid Arthritis; Osteoarthritis; Osteomyelitis Neurologic Medical History: Negative for: Dementia; Neuropathy; Quadriplegia; Paraplegia; Seizure Disorder Past Medical History Notes: neuropathy Oncologic Medical History: Negative for: Received Chemotherapy; Received Radiation Psychiatric Medical History: Negative for: Anorexia/bulimia; Confinement Anxiety Immunizations Pneumococcal Vaccine: Received Pneumococcal Vaccination: No Immunization Notes: tetanus 2 years ago Implantable Devices None Hospitalization /  Surgery History Type of Hospitalization/Surgery esophagogastroduodenoscopy brain aneurysm surgery Family and Social History Cancer: Yes - Mother; Diabetes: Yes - Mother,Father; Heart Disease: No; Hereditary Spherocytosis: No; Hypertension: Yes - Mother,Father; Kidney Disease: Yes - Siblings; Lung Disease: No; Seizures: No; Stroke: Yes - Father; Thyroid Problems: No; Tuberculosis: No; Former smoker - ended on 08/14/1990; Marital Status - Married; Alcohol Use: Moderate; Drug Use: No History; Caffeine Use: Daily; Financial Concerns: No; Food, Clothing or Shelter Needs: No; Support System Lacking: No; Transportation Concerns: No Electronic Signature(s) Signed: 02/21/2022 4:27:02 PM By: Duanne Guess MD FACS Signed: 02/21/2022 4:46:37 PM By: Gelene Mink By: Duanne Guess on 02/21/2022 10:41:39 -------------------------------------------------------------------------------- SuperBill Details Patient Name: Date of Service: [name], [name] 02/21/2022 Medical Record Number: [id number] Patient Account Number: [id number] Date of Birth/Sex: Treating RN: 1956-08-08 (66 y.o. 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.622 Type 2 diabetes mellitus with other skin ulcer E11.9 Type 2 diabetes mellitus without complications [id number] Essential (primary) hypertension I87.2 Venous insufficiency (chronic) (peripheral) Facility Procedures CPT4 Code: [id number] Description: [id number] - DEB SUBQ TISSUE 20 SQ CM/< ICD-10 Diagnosis Description [id number] Non-pressure chronic ulcer of other part of right lower leg with fat layer exp Modifier: osed Quantity: 1 Physician Procedures : CPT4 Code Description Modifier [id number]  [id number] - WC PHYS LEVEL 3 - EST PT 25 ICD-10 Diagnosis Description [id number] 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

## 2022-02-22 ENCOUNTER — Encounter (HOSPITAL_BASED_OUTPATIENT_CLINIC_OR_DEPARTMENT_OTHER): Payer: Medicare Other

## 2022-02-27 ENCOUNTER — Encounter (HOSPITAL_BASED_OUTPATIENT_CLINIC_OR_DEPARTMENT_OTHER): Payer: Medicare Other

## 2022-02-28 ENCOUNTER — Encounter (HOSPITAL_BASED_OUTPATIENT_CLINIC_OR_DEPARTMENT_OTHER): Payer: Medicare Other | Admitting: General Surgery

## 2022-02-28 DIAGNOSIS — I10 Essential (primary) hypertension: Secondary | ICD-10-CM | POA: Diagnosis not present

## 2022-02-28 DIAGNOSIS — I872 Venous insufficiency (chronic) (peripheral): Secondary | ICD-10-CM | POA: Diagnosis not present

## 2022-02-28 DIAGNOSIS — E11622 Type 2 diabetes mellitus with other skin ulcer: Secondary | ICD-10-CM | POA: Diagnosis not present

## 2022-02-28 DIAGNOSIS — L97812 Non-pressure chronic ulcer of other part of right lower leg with fat layer exposed: Secondary | ICD-10-CM | POA: Diagnosis not present

## 2022-02-28 DIAGNOSIS — E119 Type 2 diabetes mellitus without complications: Secondary | ICD-10-CM | POA: Diagnosis not present

## 2022-02-28 NOTE — Progress Notes (Signed)
Anthony Anthony, Anthony Anthony ([id number]) Visit Report for 02/28/2022 Chief Complaint Document Details Patient Name: Date of Service: Anthony Anthony [name] 02/28/2022 10:00 A M Medical Record Number: [id number] Patient Account Number: [id number] Date of Birth/Sex: Treating RN: 1956/01/20 (66 y.o. Anthony Anthony Primary Care Provider: Hoy Register Other Clinician: Referring Provider: Treating Provider/Extender: Camillo Flaming Weeks in Treatment: 8 Information Obtained from: Patient Chief Complaint Right LE Ulcer Electronic Signature(s) Signed: 02/28/2022 10:31:55 AM By: Duanne Guess MD FACS Entered By: Duanne Guess on 02/28/2022 10:31:55 -------------------------------------------------------------------------------- Debridement Details Patient Name: Date of Service: Anthony Anthony, [name] 02/28/2022 10:00 A M Medical Record Number: [id number] Patient Account Number: [id number] Date of Birth/Sex: Treating RN: 24-Feb-1956 (66 y.o. Anthony Anthony Primary Care Provider: Hoy Register Other Clinician: Referring Provider: Treating Provider/Extender: Camillo Flaming Weeks in Treatment: 8 Debridement Performed for Assessment: Wound #2 Right,Lateral Lower Leg Performed By: Physician Duanne Guess, MD Debridement Type: Debridement Severity of Tissue Pre Debridement: Fat layer exposed Level of Consciousness (Pre-procedure): Awake and Alert Pre-procedure Verification/Time Out Yes - 10:22 Taken: Start Time: 10:22 Pain Control: Lidocaine 5% topical ointment T Area Debrided (L x W): otal 4.5 (cm) x 1.6 (cm) = 7.2 (cm) Tissue and other material debrided: Viable, Non-Viable, Slough, Subcutaneous, Slough Level: Skin/Subcutaneous Tissue Debridement Description: Excisional Instrument: Curette Bleeding: Minimum Hemostasis Achieved: Pressure Procedural Pain: 10 Post Procedural Pain: 2 Response to Treatment: Procedure was tolerated well Level  of Consciousness (Post- Awake and Alert procedure): Post Debridement Measurements of Total Wound Length: (cm) 4.5 Width: (cm) 1.6 Depth: (cm) 0.1 Volume: (cm) 0.565 Character of Wound/Ulcer Post Debridement: Improved Severity of Tissue Post Debridement: Fat layer exposed Post Procedure Diagnosis Same as Pre-procedure Electronic Signature(s) Signed: 02/28/2022 11:38:11 AM By: Duanne Guess MD FACS Signed: 02/28/2022 5:25:46 PM By: Samuella Bruin Entered By: Samuella Bruin on 02/28/2022 10:23:44 -------------------------------------------------------------------------------- HPI Details Patient Name: Date of Service: Anthony Anthony, [name] 02/28/2022 10:00 A M Medical Record Number: [id number] Patient Account Number: [id number] Date of Birth/Sex: Treating RN: 1955-12-16 (66 y.o. Anthony Anthony Primary Care Provider: Hoy Register Other Clinician: Referring Provider: Treating Provider/Extender: Camillo Flaming Weeks in Treatment: 8 History of Present Illness HPI Description: ADMISSION 03/12/18 This is a 66 year old and Barnes was a type II diabetic reasonably poorly controlled with a recent hemoglobin A1c of 8.2. He was tending to his lawn on 01/15/18 when his weed Anthony Anthony sent a rock up word hitting him in the right anterior leg. He was seen in his primary M.D. office is same time. An x-ray showed no abnormality. He was given a course of cephalexin. Since then he's been applying peroxide and topical antibiotics to the wound. He does not have a history of chronic wounds however he does have chronic edema in the lower legs. He is complaining of pain in the right leg wound but no real history of claudication. Past medical history includes type 2 diabetes, hypertension, morbid obesity. ABIs in the right leg were noncompressible 03/19/18 on evaluation today patient appears to be tolerating the wrap in general fairly well. He states he's not having that much pain in  regard to the wound itself. With that being said he is having some issues with slough buildup on the surface of the wound the Iodoflex does seem to be helpful in that regard. He is still having discomfort he wonders if I can send in a prescription for ibuprofen or something of like to help him out. I do believe that  the prox end may be of benefit for him. 03/25/18 on evaluation today patient appears to be doing very well in regard to his right lateral lower Anthony Anthony extremity ulcer. He's been tolerating the dressing changes without complication that is the wraps. Nonetheless he does continue to have pain he states is been dreading the possibility of having to have debridement again today. His first debridement experience was not optimal. With that being said he does seem to be showing signs of improvement there does appear to be little bit more granulation he does have a lot of slough that really does need to breeding away. 04/04/18;the patient went for arterial studies that were really quite normal. ABI on the right at 1.19 with triphasic waveforms on the left 1.11 with triphasic waveforms. TBI 0.93 on the right and 0.95 on the left. This suggests T should be able to tolerate even 4 layer compression. He is however complaining of a lot of pain with our wraps I'm wondering whether the pain is simply Iodoflex. I changed him to Anthony Anthony. I'm still going to try to keep him in 3 layer compression 04/12/18 on evaluation today patient actually appears to be doing rather well in regard to his right lower Anthony ulcer. He is making good progress although Barnes is slow still I do believe that the Santyl is much better than the Iodoflex. The good news is the patient seems to be tolerating the dressing changes without complication now that we've gotten good of the Iodoflex which really did burn him quite significantly. Otherwise there's no evidence of infection. 04/17/18 on evaluation today  patient actually appears to be showing signs of improvement in regard to the ulcer. Unfortunately he's had somewhat of a rough day due to the fact that he found out that one of his friends suddenly and unexpectedly passed today. He states he's not sure he's in the frame of mind to allow me to debride the wound at this point. Nonetheless I do feel like he is showing signs of the wound getting better little by little each week. 04/24/18 on evaluation today patient's ulcer actually appears to be showing some signs of improvement albeit slow. He has been tolerating the dressing changes without complication except for the wrap which she states was so tight that he had to remove Barnes Barnes was causing a lot of discomfort. Fortunately there is no evidence of infection at this point. He states he only wore the wrap until about the time he got home last week and that he had to take Barnes off. Nonetheless he does not have any other compression stockings, Juxta-Lite wraps, or anything otherwise to really help at this point as far as compression is concerned. 05/01/18 on evaluation today patient actually appears to be doing fairly well in regard to his lower extremity ulcer. He has been tolerating the dressing changes currently without complication. The wrap does seem to be helping as far as the fluid management is concerned. Wound bed itself actually show signs of good improvement although there is some Slough noted there's not as much as previous and he actually has fairly decent granulation noted as well. Overall I'm pleased with the progress of to this point. The patient would prefer not to have any debridement today he states he's actually not feeling too well in general and he really does not want any additional discomfort typically debridement is fairly uncomfortable for him. 05/08/18 on evaluation today patient actually appears to be doing a  little better in regard to his lower extremity ulcer. He has been tolerating  the dressing changes without complication. With that being said I'm actually quite pleased with the fact the wound is not appear to be infected and in general has made a little progress. With that being said I do believe we need to perform some debridement to clean away the necrotic tissue on the surface of the wound. 05/15/18 on evaluation today patient actually appears to be doing a little better in regard to his wound. Fortunately there is some improvement noted fortunately there's also no significant evidence of infection at this point in time. He does have continued pain that really nothing different than previous he did get his Juxta-Lite wrap. 05/29/18 on evaluation today patient actually appears to be doing somewhat better in regard to his wound currently. He's been tolerating the dressing changes without complication. With that being said he has been performing the [facility] Dressing changes every day at home. I'm pretty sure that we told him every other day last week or when I saw him rather two weeks ago. With that being said obviously did not hurt to do Barnes daily just that obviously is gonna run out of supplies sooner and that could get him into trouble as far as his insurance is concerned. Nonetheless he at this point still continues to have pain although honestly I don't feel like Barnes's as bad as what Barnes was in the past just based on his reactions at this point. 06/12/18 on evaluation today patient actually appears to be doing better in regard to his lower Anthony ulcer. He's been tolerating the dressing changes without complication. Fortunately there does not appear to be any evidence of infection at this time. No fevers, chills, nausea, or vomiting noted at this time. 06/19/18 evaluation today patient's ulcer on the lower extremity appears to be doing better at this point. he has been tolerating the dressing changes. The patient seems to be somewhat depressed about the progress of his  wound although the overall appearance at this time seems to be doing well. In general I'm very pleased with the appearance today he does have some biofilm on the surface of the wound as well as of hyper granular tissue we may want to utilize a little bit of silver nitrate today. 06/27/2018; patient comes into clinic today for a wound on the right lateral calf likely related to chronic venous insufficiency. He has been using medihoney covered with Hydrofera Blue. Wound surface actually looks quite good 07/03/2018 seen today for follow-up and management of lower extremity ulcer right lateral shin. T olerating dressing changes. He has a small amount of biofilm today. Will attempt to remove a portion of the biofilm as he tolerates. He becomes very anxious with wound treatments. He would benefit from layer compression wraps however he refuses compression wraps or anything that smokes his legs. He expressed an inability to tolerate compression wraps. Juxta lite wraps have been ordered. He has been instructed on the appropriate application of the juxta leg wraps. His blood pressure today on visit was 200/120. He denies any blurred vision, chest pain, dizziness, shortness of breath, or difficulty with mobility. Mr. Sulewski stated that he had a recent visit with his primary care provider. At that time his carvedilol was decreased from 25 mg daily to 12.5 mg daily. Strongly encouraged Mr. Douyon to seek medical attention today during visit. He declined transport for evaluation of elevated blood pressure. Encouraged him to contact his  primary care provider today for medication management. He stated that he would call his primary care doctor after wound visit today. Also encouraged him that if he experienced any symptoms of blurred vision, chest pain, shortness of breath to immediately seek medical attention. 07/17/18 on evaluation today patient's wound actually does seem to show signs of improvement based on the  overall appearance of the wound bed today. There does not appear to show any signs of infection which is good news. There is no overall worsening which is also good news. He still has hyper granular tissue will use in the [employer] followed by [facility] Dressing this point. 07/31/18 on evaluation today patient's wound bed actually show signs of improvement with good epithelialization especially in the upper portion of the wound. He has been tolerating the dressing changes without complication in general. Overall I'm extremely happy with how things stand. No fevers, chills, nausea, or vomiting noted at this time. 08/28/18 on evaluation today patient appears to be doing a little better in regard to his lower extremity ulcer. With that being said Barnes appears to be very hyper granular I think this is directly attributed to the fact that he continues to use the [employer] underneath the [facility] Dressing. Although this helps not to stick unfortunately also think he's not getting the benefit of the [facility] Dressing particular and subsequently is causing too much moisture buildup hence the hyper granulation. Fortunately there does not appear to be any signs of infection at this time which is good news. 09/03/18 on evaluation today patient appears to be doing well in regard to his lower Anthony ulcer. He has been tolerating the dressing changes without complication. Fortunately he has much less hyper granular tissue the noted last week I do think using silver nitrate in changing to using the [facility] Dressing without the mepitel has made a big difference for him this is good news. 09/18/18 on evaluation today patient appears to be doing excellent in regard to his right lower Anthony ulcer. This is actually significantly smaller even compared to the last evaluation. Overall I'm very pleased in this regard. I'm a recommend that we likely repeat the silver nitrate today based on what I'm  seeing. 10/02/18 on evaluation today patient appears to be doing excellent in regard to his lower extremity wound on the right. He's been tolerating the dressing changes without complication. Fortunately there's no signs of infection at this time. Overall been very pleased with how things seem to be progressing. No fevers, chills, nausea, or vomiting noted at this time. 10/16/18 on evaluation today patient actually appears to be doing much better in regard to his lower extremity wound on the right. This is shown signs of good improvement and is indeed measuring smaller he is on a excellent track as far as healing is concerned. My hope is this will be healed of the next several weeks. Fortunately there is no evidence of infection at this time. He does seem to be doing everything that I'm recommending for him 10/30/18 on evaluation today patient appears to be doing better in regard to his lower extremity ulcer. He's been tolerating the dressing changes without complication. Fortunately the Hydrofera Blue Dressing to be doing the job. He has made excellent progress. With that being said this is still going somewhat slow but nonetheless is always making good progress at this point. 5/18-Patient was in to be seen for right leg ulcer that appeared after scab above Barnes was removed today. This  area had healed completely. 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 The wound has not healed though Barnes has been nearly 2 months since the injury occurred. He was seen in the emergency department at [facility] on May 9. At that time Barnes was very painful and he described Barnes as a "15 on a scale of 010". He also was found to have 3+ pitting edema to the  bilateral lower extremities. He was prescribed a weeks course of Keflex. He is here today because the wound has failed to heal and he has a prior history in our clinic. ABI in clinic today was noncompressible. He did have formal vascular studies performed in 2019 which were normal. The last hemoglobin A1c I have available for review was from March 21, 2021 and was elevated at 7.7%. The wound is fairly small and circular located about 3 inches above his right lateral malleolus. Barnes is completely covered with eschar. No surrounding erythema, no odor, no purulent drainage. 01/11/2022: The wound on his right lateral leg is a little bit smaller today. Barnes has reaccumulated some slough and a small bit of eschar. Barnes remains fairly painful. He has another area on his anterior tibia that he will not let me touch but I am concerned that there is a wound forming underneath the surface. 01/18/2022: Unfortunately, his wound is a little bit bigger today. He continues to accumulate slough on the wound surface. Barnes is still quite tender. 01/25/2022: The patient bumped his wound on the car door which resulted in bleeding. He was seen at urgent care where Barnes was redressed. Unfortunately, the wound is bigger again today. There is accumulated slough on the surface. Urgent care gave him some tramadol, apparently and he took this prior to his visit so he could tolerate debridement better. 02/02/2022: The wound is a little bit smaller today. The surface, however, has accumulated a fairly thick layer of slough. The periwound skin is intact and there is no obvious sign of infection. 02/07/2022: The wound is a bit larger today. Barnes has thick slough accumulation. The periwound skin is intact and there is no obvious erythema, induration, nor any odor. 02/16/2022: I took a culture last week due to the appearance of the wound and the patient's degree of pain. This grew out methicillin sensitive Staph aureus. Augmentin was prescribed. The  patient states that the pain is markedly improved today. The wound also looks much better. Barnes is smaller with less slough buildup and there is good granulation tissue emerging. 02/21/2022: The patient's pain is quite improved from prior visits. The wound is about the same size and has some accumulated slough on the surface. The base is a little bit fibrotic but there are some areas of granulation tissue filling in. 02/28/2022: The wound is a little bit narrower on its measurements but slightly longer. There is still some slough accumulation on the surface, but he has minimal pain and there is no concern for ongoing infection. Granulation tissue is emerging. Electronic Signature(s) Signed: 02/28/2022 10:32:47 AM By: Duanne Guess MD FACS Entered By: Duanne Guess on 02/28/2022 10:32:47 -------------------------------------------------------------------------------- Physical Exam Details Patient Name: Date of Service: Anthony Anthony, [name] 02/28/2022 10:00 A M Medical Record Number:  [id number] Patient Account Number: [id number] Date of Birth/Sex: Treating RN: [birth year] (66 y.o. Anthony Anthony Primary Care Provider: Hoy Register Other Clinician: Referring Provider: Treating Provider/Extender: Camillo Flaming Weeks in Treatment: 8 Constitutional Hypertensive, asymptomatic. . . . No acute distress.Marland Kitchen Respiratory Normal work of breathing on room air.. Notes 02/28/2022: The wound is a little bit narrower on its measurements but slightly longer. There is still some slough accumulation on the surface, but he has minimal pain and there is no concern for ongoing infection. Granulation tissue is emerging. Electronic Signature(s) Signed: 02/28/2022 10:33:28 AM By: Duanne Guess MD FACS Entered By: Duanne Guess on 02/28/2022 10:33:27 -------------------------------------------------------------------------------- Physician Orders Details Patient Name: Date of  Service: Anthony Anthony, [name] 02/28/2022 10:00 A M Medical Record Number: [id number] Patient Account Number: [id number] Date of Birth/Sex: Treating RN: [birth year] (66 y.o. Anthony Anthony Primary Care Provider: Hoy Register Other Clinician: Referring Provider: Treating Provider/Extender: Camillo Flaming Weeks in Treatment: 8 Verbal / Phone Orders: No Diagnosis Coding Follow-up Appointments ppointment in 1 week. - Dr. Lady Gary - Room 2 - 7/25 at 8:30 AM Return A Bathing/ Shower/ Hygiene May shower with protection but do not get wound dressing(s) wet. - may purchase a cast protector from [facility] or [facility] Edema Control - Lymphedema / SCD / Other Elevate legs to the level of the heart or above for 30 minutes daily and/or when sitting, a frequency of: Avoid standing for long periods of time. Patient to wear own compression stockings every day. Exercise regularly Moisturize legs daily. Compression stocking or Garment 20-30 mm/Hg pressure to: - to L left until R leg is healed Wound Treatment Wound #2 - Lower Leg Wound Laterality: Right, Lateral Cleanser: Soap and Water 1 x Per Week/30 Days Discharge Instructions: May shower and wash wound with dial antibacterial soap and water prior to dressing change. Cleanser: Wound Cleanser 1 x Per Week/30 Days Discharge Instructions: Cleanse the wound with wound cleanser prior to applying a clean dressing using gauze sponges, not tissue or cotton balls. Peri-Wound Care: Sween Lotion (Moisturizing lotion) 1 x Per Week/30 Days Discharge Instructions: Apply moisturizing lotion as directed Prim Dressing: Hydrofera Blue Classic Foam, 4x4 in 1 x Per Week/30 Days ary Discharge Instructions: Moisten with saline prior to applying to wound bed Prim Dressing: Santyl Ointment 1 x Per Week/30 Days ary Discharge Instructions: Apply nickel thick amount to wound bed as instructed Secondary Dressing: Woven Gauze Sponge, Non-Sterile 4x4 in 1 x  Per Week/30 Days Discharge Instructions: Apply over primary dressing as directed. Secured With: Coban Self-Adherent Wrap 4x5 (in/yd) 1 x Per Week/30 Days Discharge Instructions: Secure with Coban as directed. Secured With: [employer], 4.5x3.1 (in/yd) 1 x Per Week/30 Days Discharge Instructions: Secure with Kerlix as directed. Secured With: 75M Medipore H Soft Cloth Surgical T ape, 4 x 10 (in/yd) 1 x Per Week/30 Days Discharge Instructions: Secure with tape as directed. Electronic Signature(s) Signed: 02/28/2022 11:38:11 AM By: Duanne Guess MD FACS Signed: 02/28/2022 5:25:46 PM By: Samuella Bruin Previous Signature: 02/28/2022 10:33:36 AM Version By: Duanne Guess MD FACS Entered By: Samuella Bruin on 02/28/2022 10:35:48 -------------------------------------------------------------------------------- Problem List Details Patient Name: Date of Service: Anthony Anthony, [name] 02/28/2022 10:00 A M Medical Record Number: [id number] Patient Account Number: [id number] Date of Birth/Sex: Treating RN: 1956-05-14 (66 y.o. Anthony Anthony Primary Care Provider: Hoy Register Other Clinician: Referring Provider: Treating Provider/Extender: Camillo Flaming Weeks in Treatment: 8 Active Problems ICD-10 Encounter Code Description Active Date  MDM Diagnosis L97.812 Non-pressure chronic ulcer of other part of right lower leg with fat layer 01/02/2022 No Yes exposed E11.622 Type 2 diabetes mellitus with other skin ulcer 01/02/2022 No Yes E11.9 Type 2 diabetes mellitus without complications 01/02/2022 No Yes I10 Essential (primary) hypertension 01/02/2022 No Yes I87.2 Venous insufficiency (chronic) (peripheral) 01/02/2022 No Yes Inactive Problems Resolved Problems Electronic Signature(s) Signed: 02/28/2022 10:27:15 AM By: Duanne Guess MD FACS Entered By: Duanne Guess on 02/28/2022  10:27:15 -------------------------------------------------------------------------------- Progress Note Details Patient Name: Date of Service: Anthony Anthony, [name] 02/28/2022 10:00 A M Medical Record Number: [id number] Patient Account Number: [id number] Date of Birth/Sex: Treating RN: 1955/11/28 (66 y.o. Anthony Anthony Primary Care Provider: Hoy Register Other Clinician: Referring Provider: Treating Provider/Extender: Camillo Flaming Weeks in Treatment: 8 Subjective Chief Complaint Information obtained from Patient Right LE Ulcer History of Present Illness (HPI) ADMISSION 03/12/18 This is a 66 year old and Barnes was a type II diabetic reasonably poorly controlled with a recent hemoglobin A1c of 8.2. He was tending to his lawn on 01/15/18 when his weed Anthony Anthony sent a rock up word hitting him in the right anterior leg. He was seen in his primary M.D. office is same time. An x-ray showed no abnormality. He was given a course of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 skin ulcer E11.9 Type 2 diabetes mellitus without complications I10  Essential (primary) hypertension I87.2 Venous insufficiency (chronic) (peripheral) Facility Procedures CPT4 Code: [id number] 1 Description: 1042 - DEB SUBQ TISSUE 20 SQ CM/< ICD-10 Diagnosis Description 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 Modifier: osed Quantity: 1 Physician Procedures : CPT4 Code Description Modifier [id number] - WC PHYS LEVEL 4 - EST PT 25 ICD-10 Diagnosis Description 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osed E11.622 Type 2 diabetes mellitus with other skin ulcer  I87.2 Venous insufficiency (chronic) (peripheral) I10 Essential (primary) hypertension Quantity: 1 : [id number] 11042 - WC PHYS SUBQ TISS 20 SQ CM 1 ICD-10 Diagnosis Description 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osed Quantity: Electronic Signature(s) Signed: 02/28/2022 10:34:45 AM By: Duanne Guess MD FACS Entered By: Duanne Guess on 02/28/2022 10:34:45

## 2022-02-28 NOTE — Progress Notes (Signed)
Anthony Barnes, Anthony Barnes ([id number]) Visit Report for 02/28/2022 Arrival Information Details Patient Name: Date of Service: Anthony Barnes [name] 02/28/2022 10:00 A M Medical Record Number: [id number] Patient Account Number: [id number] Date of Birth/Sex: Treating RN: [birth year] (66 y.o. Anthony Barnes Primary Care Pia Jedlicka: Charlott Rakes Other Clinician: Referring Karlynn Furrow: Treating Yexalen Deike/Extender: Carter Kitten Weeks in Treatment: 8 Visit Information History Since Last Visit Added or deleted any medications: No Patient Arrived: Ambulatory Any new allergies or adverse reactions: No Arrival Time: 10:06 Had a fall or experienced change in No Accompanied By: self activities of daily living that may affect Transfer Assistance: None risk of falls: Patient Identification Verified: Yes Signs or symptoms of abuse/neglect since last visito No Secondary Verification Process Completed: Yes Hospitalized since last visit: No Patient Requires Transmission-Based Precautions: No Implantable device outside of the clinic excluding No cellular tissue based products placed in the center since last visit: Has Dressing in Place as Prescribed: Yes Has Compression in Place as Prescribed: Yes Pain Present Now: No Electronic Signature(s) Signed: 02/28/2022 5:25:46 PM By: Adline Peals Entered By: Adline Peals on 02/28/2022 10:09:26 -------------------------------------------------------------------------------- Encounter Discharge Information Details Patient Name: Date of Service: Anthony Barnes, Anthony [name] 02/28/2022 10:00 [place] Record Number: [id number] Patient Account Number: [id number] Date of Birth/Sex: Treating RN: 05/21/1956 (66 y.o. Anthony Barnes Primary Care Kaelah Hayashi: Charlott Rakes Other Clinician: Referring Jaquell Seddon: Treating Mantaj Chamberlin/Extender: Carter Kitten Weeks in Treatment: 8 Encounter Discharge Information Items Post  Procedure Vitals Discharge Condition: Stable Temperature (F): 98 Ambulatory Status: Ambulatory Pulse (bpm): 73 Discharge Destination: Home Respiratory Rate (breaths/min): 18 Transportation: Private Auto Blood Pressure (mmHg): 157/97 Accompanied By: self Schedule Follow-up Appointment: Yes Clinical Summary of Care: Patient Declined Electronic Signature(s) Signed: 02/28/2022 5:25:46 PM By: Adline Peals Entered By: Adline Peals on 02/28/2022 12:45:42 -------------------------------------------------------------------------------- Lower Extremity Assessment Details Patient Name: Date of Service: Anthony Barnes [name] 02/28/2022 10:00 A M Medical Record Number: [id number] Patient Account Number: [id number] Date of Birth/Sex: Treating RN: 03-Sep-1955 (67 y.o. Anthony Barnes Primary Care Latha Staunton: Charlott Rakes Other Clinician: Referring Jaeveon Ashland: Treating Maylin Freeburg/Extender: Carter Kitten Weeks in Treatment: 8 Edema Assessment Assessed: [Left: No] [Right: No] E[Left: dema] [Right: :] Calf Left: Right: Point of Measurement: From Medial Instep 43 cm Ankle Left: Right: Point of Measurement: From Medial Instep 27.2 cm Vascular Assessment Pulses: Dorsalis Pedis Palpable: [Right:Yes] Electronic Signature(s) Signed: 02/28/2022 5:25:46 PM By: Adline Peals Entered By: Adline Peals on 02/28/2022 10:15:58 -------------------------------------------------------------------------------- Multi Wound Chart Details Patient Name: Date of Service: Anthony Barnes, Anthony [name] 02/28/2022 10:00 A M Medical Record Number: [id number] Patient Account Number: [id number] Date of Birth/Sex: Treating RN: March 11, 1956 (66 y.o. Anthony Barnes Primary Care Ifrah Vest: Charlott Rakes Other Clinician: Referring Varian Innes: Treating Arzu Mcgaughey/Extender: Carter Kitten Weeks in Treatment: 8 Vital Signs Height(in): 75 Pulse(bpm):  73 Weight(lbs): 310 Blood Pressure(mmHg): 157/97 Body Mass Index(BMI): 38.7 Temperature(F): 98 Respiratory Rate(breaths/min): 18 Photos: [N/A:N/A] Right, Lateral Lower Leg N/A N/A Wound Location: Trauma N/A N/A Wounding Event: Venous Leg Ulcer N/A N/A Primary Etiology: Hypertension, Type II Diabetes N/A N/A Comorbid History: 12/03/2021 N/A N/A Date Acquired: 8 N/A N/A Weeks of Treatment: Open N/A N/A Wound Status: No N/A N/A Wound Recurrence: 4.5x1.6x0.1 N/A N/A Measurements L x W x D (cm) 5.655 N/A N/A A (cm) : rea 0.565 N/A N/A Volume (cm) : -433.50% N/A N/A % Reduction in A rea: -433.00% N/A N/A % Reduction in Volume: Full Thickness Without Exposed N/A N/A Classification:  Support Structures Medium N/A N/A Exudate A mount: Serosanguineous N/A N/A Exudate Type: red, brown N/A N/A Exudate Color: Distinct, outline attached N/A N/A Wound Margin: Medium (34-66%) N/A N/A Granulation A mount: Red N/A N/A Granulation Quality: Medium (34-66%) N/A N/A Necrotic A mount: Fat Layer (Subcutaneous Tissue): Yes N/A N/A Exposed Structures: Fascia: No Tendon: No Muscle: No Joint: No Bone: No Small (1-33%) N/A N/A Epithelialization: Debridement - Excisional N/A N/A Debridement: Pre-procedure Verification/Time Out 10:22 N/A N/A Taken: Lidocaine 5% topical ointment N/A N/A Pain Control: Subcutaneous, Slough N/A N/A Tissue Debrided: Skin/Subcutaneous Tissue N/A N/A Level: 7.2 N/A N/A Debridement A (sq cm): rea Curette N/A N/A Instrument: Minimum N/A N/A Bleeding: Pressure N/A N/A Hemostasis A chieved: 10 N/A N/A Procedural Pain: 2 N/A N/A Post Procedural Pain: Procedure was tolerated well N/A N/A Debridement Treatment Response: 4.5x1.6x0.1 N/A N/A Post Debridement Measurements L x W x D (cm) 0.565 N/A N/A Post Debridement Volume: (cm) Debridement N/A N/A Procedures Performed: Treatment Notes Electronic Signature(s) Signed: 02/28/2022  10:31:50 AM By: Fredirick Maudlin MD FACS Signed: 02/28/2022 5:25:46 PM By: Adline Peals Entered By: Fredirick Maudlin on 02/28/2022 10:31:50 -------------------------------------------------------------------------------- Multi-Disciplinary Care Plan Details Patient Name: Date of Service: Anthony Barnes, Anthony [name] 02/28/2022 10:00 A M Medical Record Number: [id number] Patient Account Number: [id number] Date of Birth/Sex: Treating RN: 10/04/1955 (66 y.o. Anthony Barnes Primary Care Letoya Stallone: Charlott Rakes Other Clinician: Referring Larrell Rapozo: Treating Shonita Rinck/Extender: Carter Kitten Weeks in Treatment: 8 Active Inactive Venous Leg Ulcer Nursing Diagnoses: Actual venous Insuffiency (use after diagnosis is confirmed) Knowledge deficit related to disease process and management Goals: Patient will maintain optimal edema control Date Initiated: 01/02/2022 Target Resolution Date: 03/09/2022 Goal Status: Active Patient/caregiver will verbalize understanding of disease process and disease management Date Initiated: 01/02/2022 Target Resolution Date: 03/09/2022 Goal Status: Active Interventions: Assess peripheral edema status every visit. Compression as ordered Provide education on venous insufficiency Treatment Activities: Non-invasive vascular studies : 01/02/2022 T ordered outside of clinic : 01/02/2022 est Therapeutic compression applied : 01/02/2022 Notes: Wound/Skin Impairment Nursing Diagnoses: Impaired tissue integrity Knowledge deficit related to ulceration/compromised skin integrity Goals: Patient/caregiver will verbalize understanding of skin care regimen Date Initiated: 01/02/2022 Date Inactivated: 02/02/2022 Target Resolution Date: 02/03/2022 Goal Status: Met Ulcer/skin breakdown will have a volume reduction of 30% by week 4 Date Initiated: 01/02/2022 Target Resolution Date: 03/09/2022 Goal Status: Active Interventions: Assess ulceration(s)  every visit Provide education on ulcer and skin care Treatment Activities: Skin care regimen initiated : 01/02/2022 Topical wound management initiated : 01/02/2022 Notes: Electronic Signature(s) Signed: 02/28/2022 5:25:46 PM By: Adline Peals Entered By: Adline Peals on 02/28/2022 10:20:26 -------------------------------------------------------------------------------- Pain Assessment Details Patient Name: Date of Service: Anthony Barnes, Anthony [name] 02/28/2022 10:00 [place] Record Number: [id number] Patient Account Number: [id number] Date of Birth/Sex: Treating RN: 10-09-1955 (66 y.o. Anthony Barnes Primary Care Katherleen Folkes: Charlott Rakes Other Clinician: Referring Quentin Strebel: Treating Daman Steffenhagen/Extender: Carter Kitten Weeks in Treatment: 8 Active Problems Location of Pain Severity and Description of Pain Patient Has Paino No Site Locations Rate the pain. Rate the pain. Current Pain Level: 0 Pain Management and Medication Current Pain Management: Electronic Signature(s) Signed: 02/28/2022 5:25:46 PM By: Adline Peals Entered By: Adline Peals on 02/28/2022 10:15:13 -------------------------------------------------------------------------------- Patient/Caregiver Education Details Patient Name: Date of Service: Anthony Barnes, [place]. 7/18/2023andnbsp10:00 A M Medical Record Number: [id number] Patient Account Number: [id number] Date of Birth/Gender: Treating RN: 22-Nov-1955 (66 y.o. Anthony Barnes Primary Care Physician: Charlott Rakes Other Clinician: Referring Physician:  Treating Physician/Extender: Carter Kitten Weeks in Treatment: 8 Education Assessment Education Provided To: Patient Education Topics Provided Wound/Skin Impairment: Methods: Explain/Verbal Responses: Reinforcements needed, State content correctly Electronic Signature(s) Signed: 02/28/2022 5:25:46 PM By: Adline Peals Entered By:  Adline Peals on 02/28/2022 10:20:35 -------------------------------------------------------------------------------- Wound Assessment Details Patient Name: Date of Service: Anthony Barnes, Anthony [name] 02/28/2022 10:00 A M Medical Record Number: [id number] Patient Account Number: [id number] Date of Birth/Sex: Treating RN: [birth year] (66 y.o. Anthony Barnes Primary Care Macyn Shropshire: Charlott Rakes Other Clinician: Referring Renzo Vincelette: Treating Leith Szafranski/Extender: Carter Kitten Weeks in Treatment: 8 Wound Status Wound Number: 2 Primary Etiology: Venous Leg Ulcer Wound Location: Right, Lateral Lower Leg Wound Status: Open Wounding Event: Trauma Comorbid History: Hypertension, Type II Diabetes Date Acquired: 12/03/2021 Weeks Of Treatment: 8 Clustered Wound: No Photos Wound Measurements Length: (cm) 4.5 Width: (cm) 1.6 Depth: (cm) 0.1 Area: (cm) 5.655 Volume: (cm) 0.565 % Reduction in Area: -433.5% % Reduction in Volume: -433% Epithelialization: Small (1-33%) Tunneling: No Undermining: No Wound Description Classification: Full Thickness Without Exposed Support Structures Wound Margin: Distinct, outline attached Exudate Amount: Medium Exudate Type: Serosanguineous Exudate Color: red, brown Foul Odor After Cleansing: No Slough/Fibrino Yes Wound Bed Granulation Amount: Medium (34-66%) Exposed Structure Granulation Quality: Red Fascia Exposed: No Necrotic Amount: Medium (34-66%) Fat Layer (Subcutaneous Tissue) Exposed: Yes Necrotic Quality: Adherent Slough Tendon Exposed: No Muscle Exposed: No Joint Exposed: No Bone Exposed: No Treatment Notes Wound #2 (Lower Leg) Wound Laterality: Right, Lateral Cleanser Soap and Water Discharge Instruction: May shower and wash wound with dial antibacterial soap and water prior to dressing change. Wound Cleanser Discharge Instruction: Cleanse the wound with wound cleanser prior to applying a clean dressing  using gauze sponges, not tissue or cotton balls. Peri-Wound Care Sween Lotion (Moisturizing lotion) Discharge Instruction: Apply moisturizing lotion as directed Topical Primary Dressing Hydrofera Blue Classic Foam, 4x4 in Discharge Instruction: Moisten with saline prior to applying to wound bed Santyl Ointment Discharge Instruction: Apply nickel thick amount to wound bed as instructed Secondary Dressing Woven Gauze Sponge, Non-Sterile 4x4 in Discharge Instruction: Apply over primary dressing as directed. Secured With [employer] 4x5 (in/yd) Discharge Instruction: Secure with Coban as directed. Kerlix Roll Sterile, 4.5x3.1 (in/yd) Discharge Instruction: Secure with Kerlix as directed. 65M Medipore H Soft Cloth Surgical T ape, 4 x 10 (in/yd) Discharge Instruction: Secure with tape as directed. Compression Wrap Compression Stockings Add-Ons Electronic Signature(s) Signed: 02/28/2022 5:25:46 PM By: Adline Peals Entered By: Adline Peals on 02/28/2022 10:18:22 -------------------------------------------------------------------------------- Vitals Details Patient Name: Date of Service: Anthony Barnes, Anthony [name] 02/28/2022 10:00 A M Medical Record Number: [id number] Patient Account Number: [id number] Date of Birth/Sex: Treating RN: Dec 25, 1955 (66 y.o. Anthony Barnes Primary Care Gilman Olazabal: Charlott Rakes Other Clinician: Referring Annais Crafts: Treating Janiece Scovill/Extender: Carter Kitten Weeks in Treatment: 8 Vital Signs Time Taken: 10:09 Temperature (F): 98 Height (in): 75 Pulse (bpm): 73 Weight (lbs): 310 Respiratory Rate (breaths/min): 18 Body Mass Index (BMI): 38.7 Blood Pressure (mmHg): 157/97 Reference Range: 80 - 120 mg / dl Electronic Signature(s) Signed: 02/28/2022 5:25:46 PM By: Adline Peals Entered By: Adline Peals on 02/28/2022 10:09:48

## 2022-03-07 ENCOUNTER — Other Ambulatory Visit: Payer: Self-pay | Admitting: Family Medicine

## 2022-03-07 ENCOUNTER — Encounter (HOSPITAL_BASED_OUTPATIENT_CLINIC_OR_DEPARTMENT_OTHER): Payer: Medicare Other | Admitting: General Surgery

## 2022-03-07 DIAGNOSIS — L97812 Non-pressure chronic ulcer of other part of right lower leg with fat layer exposed: Secondary | ICD-10-CM | POA: Diagnosis not present

## 2022-03-07 DIAGNOSIS — I872 Venous insufficiency (chronic) (peripheral): Secondary | ICD-10-CM | POA: Diagnosis not present

## 2022-03-07 DIAGNOSIS — E119 Type 2 diabetes mellitus without complications: Secondary | ICD-10-CM | POA: Diagnosis not present

## 2022-03-07 DIAGNOSIS — I10 Essential (primary) hypertension: Secondary | ICD-10-CM | POA: Diagnosis not present

## 2022-03-07 DIAGNOSIS — E11622 Type 2 diabetes mellitus with other skin ulcer: Secondary | ICD-10-CM | POA: Diagnosis not present

## 2022-03-07 NOTE — Telephone Encounter (Signed)
Requested medication (s) are due for refill today - provider review   Requested medication (s) are on the active medication list -yes  Future visit scheduled -yes  Last refill: 01/22/22 315  Notes to clinic: outside provider, non delegated Rx  Requested Prescriptions  Pending Prescriptions Disp Refills   traMADol (ULTRAM) 50 MG tablet [Pharmacy Med Name: traMADol HCl Oral Tablet 50 MG] 7 tablet 0    Sig: TAKE ONE TABLET BY MOUTH TWICE DAILY BEFORE AND AFTER DEBRIDEMENT PROCEDURE     Not Delegated - Analgesics:  Opioid Agonists Failed - 03/07/2022 10:14 AM      Failed - This refill cannot be delegated      Failed - Urine Drug Screen completed in last 360 days      Passed - Valid encounter within last 3 months    Recent Outpatient Visits           1 month ago Type 2 diabetes mellitus with other neurologic complication, without long-term current use of insulin (HCC)   [facility] [place], [place], MD   11 months ago Type 2 diabetes mellitus with other neurologic complication, without long-term current use of insulin (HCC)   [facility] [place], Odette Horns, MD   1 year ago Myalgia   [facility] [place], Mayo, MD   1 year ago Type 2 diabetes mellitus with other neurologic complication, without long-term current use of insulin (HCC)   [facility] [place], Kechi, MD   2 years ago Type 2 diabetes mellitus with diabetic polyneuropathy, without long-term current use of insulin (HCC)   [facility] [place], Odette Horns, MD                 Requested Prescriptions  Pending Prescriptions Disp Refills   traMADol (ULTRAM) 50 MG tablet [Pharmacy Med Name: traMADol HCl Oral Tablet 50 MG] 7 tablet 0    Sig: TAKE ONE TABLET BY MOUTH TWICE DAILY BEFORE AND AFTER DEBRIDEMENT PROCEDURE     Not Delegated - Analgesics:  Opioid Agonists Failed - 03/07/2022  10:14 AM      Failed - This refill cannot be delegated      Failed - Urine Drug Screen completed in last 360 days      Passed - Valid encounter within last 3 months    Recent Outpatient Visits           1 month ago Type 2 diabetes mellitus with other neurologic complication, without long-term current use of insulin (HCC)   [facility] [place], [place], MD   11 months ago Type 2 diabetes mellitus with other neurologic complication, without long-term current use of insulin (HCC)   [facility] [place], Odette Horns, MD   1 year ago Myalgia   [facility] [place], [place], MD   1 year ago Type 2 diabetes mellitus with other neurologic complication, without long-term current use of insulin (HCC)   [facility] [place], Odette Horns, MD   2 years ago Type 2 diabetes mellitus with diabetic polyneuropathy, without long-term current use of insulin (HCC)   [facility] Hoy Register, MD

## 2022-03-07 NOTE — Progress Notes (Signed)
Anthony Barnes, MAALOUF ([id number]) Visit Report for 03/07/2022 Chief Complaint Document Details Patient Name: Date of Service: [name] 03/07/2022 8:30 A M Medical Record Number: [id number] Patient Account Number: [id number] Date of Birth/Sex: Treating RN: 18-Mar-1956 (67 y.o. Anthony Barnes Primary Care Provider: Hoy Register Other Clinician: Referring Provider: Treating Provider/Extender: Camillo Flaming Weeks in Treatment: 9 Information Obtained from: Patient Chief Complaint Right LE Ulcer Electronic Signature(s) Signed: 03/07/2022 9:04:14 AM By: Duanne Guess MD FACS Entered By: Duanne Guess on 03/07/2022 09:04:13 -------------------------------------------------------------------------------- Debridement Details Patient Name: Date of Service: Anthony Barnes, [name] 03/07/2022 8:30 A M Medical Record Number: [id number] Patient Account Number: [id number] Date of Birth/Sex: Treating RN: Nov 25, 1955 (66 y.o. Anthony Barnes Primary Care Provider: Hoy Register Other Clinician: Referring Provider: Treating Provider/Extender: Camillo Flaming Weeks in Treatment: 9 Debridement Performed for Assessment: Wound #2 Right,Lateral Lower Leg Performed By: Physician Duanne Guess, MD Debridement Type: Debridement Severity of Tissue Pre Debridement: Fat layer exposed Level of Consciousness (Pre-procedure): Awake and Alert Pre-procedure Verification/Time Out Yes - 08:55 Taken: Start Time: 08:55 Pain Control: Lidocaine 5% topical ointment T Area Debrided (L x W): otal 4.5 (cm) x 2.1 (cm) = 9.45 (cm) Tissue and other material debrided: Non-Viable, Slough, Subcutaneous, Slough Level: Skin/Subcutaneous Tissue Debridement Description: Excisional Instrument: Curette Bleeding: Minimum Hemostasis Achieved: Pressure Procedural Pain: 5 Post Procedural Pain: 2 Response to Treatment: Procedure was tolerated well Level of  Consciousness (Post- Awake and Alert procedure): Post Debridement Measurements of Total Wound Length: (cm) 4.5 Width: (cm) 2.1 Depth: (cm) 0.1 Volume: (cm) 0.742 Character of Wound/Ulcer Post Debridement: Improved Severity of Tissue Post Debridement: Fat layer exposed Post Procedure Diagnosis Same as Pre-procedure Electronic Signature(s) Signed: 03/07/2022 10:20:13 AM By: Duanne Guess MD FACS Signed: 03/07/2022 4:29:40 PM By: Samuella Bruin Entered By: Samuella Bruin on 03/07/2022 08:59:28 -------------------------------------------------------------------------------- HPI Details Patient Name: Date of Service: Anthony Barnes, [name] 03/07/2022 8:30 A M Medical Record Number: [id number] Patient Account Number: [id number] Date of Birth/Sex: Treating RN: 1956/08/04 (66 y.o. Anthony Barnes Primary Care Provider: Hoy Register Other Clinician: Referring Provider: Treating Provider/Extender: Camillo Flaming Weeks in Treatment: 9 History of Present Illness HPI Description: ADMISSION 03/12/18 This is a 66 year old and it was a type II diabetic reasonably poorly controlled with a recent hemoglobin A1c of 8.2. He was tending to his lawn on 01/15/18 when his weed Anthony Barnes sent a rock up word hitting him in the right anterior leg. He was seen in his primary M.D. office is same time. An x-ray showed no abnormality. He was given a course of cephalexin. Since then he's been applying peroxide and topical antibiotics to the wound. He does not have a history of chronic wounds however he does have chronic edema in the lower legs. He is complaining of pain in the right leg wound but no real history of claudication. Past medical history includes type 2 diabetes, hypertension, morbid obesity. ABIs in the right leg were noncompressible 03/19/18 on evaluation today patient appears to be tolerating the wrap in general fairly well. He states he's not having that much pain in  regard to the wound itself. With that being said he is having some issues with slough buildup on the surface of the wound the Iodoflex does seem to be helpful in that regard. He is still having discomfort he wonders if I can send in a prescription for ibuprofen or something of like to help him out. I do believe that the  prox end may be of benefit for him. 03/25/18 on evaluation today patient appears to be doing very well in regard to his right lateral lower [name]. He's been tolerating the dressing changes without complication that is the wraps. Nonetheless he does continue to have pain he states is been dreading the possibility of having to have debridement again today. His first debridement experience was not optimal. With that being said he does seem to be showing signs of improvement there does appear to be little bit more granulation he does have a lot of slough that really does need to breeding away. 04/04/18;the patient went for arterial studies that were really quite normal. ABI on the right at 1.19 with triphasic waveforms on the left 1.11 with triphasic waveforms. TBI 0.93 on the right and 0.95 on the left. This suggests T should be able to tolerate even 4 layer compression. He is however complaining of a lot of pain with our wraps I'm wondering whether the pain is simply Iodoflex. I changed him to [employer] I'm still going to try to keep him in 3 layer compression 04/12/18 on evaluation today patient actually appears to be doing rather well in regard to his right lower [name] ulcer. He is making good progress although it is slow still I do believe that the Santyl is much better than the Iodoflex. The good news is the patient seems to be tolerating the dressing changes without complication now that we've gotten good of the Iodoflex which really did burn him quite significantly. Otherwise there's no evidence of infection. 04/17/18 on evaluation today  patient actually appears to be showing signs of improvement in regard to the ulcer. Unfortunately he's had somewhat of a rough day due to the fact that he found out that one of his friends suddenly and unexpectedly passed today. He states he's not sure he's in the frame of mind to allow me to debride the wound at this point. Nonetheless I do feel like he is showing signs of the wound getting better little by little each week. 04/24/18 on evaluation today patient's ulcer actually appears to be showing some signs of improvement albeit slow. He has been tolerating the dressing changes without complication except for the wrap which she states was so tight that he had to remove it it was causing a lot of discomfort. Fortunately there is no evidence of infection at this point. He states he only wore the wrap until about the time he got home last week and that he had to take it off. Nonetheless he does not have any other compression stockings, Juxta-Lite wraps, or anything otherwise to really help at this point as far as compression is concerned. 05/01/18 on evaluation today patient actually appears to be doing fairly well in regard to his lower extremity ulcer. He has been tolerating the dressing changes currently without complication. The wrap does seem to be helping as far as the fluid management is concerned. Wound bed itself actually show signs of good improvement although there is some Slough noted there's not as much as previous and he actually has fairly decent granulation noted as well. Overall I'm pleased with the progress of to this point. The patient would prefer not to have any debridement today he states he's actually not feeling too well in general and he really does not want any additional discomfort typically debridement is fairly uncomfortable for him. 05/08/18 on evaluation today patient actually appears to be doing a little  better in regard to his lower extremity ulcer. He has been tolerating  the dressing changes without complication. With that being said I'm actually quite pleased with the fact the wound is not appear to be infected and in general has made a little progress. With that being said I do believe we need to perform some debridement to clean away the necrotic tissue on the surface of the wound. 05/15/18 on evaluation today patient actually appears to be doing a little better in regard to his wound. Fortunately there is some improvement noted fortunately there's also no significant evidence of infection at this point in time. He does have continued pain that really nothing different than previous he did get his Juxta-Lite wrap. 05/29/18 on evaluation today patient actually appears to be doing somewhat better in regard to his wound currently. He's been tolerating the dressing changes without complication. With that being said he has been performing the [facility] Dressing changes every day at home. I'm pretty sure that we told him every other day last week or when I saw him rather two weeks ago. With that being said obviously did not hurt to do it daily just that obviously is gonna run out of supplies sooner and that could get him into trouble as far as his insurance is concerned. Nonetheless he at this point still continues to have pain although honestly I don't feel like it's as bad as what it was in the past just based on his reactions at this point. 06/12/18 on evaluation today patient actually appears to be doing better in regard to his lower [name] ulcer. He's been tolerating the dressing changes without complication. Fortunately there does not appear to be any evidence of infection at this time. No fevers, chills, nausea, or vomiting noted at this time. 06/19/18 evaluation today patient's ulcer on the lower extremity appears to be doing better at this point. he has been tolerating the dressing changes. The patient seems to be somewhat depressed about the progress of his  wound although the overall appearance at this time seems to be doing well. In general I'm very pleased with the appearance today he does have some biofilm on the surface of the wound as well as of hyper granular tissue we may want to utilize a little bit of silver nitrate today. 06/27/2018; patient comes into clinic today for a wound on the right lateral calf likely related to chronic venous insufficiency. He has been using medihoney covered with Hydrofera Blue. Wound surface actually looks quite good 07/03/2018 seen today for follow-up and management of lower extremity ulcer right lateral shin. T olerating dressing changes. He has a small amount of biofilm today. Will attempt to remove a portion of the biofilm as he tolerates. He becomes very anxious with wound treatments. He would benefit from layer compression wraps however he refuses compression wraps or anything that smokes his legs. He expressed an inability to tolerate compression wraps. Juxta lite wraps have been ordered. He has been instructed on the appropriate application of the juxta leg wraps. His blood pressure today on visit was 200/120. He denies any blurred vision, chest pain, dizziness, shortness of breath, or difficulty with mobility. Mr. Evinger stated that he had a recent visit with his primary care provider. At that time his carvedilol was decreased from 25 mg daily to 12.5 mg daily. Strongly encouraged Mr. Haberer to seek medical attention today during visit. He declined transport for evaluation of elevated blood pressure. Encouraged him to contact his primary  care provider today for medication management. He stated that he would call his primary care doctor after wound visit today. Also encouraged him that if he experienced any symptoms of blurred vision, chest pain, shortness of breath to immediately seek medical attention. 07/17/18 on evaluation today patient's wound actually does seem to show signs of improvement based on the  overall appearance of the wound bed today. There does not appear to show any signs of infection which is good news. There is no overall worsening which is also good news. He still has hyper granular tissue will use in the [employer] followed by [facility] Dressing this point. 07/31/18 on evaluation today patient's wound bed actually show signs of improvement with good epithelialization especially in the upper portion of the wound. He has been tolerating the dressing changes without complication in general. Overall I'm extremely happy with how things stand. No fevers, chills, nausea, or vomiting noted at this time. 08/28/18 on evaluation today patient appears to be doing a little better in regard to his lower extremity ulcer. With that being said it appears to be very hyper granular I think this is directly attributed to the fact that he continues to use the [employer] underneath the [facility] Dressing. Although this helps not to stick unfortunately also think he's not getting the benefit of the [facility] Dressing particular and subsequently is causing too much moisture buildup hence the hyper granulation. Fortunately there does not appear to be any signs of infection at this time which is good news. 09/03/18 on evaluation today patient appears to be doing well in regard to his lower [name] ulcer. He has been tolerating the dressing changes without complication. Fortunately he has much less hyper granular tissue the noted last week I do think using silver nitrate in changing to using the [facility] Dressing without the mepitel has made a big difference for him this is good news. 09/18/18 on evaluation today patient appears to be doing excellent in regard to his right lower [name] ulcer. This is actually significantly smaller even compared to the last evaluation. Overall I'm very pleased in this regard. I'm a recommend that we likely repeat the silver nitrate today based on what I'm  seeing. 10/02/18 on evaluation today patient appears to be doing excellent in regard to his lower extremity wound on the right. He's been tolerating the dressing changes without complication. Fortunately there's no signs of infection at this time. Overall been very pleased with how things seem to be progressing. No fevers, chills, nausea, or vomiting noted at this time. 10/16/18 on evaluation today patient actually appears to be doing much better in regard to his lower extremity wound on the right. This is shown signs of good improvement and is indeed measuring smaller he is on a excellent track as far as healing is concerned. My hope is this will be healed of the next several weeks. Fortunately there is no evidence of infection at this time. He does seem to be doing everything that I'm recommending for him 10/30/18 on evaluation today patient appears to be doing better in regard to his lower extremity ulcer. He's been tolerating the dressing changes without complication. Fortunately the Hydrofera Blue Dressing to be doing the job. He has made excellent progress. With that being said this is still going somewhat slow but nonetheless is always making good progress at this point. 5/18-Patient was in to be seen for right leg ulcer that appeared after scab above it was removed today. This area  had healed completely. 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 The wound has not healed though it has been nearly 2 months since the injury occurred. He was seen in the emergency department at [facility] on May 9. At that time it was very painful and he described it as a "15 on a scale of 010". He also was found to have 3+ pitting edema to the  bilateral lower extremities. He was prescribed a weeks course of Keflex. He is here today because the wound has failed to heal and he has a prior history in our clinic. ABI in clinic today was noncompressible. He did have formal vascular studies performed in 2019 which were normal. The last hemoglobin A1c I have available for review was from March 21, 2021 and was elevated at 7.7%. The wound is fairly small and circular located about 3 inches above his right lateral malleolus. It is completely covered with eschar. No surrounding erythema, no odor, no purulent drainage. 01/11/2022: The wound on his right lateral leg is a little bit smaller today. It has reaccumulated some slough and a small bit of eschar. It remains fairly painful. He has another area on his anterior tibia that he will not let me touch but I am concerned that there is a wound forming underneath the surface. 01/18/2022: Unfortunately, his wound is a little bit bigger today. He continues to accumulate slough on the wound surface. It is still quite tender. 01/25/2022: The patient bumped his wound on the car door which resulted in bleeding. He was seen at urgent care where it was redressed. Unfortunately, the wound is bigger again today. There is accumulated slough on the surface. Urgent care gave him some tramadol, apparently and he took this prior to his visit so he could tolerate debridement better. 02/02/2022: The wound is a little bit smaller today. The surface, however, has accumulated a fairly thick layer of slough. The periwound skin is intact and there is no obvious sign of infection. 02/07/2022: The wound is a bit larger today. It has thick slough accumulation. The periwound skin is intact and there is no obvious erythema, induration, nor any odor. 02/16/2022: I took a culture last week due to the appearance of the wound and the patient's degree of pain. This grew out methicillin sensitive Staph aureus. Augmentin was prescribed. The  patient states that the pain is markedly improved today. The wound also looks much better. It is smaller with less slough buildup and there is good granulation tissue emerging. 02/21/2022: The patient's pain is quite improved from prior visits. The wound is about the same size and has some accumulated slough on the surface. The base is a little bit fibrotic but there are some areas of granulation tissue filling in. 02/28/2022: The wound is a little bit narrower on its measurements but slightly longer. There is still some slough accumulation on the surface, but he has minimal pain and there is no concern for ongoing infection. Granulation tissue is emerging. 03/07/2022: During the week, he cut holes in his wrap because it was painful and too tight. As a result, his leg was quite a bit more swollen today and his wound was larger. The wound surface is fairly clean  but a little bit dry. Minimal slough accumulation. Granulation tissue present. Electronic Signature(s) Signed: 03/07/2022 9:05:16 AM By: Duanne Guess MD FACS Entered By: Duanne Guess on 03/07/2022 09:05:16 -------------------------------------------------------------------------------- Physical Exam Details Patient Name: Date of Service: Anthony Barnes, [name] 03/07/2022 8:30 A M Medical Record Number: [id number] Patient Account Number: [id number] Date of Birth/Sex: Treating RN: 02-19-1956 (66 y.o. Anthony Barnes Primary Care Provider: Hoy Register Other Clinician: Referring Provider: Treating Provider/Extender: Camillo Flaming Weeks in Treatment: 9 Constitutional Hypertensive, asymptomatic. . . . No acute distress.Marland Kitchen Respiratory Normal work of breathing on room air.. Notes 03/07/2022: His leg was quite a bit more swollen today and his wound was larger. The wound surface is fairly clean but a little bit dry. Minimal slough accumulation. Granulation tissue present. Electronic Signature(s) Signed:  03/07/2022 9:05:59 AM By: Duanne Guess MD FACS Entered By: Duanne Guess on 03/07/2022 09:05:59 -------------------------------------------------------------------------------- Physician Orders Details Patient Name: Date of Service: Anthony Barnes, [name] 03/07/2022 8:30 A M Medical Record Number: [id number] Patient Account Number: [id number] Date of Birth/Sex: Treating RN: 03-23-1956 (66 y.o. Anthony Barnes Primary Care Provider: Hoy Register Other Clinician: Referring Provider: Treating Provider/Extender: Camillo Flaming Weeks in Treatment: 9 Verbal / Phone Orders: No Diagnosis Coding ICD-10 Coding Code Description [phone] Non-pressure chronic ulcer of other part of right lower leg with fat layer exposed E11.622 Type 2 diabetes mellitus with other skin ulcer E11.9 Type 2 diabetes mellitus without complications I10 Essential (primary) hypertension I87.2 Venous insufficiency (chronic) (peripheral) Follow-up Appointments ppointment in 1 week. - Dr. Lady Gary - Room 2 - 7/31 at 2:15 PM Return A Bathing/ Shower/ Hygiene May shower with protection but do not get wound dressing(s) wet. - may purchase a cast protector from [facility] or [facility] Edema Control - Lymphedema / SCD / Other Elevate legs to the level of the heart or above for 30 minutes daily and/or when sitting, a frequency of: Avoid standing for long periods of time. Patient to wear own compression stockings every day. Exercise regularly Moisturize legs daily. Compression stocking or Garment 20-30 mm/Hg pressure to: - to L left until R leg is healed Wound Treatment Wound #2 - Lower Leg Wound Laterality: Right, Lateral Cleanser: Soap and Water 1 x Per Week/30 Days Discharge Instructions: May shower and wash wound with dial antibacterial soap and water prior to dressing change. Cleanser: Wound Cleanser 1 x Per Week/30 Days Discharge Instructions: Cleanse the wound with wound cleanser prior to applying  a clean dressing using gauze sponges, not tissue or cotton balls. Peri-Wound Care: Sween Lotion (Moisturizing lotion) 1 x Per Week/30 Days Discharge Instructions: Apply moisturizing lotion as directed Prim Dressing: Promogran Prisma Matrix, 4.34 (sq in) (silver collagen) 1 x Per Week/30 Days ary Discharge Instructions: Moisten collagen with saline or hydrogel Secondary Dressing: Woven Gauze Sponge, Non-Sterile 4x4 in 1 x Per Week/30 Days Discharge Instructions: Apply over primary dressing as directed. Secured With: Elastic Bandage 4 inch (ACE bandage) 1 x Per Week/30 Days Discharge Instructions: Secure with ACE bandage as directed. Secured With: [employer], 4.5x3.1 (in/yd) 1 x Per Week/30 Days Discharge Instructions: Secure with Kerlix as directed. Secured With: 52M Medipore H Soft Cloth Surgical T ape, 4 x 10 (in/yd) 1 x Per Week/30 Days Discharge Instructions: Secure with tape as directed. Electronic Signature(s) Signed: 03/07/2022 9:06:20 AM By: Duanne Guess MD FACS Entered By: Duanne Guess on 03/07/2022 09:06:19 -------------------------------------------------------------------------------- Problem List Details Patient Name: Date of Service: Anthony Barnes, [name] 03/07/2022 8:30  A M Medical Record Number: [id number] Patient Account Number: [id number] Date of Birth/Sex: Treating RN: October 11, 1955 (66 y.o. Anthony Barnes Primary Care Provider: Hoy Register Other Clinician: Referring Provider: Treating Provider/Extender: Camillo Flaming Weeks in Treatment: 9 Active Problems ICD-10 Encounter Code Description Active Date MDM Diagnosis L97.812 Non-pressure chronic ulcer of other part of right lower leg with fat layer 01/02/2022 No Yes exposed E11.622 Type 2 diabetes mellitus with other skin ulcer 01/02/2022 No Yes E11.9 Type 2 diabetes mellitus without complications 01/02/2022 No Yes I10 Essential (primary) hypertension 01/02/2022 No Yes I87.2  Venous insufficiency (chronic) (peripheral) 01/02/2022 No Yes Inactive Problems Resolved Problems Electronic Signature(s) Signed: 03/07/2022 9:03:56 AM By: Duanne Guess MD FACS Entered By: Duanne Guess on 03/07/2022 09:03:56 -------------------------------------------------------------------------------- Progress Note Details Patient Name: Date of Service: Anthony Barnes, [name] 03/07/2022 8:30 A M Medical Record Number: [id number] Patient Account Number: [id number] Date of Birth/Sex: Treating RN: 11-12-1955 (66 y.o. Anthony Barnes Primary Care Provider: Hoy Register Other Clinician: Referring Provider: Treating Provider/Extender: Camillo Flaming Weeks in Treatment: 9 Subjective Chief Complaint Information obtained from Patient Right LE Ulcer History of Present Illness (HPI) ADMISSION 03/12/18 This is a 66 year old and it was a type II diabetic reasonably poorly controlled with a recent hemoglobin A1c of 8.2. He was tending to his lawn on 01/15/18 when his weed Anthony Barnes sent a rock up word hitting him in the right anterior leg. He was seen in his primary M.D. office is same time. An x-ray showed no abnormality. He was given a course of cephalexin. Since then he's been applying peroxide and topical antibiotics to the wound. He does not have a history of chronic wounds however he does have chronic edema in the lower legs. He is complaining of pain in the right leg wound but no real history of claudication. Past medical history includes type 2 diabetes, hypertension, morbid obesity. ABIs in the right leg were noncompressible 03/19/18 on evaluation today patient appears to be tolerating the wrap in general fairly well. He states he's not having that much pain in regard to the wound itself. With that being said he is having some issues with slough buildup on the surface of the wound the Iodoflex does seem to be helpful in that regard. He is still having discomfort he  wonders if I can send in a prescription for ibuprofen or something of like to help him out. I do believe that the prox end may be of benefit for him. 03/25/18 on evaluation today patient appears to be doing very well in regard to his right lateral lower [name]. He's been tolerating the dressing changes without complication that is the wraps. Nonetheless he does continue to have pain he states is been dreading the possibility of having to have debridement again today. His first debridement experience was not optimal. With that being said he does seem to be showing signs of improvement there does appear to be little bit more granulation he does have a lot of slough that really does need to breeding away. 04/04/18;the patient went for arterial studies that were really quite normal. ABI on the right at 1.19 with triphasic waveforms on the left 1.11 with triphasic waveforms. TBI 0.93 on the right and 0.95 on the left. This suggests T should be able to tolerate even 4 layer compression. He is however complaining of a lot of pain with our wraps I'm wondering whether the pain is simply Iodoflex. I changed him to [facility]  covering Hydrofera Blue. I'm still going to try to keep him in 3 layer compression 04/12/18 on evaluation today patient actually appears to be doing rather well in regard to his right lower [name] ulcer. He is making good progress although it is slow still I do believe that the Santyl is much better than the Iodoflex. The good news is the patient seems to be tolerating the dressing changes without complication now that we've gotten good of the Iodoflex which really did burn him quite significantly. Otherwise there's no evidence of infection. 04/17/18 on evaluation today patient actually appears to be showing signs of improvement in regard to the ulcer. Unfortunately he's had somewhat of a rough day due to the fact that he found out that one of his friends suddenly and  unexpectedly passed today. He states he's not sure he's in the frame of mind to allow me to debride the wound at this point. Nonetheless I do feel like he is showing signs of the wound getting better little by little each week. 04/24/18 on evaluation today patient's ulcer actually appears to be showing some signs of improvement albeit slow. He has been tolerating the dressing changes without complication except for the wrap which she states was so tight that he had to remove it it was causing a lot of discomfort. Fortunately there is no evidence of infection at this point. He states he only wore the wrap until about the time he got home last week and that he had to take it off. Nonetheless he does not have any other compression stockings, Juxta-Lite wraps, or anything otherwise to really help at this point as far as compression is concerned. 05/01/18 on evaluation today patient actually appears to be doing fairly well in regard to his lower extremity ulcer. He has been tolerating the dressing changes currently without complication. The wrap does seem to be helping as far as the fluid management is concerned. Wound bed itself actually show signs of good improvement although there is some Slough noted there's not as much as previous and he actually has fairly decent granulation noted as well. Overall I'm pleased with the progress of to this point. The patient would prefer not to have any debridement today he states he's actually not feeling too well in general and he really does not want any additional discomfort typically debridement is fairly uncomfortable for him. 05/08/18 on evaluation today patient actually appears to be doing a little better in regard to his lower extremity ulcer. He has been tolerating the dressing changes without complication. With that being said I'm actually quite pleased with the fact the wound is not appear to be infected and in general has made a little progress. With that being  said I do believe we need to perform some debridement to clean away the necrotic tissue on the surface of the wound. 05/15/18 on evaluation today patient actually appears to be doing a little better in regard to his wound. Fortunately there is some improvement noted fortunately there's also no significant evidence of infection at this point in time. He does have continued pain that really nothing different than previous he did get his Juxta-Lite wrap. 05/29/18 on evaluation today patient actually appears to be doing somewhat better in regard to his wound currently. He's been tolerating the dressing changes without complication. With that being said he has been performing the [facility] Dressing changes every day at home. I'm pretty sure that we told him every other day last week or  when I saw him rather two weeks ago. With that being said obviously did not hurt to do it daily just that obviously is gonna run out of supplies sooner and that could get him into trouble as far as his insurance is concerned. Nonetheless he at this point still continues to have pain although honestly I don't feel like it's as bad as what it was in the past just based on his reactions at this point. 06/12/18 on evaluation today patient actually appears to be doing better in regard to his lower [name] ulcer. He's been tolerating the dressing changes without complication. Fortunately there does not appear to be any evidence of infection at this time. No fevers, chills, nausea, or vomiting noted at this time. 06/19/18 evaluation today patient's ulcer on the lower extremity appears to be doing better at this point. he has been tolerating the dressing changes. The patient seems to be somewhat depressed about the progress of his wound although the overall appearance at this time seems to be doing well. In general I'm very pleased with the appearance today he does have some biofilm on the surface of the wound as well as of hyper  granular tissue we may want to utilize a little bit of silver nitrate today. 06/27/2018; patient comes into clinic today for a wound on the right lateral calf likely related to chronic venous insufficiency. He has been using medihoney covered with Hydrofera Blue. Wound surface actually looks quite good 07/03/2018 seen today for follow-up and management of lower extremity ulcer right lateral shin. T olerating dressing changes. He has a small amount of biofilm today. Will attempt to remove a portion of the biofilm as he tolerates. He becomes very anxious with wound treatments. He would benefit from layer compression wraps however he refuses compression wraps or anything that smokes his legs. He expressed an inability to tolerate compression wraps. Juxta lite wraps have been ordered. He has been instructed on the appropriate application of the juxta leg wraps. His blood pressure today on visit was 200/120. He denies any blurred vision, chest pain, dizziness, shortness of breath, or difficulty with mobility. Mr. Krenn stated that he had a recent visit with his primary care provider. At that time his carvedilol was decreased from 25 mg daily to 12.5 mg daily. Strongly encouraged Mr. Yeakle to seek medical attention today during visit. He declined transport for evaluation of elevated blood pressure. Encouraged him to contact his primary care provider today for medication management. He stated that he would call his primary care doctor after wound visit today. Also encouraged him that if he experienced any symptoms of blurred vision, chest pain, shortness of breath to immediately seek medical attention. 07/17/18 on evaluation today patient's wound actually does seem to show signs of improvement based on the overall appearance of the wound bed today. There does not appear to show any signs of infection which is good news. There is no overall worsening which is also good news. He still has hyper granular  tissue will use in the [employer] followed by [facility] Dressing this point. 07/31/18 on evaluation today patient's wound bed actually show signs of improvement with good epithelialization especially in the upper portion of the wound. He has been tolerating the dressing changes without complication in general. Overall I'm extremely happy with how things stand. No fevers, chills, nausea, or vomiting noted at this time. 08/28/18 on evaluation today patient appears to be doing a little better in regard to his lower extremity ulcer.  With that being said it appears to be very hyper granular I think this is directly attributed to the fact that he continues to use the [employer] underneath the [facility] Dressing. Although this helps not to stick unfortunately also think he's not getting the benefit of the [facility] Dressing particular and subsequently is causing too much moisture buildup hence the hyper granulation. Fortunately there does not appear to be any signs of infection at this time which is good news. 09/03/18 on evaluation today patient appears to be doing well in regard to his lower [name] ulcer. He has been tolerating the dressing changes without complication. Fortunately he has much less hyper granular tissue the noted last week I do think using silver nitrate in changing to using the [facility] Dressing without the mepitel has made a big difference for him this is good news. 09/18/18 on evaluation today patient appears to be doing excellent in regard to his right lower [name] ulcer. This is actually significantly smaller even compared to the last evaluation. Overall I'm very pleased in this regard. I'm a recommend that we likely repeat the silver nitrate today based on what I'm seeing. 10/02/18 on evaluation today patient appears to be doing excellent in regard to his lower extremity wound on the right. He's been tolerating the dressing changes without complication. Fortunately  there's no signs of infection at this time. Overall been very pleased with how things seem to be progressing. No fevers, chills, nausea, or vomiting noted at this time. 10/16/18 on evaluation today patient actually appears to be doing much better in regard to his lower extremity wound on the right. This is shown signs of good improvement and is indeed measuring smaller he is on a excellent track as far as healing is concerned. My hope is this will be healed of the next several weeks. Fortunately there is no evidence of infection at this time. He does seem to be doing everything that I'm recommending for him 10/30/18 on evaluation today patient appears to be doing better in regard to his lower extremity ulcer. He's been tolerating the dressing changes without complication. Fortunately the Hydrofera Blue Dressing to be doing the job. He has made excellent progress. With that being said this is still going somewhat slow but nonetheless is always making good progress at this point. 5/18-Patient was in to be seen for right leg ulcer that appeared after scab above it was removed today. This area had healed completely. 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 The wound has not healed though it has been nearly 2 months since the injury occurred. He was seen in the emergency department at [facility] on May 9. At that time it was very painful and he described it as a "15 on a scale of 0oo10". He also was found to have 3+ pitting edema to the bilateral lower extremities. He was prescribed a weeks course of Keflex. He is here today because the wound has failed to heal and he has a prior history in our clinic. ABI in clinic today was  noncompressible. He did have formal vascular studies performed in 2019 which were normal. The last hemoglobin A1c I have available for review was from March 21, 2021 and was elevated at 7.7%. The wound is fairly small and circular located about 3 inches above his right lateral malleolus. It is completely covered with eschar. No surrounding erythema, no odor, no purulent drainage. 01/11/2022: The wound on his right lateral leg is a little bit smaller today. It has reaccumulated some slough and a small bit of eschar. It remains fairly painful. He has another area on his anterior tibia that he will not let me touch but I am concerned that there is a wound forming underneath the surface. 01/18/2022: Unfortunately, his wound is a little bit bigger today. He continues to accumulate slough on the wound surface. It is still quite tender. 01/25/2022: The patient bumped his wound on the car door which resulted in bleeding. He was seen at urgent care where it was redressed. Unfortunately, the wound is bigger again today. There is accumulated slough on the surface. Urgent care gave him some tramadol, apparently and he took this prior to his visit so he could tolerate debridement better. 02/02/2022: The wound is a little bit smaller today. The surface, however, has accumulated a fairly thick layer of slough. The periwound skin is intact and there is no obvious sign of infection. 02/07/2022: The wound is a bit larger today. It has thick slough accumulation. The periwound skin is intact and there is no obvious erythema, induration, nor any odor. 02/16/2022: I took a culture last week due to the appearance of the wound and the patient's degree of pain. This grew out methicillin sensitive Staph aureus. Augmentin was prescribed. The patient states that the pain is markedly improved today. The wound also looks much better. It is smaller with less slough buildup and there is good granulation tissue emerging. 02/21/2022: The  patient's pain is quite improved from prior visits. The wound is about the same size and has some accumulated slough on the surface. The base is a little bit fibrotic but there are some areas of granulation tissue filling in. 02/28/2022: The wound is a little bit narrower on its measurements but slightly longer. There is still some slough accumulation on the surface, but he has minimal pain and there is no concern for ongoing infection. Granulation tissue is emerging. 03/07/2022: During the week, he cut holes in his wrap because it was painful and too tight. As a result, his leg was quite a bit more swollen today and his wound was larger. The wound surface is fairly clean but a little bit dry. Minimal slough accumulation. Granulation tissue present. Patient History Information obtained from Patient. Family History Cancer - Mother, Diabetes - Mother,Father, Hypertension - Mother,Father, Kidney Disease - Siblings, Stroke - Father, No family history of Heart Disease, Hereditary Spherocytosis, Lung Disease, Seizures, Thyroid Problems, Tuberculosis. Social History Former smoker - ended on 08/14/1990, Marital Status - Married, Alcohol Use - Moderate, Drug Use - No History, Caffeine Use - Daily. Medical History Eyes Denies history of Cataracts, Glaucoma, Optic Neuritis Ear/Nose/Mouth/Throat Denies history of Chronic sinus problems/congestion, Middle ear problems Hematologic/Lymphatic Denies history of Anemia, Hemophilia, Human Immunodeficiency Virus, Lymphedema, Sickle Cell Disease Respiratory Denies history of Aspiration, Asthma, Chronic Obstructive Pulmonary Disease (COPD), Pneumothorax, Sleep Apnea, Tuberculosis Cardiovascular Patient has history of Hypertension Denies history of Angina, Arrhythmia, Congestive Heart Failure, Coronary Artery Disease, Deep Vein Thrombosis, Hypotension, Myocardial Infarction, Peripheral Arterial Disease, Peripheral Venous Disease, Phlebitis,  Vasculitis Gastrointestinal Denies history of Cirrhosis , Colitis, Crohnoos, Hepatitis A, Hepatitis B, Hepatitis C Endocrine Patient has history of Type II Diabetes Genitourinary Denies history of  End Stage Renal Disease Immunological Denies history of Lupus Erythematosus, Raynaudoos, Scleroderma Integumentary (Skin) Denies history of History of Burn Musculoskeletal Denies history of Gout, Rheumatoid Arthritis, Osteoarthritis, Osteomyelitis Neurologic Denies history of Dementia, Neuropathy, Quadriplegia, Paraplegia, Seizure Disorder Oncologic Denies history of Received Chemotherapy, Received Radiation Psychiatric Denies history of Anorexia/bulimia, Confinement Anxiety Hospitalization/Surgery History - esophagogastroduodenoscopy. - brain aneurysm surgery. Medical A Surgical History Notes nd Constitutional Symptoms (General Health) obesity Gastrointestinal diverticulitis Neurologic neuropathy Objective Constitutional Hypertensive, asymptomatic. No acute distress.. Vitals Time Taken: 8:41 AM, Height: 75 in, Weight: 310 lbs, BMI: 38.7, Temperature: 98.7 F, Pulse: 83 bpm, Respiratory Rate: 18 breaths/min, Blood Pressure: 162/94 mmHg. Respiratory Normal work of breathing on room air.. General Notes: 03/07/2022: His leg was quite a bit more swollen today and his wound was larger. The wound surface is fairly clean but a little bit dry. Minimal slough accumulation. Granulation tissue present. Integumentary (Hair, Skin) Wound #2 status is Open. Original cause of wound was Trauma. The date acquired was: 12/03/2021. The wound has been in treatment 9 weeks. The wound is located on the Right,Lateral Lower Leg. The wound measures 4.5cm length x 2.1cm width x 0.1cm depth; 7.422cm^2 area and 0.742cm^3 volume. There is Fat Layer (Subcutaneous Tissue) exposed. There is no tunneling or undermining noted. There is a medium amount of serosanguineous drainage noted. The wound margin is distinct  with the outline attached to the wound base. There is large (67-100%) red granulation within the wound bed. There is a small (1-33%) amount of necrotic tissue within the wound bed including Adherent Slough. Assessment Active Problems ICD-10 Non-pressure chronic ulcer of other part of right lower leg with fat layer exposed Type 2 diabetes mellitus with other skin ulcer Type 2 diabetes mellitus without complications Essential (primary) hypertension Venous insufficiency (chronic) (peripheral) Procedures Wound #2 Pre-procedure diagnosis of Wound #2 is a Venous Leg Ulcer located on the Right,Lateral Lower Leg .Severity of Tissue Pre Debridement is: Fat layer exposed. There was a Excisional Skin/Subcutaneous Tissue Debridement with a total area of 9.45 sq cm performed by Duanne Guessannon, Motty Borin, MD. With the following instrument(s): Curette to remove Non-Viable tissue/material. Material removed includes Subcutaneous Tissue and Slough and after achieving pain control using Lidocaine 5% topical ointment. No specimens were taken. A time out was conducted at 08:55, prior to the start of the procedure. A Minimum amount of bleeding was controlled with Pressure. The procedure was tolerated well with a pain level of 5 throughout and a pain level of 2 following the procedure. Post Debridement Measurements: 4.5cm length x 2.1cm width x 0.1cm depth; 0.742cm^3 volume. Character of Wound/Ulcer Post Debridement is improved. Severity of Tissue Post Debridement is: Fat layer exposed. Post procedure Diagnosis Wound #2: Same as Pre-Procedure Plan Follow-up Appointments: Return Appointment in 1 week. - Dr. Lady Garyannon - Room 2 - 7/31 at 2:15 PM Bathing/ Shower/ Hygiene: May shower with protection but do not get wound dressing(s) wet. - may purchase a cast protector from [facility] or [facility] Edema Control - Lymphedema / SCD / Other: Elevate legs to the level of the heart or above for 30 minutes daily and/or when sitting, a  frequency of: Avoid standing for long periods of time. Patient to wear own compression stockings every day. Exercise regularly Moisturize legs daily. Compression stocking or Garment 20-30 mm/Hg pressure to: - to L left until R leg is healed WOUND #2: - Lower Leg Wound Laterality: Right, Lateral Cleanser: Soap and Water 1 x Per Week/30 Days Discharge Instructions: May shower and wash wound  with dial antibacterial soap and water prior to dressing change. Cleanser: Wound Cleanser 1 x Per Week/30 Days Discharge Instructions: Cleanse the wound with wound cleanser prior to applying a clean dressing using gauze sponges, not tissue or cotton balls. Peri-Wound Care: Sween Lotion (Moisturizing lotion) 1 x Per Week/30 Days Discharge Instructions: Apply moisturizing lotion as directed Prim Dressing: Promogran Prisma Matrix, 4.34 (sq in) (silver collagen) 1 x Per Week/30 Days ary Discharge Instructions: Moisten collagen with saline or hydrogel Secondary Dressing: Woven Gauze Sponge, Non-Sterile 4x4 in 1 x Per Week/30 Days Discharge Instructions: Apply over primary dressing as directed. Secured With: Elastic Bandage 4 inch (ACE bandage) 1 x Per Week/30 Days Discharge Instructions: Secure with ACE bandage as directed. Secured With: [employer], 4.5x3.1 (in/yd) 1 x Per Week/30 Days Discharge Instructions: Secure with Kerlix as directed. Secured With: 49M Medipore H Soft Cloth Surgical T ape, 4 x 10 (in/yd) 1 x Per Week/30 Days Discharge Instructions: Secure with tape as directed. 03/07/2022: His leg was quite a bit more swollen today and his wound was larger. The wound surface is fairly clean but a little bit dry. Minimal slough accumulation. Granulation tissue present. I used a curette to debride slough and nonviable subcutaneous tissue from his wound. I think we can discontinue the Santyl. Due to the somewhat dry surface, I am going to change him to Prisma silver collagen. We will use Kerlix and  ace wrap for his compression. He was reminded that he needs to keep his foot elevated at all times. 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 Kerecis may be his best option but we will need to see about getting that in the clinic. Electronic Signature(s) Signed: 03/07/2022 9:15:19 AM By: Duanne Guess MD FACS Entered By: Duanne Guess on 03/07/2022 09:15:19 -------------------------------------------------------------------------------- HxROS Details Patient Name: Date of Service: Anthony Barnes, [name] 03/07/2022 8:30 A M Medical Record Number: [id number] Patient Account Number: [id number] Date of Birth/Sex: Treating RN: 01/26/1956 (66 y.o. Anthony Barnes Primary Care Provider: Hoy Register Other Clinician: Referring Provider: Treating Provider/Extender: Camillo Flaming Weeks in Treatment: 9 Information Obtained From Patient Constitutional Symptoms (General Health) Medical History: Past Medical History Notes: obesity Eyes Medical History: Negative for: Cataracts; Glaucoma; Optic Neuritis Ear/Nose/Mouth/Throat Medical History: Negative for: Chronic sinus problems/congestion; Middle ear problems Hematologic/Lymphatic Medical History: Negative for: Anemia; Hemophilia; Human Immunodeficiency Virus; Lymphedema; Sickle Cell Disease Respiratory Medical History: Negative for: Aspiration; Asthma; Chronic Obstructive Pulmonary Disease (COPD); Pneumothorax; Sleep Apnea; Tuberculosis Cardiovascular Medical History: Positive for: Hypertension Negative for: Angina; Arrhythmia; Congestive Heart Failure; Coronary Artery Disease; Deep Vein Thrombosis; Hypotension; Myocardial Infarction; Peripheral Arterial Disease; Peripheral Venous Disease; Phlebitis; Vasculitis Gastrointestinal Medical History: Negative for: Cirrhosis ; Colitis; Crohns; Hepatitis  A; Hepatitis B; Hepatitis C Past Medical History Notes: diverticulitis Endocrine Medical History: Positive for: Type II Diabetes Time with diabetes: 8 years Treated with: Oral agents Blood sugar tested every day: No Genitourinary Medical History: Negative for: End Stage Renal Disease Immunological Medical History: Negative for: Lupus Erythematosus; Raynauds; Scleroderma Integumentary (Skin) Medical History: Negative for: History of Burn Musculoskeletal Medical History: Negative for: Gout; Rheumatoid Arthritis; Osteoarthritis; Osteomyelitis Neurologic Medical History: Negative for: Dementia; Neuropathy; Quadriplegia; Paraplegia; Seizure Disorder Past Medical History Notes: neuropathy Oncologic Medical History: Negative for: Received Chemotherapy; Received Radiation Psychiatric Medical History: Negative for: Anorexia/bulimia; Confinement Anxiety Immunizations Pneumococcal Vaccine: Received Pneumococcal Vaccination: No Immunization  Notes: tetanus 2 years ago Implantable Devices None Hospitalization / Surgery History Type of Hospitalization/Surgery esophagogastroduodenoscopy brain aneurysm surgery Family and Social History Cancer: Yes - Mother; Diabetes: Yes - Mother,Father; Heart Disease: No; Hereditary Spherocytosis: No; Hypertension: Yes - Mother,Father; Kidney Disease: Yes - Siblings; Lung Disease: No; Seizures: No; Stroke: Yes - Father; Thyroid Problems: No; Tuberculosis: No; Former smoker - ended on 08/14/1990; Marital Status - Married; Alcohol Use: Moderate; Drug Use: No History; Caffeine Use: Daily; Financial Concerns: No; Food, Clothing or Shelter Needs: No; Support System Lacking: No; Transportation Concerns: No Electronic Signature(s) Signed: 03/07/2022 10:20:13 AM By: Duanne Guess MD FACS Signed: 03/07/2022 4:29:40 PM By: Samuella Bruin Entered By: Duanne Guess on 03/07/2022  09:05:21 -------------------------------------------------------------------------------- SuperBill Details Patient Name: Date of Service: Anthony Barnes, [name] 03/07/2022 Medical Record Number: [id number] Patient Account Number: [id number] Date of Birth/Sex: Treating RN: 02-13-1956 (66 y.o. Anthony Barnes Primary Care Provider: Hoy Register Other Clinician: Referring Provider: Treating Provider/Extender: Camillo Flaming Weeks in Treatment: 9 Diagnosis Coding ICD-10 Codes Code Description [id number] Non-pressure chronic ulcer of other part of right lower leg with fat layer exposed E11.622 Type 2 diabetes mellitus with other skin ulcer E11.9 Type 2 diabetes mellitus without complications I10 Essential (primary) hypertension I87.2 Venous insufficiency (chronic) (peripheral) Facility Procedures CPT4 Code: [id number] Description: 11042 - DEB SUBQ TISSUE 20 SQ CM/< ICD-10 Diagnosis Description 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 Modifier: osed Quantity: 1 Physician Procedures : CPT4 Code Description Modifier [id number] 99213 - WC PHYS LEVEL 3 - EST PT 25 ICD-10 Diagnosis Description 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osed I87.2 Venous insufficiency (chronic) (peripheral) E11.622  Type 2 diabetes mellitus with other skin ulcer I10 Essential (primary) hypertension Quantity: 1 : [id number] 11042 - WC PHYS SUBQ TISS 20 SQ CM ICD-10 Diagnosis Description 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osed Quantity: 1 Electronic Signature(s) Signed: 03/07/2022 9:15:40 AM By: Duanne Guess MD FACS Entered By: Duanne Guess on 03/07/2022 09:15:40

## 2022-03-07 NOTE — Progress Notes (Signed)
SOREN, LAZARZ ([id number]) Visit Report for 03/07/2022 Arrival Information Details Patient Name: Date of Service: Anthony Barnes [name] 03/07/2022 8:30 A M Medical Record Number: [id number] Patient Account Number: [id number] Date of Birth/Sex: Treating RN: 1956/07/09 (66 y.o. Anthony Barnes Primary Care Javion Holmer: Charlott Rakes Other Clinician: Referring Brylin Stanislawski: Treating Yaneliz Radebaugh/Extender: Carter Kitten Weeks in Treatment: 9 Visit Information History Since Last Visit Added or deleted any medications: No Patient Arrived: Ambulatory Any new allergies or adverse reactions: No Arrival Time: 08:39 Had a fall or experienced change in No Accompanied By: self activities of daily living that may affect Transfer Assistance: None risk of falls: Patient Identification Verified: Yes Signs or symptoms of abuse/neglect since last visito No Secondary Verification Process Completed: Yes Hospitalized since last visit: No Patient Requires Transmission-Based Precautions: No Implantable device outside of the clinic excluding No cellular tissue based products placed in the center since last visit: Has Dressing in Place as Prescribed: Yes Has Compression in Place as Prescribed: Yes Pain Present Now: Yes Electronic Signature(s) Signed: 03/07/2022 4:29:40 PM By: Adline Peals Entered By: Adline Peals on 03/07/2022 08:41:15 -------------------------------------------------------------------------------- Encounter Discharge Information Details Patient Name: Date of Service: Anthony Barnes, [name] 03/07/2022 8:30 A M Medical Record Number: [id number] Patient Account Number: [id number] Date of Birth/Sex: Treating RN: [birth year] (66 y.o. Anthony Barnes Primary Care Rica Heather: Charlott Rakes Other Clinician: Referring Anaid Haney: Treating Kolden Dupee/Extender: Carter Kitten Weeks in Treatment: 9 Encounter Discharge Information Items Post  Procedure Vitals Discharge Condition: Stable Temperature (F): 98.7 Ambulatory Status: Ambulatory Pulse (bpm): 83 Discharge Destination: Home Respiratory Rate (breaths/min): 18 Transportation: Private Auto Blood Pressure (mmHg): 162/94 Accompanied By: self Schedule Follow-up Appointment: Yes Clinical Summary of Care: Patient Declined Electronic Signature(s) Signed: 03/07/2022 4:29:40 PM By: Adline Peals Entered By: Adline Peals on 03/07/2022 11:02:20 -------------------------------------------------------------------------------- Lower Extremity Assessment Details Patient Name: Date of Service: Anthony Barnes [name] 03/07/2022 8:30 A M Medical Record Number: [id number] Patient Account Number: [id number] Date of Birth/Sex: Treating RN: 1955-09-17 (66 y.o. Anthony Barnes Primary Care Izak Anding: Charlott Rakes Other Clinician: Referring Kamil Hanigan: Treating Chauncey Bruno/Extender: Carter Kitten Weeks in Treatment: 9 Edema Assessment Assessed: [Left: No] [Right: No] E[Left: dema] [Right: :] Calf Left: Right: Point of Measurement: From Medial Instep 46.5 cm Ankle Left: Right: Point of Measurement: From Medial Instep 27.8 cm Vascular Assessment Pulses: Dorsalis Pedis Palpable: [Right:Yes] Electronic Signature(s) Signed: 03/07/2022 4:29:40 PM By: Adline Peals Entered By: Adline Peals on 03/07/2022 08:47:06 -------------------------------------------------------------------------------- Multi Wound Chart Details Patient Name: Date of Service: Anthony Barnes, [name] 03/07/2022 8:30 A M Medical Record Number: [id number] Patient Account Number: [id number] Date of Birth/Sex: Treating RN: [birth year] (66 y.o. Anthony Barnes Primary Care Erroll Wilbourne: Charlott Rakes Other Clinician: Referring Nusaiba Guallpa: Treating Saiya Crist/Extender: Carter Kitten Weeks in Treatment: 9 Vital Signs Height(in): 75 Pulse(bpm):  83 Weight(lbs): 310 Blood Pressure(mmHg): 162/94 Body Mass Index(BMI): 38.7 Temperature(F): 98.7 Respiratory Rate(breaths/min): 18 Photos: [N/A:N/A] Right, Lateral Lower Leg N/A N/A Wound Location: Trauma N/A N/A Wounding Event: Venous Leg Ulcer N/A N/A Primary Etiology: Hypertension, Type II Diabetes N/A N/A Comorbid History: 12/03/2021 N/A N/A Date Acquired: 9 N/A N/A Weeks of Treatment: Open N/A N/A Wound Status: No N/A N/A Wound Recurrence: 4.5x2.1x0.1 N/A N/A Measurements L x W x D (cm) 7.422 N/A N/A A (cm) : rea 0.742 N/A N/A Volume (cm) : -600.20% N/A N/A % Reduction in A rea: -600.00% N/A N/A % Reduction in Volume: Full Thickness Without Exposed N/A N/A Classification:  Support Structures Medium N/A N/A Exudate A mount: Serosanguineous N/A N/A Exudate Type: red, brown N/A N/A Exudate Color: Distinct, outline attached N/A N/A Wound Margin: Large (67-100%) N/A N/A Granulation A mount: Red N/A N/A Granulation Quality: Small (1-33%) N/A N/A Necrotic A mount: Fat Layer (Subcutaneous Tissue): Yes N/A N/A Exposed Structures: Fascia: No Tendon: No Muscle: No Joint: No Bone: No Small (1-33%) N/A N/A Epithelialization: Debridement - Excisional N/A N/A Debridement: Pre-procedure Verification/Time Out 08:55 N/A N/A Taken: Lidocaine 5% topical ointment N/A N/A Pain Control: Subcutaneous, Slough N/A N/A Tissue Debrided: Skin/Subcutaneous Tissue N/A N/A Level: 9.45 N/A N/A Debridement A (sq cm): rea Curette N/A N/A Instrument: Minimum N/A N/A Bleeding: Pressure N/A N/A Hemostasis A chieved: 5 N/A N/A Procedural Pain: 2 N/A N/A Post Procedural Pain: Procedure was tolerated well N/A N/A Debridement Treatment Response: 4.5x2.1x0.1 N/A N/A Post Debridement Measurements L x W x D (cm) 0.742 N/A N/A Post Debridement Volume: (cm) Debridement N/A N/A Procedures Performed: Treatment Notes Electronic Signature(s) Signed: 03/07/2022  9:04:05 AM By: Fredirick Maudlin MD FACS Signed: 03/07/2022 4:29:40 PM By: Adline Peals Entered By: Fredirick Maudlin on 03/07/2022 09:04:05 -------------------------------------------------------------------------------- Multi-Disciplinary Care Plan Details Patient Name: Date of Service: Anthony Barnes, [name] 03/07/2022 8:30 A M Medical Record Number: [id number] Patient Account Number: [id number] Date of Birth/Sex: Treating RN: [birth year] (66 y.o. Anthony Barnes Primary Care Keaundre Thelin: Charlott Rakes Other Clinician: Referring Mitsuo Budnick: Treating Philomina Leon/Extender: Carter Kitten Weeks in Treatment: 9 Active Inactive Venous Leg Ulcer Nursing Diagnoses: Actual venous Insuffiency (use after diagnosis is confirmed) Knowledge deficit related to disease process and management Goals: Patient will maintain optimal edema control Date Initiated: 01/02/2022 Target Resolution Date: 04/14/2022 Goal Status: Active Patient/caregiver will verbalize understanding of disease process and disease management Date Initiated: 01/02/2022 Date Inactivated: 03/07/2022 Target Resolution Date: 03/09/2022 Goal Status: Met Interventions: Assess peripheral edema status every visit. Compression as ordered Provide education on venous insufficiency Treatment Activities: Non-invasive vascular studies : 01/02/2022 T ordered outside of clinic : 01/02/2022 est Therapeutic compression applied : 01/02/2022 Notes: Wound/Skin Impairment Nursing Diagnoses: Impaired tissue integrity Knowledge deficit related to ulceration/compromised skin integrity Goals: Patient/caregiver will verbalize understanding of skin care regimen Date Initiated: 01/02/2022 Date Inactivated: 02/02/2022 Target Resolution Date: 02/03/2022 Goal Status: Met Ulcer/skin breakdown will have a volume reduction of 30% by week 4 Date Initiated: 01/02/2022 Target Resolution Date: 04/14/2022 Goal Status:  Active Interventions: Assess ulceration(s) every visit Provide education on ulcer and skin care Treatment Activities: Skin care regimen initiated : 01/02/2022 Topical wound management initiated : 01/02/2022 Notes: Electronic Signature(s) Signed: 03/07/2022 4:29:40 PM By: Adline Peals Entered By: Adline Peals on 03/07/2022 08:48:17 -------------------------------------------------------------------------------- Pain Assessment Details Patient Name: Date of Service: Anthony Barnes, [name] 03/07/2022 8:30 A M Medical Record Number: [id number] Patient Account Number: [id number] Date of Birth/Sex: Treating RN: [birth year] (66 y.o. Anthony Barnes Primary Care Joeanne Robicheaux: Charlott Rakes Other Clinician: Referring Diavian Furgason: Treating Malaiyah Achorn/Extender: Carter Kitten Weeks in Treatment: 9 Active Problems Location of Pain Severity and Description of Pain Patient Has Paino Yes Site Locations Duration of the Pain. Duration of the Pain. Constant / Intermittento Constant Rate the pain. Current Pain Level: 4 Character of Pain Describe the Pain: Other: neuropathy Pain Management and Medication Current Pain Management: Medication: Yes How does your wound impact your activities of daily livingo Sleep: Yes Electronic Signature(s) Signed: 03/07/2022 4:29:40 PM By: Adline Peals Entered By: Adline Peals on 03/07/2022 08:43:09 -------------------------------------------------------------------------------- Patient/Caregiver Education Details Patient Name: Date of Service: Anthony Barnes,  [name] 7/25/2023andnbsp8:30 A M Medical Record Number: [id number] Patient Account Number: [id number] Date of Birth/Gender: Treating RN: 12-27-1955 (66 y.o. Anthony Barnes Primary Care Physician: Charlott Rakes Other Clinician: Referring Physician: Treating Physician/Extender: Carter Kitten Weeks in Treatment: 9 Education  Assessment Education Provided To: Patient Education Topics Provided Wound/Skin Impairment: Methods: Explain/Verbal Responses: Reinforcements needed, State content correctly Electronic Signature(s) Signed: 03/07/2022 4:29:40 PM By: Adline Peals Entered By: Adline Peals on 03/07/2022 08:48:29 -------------------------------------------------------------------------------- Wound Assessment Details Patient Name: Date of Service: Anthony Barnes, [name] 03/07/2022 8:30 A M Medical Record Number: [id number] Patient Account Number: [id number] Date of Birth/Sex: Treating RN: 1955/11/16 (66 y.o. Anthony Barnes Primary Care Vermon Grays: Charlott Rakes Other Clinician: Referring Lynzie Cliburn: Treating Jamese Trauger/Extender: Carter Kitten Weeks in Treatment: 9 Wound Status Wound Number: 2 Primary Etiology: Venous Leg Ulcer Wound Location: Right, Lateral Lower Leg Wound Status: Open Wounding Event: Trauma Comorbid History: Hypertension, Type II Diabetes Date Acquired: 12/03/2021 Weeks Of Treatment: 9 Clustered Wound: No Photos Wound Measurements Length: (cm) 4.5 Width: (cm) 2.1 Depth: (cm) 0.1 Area: (cm) 7.422 Volume: (cm) 0.742 % Reduction in Area: -600.2% % Reduction in Volume: -600% Epithelialization: Small (1-33%) Tunneling: No Undermining: No Wound Description Classification: Full Thickness Without Exposed Support Structures Wound Margin: Distinct, outline attached Exudate Amount: Medium Exudate Type: Serosanguineous Exudate Color: red, brown Foul Odor After Cleansing: No Slough/Fibrino Yes Wound Bed Granulation Amount: Large (67-100%) Exposed Structure Granulation Quality: Red Fascia Exposed: No Necrotic Amount: Small (1-33%) Fat Layer (Subcutaneous Tissue) Exposed: Yes Necrotic Quality: Adherent Slough Tendon Exposed: No Muscle Exposed: No Joint Exposed: No Bone Exposed: No Treatment Notes Wound #2 (Lower Leg) Wound Laterality:  Right, Lateral Cleanser Soap and Water Discharge Instruction: May shower and wash wound with dial antibacterial soap and water prior to dressing change. Wound Cleanser Discharge Instruction: Cleanse the wound with wound cleanser prior to applying a clean dressing using gauze sponges, not tissue or cotton balls. Peri-Wound Care Sween Lotion (Moisturizing lotion) Discharge Instruction: Apply moisturizing lotion as directed Topical Primary Dressing Promogran Prisma Matrix, 4.34 (sq in) (silver collagen) Discharge Instruction: Moisten collagen with saline or hydrogel Secondary Dressing Woven Gauze Sponge, Non-Sterile 4x4 in Discharge Instruction: Apply over primary dressing as directed. Secured With Elastic Bandage 4 inch (ACE bandage) Discharge Instruction: Secure with ACE bandage as directed. Kerlix Roll Sterile, 4.5x3.1 (in/yd) Discharge Instruction: Secure with Kerlix as directed. 58M Medipore H Soft Cloth Surgical T ape, 4 x 10 (in/yd) Discharge Instruction: Secure with tape as directed. Compression Wrap Compression Stockings Add-Ons Electronic Signature(s) Signed: 03/07/2022 4:29:40 PM By: Adline Peals Entered By: Adline Peals on 03/07/2022 08:49:47 -------------------------------------------------------------------------------- Vitals Details Patient Name: Date of Service: Anthony Barnes, [name] 03/07/2022 8:30 A M Medical Record Number: [id number] Patient Account Number: [id number] Date of Birth/Sex: Treating RN: [birth year] (66 y.o. Anthony Barnes Primary Care Daphane Odekirk: Charlott Rakes Other Clinician: Referring Zackery Brine: Treating Jeryn Bertoni/Extender: Carter Kitten Weeks in Treatment: 9 Vital Signs Time Taken: 08:41 Temperature (F): 98.7 Height (in): 75 Pulse (bpm): 83 Weight (lbs): 310 Respiratory Rate (breaths/min): 18 Body Mass Index (BMI): 38.7 Blood Pressure (mmHg): 162/94 Reference Range: 80 - 120 mg / dl Electronic  Signature(s) Signed: 03/07/2022 4:29:40 PM By: Adline Peals Entered By: Adline Peals on 03/07/2022 08:41:51

## 2022-03-08 ENCOUNTER — Encounter (HOSPITAL_BASED_OUTPATIENT_CLINIC_OR_DEPARTMENT_OTHER): Payer: Medicare Other

## 2022-03-09 ENCOUNTER — Encounter (HOSPITAL_BASED_OUTPATIENT_CLINIC_OR_DEPARTMENT_OTHER): Payer: Medicare Other | Admitting: General Surgery

## 2022-03-09 DIAGNOSIS — L97812 Non-pressure chronic ulcer of other part of right lower leg with fat layer exposed: Secondary | ICD-10-CM | POA: Diagnosis not present

## 2022-03-09 DIAGNOSIS — I872 Venous insufficiency (chronic) (peripheral): Secondary | ICD-10-CM | POA: Diagnosis not present

## 2022-03-09 DIAGNOSIS — I10 Essential (primary) hypertension: Secondary | ICD-10-CM | POA: Diagnosis not present

## 2022-03-09 DIAGNOSIS — E119 Type 2 diabetes mellitus without complications: Secondary | ICD-10-CM | POA: Diagnosis not present

## 2022-03-09 DIAGNOSIS — E11622 Type 2 diabetes mellitus with other skin ulcer: Secondary | ICD-10-CM | POA: Diagnosis not present

## 2022-03-09 NOTE — Progress Notes (Addendum)
WARD, BOISSONNEAULT ([id number]) Visit Report for 03/09/2022 Arrival Information Details Patient Name: Date of Service: Anthony Barnes 03/09/2022 12:30 PM Medical Record Number: [id number] Patient Account Number: [id number] Date of Birth/Sex: Treating RN: November 20, 1955 (66 y.o. M) Primary Care Ruhi Kopke: Hoy Register Other Clinician: Referring Clelia Trabucco: Treating Simuel Stebner/Extender: Camillo Flaming Weeks in Treatment: 9 Visit Information History Since Last Visit All ordered tests and consults were completed: Yes Patient Arrived: Ambulatory Added or deleted any medications: No Arrival Time: 12:49 Any new allergies or adverse reactions: No Accompanied By: self Had a fall or experienced change in No Transfer Assistance: None activities of daily living that may affect Patient Identification Verified: Yes risk of falls: Patient Requires Transmission-Based Precautions: No Signs or symptoms of abuse/neglect since last visito No Hospitalized since last visit: No Implantable device outside of the clinic excluding No cellular tissue based products placed in the center since last visit: Has Dressing in Place as Prescribed: No Has Compression in Place as Prescribed: No Pain Present Now: Yes Electronic Signature(s) Signed: 03/09/2022 1:18:46 PM By: Tommie Ard RN Entered By: Tommie Ard on 03/09/2022 12:50:39 -------------------------------------------------------------------------------- Clinic Level of Care Assessment Details Patient Name: Date of Service: Anthony Barnes [name] 03/09/2022 12:30 PM Medical Record Number: [id number] Patient Account Number: [id number] Date of Birth/Sex: Treating RN: [birth year] (66 y.o. [name] Primary Care Freyja Govea: Hoy Register Other Clinician: Referring Alisha Burgo: Treating Arie Gable/Extender: Camillo Flaming Weeks in Treatment: 9 Clinic Level of Care Assessment Items TOOL 4 Quantity Score []  - 0 Use  when only an EandM is performed on FOLLOW-UP visit ASSESSMENTS - Nursing Assessment / Reassessment X- 1 10 Reassessment of Co-morbidities (includes updates in patient status) X- 1 5 Reassessment of Adherence to Treatment Plan ASSESSMENTS - Wound and Skin A ssessment / Reassessment X - Simple Wound Assessment / Reassessment - one wound 1 5 []  - 0 Complex Wound Assessment / Reassessment - multiple wounds []  - 0 Dermatologic / Skin Assessment (not related to wound area) ASSESSMENTS - Focused Assessment X- 1 5 Circumferential Edema Measurements - multi extremities []  - 0 Nutritional Assessment / Counseling / Intervention []  - 0 Lower Extremity Assessment (monofilament, tuning fork, pulses) []  - 0 Peripheral Arterial Disease Assessment (using hand held doppler) ASSESSMENTS - Ostomy and/or Continence Assessment and Care []  - 0 Incontinence Assessment and Management []  - 0 Ostomy Care Assessment and Management (repouching, etc.) PROCESS - Coordination of Care X - Simple Patient / Family Education for ongoing care 1 15 []  - 0 Complex (extensive) Patient / Family Education for ongoing care X- 1 10 Staff obtains , Records, T Results / Process Orders est []  - 0 Staff telephones HHA, Nursing Homes / Clarify orders / etc []  - 0 Routine Transfer to another Facility (non-emergent condition) []  - 0 Routine Hospital Admission (non-emergent condition) []  - 0 New Admissions / / Ordering NPWT Apligraf, etc. , []  - 0 Emergency Hospital Admission (emergent condition) X- 1 10 Simple Discharge Coordination []  - 0 Complex (extensive) Discharge Coordination PROCESS - Special Needs []  - 0 Pediatric / Minor Patient Management []  - 0 Isolation Patient Management []  - 0 Hearing / Language / Visual special needs []  - 0 Assessment of Community assistance (transportation, D/C planning, etc.) []  - 0 Additional assistance / Altered mentation []  - 0 Support  Surface(s) Assessment (bed, cushion, seat, etc.) INTERVENTIONS - Wound Cleansing / Measurement X - Simple Wound Cleansing - one wound 1 5 []  - 0 Complex  Wound Cleansing - multiple wounds []  - 0 Wound Imaging (photographs - any number of wounds) []  - 0 Wound Tracing (instead of photographs) []  - 0 Simple Wound Measurement - one wound []  - 0 Complex Wound Measurement - multiple wounds INTERVENTIONS - Wound Dressings []  - 0 Small Wound Dressing one or multiple wounds X- 1 15 Medium Wound Dressing one or multiple wounds []  - 0 Large Wound Dressing one or multiple wounds []  - 0 Application of Medications - topical []  - 0 Application of Medications - injection INTERVENTIONS - Miscellaneous []  - 0 External ear exam []  - 0 Specimen Collection (cultures, biopsies, blood, body fluids, etc.) []  - 0 Specimen(s) / Culture(s) sent or taken to Lab for analysis []  - 0 Patient Transfer (multiple staff / / Similar devices) []  - 0 Simple Staple / Suture removal (25 or less) []  - 0 Complex Staple / Suture removal (26 or more) []  - 0 Hypo / Hyperglycemic Management (close monitor of Blood Glucose) []  - 0 Ankle / Brachial Index (ABI) - do not check if billed separately X- 1 5 Vital Signs Has the patient been seen at the hospital within the last three years: Yes Total Score: 85 Level Of Care: New/Established - Level 3 Electronic Signature(s) Signed: 03/09/2022 6:12:07 PM By: RN, BSN Entered By: on 03/09/2022 16:05:56 -------------------------------------------------------------------------------- Encounter Discharge Information Details Patient Name: Date of Service: , Anthony [name] 03/09/2022 12:30 PM Medical Record Number: Patient Account Number: Date of Birth/Sex: Treating RN: Dec 01, 1955 (66 y.o. M) Primary Care Anthony Barnes: Other Clinician: Referring Tahani Potier: Treating Inis Borneman/Extender: Nurse, adult Weeks in Treatment: 9 Encounter Discharge Information Items Discharge Condition: Stable Ambulatory Status: Ambulatory Discharge Destination: Home Transportation: Private Auto Accompanied By: self Schedule Follow-up Appointment: Yes Clinical Summary of Care: Electronic Signature(s) Signed: 03/09/2022 1:18:46 PM By: RN Entered By: on 03/09/2022 13:14:51 -------------------------------------------------------------------------------- Patient/Caregiver Education Details Patient Name: Date of Service: 03/11/2022 7/27/2023andnbsp12:30 PM Medical Record Number: Anthony Barnes Patient Account Number: 03/11/2022 Date of Birth/Gender: Treating RN: [birth year] (66 y.o. M) Primary Care Physician: [id number] Other Clinician: Referring Physician: Treating Physician/Extender: [id number] Weeks in Treatment: 9 Education Assessment Education Provided To: Patient Education Topics Provided Venous: Methods: Explain/Verbal Responses: Reinforcements needed, State content correctly Wound/Skin Impairment: Methods: Explain/Verbal Responses: Reinforcements needed, State content correctly Electronic Signature(s) Signed: 03/09/2022 1:18:46 PM By: 77 RN Entered By: Hoy Register on 03/09/2022 13:14:32 -------------------------------------------------------------------------------- Wound Assessment Details Patient Name: Date of Service: 03/11/2022 [name] 03/09/2022 12:30 PM Medical Record Number: Tommie Ard Patient Account Number: 03/11/2022 Date of Birth/Sex: Treating RN: [birth year] (66 y.o. M) Primary Care Cinderella Christoffersen: [id number] Other Clinician: Referring Laury Huizar: Treating Julieanne Hadsall/Extender: [id number] Weeks in Treatment: 9 Wound Status Wound Number: 2 Primary Etiology: Venous Leg Ulcer Wound Location: Right, Lateral Lower Leg Wound Status: Open Wounding Event:  Trauma Comorbid History: Hypertension, Type II Diabetes Date Acquired: 12/03/2021 Weeks Of Treatment: 9 Clustered Wound: No Wound Measurements Length: (cm) 4.5 Width: (cm) 2.1 Depth: (cm) 0.1 Area: (cm) 7.422 Volume: (cm) 0.742 % Reduction in Area: -600.2% % Reduction in Volume: -600% Epithelialization: Small (1-33%) Tunneling: No Undermining: No Wound Description Classification: Full Thickness Without Exposed Support Structures Wound Margin: Distinct, outline attached Exudate Amount: Medium Exudate Type: Serosanguineous Exudate Color: red, brown Foul Odor After Cleansing: No Slough/Fibrino Yes Wound Bed Granulation Amount: Large (67-100%) Exposed Structure Granulation Quality: Red Fascia Exposed:  No Necrotic Amount: Small (1-33%) Fat Layer (Subcutaneous Tissue) Exposed: Yes Necrotic Quality: Adherent Slough Tendon Exposed: No Muscle Exposed: No Joint Exposed: No Bone Exposed: No Treatment Notes Wound #2 (Lower Leg) Wound Laterality: Right, Lateral Cleanser Soap and Water Discharge Instruction: May shower and wash wound with dial antibacterial soap and water prior to dressing change. Wound Cleanser Discharge Instruction: Cleanse the wound with wound cleanser prior to applying a clean dressing using gauze sponges, not tissue or cotton balls. Peri-Wound Care Sween Lotion (Moisturizing lotion) Discharge Instruction: Apply moisturizing lotion as directed Topical Primary Dressing Hydrofera Blue Ready Foam, 2.5 x2.5 in Discharge Instruction: Apply to wound bed as instructed Secondary Dressing Woven Gauze Sponge, Non-Sterile 4x4 in Discharge Instruction: Apply over primary dressing as directed. Secured With Elastic Bandage 4 inch (ACE bandage) Discharge Instruction: Secure with ACE bandage as directed. Kerlix Roll Sterile, 4.5x3.1 (in/yd) Discharge Instruction: Secure with Kerlix as directed. 105M Medipore H Soft Cloth Surgical T ape, 4 x 10 (in/yd) Discharge  Instruction: Secure with tape as directed. Compression Wrap Compression Stockings Add-Ons Electronic Signature(s) Signed: 03/09/2022 1:18:46 PM By: Tommie Ard RN Entered By: Tommie Ard on 03/09/2022 12:57:16 -------------------------------------------------------------------------------- Vitals Details Patient Name: Date of Service: [name], Anthony [name] 03/09/2022 12:30 PM Medical Record Number: [id number] Patient Account Number: [id number] Date of Birth/Sex: Treating RN: [birth year] (66 y.o. M) Primary Care Halen Mossbarger: Hoy Register Other Clinician: Referring Ardis Lawley: Treating Sherilee Smotherman/Extender: Camillo Flaming Weeks in Treatment: 9 Vital Signs Time Taken: 13:10 Temperature (F): 98.2 Height (in): 75 Pulse (bpm): 65 Weight (lbs): 310 Respiratory Rate (breaths/min): 18 Body Mass Index (BMI): 38.7 Blood Pressure (mmHg): 123/83 Capillary Blood Glucose (mg/dl): 588 Reference Range: 80 - 120 mg / dl Electronic Signature(s) Signed: 03/09/2022 1:18:46 PM By: Tommie Ard RN Entered By: Tommie Ard on 03/09/2022 13:12:40

## 2022-03-10 NOTE — Progress Notes (Signed)
ZAHID, CARNEIRO ([id number]) Visit Report for 03/09/2022 SuperBill Details Patient Name: Date of Service: Anthony Barnes 03/09/2022 Medical Record Number: [id number] Patient Account Number: [id number] Date of Birth/Sex: Treating RN: July 21, 1956 (66 y.o. [name] Primary Care Provider: Hoy Register Other Clinician: Referring Provider: Treating Provider/Extender: Camillo Flaming Weeks in Treatment: 9 Diagnosis Coding ICD-10 Codes Code Description [phone] Non-pressure chronic ulcer of other part of right lower leg with fat layer exposed E11.622 Type 2 diabetes mellitus with other skin ulcer E11.9 Type 2 diabetes mellitus without complications I10 Essential (primary) hypertension I87.2 Venous insufficiency (chronic) (peripheral) Facility Procedures CPT4 Code Description Modifier Quantity [id number] - WOUND CARE VISIT-LEV 3 EST PT 1 Electronic Signature(s) Signed: 03/09/2022 6:12:07 PM By: Zenaida Deed RN, BSN Signed: 03/10/2022 10:35:08 AM By: Duanne Guess MD FACS Entered By: Zenaida Deed on 03/09/2022 16:06:31

## 2022-03-13 ENCOUNTER — Encounter (HOSPITAL_BASED_OUTPATIENT_CLINIC_OR_DEPARTMENT_OTHER): Payer: Medicare Other | Admitting: General Surgery

## 2022-03-14 ENCOUNTER — Encounter (HOSPITAL_BASED_OUTPATIENT_CLINIC_OR_DEPARTMENT_OTHER): Payer: Medicare Other | Attending: General Surgery | Admitting: General Surgery

## 2022-03-14 DIAGNOSIS — E11622 Type 2 diabetes mellitus with other skin ulcer: Secondary | ICD-10-CM | POA: Diagnosis not present

## 2022-03-14 DIAGNOSIS — L97812 Non-pressure chronic ulcer of other part of right lower leg with fat layer exposed: Secondary | ICD-10-CM | POA: Diagnosis not present

## 2022-03-14 DIAGNOSIS — I872 Venous insufficiency (chronic) (peripheral): Secondary | ICD-10-CM | POA: Insufficient documentation

## 2022-03-14 DIAGNOSIS — Z6838 Body mass index (BMI) 38.0-38.9, adult: Secondary | ICD-10-CM | POA: Diagnosis not present

## 2022-03-14 DIAGNOSIS — I10 Essential (primary) hypertension: Secondary | ICD-10-CM | POA: Diagnosis not present

## 2022-03-14 NOTE — Progress Notes (Signed)
Anthony Barnes, Anthony E. ([id number]) Visit Report for 03/14/2022 Chief Complaint Document Details Patient Name: Date of Service: Anthony Barnes, [name] 03/14/2022 10:45 A M Medical Record Number: [id number] Patient Account Number: [id number] Date of Birth/Sex: Treating RN: 09-27-1955 (66 y.o. M) Primary Care Provider: Hoy RegisterNewlin, Enobong Other Clinician: Referring Provider: Treating Provider/Extender: Camillo Flamingannon, Ridge Lafond Newlin, Enobong Weeks in Treatment: 10 Information Obtained from: Patient Chief Complaint Right LE Ulcer Electronic Signature(s) Signed: 03/14/2022 11:15:42 AM By: Duanne Guessannon, Mirko Tailor MD FACS Entered By: Duanne Guessannon, Eyonna Sandstrom on 03/14/2022 11:15:42 -------------------------------------------------------------------------------- Debridement Details Patient Name: Date of Service: Anthony Barnes, [name] 03/14/2022 10:45 A M Medical Record Number: [id number] Patient Account Number: [id number] Date of Birth/Sex: Treating RN: 09-27-1955 (66 y.o. M) Primary Care Provider: Hoy RegisterNewlin, Enobong Other Clinician: Referring Provider: Treating Provider/Extender: Camillo Flamingannon, Jalayah Gutridge Newlin, Enobong Weeks in Treatment: 10 Debridement Performed for Assessment: Wound #2 Right,Lateral Lower Leg Performed By: Physician Duanne Guessannon, Shahzaib Azevedo, MD Debridement Type: Debridement Severity of Tissue Pre Debridement: Fat layer exposed Level of Consciousness (Pre-procedure): Awake and Alert Pre-procedure Verification/Time Out Yes - 11:09 Taken: Start Time: 11:09 Pain Control: Lidocaine 5% topical ointment T Area Debrided (L x W): otal 4.1 (cm) x 1.5 (cm) = 6.15 (cm) Tissue and other material debrided: Non-Viable, Eschar, Slough, Slough Level: Non-Viable Tissue Debridement Description: Selective/Open Wound Instrument: Curette Bleeding: Minimum Hemostasis Achieved: Pressure Procedural Pain: 0 Post Procedural Pain: 0 Response to Treatment: Procedure was tolerated well Level of Consciousness (Post- Awake and  Alert procedure): Post Debridement Measurements of Total Wound Length: (cm) 4.1 Width: (cm) 1.5 Depth: (cm) 0.1 Volume: (cm) 0.483 Character of Wound/Ulcer Post Debridement: Improved Severity of Tissue Post Debridement: Fat layer exposed Post Procedure Diagnosis Same as Pre-procedure Electronic Signature(s) Signed: 03/14/2022 11:36:06 AM By: Duanne Guessannon, Coda Filler MD FACS Signed: 03/14/2022 4:07:51 PM By: Tommie ArdZochol, Jamie RN Entered By: Tommie ArdZochol, Jamie on 03/14/2022 11:12:07 -------------------------------------------------------------------------------- HPI Details Patient Name: Date of Service: Anthony Barnes, [name] 03/14/2022 10:45 A M Medical Record Number: [id number] Patient Account Number: [id number] Date of Birth/Sex: Treating RN: 09-27-1955 (66 y.o. M) Primary Care Provider: Hoy RegisterNewlin, Enobong Other Clinician: Referring Provider: Treating Provider/Extender: Camillo Flamingannon, Minervia Osso Newlin, Enobong Weeks in Treatment: 10 History of Present Illness HPI Description: ADMISSION 03/12/18 This is a 66 year old and it was a type II diabetic reasonably poorly controlled with a recent hemoglobin A1c of 8.2. He was tending to his lawn on 01/15/18 when his weed [name] sent a rock up word hitting him in the right anterior leg. He was seen in his primary M.D. office is same time. An x-ray showed no abnormality. He was given a course of cephalexin. Since then he's been applying peroxide and topical antibiotics to the wound. He does not have a history of chronic wounds however he does have chronic edema in the lower legs. He is complaining of pain in the right leg wound but no real history of claudication. Past medical history includes type 2 diabetes, hypertension, morbid obesity. ABIs in the right leg were noncompressible 03/19/18 on evaluation today patient appears to be tolerating the wrap in general fairly well. He states he's not having that much pain in regard to the wound itself. With that being said he is having  some issues with slough buildup on the surface of the wound the Iodoflex does seem to be helpful in that regard. He is still having discomfort he wonders if I can send in a prescription for ibuprofen or something of like to help him out. I do believe that the prox end may be  of benefit for him. 03/25/18 on evaluation today patient appears to be doing very well in regard to his right lateral lower [name]. He's been tolerating the dressing changes without complication that is the wraps. Nonetheless he does continue to have pain he states is been dreading the possibility of having to have debridement again today. His first debridement experience was not optimal. With that being said he does seem to be showing signs of improvement there does appear to be little bit more granulation he does have a lot of slough that really does need to breeding away. 04/04/18;the patient went for arterial studies that were really quite normal. ABI on the right at 1.19 with triphasic waveforms on the left 1.11 with triphasic waveforms. TBI 0.93 on the right and 0.95 on the left. This suggests T should be able to tolerate even 4 layer compression. He is however complaining of a lot of pain with our wraps I'm wondering whether the pain is simply Iodoflex. I changed him to [employer] I'm still going to try to keep him in 3 layer compression 04/12/18 on evaluation today patient actually appears to be doing rather well in regard to his right lower [name] ulcer. He is making good progress although it is slow still I do believe that the Santyl is much better than the Iodoflex. The good news is the patient seems to be tolerating the dressing changes without complication now that we've gotten good of the Iodoflex which really did burn him quite significantly. Otherwise there's no evidence of infection. 04/17/18 on evaluation today patient actually appears to be showing signs of improvement in  regard to the ulcer. Unfortunately he's had somewhat of a rough day due to the fact that he found out that one of his friends suddenly and unexpectedly passed today. He states he's not sure he's in the frame of mind to allow me to debride the wound at this point. Nonetheless I do feel like he is showing signs of the wound getting better little by little each week. 04/24/18 on evaluation today patient's ulcer actually appears to be showing some signs of improvement albeit slow. He has been tolerating the dressing changes without complication except for the wrap which she states was so tight that he had to remove it it was causing a lot of discomfort. Fortunately there is no evidence of infection at this point. He states he only wore the wrap until about the time he got home last week and that he had to take it off. Nonetheless he does not have any other compression stockings, Juxta-Lite wraps, or anything otherwise to really help at this point as far as compression is concerned. 05/01/18 on evaluation today patient actually appears to be doing fairly well in regard to his lower extremity ulcer. He has been tolerating the dressing changes currently without complication. The wrap does seem to be helping as far as the fluid management is concerned. Wound bed itself actually show signs of good improvement although there is some Slough noted there's not as much as previous and he actually has fairly decent granulation noted as well. Overall I'm pleased with the progress of to this point. The patient would prefer not to have any debridement today he states he's actually not feeling too well in general and he really does not want any additional discomfort typically debridement is fairly uncomfortable for him. 05/08/18 on evaluation today patient actually appears to be doing a little better in regard to  his lower extremity ulcer. He has been tolerating the dressing changes without complication. With that being said  I'm actually quite pleased with the fact the wound is not appear to be infected and in general has made a little progress. With that being said I do believe we need to perform some debridement to clean away the necrotic tissue on the surface of the wound. 05/15/18 on evaluation today patient actually appears to be doing a little better in regard to his wound. Fortunately there is some improvement noted fortunately there's also no significant evidence of infection at this point in time. He does have continued pain that really nothing different than previous he did get his Juxta-Lite wrap. 05/29/18 on evaluation today patient actually appears to be doing somewhat better in regard to his wound currently. He's been tolerating the dressing changes without complication. With that being said he has been performing the [facility] Dressing changes every day at home. I'm pretty sure that we told him every other day last week or when I saw him rather two weeks ago. With that being said obviously did not hurt to do it daily just that obviously is gonna run out of supplies sooner and that could get him into trouble as far as his insurance is concerned. Nonetheless he at this point still continues to have pain although honestly I don't feel like it's as bad as what it was in the past just based on his reactions at this point. 06/12/18 on evaluation today patient actually appears to be doing better in regard to his lower [name] ulcer. He's been tolerating the dressing changes without complication. Fortunately there does not appear to be any evidence of infection at this time. No fevers, chills, nausea, or vomiting noted at this time. 06/19/18 evaluation today patient's ulcer on the lower extremity appears to be doing better at this point. he has been tolerating the dressing changes. The patient seems to be somewhat depressed about the progress of his wound although the overall appearance at this time seems to be  doing well. In general I'm very pleased with the appearance today he does have some biofilm on the surface of the wound as well as of hyper granular tissue we may want to utilize a little bit of silver nitrate today. 06/27/2018; patient comes into clinic today for a wound on the right lateral calf likely related to chronic venous insufficiency. He has been using medihoney covered with Hydrofera Blue. Wound surface actually looks quite good 07/03/2018 seen today for follow-up and management of lower extremity ulcer right lateral shin. T olerating dressing changes. He has a small amount of biofilm today. Will attempt to remove a portion of the biofilm as he tolerates. He becomes very anxious with wound treatments. He would benefit from layer compression wraps however he refuses compression wraps or anything that smokes his legs. He expressed an inability to tolerate compression wraps. Juxta lite wraps have been ordered. He has been instructed on the appropriate application of the juxta leg wraps. His blood pressure today on visit was 200/120. He denies any blurred vision, chest pain, dizziness, shortness of breath, or difficulty with mobility. Mr. Antillon stated that he had a recent visit with his primary care provider. At that time his carvedilol was decreased from 25 mg daily to 12.5 mg daily. Strongly encouraged Mr. Hustead to seek medical attention today during visit. He declined transport for evaluation of elevated blood pressure. Encouraged him to contact his primary care provider today for  medication management. He stated that he would call his primary care doctor after wound visit today. Also encouraged him that if he experienced any symptoms of blurred vision, chest pain, shortness of breath to immediately seek medical attention. 07/17/18 on evaluation today patient's wound actually does seem to show signs of improvement based on the overall appearance of the wound bed today. There does not  appear to show any signs of infection which is good news. There is no overall worsening which is also good news. He still has hyper granular tissue will use in the [employer] followed by [facility] Dressing this point. 07/31/18 on evaluation today patient's wound bed actually show signs of improvement with good epithelialization especially in the upper portion of the wound. He has been tolerating the dressing changes without complication in general. Overall I'm extremely happy with how things stand. No fevers, chills, nausea, or vomiting noted at this time. 08/28/18 on evaluation today patient appears to be doing a little better in regard to his lower extremity ulcer. With that being said it appears to be very hyper granular I think this is directly attributed to the fact that he continues to use the [employer] underneath the [facility] Dressing. Although this helps not to stick unfortunately also think he's not getting the benefit of the [facility] Dressing particular and subsequently is causing too much moisture buildup hence the hyper granulation. Fortunately there does not appear to be any signs of infection at this time which is good news. 09/03/18 on evaluation today patient appears to be doing well in regard to his lower [name] ulcer. He has been tolerating the dressing changes without complication. Fortunately he has much less hyper granular tissue the noted last week I do think using silver nitrate in changing to using the [facility] Dressing without the mepitel has made a big difference for him this is good news. 09/18/18 on evaluation today patient appears to be doing excellent in regard to his right lower [name] ulcer. This is actually significantly smaller even compared to the last evaluation. Overall I'm very pleased in this regard. I'm a recommend that we likely repeat the silver nitrate today based on what I'm seeing. 10/02/18 on evaluation today patient appears to be doing  excellent in regard to his lower extremity wound on the right. He's been tolerating the dressing changes without complication. Fortunately there's no signs of infection at this time. Overall been very pleased with how things seem to be progressing. No fevers, chills, nausea, or vomiting noted at this time. 10/16/18 on evaluation today patient actually appears to be doing much better in regard to his lower extremity wound on the right. This is shown signs of good improvement and is indeed measuring smaller he is on a excellent track as far as healing is concerned. My hope is this will be healed of the next several weeks. Fortunately there is no evidence of infection at this time. He does seem to be doing everything that I'm recommending for him 10/30/18 on evaluation today patient appears to be doing better in regard to his lower extremity ulcer. He's been tolerating the dressing changes without complication. Fortunately the Hydrofera Blue Dressing to be doing the job. He has made excellent progress. With that being said this is still going somewhat slow but nonetheless is always making good progress at this point. 5/18-Patient was in to be seen for right leg ulcer that appeared after scab above it was removed today. This area had healed completely. It  appears that no further action is required on this READMISSION 01/02/2022 This is a now 66 year old male with poorly controlled type 2 diabetes mellitus and chronic venous insufficiency who once again was performing lawn care when a rock was flung up by his weedeater and it struck him in the right lateral lower leg. The wound has not healed though it has been nearly 2 months since the injury occurred. He was seen in the emergency department at [facility] on May 9. At that time it was very painful and he described it as a "15 on a scale of 010". He also was found to have 3+ pitting edema to the bilateral lower extremities. He was prescribed a weeks course of  Keflex. He is here today because the wound has failed to heal and he has a prior history in our clinic. ABI in clinic today was noncompressible. He did have formal vascular studies performed in 2019 which were normal. The last hemoglobin A1c I have available for review was from March 21, 2021 and was elevated at 7.7%. The wound is fairly small and circular located about 3 inches above his right lateral malleolus. It is completely covered with eschar. No surrounding erythema, no odor, no purulent drainage. 01/11/2022: The wound on his right lateral leg is a little bit smaller today. It has reaccumulated some slough and a small bit of eschar. It remains fairly painful. He has another area on his anterior tibia that he will not let me touch but I am concerned that there is a wound forming underneath the surface. 01/18/2022: Unfortunately, his wound is a little bit bigger today. He continues to accumulate slough on the wound surface. It is still quite tender. 01/25/2022: The patient bumped his wound on the car door which resulted in bleeding. He was seen at urgent care where it was redressed. Unfortunately, the wound is bigger again today. There is accumulated slough on the surface. Urgent care gave him some tramadol, apparently and he took this prior to his visit so he could tolerate debridement better. 02/02/2022: The wound is a little bit smaller today. The surface, however, has accumulated a fairly thick layer of slough. The periwound skin is intact and there is no obvious sign of infection. 02/07/2022: The wound is a bit larger today. It has thick slough accumulation. The periwound skin is intact and there is no obvious erythema, induration, nor any odor. 02/16/2022: I took a culture last week due to the appearance of the wound and the patient's degree of pain. This grew out methicillin sensitive Staph aureus. Augmentin was prescribed. The patient states that the pain is markedly improved today. The wound  also looks much better. It is smaller with less slough buildup and there is good granulation tissue emerging. 02/21/2022: The patient's pain is quite improved from prior visits. The wound is about the same size and has some accumulated slough on the surface. The base is a little bit fibrotic but there are some areas of granulation tissue filling in. 02/28/2022: The wound is a little bit narrower on its measurements but slightly longer. There is still some slough accumulation on the surface, but he has minimal pain and there is no concern for ongoing infection. Granulation tissue is emerging. 03/07/2022: During the week, he cut holes in his wrap because it was painful and too tight. As a result, his leg was quite a bit more swollen today and his wound was larger. The wound surface is fairly clean but a little bit  dry. Minimal slough accumulation. Granulation tissue present. 03/14/2022: Once again, he did some arts and crafts projects with his wrap. On the other hand, however, his wound does look better. It is smaller today and is beginning to fill in. Minimal slough and a small amount of eschar accumulation. Electronic Signature(s) Signed: 03/14/2022 11:16:35 AM By: Duanne Guess MD FACS Entered By: Duanne Guess on 03/14/2022 11:16:35 -------------------------------------------------------------------------------- Physical Exam Details Patient Name: Date of Service: Anthony Barnes, [name] 03/14/2022 10:45 A M Medical Record Number: [id number] Patient Account Number: [id number] Date of Birth/Sex: Treating RN: 25-Apr-1956 (66 y.o. M) Primary Care Provider: Hoy Register Other Clinician: Referring Provider: Treating Provider/Extender: Camillo Flaming Weeks in Treatment: 10 Constitutional Hypertensive, asymptomatic. . . . No acute distress.Marland Kitchen Respiratory Normal work of breathing on room air.. Notes 03/14/2022: His wound looks better today. It is smaller today and is beginning to  fill in. Minimal slough and a small amount of eschar accumulation. Electronic Signature(s) Signed: 03/14/2022 11:17:24 AM By: Duanne Guess MD FACS Entered By: Duanne Guess on 03/14/2022 11:17:24 -------------------------------------------------------------------------------- Physician Orders Details Patient Name: Date of Service: Anthony Barnes, [name] 03/14/2022 10:45 A M Medical Record Number: [id number] Patient Account Number: [id number] Date of Birth/Sex: Treating RN: [birth year] (66 y.o. M) Primary Care Provider: Hoy Register Other Clinician: Referring Provider: Treating Provider/Extender: Camillo Flaming Weeks in Treatment: 10 Verbal / Phone Orders: No Diagnosis Coding ICD-10 Coding Code Description [phone] Non-pressure chronic ulcer of other part of right lower leg with fat layer exposed E11.622 Type 2 diabetes mellitus with other skin ulcer E11.9 Type 2 diabetes mellitus without complications I10 Essential (primary) hypertension I87.2 Venous insufficiency (chronic) (peripheral) Follow-up Appointments ppointment in 2 weeks. - Dr. Lady Gary - Room 2 - 8/14 at 10:00 AM Return A Nurse Visit: - Room 2 - 11:30 PM Bathing/ Shower/ Hygiene May shower with protection but do not get wound dressing(s) wet. - may purchase a cast protector from [facility] or [facility] Edema Control - Lymphedema / SCD / Other Elevate legs to the level of the heart or above for 30 minutes daily and/or when sitting, a frequency of: Avoid standing for long periods of time. Patient to wear own compression stockings every day. Exercise regularly Moisturize legs daily. Compression stocking or Garment 20-30 mm/Hg pressure to: - to L left until R leg is healed Wound Treatment Wound #2 - Lower Leg Wound Laterality: Right, Lateral Cleanser: Soap and Water 1 x Per Week/30 Days Discharge Instructions: May shower and wash wound with dial antibacterial soap and water prior to dressing  change. Cleanser: Wound Cleanser 1 x Per Week/30 Days Discharge Instructions: Cleanse the wound with wound cleanser prior to applying a clean dressing using gauze sponges, not tissue or cotton balls. Peri-Wound Care: Sween Lotion (Moisturizing lotion) 1 x Per Week/30 Days Discharge Instructions: Apply moisturizing lotion as directed Prim Dressing: Hydrofera Blue Ready Foam, 4x5 in 1 x Per Week/30 Days ary Discharge Instructions: Apply to wound bed as instructed Secondary Dressing: Woven Gauze Sponge, Non-Sterile 4x4 in 1 x Per Week/30 Days Discharge Instructions: Apply over primary dressing as directed. Secured With: Coban Self-Adherent Wrap 4x5 (in/yd) 1 x Per Week/30 Days Discharge Instructions: Secure with Coban as directed. Secured With: [employer], 4.5x3.1 (in/yd) 1 x Per Week/30 Days Discharge Instructions: Secure with Kerlix as directed. Secured With: 81M Medipore H Soft Cloth Surgical T ape, 4 x 10 (in/yd) 1 x Per Week/30 Days Discharge Instructions: Secure with tape as directed. Electronic Signature(s) Signed:  03/14/2022 11:36:06 AM By: Duanne Guess MD FACS Entered By: Duanne Guess on 03/14/2022 11:17:40 -------------------------------------------------------------------------------- Problem List Details Patient Name: Date of Service: Anthony Barnes, [name] 03/14/2022 10:45 A M Medical Record Number: [id number] Patient Account Number: [id number] Date of Birth/Sex: Treating RN: Feb 23, 1956 (66 y.o. M) Primary Care Provider: Hoy Register Other Clinician: Referring Provider: Treating Provider/Extender: Whitney Muse, Odette Horns Weeks in Treatment: 10 Active Problems ICD-10 Encounter Code Description Active Date MDM Diagnosis L97.812 Non-pressure chronic ulcer of other part of right lower leg with fat layer 01/02/2022 No Yes exposed E11.622 Type 2 diabetes mellitus with other skin ulcer 01/02/2022 No Yes E11.9 Type 2 diabetes mellitus without  complications 01/02/2022 No Yes I10 Essential (primary) hypertension 01/02/2022 No Yes I87.2 Venous insufficiency (chronic) (peripheral) 01/02/2022 No Yes Inactive Problems Resolved Problems Electronic Signature(s) Signed: 03/14/2022 11:15:17 AM By: Duanne Guess MD FACS Previous Signature: 03/14/2022 10:56:38 AM Version By: Duanne Guess MD FACS Entered By: Duanne Guess on 03/14/2022 11:15:16 -------------------------------------------------------------------------------- Progress Note Details Patient Name: Date of Service: Anthony Barnes, [name] 03/14/2022 10:45 A M Medical Record Number: [id number] Patient Account Number: [id number] Date of Birth/Sex: Treating RN: 01/24/1956 (66 y.o. M) Primary Care Provider: Hoy Register Other Clinician: Referring Provider: Treating Provider/Extender: Camillo Flaming Weeks in Treatment: 10 Subjective Chief Complaint Information obtained from Patient Right LE Ulcer History of Present Illness (HPI) ADMISSION 03/12/18 This is a 66 year old and it was a type II diabetic reasonably poorly controlled with a recent hemoglobin A1c of 8.2. He was tending to his lawn on 01/15/18 when his weed [name] sent a rock up word hitting him in the right anterior leg. He was seen in his primary M.D. office is same time. An x-ray showed no abnormality. He was given a course of cephalexin. Since then he's been applying peroxide and topical antibiotics to the wound. He does not have a history of chronic wounds however he does have chronic edema in the lower legs. He is complaining of pain in the right leg wound but no real history of claudication. Past medical history includes type 2 diabetes, hypertension, morbid obesity. ABIs in the right leg were noncompressible 03/19/18 on evaluation today patient appears to be tolerating the wrap in general fairly well. He states he's not having that much pain in regard to the wound itself. With that being said he is  having some issues with slough buildup on the surface of the wound the Iodoflex does seem to be helpful in that regard. He is still having discomfort he wonders if I can send in a prescription for ibuprofen or something of like to help him out. I do believe that the prox end may be of benefit for him. 03/25/18 on evaluation today patient appears to be doing very well in regard to his right lateral lower [name]. He's been tolerating the dressing changes without complication that is the wraps. Nonetheless he does continue to have pain he states is been dreading the possibility of having to have debridement again today. His first debridement experience was not optimal. With that being said he does seem to be showing signs of improvement there does appear to be little bit more granulation he does have a lot of slough that really does need to breeding away. 04/04/18;the patient went for arterial studies that were really quite normal. ABI on the right at 1.19 with triphasic waveforms on the left 1.11 with triphasic waveforms. TBI 0.93 on the right and 0.95 on the  left. This suggests T should be able to tolerate even 4 layer compression. He is however complaining of a lot of pain with our wraps I'm wondering whether the pain is simply Iodoflex. I changed him to [employer] I'm still going to try to keep him in 3 layer compression 04/12/18 on evaluation today patient actually appears to be doing rather well in regard to his right lower [name] ulcer. He is making good progress although it is slow still I do believe that the Santyl is much better than the Iodoflex. The good news is the patient seems to be tolerating the dressing changes without complication now that we've gotten good of the Iodoflex which really did burn him quite significantly. Otherwise there's no evidence of infection. 04/17/18 on evaluation today patient actually appears to be showing signs of improvement  in regard to the ulcer. Unfortunately he's had somewhat of a rough day due to the fact that he found out that one of his friends suddenly and unexpectedly passed today. He states he's not sure he's in the frame of mind to allow me to debride the wound at this point. Nonetheless I do feel like he is showing signs of the wound getting better little by little each week. 04/24/18 on evaluation today patient's ulcer actually appears to be showing some signs of improvement albeit slow. He has been tolerating the dressing changes without complication except for the wrap which she states was so tight that he had to remove it it was causing a lot of discomfort. Fortunately there is no evidence of infection at this point. He states he only wore the wrap until about the time he got home last week and that he had to take it off. Nonetheless he does not have any other compression stockings, Juxta-Lite wraps, or anything otherwise to really help at this point as far as compression is concerned. 05/01/18 on evaluation today patient actually appears to be doing fairly well in regard to his lower extremity ulcer. He has been tolerating the dressing changes currently without complication. The wrap does seem to be helping as far as the fluid management is concerned. Wound bed itself actually show signs of good improvement although there is some Slough noted there's not as much as previous and he actually has fairly decent granulation noted as well. Overall I'm pleased with the progress of to this point. The patient would prefer not to have any debridement today he states he's actually not feeling too well in general and he really does not want any additional discomfort typically debridement is fairly uncomfortable for him. 05/08/18 on evaluation today patient actually appears to be doing a little better in regard to his lower extremity ulcer. He has been tolerating the dressing changes without complication. With that being  said I'm actually quite pleased with the fact the wound is not appear to be infected and in general has made a little progress. With that being said I do believe we need to perform some debridement to clean away the necrotic tissue on the surface of the wound. 05/15/18 on evaluation today patient actually appears to be doing a little better in regard to his wound. Fortunately there is some improvement noted fortunately there's also no significant evidence of infection at this point in time. He does have continued pain that really nothing different than previous he did get his Juxta-Lite wrap. 05/29/18 on evaluation today patient actually appears to be doing somewhat better in regard to his wound currently.  He's been tolerating the dressing changes without complication. With that being said he has been performing the [facility] Dressing changes every day at home. I'm pretty sure that we told him every other day last week or when I saw him rather two weeks ago. With that being said obviously did not hurt to do it daily just that obviously is gonna run out of supplies sooner and that could get him into trouble as far as his insurance is concerned. Nonetheless he at this point still continues to have pain although honestly I don't feel like it's as bad as what it was in the past just based on his reactions at this point. 06/12/18 on evaluation today patient actually appears to be doing better in regard to his lower [name] ulcer. He's been tolerating the dressing changes without complication. Fortunately there does not appear to be any evidence of infection at this time. No fevers, chills, nausea, or vomiting noted at this time. 06/19/18 evaluation today patient's ulcer on the lower extremity appears to be doing better at this point. he has been tolerating the dressing changes. The patient seems to be somewhat depressed about the progress of his wound although the overall appearance at this time seems to  be doing well. In general I'm very pleased with the appearance today he does have some biofilm on the surface of the wound as well as of hyper granular tissue we may want to utilize a little bit of silver nitrate today. 06/27/2018; patient comes into clinic today for a wound on the right lateral calf likely related to chronic venous insufficiency. He has been using medihoney covered with Hydrofera Blue. Wound surface actually looks quite good 07/03/2018 seen today for follow-up and management of lower extremity ulcer right lateral shin. T olerating dressing changes. He has a small amount of biofilm today. Will attempt to remove a portion of the biofilm as he tolerates. He becomes very anxious with wound treatments. He would benefit from layer compression wraps however he refuses compression wraps or anything that smokes his legs. He expressed an inability to tolerate compression wraps. Juxta lite wraps have been ordered. He has been instructed on the appropriate application of the juxta leg wraps. His blood pressure today on visit was 200/120. He denies any blurred vision, chest pain, dizziness, shortness of breath, or difficulty with mobility. Mr. Elizondo stated that he had a recent visit with his primary care provider. At that time his carvedilol was decreased from 25 mg daily to 12.5 mg daily. Strongly encouraged Mr. Mallari to seek medical attention today during visit. He declined transport for evaluation of elevated blood pressure. Encouraged him to contact his primary care provider today for medication management. He stated that he would call his primary care doctor after wound visit today. Also encouraged him that if he experienced any symptoms of blurred vision, chest pain, shortness of breath to immediately seek medical attention. 07/17/18 on evaluation today patient's wound actually does seem to show signs of improvement based on the overall appearance of the wound bed today. There does not  appear to show any signs of infection which is good news. There is no overall worsening which is also good news. He still has hyper granular tissue will use in the [employer] followed by [facility] Dressing this point. 07/31/18 on evaluation today patient's wound bed actually show signs of improvement with good epithelialization especially in the upper portion of the wound. He has been tolerating the dressing changes without complication in  general. Overall I'm extremely happy with how things stand. No fevers, chills, nausea, or vomiting noted at this time. 08/28/18 on evaluation today patient appears to be doing a little better in regard to his lower extremity ulcer. With that being said it appears to be very hyper granular I think this is directly attributed to the fact that he continues to use the [employer] underneath the [facility] Dressing. Although this helps not to stick unfortunately also think he's not getting the benefit of the [facility] Dressing particular and subsequently is causing too much moisture buildup hence the hyper granulation. Fortunately there does not appear to be any signs of infection at this time which is good news. 09/03/18 on evaluation today patient appears to be doing well in regard to his lower [name] ulcer. He has been tolerating the dressing changes without complication. Fortunately he has much less hyper granular tissue the noted last week I do think using silver nitrate in changing to using the [facility] Dressing without the mepitel has made a big difference for him this is good news. 09/18/18 on evaluation today patient appears to be doing excellent in regard to his right lower [name] ulcer. This is actually significantly smaller even compared to the last evaluation. Overall I'm very pleased in this regard. I'm a recommend that we likely repeat the silver nitrate today based on what I'm seeing. 10/02/18 on evaluation today patient appears to be doing  excellent in regard to his lower extremity wound on the right. He's been tolerating the dressing changes without complication. Fortunately there's no signs of infection at this time. Overall been very pleased with how things seem to be progressing. No fevers, chills, nausea, or vomiting noted at this time. 10/16/18 on evaluation today patient actually appears to be doing much better in regard to his lower extremity wound on the right. This is shown signs of good improvement and is indeed measuring smaller he is on a excellent track as far as healing is concerned. My hope is this will be healed of the next several weeks. Fortunately there is no evidence of infection at this time. He does seem to be doing everything that I'm recommending for him 10/30/18 on evaluation today patient appears to be doing better in regard to his lower extremity ulcer. He's been tolerating the dressing changes without complication. Fortunately the Hydrofera Blue Dressing to be doing the job. He has made excellent progress. With that being said this is still going somewhat slow but nonetheless is always making good progress at this point. 5/18-Patient was in to be seen for right leg ulcer that appeared after scab above it was removed today. This area had healed completely. 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 The wound has not healed though it has been nearly 2 months since the injury occurred. He was seen in the emergency department at [facility] on May 9. At that time it was very painful and he described it as a "15 on a scale of 0oo10". He also was found to have 3+ pitting edema to the bilateral lower extremities. He was prescribed a weeks course  of Keflex. He is here today because the wound has failed to heal and he  has a prior history in our clinic. ABI in clinic today was noncompressible. He did have formal vascular studies performed in 2019 which were normal. The last hemoglobin A1c I have available for review was from March 21, 2021 and was elevated at 7.7%. The wound is fairly small and circular located about 3 inches above his right lateral malleolus. It is completely covered with eschar. No surrounding erythema, no odor, no purulent drainage. 01/11/2022: The wound on his right lateral leg is a little bit smaller today. It has reaccumulated some slough and a small bit of eschar. It remains fairly painful. He has another area on his anterior tibia that he will not let me touch but I am concerned that there is a wound forming underneath the surface. 01/18/2022: Unfortunately, his wound is a little bit bigger today. He continues to accumulate slough on the wound surface. It is still quite tender. 01/25/2022: The patient bumped his wound on the car door which resulted in bleeding. He was seen at urgent care where it was redressed. Unfortunately, the wound is bigger again today. There is accumulated slough on the surface. Urgent care gave him some tramadol, apparently and he took this prior to his visit so he could tolerate debridement better. 02/02/2022: The wound is a little bit smaller today. The surface, however, has accumulated a fairly thick layer of slough. The periwound skin is intact and there is no obvious sign of infection. 02/07/2022: The wound is a bit larger today. It has thick slough accumulation. The periwound skin is intact and there is no obvious erythema, induration, nor any odor. 02/16/2022: I took a culture last week due to the appearance of the wound and the patient's degree of pain. This grew out methicillin sensitive Staph aureus. Augmentin was prescribed. The patient states that the pain is markedly improved today. The  wound also looks much better. It is smaller with less slough buildup and there is good granulation tissue emerging. 02/21/2022: The patient's pain is quite improved from prior visits. The wound is about the same size and has some accumulated slough on the surface. The base is a little bit fibrotic but there are some areas of granulation tissue filling in. 02/28/2022: The wound is a little bit narrower on its measurements but slightly longer. There is still some slough accumulation on the surface, but he has minimal pain and there is no concern for ongoing infection. Granulation tissue is emerging. 03/07/2022: During the week, he cut holes in his wrap because it was painful and too tight. As a result, his leg was quite a bit more swollen today and his wound was larger. The wound surface is fairly clean but a little bit dry. Minimal slough accumulation. Granulation tissue present. 03/14/2022: Once again, he did some arts and crafts projects with his wrap. On the other hand, however, his wound does look better. It is smaller today and is beginning to fill in. Minimal slough and a small amount of eschar accumulation. Patient History Information obtained from Patient. Family History Cancer - Mother, Diabetes - Mother,Father, Hypertension - Mother,Father, Kidney Disease - Siblings, Stroke - Father, No family history of Heart Disease, Hereditary Spherocytosis, Lung Disease, Seizures, Thyroid Problems, Tuberculosis. Social History Former smoker - ended on 08/14/1990, Marital Status - Married, Alcohol Use - Moderate, Drug Use - No History, Caffeine Use - Daily. Medical History Eyes Denies history of Cataracts, Glaucoma, Optic Neuritis Ear/Nose/Mouth/Throat Denies history of Chronic sinus problems/congestion, Middle ear problems Hematologic/Lymphatic Denies history of Anemia, Hemophilia,  Human Immunodeficiency Virus, Lymphedema, Sickle Cell Disease Respiratory Denies history of Aspiration, Asthma, Chronic  Obstructive Pulmonary Disease (COPD), Pneumothorax, Sleep Apnea, Tuberculosis Cardiovascular Patient has history of Hypertension Denies history of Angina, Arrhythmia, Congestive Heart Failure, Coronary Artery Disease, Deep Vein Thrombosis, Hypotension, Myocardial Infarction, Peripheral Arterial Disease, Peripheral Venous Disease, Phlebitis, Vasculitis Gastrointestinal Denies history of Cirrhosis , Colitis, Crohnoos, Hepatitis A, Hepatitis B, Hepatitis C Endocrine Patient has history of Type II Diabetes Genitourinary Denies history of End Stage Renal Disease Immunological Denies history of Lupus Erythematosus, Raynaudoos, Scleroderma Integumentary (Skin) Denies history of History of Burn Musculoskeletal Denies history of Gout, Rheumatoid Arthritis, Osteoarthritis, Osteomyelitis Neurologic Denies history of Dementia, Neuropathy, Quadriplegia, Paraplegia, Seizure Disorder Oncologic Denies history of Received Chemotherapy, Received Radiation Psychiatric Denies history of Anorexia/bulimia, Confinement Anxiety Hospitalization/Surgery History - esophagogastroduodenoscopy. - brain aneurysm surgery. Medical A Surgical History Notes nd Constitutional Symptoms (General Health) obesity Gastrointestinal diverticulitis Neurologic neuropathy Objective Constitutional Hypertensive, asymptomatic. No acute distress.. Vitals Time Taken: 10:46 AM, Height: 75 in, Weight: 310 lbs, BMI: 38.7, Temperature: 98.4 F, Pulse: 74 bpm, Respiratory Rate: 18 breaths/min, Blood Pressure: 178/94 mmHg. Respiratory Normal work of breathing on room air.. General Notes: 03/14/2022: His wound looks better today. It is smaller today and is beginning to fill in. Minimal slough and a small amount of eschar accumulation. Integumentary (Hair, Skin) Wound #2 status is Open. Original cause of wound was Trauma. The date acquired was: 12/03/2021. The wound has been in treatment 10 weeks. The wound is located on the  Right,Lateral Lower Leg. The wound measures 4.1cm length x 1.5cm width x 0.1cm depth; 4.83cm^2 area and 0.483cm^3 volume. There is Fat Layer (Subcutaneous Tissue) exposed. There is no tunneling or undermining noted. There is a medium amount of serosanguineous drainage noted. The wound margin is distinct with the outline attached to the wound base. There is large (67-100%) red granulation within the wound bed. There is a small (1-33%) amount of necrotic tissue within the wound bed including Adherent Slough. Assessment Active Problems ICD-10 Non-pressure chronic ulcer of other part of right lower leg with fat layer exposed Type 2 diabetes mellitus with other skin ulcer Type 2 diabetes mellitus without complications Essential (primary) hypertension Venous insufficiency (chronic) (peripheral) Procedures Wound #2 Pre-procedure diagnosis of Wound #2 is a Venous Leg Ulcer located on the Right,Lateral Lower Leg .Severity of Tissue Pre Debridement is: Fat layer exposed. There was a Selective/Open Wound Non-Viable Tissue Debridement with a total area of 6.15 sq cm performed by Duanne Guess, MD. With the following instrument(s): Curette to remove Non-Viable tissue/material. Material removed includes Eschar and Slough and after achieving pain control using Lidocaine 5% topical ointment. No specimens were taken. A time out was conducted at 11:09, prior to the start of the procedure. A Minimum amount of bleeding was controlled with Pressure. The procedure was tolerated well with a pain level of 0 throughout and a pain level of 0 following the procedure. Post Debridement Measurements: 4.1cm length x 1.5cm width x 0.1cm depth; 0.483cm^3 volume. Character of Wound/Ulcer Post Debridement is improved. Severity of Tissue Post Debridement is: Fat layer exposed. Post procedure Diagnosis Wound #2: Same as Pre-Procedure Plan Follow-up Appointments: Return Appointment in 2 weeks. - Dr. Lady Gary - Room 2 - 8/14 at  10:00 AM Nurse Visit: - Room 2 - 11:30 PM Bathing/ Shower/ Hygiene: May shower with protection but do not get wound dressing(s) wet. - may purchase a cast protector from [facility] or [facility] Edema Control - Lymphedema / SCD / Other: Elevate  legs to the level of the heart or above for 30 minutes daily and/or when sitting, a frequency of: Avoid standing for long periods of time. Patient to wear own compression stockings every day. Exercise regularly Moisturize legs daily. Compression stocking or Garment 20-30 mm/Hg pressure to: - to L left until R leg is healed WOUND #2: - Lower Leg Wound Laterality: Right, Lateral Cleanser: Soap and Water 1 x Per Week/30 Days Discharge Instructions: May shower and wash wound with dial antibacterial soap and water prior to dressing change. Cleanser: Wound Cleanser 1 x Per Week/30 Days Discharge Instructions: Cleanse the wound with wound cleanser prior to applying a clean dressing using gauze sponges, not tissue or cotton balls. Peri-Wound Care: Sween Lotion (Moisturizing lotion) 1 x Per Week/30 Days Discharge Instructions: Apply moisturizing lotion as directed Prim Dressing: Hydrofera Blue Ready Foam, 4x5 in 1 x Per Week/30 Days ary Discharge Instructions: Apply to wound bed as instructed Secondary Dressing: Woven Gauze Sponge, Non-Sterile 4x4 in 1 x Per Week/30 Days Discharge Instructions: Apply over primary dressing as directed. Secured With: Coban Self-Adherent Wrap 4x5 (in/yd) 1 x Per Week/30 Days Discharge Instructions: Secure with Coban as directed. Secured With: [employer], 4.5x3.1 (in/yd) 1 x Per Week/30 Days Discharge Instructions: Secure with Kerlix as directed. Secured With: 71M Medipore H Soft Cloth Surgical T ape, 4 x 10 (in/yd) 1 x Per Week/30 Days Discharge Instructions: Secure with tape as directed. 03/14/2022: His wound looks better today. It is smaller today and is beginning to fill in. Minimal slough and a small amount of eschar  accumulation. I used a curette to debride slough and eschar from the wound. We will continue using Hydrofera Blue ready foam with Kerlix and Coban compression. Due to scheduling conflicts, he will have just a nurse visit next week to have his wrap changed and I will see him in 2 weeks. Electronic Signature(s) Signed: 03/14/2022 11:18:12 AM By: Duanne Guess MD FACS Entered By: Duanne Guess on 03/14/2022 11:18:12 -------------------------------------------------------------------------------- HxROS Details Patient Name: Date of Service: Anthony Barnes, [name] 03/14/2022 10:45 A M Medical Record Number: [id number] Patient Account Number: [id number] Date of Birth/Sex: Treating RN: [birth year] (66 y.o. M) Primary Care Provider: Hoy Register Other Clinician: Referring Provider: Treating Provider/Extender: Camillo Flaming Weeks in Treatment: 10 Information Obtained From Patient Constitutional Symptoms (General Health) Medical History: Past Medical History Notes: obesity Eyes Medical History: Negative for: Cataracts; Glaucoma; Optic Neuritis Ear/Nose/Mouth/Throat Medical History: Negative for: Chronic sinus problems/congestion; Middle ear problems Hematologic/Lymphatic Medical History: Negative for: Anemia; Hemophilia; Human Immunodeficiency Virus; Lymphedema; Sickle Cell Disease Respiratory Medical History: Negative for: Aspiration; Asthma; Chronic Obstructive Pulmonary Disease (COPD); Pneumothorax; Sleep Apnea; Tuberculosis Cardiovascular Medical History: Positive for: Hypertension Negative for: Angina; Arrhythmia; Congestive Heart Failure; Coronary Artery Disease; Deep Vein Thrombosis; Hypotension; Myocardial Infarction; Peripheral Arterial Disease; Peripheral Venous Disease; Phlebitis; Vasculitis Gastrointestinal Medical History: Negative for: Cirrhosis ; Colitis; Crohns; Hepatitis A; Hepatitis B; Hepatitis C Past Medical History  Notes: diverticulitis Endocrine Medical History: Positive for: Type II Diabetes Time with diabetes: 8 years Treated with: Oral agents Blood sugar tested every day: No Genitourinary Medical History: Negative for: End Stage Renal Disease Immunological Medical History: Negative for: Lupus Erythematosus; Raynauds; Scleroderma Integumentary (Skin) Medical History: Negative for: History of Burn Musculoskeletal Medical History: Negative for: Gout; Rheumatoid Arthritis; Osteoarthritis; Osteomyelitis Neurologic Medical History: Negative for: Dementia; Neuropathy; Quadriplegia; Paraplegia; Seizure Disorder Past Medical History Notes: neuropathy Oncologic Medical History: Negative for: Received Chemotherapy; Received Radiation Psychiatric Medical History: Negative for:  Anorexia/bulimia; Confinement Anxiety Immunizations Pneumococcal Vaccine: Received Pneumococcal Vaccination: No Immunization Notes: tetanus 2 years ago Implantable Devices None Hospitalization / Surgery History Type of Hospitalization/Surgery esophagogastroduodenoscopy brain aneurysm surgery Family and Social History Cancer: Yes - Mother; Diabetes: Yes - Mother,Father; Heart Disease: No; Hereditary Spherocytosis: No; Hypertension: Yes - Mother,Father; Kidney Disease: Yes - Siblings; Lung Disease: No; Seizures: No; Stroke: Yes - Father; Thyroid Problems: No; Tuberculosis: No; Former smoker - ended on 08/14/1990; Marital Status - Married; Alcohol Use: Moderate; Drug Use: No History; Caffeine Use: Daily; Financial Concerns: No; Food, Clothing or Shelter Needs: No; Support System Lacking: No; Transportation Concerns: No Electronic Signature(s) Signed: 03/14/2022 11:36:06 AM By: Duanne Guess MD FACS Entered By: Duanne Guess on 03/14/2022 11:16:42 -------------------------------------------------------------------------------- SuperBill Details Patient Name: Date of Service: Anthony Barnes, [name]  03/14/2022 Medical Record Number: [id number] Patient Account Number: [id number] Date of Birth/Sex: Treating RN: [birth year] (66 y.o. M) Primary Care Provider: Hoy Register Other Clinician: Referring Provider: Treating Provider/Extender: Camillo Flaming Weeks in Treatment: 10 Diagnosis Coding ICD-10 Codes Code Description [phone] Non-pressure chronic ulcer of other part of right lower leg with fat layer exposed E11.622 Type 2 diabetes mellitus with other skin ulcer E11.9 Type 2 diabetes mellitus without complications I10 Essential (primary) hypertension I87.2 Venous insufficiency (chronic) (peripheral) Facility Procedures CPT4 Code: [id number] Description: [phone] - DEBRIDE WOUND 1ST 20 SQ CM OR < ICD-10 Diagnosis Description 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os Modifier: ed Quantity: 1 Physician Procedures : CPT4 Code Description Modifier [id number] - WC PHYS LEVEL 3 - EST PT 25 ICD-10 Diagnosis Description 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osed E11.622 Type 2 diabetes mellitus with other skin ulcer  I10 Essential (primary) hypertension I87.2 Venous insufficiency (chronic) (peripheral) Quantity: 1 : [id number] - WC PHYS DEBR WO ANESTH 20 SQ CM ICD-10 Diagnosis Description 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osed Quantity: 1 Electronic Signature(s) Signed: 03/14/2022 11:18:30 AM By: Duanne Guess MD FACS Entered By: Duanne Guess on 03/14/2022 11:18:30

## 2022-03-14 NOTE — Progress Notes (Signed)
AMARU, BURROUGHS ([id number]) Visit Report for 03/14/2022 Arrival Information Barnes Patient Name: Date of Service: Anthony Barnes 03/14/2022 10:45 A M Medical Record Number: [id number] Patient Account Number: [id number] Date of Birth/Sex: Treating RN: May 17, 1956 (66 y.o. M) Primary Care Icker Swigert: Charlott Rakes Other Clinician: Referring Astella Desir: Treating Jaanai Salemi/Extender: Carter Kitten Weeks in Treatment: 10 Visit Information History Since Last Visit All ordered tests and consults were completed: Yes Patient Arrived: Ambulatory Added or deleted any medications: No Arrival Time: 10:43 Any new allergies or adverse reactions: No Transfer Assistance: None Had a fall or experienced change in No Patient Identification Verified: Yes activities of daily living that may affect Secondary Verification Process Completed: Yes risk of falls: Patient Requires Transmission-Based Precautions: No Signs or symptoms of abuse/neglect since last visito No Hospitalized since last visit: No Implantable device outside of the clinic excluding No cellular tissue based products placed in the center since last visit: Has Dressing in Place as Prescribed: Yes Has Compression in Place as Prescribed: Yes Pain Present Now: No Electronic Signature(s) Signed: 03/14/2022 4:07:51 PM By: Blanche East RN Entered By: Blanche East on 03/14/2022 10:46:04 -------------------------------------------------------------------------------- Lower Extremity Assessment Barnes Patient Name: Date of Service: Anthony Barnes [name] 03/14/2022 10:45 A M Medical Record Number: [id number] Patient Account Number: [id number] Date of Birth/Sex: Treating RN: 07/21/1956 (66 y.o. M) Primary Care Rosalene Wardrop: Charlott Rakes Other Clinician: Referring Julieana Eshleman: Treating Lotus Santillo/Extender: Carter Kitten Weeks in Treatment: 10 Edema Assessment Assessed: [Left: No] [Right: No] [Left: Edema]  [Right: :] Calf Left: Right: Point of Measurement: From Medial Instep 45.6 cm Ankle Left: Right: Point of Measurement: From Medial Instep 28 cm Vascular Assessment Pulses: Dorsalis Pedis Palpable: [Right:Yes] Electronic Signature(s) Signed: 03/14/2022 4:07:51 PM By: Blanche East RN Entered By: Blanche East on 03/14/2022 10:51:29 -------------------------------------------------------------------------------- Multi Wound Chart Barnes Patient Name: Date of Service: Anthony Barnes, [name] 03/14/2022 10:45 A M Medical Record Number: [id number] Patient Account Number: [id number] Date of Birth/Sex: Treating RN: November 27, 1955 (66 y.o. M) Primary Care Hobson Lax: Charlott Rakes Other Clinician: Referring Anatasia Tino: Treating Marcelle Bebout/Extender: Carter Kitten Weeks in Treatment: 10 Vital Signs Height(in): 75 Pulse(bpm): 74 Weight(lbs): 310 Blood Pressure(mmHg): 178/94 Body Mass Index(BMI): 38.7 Temperature(F): 98.4 Respiratory Rate(breaths/min): 18 Photos: [N/A:N/A] Right, Lateral Lower Leg N/A N/A Wound Location: Trauma N/A N/A Wounding Event: Venous Leg Ulcer N/A N/A Primary Etiology: Hypertension, Type II Diabetes N/A N/A Comorbid History: 12/03/2021 N/A N/A Date Acquired: 10 N/A N/A Weeks of Treatment: Open N/A N/A Wound Status: No N/A N/A Wound Recurrence: 4.1x1.5x0.1 N/A N/A Measurements L x W x D (cm) 4.83 N/A N/A A (cm) : rea 0.483 N/A N/A Volume (cm) : -355.70% N/A N/A % Reduction in A rea: -355.70% N/A N/A % Reduction in Volume: Full Thickness Without Exposed N/A N/A Classification: Support Structures Medium N/A N/A Exudate A mount: Serosanguineous N/A N/A Exudate Type: red, brown N/A N/A Exudate Color: Distinct, outline attached N/A N/A Wound Margin: Large (67-100%) N/A N/A Granulation A mount: Red N/A N/A Granulation Quality: Small (1-33%) N/A N/A Necrotic A mount: Fat Layer (Subcutaneous Tissue): Yes N/A N/A Exposed  Structures: Fascia: No Tendon: No Muscle: No Joint: No Bone: No Small (1-33%) N/A N/A Epithelialization: Debridement - Selective/Open Wound N/A N/A Debridement: Pre-procedure Verification/Time Out 11:09 N/A N/A Taken: Lidocaine 5% topical ointment N/A N/A Pain Control: Necrotic/Eschar, Slough N/A N/A Tissue Debrided: Non-Viable Tissue N/A N/A Level: 6.15 N/A N/A Debridement A (sq cm): rea Curette N/A N/A Instrument: Minimum N/A N/A Bleeding:  Pressure N/A N/A Hemostasis Achieved: 0 N/A N/A Procedural Pain: 0 N/A N/A Post Procedural Pain: Procedure was tolerated well N/A N/A Debridement Treatment Response: 4.1x1.5x0.1 N/A N/A Post Debridement Measurements L x W x D (cm) 0.483 N/A N/A Post Debridement Volume: (cm) Debridement N/A N/A Procedures Performed: Treatment Notes Electronic Signature(s) Signed: 03/14/2022 11:15:24 AM By: Fredirick Maudlin MD FACS Entered By: Fredirick Maudlin on 03/14/2022 11:15:24 -------------------------------------------------------------------------------- Multi-Disciplinary Care Plan Barnes Patient Name: Date of Service: Anthony Barnes, [name] 03/14/2022 10:45 A M Medical Record Number: [id number] Patient Account Number: [id number] Date of Birth/Sex: Treating RN: [birth year] (66 y.o. M) Primary Care Jamiesha Victoria: Charlott Rakes Other Clinician: Referring Navia Lindahl: Treating Armetta Henri/Extender: Carter Kitten Weeks in Treatment: 10 Active Inactive Venous Leg Ulcer Nursing Diagnoses: Actual venous Insuffiency (use after diagnosis is confirmed) Knowledge deficit related to disease process and management Goals: Patient will maintain optimal edema control Date Initiated: 01/02/2022 Target Resolution Date: 04/14/2022 Goal Status: Active Patient/caregiver will verbalize understanding of disease process and disease management Date Initiated: 01/02/2022 Date Inactivated: 03/07/2022 Target Resolution Date: 03/09/2022 Goal  Status: Met Interventions: Assess peripheral edema status every visit. Compression as ordered Provide education on venous insufficiency Treatment Activities: Non-invasive vascular studies : 01/02/2022 T ordered outside of clinic : 01/02/2022 est Therapeutic compression applied : 01/02/2022 Notes: Wound/Skin Impairment Nursing Diagnoses: Impaired tissue integrity Knowledge deficit related to ulceration/compromised skin integrity Goals: Patient/caregiver will verbalize understanding of skin care regimen Date Initiated: 01/02/2022 Date Inactivated: 02/02/2022 Target Resolution Date: 02/03/2022 Goal Status: Met Ulcer/skin breakdown will have a volume reduction of 30% by week 4 Date Initiated: 01/02/2022 Target Resolution Date: 04/14/2022 Goal Status: Active Interventions: Assess ulceration(s) every visit Provide education on ulcer and skin care Treatment Activities: Skin care regimen initiated : 01/02/2022 Topical wound management initiated : 01/02/2022 Notes: Electronic Signature(s) Signed: 03/14/2022 4:07:51 PM By: Blanche East RN Entered By: Blanche East on 03/14/2022 10:59:49 -------------------------------------------------------------------------------- Pain Assessment Barnes Patient Name: Date of Service: Anthony Barnes, [name] 03/14/2022 10:45 A M Medical Record Number: [id number] Patient Account Number: [id number] Date of Birth/Sex: Treating RN: [birth year] (66 y.o. M) Primary Care Adlai Sinning: Charlott Rakes Other Clinician: Referring Patrcia Schnepp: Treating Gauge Winski/Extender: Carter Kitten Weeks in Treatment: 10 Active Problems Location of Pain Severity and Description of Pain Patient Has Paino No Site Locations Rate the pain. Current Pain Level: 0 Pain Management and Medication Current Pain Management: Electronic Signature(s) Signed: 03/14/2022 4:07:51 PM By: Blanche East RN Entered By: Blanche East on 03/14/2022  10:51:11 -------------------------------------------------------------------------------- Patient/Caregiver Education Barnes Patient Name: Date of Service: Anthony Barnes, Steptoe. 8/1/2023andnbsp10:45 A M Medical Record Number: [id number] Patient Account Number: [id number] Date of Birth/Gender: Treating RN: Mar 20, 1956 (66 y.o. M) Primary Care Physician: Charlott Rakes Other Clinician: Referring Physician: Treating Physician/Extender: Carter Kitten Weeks in Treatment: 10 Education Assessment Education Provided To: Patient Education Topics Provided Venous: Methods: Explain/Verbal Responses: Reinforcements needed, State content correctly Wound/Skin Impairment: Methods: Explain/Verbal Responses: Reinforcements needed, State content correctly Electronic Signature(s) Signed: 03/14/2022 4:07:51 PM By: Blanche East RN Entered By: Blanche East on 03/14/2022 11:00:11 -------------------------------------------------------------------------------- Wound Assessment Barnes Patient Name: Date of Service: Anthony Barnes [name] 03/14/2022 10:45 A M Medical Record Number: [id number] Patient Account Number: [id number] Date of Birth/Sex: Treating RN: July 11, 1956 (66 y.o. M) Primary Care Rachele Lamaster: Charlott Rakes Other Clinician: Referring Azlyn Wingler: Treating Kallyn Demarcus/Extender: Carter Kitten Weeks in Treatment: 10 Wound Status Wound Number: 2 Primary Etiology: Venous Leg Ulcer Wound Location: Right, Lateral Lower Leg Wound Status: Open Wounding  Event: Trauma Comorbid History: Hypertension, Type II Diabetes Date Acquired: 12/03/2021 Weeks Of Treatment: 10 Clustered Wound: No Photos Wound Measurements Length: (cm) 4.1 Width: (cm) 1.5 Depth: (cm) 0.1 Area: (cm) 4.83 Volume: (cm) 0.483 Wound Description Classification: Full Thickness Without Exposed Support Structu Wound Margin: Distinct, outline attached Exudate Amount: Medium Exudate Type:  Serosanguineous Exudate Color: red, brown Foul Odor After Cleansing: Slough/Fibrino % Reduction in Area: -355.7% % Reduction in Volume: -355.7% Epithelialization: Small (1-33%) Tunneling: No Undermining: No res No Yes Wound Bed Granulation Amount: Large (67-100%) Exposed Structure Granulation Quality: Red Fascia Exposed: No Necrotic Amount: Small (1-33%) Fat Layer (Subcutaneous Tissue) Exposed: Yes Necrotic Quality: Adherent Slough Tendon Exposed: No Muscle Exposed: No Joint Exposed: No Bone Exposed: No Electronic Signature(s) Signed: 03/14/2022 4:07:51 PM By: Blanche East RN Entered By: Blanche East on 03/14/2022 10:57:43 -------------------------------------------------------------------------------- Anthony Barnes Patient Name: Date of Service: Anthony Barnes, [name] 03/14/2022 10:45 A M Medical Record Number: [id number] Patient Account Number: [id number] Date of Birth/Sex: Treating RN: [birth year] (66 y.o. M) Primary Care Louiza Moor: Charlott Rakes Other Clinician: Referring Mansoor Hillyard: Treating Craig Ionescu/Extender: Carter Kitten Weeks in Treatment: 10 Vital Signs Time Taken: 10:46 Temperature (F): 98.4 Height (in): 75 Pulse (bpm): 74 Weight (lbs): 310 Respiratory Rate (breaths/min): 18 Body Mass Index (BMI): 38.7 Blood Pressure (mmHg): 178/94 Reference Range: 80 - 120 mg / dl Electronic Signature(s) Signed: 03/14/2022 4:07:51 PM By: Blanche East RN Entered By: Blanche East on 03/14/2022 10:47:50

## 2022-03-16 ENCOUNTER — Ambulatory Visit (INDEPENDENT_AMBULATORY_CARE_PROVIDER_SITE_OTHER): Payer: Medicare Other

## 2022-03-16 DIAGNOSIS — I739 Peripheral vascular disease, unspecified: Secondary | ICD-10-CM | POA: Diagnosis not present

## 2022-03-20 ENCOUNTER — Encounter (HOSPITAL_BASED_OUTPATIENT_CLINIC_OR_DEPARTMENT_OTHER): Payer: Medicare Other | Admitting: General Surgery

## 2022-03-20 ENCOUNTER — Other Ambulatory Visit: Payer: Self-pay | Admitting: Family Medicine

## 2022-03-20 DIAGNOSIS — I10 Essential (primary) hypertension: Secondary | ICD-10-CM | POA: Diagnosis not present

## 2022-03-20 DIAGNOSIS — L97812 Non-pressure chronic ulcer of other part of right lower leg with fat layer exposed: Secondary | ICD-10-CM | POA: Diagnosis not present

## 2022-03-20 DIAGNOSIS — Z6838 Body mass index (BMI) 38.0-38.9, adult: Secondary | ICD-10-CM | POA: Diagnosis not present

## 2022-03-20 DIAGNOSIS — I872 Venous insufficiency (chronic) (peripheral): Secondary | ICD-10-CM | POA: Diagnosis not present

## 2022-03-20 DIAGNOSIS — E11622 Type 2 diabetes mellitus with other skin ulcer: Secondary | ICD-10-CM | POA: Diagnosis not present

## 2022-03-20 NOTE — Progress Notes (Signed)
Anthony Barnes, Anthony Barnes ([id number]) Visit Report for 03/20/2022 SuperBill Details Patient Name: Date of Service: Anthony Barnes 03/20/2022 Medical Record Number: [id number] Patient Account Number: [id number] Date of Birth/Sex: Treating RN: 1956/01/13 (66 y.o. [name] Primary Care Provider: Hoy Register Other Clinician: Referring Provider: Treating Provider/Extender: Camillo Flaming Weeks in Treatment: 11 Diagnosis Coding ICD-10 Codes Code Description [phone] Non-pressure chronic ulcer of other part of right lower leg with fat layer exposed E11.622 Type 2 diabetes mellitus with other skin ulcer E11.9 Type 2 diabetes mellitus without complications I10 Essential (primary) hypertension I87.2 Venous insufficiency (chronic) (peripheral) Facility Procedures CPT4 Code Description Modifier Quantity [id number] [phone] - WOUND CARE VISIT-LEV 2 EST PT 1 Electronic Signature(s) Signed: 03/20/2022 11:59:13 AM By: Duanne Guess MD FACS Signed: 03/20/2022 4:26:03 PM By: Samuella Bruin Entered By: Samuella Bruin on 03/20/2022 11:48:22

## 2022-03-20 NOTE — Progress Notes (Signed)
LEROI, HAQUE ([id number]) Visit Report for 03/20/2022 Arrival Information Details Patient Name: Date of Service: [name] 03/20/2022 11:30 A M Medical Record Number: [id number] Patient Account Number: [id number] Date of Birth/Sex: Treating RN: 09/16/1955 (66 y.o. [name] Primary Care Kendrew Paci: Hoy Register Other Clinician: Referring Jabre Heo: Treating Kalven Ganim/Extender: Camillo Flaming Weeks in Treatment: 11 Visit Information History Since Last Visit Added or deleted any medications: No Patient Arrived: Ambulatory Any new allergies or adverse reactions: No Arrival Time: 11:30 Had a fall or experienced change in No Accompanied By: self activities of daily living that may affect Transfer Assistance: None risk of falls: Patient Identification Verified: Yes Signs or symptoms of abuse/neglect since last visito No Secondary Verification Process Completed: Yes Hospitalized since last visit: No Patient Requires Transmission-Based Precautions: No Implantable device outside of the clinic excluding No cellular tissue based products placed in the center since last visit: Has Dressing in Place as Prescribed: Yes Pain Present Now: Yes Electronic Signature(s) Signed: 03/20/2022 4:26:03 PM By: Samuella Bruin Entered By: Samuella Bruin on 03/20/2022 11:30:20 -------------------------------------------------------------------------------- Clinic Level of Care Assessment Details Patient Name: Date of Service: [name] 03/20/2022 11:30 A M Medical Record Number: [id number] Patient Account Number: [id number] Date of Birth/Sex: Treating RN: [birth year] (66 y.o. [name] Primary Care Lui Bellis: Hoy Register Other Clinician: Referring Martha Soltys: Treating Solei Wubben/Extender: Camillo Flaming Weeks in Treatment: 11 Clinic Level of Care Assessment Items TOOL 4 Quantity Score X- 1 0 Use when only an EandM  is performed on FOLLOW-UP visit ASSESSMENTS - Nursing Assessment / Reassessment X- 1 10 Reassessment of Co-morbidities (includes updates in patient status) X- 1 5 Reassessment of Adherence to Treatment Plan ASSESSMENTS - Wound and Skin A ssessment / Reassessment []  - 0 Simple Wound Assessment / Reassessment - one wound []  - 0 Complex Wound Assessment / Reassessment - multiple wounds []  - 0 Dermatologic / Skin Assessment (not related to wound area) ASSESSMENTS - Focused Assessment []  - 0 Circumferential Edema Measurements - multi extremities []  - 0 Nutritional Assessment / Counseling / Intervention []  - 0 Lower Extremity Assessment (monofilament, tuning fork, pulses) []  - 0 Peripheral Arterial Disease Assessment (using hand held doppler) ASSESSMENTS - Ostomy and/or Continence Assessment and Care []  - 0 Incontinence Assessment and Management []  - 0 Ostomy Care Assessment and Management (repouching, etc.) PROCESS - Coordination of Care X - Simple Patient / Family Education for ongoing care 1 15 []  - 0 Complex (extensive) Patient / Family Education for ongoing care X- 1 10 Staff obtains , Records, T Results / Process Orders est []  - 0 Staff telephones HHA, Nursing Homes / Clarify orders / etc []  - 0 Routine Transfer to another Facility (non-emergent condition) []  - 0 Routine Hospital Admission (non-emergent condition) []  - 0 New Admissions / / Ordering NPWT Apligraf, etc. , []  - 0 Emergency Hospital Admission (emergent condition) X- 1 10 Simple Discharge Coordination []  - 0 Complex (extensive) Discharge Coordination PROCESS - Special Needs []  - 0 Pediatric / Minor Patient Management []  - 0 Isolation Patient Management []  - 0 Hearing / Language / Visual special needs []  - 0 Assessment of Community assistance (transportation, D/C planning, etc.) []  - 0 Additional assistance / Altered mentation []  - 0 Support Surface(s) Assessment  (bed, cushion, seat, etc.) INTERVENTIONS - Wound Cleansing / Measurement X - Simple Wound Cleansing - one wound 1 5 []  - 0 Complex Wound Cleansing - multiple wounds []  - 0  Wound Imaging (photographs - any number of wounds) []  - 0 Wound Tracing (instead of photographs) []  - 0 Simple Wound Measurement - one wound []  - 0 Complex Wound Measurement - multiple wounds INTERVENTIONS - Wound Dressings []  - 0 Small Wound Dressing one or multiple wounds X- 1 15 Medium Wound Dressing one or multiple wounds []  - 0 Large Wound Dressing one or multiple wounds []  - 0 Application of Medications - topical []  - 0 Application of Medications - injection INTERVENTIONS - Miscellaneous []  - 0 External ear exam []  - 0 Specimen Collection (cultures, biopsies, blood, body fluids, etc.) []  - 0 Specimen(s) / Culture(s) sent or taken to Lab for analysis []  - 0 Patient Transfer (multiple staff / / Similar devices) []  - 0 Simple Staple / Suture removal (25 or less) []  - 0 Complex Staple / Suture removal (26 or more) []  - 0 Hypo / Hyperglycemic Management (close monitor of Blood Glucose) []  - 0 Ankle / Brachial Index (ABI) - do not check if billed separately X- 1 5 Vital Signs Has the patient been seen at the hospital within the last three years: Yes Total Score: 75 Level Of Care: New/Established - Level 2 Electronic Signature(s) Signed: 03/20/2022 4:26:03 PM By: Entered By: on 03/20/2022 11:48:17 -------------------------------------------------------------------------------- Encounter Discharge Information Details Patient Name: Date of Service: , [name] 03/20/2022 11:30 A M Medical Record Number: Patient Account Number: Date of Birth/Sex: Treating RN: [birth year] (66 y.o. Nurse, adult Primary Care Moani Weipert: Other Clinician: Referring Raliyah Montella: Treating Charde Macfarlane/Extender: Weeks in Treatment: 11 Encounter Discharge Information Items Discharge Condition: Stable Ambulatory Status: Ambulatory Discharge Destination: Home Transportation: Private Auto Accompanied By: self Schedule Follow-up Appointment: Yes Clinical Summary of Care: Patient Declined Electronic Signature(s) Signed: 03/20/2022 4:26:03 PM By: By: 05/20/2022 on 03/20/2022 11:47:28 -------------------------------------------------------------------------------- Patient/Caregiver Education Details Patient Name: Date of Service: Samuella Bruin, [name] 8/7/2023andnbsp11:30 A M Medical Record Number: [name] Patient Account Number: 05/20/2022 Date of Birth/Gender: Treating RN: 1956-04-25 (66 y.o. 10/12/1955 Primary Care Physician: 79 Other Clinician: Referring Physician: Treating Physician/Extender: [name] Weeks in Treatment: 11 Education Assessment Education Provided To: Patient Education Topics Provided Wound/Skin Impairment: Methods: Explain/Verbal Responses: Reinforcements needed, State content correctly Electronic Signature(s) Signed: 03/20/2022 4:26:03 PM By: Camillo Flaming Entered By: 05/20/2022 on 03/20/2022 11:47:10 -------------------------------------------------------------------------------- Wound Assessment Details Patient Name: Date of Service: Samuella Bruin [name] 03/20/2022 11:30 A M Medical Record Number: [name] Patient Account Number: 05/20/2022 Date of Birth/Sex: Treating RN: 01-15-1956 (66 y.o. 10/12/1955 Primary Care Janera Peugh: 79 Other Clinician: Referring Claudett Bayly: Treating Sanyah Molnar/Extender: [name] Weeks in Treatment: 11 Wound Status Wound Number: 2 Primary Etiology: Venous Leg Ulcer Wound Location: Right, Lateral Lower Leg Wound Status: Open Wounding Event: Trauma Comorbid History:  Hypertension, Type II Diabetes Date Acquired: 12/03/2021 Weeks Of Treatment: 11 Clustered Wound: No Wound Measurements Length: (cm) 4.1 Width: (cm) 1.5 Depth: (cm) 0.1 Area: (cm) 4.83 Volume: (cm) 0.483 % Reduction in Area: -355.7% % Reduction in Volume: -355.7% Epithelialization: Small (1-33%) Wound Description Classification: Full Thickness Without Exposed Support Structures Wound Margin: Distinct, outline attached Exudate Amount: Medium Exudate Type: Serosanguineous Exudate Color: red, brown Foul Odor After Cleansing: No Slough/Fibrino Yes Wound Bed Granulation Amount: Large (67-100%) Exposed Structure Granulation Quality: Red Fascia Exposed: No Necrotic Amount: Small (1-33%) Fat Layer (Subcutaneous Tissue) Exposed: Yes Necrotic Quality: Adherent  Slough Tendon Exposed: No Muscle Exposed: No Joint Exposed: No Bone Exposed: No Treatment Notes Wound #2 (Lower Leg) Wound Laterality: Right, Lateral Cleanser Soap and Water Discharge Instruction: May shower and wash wound with dial antibacterial soap and water prior to dressing change. Wound Cleanser Discharge Instruction: Cleanse the wound with wound cleanser prior to applying a clean dressing using gauze sponges, not tissue or cotton balls. Peri-Wound Care Sween Lotion (Moisturizing lotion) Discharge Instruction: Apply moisturizing lotion as directed Topical Primary Dressing Hydrofera Blue Ready Foam, 4x5 in Discharge Instruction: Apply to wound bed as instructed Secondary Dressing Woven Gauze Sponge, Non-Sterile 4x4 in Discharge Instruction: Apply over primary dressing as directed. Secured With [employer] 4x5 (in/yd) Discharge Instruction: Secure with Coban as directed. Kerlix Roll Sterile, 4.5x3.1 (in/yd) Discharge Instruction: Secure with Kerlix as directed. 1M Medipore H Soft Cloth Surgical T ape, 4 x 10 (in/yd) Discharge Instruction: Secure with tape as directed. Compression Wrap Compression  Stockings Add-Ons Electronic Signature(s) Signed: 03/20/2022 4:26:03 PM By: Samuella Bruin Entered By: Samuella Bruin on 03/20/2022 11:30:45 -------------------------------------------------------------------------------- Vitals Details Patient Name: Date of Service: [name], [name] 03/20/2022 11:30 A M Medical Record Number: [id number] Patient Account Number: [id number] Date of Birth/Sex: Treating RN: [birth year] (66 y.o. [name] Primary Care Reneka Nebergall: Hoy Register Other Clinician: Referring Equilla Que: Treating Aslee Such/Extender: Camillo Flaming Weeks in Treatment: 11 Vital Signs Time Taken: 11:33 Temperature (F): 97.8 Height (in): 75 Pulse (bpm): 85 Weight (lbs): 310 Respiratory Rate (breaths/min): 18 Body Mass Index (BMI): 38.7 Blood Pressure (mmHg): 152/100 Reference Range: 80 - 120 mg / dl Electronic Signature(s) Signed: 03/20/2022 4:26:03 PM By: Samuella Bruin Entered By: Samuella Bruin on 03/20/2022 11:33:52

## 2022-03-21 NOTE — Telephone Encounter (Signed)
Requested medication (s) are due for refill today - yes  Requested medication (s) are on the active medication list -yes  Future visit scheduled -no  Last refill: 03/08/22 #7  Notes to clinic: non delegated Rx  Requested Prescriptions  Pending Prescriptions Disp Refills   traMADol (ULTRAM) 50 MG tablet [Pharmacy Med Name: traMADol HCl Oral Tablet 50 MG] 7 tablet 0    Sig: TAKE ONE TABLET BY MOUTH TWICE DAILY BEFORE AND AFTER DEBRIDEMENT PROCEDURE     Not Delegated - Analgesics:  Opioid Agonists Failed - 03/20/2022 11:56 AM      Failed - This refill cannot be delegated      Failed - Urine Drug Screen completed in last 360 days      Passed - Valid encounter within last 3 months    Recent Outpatient Visits           1 month ago Type 2 diabetes mellitus with other neurologic complication, without long-term current use of insulin (HCC)   [facility] [place], [place], [place]   1 year ago Type 2 diabetes mellitus with other neurologic complication, without long-term current use of insulin (HCC)   [facility] [place], Odette Horns, MD   1 year ago Myalgia   [facility] [place], [place], MD   1 year ago Type 2 diabetes mellitus with other neurologic complication, without long-term current use of insulin (HCC)   [facility] [place], [place], MD   2 years ago Type 2 diabetes mellitus with diabetic polyneuropathy, without long-term current use of insulin (HCC)   [facility] [place], Odette Horns, MD                 Requested Prescriptions  Pending Prescriptions Disp Refills   traMADol (ULTRAM) 50 MG tablet [Pharmacy Med Name: traMADol HCl Oral Tablet 50 MG] 7 tablet 0    Sig: TAKE ONE TABLET BY MOUTH TWICE DAILY BEFORE AND AFTER DEBRIDEMENT PROCEDURE     Not Delegated - Analgesics:  Opioid Agonists Failed - 03/20/2022 11:56 AM      Failed - This refill  cannot be delegated      Failed - Urine Drug Screen completed in last 360 days      Passed - Valid encounter within last 3 months    Recent Outpatient Visits           1 month ago Type 2 diabetes mellitus with other neurologic complication, without long-term current use of insulin (HCC)   [facility] [place], [place], MD   1 year ago Type 2 diabetes mellitus with other neurologic complication, without long-term current use of insulin (HCC)   [facility] [place], Odette Horns, MD   1 year ago Myalgia   [facility] [place], [place], MD   1 year ago Type 2 diabetes mellitus with other neurologic complication, without long-term current use of insulin (HCC)    [facility] [place], Odette Horns, MD   2 years ago Type 2 diabetes mellitus with diabetic polyneuropathy, without long-term current use of insulin (HCC)    [facility] [place], MD

## 2022-03-27 ENCOUNTER — Encounter (HOSPITAL_BASED_OUTPATIENT_CLINIC_OR_DEPARTMENT_OTHER): Payer: Medicare Other | Admitting: General Surgery

## 2022-03-27 DIAGNOSIS — Z6838 Body mass index (BMI) 38.0-38.9, adult: Secondary | ICD-10-CM | POA: Diagnosis not present

## 2022-03-27 DIAGNOSIS — L97812 Non-pressure chronic ulcer of other part of right lower leg with fat layer exposed: Secondary | ICD-10-CM | POA: Diagnosis not present

## 2022-03-27 DIAGNOSIS — I872 Venous insufficiency (chronic) (peripheral): Secondary | ICD-10-CM | POA: Diagnosis not present

## 2022-03-27 DIAGNOSIS — E11622 Type 2 diabetes mellitus with other skin ulcer: Secondary | ICD-10-CM | POA: Diagnosis not present

## 2022-03-27 DIAGNOSIS — I10 Essential (primary) hypertension: Secondary | ICD-10-CM | POA: Diagnosis not present

## 2022-03-31 NOTE — Progress Notes (Signed)
Anthony, Barnes ([id number]) Visit Report for 03/27/2022 Chief Complaint Document Details Patient Name: Date of Service: Anthony Barnes [name] 03/27/2022 10:00 A M Medical Record Number: [id number] Patient Account Number: [id number] Date of Birth/Sex: Treating RN: October 29, 1955 (66 y.o. Anthony Barnes Primary Care Provider: Hoy Barnes Other Clinician: Referring Provider: Treating Provider/Extender: Anthony Barnes in Treatment: 12 Information Obtained from: Patient Chief Complaint Right LE Ulcer Electronic Signature(s) Signed: 03/27/2022 10:25:05 AM By: Anthony Guess MD FACS Entered By: Anthony Barnes on 03/27/2022 10:25:05 -------------------------------------------------------------------------------- Debridement Details Patient Name: Date of Service: Anthony Barnes, [name] 03/27/2022 10:00 A M Medical Record Number: [id number] Patient Account Number: [id number] Date of Birth/Sex: Treating RN: 27-Apr-1956 (66 y.o. Anthony Barnes Primary Care Provider: Hoy Barnes Other Clinician: Referring Provider: Treating Provider/Extender: Anthony Barnes in Treatment: 12 Debridement Performed for Assessment: Wound #2 Right,Lateral Lower Leg Performed By: Physician Anthony Guess, MD Debridement Type: Debridement Severity of Tissue Pre Debridement: Fat layer exposed Level of Consciousness (Pre-procedure): Awake and Alert Pre-procedure Verification/Time Out Yes - 10:15 Taken: Start Time: 10:15 Pain Control: Lidocaine 5% topical ointment T Area Debrided (L x W): otal 4.9 (cm) x 1.8 (cm) = 8.82 (cm) Tissue and other material debrided: Non-Viable, Slough, Subcutaneous, Slough Level: Skin/Subcutaneous Tissue Debridement Description: Excisional Instrument: Curette Bleeding: Minimum Hemostasis Achieved: Pressure Procedural Pain: 0 Post Procedural Pain: 0 Response to Treatment: Procedure was tolerated well Level of  Consciousness (Post- Awake and Alert procedure): Post Debridement Measurements of Total Wound Length: (cm) 4.9 Width: (cm) 1.8 Depth: (cm) 0.1 Volume: (cm) 0.693 Character of Wound/Ulcer Post Debridement: Improved Severity of Tissue Post Debridement: Fat layer exposed Post Procedure Diagnosis Same as Pre-procedure Electronic Signature(s) Signed: 03/27/2022 10:46:29 AM By: Anthony Guess MD FACS Signed: 03/27/2022 4:58:09 PM By: Anthony Barnes Entered By: Anthony Barnes on 03/27/2022 10:16:36 -------------------------------------------------------------------------------- HPI Details Patient Name: Date of Service: Anthony Barnes, [name] 03/27/2022 10:00 A M Medical Record Number: [id number] Patient Account Number: [id number] Date of Birth/Sex: Treating RN: [birth year] (66 y.o. Anthony Barnes Primary Care Provider: Hoy Barnes Other Clinician: Referring Provider: Treating Provider/Extender: Anthony Barnes in Treatment: 12 History of Present Illness HPI Description: ADMISSION 03/12/18 This is a 66 year old and it was a type II diabetic reasonably poorly controlled with a recent hemoglobin A1c of 8.2. He was tending to his lawn on 01/15/18 when his weed Anthony Barnes sent a rock up word hitting him in the right anterior leg. He was seen in his primary M.D. office is same time. An x-ray showed no abnormality. He was given a course of cephalexin. Since then he's been applying peroxide and topical antibiotics to the wound. He does not have a history of chronic wounds however he does have chronic edema in the lower legs. He is complaining of pain in the right leg wound but no real history of claudication. Past medical history includes type 2 diabetes, hypertension, morbid obesity. ABIs in the right leg were noncompressible 03/19/18 on evaluation today patient appears to be tolerating the wrap in general fairly well. He states he's not having that much pain in  regard to the wound itself. With that being said he is having some issues with slough buildup on the surface of the wound the Iodoflex does seem to be helpful in that regard. He is still having discomfort he wonders if I can send in a prescription for ibuprofen or something of like to help him out. I do believe that the  prox end may be of benefit for him. 03/25/18 on evaluation today patient appears to be doing very well in regard to his right lateral lower [name]. He's been tolerating the dressing changes without complication that is the wraps. Nonetheless he does continue to have pain he states is been dreading the possibility of having to have debridement again today. His first debridement experience was not optimal. With that being said he does seem to be showing signs of improvement there does appear to be little bit more granulation he does have a lot of slough that really does need to breeding away. 04/04/18;the patient went for arterial studies that were really quite normal. ABI on the right at 1.19 with triphasic waveforms on the left 1.11 with triphasic waveforms. TBI 0.93 on the right and 0.95 on the left. This suggests T should be able to tolerate even 4 layer compression. He is however complaining of a lot of pain with our wraps I'm wondering whether the pain is simply Iodoflex. I changed him to [employer] I'm still going to try to keep him in 3 layer compression 04/12/18 on evaluation today patient actually appears to be doing rather well in regard to his right lower [name] ulcer. He is making good progress although it is slow still I do believe that the Santyl is much better than the Iodoflex. The good news is the patient seems to be tolerating the dressing changes without complication now that we've gotten good of the Iodoflex which really did burn him quite significantly. Otherwise there's no evidence of infection. 04/17/18 on evaluation today  patient actually appears to be showing signs of improvement in regard to the ulcer. Unfortunately he's had somewhat of a rough day due to the fact that he found out that one of his friends suddenly and unexpectedly passed today. He states he's not sure he's in the frame of mind to allow me to debride the wound at this point. Nonetheless I do feel like he is showing signs of the wound getting better little by little each week. 04/24/18 on evaluation today patient's ulcer actually appears to be showing some signs of improvement albeit slow. He has been tolerating the dressing changes without complication except for the wrap which she states was so tight that he had to remove it it was causing a lot of discomfort. Fortunately there is no evidence of infection at this point. He states he only wore the wrap until about the time he got home last week and that he had to take it off. Nonetheless he does not have any other compression stockings, Juxta-Lite wraps, or anything otherwise to really help at this point as far as compression is concerned. 05/01/18 on evaluation today patient actually appears to be doing fairly well in regard to his lower extremity ulcer. He has been tolerating the dressing changes currently without complication. The wrap does seem to be helping as far as the fluid management is concerned. Wound bed itself actually show signs of good improvement although there is some Slough noted there's not as much as previous and he actually has fairly decent granulation noted as well. Overall I'm pleased with the progress of to this point. The patient would prefer not to have any debridement today he states he's actually not feeling too well in general and he really does not want any additional discomfort typically debridement is fairly uncomfortable for him. 05/08/18 on evaluation today patient actually appears to be doing a little  better in regard to his lower extremity ulcer. He has been tolerating  the dressing changes without complication. With that being said I'm actually quite pleased with the fact the wound is not appear to be infected and in general has made a little progress. With that being said I do believe we need to perform some debridement to clean away the necrotic tissue on the surface of the wound. 05/15/18 on evaluation today patient actually appears to be doing a little better in regard to his wound. Fortunately there is some improvement noted fortunately there's also no significant evidence of infection at this point in time. He does have continued pain that really nothing different than previous he did get his Juxta-Lite wrap. 05/29/18 on evaluation today patient actually appears to be doing somewhat better in regard to his wound currently. He's been tolerating the dressing changes without complication. With that being said he has been performing the [facility] Dressing changes every day at home. I'm pretty sure that we told him every other day last week or when I saw him rather two Barnes ago. With that being said obviously did not hurt to do it daily just that obviously is gonna run out of supplies sooner and that could get him into trouble as far as his insurance is concerned. Nonetheless he at this point still continues to have pain although honestly I don't feel like it's as bad as what it was in the past just based on his reactions at this point. 06/12/18 on evaluation today patient actually appears to be doing better in regard to his lower [name] ulcer. He's been tolerating the dressing changes without complication. Fortunately there does not appear to be any evidence of infection at this time. No fevers, chills, nausea, or vomiting noted at this time. 06/19/18 evaluation today patient's ulcer on the lower extremity appears to be doing better at this point. he has been tolerating the dressing changes. The patient seems to be somewhat depressed about the progress of his  wound although the overall appearance at this time seems to be doing well. In general I'm very pleased with the appearance today he does have some biofilm on the surface of the wound as well as of hyper granular tissue we may want to utilize a little bit of silver nitrate today. 06/27/2018; patient comes into clinic today for a wound on the right lateral calf likely related to chronic venous insufficiency. He has been using medihoney covered with Hydrofera Blue. Wound surface actually looks quite good 07/03/2018 seen today for follow-up and management of lower extremity ulcer right lateral shin. T olerating dressing changes. He has a small amount of biofilm today. Will attempt to remove a portion of the biofilm as he tolerates. He becomes very anxious with wound treatments. He would benefit from layer compression wraps however he refuses compression wraps or anything that smokes his legs. He expressed an inability to tolerate compression wraps. Juxta lite wraps have been ordered. He has been instructed on the appropriate application of the juxta leg wraps. His blood pressure today on visit was 200/120. He denies any blurred vision, chest pain, dizziness, shortness of breath, or difficulty with mobility. Mr. Evinger stated that he had a recent visit with his primary care provider. At that time his carvedilol was decreased from 25 mg daily to 12.5 mg daily. Strongly encouraged Mr. Haberer to seek medical attention today during visit. He declined transport for evaluation of elevated blood pressure. Encouraged him to contact his primary  care provider today for medication management. He stated that he would call his primary care doctor after wound visit today. Also encouraged him that if he experienced any symptoms of blurred vision, chest pain, shortness of breath to immediately seek medical attention. 07/17/18 on evaluation today patient's wound actually does seem to show signs of improvement based on the  overall appearance of the wound bed today. There does not appear to show any signs of infection which is good news. There is no overall worsening which is also good news. He still has hyper granular tissue will use in the [employer] followed by [facility] Dressing this point. 07/31/18 on evaluation today patient's wound bed actually show signs of improvement with good epithelialization especially in the upper portion of the wound. He has been tolerating the dressing changes without complication in general. Overall I'm extremely happy with how things stand. No fevers, chills, nausea, or vomiting noted at this time. 08/28/18 on evaluation today patient appears to be doing a little better in regard to his lower extremity ulcer. With that being said it appears to be very hyper granular I think this is directly attributed to the fact that he continues to use the [employer] underneath the [facility] Dressing. Although this helps not to stick unfortunately also think he's not getting the benefit of the [facility] Dressing particular and subsequently is causing too much moisture buildup hence the hyper granulation. Fortunately there does not appear to be any signs of infection at this time which is good news. 09/03/18 on evaluation today patient appears to be doing well in regard to his lower [name] ulcer. He has been tolerating the dressing changes without complication. Fortunately he has much less hyper granular tissue the noted last week I do think using silver nitrate in changing to using the [facility] Dressing without the mepitel has made a big difference for him this is good news. 09/18/18 on evaluation today patient appears to be doing excellent in regard to his right lower [name] ulcer. This is actually significantly smaller even compared to the last evaluation. Overall I'm very pleased in this regard. I'm a recommend that we likely repeat the silver nitrate today based on what I'm  seeing. 10/02/18 on evaluation today patient appears to be doing excellent in regard to his lower extremity wound on the right. He's been tolerating the dressing changes without complication. Fortunately there's no signs of infection at this time. Overall been very pleased with how things seem to be progressing. No fevers, chills, nausea, or vomiting noted at this time. 10/16/18 on evaluation today patient actually appears to be doing much better in regard to his lower extremity wound on the right. This is shown signs of good improvement and is indeed measuring smaller he is on a excellent track as far as healing is concerned. My hope is this will be healed of the next several Barnes. Fortunately there is no evidence of infection at this time. He does seem to be doing everything that I'm recommending for him 10/30/18 on evaluation today patient appears to be doing better in regard to his lower extremity ulcer. He's been tolerating the dressing changes without complication. Fortunately the Hydrofera Blue Dressing to be doing the job. He has made excellent progress. With that being said this is still going somewhat slow but nonetheless is always making good progress at this point. 5/18-Patient was in to be seen for right leg ulcer that appeared after scab above it was removed today. This area  had healed completely. 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 The wound has not healed though it has been nearly 2 months since the injury occurred. He was seen in the emergency department at [facility] on May 9. At that time it was very painful and he described it as a "15 on a scale of 010". He also was found to have 3+ pitting edema to the  bilateral lower extremities. He was prescribed a Barnes course of Keflex. He is here today because the wound has failed to heal and he has a prior history in our clinic. ABI in clinic today was noncompressible. He did have formal vascular studies performed in 2019 which were normal. The last hemoglobin A1c I have available for review was from March 21, 2021 and was elevated at 7.7%. The wound is fairly small and circular located about 3 inches above his right lateral malleolus. It is completely covered with eschar. No surrounding erythema, no odor, no purulent drainage. 01/11/2022: The wound on his right lateral leg is a little bit smaller today. It has reaccumulated some slough and a small bit of eschar. It remains fairly painful. He has another area on his anterior tibia that he will not let me touch but I am concerned that there is a wound forming underneath the surface. 01/18/2022: Unfortunately, his wound is a little bit bigger today. He continues to accumulate slough on the wound surface. It is still quite tender. 01/25/2022: The patient bumped his wound on the car door which resulted in bleeding. He was seen at urgent care where it was redressed. Unfortunately, the wound is bigger again today. There is accumulated slough on the surface. Urgent care gave him some tramadol, apparently and he took this prior to his visit so he could tolerate debridement better. 02/02/2022: The wound is a little bit smaller today. The surface, however, has accumulated a fairly thick layer of slough. The periwound skin is intact and there is no obvious sign of infection. 02/07/2022: The wound is a bit larger today. It has thick slough accumulation. The periwound skin is intact and there is no obvious erythema, induration, nor any odor. 02/16/2022: I took a culture last week due to the appearance of the wound and the patient's degree of pain. This grew out methicillin sensitive Staph aureus. Augmentin was prescribed. The  patient states that the pain is markedly improved today. The wound also looks much better. It is smaller with less slough buildup and there is good granulation tissue emerging. 02/21/2022: The patient's pain is quite improved from prior visits. The wound is about the same size and has some accumulated slough on the surface. The base is a little bit fibrotic but there are some areas of granulation tissue filling in. 02/28/2022: The wound is a little bit narrower on its measurements but slightly longer. There is still some slough accumulation on the surface, but he has minimal pain and there is no concern for ongoing infection. Granulation tissue is emerging. 03/07/2022: During the week, he cut holes in his wrap because it was painful and too tight. As a result, his leg was quite a bit more swollen today and his wound was larger. The wound surface is fairly clean  but a little bit dry. Minimal slough accumulation. Granulation tissue present. 03/14/2022: Once again, he did some arts and crafts projects with his wrap. On the other hand, however, his wound does look better. It is smaller today and is beginning to fill in. Minimal slough and a small amount of eschar accumulation. 03/27/2022: His wound measured bigger today, although it does not appear to be so on my impression. The wound continues to fill with granulation tissue. There is a little bit of slough and eschar accumulation. He continues to cut his wrap off of his feet and his edema control at the ankle is particularly poor with 3+ pitting edema. His insurance will not cover any sort of skin substitute unfortunately. Electronic Signature(s) Signed: 03/27/2022 10:26:19 AM By: Anthony Guess MD FACS Entered By: Anthony Barnes on 03/27/2022 10:26:19 -------------------------------------------------------------------------------- Physical Exam Details Patient Name: Date of Service: Anthony Barnes, [name] 03/27/2022 10:00 A M Medical Record Number:  [id number] Patient Account Number: [id number] Date of Birth/Sex: Treating RN: [birth year] (66 y.o. Anthony Barnes Primary Care Provider: Hoy Barnes Other Clinician: Referring Provider: Treating Provider/Extender: Anthony Barnes in Treatment: 12 Constitutional Slightly hypertensive. . . . No acute distress.Marland Kitchen Respiratory Normal work of breathing on room air.. Notes 03/27/2022: His wound measured bigger today, although it does not appear to be so on my impression. The wound continues to fill with granulation tissue. There is a little bit of slough and eschar accumulation. He continues to cut his wrap off of his feet and his edema control at the ankle is particularly poor with 3+ pitting edema. Electronic Signature(s) Signed: 03/27/2022 10:26:58 AM By: Anthony Guess MD FACS Entered By: Anthony Barnes on 03/27/2022 10:26:58 -------------------------------------------------------------------------------- Physician Orders Details Patient Name: Date of Service: Anthony Barnes, [name] 03/27/2022 10:00 A M Medical Record Number: [id number] Patient Account Number: [id number] Date of Birth/Sex: Treating RN: 02/07/1956 (66 y.o. Anthony Barnes Primary Care Provider: Hoy Barnes Other Clinician: Referring Provider: Treating Provider/Extender: Anthony Barnes in Treatment: 12 Verbal / Phone Orders: No Diagnosis Coding ICD-10 Coding Code Description [phone] Non-pressure chronic ulcer of other part of right lower leg with fat layer exposed E11.622 Type 2 diabetes mellitus with other skin ulcer E11.9 Type 2 diabetes mellitus without complications I10 Essential (primary) hypertension I87.2 Venous insufficiency (chronic) (peripheral) Follow-up Appointments ppointment in 1 week. - Dr. Lady Gary - room 2 - 8/22 at 10:45 AM Return A Bathing/ Shower/ Hygiene May shower with protection but do not get wound dressing(s) wet. - may  purchase a cast protector from [facility] or [facility] Edema Control - Lymphedema / SCD / Other Elevate legs to the level of the heart or above for 30 minutes daily and/or when sitting, a frequency of: Avoid standing for long periods of time. Patient to wear own compression stockings every day. Exercise regularly Moisturize legs daily. Compression stocking or Garment 20-30 mm/Hg pressure to: - to L left until R leg is healed Wound Treatment Wound #2 - Lower Leg Wound Laterality: Right, Lateral Cleanser: Soap and Water 1 x Per Week/30 Days Discharge Instructions: May shower and wash wound with dial antibacterial soap and water prior to dressing change. Cleanser: Wound Cleanser 1 x Per Week/30 Days Discharge Instructions: Cleanse the wound with wound cleanser prior to applying a clean dressing using gauze sponges, not tissue or cotton balls. Peri-Wound Care: Sween Lotion (Moisturizing lotion) 1 x Per Week/30 Days Discharge Instructions: Apply moisturizing lotion as directed Prim Dressing: Hydrofera Blue Ready Foam,  4x5 in 1 x Per Week/30 Days ary Discharge Instructions: Apply to wound bed as instructed Secondary Dressing: Woven Gauze Sponge, Non-Sterile 4x4 in 1 x Per Week/30 Days Discharge Instructions: Apply over primary dressing as directed. Secured With: Coban Self-Adherent Wrap 4x5 (in/yd) 1 x Per Week/30 Days Discharge Instructions: Secure with Coban as directed. Secured With: [employer], 4.5x3.1 (in/yd) 1 x Per Week/30 Days Discharge Instructions: Secure with Kerlix as directed. Secured With: 15M Medipore H Soft Cloth Surgical T ape, 4 x 10 (in/yd) 1 x Per Week/30 Days Discharge Instructions: Secure with tape as directed. Electronic Signature(s) Signed: 03/27/2022 10:46:29 AM By: Anthony Guess MD FACS Entered By: Anthony Barnes on 03/27/2022 10:28:00 -------------------------------------------------------------------------------- Problem List Details Patient Name: Date of  Service: Anthony Barnes, [name] 03/27/2022 10:00 A M Medical Record Number: [id number] Patient Account Number: [id number] Date of Birth/Sex: Treating RN: 07-21-1956 (66 y.o. Anthony Barnes Primary Care Provider: Hoy Barnes Other Clinician: Referring Provider: Treating Provider/Extender: Anthony Barnes in Treatment: 12 Active Problems ICD-10 Encounter Code Description Active Date MDM Diagnosis L97.812 Non-pressure chronic ulcer of other part of right lower leg with fat layer 01/02/2022 No Yes exposed E11.622 Type 2 diabetes mellitus with other skin ulcer 01/02/2022 No Yes E11.9 Type 2 diabetes mellitus without complications 01/02/2022 No Yes I10 Essential (primary) hypertension 01/02/2022 No Yes I87.2 Venous insufficiency (chronic) (peripheral) 01/02/2022 No Yes Inactive Problems Resolved Problems Electronic Signature(s) Signed: 03/27/2022 10:24:52 AM By: Anthony Guess MD FACS Entered By: Anthony Barnes on 03/27/2022 10:24:52 -------------------------------------------------------------------------------- Progress Note Details Patient Name: Date of Service: Anthony Barnes, [name] 03/27/2022 10:00 A M Medical Record Number: [id number] Patient Account Number: [id number] Date of Birth/Sex: Treating RN: [birth year] (66 y.o. Anthony Barnes Primary Care Provider: Hoy Barnes Other Clinician: Referring Provider: Treating Provider/Extender: Anthony Barnes in Treatment: 12 Subjective Chief Complaint Information obtained from Patient Right LE Ulcer History of Present Illness (HPI) ADMISSION 03/12/18 This is a 66 year old and it was a type II diabetic reasonably poorly controlled with a recent hemoglobin A1c of 8.2. He was tending to his lawn on 01/15/18 when his weed Anthony Barnes sent a rock up word hitting him in the right anterior leg. He was seen in his primary M.D. office is same time. An x-ray showed no abnormality. He was  given a course of cephalexin. Since then he's been applying peroxide and topical antibiotics to the wound. He does not have a history of chronic wounds however he does have chronic edema in the lower legs. He is complaining of pain in the right leg wound but no real history of claudication. Past medical history includes type 2 diabetes, hypertension, morbid obesity. ABIs in the right leg were noncompressible 03/19/18 on evaluation today patient appears to be tolerating the wrap in general fairly well. He states he's not having that much pain in regard to the wound itself. With that being said he is having some issues with slough buildup on the surface of the wound the Iodoflex does seem to be helpful in that regard. He is still having discomfort he wonders if I can send in a prescription for ibuprofen or something of like to help him out. I do believe that the prox end may be of benefit for him. 03/25/18 on evaluation today patient appears to be doing very well in regard to his right lateral lower [name]. He's been tolerating the dressing changes without complication that is the wraps. Nonetheless he does  continue to have pain he states is been dreading the possibility of having to have debridement again today. His first debridement experience was not optimal. With that being said he does seem to be showing signs of improvement there does appear to be little bit more granulation he does have a lot of slough that really does need to breeding away. 04/04/18;the patient went for arterial studies that were really quite normal. ABI on the right at 1.19 with triphasic waveforms on the left 1.11 with triphasic waveforms. TBI 0.93 on the right and 0.95 on the left. This suggests T should be able to tolerate even 4 layer compression. He is however complaining of a lot of pain with our wraps I'm wondering whether the pain is simply Iodoflex. I changed him to [employer] I'm  still going to try to keep him in 3 layer compression 04/12/18 on evaluation today patient actually appears to be doing rather well in regard to his right lower [name] ulcer. He is making good progress although it is slow still I do believe that the Santyl is much better than the Iodoflex. The good news is the patient seems to be tolerating the dressing changes without complication now that we've gotten good of the Iodoflex which really did burn him quite significantly. Otherwise there's no evidence of infection. 04/17/18 on evaluation today patient actually appears to be showing signs of improvement in regard to the ulcer. Unfortunately he's had somewhat of a rough day due to the fact that he found out that one of his friends suddenly and unexpectedly passed today. He states he's not sure he's in the frame of mind to allow me to debride the wound at this point. Nonetheless I do feel like he is showing signs of the wound getting better little by little each week. 04/24/18 on evaluation today patient's ulcer actually appears to be showing some signs of improvement albeit slow. He has been tolerating the dressing changes without complication except for the wrap which she states was so tight that he had to remove it it was causing a lot of discomfort. Fortunately there is no evidence of infection at this point. He states he only wore the wrap until about the time he got home last week and that he had to take it off. Nonetheless he does not have any other compression stockings, Juxta-Lite wraps, or anything otherwise to really help at this point as far as compression is concerned. 05/01/18 on evaluation today patient actually appears to be doing fairly well in regard to his lower extremity ulcer. He has been tolerating the dressing changes currently without complication. The wrap does seem to be helping as far as the fluid management is concerned. Wound bed itself actually show signs of good improvement  although there is some Slough noted there's not as much as previous and he actually has fairly decent granulation noted as well. Overall I'm pleased with the progress of to this point. The patient would prefer not to have any debridement today he states he's actually not feeling too well in general and he really does not want any additional discomfort typically debridement is fairly uncomfortable for him. 05/08/18 on evaluation today patient actually appears to be doing a little better in regard to his lower extremity ulcer. He has been tolerating the dressing changes without complication. With that being said I'm actually quite pleased with the fact the wound is not appear to be infected and in general has made a little progress.  With that being said I do believe we need to perform some debridement to clean away the necrotic tissue on the surface of the wound. 05/15/18 on evaluation today patient actually appears to be doing a little better in regard to his wound. Fortunately there is some improvement noted fortunately there's also no significant evidence of infection at this point in time. He does have continued pain that really nothing different than previous he did get his Juxta-Lite wrap. 05/29/18 on evaluation today patient actually appears to be doing somewhat better in regard to his wound currently. He's been tolerating the dressing changes without complication. With that being said he has been performing the [facility] changes every day at home. I'm pretty sure that we told him every other day last week or when I saw him rather two Barnes ago. With that being said obviously did not hurt to do it daily just that obviously is gonna run out of supplies sooner and that could get him into trouble as far as his insurance is concerned. Nonetheless he at this point still continues to have pain although honestly I don't feel like it's as bad as what it was in the past just based on his  reactions at this point. 06/12/18 on evaluation today patient actually appears to be doing better in regard to his lower [name] ulcer. He's been tolerating the dressing changes without complication. Fortunately there does not appear to be any evidence of infection at this time. No fevers, chills, nausea, or vomiting noted at this time. 06/19/18 evaluation today patient's ulcer on the lower extremity appears to be doing better at this point. he has been tolerating the dressing changes. The patient seems to be somewhat depressed about the progress of his wound although the overall appearance at this time seems to be doing well. In general I'm very pleased with the appearance today he does have some biofilm on the surface of the wound as well as of hyper granular tissue we may want to utilize a little bit of silver nitrate today. 06/27/2018; patient comes into clinic today for a wound on the right lateral calf likely related to chronic venous insufficiency. He has been using medihoney covered with Hydrofera Blue. Wound surface actually looks quite good 07/03/2018 seen today for follow-up and management of lower extremity ulcer right lateral shin. T olerating dressing changes. He has a small amount of biofilm today. Will attempt to remove a portion of the biofilm as he tolerates. He becomes very anxious with wound treatments. He would benefit from layer compression wraps however he refuses compression wraps or anything that smokes his legs. He expressed an inability to tolerate compression wraps. Juxta lite wraps have been ordered. He has been instructed on the appropriate application of the juxta leg wraps. His blood pressure today on visit was 200/120. He denies any blurred vision, chest pain, dizziness, shortness of breath, or difficulty with mobility. Mr. Loistine SimasGladney stated that he had a recent visit with his primary care provider. At that time his carvedilol was decreased from 25 mg daily to 12.5 mg  daily. Strongly encouraged Mr. Loistine SimasGladney to seek medical attention today during visit. He declined transport for evaluation of elevated blood pressure. Encouraged him to contact his primary care provider today for medication management. He stated that he would call his primary care doctor after wound visit today. Also encouraged him that if he experienced any symptoms of blurred vision, chest pain, shortness of breath to immediately seek medical attention. 07/17/18 on  evaluation today patient's wound actually does seem to show signs of improvement based on the overall appearance of the wound bed today. There does not appear to show any signs of infection which is good news. There is no overall worsening which is also good news. He still has hyper granular tissue will use in the [employer] followed by [facility] Dressing this point. 07/31/18 on evaluation today patient's wound bed actually show signs of improvement with good epithelialization especially in the upper portion of the wound. He has been tolerating the dressing changes without complication in general. Overall I'm extremely happy with how things stand. No fevers, chills, nausea, or vomiting noted at this time. 08/28/18 on evaluation today patient appears to be doing a little better in regard to his lower extremity ulcer. With that being said it appears to be very hyper granular I think this is directly attributed to the fact that he continues to use the [employer] underneath the [facility] Dressing. Although this helps not to stick unfortunately also think he's not getting the benefit of the [facility] Dressing particular and subsequently is causing too much moisture buildup hence the hyper granulation. Fortunately there does not appear to be any signs of infection at this time which is good news. 09/03/18 on evaluation today patient appears to be doing well in regard to his lower [name] ulcer. He has been tolerating the dressing changes  without complication. Fortunately he has much less hyper granular tissue the noted last week I do think using silver nitrate in changing to using the [facility] Dressing without the mepitel has made a big difference for him this is good news. 09/18/18 on evaluation today patient appears to be doing excellent in regard to his right lower [name] ulcer. This is actually significantly smaller even compared to the last evaluation. Overall I'm very pleased in this regard. I'm a recommend that we likely repeat the silver nitrate today based on what I'm seeing. 10/02/18 on evaluation today patient appears to be doing excellent in regard to his lower extremity wound on the right. He's been tolerating the dressing changes without complication. Fortunately there's no signs of infection at this time. Overall been very pleased with how things seem to be progressing. No fevers, chills, nausea, or vomiting noted at this time. 10/16/18 on evaluation today patient actually appears to be doing much better in regard to his lower extremity wound on the right. This is shown signs of good improvement and is indeed measuring smaller he is on a excellent track as far as healing is concerned. My hope is this will be healed of the next several Barnes. Fortunately there is no evidence of infection at this time. He does seem to be doing everything that I'm recommending for him 10/30/18 on evaluation today patient appears to be doing better in regard to his lower extremity ulcer. He's been tolerating the dressing changes without complication. Fortunately the Hydrofera Blue Dressing to be doing the job. He has made excellent progress. With that being said this is still going somewhat slow but nonetheless is always making good progress at this point. 5/18-Patient was in to be seen for right leg ulcer that appeared after scab above it was removed today. This area had healed completely. It appears that no further action is required on  this READMISSION 01/02/2022 This is a now 66 year old male with poorly controlled type 2 diabetes mellitus and chronic venous insufficiency who once again was performing lawn care when a rock was flung  up by his weedeater and it struck him in the right lateral lower leg. The wound has not healed though it has been nearly 2 months since the injury occurred. He was seen in the emergency department at [facility] on May 9. At that time it was very painful and he described it as a "15 on a scale of 0oo10". He also was found to have 3+ pitting edema to the bilateral lower extremities. He was prescribed a Barnes course of Keflex. He is here today because the wound has failed to heal and he has a prior history in our clinic. ABI in clinic today was noncompressible. He did have formal vascular studies performed in 2019 which were normal. The last hemoglobin A1c I have available for review was from March 21, 2021 and was elevated at 7.7%. The wound is fairly small and circular located about 3 inches above his right lateral malleolus. It is completely covered with eschar. No surrounding erythema, no odor, no purulent drainage. 01/11/2022: The wound on his right lateral leg is a little bit smaller today. It has reaccumulated some slough and a small bit of eschar. It remains fairly painful. He has another area on his anterior tibia that he will not let me touch but I am concerned that there is a wound forming underneath the surface. 01/18/2022: Unfortunately, his wound is a little bit bigger today. He continues to accumulate slough on the wound surface. It is still quite tender. 01/25/2022: The patient bumped his wound on the car door which resulted in bleeding. He was seen at urgent care where it was redressed. Unfortunately, the wound is bigger again today. There is accumulated slough on the surface. Urgent care gave him some tramadol, apparently and he took this prior to his visit so he could tolerate debridement  better. 02/02/2022: The wound is a little bit smaller today. The surface, however, has accumulated a fairly thick layer of slough. The periwound skin is intact and there is no obvious sign of infection. 02/07/2022: The wound is a bit larger today. It has thick slough accumulation. The periwound skin is intact and there is no obvious erythema, induration, nor any odor. 02/16/2022: I took a culture last week due to the appearance of the wound and the patient's degree of pain. This grew out methicillin sensitive Staph aureus. Augmentin was prescribed. The patient states that the pain is markedly improved today. The wound also looks much better. It is smaller with less slough buildup and there is good granulation tissue emerging. 02/21/2022: The patient's pain is quite improved from prior visits. The wound is about the same size and has some accumulated slough on the surface. The base is a little bit fibrotic but there are some areas of granulation tissue filling in. 02/28/2022: The wound is a little bit narrower on its measurements but slightly longer. There is still some slough accumulation on the surface, but he has minimal pain and there is no concern for ongoing infection. Granulation tissue is emerging. 03/07/2022: During the week, he cut holes in his wrap because it was painful and too tight. As a result, his leg was quite a bit more swollen today and his wound was larger. The wound surface is fairly clean but a little bit dry. Minimal slough accumulation. Granulation tissue present. 03/14/2022: Once again, he did some arts and crafts projects with his wrap. On the other hand, however, his wound does look better. It is smaller today and is beginning to fill in. Minimal  slough and a small amount of eschar accumulation. 03/27/2022: His wound measured bigger today, although it does not appear to be so on my impression. The wound continues to fill with granulation tissue. There is a little bit of slough and  eschar accumulation. He continues to cut his wrap off of his feet and his edema control at the ankle is particularly poor with 3+ pitting edema. His insurance will not cover any sort of skin substitute unfortunately. Patient History Information obtained from Patient. Family History Cancer - Mother, Diabetes - Mother,Father, Hypertension - Mother,Father, Kidney Disease - Siblings, Stroke - Father, No family history of Heart Disease, Hereditary Spherocytosis, Lung Disease, Seizures, Thyroid Problems, Tuberculosis. Social History Former smoker - ended on 08/14/1990, Marital Status - Married, Alcohol Use - Moderate, Drug Use - No History, Caffeine Use - Daily. Medical History Eyes Denies history of Cataracts, Glaucoma, Optic Neuritis Ear/Nose/Mouth/Throat Denies history of Chronic sinus problems/congestion, Middle ear problems Hematologic/Lymphatic Denies history of Anemia, Hemophilia, Human Immunodeficiency Virus, Lymphedema, Sickle Cell Disease Respiratory Denies history of Aspiration, Asthma, Chronic Obstructive Pulmonary Disease (COPD), Pneumothorax, Sleep Apnea, Tuberculosis Cardiovascular Patient has history of Hypertension Denies history of Angina, Arrhythmia, Congestive Heart Failure, Coronary Artery Disease, Deep Vein Thrombosis, Hypotension, Myocardial Infarction, Peripheral Arterial Disease, Peripheral Venous Disease, Phlebitis, Vasculitis Gastrointestinal Denies history of Cirrhosis , Colitis, Crohnoos, Hepatitis A, Hepatitis B, Hepatitis C Endocrine Patient has history of Type II Diabetes Genitourinary Denies history of End Stage Renal Disease Immunological Denies history of Lupus Erythematosus, Raynaudoos, Scleroderma Integumentary (Skin) Denies history of History of Burn Musculoskeletal Denies history of Gout, Rheumatoid Arthritis, Osteoarthritis, Osteomyelitis Neurologic Denies history of Dementia, Neuropathy, Quadriplegia, Paraplegia, Seizure  Disorder Oncologic Denies history of Received Chemotherapy, Received Radiation Psychiatric Denies history of Anorexia/bulimia, Confinement Anxiety Hospitalization/Surgery History - esophagogastroduodenoscopy. - brain aneurysm surgery. Medical A Surgical History Notes nd Constitutional Symptoms (General Health) obesity Gastrointestinal diverticulitis Neurologic neuropathy Objective Constitutional Slightly hypertensive. No acute distress.. Vitals Time Taken: 10:03 AM, Height: 75 in, Weight: 310 lbs, BMI: 38.7, Temperature: 98 F, Pulse: 77 bpm, Respiratory Rate: 18 breaths/min, Blood Pressure: 148/97 mmHg, Capillary Blood Glucose: 170 mg/dl. Respiratory Normal work of breathing on room air.. General Notes: 03/27/2022: His wound measured bigger today, although it does not appear to be so on my impression. The wound continues to fill with granulation tissue. There is a little bit of slough and eschar accumulation. He continues to cut his wrap off of his feet and his edema control at the ankle is particularly poor with 3+ pitting edema. Integumentary (Hair, Skin) Wound #2 status is Open. Original cause of wound was Trauma. The date acquired was: 12/03/2021. The wound has been in treatment 12 Barnes. The wound is located on the Right,Lateral Lower Leg. The wound measures 4.9cm length x 1.8cm width x 0.1cm depth; 6.927cm^2 area and 0.693cm^3 volume. There is Fat Layer (Subcutaneous Tissue) exposed. There is no tunneling or undermining noted. There is a medium amount of serosanguineous drainage noted. The wound margin is distinct with the outline attached to the wound base. There is large (67-100%) red granulation within the wound bed. There is a small (1-33%) amount of necrotic tissue within the wound bed including Adherent Slough. Assessment Active Problems ICD-10 Non-pressure chronic ulcer of other part of right lower leg with fat layer exposed Type 2 diabetes mellitus with other skin  ulcer Type 2 diabetes mellitus without complications Essential (primary) hypertension Venous insufficiency (chronic) (peripheral) Procedures Wound #2 Pre-procedure diagnosis of Wound #2 is a Venous Leg Ulcer  located on the Right,Lateral Lower Leg .Severity of Tissue Pre Debridement is: Fat layer exposed. There was a Excisional Skin/Subcutaneous Tissue Debridement with a total area of 8.82 sq cm performed by Anthony Guess, MD. With the following instrument(s): Curette to remove Non-Viable tissue/material. Material removed includes Subcutaneous Tissue and Slough and after achieving pain control using Lidocaine 5% topical ointment. No specimens were taken. A time out was conducted at 10:15, prior to the start of the procedure. A Minimum amount of bleeding was controlled with Pressure. The procedure was tolerated well with a pain level of 0 throughout and a pain level of 0 following the procedure. Post Debridement Measurements: 4.9cm length x 1.8cm width x 0.1cm depth; 0.693cm^3 volume. Character of Wound/Ulcer Post Debridement is improved. Severity of Tissue Post Debridement is: Fat layer exposed. Post procedure Diagnosis Wound #2: Same as Pre-Procedure Plan Follow-up Appointments: Return Appointment in 1 week. - Dr. Lady Gary - room 2 - 8/22 at 10:45 AM Bathing/ Shower/ Hygiene: May shower with protection but do not get wound dressing(s) wet. - may purchase a cast protector from [facility] or [facility] Edema Control - Lymphedema / SCD / Other: Elevate legs to the level of the heart or above for 30 minutes daily and/or when sitting, a frequency of: Avoid standing for long periods of time. Patient to wear own compression stockings every day. Exercise regularly Moisturize legs daily. Compression stocking or Garment 20-30 mm/Hg pressure to: - to L left until R leg is healed WOUND #2: - Lower Leg Wound Laterality: Right, Lateral Cleanser: Soap and Water 1 x Per Week/30 Days Discharge Instructions: May  shower and wash wound with dial antibacterial soap and water prior to dressing change. Cleanser: Wound Cleanser 1 x Per Week/30 Days Discharge Instructions: Cleanse the wound with wound cleanser prior to applying a clean dressing using gauze sponges, not tissue or cotton balls. Peri-Wound Care: Sween Lotion (Moisturizing lotion) 1 x Per Week/30 Days Discharge Instructions: Apply moisturizing lotion as directed Prim Dressing: Hydrofera Blue Ready Foam, 4x5 in 1 x Per Week/30 Days ary Discharge Instructions: Apply to wound bed as instructed Secondary Dressing: Woven Gauze Sponge, Non-Sterile 4x4 in 1 x Per Week/30 Days Discharge Instructions: Apply over primary dressing as directed. Secured With: Coban Self-Adherent Wrap 4x5 (in/yd) 1 x Per Week/30 Days Discharge Instructions: Secure with Coban as directed. Secured With: [employer], 4.5x3.1 (in/yd) 1 x Per Week/30 Days Discharge Instructions: Secure with Kerlix as directed. Secured With: 77M Medipore H Soft Cloth Surgical T ape, 4 x 10 (in/yd) 1 x Per Week/30 Days Discharge Instructions: Secure with tape as directed. 03/27/2022: His wound measured bigger today, although it does not appear to be so on my impression. The wound continues to fill with granulation tissue. There is a little bit of slough and eschar accumulation. He continues to cut his wrap off of his feet and his edema control at the ankle is particularly poor with 3+ pitting edema. I used a curette to debride slough and eschar from his wound. I am going to continue using the [facility] ready foam for another week, after which we may switch to collagen. He was advised to keep his wraps on as the edema at his ankle is probably a contributing factor to why he is not healing. We also discussed the importance of blood sugar control; I told him to try to keep his sugar around 150 at the highest. Follow-up in 1 week. Electronic Signature(s) Signed: 03/27/2022 10:29:01 AM By:  Anthony Guess MD  FACS Entered By: Anthony Barnes on 03/27/2022 10:29:01 -------------------------------------------------------------------------------- HxROS Details Patient Name: Date of Service: Anthony Barnes [name] 03/27/2022 10:00 A M Medical Record Number: [id number] Patient Account Number: [id number] Date of Birth/Sex: Treating RN: 1956-01-06 (66 y.o. Anthony Barnes Primary Care Provider: Hoy Barnes Other Clinician: Referring Provider: Treating Provider/Extender: Anthony Barnes in Treatment: 12 Information Obtained From Patient Constitutional Symptoms (General Health) Medical History: Past Medical History Notes: obesity Eyes Medical History: Negative for: Cataracts; Glaucoma; Optic Neuritis Ear/Nose/Mouth/Throat Medical History: Negative for: Chronic sinus problems/congestion; Middle ear problems Hematologic/Lymphatic Medical History: Negative for: Anemia; Hemophilia; Human Immunodeficiency Virus; Lymphedema; Sickle Cell Disease Respiratory Medical History: Negative for: Aspiration; Asthma; Chronic Obstructive Pulmonary Disease (COPD); Pneumothorax; Sleep Apnea; Tuberculosis Cardiovascular Medical History: Positive for: Hypertension Negative for: Angina; Arrhythmia; Congestive Heart Failure; Coronary Artery Disease; Deep Vein Thrombosis; Hypotension; Myocardial Infarction; Peripheral Arterial Disease; Peripheral Venous Disease; Phlebitis; Vasculitis Gastrointestinal Medical History: Negative for: Cirrhosis ; Colitis; Crohns; Hepatitis A; Hepatitis B; Hepatitis C Past Medical History Notes: diverticulitis Endocrine Medical History: Positive for: Type II Diabetes Time with diabetes: 8 years Treated with: Oral agents Blood sugar tested every day: No Genitourinary Medical History: Negative for: End Stage Renal Disease Immunological Medical History: Negative for: Lupus Erythematosus; Raynauds; Scleroderma Integumentary  (Skin) Medical History: Negative for: History of Burn Musculoskeletal Medical History: Negative for: Gout; Rheumatoid Arthritis; Osteoarthritis; Osteomyelitis Neurologic Medical History: Negative for: Dementia; Neuropathy; Quadriplegia; Paraplegia; Seizure Disorder Past Medical History Notes: neuropathy Oncologic Medical History: Negative for: Received Chemotherapy; Received Radiation Psychiatric Medical History: Negative for: Anorexia/bulimia; Confinement Anxiety Immunizations Pneumococcal Vaccine: Received Pneumococcal Vaccination: No Immunization Notes: tetanus 2 years ago Implantable Devices None Hospitalization / Surgery History Type of Hospitalization/Surgery esophagogastroduodenoscopy brain aneurysm surgery Family and Social History Cancer: Yes - Mother; Diabetes: Yes - Mother,Father; Heart Disease: No; Hereditary Spherocytosis: No; Hypertension: Yes - Mother,Father; Kidney Disease: Yes - Siblings; Lung Disease: No; Seizures: No; Stroke: Yes - Father; Thyroid Problems: No; Tuberculosis: No; Former smoker - ended on 08/14/1990; Marital Status - Married; Alcohol Use: Moderate; Drug Use: No History; Caffeine Use: Daily; Financial Concerns: No; Food, Clothing or Shelter Needs: No; Support System Lacking: No; Transportation Concerns: No Electronic Signature(s) Signed: 03/27/2022 10:46:29 AM By: Anthony Guess MD FACS Signed: 03/27/2022 4:58:09 PM By: Anthony Barnes Entered By: Anthony Barnes on 03/27/2022 10:26:29 -------------------------------------------------------------------------------- SuperBill Details Patient Name: Date of Service: Anthony Barnes, [name] 03/27/2022 Medical Record Number: [id number] Patient Account Number: [id number] Date of Birth/Sex: Treating RN: [birth year] (66 y.o. Anthony Barnes Primary Care Provider: Hoy Barnes Other Clinician: Referring Provider: Treating Provider/Extender: Anthony Barnes in  Treatment: 12 Diagnosis Coding ICD-10 Codes Code Description [id number] Non-pressure chronic ulcer of other part of right lower leg with fat layer exposed E11.622 Type 2 diabetes mellitus with other skin ulcer E11.9 Type 2 diabetes mellitus without complications I10 Essential (primary) hypertension I87.2 Venous insufficiency (chronic) (peripheral) Facility Procedures CPT4 Code: 32440102 Description: 11042 - DEB SUBQ TISSUE 20 SQ CM/< ICD-10 Diagnosis Description 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 Modifier: osed Quantity: 1 Physician Procedures : CPT4 Code Description Modifier [id number] 99213 - WC PHYS LEVEL 3 - EST PT 25 ICD-10 Diagnosis Description 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osed E11.622 Type 2 diabetes mellitus with other skin ulcer  I87.2 Venous insufficiency (chronic) (peripheral) I10 Essential (primary) hypertension Quantity: 1 : [id number] 11042 - WC PHYS SUBQ  TISS 20 SQ CM ICD-10 Diagnosis Description 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osed Quantity: 1 Electronic Signature(s) Signed: 03/27/2022 10:29:31 AM By: Anthony Guess MD FACS Entered By: Anthony Barnes on 03/27/2022 10:29:31

## 2022-03-31 NOTE — Progress Notes (Signed)
SAMIL, MECHAM ([id number]) Visit Report for 03/27/2022 Arrival Information Details Patient Name: Date of Service: Anthony Barnes [name] 03/27/2022 10:00 A M Medical Record Number: [id number] Patient Account Number: [id number] Date of Birth/Sex: Treating RN: 02-12-1956 (66 y.o. Anthony Barnes Primary Care Leonia Heatherly: Charlott Rakes Other Clinician: Referring Kailana Benninger: Treating Adiah Guereca/Extender: Carter Kitten Weeks in Treatment: 12 Visit Information History Since Last Visit Added or deleted any medications: No Patient Arrived: Ambulatory Any new allergies or adverse reactions: No Arrival Time: 10:00 Had a fall or experienced change in No Accompanied By: self activities of daily living that may affect Transfer Assistance: None risk of falls: Patient Identification Verified: Yes Signs or symptoms of abuse/neglect since last visito No Secondary Verification Process Completed: Yes Hospitalized since last visit: No Patient Requires Transmission-Based Precautions: No Implantable device outside of the clinic excluding No Patient Has Alerts: No cellular tissue based products placed in the center since last visit: Has Dressing in Place as Prescribed: Yes Has Compression in Place as Prescribed: Yes Pain Present Now: Yes Electronic Signature(s) Signed: 03/27/2022 4:58:09 PM By: Adline Peals Entered By: Adline Peals on 03/27/2022 10:03:36 -------------------------------------------------------------------------------- Lower Extremity Assessment Details Patient Name: Date of Service: Anthony Barnes [name] 03/27/2022 10:00 A M Medical Record Number: [id number] Patient Account Number: [id number] Date of Birth/Sex: Treating RN: November 28, 1955 (66 y.o. Anthony Barnes Primary Care Elisabetta Mishra: Charlott Rakes Other Clinician: Referring [place] Inthavong: Treating Florance Paolillo/Extender: Carter Kitten Weeks in Treatment: 12 Edema  Assessment Assessed: [Left: No] [Right: No] [Left: Edema] [Right: :] Calf Left: Right: Point of Measurement: From Medial Instep 42 cm Ankle Left: Right: Point of Measurement: From Medial Instep 28.8 cm Vascular Assessment Pulses: Dorsalis Pedis Palpable: [Right:Yes] Electronic Signature(s) Signed: 03/27/2022 4:58:09 PM By: Adline Peals Entered By: Adline Peals on 03/27/2022 10:08:24 -------------------------------------------------------------------------------- Multi Wound Chart Details Patient Name: Date of Service: Anthony Barnes, Anthony [name] 03/27/2022 10:00 A M Medical Record Number: [id number] Patient Account Number: [id number] Date of Birth/Sex: Treating RN: 1956/03/11 (66 y.o. Anthony Barnes Primary Care Zaylyn Bergdoll: Charlott Rakes Other Clinician: Referring Lizandra Zakrzewski: Treating Lesley Galentine/Extender: Carter Kitten Weeks in Treatment: 12 Vital Signs Height(in): 75 Capillary Blood Glucose(mg/dl): 170 Weight(lbs): 310 Pulse(bpm): 77 Body Mass Index(BMI): 38.7 Blood Pressure(mmHg): 148/97 Temperature(F): 98 Respiratory Rate(breaths/min): 18 Photos: [N/A:N/A] Right, Lateral Lower Leg N/A N/A Wound Location: Trauma N/A N/A Wounding Event: Venous Leg Ulcer N/A N/A Primary Etiology: Hypertension, Type II Diabetes N/A N/A Comorbid History: 12/03/2021 N/A N/A Date Acquired: 12 N/A N/A Weeks of Treatment: Open N/A N/A Wound Status: No N/A N/A Wound Recurrence: 4.9x1.8x0.1 N/A N/A Measurements L x W x D (cm) 6.927 N/A N/A A (cm) : rea 0.693 N/A N/A Volume (cm) : -553.50% N/A N/A % Reduction in A rea: -553.80% N/A N/A % Reduction in Volume: Full Thickness Without Exposed N/A N/A Classification: Support Structures Medium N/A N/A Exudate A mount: Serosanguineous N/A N/A Exudate Type: red, brown N/A N/A Exudate Color: Distinct, outline attached N/A N/A Wound Margin: Large (67-100%) N/A N/A Granulation A mount: Red N/A  N/A Granulation Quality: Small (1-33%) N/A N/A Necrotic A mount: Fat Layer (Subcutaneous Tissue): Yes N/A N/A Exposed Structures: Fascia: No Tendon: No Muscle: No Joint: No Bone: No Small (1-33%) N/A N/A Epithelialization: Debridement - Excisional N/A N/A Debridement: Pre-procedure Verification/Time Out 10:15 N/A N/A Taken: Lidocaine 5% topical ointment N/A N/A Pain Control: Subcutaneous, Slough N/A N/A Tissue Debrided: Skin/Subcutaneous Tissue N/A N/A Level: 8.82 N/A N/A Debridement A (sq cm): rea Curette N/A  N/A Instrument: Minimum N/A N/A Bleeding: Pressure N/A N/A Hemostasis Achieved: 0 N/A N/A Procedural Pain: 0 N/A N/A Post Procedural Pain: Procedure was tolerated well N/A N/A Debridement Treatment Response: 4.9x1.8x0.1 N/A N/A Post Debridement Measurements L x W x D (cm) 0.693 N/A N/A Post Debridement Volume: (cm) Debridement N/A N/A Procedures Performed: Treatment Notes Electronic Signature(s) Signed: 03/27/2022 10:24:58 AM By: Fredirick Maudlin MD FACS Signed: 03/27/2022 4:58:09 PM By: Adline Peals Entered By: Fredirick Maudlin on 03/27/2022 10:24:58 -------------------------------------------------------------------------------- [place] Details Patient Name: Date of Service: Anthony Barnes, Anthony [name] 03/27/2022 10:00 A M Medical Record Number: [id number] Patient Account Number: [id number] Date of Birth/Sex: Treating RN: 05-29-1956 (66 y.o. Anthony Barnes Primary Care Merrie Epler: Charlott Rakes Other Clinician: Referring Olof Marcil: Treating Reubin Bushnell/Extender: Carter Kitten Weeks in Treatment: 12 Active Inactive Venous Leg Ulcer Nursing Diagnoses: Actual venous Insuffiency (use after diagnosis is confirmed) Knowledge deficit related to disease process and management Goals: Patient will maintain optimal edema control Date Initiated: 01/02/2022 Target Resolution Date: 04/14/2022 Goal Status:  Active Patient/caregiver will verbalize understanding of disease process and disease management Date Initiated: 01/02/2022 Date Inactivated: 03/07/2022 Target Resolution Date: 03/09/2022 Goal Status: Met Interventions: Assess peripheral edema status every visit. Compression as ordered Provide education on venous insufficiency Treatment Activities: Non-invasive vascular studies : 01/02/2022 T ordered outside of clinic : 01/02/2022 est Therapeutic compression applied : 01/02/2022 Notes: Wound/Skin Impairment Nursing Diagnoses: Impaired tissue integrity Knowledge deficit related to ulceration/compromised skin integrity Goals: Patient/caregiver will verbalize understanding of skin care regimen Date Initiated: 01/02/2022 Date Inactivated: 02/02/2022 Target Resolution Date: 02/03/2022 Goal Status: Met Ulcer/skin breakdown will have a volume reduction of 30% by week 4 Date Initiated: 01/02/2022 Target Resolution Date: 04/14/2022 Goal Status: Active Interventions: Assess ulceration(s) every visit Provide education on ulcer and skin care Treatment Activities: Skin care regimen initiated : 01/02/2022 Topical wound management initiated : 01/02/2022 Notes: Electronic Signature(s) Signed: 03/27/2022 4:58:09 PM By: Adline Peals Entered By: Adline Peals on 03/27/2022 10:10:55 -------------------------------------------------------------------------------- Pain Assessment Details Patient Name: Date of Service: Anthony Barnes, Anthony [name] 03/27/2022 10:00 A M Medical Record Number: [id number] Patient Account Number: [id number] Date of Birth/Sex: Treating RN: [birth year] (66 y.o. Anthony Barnes Primary Care Mansel Strother: Charlott Rakes Other Clinician: Referring Laddie Math: Treating Ashana Tullo/Extender: Carter Kitten Weeks in Treatment: 12 Active Problems Location of Pain Severity and Description of Pain Patient Has Paino Yes Site Locations Pain Location: Generalized  Pain Duration of the Pain. Constant / Intermittento Intermittent Rate the pain. Current Pain Level: 5 Character of Pain Describe the Pain: Other: neuropathy Pain Management and Medication Current Pain Management: Electronic Signature(s) Signed: 03/27/2022 4:58:09 PM By: Adline Peals Entered By: Adline Peals on 03/27/2022 10:04:07 -------------------------------------------------------------------------------- Patient/Caregiver Education Details Patient Name: Date of Service: Anthony Barnes, Anthony [name] 8/14/2023andnbsp10:00 A M Medical Record Number: [id number] Patient Account Number: [id number] Date of Birth/Gender: Treating RN: 1955-09-24 (66 y.o. Anthony Barnes Primary Care Physician: Charlott Rakes Other Clinician: Referring Physician: Treating Physician/Extender: Carter Kitten Weeks in Treatment: 12 Education Assessment Education Provided To: Patient Education Topics Provided Wound/Skin Impairment: Methods: Explain/Verbal Responses: Reinforcements needed, State content correctly Electronic Signature(s) Signed: 03/27/2022 4:58:09 PM By: Adline Peals Entered By: Adline Peals on 03/27/2022 10:11:16 -------------------------------------------------------------------------------- Wound Assessment Details Patient Name: Date of Service: Anthony Barnes [name] 03/27/2022 10:00 A M Medical Record Number: [id number] Patient Account Number: [id number] Date of Birth/Sex: Treating RN: 04-Sep-1955 (66 y.o. Anthony Barnes Primary Care Joelly Bolanos: Charlott Rakes Other Clinician: Referring Deserai Cansler: Treating  Barack Nicodemus/Extender: Carter Kitten Weeks in Treatment: 12 Wound Status Wound Number: 2 Primary Etiology: Venous Leg Ulcer Wound Location: Right, Lateral Lower Leg Wound Status: Open Wounding Event: Trauma Comorbid History: Hypertension, Type II Diabetes Date Acquired: 12/03/2021 Weeks Of Treatment:  12 Clustered Wound: No Photos Wound Measurements Length: (cm) 4.9 Width: (cm) 1.8 Depth: (cm) 0.1 Area: (cm) 6.927 Volume: (cm) 0.693 % Reduction in Area: -553.5% % Reduction in Volume: -553.8% Epithelialization: Small (1-33%) Tunneling: No Undermining: No Wound Description Classification: Full Thickness Without Exposed Support Structures Wound Margin: Distinct, outline attached Exudate Amount: Medium Exudate Type: Serosanguineous Exudate Color: red, brown Foul Odor After Cleansing: No Slough/Fibrino Yes Wound Bed Granulation Amount: Large (67-100%) Exposed Structure Granulation Quality: Red Fascia Exposed: No Necrotic Amount: Small (1-33%) Fat Layer (Subcutaneous Tissue) Exposed: Yes Necrotic Quality: Adherent Slough Tendon Exposed: No Muscle Exposed: No Joint Exposed: No Bone Exposed: No Electronic Signature(s) Signed: 03/27/2022 4:58:09 PM By: Adline Peals Entered By: Adline Peals on 03/27/2022 10:09:48 -------------------------------------------------------------------------------- Vitals Details Patient Name: Date of Service: Anthony Barnes, Anthony [name] 03/27/2022 10:00 A M Medical Record Number: [id number] Patient Account Number: [id number] Date of Birth/Sex: Treating RN: 11-01-1955 (66 y.o. Anthony Barnes Primary Care Ariatna Jester: Charlott Rakes Other Clinician: Referring Bennett Vanscyoc: Treating Sherman Lipuma/Extender: Carter Kitten Weeks in Treatment: 12 Vital Signs Time Taken: 10:03 Temperature (F): 98 Height (in): 75 Pulse (bpm): 77 Weight (lbs): 310 Respiratory Rate (breaths/min): 18 Body Mass Index (BMI): 38.7 Blood Pressure (mmHg): 148/97 Capillary Blood Glucose (mg/dl): 170 Reference Range: 80 - 120 mg / dl Electronic Signature(s) Signed: 03/27/2022 4:58:09 PM By: Adline Peals Entered By: Adline Peals on 03/27/2022 10:14:54

## 2022-04-04 ENCOUNTER — Encounter (HOSPITAL_BASED_OUTPATIENT_CLINIC_OR_DEPARTMENT_OTHER): Payer: Medicare Other | Admitting: General Surgery

## 2022-04-04 DIAGNOSIS — I872 Venous insufficiency (chronic) (peripheral): Secondary | ICD-10-CM | POA: Diagnosis not present

## 2022-04-04 DIAGNOSIS — E11622 Type 2 diabetes mellitus with other skin ulcer: Secondary | ICD-10-CM | POA: Diagnosis not present

## 2022-04-04 DIAGNOSIS — I10 Essential (primary) hypertension: Secondary | ICD-10-CM | POA: Diagnosis not present

## 2022-04-04 DIAGNOSIS — L97812 Non-pressure chronic ulcer of other part of right lower leg with fat layer exposed: Secondary | ICD-10-CM | POA: Diagnosis not present

## 2022-04-04 DIAGNOSIS — Z6838 Body mass index (BMI) 38.0-38.9, adult: Secondary | ICD-10-CM | POA: Diagnosis not present

## 2022-04-06 ENCOUNTER — Telehealth: Payer: Self-pay

## 2022-04-06 NOTE — Telephone Encounter (Signed)
LVM to schedule AWV.

## 2022-04-10 NOTE — Progress Notes (Signed)
Anthony, Barnes ([id number]) Visit Report for 04/04/2022 Chief Complaint Document Details Patient Name: Date of Service: Anthony Barnes [name] 04/04/2022 10:45 A M Medical Record Number: [id number] Patient Account Number: [id number] Date of Birth/Sex: Treating RN: 11/26/1955 (66 y.o. [name] Primary Care Provider: Hoy Register Other Clinician: Referring Provider: Treating Provider/Extender: Camillo Flaming Weeks in Treatment: 13 Information Obtained from: Patient Chief Complaint Right LE Ulcer Electronic Signature(s) Signed: 04/04/2022 11:29:56 AM By: Duanne Guess MD FACS Entered By: Duanne Guess on 04/04/2022 11:29:56 -------------------------------------------------------------------------------- Debridement Details Patient Name: Date of Service: Anthony Barnes, Anthony [name] 04/04/2022 10:45 A M Medical Record Number: [id number] Patient Account Number: [id number] Date of Birth/Sex: Treating RN: 14-May-1956 (66 y.o. Valma Cava Primary Care Provider: Hoy Register Other Clinician: Referring Provider: Treating Provider/Extender: Camillo Flaming Weeks in Treatment: 13 Debridement Performed for Assessment: Wound #2 Right,Lateral Lower Leg Performed By: Physician Duanne Guess, MD Debridement Type: Debridement Severity of Tissue Pre Debridement: Fat layer exposed Level of Consciousness (Pre-procedure): Awake and Alert Pre-procedure Verification/Time Out Yes - 11:05 Taken: Start Time: 11:06 Pain Control: Lidocaine 5% topical ointment T Area Debrided (L x W): otal 4.8 (cm) x 1.6 (cm) = 7.68 (cm) Tissue and other material debrided: Non-Viable, Slough, Slough Level: Non-Viable Tissue Debridement Description: Selective/Open Wound Instrument: Curette Bleeding: Minimum Hemostasis Achieved: Pressure Procedural Pain: 0 Post Procedural Pain: 0 Response to Treatment: Procedure was tolerated well Level of Consciousness  (Post- Awake and Alert procedure): Post Debridement Measurements of Total Wound Length: (cm) 4.8 Width: (cm) 1.6 Depth: (cm) 0.2 Volume: (cm) 1.206 Character of Wound/Ulcer Post Debridement: Improved Severity of Tissue Post Debridement: Fat layer exposed Post Procedure Diagnosis Same as Pre-procedure Electronic Signature(s) Signed: 04/04/2022 12:10:29 PM By: Duanne Guess MD FACS Signed: 04/04/2022 4:32:12 PM By: Tommie Ard RN Entered By: Tommie Ard on 04/04/2022 11:07:34 -------------------------------------------------------------------------------- HPI Details Patient Name: Date of Service: Anthony Barnes, Anthony [name] 04/04/2022 10:45 A M Medical Record Number: [id number] Patient Account Number: [id number] Date of Birth/Sex: Treating RN: 09/20/1955 (66 y.o. [name] Primary Care Provider: Hoy Register Other Clinician: Referring Provider: Treating Provider/Extender: Camillo Flaming Weeks in Treatment: 13 History of Present Illness HPI Description: ADMISSION 03/12/18 This is a 66 year old and it was a type II diabetic reasonably poorly controlled with a recent hemoglobin A1c of 8.2. He was tending to his lawn on 01/15/18 when his weed [name] sent a rock up word hitting him in the right anterior leg. He was seen in his primary M.D. office is same time. An x-ray showed no abnormality. He was given a course of cephalexin. Since then he's been applying peroxide and topical antibiotics to the wound. He does not have a history of chronic wounds however he does have chronic edema in the lower legs. He is complaining of pain in the right leg wound but no real history of claudication. Past medical history includes type 2 diabetes, hypertension, morbid obesity. ABIs in the right leg were noncompressible 03/19/18 on evaluation today patient appears to be tolerating the wrap in general fairly well. He states he's not having that much pain in regard to the  wound itself. With that being said he is having some issues with slough buildup on the surface of the wound the Iodoflex does seem to be helpful in that regard. He is still having discomfort he wonders if I can send in a prescription for ibuprofen or something of like to help him out. I do believe that  the prox end may be of benefit for him. 03/25/18 on evaluation today patient appears to be doing very well in regard to his right lateral lower [name]. He's been tolerating the dressing changes without complication that is the wraps. Nonetheless he does continue to have pain he states is been dreading the possibility of having to have debridement again today. His first debridement experience was not optimal. With that being said he does seem to be showing signs of improvement there does appear to be little bit more granulation he does have a lot of slough that really does need to breeding away. 04/04/18;the patient went for arterial studies that were really quite normal. ABI on the right at 1.19 with triphasic waveforms on the left 1.11 with triphasic waveforms. TBI 0.93 on the right and 0.95 on the left. This suggests T should be able to tolerate even 4 layer compression. He is however complaining of a lot of pain with our wraps I'm wondering whether the pain is simply Iodoflex. I changed him to [employer] I'm still going to try to keep him in 3 layer compression 04/12/18 on evaluation today patient actually appears to be doing rather well in regard to his right lower [name] ulcer. He is making good progress although it is slow still I do believe that the Santyl is much better than the Iodoflex. The good news is the patient seems to be tolerating the dressing changes without complication now that we've gotten good of the Iodoflex which really did burn him quite significantly. Otherwise there's no evidence of infection. 04/17/18 on evaluation today patient actually  appears to be showing signs of improvement in regard to the ulcer. Unfortunately he's had somewhat of a rough day due to the fact that he found out that one of his friends suddenly and unexpectedly passed today. He states he's not sure he's in the frame of mind to allow me to debride the wound at this point. Nonetheless I do feel like he is showing signs of the wound getting better little by little each week. 04/24/18 on evaluation today patient's ulcer actually appears to be showing some signs of improvement albeit slow. He has been tolerating the dressing changes without complication except for the wrap which she states was so tight that he had to remove it it was causing a lot of discomfort. Fortunately there is no evidence of infection at this point. He states he only wore the wrap until about the time he got home last week and that he had to take it off. Nonetheless he does not have any other compression stockings, Juxta-Lite wraps, or anything otherwise to really help at this point as far as compression is concerned. 05/01/18 on evaluation today patient actually appears to be doing fairly well in regard to his lower extremity ulcer. He has been tolerating the dressing changes currently without complication. The wrap does seem to be helping as far as the fluid management is concerned. Wound bed itself actually show signs of good improvement although there is some Slough noted there's not as much as previous and he actually has fairly decent granulation noted as well. Overall I'm pleased with the progress of to this point. The patient would prefer not to have any debridement today he states he's actually not feeling too well in general and he really does not want any additional discomfort typically debridement is fairly uncomfortable for him. 05/08/18 on evaluation today patient actually appears to be doing a  little better in regard to his lower extremity ulcer. He has been tolerating the  dressing changes without complication. With that being said I'm actually quite pleased with the fact the wound is not appear to be infected and in general has made a little progress. With that being said I do believe we need to perform some debridement to clean away the necrotic tissue on the surface of the wound. 05/15/18 on evaluation today patient actually appears to be doing a little better in regard to his wound. Fortunately there is some improvement noted fortunately there's also no significant evidence of infection at this point in time. He does have continued pain that really nothing different than previous he did get his Juxta-Lite wrap. 05/29/18 on evaluation today patient actually appears to be doing somewhat better in regard to his wound currently. He's been tolerating the dressing changes without complication. With that being said he has been performing the [facility] Dressing changes every day at home. I'm pretty sure that we told him every other day last week or when I saw him rather two weeks ago. With that being said obviously did not hurt to do it daily just that obviously is gonna run out of supplies sooner and that could get him into trouble as far as his insurance is concerned. Nonetheless he at this point still continues to have pain although honestly I don't feel like it's as bad as what it was in the past just based on his reactions at this point. 06/12/18 on evaluation today patient actually appears to be doing better in regard to his lower [name] ulcer. He's been tolerating the dressing changes without complication. Fortunately there does not appear to be any evidence of infection at this time. No fevers, chills, nausea, or vomiting noted at this time. 06/19/18 evaluation today patient's ulcer on the lower extremity appears to be doing better at this point. he has been tolerating the dressing changes. The patient seems to be somewhat depressed about the progress of his  wound although the overall appearance at this time seems to be doing well. In general I'm very pleased with the appearance today he does have some biofilm on the surface of the wound as well as of hyper granular tissue we may want to utilize a little bit of silver nitrate today. 06/27/2018; patient comes into clinic today for a wound on the right lateral calf likely related to chronic venous insufficiency. He has been using medihoney covered with Hydrofera Blue. Wound surface actually looks quite good 07/03/2018 seen today for follow-up and management of lower extremity ulcer right lateral shin. T olerating dressing changes. He has a small amount of biofilm today. Will attempt to remove a portion of the biofilm as he tolerates. He becomes very anxious with wound treatments. He would benefit from layer compression wraps however he refuses compression wraps or anything that smokes his legs. He expressed an inability to tolerate compression wraps. Juxta lite wraps have been ordered. He has been instructed on the appropriate application of the juxta leg wraps. His blood pressure today on visit was 200/120. He denies any blurred vision, chest pain, dizziness, shortness of breath, or difficulty with mobility. Mr. Wyka stated that he had a recent visit with his primary care provider. At that time his carvedilol was decreased from 25 mg daily to 12.5 mg daily. Strongly encouraged Mr. Bartosiewicz to seek medical attention today during visit. He declined transport for evaluation of elevated blood pressure. Encouraged him to contact his  primary care provider today for medication management. He stated that he would call his primary care doctor after wound visit today. Also encouraged him that if he experienced any symptoms of blurred vision, chest pain, shortness of breath to immediately seek medical attention. 07/17/18 on evaluation today patient's wound actually does seem to show signs of improvement based on the  overall appearance of the wound bed today. There does not appear to show any signs of infection which is good news. There is no overall worsening which is also good news. He still has hyper granular tissue will use in the [employer] followed by [facility] Dressing this point. 07/31/18 on evaluation today patient's wound bed actually show signs of improvement with good epithelialization especially in the upper portion of the wound. He has been tolerating the dressing changes without complication in general. Overall I'm extremely happy with how things stand. No fevers, chills, nausea, or vomiting noted at this time. 08/28/18 on evaluation today patient appears to be doing a little better in regard to his lower extremity ulcer. With that being said it appears to be very hyper granular I think this is directly attributed to the fact that he continues to use the [employer] underneath the [facility] Dressing. Although this helps not to stick unfortunately also think he's not getting the benefit of the [facility] Dressing particular and subsequently is causing too much moisture buildup hence the hyper granulation. Fortunately there does not appear to be any signs of infection at this time which is good news. 09/03/18 on evaluation today patient appears to be doing well in regard to his lower [name] ulcer. He has been tolerating the dressing changes without complication. Fortunately he has much less hyper granular tissue the noted last week I do think using silver nitrate in changing to using the [facility] Dressing without the mepitel has made a big difference for him this is good news. 09/18/18 on evaluation today patient appears to be doing excellent in regard to his right lower [name] ulcer. This is actually significantly smaller even compared to the last evaluation. Overall I'm very pleased in this regard. I'm a recommend that we likely repeat the silver nitrate today based on what I'm  seeing. 10/02/18 on evaluation today patient appears to be doing excellent in regard to his lower extremity wound on the right. He's been tolerating the dressing changes without complication. Fortunately there's no signs of infection at this time. Overall been very pleased with how things seem to be progressing. No fevers, chills, nausea, or vomiting noted at this time. 10/16/18 on evaluation today patient actually appears to be doing much better in regard to his lower extremity wound on the right. This is shown signs of good improvement and is indeed measuring smaller he is on a excellent track as far as healing is concerned. My hope is this will be healed of the next several weeks. Fortunately there is no evidence of infection at this time. He does seem to be doing everything that I'm recommending for him 10/30/18 on evaluation today patient appears to be doing better in regard to his lower extremity ulcer. He's been tolerating the dressing changes without complication. Fortunately the Hydrofera Blue Dressing to be doing the job. He has made excellent progress. With that being said this is still going somewhat slow but nonetheless is always making good progress at this point. 5/18-Patient was in to be seen for right leg ulcer that appeared after scab above it was removed today. This  area had healed completely. 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 The wound has not healed though it has been nearly 2 months since the injury occurred. He was seen in the emergency department at [facility] on May 9. At that time it was very painful and he described it as a "15 on a scale of 010". He also was found to have 3+ pitting edema to the  bilateral lower extremities. He was prescribed a weeks course of Keflex. He is here today because the wound has failed to heal and he has a prior history in our clinic. ABI in clinic today was noncompressible. He did have formal vascular studies performed in 2019 which were normal. The last hemoglobin A1c I have available for review was from March 21, 2021 and was elevated at 7.7%. The wound is fairly small and circular located about 3 inches above his right lateral malleolus. It is completely covered with eschar. No surrounding erythema, no odor, no purulent drainage. 01/11/2022: The wound on his right lateral leg is a little bit smaller today. It has reaccumulated some slough and a small bit of eschar. It remains fairly painful. He has another area on his anterior tibia that he will not let me touch but I am concerned that there is a wound forming underneath the surface. 01/18/2022: Unfortunately, his wound is a little bit bigger today. He continues to accumulate slough on the wound surface. It is still quite tender. 01/25/2022: The patient bumped his wound on the car door which resulted in bleeding. He was seen at urgent care where it was redressed. Unfortunately, the wound is bigger again today. There is accumulated slough on the surface. Urgent care gave him some tramadol, apparently and he took this prior to his visit so he could tolerate debridement better. 02/02/2022: The wound is a little bit smaller today. The surface, however, has accumulated a fairly thick layer of slough. The periwound skin is intact and there is no obvious sign of infection. 02/07/2022: The wound is a bit larger today. It has thick slough accumulation. The periwound skin is intact and there is no obvious erythema, induration, nor any odor. 02/16/2022: I took a culture last week due to the appearance of the wound and the patient's degree of pain. This grew out methicillin sensitive Staph aureus. Augmentin was prescribed. The  patient states that the pain is markedly improved today. The wound also looks much better. It is smaller with less slough buildup and there is good granulation tissue emerging. 02/21/2022: The patient's pain is quite improved from prior visits. The wound is about the same size and has some accumulated slough on the surface. The base is a little bit fibrotic but there are some areas of granulation tissue filling in. 02/28/2022: The wound is a little bit narrower on its measurements but slightly longer. There is still some slough accumulation on the surface, but he has minimal pain and there is no concern for ongoing infection. Granulation tissue is emerging. 03/07/2022: During the week, he cut holes in his wrap because it was painful and too tight. As a result, his leg was quite a bit more swollen today and his wound was larger. The wound surface is fairly  clean but a little bit dry. Minimal slough accumulation. Granulation tissue present. 03/14/2022: Once again, he did some arts and crafts projects with his wrap. On the other hand, however, his wound does look better. It is smaller today and is beginning to fill in. Minimal slough and a small amount of eschar accumulation. 03/27/2022: His wound measured bigger today, although it does not appear to be so on my impression. The wound continues to fill with granulation tissue. There is a little bit of slough and eschar accumulation. He continues to cut his wrap off of his feet and his edema control at the ankle is particularly poor with 3+ pitting edema. His insurance will not cover any sort of skin substitute unfortunately. 04/04/2022: The wound is smaller today. It is more superficial with good granulation tissue and just a little bit of slough. He continues to cut away portions of his wrap which results in inadequate edema control. Electronic Signature(s) Signed: 04/04/2022 11:30:38 AM By: Duanne Guess MD FACS Entered By: Duanne Guess on 04/04/2022  11:30:38 -------------------------------------------------------------------------------- Physical Exam Details Patient Name: Date of Service: Anthony Barnes, Anthony [name] 04/04/2022 10:45 A M Medical Record Number: [id number] Patient Account Number: [id number] Date of Birth/Sex: Treating RN: Mar 17, 1956 (66 y.o. [name] Primary Care Provider: Hoy Register Other Clinician: Referring Provider: Treating Provider/Extender: Camillo Flaming Weeks in Treatment: 13 Constitutional Hypertensive, asymptomatic. . . . No acute distress.Marland Kitchen Respiratory Normal work of breathing on room air.. Notes 04/04/2022: The wound is smaller today. It is more superficial with good granulation tissue and just a little bit of slough. He continues to cut away portions of his wrap which results in inadequate edema control. Electronic Signature(s) Signed: 04/04/2022 11:32:34 AM By: Duanne Guess MD FACS Entered By: Duanne Guess on 04/04/2022 11:32:34 -------------------------------------------------------------------------------- Physician Orders Details Patient Name: Date of Service: Anthony Barnes, Anthony [name] 04/04/2022 10:45 A M Medical Record Number: [id number] Patient Account Number: [id number] Date of Birth/Sex: Treating RN: 1956/06/28 (66 y.o. Valma Cava Primary Care Provider: Hoy Register Other Clinician: Referring Provider: Treating Provider/Extender: Camillo Flaming Weeks in Treatment: 30 Verbal / Phone Orders: No Diagnosis Coding ICD-10 Coding Code Description [id number] Non-pressure chronic ulcer of other part of right lower leg with fat layer exposed E11.622 Type 2 diabetes mellitus with other skin ulcer E11.9 Type 2 diabetes mellitus without complications I10 Essential (primary) hypertension I87.2 Venous insufficiency (chronic) (peripheral) Follow-up Appointments ppointment in 1 week. - Dr. Lady Gary - room 2 - Return A Tuesday 04/11/22 at 10:00  AM Bathing/ Shower/ Hygiene May shower with protection but do not get wound dressing(s) wet. - may purchase a cast protector from [facility] or [facility] Edema Control - Lymphedema / SCD / Other Elevate legs to the level of the heart or above for 30 minutes daily and/or when sitting, a frequency of: Avoid standing for long periods of time. Patient to wear own compression stockings every day. Exercise regularly Moisturize legs daily. Compression stocking or Garment 20-30 mm/Hg pressure to: - to L left until R leg is healed Wound Treatment Wound #2 - Lower Leg Wound Laterality: Right, Lateral Cleanser: Soap and Water 1 x Per Week/30 Days Discharge Instructions: May shower and wash wound with dial antibacterial soap and water prior to dressing change. Cleanser: Wound Cleanser 1 x Per Week/30 Days Discharge Instructions: Cleanse the wound with wound cleanser prior to applying a clean dressing using gauze sponges, not tissue or cotton balls. Peri-Wound Care: Sween Lotion (Moisturizing lotion) 1 x Per  Week/30 Days Discharge Instructions: Apply moisturizing lotion as directed Prim Dressing: Hydrofera Blue Ready Foam, 4x5 in 1 x Per Week/30 Days ary Discharge Instructions: Apply to wound bed as instructed Secondary Dressing: Woven Gauze Sponge, Non-Sterile 4x4 in 1 x Per Week/30 Days Discharge Instructions: Apply over primary dressing as directed. Secured With: Coban Self-Adherent Wrap 4x5 (in/yd) 1 x Per Week/30 Days Discharge Instructions: Secure with Coban as directed. Secured With: [employer] Roll Sterile, 4.5x3.1 (in/yd) 1 x Per Week/30 Days Discharge Instructions: Secure with Kerlix as directed. Secured With: 73M Medipore H Soft Cloth Surgical T ape, 4 x 10 (in/yd) 1 x Per Week/30 Days Discharge Instructions: Secure with tape as directed. Electronic Signature(s) Signed: 04/04/2022 12:10:29 PM By: Duanne Guessannon, Joselyn Edling MD FACS Entered By: Duanne Guessannon, Saharah Sherrow on 04/04/2022  11:33:23 -------------------------------------------------------------------------------- Problem List Details Patient Name: Date of Service: Anthony HarborGLA DNEY, Anthony [name] 04/04/2022 10:45 A M Medical Record Number: [id number] Patient Account Number: [id number] Date of Birth/Sex: Treating RN: 1955/08/17 (66 y.o. Valma CavaM) Zochol, Jamie Primary Care Provider: Hoy RegisterNewlin, Enobong Other Clinician: Referring Provider: Treating Provider/Extender: Camillo Flamingannon, Natayla Cadenhead Newlin, Enobong Weeks in Treatment: 13 Active Problems ICD-10 Encounter Code Description Active Date MDM Diagnosis L97.812 Non-pressure chronic ulcer of other part of right lower leg with fat layer 01/02/2022 No Yes exposed E11.622 Type 2 diabetes mellitus with other skin ulcer 01/02/2022 No Yes E11.9 Type 2 diabetes mellitus without complications 01/02/2022 No Yes I10 Essential (primary) hypertension 01/02/2022 No Yes I87.2 Venous insufficiency (chronic) (peripheral) 01/02/2022 No Yes Inactive Problems Resolved Problems Electronic Signature(s) Signed: 04/04/2022 11:29:39 AM By: Duanne Guessannon, Rosaire Cueto MD FACS Entered By: Duanne Guessannon, Joee Iovine on 04/04/2022 11:29:39 -------------------------------------------------------------------------------- Progress Note Details Patient Name: Date of Service: Anthony HarborGLA DNEY, Anthony [name] 04/04/2022 10:45 A M Medical Record Number: [id number] Patient Account Number: [id number] Date of Birth/Sex: Treating RN: 1955/08/17 (66 y.o. [name]) Herrington, Taylor Primary Care Provider: Hoy RegisterNewlin, Enobong Other Clinician: Referring Provider: Treating Provider/Extender: Camillo Flamingannon, Maleeyah Mccaughey Newlin, Enobong Weeks in Treatment: 13 Subjective Chief Complaint Information obtained from Patient Right LE Ulcer History of Present Illness (HPI) ADMISSION 03/12/18 This is a 66 year old and it was a type II diabetic reasonably poorly controlled with a recent hemoglobin A1c of 8.2. He was tending to his lawn on 01/15/18 when his weed [name] sent a rock up word  hitting him in the right anterior leg. He was seen in his primary M.D. office is same time. An x-ray showed no abnormality. He was given a course of cephalexin. Since then he's been applying peroxide and topical antibiotics to the wound. He does not have a history of chronic wounds however he does have chronic edema in the lower legs. He is complaining of pain in the right leg wound but no real history of claudication. Past medical history includes type 2 diabetes, hypertension, morbid obesity. ABIs in the right leg were noncompressible 03/19/18 on evaluation today patient appears to be tolerating the wrap in general fairly well. He states he's not having that much pain in regard to the wound itself. With that being said he is having some issues with slough buildup on the surface of the wound the Iodoflex does seem to be helpful in that regard. He is still having discomfort he wonders if I can send in a prescription for ibuprofen or something of like to help him out. I do believe that the prox end may be of benefit for him. 03/25/18 on evaluation today patient appears to be doing very well in regard to his right lateral lower [name].  He's been tolerating the dressing changes without complication that is the wraps. Nonetheless he does continue to have pain he states is been dreading the possibility of having to have debridement again today. His first debridement experience was not optimal. With that being said he does seem to be showing signs of improvement there does appear to be little bit more granulation he does have a lot of slough that really does need to breeding away. 04/04/18;the patient went for arterial studies that were really quite normal. ABI on the right at 1.19 with triphasic waveforms on the left 1.11 with triphasic waveforms. TBI 0.93 on the right and 0.95 on the left. This suggests T should be able to tolerate even 4 layer compression. He is however complaining of a  lot of pain with our wraps I'm wondering whether the pain is simply Iodoflex. I changed him to [employer] I'm still going to try to keep him in 3 layer compression 04/12/18 on evaluation today patient actually appears to be doing rather well in regard to his right lower [name] ulcer. He is making good progress although it is slow still I do believe that the Santyl is much better than the Iodoflex. The good news is the patient seems to be tolerating the dressing changes without complication now that we've gotten good of the Iodoflex which really did burn him quite significantly. Otherwise there's no evidence of infection. 04/17/18 on evaluation today patient actually appears to be showing signs of improvement in regard to the ulcer. Unfortunately he's had somewhat of a rough day due to the fact that he found out that one of his friends suddenly and unexpectedly passed today. He states he's not sure he's in the frame of mind to allow me to debride the wound at this point. Nonetheless I do feel like he is showing signs of the wound getting better little by little each week. 04/24/18 on evaluation today patient's ulcer actually appears to be showing some signs of improvement albeit slow. He has been tolerating the dressing changes without complication except for the wrap which she states was so tight that he had to remove it it was causing a lot of discomfort. Fortunately there is no evidence of infection at this point. He states he only wore the wrap until about the time he got home last week and that he had to take it off. Nonetheless he does not have any other compression stockings, Juxta-Lite wraps, or anything otherwise to really help at this point as far as compression is concerned. 05/01/18 on evaluation today patient actually appears to be doing fairly well in regard to his lower extremity ulcer. He has been tolerating the dressing changes currently without complication. The wrap  does seem to be helping as far as the fluid management is concerned. Wound bed itself actually show signs of good improvement although there is some Slough noted there's not as much as previous and he actually has fairly decent granulation noted as well. Overall I'm pleased with the progress of to this point. The patient would prefer not to have any debridement today he states he's actually not feeling too well in general and he really does not want any additional discomfort typically debridement is fairly uncomfortable for him. 05/08/18 on evaluation today patient actually appears to be doing a little better in regard to his lower extremity ulcer. He has been tolerating the dressing changes without complication. With that being said I'm actually quite pleased with the fact the  wound is not appear to be infected and in general has made a little progress. With that being said I do believe we need to perform some debridement to clean away the necrotic tissue on the surface of the wound. 05/15/18 on evaluation today patient actually appears to be doing a little better in regard to his wound. Fortunately there is some improvement noted fortunately there's also no significant evidence of infection at this point in time. He does have continued pain that really nothing different than previous he did get his Juxta-Lite wrap. 05/29/18 on evaluation today patient actually appears to be doing somewhat better in regard to his wound currently. He's been tolerating the dressing changes without complication. With that being said he has been performing the [facility] Dressing changes every day at home. I'm pretty sure that we told him every other day last week or when I saw him rather two weeks ago. With that being said obviously did not hurt to do it daily just that obviously is gonna run out of supplies sooner and that could get him into trouble as far as his insurance is concerned. Nonetheless he at this point  still continues to have pain although honestly I don't feel like it's as bad as what it was in the past just based on his reactions at this point. 06/12/18 on evaluation today patient actually appears to be doing better in regard to his lower [name] ulcer. He's been tolerating the dressing changes without complication. Fortunately there does not appear to be any evidence of infection at this time. No fevers, chills, nausea, or vomiting noted at this time. 06/19/18 evaluation today patient's ulcer on the lower extremity appears to be doing better at this point. he has been tolerating the dressing changes. The patient seems to be somewhat depressed about the progress of his wound although the overall appearance at this time seems to be doing well. In general I'm very pleased with the appearance today he does have some biofilm on the surface of the wound as well as of hyper granular tissue we may want to utilize a little bit of silver nitrate today. 06/27/2018; patient comes into clinic today for a wound on the right lateral calf likely related to chronic venous insufficiency. He has been using medihoney covered with Hydrofera Blue. Wound surface actually looks quite good 07/03/2018 seen today for follow-up and management of lower extremity ulcer right lateral shin. T olerating dressing changes. He has a small amount of biofilm today. Will attempt to remove a portion of the biofilm as he tolerates. He becomes very anxious with wound treatments. He would benefit from layer compression wraps however he refuses compression wraps or anything that smokes his legs. He expressed an inability to tolerate compression wraps. Juxta lite wraps have been ordered. He has been instructed on the appropriate application of the juxta leg wraps. His blood pressure today on visit was 200/120. He denies any blurred vision, chest pain, dizziness, shortness of breath, or difficulty with mobility. Mr. Gertz stated that he had  a recent visit with his primary care provider. At that time his carvedilol was decreased from 25 mg daily to 12.5 mg daily. Strongly encouraged Mr. Ohman to seek medical attention today during visit. He declined transport for evaluation of elevated blood pressure. Encouraged him to contact his primary care provider today for medication management. He stated that he would call his primary care doctor after wound visit today. Also encouraged him that if he experienced any symptoms  of blurred vision, chest pain, shortness of breath to immediately seek medical attention. 07/17/18 on evaluation today patient's wound actually does seem to show signs of improvement based on the overall appearance of the wound bed today. There does not appear to show any signs of infection which is good news. There is no overall worsening which is also good news. He still has hyper granular tissue will use in the [employer] followed by [facility] Dressing this point. 07/31/18 on evaluation today patient's wound bed actually show signs of improvement with good epithelialization especially in the upper portion of the wound. He has been tolerating the dressing changes without complication in general. Overall I'm extremely happy with how things stand. No fevers, chills, nausea, or vomiting noted at this time. 08/28/18 on evaluation today patient appears to be doing a little better in regard to his lower extremity ulcer. With that being said it appears to be very hyper granular I think this is directly attributed to the fact that he continues to use the [employer] underneath the [facility] Dressing. Although this helps not to stick unfortunately also think he's not getting the benefit of the [facility] Dressing particular and subsequently is causing too much moisture buildup hence the hyper granulation. Fortunately there does not appear to be any signs of infection at this time which is good news. 09/03/18 on evaluation today  patient appears to be doing well in regard to his lower [name] ulcer. He has been tolerating the dressing changes without complication. Fortunately he has much less hyper granular tissue the noted last week I do think using silver nitrate in changing to using the [facility] Dressing without the mepitel has made a big difference for him this is good news. 09/18/18 on evaluation today patient appears to be doing excellent in regard to his right lower [name] ulcer. This is actually significantly smaller even compared to the last evaluation. Overall I'm very pleased in this regard. I'm a recommend that we likely repeat the silver nitrate today based on what I'm seeing. 10/02/18 on evaluation today patient appears to be doing excellent in regard to his lower extremity wound on the right. He's been tolerating the dressing changes without complication. Fortunately there's no signs of infection at this time. Overall been very pleased with how things seem to be progressing. No fevers, chills, nausea, or vomiting noted at this time. 10/16/18 on evaluation today patient actually appears to be doing much better in regard to his lower extremity wound on the right. This is shown signs of good improvement and is indeed measuring smaller he is on a excellent track as far as healing is concerned. My hope is this will be healed of the next several weeks. Fortunately there is no evidence of infection at this time. He does seem to be doing everything that I'm recommending for him 10/30/18 on evaluation today patient appears to be doing better in regard to his lower extremity ulcer. He's been tolerating the dressing changes without complication. Fortunately the Hydrofera Blue Dressing to be doing the job. He has made excellent progress. With that being said this is still going somewhat slow but nonetheless is always making good progress at this point. 5/18-Patient was in to be seen for right leg ulcer that appeared after  scab above it was removed today. This area had healed completely. It appears that no further action is required on this READMISSION 01/02/2022 This is a now 66 year old male with poorly controlled type 2 diabetes mellitus and  chronic venous insufficiency who once again was performing lawn care when a rock was flung up by his weedeater and it struck him in the right lateral lower leg. The wound has not healed though it has been nearly 2 months since the injury occurred. He was seen in the emergency department at [facility] on May 9. At that time it was very painful and he described it as a "15 on a scale of 0oo10". He also was found to have 3+ pitting edema to the bilateral lower extremities. He was prescribed a weeks course of Keflex. He is here today because the wound has failed to heal and he has a prior history in our clinic. ABI in clinic today was noncompressible. He did have formal vascular studies performed in 2019 which were normal. The last hemoglobin A1c I have available for review was from March 21, 2021 and was elevated at 7.7%. The wound is fairly small and circular located about 3 inches above his right lateral malleolus. It is completely covered with eschar. No surrounding erythema, no odor, no purulent drainage. 01/11/2022: The wound on his right lateral leg is a little bit smaller today. It has reaccumulated some slough and a small bit of eschar. It remains fairly painful. He has another area on his anterior tibia that he will not let me touch but I am concerned that there is a wound forming underneath the surface. 01/18/2022: Unfortunately, his wound is a little bit bigger today. He continues to accumulate slough on the wound surface. It is still quite tender. 01/25/2022: The patient bumped his wound on the car door which resulted in bleeding. He was seen at urgent care where it was redressed. Unfortunately, the wound is bigger again today. There is accumulated slough on the surface.  Urgent care gave him some tramadol, apparently and he took this prior to his visit so he could tolerate debridement better. 02/02/2022: The wound is a little bit smaller today. The surface, however, has accumulated a fairly thick layer of slough. The periwound skin is intact and there is no obvious sign of infection. 02/07/2022: The wound is a bit larger today. It has thick slough accumulation. The periwound skin is intact and there is no obvious erythema, induration, nor any odor. 02/16/2022: I took a culture last week due to the appearance of the wound and the patient's degree of pain. This grew out methicillin sensitive Staph aureus. Augmentin was prescribed. The patient states that the pain is markedly improved today. The wound also looks much better. It is smaller with less slough buildup and there is good granulation tissue emerging. 02/21/2022: The patient's pain is quite improved from prior visits. The wound is about the same size and has some accumulated slough on the surface. The base is a little bit fibrotic but there are some areas of granulation tissue filling in. 02/28/2022: The wound is a little bit narrower on its measurements but slightly longer. There is still some slough accumulation on the surface, but he has minimal pain and there is no concern for ongoing infection. Granulation tissue is emerging. 03/07/2022: During the week, he cut holes in his wrap because it was painful and too tight. As a result, his leg was quite a bit more swollen today and his wound was larger. The wound surface is fairly clean but a little bit dry. Minimal slough accumulation. Granulation tissue present. 03/14/2022: Once again, he did some arts and crafts projects with his wrap. On the other hand, however, his  wound does look better. It is smaller today and is beginning to fill in. Minimal slough and a small amount of eschar accumulation. 03/27/2022: His wound measured bigger today, although it does not appear to  be so on my impression. The wound continues to fill with granulation tissue. There is a little bit of slough and eschar accumulation. He continues to cut his wrap off of his feet and his edema control at the ankle is particularly poor with 3+ pitting edema. His insurance will not cover any sort of skin substitute unfortunately. 04/04/2022: The wound is smaller today. It is more superficial with good granulation tissue and just a little bit of slough. He continues to cut away portions of his wrap which results in inadequate edema control. Patient History Information obtained from Patient. Family History Cancer - Mother, Diabetes - Mother,Father, Hypertension - Mother,Father, Kidney Disease - Siblings, Stroke - Father, No family history of Heart Disease, Hereditary Spherocytosis, Lung Disease, Seizures, Thyroid Problems, Tuberculosis. Social History Former smoker - ended on 08/14/1990, Marital Status - Married, Alcohol Use - Moderate, Drug Use - No History, Caffeine Use - Daily. Medical History Eyes Denies history of Cataracts, Glaucoma, Optic Neuritis Ear/Nose/Mouth/Throat Denies history of Chronic sinus problems/congestion, Middle ear problems Hematologic/Lymphatic Denies history of Anemia, Hemophilia, Human Immunodeficiency Virus, Lymphedema, Sickle Cell Disease Respiratory Denies history of Aspiration, Asthma, Chronic Obstructive Pulmonary Disease (COPD), Pneumothorax, Sleep Apnea, Tuberculosis Cardiovascular Patient has history of Hypertension Denies history of Angina, Arrhythmia, Congestive Heart Failure, Coronary Artery Disease, Deep Vein Thrombosis, Hypotension, Myocardial Infarction, Peripheral Arterial Disease, Peripheral Venous Disease, Phlebitis, Vasculitis Gastrointestinal Denies history of Cirrhosis , Colitis, Crohnoos, Hepatitis A, Hepatitis B, Hepatitis C Endocrine Patient has history of Type II Diabetes Genitourinary Denies history of End Stage Renal  Disease Immunological Denies history of Lupus Erythematosus, Raynaudoos, Scleroderma Integumentary (Skin) Denies history of History of Burn Musculoskeletal Denies history of Gout, Rheumatoid Arthritis, Osteoarthritis, Osteomyelitis Neurologic Denies history of Dementia, Neuropathy, Quadriplegia, Paraplegia, Seizure Disorder Oncologic Denies history of Received Chemotherapy, Received Radiation Psychiatric Denies history of Anorexia/bulimia, Confinement Anxiety Hospitalization/Surgery History - esophagogastroduodenoscopy. - brain aneurysm surgery. Medical A Surgical History Notes nd Constitutional Symptoms (General Health) obesity Gastrointestinal diverticulitis Neurologic neuropathy Objective Constitutional Hypertensive, asymptomatic. No acute distress.. Vitals Time Taken: 10:51 AM, Height: 75 in, Weight: 310 lbs, BMI: 38.7, Temperature: 98.0 F, Pulse: 70 bpm, Respiratory Rate: 18 breaths/min, Blood Pressure: 156/78 mmHg, Capillary Blood Glucose: 140 mg/dl. Respiratory Normal work of breathing on room air.. General Notes: 04/04/2022: The wound is smaller today. It is more superficial with good granulation tissue and just a little bit of slough. He continues to cut away portions of his wrap which results in inadequate edema control. Integumentary (Hair, Skin) Wound #2 status is Open. Original cause of wound was Trauma. The date acquired was: 12/03/2021. The wound has been in treatment 13 weeks. The wound is located on the Right,Lateral Lower Leg. The wound measures 4.5cm length x 1.6cm width x 0.2cm depth; 5.655cm^2 area and 1.131cm^3 volume. There is Fat Layer (Subcutaneous Tissue) exposed. There is no tunneling or undermining noted. There is a medium amount of serosanguineous drainage noted. The wound margin is distinct with the outline attached to the wound base. There is large (67-100%) red granulation within the wound bed. There is a small (1-33%) amount of necrotic tissue  within the wound bed including Adherent Slough. Assessment Active Problems ICD-10 Non-pressure chronic ulcer of other part of right lower leg with fat layer exposed Type 2 diabetes mellitus with other  skin ulcer Type 2 diabetes mellitus without complications Essential (primary) hypertension Venous insufficiency (chronic) (peripheral) Procedures Wound #2 Pre-procedure diagnosis of Wound #2 is a Venous Leg Ulcer located on the Right,Lateral Lower Leg .Severity of Tissue Pre Debridement is: Fat layer exposed. There was a Selective/Open Wound Non-Viable Tissue Debridement with a total area of 7.68 sq cm performed by Duanne Guess, MD. With the following instrument(s): Curette to remove Non-Viable tissue/material. Material removed includes [facility] after achieving pain control using Lidocaine 5% topical ointment. No specimens were taken. A time out was conducted at 11:05, prior to the start of the procedure. A Minimum amount of bleeding was controlled with Pressure. The procedure was tolerated well with a pain level of 0 throughout and a pain level of 0 following the procedure. Post Debridement Measurements: 4.8cm length x 1.6cm width x 0.2cm depth; 1.206cm^3 volume. Character of Wound/Ulcer Post Debridement is improved. Severity of Tissue Post Debridement is: Fat layer exposed. Post procedure Diagnosis Wound #2: Same as Pre-Procedure Plan Follow-up Appointments: Return Appointment in 1 week. - Dr. Lady Gary - room 2 - Tuesday 04/11/22 at 10:00 AM Bathing/ Shower/ Hygiene: May shower with protection but do not get wound dressing(s) wet. - may purchase a cast protector from [facility] or [facility] Edema Control - Lymphedema / SCD / Other: Elevate legs to the level of the heart or above for 30 minutes daily and/or when sitting, a frequency of: Avoid standing for long periods of time. Patient to wear own compression stockings every day. Exercise regularly Moisturize legs daily. Compression stocking or  Garment 20-30 mm/Hg pressure to: - to L left until R leg is healed WOUND #2: - Lower Leg Wound Laterality: Right, Lateral Cleanser: Soap and Water 1 x Per Week/30 Days Discharge Instructions: May shower and wash wound with dial antibacterial soap and water prior to dressing change. Cleanser: Wound Cleanser 1 x Per Week/30 Days Discharge Instructions: Cleanse the wound with wound cleanser prior to applying a clean dressing using gauze sponges, not tissue or cotton balls. Peri-Wound Care: Sween Lotion (Moisturizing lotion) 1 x Per Week/30 Days Discharge Instructions: Apply moisturizing lotion as directed Prim Dressing: Hydrofera Blue Ready Foam, 4x5 in 1 x Per Week/30 Days ary Discharge Instructions: Apply to wound bed as instructed Secondary Dressing: Woven Gauze Sponge, Non-Sterile 4x4 in 1 x Per Week/30 Days Discharge Instructions: Apply over primary dressing as directed. Secured With: [employer]an Self-Adherent Wrap 4x5 (in/yd) 1 x Per Week/30 Days Discharge Instructions: Secure with Coban as directed. Secured With: [employer], 4.5x3.1 (in/yd) 1 x Per Week/30 Days Discharge Instructions: Secure with Kerlix as directed. Secured With: 29M Medipore H Soft Cloth Surgical T ape, 4 x 10 (in/yd) 1 x Per Week/30 Days Discharge Instructions: Secure with tape as directed. 04/04/2022: The wound is smaller today. It is more superficial with good granulation tissue and just a little bit of slough. He continues to cut away portions of his wrap which results in inadequate edema control. I used a curette to debride the slough from the wound. We will continue to use the [facility] ready foam. We will try applying the first layer of an Unna boot under the Kerlix and Coban to see if this makes his wrap any more tolerable. Follow-up in 1 week. Electronic Signature(s) Signed: 04/04/2022 11:33:54 AM By: Duanne Guess MD FACS Entered By: Duanne Guess on 04/04/2022  11:33:54 -------------------------------------------------------------------------------- HxROS Details Patient Name: Date of Service: Anthony Barnes, Anthony [name] 04/04/2022 10:45 A M Medical Record Number: [id number] Patient Account  Number: [id number] Date of Birth/Sex: Treating RN: 12-02-1955 (66 y.o. [name] Primary Care Provider: Hoy Register Other Clinician: Referring Provider: Treating Provider/Extender: Camillo Flaming Weeks in Treatment: 13 Information Obtained From Patient Constitutional Symptoms (General Health) Medical History: Past Medical History Notes: obesity Eyes Medical History: Negative for: Cataracts; Glaucoma; Optic Neuritis Ear/Nose/Mouth/Throat Medical History: Negative for: Chronic sinus problems/congestion; Middle ear problems Hematologic/Lymphatic Medical History: Negative for: Anemia; Hemophilia; Human Immunodeficiency Virus; Lymphedema; Sickle Cell Disease Respiratory Medical History: Negative for: Aspiration; Asthma; Chronic Obstructive Pulmonary Disease (COPD); Pneumothorax; Sleep Apnea; Tuberculosis Cardiovascular Medical History: Positive for: Hypertension Negative for: Angina; Arrhythmia; Congestive Heart Failure; Coronary Artery Disease; Deep Vein Thrombosis; Hypotension; Myocardial Infarction; Peripheral Arterial Disease; Peripheral Venous Disease; Phlebitis; Vasculitis Gastrointestinal Medical History: Negative for: Cirrhosis ; Colitis; Crohns; Hepatitis A; Hepatitis B; Hepatitis C Past Medical History Notes: diverticulitis Endocrine Medical History: Positive for: Type II Diabetes Time with diabetes: 8 years Treated with: Oral agents Blood sugar tested every day: No Genitourinary Medical History: Negative for: End Stage Renal Disease Immunological Medical History: Negative for: Lupus Erythematosus; Raynauds; Scleroderma Integumentary (Skin) Medical History: Negative for: History of  Burn Musculoskeletal Medical History: Negative for: Gout; Rheumatoid Arthritis; Osteoarthritis; Osteomyelitis Neurologic Medical History: Negative for: Dementia; Neuropathy; Quadriplegia; Paraplegia; Seizure Disorder Past Medical History Notes: neuropathy Oncologic Medical History: Negative for: Received Chemotherapy; Received Radiation Psychiatric Medical History: Negative for: Anorexia/bulimia; Confinement Anxiety Immunizations Pneumococcal Vaccine: Received Pneumococcal Vaccination: No Immunization Notes: tetanus 2 years ago Implantable Devices None Hospitalization / Surgery History Type of Hospitalization/Surgery esophagogastroduodenoscopy brain aneurysm surgery Family and Social History Cancer: Yes - Mother; Diabetes: Yes - Mother,Father; Heart Disease: No; Hereditary Spherocytosis: No; Hypertension: Yes - Mother,Father; Kidney Disease: Yes - Siblings; Lung Disease: No; Seizures: No; Stroke: Yes - Father; Thyroid Problems: No; Tuberculosis: No; Former smoker - ended on 08/14/1990; Marital Status - Married; Alcohol Use: Moderate; Drug Use: No History; Caffeine Use: Daily; Financial Concerns: No; Food, Clothing or Shelter Needs: No; Support System Lacking: No; Transportation Concerns: No Electronic Signature(s) Signed: 04/04/2022 12:10:29 PM By: Duanne Guess MD FACS Signed: 04/10/2022 5:02:03 PM By: Samuella Bruin Entered By: Duanne Guess on 04/04/2022 11:32:07 -------------------------------------------------------------------------------- SuperBill Details Patient Name: Date of Service: Anthony Barnes, Anthony [name] 04/04/2022 Medical Record Number: [id number] Patient Account Number: [id number] Date of Birth/Sex: Treating RN: [birth year] (66 y.o. Valma Cava Primary Care Provider: Hoy Register Other Clinician: Referring Provider: Treating Provider/Extender: Camillo Flaming Weeks in Treatment: 13 Diagnosis Coding ICD-10 Codes Code  Description [phone] Non-pressure chronic ulcer of other part of right lower leg with fat layer exposed E11.622 Type 2 diabetes mellitus with other skin ulcer E11.9 Type 2 diabetes mellitus without complications I10 Essential (primary) hypertension I87.2 Venous insufficiency (chronic) (peripheral) Facility Procedures CPT4 Code: 82800349 Description: [phone] - DEBRIDE WOUND 1ST 20 SQ CM OR < ICD-10 Diagnosis Description 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os Modifier: ed Quantity: 1 Physician Procedures : CPT4 Code Description Modifier [id number] - WC PHYS LEVEL 3 - EST PT 25 ICD-10 Diagnosis Description 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osed E11.622 Type 2 diabetes mellitus with other skin ulcer  E11.9 Type 2 diabetes mellitus without complications I87.2 Venous insufficiency (chronic) (peripheral) Quantity: 1 : [id number] - WC PHYS DEBR WO ANESTH 20 SQ CM ICD-10 Diagnosis Description 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osed Quantity: 1 Electronic  Signature(s) Signed: 04/04/2022 11:34:11 AM By: Duanne Guess MD FACS Entered By: Duanne Guess on 04/04/2022 11:34:10

## 2022-04-10 NOTE — Progress Notes (Signed)
EZELL, POKE ([id number]) Visit Report for 04/04/2022 Arrival Information Details Patient Name: Date of Service: Anthony Barnes 04/04/2022 10:45 A M Medical Record Number: [id number] Patient Account Number: [id number] Date of Birth/Sex: Treating RN: 06-22-1956 (66 y.o. Anthony Barnes Primary Care Anthony Barnes: Anthony Barnes Other Clinician: Referring Anthony Barnes: Treating Anthony Barnes/Extender: Anthony Barnes: 13 Visit Information History Since Last Visit All ordered tests and consults were completed: Yes Patient Arrived: Ambulatory Added or deleted any medications: No Arrival Time: 10:49 Any new allergies or adverse reactions: No Accompanied By: self Had a fall or experienced change in No Transfer Assistance: None activities of daily living that may affect Patient Identification Verified: Yes risk of falls: Secondary Verification Process Completed: Yes Signs or symptoms of abuse/neglect since last visito No Patient Requires Transmission-Based Precautions: No Hospitalized since last visit: No Patient Has Alerts: No Implantable device outside of the clinic excluding No cellular tissue based products placed in the center since last visit: Has Dressing in Place as Prescribed: Yes Has Compression in Place as Prescribed: Yes Pain Present Now: No Electronic Signature(s) Signed: 04/04/2022 4:32:12 PM By: Anthony East RN Entered By: Anthony Barnes on 04/04/2022 10:49:37 -------------------------------------------------------------------------------- Encounter Discharge Information Details Patient Name: Date of Service: Anthony Barnes, [name] 04/04/2022 10:45 A M Medical Record Number: [id number] Patient Account Number: [id number] Date of Birth/Sex: Treating RN: 1956-02-17 (66 y.o. Anthony Barnes Primary Care Anthony Barnes: Anthony Barnes Other Clinician: Referring Anthony Barnes: Treating Anthony Barnes/Extender: Anthony Barnes: 25 Encounter Discharge Information Items Post Procedure Vitals Discharge Condition: Stable Temperature (F): 98.0 Ambulatory Status: Ambulatory Pulse (bpm): 70 Discharge Destination: Home Respiratory Rate (breaths/min): 18 Transportation: Private Auto Blood Pressure (mmHg): 156/78 Accompanied By: self Schedule Follow-up Appointment: Yes Clinical Summary of Care: Electronic Signature(s) Signed: 04/04/2022 4:32:12 PM By: Anthony East RN Entered By: Anthony Barnes on 04/04/2022 11:27:53 -------------------------------------------------------------------------------- Lower Extremity Assessment Details Patient Name: Date of Service: Anthony Barnes [name] 04/04/2022 10:45 A M Medical Record Number: [id number] Patient Account Number: [id number] Date of Birth/Sex: Treating RN: May 29, 1956 (66 y.o. Anthony Barnes Primary Care Anthony Barnes: Anthony Barnes Other Clinician: Referring Anthony Barnes: Treating Anthony Barnes/Extender: Anthony Barnes: 13 Edema Assessment Assessed: [Left: No] [Right: No] E[Left: dema] [Right: :] Calf Left: Right: Point of Measurement: From Medial Instep 42 cm Ankle Left: Right: Point of Measurement: From Medial Instep 26 cm Vascular Assessment Pulses: Dorsalis Pedis Palpable: [Right:Yes] Electronic Signature(s) Signed: 04/04/2022 4:32:12 PM By: Anthony East RN Entered By: Anthony Barnes on 04/04/2022 10:57:19 -------------------------------------------------------------------------------- Multi Wound Chart Details Patient Name: Date of Service: Anthony Barnes, [name] 04/04/2022 10:45 A M Medical Record Number: [id number] Patient Account Number: [id number] Date of Birth/Sex: Treating RN: [birth year] (66 y.o. Anthony Barnes Primary Care Anthony Barnes: Anthony Barnes Other Clinician: Referring Markia Kyer: Treating Kamel Haven/Extender: Anthony Barnes: 13 Vital Signs Height(in):  75 Capillary Blood Glucose(mg/dl): 140 Weight(lbs): 310 Pulse(bpm): 2 Body Mass Index(BMI): 38.7 Blood Pressure(mmHg): 156/78 Temperature(F): 98.0 Respiratory Rate(breaths/min): 18 Photos: [2:Right, Lateral Lower Leg] [N/A:N/A N/A] Wound Location: [2:Trauma] [N/A:N/A] Wounding Event: [2:Venous Leg Ulcer] [N/A:N/A] Primary Etiology: [2:Hypertension, Type II Diabetes] [N/A:N/A] Comorbid History: [2:12/03/2021] [N/A:N/A] Date Acquired: [2:13] [N/A:N/A] Weeks of Barnes: [2:Open] [N/A:N/A] Wound Status: [2:No] [N/A:N/A] Wound Recurrence: [2:4.5x1.6x0.2] [N/A:N/A] Measurements L x W x D (cm) [2:5.655] [N/A:N/A] A (cm) : rea [2:1.131] [N/A:N/A] Volume (cm) : [2:-433.50%] [N/A:N/A] % Reduction in A rea: [2:-967.00%] [N/A:N/A] % Reduction in Volume: [2:Full Thickness Without  Exposed] [N/A:N/A] Classification: [2:Support Structures Medium] [N/A:N/A] Exudate A mount: [2:Serosanguineous] [N/A:N/A] Exudate Type: [2:red, brown] [N/A:N/A] Exudate Color: [2:Distinct, outline attached] [N/A:N/A] Wound Margin: [2:Large (67-100%)] [N/A:N/A] Granulation A mount: [2:Red] [N/A:N/A] Granulation Quality: [2:Small (1-33%)] [N/A:N/A] Necrotic A mount: [2:Fat Layer (Subcutaneous Tissue): Yes N/A] Exposed Structures: [2:Fascia: No Tendon: No Muscle: No Joint: No Bone: No Small (1-33%)] [N/A:N/A] Epithelialization: [2:Debridement - Selective/Open Wound N/A] Debridement: Pre-procedure Verification/Time Out 11:05 [N/A:N/A] Taken: [2:Lidocaine 5% topical ointment] [N/A:N/A] Pain Control: [2:Slough] [N/A:N/A] Tissue Debrided: [2:Non-Viable Tissue] [N/A:N/A] Level: [2:7.68] [N/A:N/A] Debridement A (sq cm): [2:rea Curette] [N/A:N/A] Instrument: [2:Minimum] [N/A:N/A] Bleeding: [2:Pressure] [N/A:N/A] Hemostasis A chieved: [2:0] [N/A:N/A] Procedural Pain: [2:0] [N/A:N/A] Post Procedural Pain: [2:Procedure was tolerated well] [N/A:N/A] Debridement Barnes Response: [2:4.8x1.6x0.2] [N/A:N/A] Post  Debridement Measurements L x W x D (cm) [2:1.206] [N/A:N/A] Post Debridement Volume: (cm) [2:Debridement] [N/A:N/A] Barnes Notes Wound #2 (Lower Leg) Wound Laterality: Right, Lateral Cleanser Soap and Water Discharge Instruction: May shower and wash wound with dial antibacterial soap and water prior to dressing change. Wound Cleanser Discharge Instruction: Cleanse the wound with wound cleanser prior to applying a clean dressing using gauze sponges, not tissue or cotton balls. Peri-Wound Care Sween Lotion (Moisturizing lotion) Discharge Instruction: Apply moisturizing lotion as directed Topical Primary Dressing Hydrofera Blue Ready Foam, 4x5 in Discharge Instruction: Apply to wound bed as instructed Secondary Dressing Woven Gauze Sponge, Non-Sterile 4x4 in Discharge Instruction: Apply over primary dressing as directed. Secured With [employer] 4x5 (in/yd) Discharge Instruction: Secure with Coban as directed. Kerlix Roll Sterile, 4.5x3.1 (in/yd) Discharge Instruction: Secure with Kerlix as directed. 61M Medipore H Soft Cloth Surgical T ape, 4 x 10 (in/yd) Discharge Instruction: Secure with tape as directed. Compression Wrap Compression Stockings Add-Ons Electronic Signature(s) Signed: 04/04/2022 11:29:47 AM By: Fredirick Maudlin MD FACS Signed: 04/10/2022 5:02:03 PM By: Sabas Sous By: Fredirick Maudlin on 04/04/2022 11:29:47 -------------------------------------------------------------------------------- Multi-Disciplinary Care Plan Details Patient Name: Date of Service: Anthony Barnes, [name] 04/04/2022 10:45 A M Medical Record Number: [id number] Patient Account Number: [id number] Date of Birth/Sex: Treating RN: Jun 08, 1956 (66 y.o. Anthony Barnes Primary Care Loucinda Croy: Anthony Barnes Other Clinician: Referring Cayden Granholm: Treating Lashonta Pilling/Extender: Anthony Barnes: 13 Active Inactive Venous Leg  Ulcer Nursing Diagnoses: Actual venous Insuffiency (use after diagnosis is confirmed) Knowledge deficit related to disease process and management Goals: Patient will maintain optimal edema control Date Initiated: 01/02/2022 Target Resolution Date: 04/14/2022 Goal Status: Active Patient/caregiver will verbalize understanding of disease process and disease management Date Initiated: 01/02/2022 Date Inactivated: 03/07/2022 Target Resolution Date: 03/09/2022 Goal Status: Met Interventions: Assess peripheral edema status every visit. Compression as ordered Provide education on venous insufficiency Barnes Activities: Non-invasive vascular studies : 01/02/2022 T ordered outside of clinic : 01/02/2022 est Therapeutic compression applied : 01/02/2022 Notes: Wound/Skin Impairment Nursing Diagnoses: Impaired tissue integrity Knowledge deficit related to ulceration/compromised skin integrity Goals: Patient/caregiver will verbalize understanding of skin care regimen Date Initiated: 01/02/2022 Date Inactivated: 02/02/2022 Target Resolution Date: 02/03/2022 Goal Status: Met Ulcer/skin breakdown will have a volume reduction of 30% by week 4 Date Initiated: 01/02/2022 Target Resolution Date: 04/14/2022 Goal Status: Active Interventions: Assess ulceration(s) every visit Provide education on ulcer and skin care Barnes Activities: Skin care regimen initiated : 01/02/2022 Topical wound management initiated : 01/02/2022 Notes: Electronic Signature(s) Signed: 04/04/2022 4:32:12 PM By: Anthony East RN Entered By: Anthony Barnes on 04/04/2022 11:01:49 -------------------------------------------------------------------------------- Pain Assessment Details Patient Name: Date of Service: Anthony Barnes, [name] 04/04/2022 10:45 A M Medical Record  Number: [id number] Patient Account Number: [id number] Date of Birth/Sex: Treating RN: Sep 12, 1955 (66 y.o. Anthony Barnes Primary Care Avi Archuleta: Anthony Barnes Other Clinician: Referring Matilde Pottenger: Treating Tyqwan Pink/Extender: Anthony Barnes: 13 Active Problems Location of Pain Severity and Description of Pain Patient Has Paino No Site Locations Pain Management and Medication Current Pain Management: Electronic Signature(s) Signed: 04/04/2022 4:32:12 PM By: Anthony East RN Entered By: Anthony Barnes on 04/04/2022 10:53:07 -------------------------------------------------------------------------------- Patient/Caregiver Education Details Patient Name: Date of Service: Anthony Barnes, [place]. 8/22/2023andnbsp10:45 A M Medical Record Number: [id number] Patient Account Number: [id number] Date of Birth/Gender: Treating RN: 08-Feb-1956 (66 y.o. Anthony Barnes Primary Care Physician: Anthony Barnes Other Clinician: Referring Physician: Treating Physician/Extender: Harriet Masson in Barnes: 13 Education Assessment Education Provided To: Patient Education Topics Provided Venous: Methods: Explain/Verbal Responses: Reinforcements needed, State content correctly Wound/Skin Impairment: Methods: Explain/Verbal Responses: Reinforcements needed, State content correctly Electronic Signature(s) Signed: 04/04/2022 4:32:12 PM By: Anthony East RN Entered By: Anthony Barnes on 04/04/2022 11:02:16 -------------------------------------------------------------------------------- Wound Assessment Details Patient Name: Date of Service: Anthony Barnes [name] 04/04/2022 10:45 A M Medical Record Number: [id number] Patient Account Number: [id number] Date of Birth/Sex: Treating RN: 10/01/1955 (66 y.o. Anthony Barnes Primary Care Rishaan Gunner: Anthony Barnes Other Clinician: Referring Toivo Bordon: Treating Chaniyah Jahr/Extender: Anthony Barnes: 13 Wound Status Wound Number: 2 Primary Etiology: Venous Leg Ulcer Wound Location: Right, Lateral Lower  Leg Wound Status: Open Wounding Event: Trauma Comorbid History: Hypertension, Type II Diabetes Date Acquired: 12/03/2021 Weeks Of Barnes: 13 Clustered Wound: No Photos Wound Measurements Length: (cm) 4.5 Width: (cm) 1.6 Depth: (cm) 0.2 Area: (cm) 5.655 Volume: (cm) 1.131 % Reduction in Area: -433.5% % Reduction in Volume: -967% Epithelialization: Small (1-33%) Tunneling: No Undermining: No Wound Description Classification: Full Thickness Without Exposed Support Structu Wound Margin: Distinct, outline attached Exudate Amount: Medium Exudate Type: Serosanguineous Exudate Color: red, brown Wound Bed Granulation Amount: Large (67-100%) Granulation Quality: Red Necrotic Amount: Small (1-33%) Necrotic Quality: Adherent Slough res [employer] Odor After Cleansing: No Slough/Fibrino Yes Exposed Structure Fascia Exposed: No Fat Layer (Subcutaneous Tissue) Exposed: Yes Tendon Exposed: No Muscle Exposed: No Joint Exposed: No Bone Exposed: No Barnes Notes Wound #2 (Lower Leg) Wound Laterality: Right, Lateral Cleanser Soap and Water Discharge Instruction: May shower and wash wound with dial antibacterial soap and water prior to dressing change. Wound Cleanser Discharge Instruction: Cleanse the wound with wound cleanser prior to applying a clean dressing using gauze sponges, not tissue or cotton balls. Peri-Wound Care Sween Lotion (Moisturizing lotion) Discharge Instruction: Apply moisturizing lotion as directed Topical Primary Dressing Hydrofera Blue Ready Foam, 4x5 in Discharge Instruction: Apply to wound bed as instructed Secondary Dressing Woven Gauze Sponge, Non-Sterile 4x4 in Discharge Instruction: Apply over primary dressing as directed. Secured With [employer] 4x5 (in/yd) Discharge Instruction: Secure with Coban as directed. Kerlix Roll Sterile, 4.5x3.1 (in/yd) Discharge Instruction: Secure with Kerlix as directed. 19M Medipore H Soft Cloth Surgical  T ape, 4 x 10 (in/yd) Discharge Instruction: Secure with tape as directed. Compression Wrap Compression Stockings Add-Ons Electronic Signature(s) Signed: 04/04/2022 4:32:12 PM By: Anthony East RN Entered By: Anthony Barnes on 04/04/2022 11:00:00 -------------------------------------------------------------------------------- Vitals Details Patient Name: Date of Service: Anthony Barnes, [name] 04/04/2022 10:45 A M Medical Record Number: [id number] Patient Account Number: [id number] Date of Birth/Sex: Treating RN: 1955-11-17 (66 y.o. Anthony Barnes Primary Care Akeen Ledyard: Anthony Barnes Other Clinician: Referring Ambriana Selway: Treating Graesyn Schreifels/Extender: Myrtie Neither, [place]  in Barnes: 13 Vital Signs Time Taken: 10:51 Temperature (F): 98.0 Height (in): 75 Pulse (bpm): 70 Weight (lbs): 310 Respiratory Rate (breaths/min): 18 Body Mass Index (BMI): 38.7 Blood Pressure (mmHg): 156/78 Capillary Blood Glucose (mg/dl): 140 Reference Range: 80 - 120 mg / dl Electronic Signature(s) Signed: 04/04/2022 4:32:12 PM By: Anthony East RN Entered By: Anthony Barnes on 04/04/2022 10:52:33

## 2022-04-11 ENCOUNTER — Encounter (HOSPITAL_BASED_OUTPATIENT_CLINIC_OR_DEPARTMENT_OTHER): Payer: Medicare Other | Admitting: General Surgery

## 2022-04-11 DIAGNOSIS — L97812 Non-pressure chronic ulcer of other part of right lower leg with fat layer exposed: Secondary | ICD-10-CM | POA: Diagnosis not present

## 2022-04-11 DIAGNOSIS — I10 Essential (primary) hypertension: Secondary | ICD-10-CM | POA: Diagnosis not present

## 2022-04-11 DIAGNOSIS — I872 Venous insufficiency (chronic) (peripheral): Secondary | ICD-10-CM | POA: Diagnosis not present

## 2022-04-11 DIAGNOSIS — Z6838 Body mass index (BMI) 38.0-38.9, adult: Secondary | ICD-10-CM | POA: Diagnosis not present

## 2022-04-11 DIAGNOSIS — E11622 Type 2 diabetes mellitus with other skin ulcer: Secondary | ICD-10-CM | POA: Diagnosis not present

## 2022-04-11 NOTE — Progress Notes (Signed)
Anthony Barnes, Anthony Barnes ([id number]) Visit Report for 04/11/2022 Chief Complaint Document Details Patient Name: Date of Service: Anthony Barnes Anthony E. 04/11/2022 10:00 A M Medical Record Number: [id number] Patient Account Number: [id number] Date of Birth/Sex: Treating RN: [birth year] (66 y.o. Anthony Barnes Primary Care Provider: Hoy Register Other Clinician: Referring Provider: Treating Provider/Extender: Camillo Flaming Weeks in Treatment: 14 Information Obtained from: Patient Chief Complaint Right LE Ulcer Electronic Signature(s) Signed: 04/11/2022 10:38:49 AM By: Duanne Guess MD FACS Entered By: Duanne Guess on 04/11/2022 10:38:49 -------------------------------------------------------------------------------- Debridement Details Patient Name: Date of Service: Anthony Barnes, Anthony Anthony E. 04/11/2022 10:00 A M Medical Record Number: [id number] Patient Account Number: [id number] Date of Birth/Sex: Treating RN: 24-Apr-1956 (66 y.o. Anthony Barnes Primary Care Provider: Hoy Register Other Clinician: Referring Provider: Treating Provider/Extender: Camillo Flaming Weeks in Treatment: 14 Debridement Performed for Assessment: Wound #2 Right,Lateral Lower Leg Performed By: Physician Duanne Guess, MD Debridement Type: Debridement Severity of Tissue Pre Debridement: Fat layer exposed Level of Consciousness (Pre-procedure): Awake and Alert Pre-procedure Verification/Time Out Yes - 10:31 Taken: Start Time: 10:31 Pain Control: Lidocaine 5% topical ointment T Area Debrided (L x W): otal 4.7 (cm) x 1.8 (cm) = 8.46 (cm) Tissue and other material debrided: Non-Viable, Slough, Slough Level: Non-Viable Tissue Debridement Description: Selective/Open Wound Instrument: Curette Bleeding: Minimum Hemostasis Achieved: Pressure Procedural Pain: 0 Post Procedural Pain: 0 Response to Treatment: Procedure was tolerated well Level of Consciousness  (Post- Awake and Alert procedure): Post Debridement Measurements of Total Wound Length: (cm) 4.7 Width: (cm) 1.8 Depth: (cm) 0.1 Volume: (cm) 0.664 Character of Wound/Ulcer Post Debridement: Improved Severity of Tissue Post Debridement: Fat layer exposed Post Procedure Diagnosis Same as Pre-procedure Electronic Signature(s) Signed: 04/11/2022 10:59:42 AM By: Duanne Guess MD FACS Signed: 04/11/2022 4:55:02 PM By: Samuella Bruin Entered By: Samuella Bruin on 04/11/2022 10:35:21 -------------------------------------------------------------------------------- HPI Details Patient Name: Date of Service: Anthony Barnes, Anthony Anthony E. 04/11/2022 10:00 A M Medical Record Number: [id number] Patient Account Number: [id number] Date of Birth/Sex: Treating RN: 1956/02/07 (66 y.o. Anthony Barnes Primary Care Provider: Hoy Register Other Clinician: Referring Provider: Treating Provider/Extender: Camillo Flaming Weeks in Treatment: 14 History of Present Illness HPI Description: ADMISSION 03/12/18 This is a 66 year old and it was a type II diabetic reasonably poorly controlled with a recent hemoglobin A1c of 8.2. He was tending to his lawn on 01/15/18 when his weed Anthony Barnes sent a rock up word hitting him in the right anterior leg. He was seen in his primary M.D. office is same time. An x-ray showed no abnormality. He was given a course of cephalexin. Since then he's been applying peroxide and topical antibiotics to the wound. He does not have a history of chronic wounds however he does have chronic edema in the lower legs. He is complaining of pain in the right leg wound but no real history of claudication. Past medical history includes type 2 diabetes, hypertension, morbid obesity. ABIs in the right leg were noncompressible 03/19/18 on evaluation today patient appears to be tolerating the wrap in general fairly well. He states he's not having that much pain in regard to the  wound itself. With that being said he is having some issues with slough buildup on the surface of the wound the Iodoflex does seem to be helpful in that regard. He is still having discomfort he wonders if I can send in a prescription for ibuprofen or something of like to help him out. I do believe that the  prox end may be of benefit for him. 03/25/18 on evaluation today patient appears to be doing very well in regard to his right lateral lower [name]. He's been tolerating the dressing changes without complication that is the wraps. Nonetheless he does continue to have pain he states is been dreading the possibility of having to have debridement again today. His first debridement experience was not optimal. With that being said he does seem to be showing signs of improvement there does appear to be little bit more granulation he does have a lot of slough that really does need to breeding away. 04/04/18;the patient went for arterial studies that were really quite normal. ABI on the right at 1.19 with triphasic waveforms on the left 1.11 with triphasic waveforms. TBI 0.93 on the right and 0.95 on the left. This suggests T should be able to tolerate even 4 layer compression. He is however complaining of a lot of pain with our wraps I'm wondering whether the pain is simply Iodoflex. I changed him to [employer] I'm still going to try to keep him in 3 layer compression 04/12/18 on evaluation today patient actually appears to be doing rather well in regard to his right lower [name] ulcer. He is making good progress although it is slow still I do believe that the Santyl is much better than the Iodoflex. The good news is the patient seems to be tolerating the dressing changes without complication now that we've gotten good of the Iodoflex which really did burn him quite significantly. Otherwise there's no evidence of infection. 04/17/18 on evaluation today patient actually  appears to be showing signs of improvement in regard to the ulcer. Unfortunately he's had somewhat of a rough day due to the fact that he found out that one of his friends suddenly and unexpectedly passed today. He states he's not sure he's in the frame of mind to allow me to debride the wound at this point. Nonetheless I do feel like he is showing signs of the wound getting better little by little each week. 04/24/18 on evaluation today patient's ulcer actually appears to be showing some signs of improvement albeit slow. He has been tolerating the dressing changes without complication except for the wrap which she states was so tight that he had to remove it it was causing a lot of discomfort. Fortunately there is no evidence of infection at this point. He states he only wore the wrap until about the time he got home last week and that he had to take it off. Nonetheless he does not have any other compression stockings, Juxta-Lite wraps, or anything otherwise to really help at this point as far as compression is concerned. 05/01/18 on evaluation today patient actually appears to be doing fairly well in regard to his lower extremity ulcer. He has been tolerating the dressing changes currently without complication. The wrap does seem to be helping as far as the fluid management is concerned. Wound bed itself actually show signs of good improvement although there is some Slough noted there's not as much as previous and he actually has fairly decent granulation noted as well. Overall I'm pleased with the progress of to this point. The patient would prefer not to have any debridement today he states he's actually not feeling too well in general and he really does not want any additional discomfort typically debridement is fairly uncomfortable for him. 05/08/18 on evaluation today patient actually appears to be doing a little  better in regard to his lower extremity ulcer. He has been tolerating the  dressing changes without complication. With that being said I'm actually quite pleased with the fact the wound is not appear to be infected and in general has made a little progress. With that being said I do believe we need to perform some debridement to clean away the necrotic tissue on the surface of the wound. 05/15/18 on evaluation today patient actually appears to be doing a little better in regard to his wound. Fortunately there is some improvement noted fortunately there's also no significant evidence of infection at this point in time. He does have continued pain that really nothing different than previous he did get his Juxta-Lite wrap. 05/29/18 on evaluation today patient actually appears to be doing somewhat better in regard to his wound currently. He's been tolerating the dressing changes without complication. With that being said he has been performing the [facility] Dist[facility] Dressing changes every day at home. I'm pretty sure that we told him every other day last week or when I saw him rather two weeks ago. With that being said obviously did not hurt to do it daily just that obviously is gonna run out of supplies sooner and that could get him into trouble as far as his insurance is concerned. Nonetheless he at this point still continues to have pain although honestly I don't feel like it's as bad as what it was in the past just based on his reactions at this point. 06/12/18 on evaluation today patient actually appears to be doing better in regard to his lower [name] ulcer. He's been tolerating the dressing changes without complication. Fortunately there does not appear to be any evidence of infection at this time. No fevers, chills, nausea, or vomiting noted at this time. 06/19/18 evaluation today patient's ulcer on the lower extremity appears to be doing better at this point. he has been tolerating the dressing changes. The patient seems to be somewhat depressed about the progress of his  wound although the overall appearance at this time seems to be doing well. In general I'm very pleased with the appearance today he does have some biofilm on the surface of the wound as well as of hyper granular tissue we may want to utilize a little bit of silver nitrate today. 06/27/2018; patient comes into clinic today for a wound on the right lateral calf likely related to chronic venous insufficiency. He has been using medihoney covered with Hydrofera Blue. Wound surface actually looks quite good 07/03/2018 seen today for follow-up and management of lower extremity ulcer right lateral shin. T olerating dressing changes. He has a small amount of biofilm today. Will attempt to remove a portion of the biofilm as he tolerates. He becomes very anxious with wound treatments. He would benefit from layer compression wraps however he refuses compression wraps or anything that smokes his legs. He expressed an inability to tolerate compression wraps. Juxta lite wraps have been ordered. He has been instructed on the appropriate application of the juxta leg wraps. His blood pressure today on visit was 200/120. He denies any blurred vision, chest pain, dizziness, shortness of breath, or difficulty with mobility. Mr. Loistine SimasGladney stated that he had a recent visit with his primary care provider. At that time his carvedilol was decreased from 25 mg daily to 12.5 mg daily. Strongly encouraged Mr. Loistine SimasGladney to seek medical attention today during visit. He declined transport for evaluation of elevated blood pressure. Encouraged him to contact his primary  care provider today for medication management. He stated that he would call his primary care doctor after wound visit today. Also encouraged him that if he experienced any symptoms of blurred vision, chest pain, shortness of breath to immediately seek medical attention. 07/17/18 on evaluation today patient's wound actually does seem to show signs of improvement based on the  overall appearance of the wound bed today. There does not appear to show any signs of infection which is good news. There is no overall worsening which is also good news. He still has hyper granular tissue will use in the [employer] followed by [facility] Dressing this point. 07/31/18 on evaluation today patient's wound bed actually show signs of improvement with good epithelialization especially in the upper portion of the wound. He has been tolerating the dressing changes without complication in general. Overall I'm extremely happy with how things stand. No fevers, chills, nausea, or vomiting noted at this time. 08/28/18 on evaluation today patient appears to be doing a little better in regard to his lower extremity ulcer. With that being said it appears to be very hyper granular I think this is directly attributed to the fact that he continues to use the [employer] underneath the [facility] Dressing. Although this helps not to stick unfortunately also think he's not getting the benefit of the [facility] Dressing particular and subsequently is causing too much moisture buildup hence the hyper granulation. Fortunately there does not appear to be any signs of infection at this time which is good news. 09/03/18 on evaluation today patient appears to be doing well in regard to his lower [name] ulcer. He has been tolerating the dressing changes without complication. Fortunately he has much less hyper granular tissue the noted last week I do think using silver nitrate in changing to using the [facility] Dressing without the mepitel has made a big difference for him this is good news. 09/18/18 on evaluation today patient appears to be doing excellent in regard to his right lower [name] ulcer. This is actually significantly smaller even compared to the last evaluation. Overall I'm very pleased in this regard. I'm a recommend that we likely repeat the silver nitrate today based on what I'm  seeing. 10/02/18 on evaluation today patient appears to be doing excellent in regard to his lower extremity wound on the right. He's been tolerating the dressing changes without complication. Fortunately there's no signs of infection at this time. Overall been very pleased with how things seem to be progressing. No fevers, chills, nausea, or vomiting noted at this time. 10/16/18 on evaluation today patient actually appears to be doing much better in regard to his lower extremity wound on the right. This is shown signs of good improvement and is indeed measuring smaller he is on a excellent track as far as healing is concerned. My hope is this will be healed of the next several weeks. Fortunately there is no evidence of infection at this time. He does seem to be doing everything that I'm recommending for him 10/30/18 on evaluation today patient appears to be doing better in regard to his lower extremity ulcer. He's been tolerating the dressing changes without complication. Fortunately the Hydrofera Blue Dressing to be doing the job. He has made excellent progress. With that being said this is still going somewhat slow but nonetheless is always making good progress at this point. 5/18-Patient was in to be seen for right leg ulcer that appeared after scab above it was removed today. This area  had healed completely. 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 The wound has not healed though it has been nearly 2 months since the injury occurred. He was seen in the emergency department at [facility] on May 9. At that time it was very painful and he described it as a "15 on a scale of 010". He also was found to have 3+ pitting edema to the  bilateral lower extremities. He was prescribed a weeks course of Keflex. He is here today because the wound has failed to heal and he has a prior history in our clinic. ABI in clinic today was noncompressible. He did have formal vascular studies performed in 2019 which were normal. The last hemoglobin A1c I have available for review was from March 21, 2021 and was elevated at 7.7%. The wound is fairly small and circular located about 3 inches above his right lateral malleolus. It is completely covered with eschar. No surrounding erythema, no odor, no purulent drainage. 01/11/2022: The wound on his right lateral leg is a little bit smaller today. It has reaccumulated some slough and a small bit of eschar. It remains fairly painful. He has another area on his anterior tibia that he will not let me touch but I am concerned that there is a wound forming underneath the surface. 01/18/2022: Unfortunately, his wound is a little bit bigger today. He continues to accumulate slough on the wound surface. It is still quite tender. 01/25/2022: The patient bumped his wound on the car door which resulted in bleeding. He was seen at urgent care where it was redressed. Unfortunately, the wound is bigger again today. There is accumulated slough on the surface. Urgent care gave him some tramadol, apparently and he took this prior to his visit so he could tolerate debridement better. 02/02/2022: The wound is a little bit smaller today. The surface, however, has accumulated a fairly thick layer of slough. The periwound skin is intact and there is no obvious sign of infection. 02/07/2022: The wound is a bit larger today. It has thick slough accumulation. The periwound skin is intact and there is no obvious erythema, induration, nor any odor. 02/16/2022: I took a culture last week due to the appearance of the wound and the patient's degree of pain. This grew out methicillin sensitive Staph aureus. Augmentin was prescribed. The  patient states that the pain is markedly improved today. The wound also looks much better. It is smaller with less slough buildup and there is good granulation tissue emerging. 02/21/2022: The patient's pain is quite improved from prior visits. The wound is about the same size and has some accumulated slough on the surface. The base is a little bit fibrotic but there are some areas of granulation tissue filling in. 02/28/2022: The wound is a little bit narrower on its measurements but slightly longer. There is still some slough accumulation on the surface, but he has minimal pain and there is no concern for ongoing infection. Granulation tissue is emerging. 03/07/2022: During the week, he cut holes in his wrap because it was painful and too tight. As a result, his leg was quite a bit more swollen today and his wound was larger. The wound surface is fairly clean  but a little bit dry. Minimal slough accumulation. Granulation tissue present. 03/14/2022: Once again, he did some arts and crafts projects with his wrap. On the other hand, however, his wound does look better. It is smaller today and is beginning to fill in. Minimal slough and a small amount of eschar accumulation. 03/27/2022: His wound measured bigger today, although it does not appear to be so on my impression. The wound continues to fill with granulation tissue. There is a little bit of slough and eschar accumulation. He continues to cut his wrap off of his feet and his edema control at the ankle is particularly poor with 3+ pitting edema. His insurance will not cover any sort of skin substitute unfortunately. 04/04/2022: The wound is smaller today. It is more superficial with good granulation tissue and just a little bit of slough. He continues to cut away portions of his wrap which results in inadequate edema control. 04/11/2022: His wound measured a couple millimeters larger today. It continues to fill with granulation tissue and there is  minimal slough on the surface. Electronic Signature(s) Signed: 04/11/2022 10:39:37 AM By: Duanne Guess MD FACS Entered By: Duanne Guess on 04/11/2022 10:39:37 -------------------------------------------------------------------------------- Physical Exam Details Patient Name: Date of Service: Anthony Barnes, Anthony Anthony E. 04/11/2022 10:00 A M Medical Record Number: [id number] Patient Account Number: [id number] Date of Birth/Sex: Treating RN: [birth year] (66 y.o. Anthony Barnes Primary Care Provider: Hoy Register Other Clinician: Referring Provider: Treating Provider/Extender: Camillo Flaming Weeks in Treatment: 14 Constitutional Hypertensive, asymptomatic. . . . No acute distress.Marland Kitchen Respiratory Normal work of breathing on room air.. Notes 04/11/2022: His wound measured a couple millimeters larger today. It continues to fill with granulation tissue and there is minimal slough on the surface. Electronic Signature(s) Signed: 04/11/2022 10:45:31 AM By: Duanne Guess MD FACS Previous Signature: 04/11/2022 10:42:11 AM Version By: Duanne Guess MD FACS Entered By: Duanne Guess on 04/11/2022 10:45:30 -------------------------------------------------------------------------------- Physician Orders Details Patient Name: Date of Service: Anthony Barnes, Anthony Anthony E. 04/11/2022 10:00 A M Medical Record Number: [id number] Patient Account Number: [id number] Date of Birth/Sex: Treating RN: 1956/07/10 (66 y.o. Anthony Barnes Primary Care Provider: Hoy Register Other Clinician: Referring Provider: Treating Provider/Extender: Camillo Flaming Weeks in Treatment: 39 Verbal / Phone Orders: No Diagnosis Coding ICD-10 Coding Code Description [phone] Non-pressure chronic ulcer of other part of right lower leg with fat layer exposed E11.622 Type 2 diabetes mellitus with other skin ulcer E11.9 Type 2 diabetes mellitus without complications I10 Essential  (primary) hypertension I87.2 Venous insufficiency (chronic) (peripheral) Follow-up Appointments ppointment in 1 week. - Dr. Lady Gary - room 2 - 9/6 at 8:30 AM Return A Anesthetic Wound #2 Right,Lateral Lower Leg (In clinic) Topical Lidocaine 5% applied to wound bed Bathing/ Shower/ Hygiene May shower with protection but do not get wound dressing(s) wet. - may purchase a cast protector from [facility] or [facility] Edema Control - Lymphedema / SCD / Other Elevate legs to the level of the heart or above for 30 minutes daily and/or when sitting, a frequency of: Avoid standing for long periods of time. Patient to wear own compression stockings every day. Exercise regularly Moisturize legs daily. Compression stocking or Garment 20-30 mm/Hg pressure to: - to L left until R leg is healed Wound Treatment Wound #2 - Lower Leg Wound Laterality: Right, Lateral Cleanser: Soap and Water 1 x Per Week/30 Days Discharge Instructions: May shower and wash wound with dial antibacterial soap and water prior to dressing change. Cleanser: Wound Cleanser  1 x Per Week/30 Days Discharge Instructions: Cleanse the wound with wound cleanser prior to applying a clean dressing using gauze sponges, not tissue or cotton balls. Peri-Wound Care: Sween Lotion (Moisturizing lotion) 1 x Per Week/30 Days Discharge Instructions: Apply moisturizing lotion as directed Prim Dressing: Hydrofera Blue Ready Foam, 4x5 in 1 x Per Week/30 Days ary Discharge Instructions: Apply to wound bed as instructed Secondary Dressing: Woven Gauze Sponge, Non-Sterile 4x4 in 1 x Per Week/30 Days Discharge Instructions: Apply over primary dressing as directed. Secured With: Coban Self-Adherent Wrap 4x5 (in/yd) 1 x Per Week/30 Days Discharge Instructions: Secure with Coban as directed. Secured With: [employer], 4.5x3.1 (in/yd) 1 x Per Week/30 Days Discharge Instructions: Secure with Kerlix as directed. Secured With: 68M Medipore H Soft Cloth  Surgical T ape, 4 x 10 (in/yd) 1 x Per Week/30 Days Discharge Instructions: Secure with tape as directed. Patient Medications llergies: Shellfish Containing Products, codeine A Notifications Medication Indication Start End 04/11/2022 lidocaine DOSE topical 5 % ointment - ointment topical Electronic Signature(s) Signed: 04/11/2022 10:59:42 AM By: Duanne Guess MD FACS Entered By: Duanne Guess on 04/11/2022 10:45:44 -------------------------------------------------------------------------------- Problem List Details Patient Name: Date of Service: Anthony Barnes, Anthony Anthony E. 04/11/2022 10:00 A M Medical Record Number: [id number] Patient Account Number: [id number] Date of Birth/Sex: Treating RN: Dec 22, 1955 (66 y.o. Anthony Barnes Primary Care Provider: Hoy Register Other Clinician: Referring Provider: Treating Provider/Extender: Camillo Flaming Weeks in Treatment: 14 Active Problems ICD-10 Encounter Code Description Active Date MDM Diagnosis L97.812 Non-pressure chronic ulcer of other part of right lower leg with fat layer 01/02/2022 No Yes exposed E11.622 Type 2 diabetes mellitus with other skin ulcer 01/02/2022 No Yes E11.9 Type 2 diabetes mellitus without complications 01/02/2022 No Yes I10 Essential (primary) hypertension 01/02/2022 No Yes I87.2 Venous insufficiency (chronic) (peripheral) 01/02/2022 No Yes Inactive Problems Resolved Problems Electronic Signature(s) Signed: 04/11/2022 10:37:13 AM By: Duanne Guess MD FACS Entered By: Duanne Guess on 04/11/2022 10:37:13 -------------------------------------------------------------------------------- Progress Note Details Patient Name: Date of Service: Anthony Barnes, Anthony Anthony E. 04/11/2022 10:00 A M Medical Record Number: [id number] Patient Account Number: [id number] Date of Birth/Sex: Treating RN: [birth year] (66 y.o. Anthony Barnes Primary Care Provider: Hoy Register Other  Clinician: Referring Provider: Treating Provider/Extender: Camillo Flaming Weeks in Treatment: 14 Subjective Chief Complaint Information obtained from Patient Right LE Ulcer History of Present Illness (HPI) ADMISSION 03/12/18 This is a 66 year old and it was a type II diabetic reasonably poorly controlled with a recent hemoglobin A1c of 8.2. He was tending to his lawn on 01/15/18 when his weed Anthony Barnes sent a rock up word hitting him in the right anterior leg. He was seen in his primary M.D. office is same time. An x-ray showed no abnormality. He was given a course of cephalexin. Since then he's been applying peroxide and topical antibiotics to the wound. He does not have a history of chronic wounds however he does have chronic edema in the lower legs. He is complaining of pain in the right leg wound but no real history of claudication. Past medical history includes type 2 diabetes, hypertension, morbid obesity. ABIs in the right leg were noncompressible 03/19/18 on evaluation today patient appears to be tolerating the wrap in general fairly well. He states he's not having that much pain in regard to the wound itself. With that being said he is having some issues with slough buildup on the surface of the wound the Iodoflex does seem to be helpful in that regard.  He is still having discomfort he wonders if I can send in a prescription for ibuprofen or something of like to help him out. I do believe that the prox end may be of benefit for him. 03/25/18 on evaluation today patient appears to be doing very well in regard to his right lateral lower [name]. He's been tolerating the dressing changes without complication that is the wraps. Nonetheless he does continue to have pain he states is been dreading the possibility of having to have debridement again today. His first debridement experience was not optimal. With that being said he does seem to be showing signs of  improvement there does appear to be little bit more granulation he does have a lot of slough that really does need to breeding away. 04/04/18;the patient went for arterial studies that were really quite normal. ABI on the right at 1.19 with triphasic waveforms on the left 1.11 with triphasic waveforms. TBI 0.93 on the right and 0.95 on the left. This suggests T should be able to tolerate even 4 layer compression. He is however complaining of a lot of pain with our wraps I'm wondering whether the pain is simply Iodoflex. I changed him to [employer] I'm still going to try to keep him in 3 layer compression 04/12/18 on evaluation today patient actually appears to be doing rather well in regard to his right lower [name] ulcer. He is making good progress although it is slow still I do believe that the Santyl is much better than the Iodoflex. The good news is the patient seems to be tolerating the dressing changes without complication now that we've gotten good of the Iodoflex which really did burn him quite significantly. Otherwise there's no evidence of infection. 04/17/18 on evaluation today patient actually appears to be showing signs of improvement in regard to the ulcer. Unfortunately he's had somewhat of a rough day due to the fact that he found out that one of his friends suddenly and unexpectedly passed today. He states he's not sure he's in the frame of mind to allow me to debride the wound at this point. Nonetheless I do feel like he is showing signs of the wound getting better little by little each week. 04/24/18 on evaluation today patient's ulcer actually appears to be showing some signs of improvement albeit slow. He has been tolerating the dressing changes without complication except for the wrap which she states was so tight that he had to remove it it was causing a lot of discomfort. Fortunately there is no evidence of infection at this point. He states he only wore the  wrap until about the time he got home last week and that he had to take it off. Nonetheless he does not have any other compression stockings, Juxta-Lite wraps, or anything otherwise to really help at this point as far as compression is concerned. 05/01/18 on evaluation today patient actually appears to be doing fairly well in regard to his lower extremity ulcer. He has been tolerating the dressing changes currently without complication. The wrap does seem to be helping as far as the fluid management is concerned. Wound bed itself actually show signs of good improvement although there is some Slough noted there's not as much as previous and he actually has fairly decent granulation noted as well. Overall I'm pleased with the progress of to this point. The patient would prefer not to have any debridement today he states he's actually not feeling too well  in general and he really does not want any additional discomfort typically debridement is fairly uncomfortable for him. 05/08/18 on evaluation today patient actually appears to be doing a little better in regard to his lower extremity ulcer. He has been tolerating the dressing changes without complication. With that being said I'm actually quite pleased with the fact the wound is not appear to be infected and in general has made a little progress. With that being said I do believe we need to perform some debridement to clean away the necrotic tissue on the surface of the wound. 05/15/18 on evaluation today patient actually appears to be doing a little better in regard to his wound. Fortunately there is some improvement noted fortunately there's also no significant evidence of infection at this point in time. He does have continued pain that really nothing different than previous he did get his Juxta-Lite wrap. 05/29/18 on evaluation today patient actually appears to be doing somewhat better in regard to his wound currently. He's been tolerating the dressing  changes without complication. With that being said he has been performing the [facility] Dressing changes every day at home. I'm pretty sure that we told him every other day last week or when I saw him rather two weeks ago. With that being said obviously did not hurt to do it daily just that obviously is gonna run out of supplies sooner and that could get him into trouble as far as his insurance is concerned. Nonetheless he at this point still continues to have pain although honestly I don't feel like it's as bad as what it was in the past just based on his reactions at this point. 06/12/18 on evaluation today patient actually appears to be doing better in regard to his lower [name] ulcer. He's been tolerating the dressing changes without complication. Fortunately there does not appear to be any evidence of infection at this time. No fevers, chills, nausea, or vomiting noted at this time. 06/19/18 evaluation today patient's ulcer on the lower extremity appears to be doing better at this point. he has been tolerating the dressing changes. The patient seems to be somewhat depressed about the progress of his wound although the overall appearance at this time seems to be doing well. In general I'm very pleased with the appearance today he does have some biofilm on the surface of the wound as well as of hyper granular tissue we may want to utilize a little bit of silver nitrate today. 06/27/2018; patient comes into clinic today for a wound on the right lateral calf likely related to chronic venous insufficiency. He has been using medihoney covered with Hydrofera Blue. Wound surface actually looks quite good 07/03/2018 seen today for follow-up and management of lower extremity ulcer right lateral shin. T olerating dressing changes. He has a small amount of biofilm today. Will attempt to remove a portion of the biofilm as he tolerates. He becomes very anxious with wound treatments. He would benefit from  layer compression wraps however he refuses compression wraps or anything that smokes his legs. He expressed an inability to tolerate compression wraps. Juxta lite wraps have been ordered. He has been instructed on the appropriate application of the juxta leg wraps. His blood pressure today on visit was 200/120. He denies any blurred vision, chest pain, dizziness, shortness of breath, or difficulty with mobility. Mr. Oki stated that he had a recent visit with his primary care provider. At that time his carvedilol was decreased from 25 mg daily  to 12.5 mg daily. Strongly encouraged Mr. Meinhardt to seek medical attention today during visit. He declined transport for evaluation of elevated blood pressure. Encouraged him to contact his primary care provider today for medication management. He stated that he would call his primary care doctor after wound visit today. Also encouraged him that if he experienced any symptoms of blurred vision, chest pain, shortness of breath to immediately seek medical attention. 07/17/18 on evaluation today patient's wound actually does seem to show signs of improvement based on the overall appearance of the wound bed today. There does not appear to show any signs of infection which is good news. There is no overall worsening which is also good news. He still has hyper granular tissue will use in the [employer] followed by [facility] Dressing this point. 07/31/18 on evaluation today patient's wound bed actually show signs of improvement with good epithelialization especially in the upper portion of the wound. He has been tolerating the dressing changes without complication in general. Overall I'm extremely happy with how things stand. No fevers, chills, nausea, or vomiting noted at this time. 08/28/18 on evaluation today patient appears to be doing a little better in regard to his lower extremity ulcer. With that being said it appears to be very hyper granular I think this  is directly attributed to the fact that he continues to use the [employer] underneath the [facility] Dressing. Although this helps not to stick unfortunately also think he's not getting the benefit of the [facility] Dressing particular and subsequently is causing too much moisture buildup hence the hyper granulation. Fortunately there does not appear to be any signs of infection at this time which is good news. 09/03/18 on evaluation today patient appears to be doing well in regard to his lower [name] ulcer. He has been tolerating the dressing changes without complication. Fortunately he has much less hyper granular tissue the noted last week I do think using silver nitrate in changing to using the [facility] Dressing without the mepitel has made a big difference for him this is good news. 09/18/18 on evaluation today patient appears to be doing excellent in regard to his right lower [name] ulcer. This is actually significantly smaller even compared to the last evaluation. Overall I'm very pleased in this regard. I'm a recommend that we likely repeat the silver nitrate today based on what I'm seeing. 10/02/18 on evaluation today patient appears to be doing excellent in regard to his lower extremity wound on the right. He's been tolerating the dressing changes without complication. Fortunately there's no signs of infection at this time. Overall been very pleased with how things seem to be progressing. No fevers, chills, nausea, or vomiting noted at this time. 10/16/18 on evaluation today patient actually appears to be doing much better in regard to his lower extremity wound on the right. This is shown signs of good improvement and is indeed measuring smaller he is on a excellent track as far as healing is concerned. My hope is this will be healed of the next several weeks. Fortunately there is no evidence of infection at this time. He does seem to be doing everything that I'm recommending for  him 10/30/18 on evaluation today patient appears to be doing better in regard to his lower extremity ulcer. He's been tolerating the dressing changes without complication. Fortunately the Hydrofera Blue Dressing to be doing the job. He has made excellent progress. With that being said this is still going somewhat slow but  nonetheless is always making good progress at this point. 5/18-Patient was in to be seen for right leg ulcer that appeared after scab above it was removed today. This area had healed completely. 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 The wound has not healed though it has been nearly 2 months since the injury occurred. He was seen in the emergency department at [facility] on May 9. At that time it was very painful and he described it as a "15 on a scale of 0oo10". He also was found to have 3+ pitting edema to the bilateral lower extremities. He was prescribed a weeks course of Keflex. He is here today because the wound has failed to heal and he has a prior history in our clinic. ABI in clinic today was noncompressible. He did have formal vascular studies performed in 2019 which were normal. The last hemoglobin A1c I have available for review was from March 21, 2021 and was elevated at 7.7%. The wound is fairly small and circular located about 3 inches above his right lateral malleolus. It is completely covered with eschar. No surrounding erythema, no odor, no purulent drainage. 01/11/2022: The wound on his right lateral leg is a little bit smaller today. It has reaccumulated some slough and a small bit of eschar. It remains fairly painful. He has another area on his anterior tibia that he will not let me  touch but I am concerned that there is a wound forming underneath the surface. 01/18/2022: Unfortunately, his wound is a little bit bigger today. He continues to accumulate slough on the wound surface. It is still quite tender. 01/25/2022: The patient bumped his wound on the car door which resulted in bleeding. He was seen at urgent care where it was redressed. Unfortunately, the wound is bigger again today. There is accumulated slough on the surface. Urgent care gave him some tramadol, apparently and he took this prior to his visit so he could tolerate debridement better. 02/02/2022: The wound is a little bit smaller today. The surface, however, has accumulated a fairly thick layer of slough. The periwound skin is intact and there is no obvious sign of infection. 02/07/2022: The wound is a bit larger today. It has thick slough accumulation. The periwound skin is intact and there is no obvious erythema, induration, nor any odor. 02/16/2022: I took a culture last week due to the appearance of the wound and the patient's degree of pain. This grew out methicillin sensitive Staph aureus. Augmentin was prescribed. The patient states that the pain is markedly improved today. The wound also looks much better. It is smaller with less slough buildup and there is good granulation tissue emerging. 02/21/2022: The patient's pain is quite improved from prior visits. The wound is about the same size and has some accumulated slough on the surface. The base is a little bit fibrotic but there are some areas of granulation tissue filling in. 02/28/2022: The wound is a little bit narrower on its measurements but slightly longer. There is still some slough accumulation on the surface, but he has minimal pain and there is no concern for ongoing infection. Granulation tissue is emerging. 03/07/2022: During the week, he cut holes in his wrap because  it was painful and too tight. As a result, his leg was quite a bit more swollen  today and his wound was larger. The wound surface is fairly clean but a little bit dry. Minimal slough accumulation. Granulation tissue present. 03/14/2022: Once again, he did some arts and crafts projects with his wrap. On the other hand, however, his wound does look better. It is smaller today and is beginning to fill in. Minimal slough and a small amount of eschar accumulation. 03/27/2022: His wound measured bigger today, although it does not appear to be so on my impression. The wound continues to fill with granulation tissue. There is a little bit of slough and eschar accumulation. He continues to cut his wrap off of his feet and his edema control at the ankle is particularly poor with 3+ pitting edema. His insurance will not cover any sort of skin substitute unfortunately. 04/04/2022: The wound is smaller today. It is more superficial with good granulation tissue and just a little bit of slough. He continues to cut away portions of his wrap which results in inadequate edema control. 04/11/2022: His wound measured a couple millimeters larger today. It continues to fill with granulation tissue and there is minimal slough on the surface. Patient History Information obtained from Patient. Family History Cancer - Mother, Diabetes - Mother,Father, Hypertension - Mother,Father, Kidney Disease - Siblings, Stroke - Father, No family history of Heart Disease, Hereditary Spherocytosis, Lung Disease, Seizures, Thyroid Problems, Tuberculosis. Social History Former smoker - ended on 08/14/1990, Marital Status - Married, Alcohol Use - Moderate, Drug Use - No History, Caffeine Use - Daily. Medical History Eyes Denies history of Cataracts, Glaucoma, Optic Neuritis Ear/Nose/Mouth/Throat Denies history of Chronic sinus problems/congestion, Middle ear problems Hematologic/Lymphatic Denies history of Anemia, Hemophilia, Human Immunodeficiency Virus, Lymphedema, Sickle Cell Disease Respiratory Denies history of  Aspiration, Asthma, Chronic Obstructive Pulmonary Disease (COPD), Pneumothorax, Sleep Apnea, Tuberculosis Cardiovascular Patient has history of Hypertension Denies history of Angina, Arrhythmia, Congestive Heart Failure, Coronary Artery Disease, Deep Vein Thrombosis, Hypotension, Myocardial Infarction, Peripheral Arterial Disease, Peripheral Venous Disease, Phlebitis, Vasculitis Gastrointestinal Denies history of Cirrhosis , Colitis, Crohnoos, Hepatitis A, Hepatitis B, Hepatitis C Endocrine Patient has history of Type II Diabetes Genitourinary Denies history of End Stage Renal Disease Immunological Denies history of Lupus Erythematosus, Raynaudoos, Scleroderma Integumentary (Skin) Denies history of History of Burn Musculoskeletal Denies history of Gout, Rheumatoid Arthritis, Osteoarthritis, Osteomyelitis Neurologic Denies history of Dementia, Neuropathy, Quadriplegia, Paraplegia, Seizure Disorder Oncologic Denies history of Received Chemotherapy, Received Radiation Psychiatric Denies history of Anorexia/bulimia, Confinement Anxiety Hospitalization/Surgery History - esophagogastroduodenoscopy. - brain aneurysm surgery. Medical A Surgical History Notes nd Constitutional Symptoms (General Health) obesity Gastrointestinal diverticulitis Neurologic neuropathy Objective Constitutional Hypertensive, asymptomatic. No acute distress.. Vitals Time Taken: 10:19 AM, Height: 75 in, Weight: 310 lbs, BMI: 38.7, Temperature: 97.9 F, Pulse: 71 bpm, Respiratory Rate: 18 breaths/min, Blood Pressure: 161/93 mmHg. Respiratory Normal work of breathing on room air.. General Notes: 04/11/2022: His wound measured a couple millimeters larger today. It continues to fill with granulation tissue and there is minimal slough on the surface. Integumentary (Hair, Skin) Wound #2 status is Open. Original cause of wound was Trauma. The date acquired was: 12/03/2021. The wound has been in treatment 14 weeks.  The wound is located on the Right,Lateral Lower Leg. The wound measures 4.7cm length x 1.8cm width x 0.1cm depth; 6.644cm^2 area and 0.664cm^3 volume. There is Fat Layer (Subcutaneous Tissue) exposed. There is no tunneling or undermining noted. There is a medium amount of  serosanguineous drainage noted. The wound margin is distinct with the outline attached to the wound base. There is large (67-100%) red granulation within the wound bed. There is a small (1-33%) amount of necrotic tissue within the wound bed including Adherent Slough. Assessment Active Problems ICD-10 Non-pressure chronic ulcer of other part of right lower leg with fat layer exposed Type 2 diabetes mellitus with other skin ulcer Type 2 diabetes mellitus without complications Essential (primary) hypertension Venous insufficiency (chronic) (peripheral) Procedures Wound #2 Pre-procedure diagnosis of Wound #2 is a Venous Leg Ulcer located on the Right,Lateral Lower Leg .Severity of Tissue Pre Debridement is: Fat layer exposed. There was a Selective/Open Wound Non-Viable Tissue Debridement with a total area of 8.46 sq cm performed by Duanne Guess, MD. With the following instrument(s): Curette to remove Non-Viable tissue/material. Material removed includes [facility] after achieving pain control using Lidocaine 5% topical ointment. No specimens were taken. A time out was conducted at 10:31, prior to the start of the procedure. A Minimum amount of bleeding was controlled with Pressure. The procedure was tolerated well with a pain level of 0 throughout and a pain level of 0 following the procedure. Post Debridement Measurements: 4.7cm length x 1.8cm width x 0.1cm depth; 0.664cm^3 volume. Character of Wound/Ulcer Post Debridement is improved. Severity of Tissue Post Debridement is: Fat layer exposed. Post procedure Diagnosis Wound #2: Same as Pre-Procedure Plan Follow-up Appointments: Return Appointment in 1 week. - Dr. Lady Gary - room  2 - 9/6 at 8:30 AM Anesthetic: Wound #2 Right,Lateral Lower Leg: (In clinic) Topical Lidocaine 5% applied to wound bed Bathing/ Shower/ Hygiene: May shower with protection but do not get wound dressing(s) wet. - may purchase a cast protector from [facility] or [facility] Edema Control - Lymphedema / SCD / Other: Elevate legs to the level of the heart or above for 30 minutes daily and/or when sitting, a frequency of: Avoid standing for long periods of time. Patient to wear own compression stockings every day. Exercise regularly Moisturize legs daily. Compression stocking or Garment 20-30 mm/Hg pressure to: - to L left until R leg is healed The following medication(s) was prescribed: lidocaine topical 5 % ointment ointment topical was prescribed at facility WOUND #2: - Lower Leg Wound Laterality: Right, Lateral Cleanser: Soap and Water 1 x Per Week/30 Days Discharge Instructions: May shower and wash wound with dial antibacterial soap and water prior to dressing change. Cleanser: Wound Cleanser 1 x Per Week/30 Days Discharge Instructions: Cleanse the wound with wound cleanser prior to applying a clean dressing using gauze sponges, not tissue or cotton balls. Peri-Wound Care: Sween Lotion (Moisturizing lotion) 1 x Per Week/30 Days Discharge Instructions: Apply moisturizing lotion as directed Prim Dressing: Hydrofera Blue Ready Foam, 4x5 in 1 x Per Week/30 Days ary Discharge Instructions: Apply to wound bed as instructed Secondary Dressing: Woven Gauze Sponge, Non-Sterile 4x4 in 1 x Per Week/30 Days Discharge Instructions: Apply over primary dressing as directed. Secured With: Coban Self-Adherent Wrap 4x5 (in/yd) 1 x Per Week/30 Days Discharge Instructions: Secure with Coban as directed. Secured With: [employer], 4.5x3.1 (in/yd) 1 x Per Week/30 Days Discharge Instructions: Secure with Kerlix as directed. Secured With: 68M Medipore H Soft Cloth Surgical T ape, 4 x 10 (in/yd) 1 x Per Week/30  Days Discharge Instructions: Secure with tape as directed. 04/11/2022: His wound measured a couple millimeters larger today. It continues to fill with granulation tissue and there is minimal slough on the surface. I used a curette to debride the slough  off of the wound surface. Unfortunately his insurance will not cover any skin substitutes. We have been using the [facility] ready foam for quite some time without significant improvement; he had a burning reaction to Prisma silver collagen so I am going to try plain calcium alginate. Continue Kerlix and Coban wrap with Unna boot at the top. Follow-up in 1 week. Electronic Signature(s) Signed: 04/11/2022 10:46:34 AM By: Duanne Guess MD FACS Entered By: Duanne Guess on 04/11/2022 10:46:33 -------------------------------------------------------------------------------- HxROS Details Patient Name: Date of Service: Anthony Barnes, Anthony Anthony E. 04/11/2022 10:00 A M Medical Record Number: [id number] Patient Account Number: [id number] Date of Birth/Sex: Treating RN: 06/29/1956 (66 y.o. Anthony Barnes Primary Care Provider: Hoy Register Other Clinician: Referring Provider: Treating Provider/Extender: Camillo Flaming Weeks in Treatment: 14 Information Obtained From Patient Constitutional Symptoms (General Health) Medical History: Past Medical History Notes: obesity Eyes Medical History: Negative for: Cataracts; Glaucoma; Optic Neuritis Ear/Nose/Mouth/Throat Medical History: Negative for: Chronic sinus problems/congestion; Middle ear problems Hematologic/Lymphatic Medical History: Negative for: Anemia; Hemophilia; Human Immunodeficiency Virus; Lymphedema; Sickle Cell Disease Respiratory Medical History: Negative for: Aspiration; Asthma; Chronic Obstructive Pulmonary Disease (COPD); Pneumothorax; Sleep Apnea; Tuberculosis Cardiovascular Medical History: Positive for: Hypertension Negative for: Angina;  Arrhythmia; Congestive Heart Failure; Coronary Artery Disease; Deep Vein Thrombosis; Hypotension; Myocardial Infarction; Peripheral Arterial Disease; Peripheral Venous Disease; Phlebitis; Vasculitis Gastrointestinal Medical History: Negative for: Cirrhosis ; Colitis; Crohns; Hepatitis A; Hepatitis B; Hepatitis C Past Medical History Notes: diverticulitis Endocrine Medical History: Positive for: Type II Diabetes Time with diabetes: 8 years Treated with: Oral agents Blood sugar tested every day: No Genitourinary Medical History: Negative for: End Stage Renal Disease Immunological Medical History: Negative for: Lupus Erythematosus; Raynauds; Scleroderma Integumentary (Skin) Medical History: Negative for: History of Burn Musculoskeletal Medical History: Negative for: Gout; Rheumatoid Arthritis; Osteoarthritis; Osteomyelitis Neurologic Medical History: Negative for: Dementia; Neuropathy; Quadriplegia; Paraplegia; Seizure Disorder Past Medical History Notes: neuropathy Oncologic Medical History: Negative for: Received Chemotherapy; Received Radiation Psychiatric Medical History: Negative for: Anorexia/bulimia; Confinement Anxiety Immunizations Pneumococcal Vaccine: Received Pneumococcal Vaccination: No Immunization Notes: tetanus 2 years ago Implantable Devices None Hospitalization / Surgery History Type of Hospitalization/Surgery esophagogastroduodenoscopy brain aneurysm surgery Family and Social History Cancer: Yes - Mother; Diabetes: Yes - Mother,Father; Heart Disease: No; Hereditary Spherocytosis: No; Hypertension: Yes - Mother,Father; Kidney Disease: Yes - Siblings; Lung Disease: No; Seizures: No; Stroke: Yes - Father; Thyroid Problems: No; Tuberculosis: No; Former smoker - ended on 08/14/1990; Marital Status - Married; Alcohol Use: Moderate; Drug Use: No History; Caffeine Use: Daily; Financial Concerns: No; Food, Clothing or Shelter Needs: No; Support System Lacking:  No; Transportation Concerns: No Electronic Signature(s) Signed: 04/11/2022 10:59:42 AM By: Duanne Guess MD FACS Signed: 04/11/2022 4:55:02 PM By: Gelene Mink By: Duanne Guess on 04/11/2022 10:42:03 -------------------------------------------------------------------------------- SuperBill Details Patient Name: Date of Service: Anthony Barnes, Anthony Anthony E. 04/11/2022 Medical Record Number: [id number] Patient Account Number: [id number] Date of Birth/Sex: Treating RN: [birth year] (66 y.o. Anthony Barnes Primary Care Provider: Hoy Register Other Clinician: Referring Provider: Treating Provider/Extender: Camillo Flaming Weeks in Treatment: 14 Diagnosis Coding ICD-10 Codes Code Description [phone] Non-pressure chronic ulcer of other part of right lower leg with fat layer exposed E11.622 Type 2 diabetes mellitus with other skin ulcer E11.9 Type 2 diabetes mellitus without complications I10 Essential (primary) hypertension I87.2 Venous insufficiency (chronic) (peripheral) Facility Procedures CPT4 Code: [id number] Description: [phone] - DEBRIDE WOUND 1ST 20 SQ CM OR < ICD-10 Diagnosis Description L97.812 Non-pressure chronic ulcer of other part of  right lower leg with fat layer expos Modifier: ed Quantity: 1 Physician Procedures : CPT4 Code Description Modifier [id number] - WC PHYS LEVEL 3 - EST PT 25 ICD-10 Diagnosis Description 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osed E11.622 Type 2 diabetes mellitus with other skin ulcer  I87.2 Venous insufficiency (chronic) (peripheral) I10 Essential (primary) hypertension Quantity: 1 : [id number] - WC PHYS DEBR WO ANESTH 20 SQ CM ICD-10 Diagnosis Description 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osed Quantity: 1 Electronic Signature(s) Signed: 04/11/2022 10:46:53 AM By: Duanne Guess MD FACS Entered By: Duanne Guess on 04/11/2022 10:46:52

## 2022-04-11 NOTE — Progress Notes (Signed)
KAZMIR, OKI ([id number]) Visit Report for 04/11/2022 Arrival Information Details Patient Name: Date of Service: Anthony Barnes [name] 04/11/2022 10:00 A M Medical Record Number: [id number] Patient Account Number: [id number] Date of Birth/Sex: Treating RN: 14-Feb-1956 (66 y.o. [name] Primary Care Aariya Ferrick: Charlott Rakes Other Clinician: Referring Lj Miyamoto: Treating Rakiyah Esch/Extender: Carter Kitten Weeks in Treatment: 14 Visit Information History Since Last Visit Added or deleted any medications: No Patient Arrived: Ambulatory Any new allergies or adverse reactions: No Arrival Time: 10:17 Had a fall or experienced change in No Accompanied By: self activities of daily living that may affect Transfer Assistance: None risk of falls: Patient Identification Verified: Yes Signs or symptoms of abuse/neglect since last visito No Secondary Verification Process Completed: Yes Hospitalized since last visit: No Patient Requires Transmission-Based Precautions: No Implantable device outside of the clinic excluding No Patient Has Alerts: No cellular tissue based products placed in the center since last visit: Has Dressing in Place as Prescribed: Yes Has Compression in Place as Prescribed: Yes Pain Present Now: No Electronic Signature(s) Signed: 04/11/2022 4:55:02 PM By: Adline Peals Entered By: Adline Peals on 04/11/2022 10:17:52 -------------------------------------------------------------------------------- Encounter Discharge Information Details Patient Name: Date of Service: Anthony Floor, [name] 04/11/2022 10:00 A M Medical Record Number: [id number] Patient Account Number: [id number] Date of Birth/Sex: Treating RN: [birth year] (66 y.o. [name] Primary Care Isha Seefeld: Charlott Rakes Other Clinician: Referring Aram Domzalski: Treating Robbie Nangle/Extender: Carter Kitten Weeks in Treatment: 14 Encounter Discharge  Information Items Post Procedure Vitals Discharge Condition: Stable Temperature (F): 97.9 Ambulatory Status: Ambulatory Pulse (bpm): 71 Discharge Destination: Home Respiratory Rate (breaths/min): 18 Transportation: Private Auto Blood Pressure (mmHg): 161/93 Accompanied By: self Schedule Follow-up Appointment: Yes Clinical Summary of Care: Patient Declined Electronic Signature(s) Signed: 04/11/2022 4:55:02 PM By: Adline Peals Entered By: Adline Peals on 04/11/2022 11:23:16 -------------------------------------------------------------------------------- Lower Extremity Assessment Details Patient Name: Date of Service: Anthony Barnes [name] 04/11/2022 10:00 A M Medical Record Number: [id number] Patient Account Number: [id number] Date of Birth/Sex: Treating RN: 08-23-1955 (66 y.o. [name] Primary Care Marks Scalera: Charlott Rakes Other Clinician: Referring Georgeanna Radziewicz: Treating Olivya Sobol/Extender: Carter Kitten Weeks in Treatment: 14 Edema Assessment Assessed: [Left: No] [Right: No] E[Left: dema] [Right: :] Calf Left: Right: Point of Measurement: From Medial Instep 43.5 cm Ankle Left: Right: Point of Measurement: From Medial Instep 27.5 cm Vascular Assessment Pulses: Dorsalis Pedis Palpable: [Right:Yes] Electronic Signature(s) Signed: 04/11/2022 4:55:02 PM By: Adline Peals Entered By: Adline Peals on 04/11/2022 10:24:32 -------------------------------------------------------------------------------- Multi Wound Chart Details Patient Name: Date of Service: Anthony Floor, [name] 04/11/2022 10:00 A M Medical Record Number: [id number] Patient Account Number: [id number] Date of Birth/Sex: Treating RN: Dec 10, 1955 (67 y.o. [name] Primary Care Dorianne Perret: Charlott Rakes Other Clinician: Referring Nickey Canedo: Treating Lanard Arguijo/Extender: Carter Kitten Weeks in Treatment: 14 Vital Signs Height(in):  75 Pulse(bpm): 71 Weight(lbs): 310 Blood Pressure(mmHg): 161/93 Body Mass Index(BMI): 38.7 Temperature(F): 97.9 Respiratory Rate(breaths/min): 18 Photos: [N/A:N/A] Right, Lateral Lower Leg N/A N/A Wound Location: Trauma N/A N/A Wounding Event: Venous Leg Ulcer N/A N/A Primary Etiology: Hypertension, Type II Diabetes N/A N/A Comorbid History: 12/03/2021 N/A N/A Date Acquired: 14 N/A N/A Weeks of Treatment: Open N/A N/A Wound Status: No N/A N/A Wound Recurrence: 4.7x1.8x0.1 N/A N/A Measurements L x W x D (cm) 6.644 N/A N/A A (cm) : rea 0.664 N/A N/A Volume (cm) : -526.80% N/A N/A % Reduction in A rea: -526.40% N/A N/A % Reduction in Volume: Full Thickness Without  Exposed N/A N/A Classification: Support Structures Medium N/A N/A Exudate A mount: Serosanguineous N/A N/A Exudate Type: red, brown N/A N/A Exudate Color: Distinct, outline attached N/A N/A Wound Margin: Large (67-100%) N/A N/A Granulation A mount: Red N/A N/A Granulation Quality: Small (1-33%) N/A N/A Necrotic A mount: Fat Layer (Subcutaneous Tissue): Yes N/A N/A Exposed Structures: Fascia: No Tendon: No Muscle: No Joint: No Bone: No Small (1-33%) N/A N/A Epithelialization: Debridement - Selective/Open Wound N/A N/A Debridement: Pre-procedure Verification/Time Out 10:31 N/A N/A Taken: Lidocaine 5% topical ointment N/A N/A Pain Control: Slough N/A N/A Tissue Debrided: Non-Viable Tissue N/A N/A Level: 8.46 N/A N/A Debridement A (sq cm): rea Curette N/A N/A Instrument: Minimum N/A N/A Bleeding: Pressure N/A N/A Hemostasis A chieved: 0 N/A N/A Procedural Pain: 0 N/A N/A Post Procedural Pain: Procedure was tolerated well N/A N/A Debridement Treatment Response: 4.7x1.8x0.1 N/A N/A Post Debridement Measurements L x W x D (cm) 0.664 N/A N/A Post Debridement Volume: (cm) Debridement N/A N/A Procedures Performed: Treatment Notes Electronic Signature(s) Signed: 04/11/2022  10:38:17 AM By: Fredirick Maudlin MD FACS Signed: 04/11/2022 4:55:02 PM By: Adline Peals Entered By: Fredirick Maudlin on 04/11/2022 10:38:17 -------------------------------------------------------------------------------- Multi-Disciplinary Care Plan Details Patient Name: Date of Service: Anthony Floor, [name] 04/11/2022 10:00 A M Medical Record Number: [id number] Patient Account Number: [id number] Date of Birth/Sex: Treating RN: 11/16/1955 (66 y.o. [name] Primary Care Stacyann Mcconaughy: Charlott Rakes Other Clinician: Referring Stefanee Mckell: Treating Quaneisha Hanisch/Extender: Carter Kitten Weeks in Treatment: 14 Active Inactive Venous Leg Ulcer Nursing Diagnoses: Actual venous Insuffiency (use after diagnosis is confirmed) Knowledge deficit related to disease process and management Goals: Patient will maintain optimal edema control Date Initiated: 01/02/2022 Target Resolution Date: 05/12/2022 Goal Status: Active Patient/caregiver will verbalize understanding of disease process and disease management Date Initiated: 01/02/2022 Date Inactivated: 03/07/2022 Target Resolution Date: 03/09/2022 Goal Status: Met Interventions: Assess peripheral edema status every visit. Compression as ordered Provide education on venous insufficiency Treatment Activities: Non-invasive vascular studies : 01/02/2022 T ordered outside of clinic : 01/02/2022 est Therapeutic compression applied : 01/02/2022 Notes: Wound/Skin Impairment Nursing Diagnoses: Impaired tissue integrity Knowledge deficit related to ulceration/compromised skin integrity Goals: Patient/caregiver will verbalize understanding of skin care regimen Date Initiated: 01/02/2022 Date Inactivated: 02/02/2022 Target Resolution Date: 02/03/2022 Goal Status: Met Ulcer/skin breakdown will have a volume reduction of 30% by week 4 Date Initiated: 01/02/2022 Target Resolution Date: 05/12/2022 Goal Status:  Active Interventions: Assess ulceration(s) every visit Provide education on ulcer and skin care Treatment Activities: Skin care regimen initiated : 01/02/2022 Topical wound management initiated : 01/02/2022 Notes: Electronic Signature(s) Signed: 04/11/2022 4:55:02 PM By: Adline Peals Entered By: Adline Peals on 04/11/2022 10:26:42 -------------------------------------------------------------------------------- Pain Assessment Details Patient Name: Date of Service: Anthony Floor, [place]. 04/11/2022 10:00 [place] Record Number: [id number] Patient Account Number: [id number] Date of Birth/Sex: Treating RN: June 01, 1956 (66 y.o. [name] Primary Care Ranyia Witting: Charlott Rakes Other Clinician: Referring Tasnia Spegal: Treating Mukund Weinreb/Extender: Carter Kitten Weeks in Treatment: 14 Active Problems Location of Pain Severity and Description of Pain Patient Has Paino No Site Locations Rate the pain. Rate the pain. Current Pain Level: 0 Pain Management and Medication Current Pain Management: Electronic Signature(s) Signed: 04/11/2022 4:55:02 PM By: Adline Peals Entered By: Adline Peals on 04/11/2022 10:19:19 -------------------------------------------------------------------------------- Patient/Caregiver Education Details Patient Name: Date of Service: Anthony Floor, [place]. 8/29/2023andnbsp10:00 A M Medical Record Number: [id number] Patient Account Number: [id number] Date of Birth/Gender: Treating RN: 1956/02/22 (66 y.o. [name] Primary Care  Physician: Charlott Rakes Other Clinician: Referring Physician: Treating Physician/Extender: Harriet Masson in Treatment: 14 Education Assessment Education Provided To: Patient Education Topics Provided Wound/Skin Impairment: Methods: Explain/Verbal Responses: Reinforcements needed, State content correctly Electronic Signature(s) Signed: 04/11/2022  4:55:02 PM By: Adline Peals Entered By: Adline Peals on 04/11/2022 10:26:53 -------------------------------------------------------------------------------- Wound Assessment Details Patient Name: Date of Service: Anthony Floor, [place]. 04/11/2022 10:00 A M Medical Record Number: [id number] Patient Account Number: [id number] Date of Birth/Sex: Treating RN: [birth year] (66 y.o. [name] Primary Care Elohim Brune: Charlott Rakes Other Clinician: Referring Judieth Mckown: Treating Aubriel Khanna/Extender: Carter Kitten Weeks in Treatment: 14 Wound Status Wound Number: 2 Primary Etiology: Venous Leg Ulcer Wound Location: Right, Lateral Lower Leg Wound Status: Open Wounding Event: Trauma Comorbid History: Hypertension, Type II Diabetes Date Acquired: 12/03/2021 Weeks Of Treatment: 14 Clustered Wound: No Photos Wound Measurements Length: (cm) 4.7 Width: (cm) 1.8 Depth: (cm) 0.1 Area: (cm) 6.644 Volume: (cm) 0.664 % Reduction in Area: -526.8% % Reduction in Volume: -526.4% Epithelialization: Small (1-33%) Tunneling: No Undermining: No Wound Description Classification: Full Thickness Without Exposed Support Structures Wound Margin: Distinct, outline attached Exudate Amount: Medium Exudate Type: Serosanguineous Exudate Color: red, brown Foul Odor After Cleansing: No Slough/Fibrino Yes Wound Bed Granulation Amount: Large (67-100%) Exposed Structure Granulation Quality: Red Fascia Exposed: No Necrotic Amount: Small (1-33%) Fat Layer (Subcutaneous Tissue) Exposed: Yes Necrotic Quality: Adherent Slough Tendon Exposed: No Muscle Exposed: No Joint Exposed: No Bone Exposed: No Treatment Notes Wound #2 (Lower Leg) Wound Laterality: Right, Lateral Cleanser Soap and Water Discharge Instruction: May shower and wash wound with dial antibacterial soap and water prior to dressing change. Wound Cleanser Discharge Instruction: Cleanse the wound with  wound cleanser prior to applying a clean dressing using gauze sponges, not tissue or cotton balls. Peri-Wound Care Sween Lotion (Moisturizing lotion) Discharge Instruction: Apply moisturizing lotion as directed Topical Primary Dressing Hydrofera Blue Ready Foam, 4x5 in Discharge Instruction: Apply to wound bed as instructed Secondary Dressing Woven Gauze Sponge, Non-Sterile 4x4 in Discharge Instruction: Apply over primary dressing as directed. Secured With [employer] 4x5 (in/yd) Discharge Instruction: Secure with Coban as directed. Kerlix Roll Sterile, 4.5x3.1 (in/yd) Discharge Instruction: Secure with Kerlix as directed. 13M Medipore H Soft Cloth Surgical T ape, 4 x 10 (in/yd) Discharge Instruction: Secure with tape as directed. Compression Wrap Compression Stockings Add-Ons Electronic Signature(s) Signed: 04/11/2022 4:55:02 PM By: Adline Peals Entered By: Adline Peals on 04/11/2022 10:26:20 -------------------------------------------------------------------------------- Vitals Details Patient Name: Date of Service: Anthony Floor, [place]. 04/11/2022 10:00 A M Medical Record Number: [id number] Patient Account Number: [id number] Date of Birth/Sex: Treating RN: [birth year] (66 y.o. [name] Primary Care Sheera Illingworth: Charlott Rakes Other Clinician: Referring Rubens Cranston: Treating Willies Laviolette/Extender: Carter Kitten Weeks in Treatment: 14 Vital Signs Time Taken: 10:19 Temperature (F): 97.9 Height (in): 75 Pulse (bpm): 71 Weight (lbs): 310 Respiratory Rate (breaths/min): 18 Body Mass Index (BMI): 38.7 Blood Pressure (mmHg): 161/93 Reference Range: 80 - 120 mg / dl Electronic Signature(s) Signed: 04/11/2022 4:55:02 PM By: Adline Peals Entered By: Adline Peals on 04/11/2022 10:19:13

## 2022-04-19 ENCOUNTER — Encounter (HOSPITAL_BASED_OUTPATIENT_CLINIC_OR_DEPARTMENT_OTHER): Payer: Medicare Other | Attending: General Surgery | Admitting: General Surgery

## 2022-04-19 DIAGNOSIS — I872 Venous insufficiency (chronic) (peripheral): Secondary | ICD-10-CM | POA: Insufficient documentation

## 2022-04-19 DIAGNOSIS — I10 Essential (primary) hypertension: Secondary | ICD-10-CM | POA: Diagnosis not present

## 2022-04-19 DIAGNOSIS — L97812 Non-pressure chronic ulcer of other part of right lower leg with fat layer exposed: Secondary | ICD-10-CM | POA: Diagnosis not present

## 2022-04-19 DIAGNOSIS — E11622 Type 2 diabetes mellitus with other skin ulcer: Secondary | ICD-10-CM | POA: Insufficient documentation

## 2022-04-19 NOTE — Progress Notes (Signed)
JOAQUIM, TOLEN ([id number]) Visit Report for 04/19/2022 Chief Complaint Document Details Patient Name: Date of Service: [name] 04/19/2022 8:30 A M Medical Record Number: [id number] Patient Account Number: [id number] Date of Birth/Sex: Treating RN: [birth year] (66 y.o. M) Primary Care Provider: Hoy Register Other Clinician: Referring Provider: Treating Provider/Extender: Camillo Flaming Weeks in Treatment: 15 Information Obtained from: Patient Chief Complaint Right LE Ulcer Electronic Signature(s) Signed: 04/19/2022 9:31:42 AM By: Duanne Guess MD FACS Entered By: Duanne Guess on 04/19/2022 09:31:41 -------------------------------------------------------------------------------- Debridement Details Patient Name: Date of Service: [name], [name] 04/19/2022 8:30 A M Medical Record Number: [id number] Patient Account Number: [id number] Date of Birth/Sex: Treating RN: 08/15/1955 (66 y.o. [name] Primary Care Provider: Hoy Register Other Clinician: Referring Provider: Treating Provider/Extender: Camillo Flaming Weeks in Treatment: 15 Debridement Performed for Assessment: Wound #2 Right,Lateral Lower Leg Performed By: Physician Duanne Guess, MD Debridement Type: Debridement Severity of Tissue Pre Debridement: Fat layer exposed Level of Consciousness (Pre-procedure): Awake and Alert Pre-procedure Verification/Time Out Yes - 08:58 Taken: Start Time: 08:58 Pain Control: Lidocaine 5% topical ointment T Area Debrided (L x W): otal 4.7 (cm) x 1.8 (cm) = 8.46 (cm) Tissue and other material debrided: Non-Viable, Slough, Slough Level: Non-Viable Tissue Debridement Description: Selective/Open Wound Instrument: Curette Bleeding: Minimum Hemostasis Achieved: Pressure Procedural Pain: 0 Post Procedural Pain: 0 Response to Treatment: Procedure was tolerated well Level of Consciousness (Post- Awake and  Alert procedure): Post Debridement Measurements of Total Wound Length: (cm) 4.7 Width: (cm) 1.8 Depth: (cm) 0.1 Volume: (cm) 0.664 Character of Wound/Ulcer Post Debridement: Improved Severity of Tissue Post Debridement: Fat layer exposed Post Procedure Diagnosis Same as Pre-procedure Electronic Signature(s) Signed: 04/19/2022 9:38:40 AM By: Duanne Guess MD FACS Signed: 04/19/2022 4:25:01 PM By: Samuella Bruin Entered By: Samuella Bruin on 04/19/2022 09:08:04 -------------------------------------------------------------------------------- HPI Details Patient Name: Date of Service: [name], [name] 04/19/2022 8:30 A M Medical Record Number: [id number] Patient Account Number: [id number] Date of Birth/Sex: Treating RN: [birth year] (66 y.o. M) Primary Care Provider: Hoy Register Other Clinician: Referring Provider: Treating Provider/Extender: Camillo Flaming Weeks in Treatment: 15 History of Present Illness HPI Description: ADMISSION 03/12/18 This is a 66 year old and it was a type II diabetic reasonably poorly controlled with a recent hemoglobin A1c of 8.2. He was tending to his lawn on 01/15/18 when his weed [name] sent a rock up word hitting him in the right anterior leg. He was seen in his primary M.D. office is same time. An x-ray showed no abnormality. He was given a course of cephalexin. Since then he's been applying peroxide and topical antibiotics to the wound. He does not have a history of chronic wounds however he does have chronic edema in the lower legs. He is complaining of pain in the right leg wound but no real history of claudication. Past medical history includes type 2 diabetes, hypertension, morbid obesity. ABIs in the right leg were noncompressible 03/19/18 on evaluation today patient appears to be tolerating the wrap in general fairly well. He states he's not having that much pain in regard to the wound itself. With that being said he is  having some issues with slough buildup on the surface of the wound the Iodoflex does seem to be helpful in that regard. He is still having discomfort he wonders if I can send in a prescription for ibuprofen or something of like to help him out. I do believe that the prox end may be  of benefit for him. 03/25/18 on evaluation today patient appears to be doing very well in regard to his right lateral lower [name]. He's been tolerating the dressing changes without complication that is the wraps. Nonetheless he does continue to have pain he states is been dreading the possibility of having to have debridement again today. His first debridement experience was not optimal. With that being said he does seem to be showing signs of improvement there does appear to be little bit more granulation he does have a lot of slough that really does need to breeding away. 04/04/18;the patient went for arterial studies that were really quite normal. ABI on the right at 1.19 with triphasic waveforms on the left 1.11 with triphasic waveforms. TBI 0.93 on the right and 0.95 on the left. This suggests T should be able to tolerate even 4 layer compression. He is however complaining of a lot of pain with our wraps I'm wondering whether the pain is simply Iodoflex. I changed him to [employer] I'm still going to try to keep him in 3 layer compression 04/12/18 on evaluation today patient actually appears to be doing rather well in regard to his right lower [name] ulcer. He is making good progress although it is slow still I do believe that the Santyl is much better than the Iodoflex. The good news is the patient seems to be tolerating the dressing changes without complication now that we've gotten good of the Iodoflex which really did burn him quite significantly. Otherwise there's no evidence of infection. 04/17/18 on evaluation today patient actually appears to be showing signs of improvement  in regard to the ulcer. Unfortunately he's had somewhat of a rough day due to the fact that he found out that one of his friends suddenly and unexpectedly passed today. He states he's not sure he's in the frame of mind to allow me to debride the wound at this point. Nonetheless I do feel like he is showing signs of the wound getting better little by little each week. 04/24/18 on evaluation today patient's ulcer actually appears to be showing some signs of improvement albeit slow. He has been tolerating the dressing changes without complication except for the wrap which she states was so tight that he had to remove it it was causing a lot of discomfort. Fortunately there is no evidence of infection at this point. He states he only wore the wrap until about the time he got home last week and that he had to take it off. Nonetheless he does not have any other compression stockings, Juxta-Lite wraps, or anything otherwise to really help at this point as far as compression is concerned. 05/01/18 on evaluation today patient actually appears to be doing fairly well in regard to his lower extremity ulcer. He has been tolerating the dressing changes currently without complication. The wrap does seem to be helping as far as the fluid management is concerned. Wound bed itself actually show signs of good improvement although there is some Slough noted there's not as much as previous and he actually has fairly decent granulation noted as well. Overall I'm pleased with the progress of to this point. The patient would prefer not to have any debridement today he states he's actually not feeling too well in general and he really does not want any additional discomfort typically debridement is fairly uncomfortable for him. 05/08/18 on evaluation today patient actually appears to be doing a little better in regard to  his lower extremity ulcer. He has been tolerating the dressing changes without complication. With that being  said I'm actually quite pleased with the fact the wound is not appear to be infected and in general has made a little progress. With that being said I do believe we need to perform some debridement to clean away the necrotic tissue on the surface of the wound. 05/15/18 on evaluation today patient actually appears to be doing a little better in regard to his wound. Fortunately there is some improvement noted fortunately there's also no significant evidence of infection at this point in time. He does have continued pain that really nothing different than previous he did get his Juxta-Lite wrap. 05/29/18 on evaluation today patient actually appears to be doing somewhat better in regard to his wound currently. He's been tolerating the dressing changes without complication. With that being said he has been performing the [facility] changes every day at home. I'm pretty sure that we told him every other day last week or when I saw him rather two weeks ago. With that being said obviously did not hurt to do it daily just that obviously is gonna run out of supplies sooner and that could get him into trouble as far as his insurance is concerned. Nonetheless he at this point still continues to have pain although honestly I don't feel like it's as bad as what it was in the past just based on his reactions at this point. 06/12/18 on evaluation today patient actually appears to be doing better in regard to his lower [name] ulcer. He's been tolerating the dressing changes without complication. Fortunately there does not appear to be any evidence of infection at this time. No fevers, chills, nausea, or vomiting noted at this time. 06/19/18 evaluation today patient's ulcer on the lower extremity appears to be doing better at this point. he has been tolerating the dressing changes. The patient seems to be somewhat depressed about the progress of his wound although the overall appearance at this time seems to  be doing well. In general I'm very pleased with the appearance today he does have some biofilm on the surface of the wound as well as of hyper granular tissue we may want to utilize a little bit of silver nitrate today. 06/27/2018; patient comes into clinic today for a wound on the right lateral calf likely related to chronic venous insufficiency. He has been using medihoney covered with Hydrofera Blue. Wound surface actually looks quite good 07/03/2018 seen today for follow-up and management of lower extremity ulcer right lateral shin. T olerating dressing changes. He has a small amount of biofilm today. Will attempt to remove a portion of the biofilm as he tolerates. He becomes very anxious with wound treatments. He would benefit from layer compression wraps however he refuses compression wraps or anything that smokes his legs. He expressed an inability to tolerate compression wraps. Juxta lite wraps have been ordered. He has been instructed on the appropriate application of the juxta leg wraps. His blood pressure today on visit was 200/120. He denies any blurred vision, chest pain, dizziness, shortness of breath, or difficulty with mobility. Mr. Loistine SimasGladney stated that he had a recent visit with his primary care provider. At that time his carvedilol was decreased from 25 mg daily to 12.5 mg daily. Strongly encouraged Mr. Loistine SimasGladney to seek medical attention today during visit. He declined transport for evaluation of elevated blood pressure. Encouraged him to contact his primary care provider today for  medication management. He stated that he would call his primary care doctor after wound visit today. Also encouraged him that if he experienced any symptoms of blurred vision, chest pain, shortness of breath to immediately seek medical attention. 07/17/18 on evaluation today patient's wound actually does seem to show signs of improvement based on the overall appearance of the wound bed today. There does not  appear to show any signs of infection which is good news. There is no overall worsening which is also good news. He still has hyper granular tissue will use in the [employer] followed by [facility] Dressing this point. 07/31/18 on evaluation today patient's wound bed actually show signs of improvement with good epithelialization especially in the upper portion of the wound. He has been tolerating the dressing changes without complication in general. Overall I'm extremely happy with how things stand. No fevers, chills, nausea, or vomiting noted at this time. 08/28/18 on evaluation today patient appears to be doing a little better in regard to his lower extremity ulcer. With that being said it appears to be very hyper granular I think this is directly attributed to the fact that he continues to use the [employer] underneath the [facility] Dressing. Although this helps not to stick unfortunately also think he's not getting the benefit of the [facility] Dressing particular and subsequently is causing too much moisture buildup hence the hyper granulation. Fortunately there does not appear to be any signs of infection at this time which is good news. 09/03/18 on evaluation today patient appears to be doing well in regard to his lower [name] ulcer. He has been tolerating the dressing changes without complication. Fortunately he has much less hyper granular tissue the noted last week I do think using silver nitrate in changing to using the [facility] Dressing without the mepitel has made a big difference for him this is good news. 09/18/18 on evaluation today patient appears to be doing excellent in regard to his right lower [name] ulcer. This is actually significantly smaller even compared to the last evaluation. Overall I'm very pleased in this regard. I'm a recommend that we likely repeat the silver nitrate today based on what I'm seeing. 10/02/18 on evaluation today patient appears to be doing  excellent in regard to his lower extremity wound on the right. He's been tolerating the dressing changes without complication. Fortunately there's no signs of infection at this time. Overall been very pleased with how things seem to be progressing. No fevers, chills, nausea, or vomiting noted at this time. 10/16/18 on evaluation today patient actually appears to be doing much better in regard to his lower extremity wound on the right. This is shown signs of good improvement and is indeed measuring smaller he is on a excellent track as far as healing is concerned. My hope is this will be healed of the next several weeks. Fortunately there is no evidence of infection at this time. He does seem to be doing everything that I'm recommending for him 10/30/18 on evaluation today patient appears to be doing better in regard to his lower extremity ulcer. He's been tolerating the dressing changes without complication. Fortunately the Hydrofera Blue Dressing to be doing the job. He has made excellent progress. With that being said this is still going somewhat slow but nonetheless is always making good progress at this point. 5/18-Patient was in to be seen for right leg ulcer that appeared after scab above it was removed today. This area had healed completely. It  appears that no further action is required on this READMISSION 01/02/2022 This is a now 66 year old male with poorly controlled type 2 diabetes mellitus and chronic venous insufficiency who once again was performing lawn care when a rock was flung up by his weedeater and it struck him in the right lateral lower leg. The wound has not healed though it has been nearly 2 months since the injury occurred. He was seen in the emergency department at [facility] on May 9. At that time it was very painful and he described it as a "15 on a scale of 010". He also was found to have 3+ pitting edema to the bilateral lower extremities. He was prescribed a weeks course of  Keflex. He is here today because the wound has failed to heal and he has a prior history in our clinic. ABI in clinic today was noncompressible. He did have formal vascular studies performed in 2019 which were normal. The last hemoglobin A1c I have available for review was from March 21, 2021 and was elevated at 7.7%. The wound is fairly small and circular located about 3 inches above his right lateral malleolus. It is completely covered with eschar. No surrounding erythema, no odor, no purulent drainage. 01/11/2022: The wound on his right lateral leg is a little bit smaller today. It has reaccumulated some slough and a small bit of eschar. It remains fairly painful. He has another area on his anterior tibia that he will not let me touch but I am concerned that there is a wound forming underneath the surface. 01/18/2022: Unfortunately, his wound is a little bit bigger today. He continues to accumulate slough on the wound surface. It is still quite tender. 01/25/2022: The patient bumped his wound on the car door which resulted in bleeding. He was seen at urgent care where it was redressed. Unfortunately, the wound is bigger again today. There is accumulated slough on the surface. Urgent care gave him some tramadol, apparently and he took this prior to his visit so he could tolerate debridement better. 02/02/2022: The wound is a little bit smaller today. The surface, however, has accumulated a fairly thick layer of slough. The periwound skin is intact and there is no obvious sign of infection. 02/07/2022: The wound is a bit larger today. It has thick slough accumulation. The periwound skin is intact and there is no obvious erythema, induration, nor any odor. 02/16/2022: I took a culture last week due to the appearance of the wound and the patient's degree of pain. This grew out methicillin sensitive Staph aureus. Augmentin was prescribed. The patient states that the pain is markedly improved today. The wound  also looks much better. It is smaller with less slough buildup and there is good granulation tissue emerging. 02/21/2022: The patient's pain is quite improved from prior visits. The wound is about the same size and has some accumulated slough on the surface. The base is a little bit fibrotic but there are some areas of granulation tissue filling in. 02/28/2022: The wound is a little bit narrower on its measurements but slightly longer. There is still some slough accumulation on the surface, but he has minimal pain and there is no concern for ongoing infection. Granulation tissue is emerging. 03/07/2022: During the week, he cut holes in his wrap because it was painful and too tight. As a result, his leg was quite a bit more swollen today and his wound was larger. The wound surface is fairly clean but a little bit  dry. Minimal slough accumulation. Granulation tissue present. 03/14/2022: Once again, he did some arts and crafts projects with his wrap. On the other hand, however, his wound does look better. It is smaller today and is beginning to fill in. Minimal slough and a small amount of eschar accumulation. 03/27/2022: His wound measured bigger today, although it does not appear to be so on my impression. The wound continues to fill with granulation tissue. There is a little bit of slough and eschar accumulation. He continues to cut his wrap off of his feet and his edema control at the ankle is particularly poor with 3+ pitting edema. His insurance will not cover any sort of skin substitute unfortunately. 04/04/2022: The wound is smaller today. It is more superficial with good granulation tissue and just a little bit of slough. He continues to cut away portions of his wrap which results in inadequate edema control. 04/11/2022: His wound measured a couple millimeters larger today. It continues to fill with granulation tissue and there is minimal slough on the surface. 04/19/2022: No significant change in the  wound dimensions, but the surface is healthier and less fibrotic. Electronic Signature(s) Signed: 04/19/2022 9:32:10 AM By: Duanne Guess MD FACS Entered By: Duanne Guess on 04/19/2022 09:32:10 -------------------------------------------------------------------------------- Physical Exam Details Patient Name: Date of Service: [name], [name] 04/19/2022 8:30 A M Medical Record Number: [id number] Patient Account Number: [id number] Date of Birth/Sex: Treating RN: 1956-03-12 (67 y.o. M) Primary Care Provider: Hoy Register Other Clinician: Referring Provider: Treating Provider/Extender: Whitney Muse, Odette Horns Weeks in Treatment: 15 Constitutional Slightly hypertensive. Tachycardic, asymptomatic.. . . No acute distress.Marland Kitchen Respiratory Normal work of breathing on room air.. Notes 04/19/2022: No significant change in the wound dimensions, but the surface is healthier and less fibrotic. Electronic Signature(s) Signed: 04/19/2022 9:32:50 AM By: Duanne Guess MD FACS Entered By: Duanne Guess on 04/19/2022 09:32:50 -------------------------------------------------------------------------------- Physician Orders Details Patient Name: Date of Service: [name], [name] 04/19/2022 8:30 A M Medical Record Number: [id number] Patient Account Number: [id number] Date of Birth/Sex: Treating RN: 1956/02/01 (66 y.o. [name] Primary Care Provider: Hoy Register Other Clinician: Referring Provider: Treating Provider/Extender: Camillo Flaming Weeks in Treatment: 15 Verbal / Phone Orders: No Diagnosis Coding ICD-10 Coding Code Description [phone] Non-pressure chronic ulcer of other part of right lower leg with fat layer exposed E11.622 Type 2 diabetes mellitus with other skin ulcer E11.9 Type 2 diabetes mellitus without complications I10 Essential (primary) hypertension I87.2 Venous insufficiency (chronic) (peripheral) Follow-up  Appointments ppointment in 1 week. - Dr. Lady Gary - room 2 - 9/13 at 8:30 AM Return A Anesthetic Wound #2 Right,Lateral Lower Leg (In clinic) Topical Lidocaine 5% applied to wound bed Bathing/ Shower/ Hygiene May shower with protection but do not get wound dressing(s) wet. - may purchase a cast protector from [facility] or [facility] Edema Control - Lymphedema / SCD / Other Elevate legs to the level of the heart or above for 30 minutes daily and/or when sitting, a frequency of: Avoid standing for long periods of time. Patient to wear own compression stockings every day. Exercise regularly Moisturize legs daily. Compression stocking or Garment 20-30 mm/Hg pressure to: - to L left until R leg is healed Wound Treatment Wound #2 - Lower Leg Wound Laterality: Right, Lateral Cleanser: Soap and Water 1 x Per Week/30 Days Discharge Instructions: May shower and wash wound with dial antibacterial soap and water prior to dressing change. Cleanser: Wound Cleanser 1 x Per Week/30 Days Discharge Instructions: Cleanse  the wound with wound cleanser prior to applying a clean dressing using gauze sponges, not tissue or cotton balls. Peri-Wound Care: Sween Lotion (Moisturizing lotion) 1 x Per Week/30 Days Discharge Instructions: Apply moisturizing lotion as directed Prim Dressing: Hydrofera Blue Ready Foam, 4x5 in 1 x Per Week/30 Days ary Discharge Instructions: Apply to wound bed as instructed Secondary Dressing: Woven Gauze Sponge, Non-Sterile 4x4 in 1 x Per Week/30 Days Discharge Instructions: Apply over primary dressing as directed. Secured With: Coban Self-Adherent Wrap 4x5 (in/yd) 1 x Per Week/30 Days Discharge Instructions: Secure with Coban as directed. Secured With: [employer], 4.5x3.1 (in/yd) 1 x Per Week/30 Days Discharge Instructions: Secure with Kerlix as directed. Secured With: 31M Medipore H Soft Cloth Surgical T ape, 4 x 10 (in/yd) 1 x Per Week/30 Days Discharge Instructions: Secure with  tape as directed. Patient Medications llergies: Shellfish Containing Products, codeine A Notifications Medication Indication Start End 04/19/2022 lidocaine DOSE topical 5 % ointment - ointment topical Electronic Signature(s) Signed: 04/19/2022 9:38:40 AM By: Duanne Guess MD FACS Entered By: Duanne Guess on 04/19/2022 09:33:10 -------------------------------------------------------------------------------- Problem List Details Patient Name: Date of Service: [name], [name] 04/19/2022 8:30 A M Medical Record Number: [id number] Patient Account Number: [id number] Date of Birth/Sex: Treating RN: Jul 17, 1956 (66 y.o. M) Primary Care Provider: Hoy Register Other Clinician: Referring Provider: Treating Provider/Extender: Whitney Muse, Odette Horns Weeks in Treatment: 15 Active Problems ICD-10 Encounter Code Description Active Date MDM Diagnosis L97.812 Non-pressure chronic ulcer of other part of right lower leg with fat layer 01/02/2022 No Yes exposed E11.622 Type 2 diabetes mellitus with other skin ulcer 01/02/2022 No Yes E11.9 Type 2 diabetes mellitus without complications 01/02/2022 No Yes I10 Essential (primary) hypertension 01/02/2022 No Yes I87.2 Venous insufficiency (chronic) (peripheral) 01/02/2022 No Yes Inactive Problems Resolved Problems Electronic Signature(s) Signed: 04/19/2022 9:31:24 AM By: Duanne Guess MD FACS Entered By: Duanne Guess on 04/19/2022 09:31:24 -------------------------------------------------------------------------------- Progress Note Details Patient Name: Date of Service: [name], [name] 04/19/2022 8:30 A M Medical Record Number: [id number] Patient Account Number: [id number] Date of Birth/Sex: Treating RN: [birth year] (66 y.o. M) Primary Care Provider: Hoy Register Other Clinician: Referring Provider: Treating Provider/Extender: Camillo Flaming Weeks in Treatment: 15 Subjective Chief  Complaint Information obtained from Patient Right LE Ulcer History of Present Illness (HPI) ADMISSION 03/12/18 This is a 66 year old and it was a type II diabetic reasonably poorly controlled with a recent hemoglobin A1c of 8.2. He was tending to his lawn on 01/15/18 when his weed [name] sent a rock up word hitting him in the right anterior leg. He was seen in his primary M.D. office is same time. An x-ray showed no abnormality. He was given a course of cephalexin. Since then he's been applying peroxide and topical antibiotics to the wound. He does not have a history of chronic wounds however he does have chronic edema in the lower legs. He is complaining of pain in the right leg wound but no real history of claudication. Past medical history includes type 2 diabetes, hypertension, morbid obesity. ABIs in the right leg were noncompressible 03/19/18 on evaluation today patient appears to be tolerating the wrap in general fairly well. He states he's not having that much pain in regard to the wound itself. With that being said he is having some issues with slough buildup on the surface of the wound the Iodoflex does seem to be helpful in that regard. He is still having discomfort he wonders if I can send in  a prescription for ibuprofen or something of like to help him out. I do believe that the prox end may be of benefit for him. 03/25/18 on evaluation today patient appears to be doing very well in regard to his right lateral lower [name]. He's been tolerating the dressing changes without complication that is the wraps. Nonetheless he does continue to have pain he states is been dreading the possibility of having to have debridement again today. His first debridement experience was not optimal. With that being said he does seem to be showing signs of improvement there does appear to be little bit more granulation he does have a lot of slough that really does need to breeding  away. 04/04/18;the patient went for arterial studies that were really quite normal. ABI on the right at 1.19 with triphasic waveforms on the left 1.11 with triphasic waveforms. TBI 0.93 on the right and 0.95 on the left. This suggests T should be able to tolerate even 4 layer compression. He is however complaining of a lot of pain with our wraps I'm wondering whether the pain is simply Iodoflex. I changed him to [employer] I'm still going to try to keep him in 3 layer compression 04/12/18 on evaluation today patient actually appears to be doing rather well in regard to his right lower [name] ulcer. He is making good progress although it is slow still I do believe that the Santyl is much better than the Iodoflex. The good news is the patient seems to be tolerating the dressing changes without complication now that we've gotten good of the Iodoflex which really did burn him quite significantly. Otherwise there's no evidence of infection. 04/17/18 on evaluation today patient actually appears to be showing signs of improvement in regard to the ulcer. Unfortunately he's had somewhat of a rough day due to the fact that he found out that one of his friends suddenly and unexpectedly passed today. He states he's not sure he's in the frame of mind to allow me to debride the wound at this point. Nonetheless I do feel like he is showing signs of the wound getting better little by little each week. 04/24/18 on evaluation today patient's ulcer actually appears to be showing some signs of improvement albeit slow. He has been tolerating the dressing changes without complication except for the wrap which she states was so tight that he had to remove it it was causing a lot of discomfort. Fortunately there is no evidence of infection at this point. He states he only wore the wrap until about the time he got home last week and that he had to take it off. Nonetheless he does not have any other compression  stockings, Juxta-Lite wraps, or anything otherwise to really help at this point as far as compression is concerned. 05/01/18 on evaluation today patient actually appears to be doing fairly well in regard to his lower extremity ulcer. He has been tolerating the dressing changes currently without complication. The wrap does seem to be helping as far as the fluid management is concerned. Wound bed itself actually show signs of good improvement although there is some Slough noted there's not as much as previous and he actually has fairly decent granulation noted as well. Overall I'm pleased with the progress of to this point. The patient would prefer not to have any debridement today he states he's actually not feeling too well in general and he really does not want any additional discomfort typically  debridement is fairly uncomfortable for him. 05/08/18 on evaluation today patient actually appears to be doing a little better in regard to his lower extremity ulcer. He has been tolerating the dressing changes without complication. With that being said I'm actually quite pleased with the fact the wound is not appear to be infected and in general has made a little progress. With that being said I do believe we need to perform some debridement to clean away the necrotic tissue on the surface of the wound. 05/15/18 on evaluation today patient actually appears to be doing a little better in regard to his wound. Fortunately there is some improvement noted fortunately there's also no significant evidence of infection at this point in time. He does have continued pain that really nothing different than previous he did get his Juxta-Lite wrap. 05/29/18 on evaluation today patient actually appears to be doing somewhat better in regard to his wound currently. He's been tolerating the dressing changes without complication. With that being said he has been performing the [facility] Dressing changes every day at home.  I'm pretty sure that we told him every other day last week or when I saw him rather two weeks ago. With that being said obviously did not hurt to do it daily just that obviously is gonna run out of supplies sooner and that could get him into trouble as far as his insurance is concerned. Nonetheless he at this point still continues to have pain although honestly I don't feel like it's as bad as what it was in the past just based on his reactions at this point. 06/12/18 on evaluation today patient actually appears to be doing better in regard to his lower [name] ulcer. He's been tolerating the dressing changes without complication. Fortunately there does not appear to be any evidence of infection at this time. No fevers, chills, nausea, or vomiting noted at this time. 06/19/18 evaluation today patient's ulcer on the lower extremity appears to be doing better at this point. he has been tolerating the dressing changes. The patient seems to be somewhat depressed about the progress of his wound although the overall appearance at this time seems to be doing well. In general I'm very pleased with the appearance today he does have some biofilm on the surface of the wound as well as of hyper granular tissue we may want to utilize a little bit of silver nitrate today. 06/27/2018; patient comes into clinic today for a wound on the right lateral calf likely related to chronic venous insufficiency. He has been using medihoney covered with Hydrofera Blue. Wound surface actually looks quite good 07/03/2018 seen today for follow-up and management of lower extremity ulcer right lateral shin. T olerating dressing changes. He has a small amount of biofilm today. Will attempt to remove a portion of the biofilm as he tolerates. He becomes very anxious with wound treatments. He would benefit from layer compression wraps however he refuses compression wraps or anything that smokes his legs. He expressed an inability to  tolerate compression wraps. Juxta lite wraps have been ordered. He has been instructed on the appropriate application of the juxta leg wraps. His blood pressure today on visit was 200/120. He denies any blurred vision, chest pain, dizziness, shortness of breath, or difficulty with mobility. Mr. Nyquist stated that he had a recent visit with his primary care provider. At that time his carvedilol was decreased from 25 mg daily to 12.5 mg daily. Strongly encouraged Mr. Grandison to seek medical attention  today during visit. He declined transport for evaluation of elevated blood pressure. Encouraged him to contact his primary care provider today for medication management. He stated that he would call his primary care doctor after wound visit today. Also encouraged him that if he experienced any symptoms of blurred vision, chest pain, shortness of breath to immediately seek medical attention. 07/17/18 on evaluation today patient's wound actually does seem to show signs of improvement based on the overall appearance of the wound bed today. There does not appear to show any signs of infection which is good news. There is no overall worsening which is also good news. He still has hyper granular tissue will use in the [employer] followed by [facility] Dressing this point. 07/31/18 on evaluation today patient's wound bed actually show signs of improvement with good epithelialization especially in the upper portion of the wound. He has been tolerating the dressing changes without complication in general. Overall I'm extremely happy with how things stand. No fevers, chills, nausea, or vomiting noted at this time. 08/28/18 on evaluation today patient appears to be doing a little better in regard to his lower extremity ulcer. With that being said it appears to be very hyper granular I think this is directly attributed to the fact that he continues to use the [employer] underneath the [facility] Dressing. Although this  helps not to stick unfortunately also think he's not getting the benefit of the [facility] Dressing particular and subsequently is causing too much moisture buildup hence the hyper granulation. Fortunately there does not appear to be any signs of infection at this time which is good news. 09/03/18 on evaluation today patient appears to be doing well in regard to his lower [name] ulcer. He has been tolerating the dressing changes without complication. Fortunately he has much less hyper granular tissue the noted last week I do think using silver nitrate in changing to using the [facility] Dressing without the mepitel has made a big difference for him this is good news. 09/18/18 on evaluation today patient appears to be doing excellent in regard to his right lower [name] ulcer. This is actually significantly smaller even compared to the last evaluation. Overall I'm very pleased in this regard. I'm a recommend that we likely repeat the silver nitrate today based on what I'm seeing. 10/02/18 on evaluation today patient appears to be doing excellent in regard to his lower extremity wound on the right. He's been tolerating the dressing changes without complication. Fortunately there's no signs of infection at this time. Overall been very pleased with how things seem to be progressing. No fevers, chills, nausea, or vomiting noted at this time. 10/16/18 on evaluation today patient actually appears to be doing much better in regard to his lower extremity wound on the right. This is shown signs of good improvement and is indeed measuring smaller he is on a excellent track as far as healing is concerned. My hope is this will be healed of the next several weeks. Fortunately there is no evidence of infection at this time. He does seem to be doing everything that I'm recommending for him 10/30/18 on evaluation today patient appears to be doing better in regard to his lower extremity ulcer. He's been tolerating the  dressing changes without complication. Fortunately the Hydrofera Blue Dressing to be doing the job. He has made excellent progress. With that being said this is still going somewhat slow but nonetheless is always making good progress at this point. 5/18-Patient was in  to be seen for right leg ulcer that appeared after scab above it was removed today. This area had healed completely. 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 The wound has not healed though it has been nearly 2 months since the injury occurred. He was seen in the emergency department at [facility] on May 9. At that time it was very painful and he described it as a "15 on a scale of 0oo10". He also was found to have 3+ pitting edema to the bilateral lower extremities. He was prescribed a weeks course of Keflex. He is here today because the wound has failed to heal and he has a prior history in our clinic. ABI in clinic today was noncompressible. He did have formal vascular studies performed in 2019 which were normal. The last hemoglobin A1c I have available for review was from March 21, 2021 and was elevated at 7.7%. The wound is fairly small and circular located about 3 inches above his right lateral malleolus. It is completely covered with eschar. No surrounding erythema, no odor, no purulent drainage. 01/11/2022: The wound on his right lateral leg is a little bit smaller today. It has reaccumulated some slough and a small bit of eschar. It remains fairly painful. He has another area on his anterior tibia that he will not let me touch but I am concerned that there is a wound forming underneath the surface. 01/18/2022: Unfortunately, his wound is a little bit  bigger today. He continues to accumulate slough on the wound surface. It is still quite tender. 01/25/2022: The patient bumped his wound on the car door which resulted in bleeding. He was seen at urgent care where it was redressed. Unfortunately, the wound is bigger again today. There is accumulated slough on the surface. Urgent care gave him some tramadol, apparently and he took this prior to his visit so he could tolerate debridement better. 02/02/2022: The wound is a little bit smaller today. The surface, however, has accumulated a fairly thick layer of slough. The periwound skin is intact and there is no obvious sign of infection. 02/07/2022: The wound is a bit larger today. It has thick slough accumulation. The periwound skin is intact and there is no obvious erythema, induration, nor any odor. 02/16/2022: I took a culture last week due to the appearance of the wound and the patient's degree of pain. This grew out methicillin sensitive Staph aureus. Augmentin was prescribed. The patient states that the pain is markedly improved today. The wound also looks much better. It is smaller with less slough buildup and there is good granulation tissue emerging. 02/21/2022: The patient's pain is quite improved from prior visits. The wound is about the same size and has some accumulated slough on the surface. The base is a little bit fibrotic but there are some areas of granulation tissue filling in. 02/28/2022: The wound is a little bit narrower on its measurements but slightly longer. There is still some slough accumulation on the surface, but he has minimal pain and there is no concern for ongoing infection. Granulation tissue is emerging. 03/07/2022: During the week, he cut holes in his wrap because it was painful and too tight. As a result, his leg was  quite a bit more swollen today and his wound was larger. The wound surface is fairly clean but a little bit dry. Minimal slough accumulation. Granulation  tissue present. 03/14/2022: Once again, he did some arts and crafts projects with his wrap. On the other hand, however, his wound does look better. It is smaller today and is beginning to fill in. Minimal slough and a small amount of eschar accumulation. 03/27/2022: His wound measured bigger today, although it does not appear to be so on my impression. The wound continues to fill with granulation tissue. There is a little bit of slough and eschar accumulation. He continues to cut his wrap off of his feet and his edema control at the ankle is particularly poor with 3+ pitting edema. His insurance will not cover any sort of skin substitute unfortunately. 04/04/2022: The wound is smaller today. It is more superficial with good granulation tissue and just a little bit of slough. He continues to cut away portions of his wrap which results in inadequate edema control. 04/11/2022: His wound measured a couple millimeters larger today. It continues to fill with granulation tissue and there is minimal slough on the surface. 04/19/2022: No significant change in the wound dimensions, but the surface is healthier and less fibrotic. Patient History Information obtained from Patient. Family History Cancer - Mother, Diabetes - Mother,Father, Hypertension - Mother,Father, Kidney Disease - Siblings, Stroke - Father, No family history of Heart Disease, Hereditary Spherocytosis, Lung Disease, Seizures, Thyroid Problems, Tuberculosis. Social History Former smoker - ended on 08/14/1990, Marital Status - Married, Alcohol Use - Moderate, Drug Use - No History, Caffeine Use - Daily. Medical History Eyes Denies history of Cataracts, Glaucoma, Optic Neuritis Ear/Nose/Mouth/Throat Denies history of Chronic sinus problems/congestion, Middle ear problems Hematologic/Lymphatic Denies history of Anemia, Hemophilia, Human Immunodeficiency Virus, Lymphedema, Sickle Cell Disease Respiratory Denies history of Aspiration, Asthma,  Chronic Obstructive Pulmonary Disease (COPD), Pneumothorax, Sleep Apnea, Tuberculosis Cardiovascular Patient has history of Hypertension Denies history of Angina, Arrhythmia, Congestive Heart Failure, Coronary Artery Disease, Deep Vein Thrombosis, Hypotension, Myocardial Infarction, Peripheral Arterial Disease, Peripheral Venous Disease, Phlebitis, Vasculitis Gastrointestinal Denies history of Cirrhosis , Colitis, Crohnoos, Hepatitis A, Hepatitis B, Hepatitis C Endocrine Patient has history of Type II Diabetes Genitourinary Denies history of End Stage Renal Disease Immunological Denies history of Lupus Erythematosus, Raynaudoos, Scleroderma Integumentary (Skin) Denies history of History of Burn Musculoskeletal Denies history of Gout, Rheumatoid Arthritis, Osteoarthritis, Osteomyelitis Neurologic Denies history of Dementia, Neuropathy, Quadriplegia, Paraplegia, Seizure Disorder Oncologic Denies history of Received Chemotherapy, Received Radiation Psychiatric Denies history of Anorexia/bulimia, Confinement Anxiety Hospitalization/Surgery History - esophagogastroduodenoscopy. - brain aneurysm surgery. Medical A Surgical History Notes nd Constitutional Symptoms (General Health) obesity Gastrointestinal diverticulitis Neurologic neuropathy Objective Constitutional Slightly hypertensive. Tachycardic, asymptomatic.Marland Kitchen No acute distress.. Vitals Time Taken: 8:38 AM, Height: 75 in, Weight: 310 lbs, BMI: 38.7, Temperature: 97.7 F, Pulse: 120 bpm, Respiratory Rate: 18 breaths/min, Blood Pressure: 146/93 mmHg. Respiratory Normal work of breathing on room air.. General Notes: 04/19/2022: No significant change in the wound dimensions, but the surface is healthier and less fibrotic. Integumentary (Hair, Skin) Wound #2 status is Open. Original cause of wound was Trauma. The date acquired was: 12/03/2021. The wound has been in treatment 15 weeks. The wound is located on the Right,Lateral  Lower Leg. The wound measures 4.7cm length x 1.8cm width x 0.1cm depth; 6.644cm^2 area and 0.664cm^3 volume. There is Fat Layer (Subcutaneous Tissue) exposed. There is no tunneling or undermining noted. There is a medium amount of serosanguineous drainage  noted. The wound margin is distinct with the outline attached to the wound base. There is large (67-100%) red granulation within the wound bed. There is a small (1-33%) amount of necrotic tissue within the wound bed including Adherent Slough. Assessment Active Problems ICD-10 Non-pressure chronic ulcer of other part of right lower leg with fat layer exposed Type 2 diabetes mellitus with other skin ulcer Type 2 diabetes mellitus without complications Essential (primary) hypertension Venous insufficiency (chronic) (peripheral) Procedures Wound #2 Pre-procedure diagnosis of Wound #2 is a Venous Leg Ulcer located on the Right,Lateral Lower Leg .Severity of Tissue Pre Debridement is: Fat layer exposed. There was a Selective/Open Wound Non-Viable Tissue Debridement with a total area of 8.46 sq cm performed by Duanne Guess, MD. With the following instrument(s): Curette to remove Non-Viable tissue/material. Material removed includes [facility] after achieving pain control using Lidocaine 5% topical ointment. No specimens were taken. A time out was conducted at 08:58, prior to the start of the procedure. A Minimum amount of bleeding was controlled with Pressure. The procedure was tolerated well with a pain level of 0 throughout and a pain level of 0 following the procedure. Post Debridement Measurements: 4.7cm length x 1.8cm width x 0.1cm depth; 0.664cm^3 volume. Character of Wound/Ulcer Post Debridement is improved. Severity of Tissue Post Debridement is: Fat layer exposed. Post procedure Diagnosis Wound #2: Same as Pre-Procedure Plan Follow-up Appointments: Return Appointment in 1 week. - Dr. Lady Gary - room 2 - 9/13 at 8:30 AM Anesthetic: Wound #2  Right,Lateral Lower Leg: (In clinic) Topical Lidocaine 5% applied to wound bed Bathing/ Shower/ Hygiene: May shower with protection but do not get wound dressing(s) wet. - may purchase a cast protector from [facility] or [facility] Edema Control - Lymphedema / SCD / Other: Elevate legs to the level of the heart or above for 30 minutes daily and/or when sitting, a frequency of: Avoid standing for long periods of time. Patient to wear own compression stockings every day. Exercise regularly Moisturize legs daily. Compression stocking or Garment 20-30 mm/Hg pressure to: - to L left until R leg is healed The following medication(s) was prescribed: lidocaine topical 5 % ointment ointment topical was prescribed at facility WOUND #2: - Lower Leg Wound Laterality: Right, Lateral Cleanser: Soap and Water 1 x Per Week/30 Days Discharge Instructions: May shower and wash wound with dial antibacterial soap and water prior to dressing change. Cleanser: Wound Cleanser 1 x Per Week/30 Days Discharge Instructions: Cleanse the wound with wound cleanser prior to applying a clean dressing using gauze sponges, not tissue or cotton balls. Peri-Wound Care: Sween Lotion (Moisturizing lotion) 1 x Per Week/30 Days Discharge Instructions: Apply moisturizing lotion as directed Prim Dressing: Hydrofera Blue Ready Foam, 4x5 in 1 x Per Week/30 Days ary Discharge Instructions: Apply to wound bed as instructed Secondary Dressing: Woven Gauze Sponge, Non-Sterile 4x4 in 1 x Per Week/30 Days Discharge Instructions: Apply over primary dressing as directed. Secured With: Coban Self-Adherent Wrap 4x5 (in/yd) 1 x Per Week/30 Days Discharge Instructions: Secure with Coban as directed. Secured With: [employer], 4.5x3.1 (in/yd) 1 x Per Week/30 Days Discharge Instructions: Secure with Kerlix as directed. Secured With: 107M Medipore H Soft Cloth Surgical T ape, 4 x 10 (in/yd) 1 x Per Week/30 Days Discharge Instructions: Secure with  tape as directed. 04/19/2022: No significant change in the wound dimensions, but the surface is healthier and less fibrotic. I used a curette to debride slough from the wound surface. We will continue to use calcium alginate  with Kerlix and Coban wrap. Follow-up in 1 week. Electronic Signature(s) Signed: 04/19/2022 9:33:40 AM By: Duanne Guess MD FACS Entered By: Duanne Guess on 04/19/2022 09:33:39 -------------------------------------------------------------------------------- HxROS Details Patient Name: Date of Service: [name], [name] 04/19/2022 8:30 A M Medical Record Number: [id number] Patient Account Number: [id number] Date of Birth/Sex: Treating RN: April 14, 1956 (66 y.o. M) Primary Care Provider: Hoy Register Other Clinician: Referring Provider: Treating Provider/Extender: Camillo Flaming Weeks in Treatment: 15 Information Obtained From Patient Constitutional Symptoms (General Health) Medical History: Past Medical History Notes: obesity Eyes Medical History: Negative for: Cataracts; Glaucoma; Optic Neuritis Ear/Nose/Mouth/Throat Medical History: Negative for: Chronic sinus problems/congestion; Middle ear problems Hematologic/Lymphatic Medical History: Negative for: Anemia; Hemophilia; Human Immunodeficiency Virus; Lymphedema; Sickle Cell Disease Respiratory Medical History: Negative for: Aspiration; Asthma; Chronic Obstructive Pulmonary Disease (COPD); Pneumothorax; Sleep Apnea; Tuberculosis Cardiovascular Medical History: Positive for: Hypertension Negative for: Angina; Arrhythmia; Congestive Heart Failure; Coronary Artery Disease; Deep Vein Thrombosis; Hypotension; Myocardial Infarction; Peripheral Arterial Disease; Peripheral Venous Disease; Phlebitis; Vasculitis Gastrointestinal Medical History: Negative for: Cirrhosis ; Colitis; Crohns; Hepatitis A; Hepatitis B; Hepatitis C Past Medical History Notes: diverticulitis Endocrine Medical  History: Positive for: Type II Diabetes Time with diabetes: 8 years Treated with: Oral agents Blood sugar tested every day: No Genitourinary Medical History: Negative for: End Stage Renal Disease Immunological Medical History: Negative for: Lupus Erythematosus; Raynauds; Scleroderma Integumentary (Skin) Medical History: Negative for: History of Burn Musculoskeletal Medical History: Negative for: Gout; Rheumatoid Arthritis; Osteoarthritis; Osteomyelitis Neurologic Medical History: Negative for: Dementia; Neuropathy; Quadriplegia; Paraplegia; Seizure Disorder Past Medical History Notes: neuropathy Oncologic Medical History: Negative for: Received Chemotherapy; Received Radiation Psychiatric Medical History: Negative for: Anorexia/bulimia; Confinement Anxiety Immunizations Pneumococcal Vaccine: Received Pneumococcal Vaccination: No Immunization Notes: tetanus 2 years ago Implantable Devices None Hospitalization / Surgery History Type of Hospitalization/Surgery esophagogastroduodenoscopy brain aneurysm surgery Family and Social History Cancer: Yes - Mother; Diabetes: Yes - Mother,Father; Heart Disease: No; Hereditary Spherocytosis: No; Hypertension: Yes - Mother,Father; Kidney Disease: Yes - Siblings; Lung Disease: No; Seizures: No; Stroke: Yes - Father; Thyroid Problems: No; Tuberculosis: No; Former smoker - ended on 08/14/1990; Marital Status - Married; Alcohol Use: Moderate; Drug Use: No History; Caffeine Use: Daily; Financial Concerns: No; Food, Clothing or Shelter Needs: No; Support System Lacking: No; Transportation Concerns: No Electronic Signature(s) Signed: 04/19/2022 9:38:40 AM By: Duanne Guess MD FACS Entered By: Duanne Guess on 04/19/2022 09:32:18 -------------------------------------------------------------------------------- SuperBill Details Patient Name: Date of Service: [name], [name] 04/19/2022 Medical Record Number: [id number] Patient  Account Number: [id number] Date of Birth/Sex: Treating RN: December 11, 1955 (66 y.o. M) Primary Care Provider: Hoy Register Other Clinician: Referring Provider: Treating Provider/Extender: Camillo Flaming Weeks in Treatment: 15 Diagnosis Coding ICD-10 Codes Code Description [phone] Non-pressure chronic ulcer of other part of right lower leg with fat layer exposed E11.622 Type 2 diabetes mellitus with other skin ulcer E11.9 Type 2 diabetes mellitus without complications I10 Essential (primary) hypertension I87.2 Venous insufficiency (chronic) (peripheral) Facility Procedures CPT4 Code: [id number] Description: [phone] - DEBRIDE WOUND 1ST 20 SQ CM OR < ICD-10 Diagnosis Description 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os Modifier: ed Quantity: 1 Physician Procedures : CPT4 Code Description Modifier [id number] - WC PHYS LEVEL 3 - EST PT 25 ICD-10 Diagnosis Description 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osed E11.622 Type 2 diabetes mellitus with other skin ulcer  I87.2 Venous insufficiency (chronic) (peripheral) I10 Essential (primary)  hypertension Quantity: 1 : [id number] - WC PHYS DEBR WO ANESTH 20 SQ CM ICD-10 Diagnosis Description 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osed Quantity: 1 Electronic Signature(s) Signed: 04/19/2022 9:34:22 AM By: Duanne Guess MD FACS Entered By: Duanne Guess on 04/19/2022 09:34:22

## 2022-04-19 NOTE — Progress Notes (Signed)
Anthony Barnes ([id number]) Visit Report for 04/19/2022 Arrival Information Details Patient Name: Date of Service: Anthony Barnes [name] 04/19/2022 8:30 A M Medical Record Number: [id number] Patient Account Number: [id number] Date of Birth/Sex: Treating RN: Aug 22, 1955 (66 y.o. [name] Primary Care Almarosa Bohac: Charlott Rakes Other Clinician: Referring Soloman Mckeithan: Treating Guillaume Weninger/Extender: Carter Kitten Weeks in Treatment: 15 Visit Information History Since Last Visit Added or deleted any medications: No Patient Arrived: Ambulatory Any new allergies or adverse reactions: No Arrival Time: 08:37 Had a fall or experienced change in No Accompanied By: self activities of daily living that may affect Transfer Assistance: None risk of falls: Patient Identification Verified: Yes Signs or symptoms of abuse/neglect since last visito No Secondary Verification Process Completed: Yes Hospitalized since last visit: No Patient Requires Transmission-Based Precautions: No Implantable device outside of the clinic excluding No Patient Has Alerts: No cellular tissue based products placed in the center since last visit: Has Dressing in Place as Prescribed: Yes Has Compression in Place as Prescribed: Yes Pain Present Now: No Electronic Signature(s) Signed: 04/19/2022 4:25:01 PM By: Adline Peals Entered By: Adline Peals on 04/19/2022 08:38:48 -------------------------------------------------------------------------------- Encounter Discharge Information Details Patient Name: Date of Service: Anthony Floor, [name] 04/19/2022 8:30 A M Medical Record Number: [id number] Patient Account Number: [id number] Date of Birth/Sex: Treating RN: [birth year] (66 y.o. [name] Primary Care Katrin Grabel: Charlott Rakes Other Clinician: Referring Julius Boniface: Treating Zaide Kardell/Extender: Carter Kitten Weeks in Treatment: 15 Encounter Discharge  Information Items Post Procedure Vitals Discharge Condition: Stable Temperature (F): 97.7 Ambulatory Status: Ambulatory Pulse (bpm): 120 Discharge Destination: Home Respiratory Rate (breaths/min): 18 Transportation: Private Auto Blood Pressure (mmHg): 146/93 Accompanied By: self Schedule Follow-up Appointment: Yes Clinical Summary of Care: Patient Declined Electronic Signature(s) Signed: 04/19/2022 4:25:01 PM By: Adline Peals Entered By: Adline Peals on 04/19/2022 09:55:22 -------------------------------------------------------------------------------- Lower Extremity Assessment Details Patient Name: Date of Service: Anthony Barnes [name] 04/19/2022 8:30 A M Medical Record Number: [id number] Patient Account Number: [id number] Date of Birth/Sex: Treating RN: 10-21-1955 (66 y.o. [name] Primary Care Hailyn Zarr: Charlott Rakes Other Clinician: Referring Sharran Caratachea: Treating Kagan Mutchler/Extender: Carter Kitten Weeks in Treatment: 15 Edema Assessment Assessed: [Left: No] [Right: No] E[Left: dema] [Right: :] Calf Left: Right: Point of Measurement: From Medial Instep 42.5 cm Ankle Left: Right: Point of Measurement: From Medial Instep 27 cm Vascular Assessment Pulses: Dorsalis Pedis Palpable: [Right:Yes] Electronic Signature(s) Signed: 04/19/2022 4:25:01 PM By: Adline Peals Entered By: Adline Peals on 04/19/2022 08:44:35 -------------------------------------------------------------------------------- Multi Wound Chart Details Patient Name: Date of Service: Anthony Floor, [name] 04/19/2022 8:30 A M Medical Record Number: [id number] Patient Account Number: [id number] Date of Birth/Sex: Treating RN: [birth year] (66 y.o. M) Primary Care Keaston Pile: Charlott Rakes Other Clinician: Referring Ariatna Jester: Treating Andera Cranmer/Extender: Carter Kitten Weeks in Treatment: 15 Vital Signs Height(in): 75 Pulse(bpm):  120 Weight(lbs): 310 Blood Pressure(mmHg): 146/93 Body Mass Index(BMI): 38.7 Temperature(F): 97.7 Respiratory Rate(breaths/min): 18 Photos: [N/A:N/A] Right, Lateral Lower Leg N/A N/A Wound Location: Trauma N/A N/A Wounding Event: Venous Leg Ulcer N/A N/A Primary Etiology: Hypertension, Type II Diabetes N/A N/A Comorbid History: 12/03/2021 N/A N/A Date Acquired: 15 N/A N/A Weeks of Treatment: Open N/A N/A Wound Status: No N/A N/A Wound Recurrence: 4.7x1.8x0.1 N/A N/A Measurements L x W x D (cm) 6.644 N/A N/A A (cm) : rea 0.664 N/A N/A Volume (cm) : -526.80% N/A N/A % Reduction in A rea: -526.40% N/A N/A % Reduction in Volume: Full Thickness Without Exposed N/A  N/A Classification: Support Structures Medium N/A N/A Exudate A mount: Serosanguineous N/A N/A Exudate Type: red, brown N/A N/A Exudate Color: Distinct, outline attached N/A N/A Wound Margin: Large (67-100%) N/A N/A Granulation A mount: Red N/A N/A Granulation Quality: Small (1-33%) N/A N/A Necrotic A mount: Fat Layer (Subcutaneous Tissue): Yes N/A N/A Exposed Structures: Fascia: No Tendon: No Muscle: No Joint: No Bone: No Small (1-33%) N/A N/A Epithelialization: Debridement - Selective/Open Wound N/A N/A Debridement: Pre-procedure Verification/Time Out 08:58 N/A N/A Taken: Lidocaine 5% topical ointment N/A N/A Pain Control: Slough N/A N/A Tissue Debrided: Non-Viable Tissue N/A N/A Level: 8.46 N/A N/A Debridement A (sq cm): rea Curette N/A N/A Instrument: Minimum N/A N/A Bleeding: Pressure N/A N/A Hemostasis A chieved: 0 N/A N/A Procedural Pain: 0 N/A N/A Post Procedural Pain: Procedure was tolerated well N/A N/A Debridement Treatment Response: 4.7x1.8x0.1 N/A N/A Post Debridement Measurements L x W x D (cm) 0.664 N/A N/A Post Debridement Volume: (cm) Debridement N/A N/A Procedures Performed: Treatment Notes Electronic Signature(s) Signed: 04/19/2022 9:31:31 AM By:  Fredirick Maudlin MD FACS Entered By: Fredirick Maudlin on 04/19/2022 09:31:30 -------------------------------------------------------------------------------- Multi-Disciplinary Care Plan Details Patient Name: Date of Service: Anthony Floor, [name] 04/19/2022 8:30 A M Medical Record Number: [id number] Patient Account Number: [id number] Date of Birth/Sex: Treating RN: [birth year] (66 y.o. [name] Primary Care Sharion Grieves: Charlott Rakes Other Clinician: Referring Taygen Newsome: Treating Betsabe Iglesia/Extender: Carter Kitten Weeks in Treatment: 15 Active Inactive Venous Leg Ulcer Nursing Diagnoses: Actual venous Insuffiency (use after diagnosis is confirmed) Knowledge deficit related to disease process and management Goals: Patient will maintain optimal edema control Date Initiated: 01/02/2022 Target Resolution Date: 05/12/2022 Goal Status: Active Patient/caregiver will verbalize understanding of disease process and disease management Date Initiated: 01/02/2022 Date Inactivated: 03/07/2022 Target Resolution Date: 03/09/2022 Goal Status: Met Interventions: Assess peripheral edema status every visit. Compression as ordered Provide education on venous insufficiency Treatment Activities: Non-invasive vascular studies : 01/02/2022 T ordered outside of clinic : 01/02/2022 est Therapeutic compression applied : 01/02/2022 Notes: Wound/Skin Impairment Nursing Diagnoses: Impaired tissue integrity Knowledge deficit related to ulceration/compromised skin integrity Goals: Patient/caregiver will verbalize understanding of skin care regimen Date Initiated: 01/02/2022 Date Inactivated: 02/02/2022 Target Resolution Date: 02/03/2022 Goal Status: Met Ulcer/skin breakdown will have a volume reduction of 30% by week 4 Date Initiated: 01/02/2022 Target Resolution Date: 05/12/2022 Goal Status: Active Interventions: Assess ulceration(s) every visit Provide education on ulcer and  skin care Treatment Activities: Skin care regimen initiated : 01/02/2022 Topical wound management initiated : 01/02/2022 Notes: Electronic Signature(s) Signed: 04/19/2022 4:25:01 PM By: Adline Peals Entered By: Adline Peals on 04/19/2022 08:45:32 -------------------------------------------------------------------------------- Pain Assessment Details Patient Name: Date of Service: Anthony Floor, [name] 04/19/2022 8:30 A M Medical Record Number: [id number] Patient Account Number: [id number] Date of Birth/Sex: Treating RN: Dec 09, 1955 (66 y.o. [name] Primary Care Dvid Pendry: Charlott Rakes Other Clinician: Referring Chloe Flis: Treating Chaun Uemura/Extender: Carter Kitten Weeks in Treatment: 15 Active Problems Location of Pain Severity and Description of Pain Patient Has Paino No Site Locations Rate the pain. Rate the pain. Current Pain Level: 0 Pain Management and Medication Current Pain Management: Electronic Signature(s) Signed: 04/19/2022 4:25:01 PM By: Adline Peals Entered By: Adline Peals on 04/19/2022 08:39:13 -------------------------------------------------------------------------------- Patient/Caregiver Education Details Patient Name: Date of Service: Anthony Floor, [place]. 9/6/2023andnbsp8:30 A M Medical Record Number: [id number] Patient Account Number: [id number] Date of Birth/Gender: Treating RN: [birth year] (66 y.o. [name] Primary Care Physician: Charlott Rakes Other Clinician: Referring Physician: Treating Physician/Extender:  Carter Kitten Weeks in Treatment: 15 Education Assessment Education Provided To: Patient Education Topics Provided Wound/Skin Impairment: Methods: Explain/Verbal Responses: Reinforcements needed, State content correctly [employer]) Signed: 04/19/2022 4:25:01 PM By: Adline Peals Entered By: Adline Peals on 04/19/2022  08:45:43 -------------------------------------------------------------------------------- Wound Assessment Details Patient Name: Date of Service: Anthony Floor, [name] 04/19/2022 8:30 A M Medical Record Number: [id number] Patient Account Number: [id number] Date of Birth/Sex: Treating RN: 11-23-1955 (66 y.o. [name] Primary Care Kenna Seward: Charlott Rakes Other Clinician: Referring Pernie Grosso: Treating Ilene Witcher/Extender: Carter Kitten Weeks in Treatment: 15 Wound Status Wound Number: 2 Primary Etiology: Venous Leg Ulcer Wound Location: Right, Lateral Lower Leg Wound Status: Open Wounding Event: Trauma Comorbid History: Hypertension, Type II Diabetes Date Acquired: 12/03/2021 Weeks Of Treatment: 15 Clustered Wound: No Photos Wound Measurements Length: (cm) 4.7 Width: (cm) 1.8 Depth: (cm) 0.1 Area: (cm) 6.644 Volume: (cm) 0.664 % Reduction in Area: -526.8% % Reduction in Volume: -526.4% Epithelialization: Small (1-33%) Tunneling: No Undermining: No Wound Description Classification: Full Thickness Without Exposed Support Structures Wound Margin: Distinct, outline attached Exudate Amount: Medium Exudate Type: Serosanguineous Exudate Color: red, brown Foul Odor After Cleansing: No Slough/Fibrino Yes Wound Bed Granulation Amount: Large (67-100%) Exposed Structure Granulation Quality: Red Fascia Exposed: No Necrotic Amount: Small (1-33%) Fat Layer (Subcutaneous Tissue) Exposed: Yes Necrotic Quality: Adherent Slough Tendon Exposed: No Muscle Exposed: No Joint Exposed: No Bone Exposed: No Treatment Notes Wound #2 (Lower Leg) Wound Laterality: Right, Lateral Cleanser Soap and Water Discharge Instruction: May shower and wash wound with dial antibacterial soap and water prior to dressing change. Wound Cleanser Discharge Instruction: Cleanse the wound with wound cleanser prior to applying a clean dressing using gauze sponges, not tissue or  cotton balls. Peri-Wound Care Sween Lotion (Moisturizing lotion) Discharge Instruction: Apply moisturizing lotion as directed Topical Primary Dressing Hydrofera Blue Ready Foam, 4x5 in Discharge Instruction: Apply to wound bed as instructed Secondary Dressing Woven Gauze Sponge, Non-Sterile 4x4 in Discharge Instruction: Apply over primary dressing as directed. Secured With [employer] 4x5 (in/yd) Discharge Instruction: Secure with Coban as directed. Kerlix Roll Sterile, 4.5x3.1 (in/yd) Discharge Instruction: Secure with Kerlix as directed. 60M Medipore H Soft Cloth Surgical T ape, 4 x 10 (in/yd) Discharge Instruction: Secure with tape as directed. Compression Wrap Compression Stockings Add-Ons Electronic Signature(s) Signed: 04/19/2022 4:25:01 PM By: Adline Peals Entered By: Adline Peals on 04/19/2022 08:47:45 -------------------------------------------------------------------------------- Vitals Details Patient Name: Date of Service: Anthony Floor, [name] 04/19/2022 8:30 A M Medical Record Number: [id number] Patient Account Number: [id number] Date of Birth/Sex: Treating RN: 1955/08/31 (66 y.o. [name] Primary Care Tobyn Osgood: Charlott Rakes Other Clinician: Referring Walida Cajas: Treating Sherril Shipman/Extender: Carter Kitten Weeks in Treatment: 15 Vital Signs Time Taken: 08:38 Temperature (F): 97.7 Height (in): 75 Pulse (bpm): 120 Weight (lbs): 310 Respiratory Rate (breaths/min): 18 Body Mass Index (BMI): 38.7 Blood Pressure (mmHg): 146/93 Reference Range: 80 - 120 mg / dl Electronic Signature(s) Signed: 04/19/2022 4:25:01 PM By: Adline Peals Entered By: Adline Peals on 04/19/2022 08:39:06

## 2022-04-26 ENCOUNTER — Encounter (HOSPITAL_BASED_OUTPATIENT_CLINIC_OR_DEPARTMENT_OTHER): Payer: Medicare Other | Admitting: General Surgery

## 2022-04-26 DIAGNOSIS — E11622 Type 2 diabetes mellitus with other skin ulcer: Secondary | ICD-10-CM | POA: Diagnosis not present

## 2022-04-26 DIAGNOSIS — I872 Venous insufficiency (chronic) (peripheral): Secondary | ICD-10-CM | POA: Diagnosis not present

## 2022-04-26 DIAGNOSIS — I10 Essential (primary) hypertension: Secondary | ICD-10-CM | POA: Diagnosis not present

## 2022-04-26 DIAGNOSIS — L97812 Non-pressure chronic ulcer of other part of right lower leg with fat layer exposed: Secondary | ICD-10-CM | POA: Diagnosis not present

## 2022-04-26 NOTE — Progress Notes (Signed)
[name], [name] ([id number]) Visit Report for 04/26/2022 Arrival Information Details Patient Name: Date of Service: Anthony Barnes [name] 04/26/2022 8:30 A M Medical Record Number: [id number] Patient Account Number: [id number] Date of Birth/Sex: Treating RN: [birth year] (66 y.o. Anthony Barnes Primary Care Anthony Barnes: Anthony Barnes Other Clinician: Referring Anthony Barnes: Treating Anthony Barnes/Extender: Anthony Barnes: 16 Visit Information History Since Last Visit Added or deleted any medications: No Patient Arrived: Ambulatory Any new allergies or adverse reactions: No Arrival Time: 08:38 Had a fall or experienced change in No Accompanied By: self activities of daily living that may affect Transfer Assistance: None risk of falls: Patient Identification Verified: Yes Signs or symptoms of abuse/neglect since last visito No Secondary Verification Process Completed: Yes Hospitalized since last visit: No Patient Requires Transmission-Based Precautions: No Implantable device outside of the clinic excluding No Patient Has Alerts: No cellular tissue based products placed in the center since last visit: Has Dressing in Place as Prescribed: Yes Has Compression in Place as Prescribed: Yes Pain Present Now: No Electronic Signature(s) Signed: 04/26/2022 4:54:27 PM By: Anthony Barnes Entered By: Anthony Barnes on 04/26/2022 08:40:09 -------------------------------------------------------------------------------- Encounter Discharge Information Details Patient Name: Date of Service: Anthony Barnes, [name] 04/26/2022 8:30 A M Medical Record Number: [id number] Patient Account Number: [id number] Date of Birth/Sex: Treating RN: Nov 17, 1955 (66 y.o. Anthony Barnes Primary Care Kenlee Maler: Anthony Barnes Other Clinician: Referring Anthony Barnes: Treating Anthony Barnes/Extender: Anthony Barnes: 80 Encounter Discharge  Information Items Post Procedure Vitals Discharge Condition: Stable Temperature (F): 98 Ambulatory Status: Ambulatory Pulse (bpm): 80 Discharge Destination: Home Respiratory Rate (breaths/min): 18 Transportation: Private Auto Blood Pressure (mmHg): 166/83 Accompanied By: self Schedule Follow-up Appointment: Yes Clinical Summary of Care: Patient Declined Electronic Signature(s) Signed: 04/26/2022 4:54:27 PM By: Anthony Barnes Entered By: Anthony Barnes on 04/26/2022 10:33:52 -------------------------------------------------------------------------------- Lower Extremity Assessment Details Patient Name: Date of Service: Anthony Barnes [name] 04/26/2022 8:30 A M Medical Record Number: [id number] Patient Account Number: [id number] Date of Birth/Sex: Treating RN: November 06, 1955 (66 y.o. Anthony Barnes Primary Care Kurstyn Larios: Anthony Barnes Other Clinician: Referring Anthony Barnes: Treating Anthony Barnes/Extender: Anthony Barnes: 16 Edema Assessment Assessed: [Left: No] [Right: No] E[Left: dema] [Right: :] Calf Left: Right: Point of Measurement: From Medial Instep 43.2 cm Ankle Left: Right: Point of Measurement: From Medial Instep 26.8 cm Vascular Assessment Pulses: Dorsalis Pedis Palpable: [Right:Yes] Electronic Signature(s) Signed: 04/26/2022 4:54:27 PM By: Anthony Barnes Entered By: Anthony Barnes on 04/26/2022 08:45:50 -------------------------------------------------------------------------------- Multi Wound Chart Details Patient Name: Date of Service: Anthony Barnes, [name] 04/26/2022 8:30 A M Medical Record Number: [id number] Patient Account Number: [id number] Date of Birth/Sex: Treating RN: 30-Jul-1956 (66 y.o. M) Primary Care Anthony Barnes: Anthony Barnes Other Clinician: Referring Anthony Barnes: Treating Anthony Barnes/Extender: Anthony Barnes: 16 Vital Signs Height(in): 75 Pulse(bpm):  72 Weight(lbs): 310 Blood Pressure(mmHg): 187/102 Body Mass Index(BMI): 38.7 Temperature(F): 98 Respiratory Rate(breaths/min): 18 Photos: [N/A:N/A] Right, Lateral Lower Leg N/A N/A Wound Location: Trauma N/A N/A Wounding Event: Venous Leg Ulcer N/A N/A Primary Etiology: Hypertension, Type II Diabetes N/A N/A Comorbid History: 12/03/2021 N/A N/A Date Acquired: 16 N/A N/A Weeks of Barnes: Open N/A N/A Wound Status: No N/A N/A Wound Recurrence: 4.8x2.2x0.1 N/A N/A Measurements L x W x D (cm) 8.294 N/A N/A A (cm) : rea 0.829 N/A N/A Volume (cm) : -682.50% N/A N/A % Reduction in A rea: -682.10% N/A N/A % Reduction in Volume: Full Thickness Without Exposed N/A  N/A Classification: Support Structures Medium N/A N/A Exudate A mount: Serosanguineous N/A N/A Exudate Type: red, brown N/A N/A Exudate Color: Distinct, outline attached N/A N/A Wound Margin: Medium (34-66%) N/A N/A Granulation A mount: Red N/A N/A Granulation Quality: Medium (34-66%) N/A N/A Necrotic A mount: Fat Layer (Subcutaneous Tissue): Yes N/A N/A Exposed Structures: Fascia: No Tendon: No Muscle: No Joint: No Bone: No Small (1-33%) N/A N/A Epithelialization: Debridement - Selective/Open Wound N/A N/A Debridement: Pre-procedure Verification/Time Out 08:56 N/A N/A Taken: Lidocaine 4% Topical Solution N/A N/A Pain Control: Slough N/A N/A Tissue Debrided: Non-Viable Tissue N/A N/A Level: 8.8 N/A N/A Debridement A (sq cm): rea Curette N/A N/A Instrument: Minimum N/A N/A Bleeding: Pressure N/A N/A Hemostasis A chieved: 8 N/A N/A Procedural Pain: 2 N/A N/A Post Procedural Pain: Procedure was tolerated well N/A N/A Debridement Barnes Response: 4.8x2.2x0.1 N/A N/A Post Debridement Measurements L x W x D (cm) 0.829 N/A N/A Post Debridement Volume: (cm) Debridement N/A N/A Procedures Performed: Barnes Notes Electronic Signature(s) Signed: 04/26/2022 9:13:49 AM By:  Anthony Maudlin MD FACS Entered By: Anthony Barnes on 04/26/2022 09:13:49 -------------------------------------------------------------------------------- Multi-Disciplinary Care Plan Details Patient Name: Date of Service: Anthony Barnes, [name] 04/26/2022 8:30 A M Medical Record Number: [id number] Patient Account Number: [id number] Date of Birth/Sex: Treating RN: June 17, 1956 (66 y.o. Anthony Barnes Primary Care Priya Matsen: Anthony Barnes Other Clinician: Referring Dennis Killilea: Treating Yasmeen Manka/Extender: Anthony Barnes: 16 Active Inactive Venous Leg Ulcer Nursing Diagnoses: Actual venous Insuffiency (use after diagnosis is confirmed) Knowledge deficit related to disease process and management Goals: Patient will maintain optimal edema control Date Initiated: 01/02/2022 Target Resolution Date: 05/12/2022 Goal Status: Active Patient/caregiver will verbalize understanding of disease process and disease management Date Initiated: 01/02/2022 Date Inactivated: 03/07/2022 Target Resolution Date: 03/09/2022 Goal Status: Met Interventions: Assess peripheral edema status every visit. Compression as ordered Provide education on venous insufficiency Barnes Activities: Non-invasive vascular studies : 01/02/2022 T ordered outside of clinic : 01/02/2022 est Therapeutic compression applied : 01/02/2022 Notes: Wound/Skin Impairment Nursing Diagnoses: Impaired tissue integrity Knowledge deficit related to ulceration/compromised skin integrity Goals: Patient/caregiver will verbalize understanding of skin care regimen Date Initiated: 01/02/2022 Date Inactivated: 02/02/2022 Target Resolution Date: 02/03/2022 Goal Status: Met Ulcer/skin breakdown will have a volume reduction of 30% by week 4 Date Initiated: 01/02/2022 Target Resolution Date: 05/12/2022 Goal Status: Active Interventions: Assess ulceration(s) every visit Provide education on ulcer and  skin care Barnes Activities: Skin care regimen initiated : 01/02/2022 Topical wound management initiated : 01/02/2022 Notes: Electronic Signature(s) Signed: 04/26/2022 4:54:27 PM By: Anthony Barnes Entered By: Anthony Barnes on 04/26/2022 08:48:22 -------------------------------------------------------------------------------- Pain Assessment Details Patient Name: Date of Service: Anthony Barnes, [name] 04/26/2022 8:30 A M Medical Record Number: [id number] Patient Account Number: [id number] Date of Birth/Sex: Treating RN: 23-Jun-1956 (65 y.o. Anthony Barnes Primary Care Maxine Huynh: Anthony Barnes Other Clinician: Referring Amir Fick: Treating Jermani Eberlein/Extender: Anthony Barnes: 16 Active Problems Location of Pain Severity and Description of Pain Patient Has Paino No Site Locations Rate the pain. Rate the pain. Current Pain Level: 0 Pain Management and Medication Current Pain Management: Electronic Signature(s) Signed: 04/26/2022 4:54:27 PM By: Anthony Barnes Entered By: Anthony Barnes on 04/26/2022 08:40:30 -------------------------------------------------------------------------------- Patient/Caregiver Education Details Patient Name: Date of Service: Anthony Barnes, [place]. 9/13/2023andnbsp8:30 A M Medical Record Number: [id number] Patient Account Number: [id number] Date of Birth/Gender: Treating RN: 26-Mar-1956 (66 y.o. Anthony Barnes Primary Care Physician: Anthony Barnes Other Clinician: Referring Physician: Treating Physician/Extender:  Anthony Barnes: 16 Education Assessment Education Provided To: Patient Education Topics Provided Wound/Skin Impairment: Methods: Explain/Verbal Responses: Reinforcements needed, State content correctly [employer]) Signed: 04/26/2022 4:54:27 PM By: Anthony Barnes Entered By: Anthony Barnes on 04/26/2022  08:48:34 -------------------------------------------------------------------------------- Wound Assessment Details Patient Name: Date of Service: Anthony Barnes, [name] 04/26/2022 8:30 A M Medical Record Number: [id number] Patient Account Number: [id number] Date of Birth/Sex: Treating RN: 05/15/1956 (66 y.o. Anthony Barnes Primary Care Juline Sanderford: Anthony Barnes Other Clinician: Referring Donne Robillard: Treating Bethany Hirt/Extender: Anthony Barnes: 16 Wound Status Wound Number: 2 Primary Etiology: Venous Leg Ulcer Wound Location: Right, Lateral Lower Leg Wound Status: Open Wounding Event: Trauma Comorbid History: Hypertension, Type II Diabetes Date Acquired: 12/03/2021 Weeks Of Barnes: 16 Clustered Wound: No Photos Wound Measurements Length: (cm) 4.8 Width: (cm) 2.2 Depth: (cm) 0.1 Area: (cm) 8.294 Volume: (cm) 0.829 % Reduction in Area: -682.5% % Reduction in Volume: -682.1% Epithelialization: Small (1-33%) Tunneling: No Undermining: No Wound Description Classification: Full Thickness Without Exposed Support Structures Wound Margin: Distinct, outline attached Exudate Amount: Medium Exudate Type: Serosanguineous Exudate Color: red, brown Foul Odor After Cleansing: No Slough/Fibrino Yes Wound Bed Granulation Amount: Medium (34-66%) Exposed Structure Granulation Quality: Red Fascia Exposed: No Necrotic Amount: Medium (34-66%) Fat Layer (Subcutaneous Tissue) Exposed: Yes Necrotic Quality: Adherent Slough Tendon Exposed: No Muscle Exposed: No Joint Exposed: No Bone Exposed: No Barnes Notes Wound #2 (Lower Leg) Wound Laterality: Right, Lateral Cleanser Soap and Water Discharge Instruction: May shower and wash wound with dial antibacterial soap and water prior to dressing change. Wound Cleanser Discharge Instruction: Cleanse the wound with wound cleanser prior to applying a clean dressing using gauze sponges, not tissue  or cotton balls. Peri-Wound Care Sween Lotion (Moisturizing lotion) Discharge Instruction: Apply moisturizing lotion as directed Topical Gentamicin Discharge Instruction: As directed by physician Primary Dressing Hydrofera Blue Ready Foam, 4x5 in Discharge Instruction: Apply to wound bed as instructed Secondary Dressing Woven Gauze Sponge, Non-Sterile 4x4 in Discharge Instruction: Apply over primary dressing as directed. Secured With [employer] 4x5 (in/yd) Discharge Instruction: Secure with Coban as directed. Kerlix Roll Sterile, 4.5x3.1 (in/yd) Discharge Instruction: Secure with Kerlix as directed. 39M Medipore H Soft Cloth Surgical T ape, 4 x 10 (in/yd) Discharge Instruction: Secure with tape as directed. Compression Wrap Compression Stockings Add-Ons Electronic Signature(s) Signed: 04/26/2022 4:54:27 PM By: Anthony Barnes Entered By: Anthony Barnes on 04/26/2022 08:50:02 -------------------------------------------------------------------------------- Vitals Details Patient Name: Date of Service: Anthony Barnes, [name] 04/26/2022 8:30 A M Medical Record Number: [id number] Patient Account Number: [id number] Date of Birth/Sex: Treating RN: 01-07-1956 (66 y.o. Anthony Barnes Primary Care Selinda Korzeniewski: Anthony Barnes Other Clinician: Referring Adarryl Goldammer: Treating Beulah Capobianco/Extender: Anthony Barnes: 16 Vital Signs Time Taken: 08:40 Temperature (F): 98 Height (in): 75 Pulse (bpm): 72 Weight (lbs): 310 Respiratory Rate (breaths/min): 18 Body Mass Index (BMI): 38.7 Blood Pressure (mmHg): 187/102 Reference Range: 80 - 120 mg / dl Electronic Signature(s) Signed: 04/26/2022 4:54:27 PM By: Anthony Barnes Entered By: Anthony Barnes on 04/26/2022 08:40:23

## 2022-04-27 NOTE — Progress Notes (Signed)
Anthony Barnes, Anthony Barnes ([id number]) Visit Report for 04/26/2022 Chief Complaint Document Details Patient Name: Date of Service: Anthony Barnes Anthony E. 04/26/2022 8:30 A M Medical Record Number: [id number] Patient Account Number: [id number] Date of Birth/Sex: Treating RN: 26-Oct-1955 (66 y.o. M) Primary Care Provider: Hoy Barnes Other Clinician: Referring Provider: Treating Provider/Extender: Anthony Barnes Weeks in Treatment: 16 Information Obtained from: Patient Chief Complaint Right LE Ulcer Electronic Signature(s) Signed: 04/26/2022 9:13:55 AM By: Anthony Guess MD FACS Entered By: Anthony Barnes on 04/26/2022 09:13:55 -------------------------------------------------------------------------------- Debridement Details Patient Name: Date of Service: Anthony Barnes, Anthony Anthony E. 04/26/2022 8:30 A M Medical Record Number: [id number] Patient Account Number: [id number] Date of Birth/Sex: Treating RN: 1956-08-12 (66 y.o. Anthony Barnes Primary Care Provider: Hoy Barnes Other Clinician: Referring Provider: Treating Provider/Extender: Anthony Barnes Weeks in Treatment: 16 Debridement Performed for Assessment: Wound #2 Right,Lateral Lower Leg Performed By: Physician Anthony Guess, MD Debridement Type: Debridement Severity of Tissue Pre Debridement: Fat layer exposed Level of Consciousness (Pre-procedure): Awake and Alert Pre-procedure Verification/Time Out Yes - 08:56 Taken: Start Time: 08:56 Pain Control: Lidocaine 4% T opical Solution T Area Debrided (L x W): otal 4 (cm) x 2.2 (cm) = 8.8 (cm) Tissue and other material debrided: Non-Viable, Slough, Slough Level: Non-Viable Tissue Debridement Description: Selective/Open Wound Instrument: Curette Bleeding: Minimum Hemostasis Achieved: Pressure Procedural Pain: 8 Post Procedural Pain: 2 Response to Treatment: Procedure was tolerated well Level of Consciousness (Post- Awake and  Alert procedure): Post Debridement Measurements of Total Wound Length: (cm) 4.8 Width: (cm) 2.2 Depth: (cm) 0.1 Volume: (cm) 0.829 Character of Wound/Ulcer Post Debridement: Improved Severity of Tissue Post Debridement: Fat layer exposed Post Procedure Diagnosis Same as Pre-procedure Notes scribed for Dr. Lady Barnes by Anthony Bruin, RN Electronic Signature(s) Signed: 04/26/2022 4:54:27 PM By: Anthony Barnes Signed: 04/27/2022 10:03:28 AM By: Anthony Guess MD FACS Previous Signature: 04/26/2022 10:10:05 AM Version By: Anthony Guess MD FACS Entered By: Anthony Barnes on 04/26/2022 16:35:46 -------------------------------------------------------------------------------- HPI Details Patient Name: Date of Service: Anthony Barnes, Anthony Anthony E. 04/26/2022 8:30 A M Medical Record Number: [id number] Patient Account Number: [id number] Date of Birth/Sex: Treating RN: October 27, 1955 (66 y.o. M)) Primary Care Provider: Hoy Barnes Other Clinician: Referring Provider: Treating Provider/Extender: Anthony Barnes Weeks in Treatment: 16 History of Present Illness HPI Description: ADMISSION 03/12/18 This is a 66 year old and Barnes was a type II diabetic reasonably poorly controlled with a recent hemoglobin A1c of 8.2. He was tending to his lawn on 01/15/18 when his weed Anthony Barnes sent a rock up word hitting him in the right anterior leg. He was seen in his primary M.D. office is same time. An x-ray showed no abnormality. He was given a course of cephalexin. Since then he's been applying peroxide and topical antibiotics to the wound. He does not have a history of chronic wounds however he does have chronic edema in the lower legs. He is complaining of pain in the right leg wound but no real history of claudication. Past medical history includes type 2 diabetes, hypertension, morbid obesity. ABIs in the right leg were noncompressible 03/19/18 on evaluation today patient appears to be  tolerating the wrap in general fairly well. He states he's not having that much pain in regard to the wound itself. With that being said he is having some issues with slough buildup on the surface of the wound the Iodoflex does seem to be helpful in that regard. He is still having discomfort he wonders if I can send in  a prescription for ibuprofen or something of like to help him out. I do believe that the prox end may be of benefit for him. 03/25/18 on evaluation today patient appears to be doing very well in regard to his right lateral lower Anthony Barnes extremity ulcer. He's been tolerating the dressing changes without complication that is the wraps. Nonetheless he does continue to have pain he states is been dreading the possibility of having to have debridement again today. His first debridement experience was not optimal. With that being said he does seem to be showing signs of improvement there does appear to be little bit more granulation he does have a lot of slough that really does need to breeding away. 04/04/18;the patient went for arterial studies that were really quite normal. ABI on the right at 1.19 with triphasic waveforms on the left 1.11 with triphasic waveforms. TBI 0.93 on the right and 0.95 on the left. This suggests T should be able to tolerate even 4 layer compression. He is however complaining of a lot of pain with our wraps I'm wondering whether the pain is simply Iodoflex. I changed him to [employer] I'm still going to try to keep him in 3 layer compression 04/12/18 on evaluation today patient actually appears to be doing rather well in regard to his right lower Anthony ulcer. He is making good progress although Barnes is slow still I do believe that the Santyl is much better than the Iodoflex. The good news is the patient seems to be tolerating the dressing changes without complication now that we've gotten good of the Iodoflex which really did burn him quite  significantly. Otherwise there's no evidence of infection. 04/17/18 on evaluation today patient actually appears to be showing signs of improvement in regard to the ulcer. Unfortunately he's had somewhat of a rough day due to the fact that he found out that one of his friends suddenly and unexpectedly passed today. He states he's not sure he's in the frame of mind to allow me to debride the wound at this point. Nonetheless I do feel like he is showing signs of the wound getting better little by little each week. 04/24/18 on evaluation today patient's ulcer actually appears to be showing some signs of improvement albeit slow. He has been tolerating the dressing changes without complication except for the wrap which she states was so tight that he had to remove Barnes Barnes was causing a lot of discomfort. Fortunately there is no evidence of infection at this point. He states he only wore the wrap until about the time he got home last week and that he had to take Barnes off. Nonetheless he does not have any other compression stockings, Juxta-Lite wraps, or anything otherwise to really help at this point as far as compression is concerned. 05/01/18 on evaluation today patient actually appears to be doing fairly well in regard to his lower extremity ulcer. He has been tolerating the dressing changes currently without complication. The wrap does seem to be helping as far as the fluid management is concerned. Wound bed itself actually show signs of good improvement although there is some Slough noted there's not as much as previous and he actually has fairly decent granulation noted as well. Overall I'm pleased with the progress of to this point. The patient would prefer not to have any debridement today he states he's actually not feeling too well in general and he really does not want any additional discomfort typically debridement  is fairly uncomfortable for him. 05/08/18 on evaluation today patient actually appears to be  doing a little better in regard to his lower extremity ulcer. He has been tolerating the dressing changes without complication. With that being said I'm actually quite pleased with the fact the wound is not appear to be infected and in general has made a little progress. With that being said I do believe we need to perform some debridement to clean away the necrotic tissue on the surface of the wound. 05/15/18 on evaluation today patient actually appears to be doing a little better in regard to his wound. Fortunately there is some improvement noted fortunately there's also no significant evidence of infection at this point in time. He does have continued pain that really nothing different than previous he did get his Juxta-Lite wrap. 05/29/18 on evaluation today patient actually appears to be doing somewhat better in regard to his wound currently. He's been tolerating the dressing changes without complication. With that being said he has been performing the [facility] Dressing changes every day at home. I'm pretty sure that we told him every other day last week or when I saw him rather two weeks ago. With that being said obviously did not hurt to do Barnes daily just that obviously is gonna run out of supplies sooner and that could get him into trouble as far as his insurance is concerned. Nonetheless he at this point still continues to have pain although honestly I don't feel like Barnes's as bad as what Barnes was in the past just based on his reactions at this point. 06/12/18 on evaluation today patient actually appears to be doing better in regard to his lower Anthony ulcer. He's been tolerating the dressing changes without complication. Fortunately there does not appear to be any evidence of infection at this time. No fevers, chills, nausea, or vomiting noted at this time. 06/19/18 evaluation today patient's ulcer on the lower extremity appears to be doing better at this point. he has been tolerating the  dressing changes. The patient seems to be somewhat depressed about the progress of his wound although the overall appearance at this time seems to be doing well. In general I'm very pleased with the appearance today he does have some biofilm on the surface of the wound as well as of hyper granular tissue we may want to utilize a little bit of silver nitrate today. 06/27/2018; patient comes into clinic today for a wound on the right lateral calf likely related to chronic venous insufficiency. He has been using medihoney covered with Hydrofera Blue. Wound surface actually looks quite good 07/03/2018 seen today for follow-up and management of lower extremity ulcer right lateral shin. T olerating dressing changes. He has a small amount of biofilm today. Will attempt to remove a portion of the biofilm as he tolerates. He becomes very anxious with wound treatments. He would benefit from layer compression wraps however he refuses compression wraps or anything that smokes his legs. He expressed an inability to tolerate compression wraps. Juxta lite wraps have been ordered. He has been instructed on the appropriate application of the juxta leg wraps. His blood pressure today on visit was 200/120. He denies any blurred vision, chest pain, dizziness, shortness of breath, or difficulty with mobility. Anthony Barnes stated that he had a recent visit with his primary care provider. At that time his carvedilol was decreased from 25 mg daily to 12.5 mg daily. Strongly encouraged Anthony Barnes to seek medical attention today  during visit. He declined transport for evaluation of elevated blood pressure. Encouraged him to contact his primary care provider today for medication management. He stated that he would call his primary care doctor after wound visit today. Also encouraged him that if he experienced any symptoms of blurred vision, chest pain, shortness of breath to immediately seek medical attention. 07/17/18 on  evaluation today patient's wound actually does seem to show signs of improvement based on the overall appearance of the wound bed today. There does not appear to show any signs of infection which is good news. There is no overall worsening which is also good news. He still has hyper granular tissue will use in the [employer] followed by [facility] Dressing this point. 07/31/18 on evaluation today patient's wound bed actually show signs of improvement with good epithelialization especially in the upper portion of the wound. He has been tolerating the dressing changes without complication in general. Overall I'm extremely happy with how things stand. No fevers, chills, nausea, or vomiting noted at this time. 08/28/18 on evaluation today patient appears to be doing a little better in regard to his lower extremity ulcer. With that being said Barnes appears to be very hyper granular I think this is directly attributed to the fact that he continues to use the [employer] underneath the [facility] Dressing. Although this helps not to stick unfortunately also think he's not getting the benefit of the [facility] Dressing particular and subsequently is causing too much moisture buildup hence the hyper granulation. Fortunately there does not appear to be any signs of infection at this time which is good news. 09/03/18 on evaluation today patient appears to be doing well in regard to his lower Anthony ulcer. He has been tolerating the dressing changes without complication. Fortunately he has much less hyper granular tissue the noted last week I do think using silver nitrate in changing to using the [facility] Dressing without the mepitel has made a big difference for him this is good news. 09/18/18 on evaluation today patient appears to be doing excellent in regard to his right lower Anthony ulcer. This is actually significantly smaller even compared to the last evaluation. Overall I'm very pleased in this  regard. I'm a recommend that we likely repeat the silver nitrate today based on what I'm seeing. 10/02/18 on evaluation today patient appears to be doing excellent in regard to his lower extremity wound on the right. He's been tolerating the dressing changes without complication. Fortunately there's no signs of infection at this time. Overall been very pleased with how things seem to be progressing. No fevers, chills, nausea, or vomiting noted at this time. 10/16/18 on evaluation today patient actually appears to be doing much better in regard to his lower extremity wound on the right. This is shown signs of good improvement and is indeed measuring smaller he is on a excellent track as far as healing is concerned. My hope is this will be healed of the next several weeks. Fortunately there is no evidence of infection at this time. He does seem to be doing everything that I'm recommending for him 10/30/18 on evaluation today patient appears to be doing better in regard to his lower extremity ulcer. He's been tolerating the dressing changes without complication. Fortunately the Hydrofera Blue Dressing to be doing the job. He has made excellent progress. With that being said this is still going somewhat slow but nonetheless is always making good progress at this point. 5/18-Patient was in to  be seen for right leg ulcer that appeared after scab above Barnes was removed today. This area had healed completely. 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 The wound has not healed though Barnes has been nearly 2 months since the injury occurred. He was seen in the emergency department at [facility] on May 9. At that time Barnes was very painful and he  described Barnes as a "15 on a scale of 010". He also was found to have 3+ pitting edema to the bilateral lower extremities. He was prescribed a weeks course of Keflex. He is here today because the wound has failed to heal and he has a prior history in our clinic. ABI in clinic today was noncompressible. He did have formal vascular studies performed in 2019 which were normal. The last hemoglobin A1c I have available for review was from March 21, 2021 and was elevated at 7.7%. The wound is fairly small and circular located about 3 inches above his right lateral malleolus. Barnes is completely covered with eschar. No surrounding erythema, no odor, no purulent drainage. 01/11/2022: The wound on his right lateral leg is a little bit smaller today. Barnes has reaccumulated some slough and a small bit of eschar. Barnes remains fairly painful. He has another area on his anterior tibia that he will not let me touch but I am concerned that there is a wound forming underneath the surface. 01/18/2022: Unfortunately, his wound is a little bit bigger today. He continues to accumulate slough on the wound surface. Barnes is still quite tender. 01/25/2022: The patient bumped his wound on the car door which resulted in bleeding. He was seen at urgent care where Barnes was redressed. Unfortunately, the wound is bigger again today. There is accumulated slough on the surface. Urgent care gave him some tramadol, apparently and he took this prior to his visit so he could tolerate debridement better. 02/02/2022: The wound is a little bit smaller today. The surface, however, has accumulated a fairly thick layer of slough. The periwound skin is intact and there is no obvious sign of infection. 02/07/2022: The wound is a bit larger today. Barnes has thick slough accumulation. The periwound skin is intact and there is no obvious erythema, induration, nor any odor. 02/16/2022: I took a culture last week due to the appearance of the wound and the patient's degree  of pain. This grew out methicillin sensitive Staph aureus. Augmentin was prescribed. The patient states that the pain is markedly improved today. The wound also looks much better. Barnes is smaller with less slough buildup and there is good granulation tissue emerging. 02/21/2022: The patient's pain is quite improved from prior visits. The wound is about the same size and has some accumulated slough on the surface. The base is a little bit fibrotic but there are some areas of granulation tissue filling in. 02/28/2022: The wound is a little bit narrower on its measurements but slightly longer. There is still some slough accumulation on the surface, but he has minimal pain and there is no concern for ongoing infection. Granulation tissue is emerging. 03/07/2022: During the week, he cut holes in his wrap because Barnes was painful and too tight. As a result, his leg was  quite a bit more swollen today and his wound was larger. The wound surface is fairly clean but a little bit dry. Minimal slough accumulation. Granulation tissue present. 03/14/2022: Once again, he did some arts and crafts projects with his wrap. On the other hand, however, his wound does look better. Barnes is smaller today and is beginning to fill in. Minimal slough and a small amount of eschar accumulation. 03/27/2022: His wound measured bigger today, although Barnes does not appear to be so on my impression. The wound continues to fill with granulation tissue. There is a little bit of slough and eschar accumulation. He continues to cut his wrap off of his feet and his edema control at the ankle is particularly poor with 3+ pitting edema. His insurance will not cover any sort of skin substitute unfortunately. 04/04/2022: The wound is smaller today. Barnes is more superficial with good granulation tissue and just a little bit of slough. He continues to cut away portions of his wrap which results in inadequate edema control. 04/11/2022: His wound measured a couple  millimeters larger today. Barnes continues to fill with granulation tissue and there is minimal slough on the surface. 04/19/2022: No significant change in the wound dimensions, but the surface is healthier and less fibrotic. 04/26/2022: The wound is a little bit bigger again today. There is slough accumulation on the surface. Edema control is inadequate. He has been cutting the foot portion of his wrap off each week. Electronic Signature(s) Signed: 04/26/2022 9:14:34 AM By: Anthony Guess MD FACS Entered By: Anthony Barnes on 04/26/2022 09:14:33 -------------------------------------------------------------------------------- Physical Exam Details Patient Name: Date of Service: Anthony Barnes, Anthony Anthony E. 04/26/2022 8:30 A M Medical Record Number: [id number] Patient Account Number: [id number] Date of Birth/Sex: Treating RN: Feb 17, 1956 (66 y.o. M) Primary Care Provider: Hoy Barnes Other Clinician: Referring Provider: Treating Provider/Extender: Anthony Barnes Weeks in Treatment: 16 Constitutional Hypertensive, asymptomatic. . . . No acute distress.Marland Kitchen Respiratory Normal work of breathing on room air.. Notes 04/26/2022: The wound is a little bit bigger again today. There is slough accumulation on the surface. Edema control is inadequate. Electronic Signature(s) Signed: 04/26/2022 9:15:02 AM By: Anthony Guess MD FACS Entered By: Anthony Barnes on 04/26/2022 09:15:02 -------------------------------------------------------------------------------- Physician Orders Details Patient Name: Date of Service: Anthony Barnes, Anthony Anthony E. 04/26/2022 8:30 A M Medical Record Number: [id number] Patient Account Number: [id number] Date of Birth/Sex: Treating RN: [birth year] (66 y.o. Anthony Barnes Primary Care Provider: Hoy Barnes Other Clinician: Referring Provider: Treating Provider/Extender: Anthony Barnes Weeks in Treatment: 11 Verbal / Phone Orders:  No Diagnosis Coding ICD-10 Coding Code Description [phone] Non-pressure chronic ulcer of other part of right lower leg with fat layer exposed E11.622 Type 2 diabetes mellitus with other skin ulcer E11.9 Type 2 diabetes mellitus without complications I10 Essential (primary) hypertension I87.2 Venous insufficiency (chronic) (peripheral) Follow-up Appointments ppointment in 1 week. - Dr. Lady Barnes - room 2 Return A Anesthetic Wound #2 Right,Lateral Lower Leg (In clinic) Topical Lidocaine 4% applied to wound bed Bathing/ Shower/ Hygiene May shower with protection but do not get wound dressing(s) wet. - may purchase a cast protector from [facility] or [facility] Edema Control - Lymphedema / SCD / Other Elevate legs to the level of the heart or above for 30 minutes daily and/or when sitting, a frequency of: Avoid standing for long periods of time. Patient to wear own compression stockings every day. Exercise regularly Moisturize legs daily. Compression stocking or Garment 20-30 mm/Hg pressure to: - to L  left until R leg is healed Wound Treatment Wound #2 - Lower Leg Wound Laterality: Right, Lateral Cleanser: Soap and Water 1 x Per Week/30 Days Discharge Instructions: May shower and wash wound with dial antibacterial soap and water prior to dressing change. Cleanser: Wound Cleanser 1 x Per Week/30 Days Discharge Instructions: Cleanse the wound with wound cleanser prior to applying a clean dressing using gauze sponges, not tissue or cotton balls. Peri-Wound Care: Sween Lotion (Moisturizing lotion) 1 x Per Week/30 Days Discharge Instructions: Apply moisturizing lotion as directed Topical: Gentamicin 1 x Per Week/30 Days Discharge Instructions: As directed by physician Prim Dressing: Hydrofera Blue Ready Foam, 4x5 in 1 x Per Week/30 Days ary Discharge Instructions: Apply to wound bed as instructed Secondary Dressing: Woven Gauze Sponge, Non-Sterile 4x4 in 1 x Per Week/30 Days Discharge Instructions:  Apply over primary dressing as directed. Secured With: Coban Self-Adherent Wrap 4x5 (in/yd) 1 x Per Week/30 Days Discharge Instructions: Secure with Coban as directed. Secured With: [employer], 4.5x3.1 (in/yd) 1 x Per Week/30 Days Discharge Instructions: Secure with Kerlix as directed. Secured With: 30M Medipore H Soft Cloth Surgical T ape, 4 x 10 (in/yd) 1 x Per Week/30 Days Discharge Instructions: Secure with tape as directed. Laboratory naerobe culture (MICRO) - PCR of non-healing wound to right lower leg Bacteria identified in Unspecified specimen by A LOINC Code: 635-3 Convenience Name: Anaerobic culture Patient Medications llergies: Shellfish Containing Products, codeine A Notifications Medication Indication Start End 04/26/2022 lidocaine DOSE topical 4 % cream - cream topical Electronic Signature(s) Signed: 04/26/2022 10:10:05 AM By: Anthony Guess MD FACS Entered By: Anthony Barnes on 04/26/2022 09:15:15 -------------------------------------------------------------------------------- Problem List Details Patient Name: Date of Service: Anthony Barnes, Anthony Anthony E. 04/26/2022 8:30 A M Medical Record Number: [id number] Patient Account Number: [id number] Date of Birth/Sex: Treating RN: [birth year] (66 y.o. M) Primary Care Provider: Hoy Barnes Other Clinician: Referring Provider: Treating Provider/Extender: Whitney Muse, Odette Horns Weeks in Treatment: 16 Active Problems ICD-10 Encounter Code Description Active Date MDM Diagnosis L97.812 Non-pressure chronic ulcer of other part of right lower leg with fat layer 01/02/2022 No Yes exposed E11.622 Type 2 diabetes mellitus with other skin ulcer 01/02/2022 No Yes E11.9 Type 2 diabetes mellitus without complications 01/02/2022 No Yes I10 Essential (primary) hypertension 01/02/2022 No Yes I87.2 Venous insufficiency (chronic) (peripheral) 01/02/2022 No Yes Inactive Problems Resolved Problems Electronic  Signature(s) Signed: 04/26/2022 9:13:42 AM By: Anthony Guess MD FACS Entered By: Anthony Barnes on 04/26/2022 09:13:42 -------------------------------------------------------------------------------- Progress Note Details Patient Name: Date of Service: Anthony Barnes, Anthony Anthony E. 04/26/2022 8:30 A M Medical Record Number: [id number] Patient Account Number: [id number] Date of Birth/Sex: Treating RN: Aug 11, 1956 (66 y.o. M) Primary Care Provider: Hoy Barnes Other Clinician: Referring Provider: Treating Provider/Extender: Anthony Barnes Weeks in Treatment: 16 Subjective Chief Complaint Information obtained from Patient Right LE Ulcer History of Present Illness (HPI) ADMISSION 03/12/18 This is a 66 year old and Barnes was a type II diabetic reasonably poorly controlled with a recent hemoglobin A1c of 8.2. He was tending to his lawn on 01/15/18 when his weed Anthony Barnes sent a rock up word hitting him in the right anterior leg. He was seen in his primary M.D. office is same time. An x-ray showed no abnormality. He was given a course of cephalexin. Since then he's been applying peroxide and topical antibiotics to the wound. He does not have a history of chronic wounds however he does have chronic edema in the lower legs. He is complaining of pain in the right  leg wound but no real history of claudication. Past medical history includes type 2 diabetes, hypertension, morbid obesity. ABIs in the right leg were noncompressible 03/19/18 on evaluation today patient appears to be tolerating the wrap in general fairly well. He states he's not having that much pain in regard to the wound itself. With that being said he is having some issues with slough buildup on the surface of the wound the Iodoflex does seem to be helpful in that regard. He is still having discomfort he wonders if I can send in a prescription for ibuprofen or something of like to help him out. I do believe that the prox end  may be of benefit for him. 03/25/18 on evaluation today patient appears to be doing very well in regard to his right lateral lower Anthony Barnes extremity ulcer. He's been tolerating the dressing changes without complication that is the wraps. Nonetheless he does continue to have pain he states is been dreading the possibility of having to have debridement again today. His first debridement experience was not optimal. With that being said he does seem to be showing signs of improvement there does appear to be little bit more granulation he does have a lot of slough that really does need to breeding away. 04/04/18;the patient went for arterial studies that were really quite normal. ABI on the right at 1.19 with triphasic waveforms on the left 1.11 with triphasic waveforms. TBI 0.93 on the right and 0.95 on the left. This suggests T should be able to tolerate even 4 layer compression. He is however complaining of a lot of pain with our wraps I'm wondering whether the pain is simply Iodoflex. I changed him to [employer] I'm still going to try to keep him in 3 layer compression 04/12/18 on evaluation today patient actually appears to be doing rather well in regard to his right lower Anthony ulcer. He is making good progress although Barnes is slow still I do believe that the Santyl is much better than the Iodoflex. The good news is the patient seems to be tolerating the dressing changes without complication now that we've gotten good of the Iodoflex which really did burn him quite significantly. Otherwise there's no evidence of infection. 04/17/18 on evaluation today patient actually appears to be showing signs of improvement in regard to the ulcer. Unfortunately he's had somewhat of a rough day due to the fact that he found out that one of his friends suddenly and unexpectedly passed today. He states he's not sure he's in the frame of mind to allow me to debride the wound at this point.  Nonetheless I do feel like he is showing signs of the wound getting better little by little each week. 04/24/18 on evaluation today patient's ulcer actually appears to be showing some signs of improvement albeit slow. He has been tolerating the dressing changes without complication except for the wrap which she states was so tight that he had to remove Barnes Barnes was causing a lot of discomfort. Fortunately there is no evidence of infection at this point. He states he only wore the wrap until about the time he got home last week and that he had to take Barnes off. Nonetheless he does not have any other compression stockings, Juxta-Lite wraps, or anything otherwise to really help at this point as far as compression is concerned. 05/01/18 on evaluation today patient actually appears to be doing fairly well in regard to his lower extremity ulcer. He has  been tolerating the dressing changes currently without complication. The wrap does seem to be helping as far as the fluid management is concerned. Wound bed itself actually show signs of good improvement although there is some Slough noted there's not as much as previous and he actually has fairly decent granulation noted as well. Overall I'm pleased with the progress of to this point. The patient would prefer not to have any debridement today he states he's actually not feeling too well in general and he really does not want any additional discomfort typically debridement is fairly uncomfortable for him. 05/08/18 on evaluation today patient actually appears to be doing a little better in regard to his lower extremity ulcer. He has been tolerating the dressing changes without complication. With that being said I'm actually quite pleased with the fact the wound is not appear to be infected and in general has made a little progress. With that being said I do believe we need to perform some debridement to clean away the necrotic tissue on the surface of the wound. 05/15/18  on evaluation today patient actually appears to be doing a little better in regard to his wound. Fortunately there is some improvement noted fortunately there's also no significant evidence of infection at this point in time. He does have continued pain that really nothing different than previous he did get his Juxta-Lite wrap. 05/29/18 on evaluation today patient actually appears to be doing somewhat better in regard to his wound currently. He's been tolerating the dressing changes without complication. With that being said he has been performing the [facility] Dressing changes every day at home. I'm pretty sure that we told him every other day last week or when I saw him rather two weeks ago. With that being said obviously did not hurt to do Barnes daily just that obviously is gonna run out of supplies sooner and that could get him into trouble as far as his insurance is concerned. Nonetheless he at this point still continues to have pain although honestly I don't feel like Barnes's as bad as what Barnes was in the past just based on his reactions at this point. 06/12/18 on evaluation today patient actually appears to be doing better in regard to his lower Anthony ulcer. He's been tolerating the dressing changes without complication. Fortunately there does not appear to be any evidence of infection at this time. No fevers, chills, nausea, or vomiting noted at this time. 06/19/18 evaluation today patient's ulcer on the lower extremity appears to be doing better at this point. he has been tolerating the dressing changes. The patient seems to be somewhat depressed about the progress of his wound although the overall appearance at this time seems to be doing well. In general I'm very pleased with the appearance today he does have some biofilm on the surface of the wound as well as of hyper granular tissue we may want to utilize a little bit of silver nitrate today. 06/27/2018; patient comes into clinic today for  a wound on the right lateral calf likely related to chronic venous insufficiency. He has been using medihoney covered with Hydrofera Blue. Wound surface actually looks quite good 07/03/2018 seen today for follow-up and management of lower extremity ulcer right lateral shin. T olerating dressing changes. He has a small amount of biofilm today. Will attempt to remove a portion of the biofilm as he tolerates. He becomes very anxious with wound treatments. He would benefit from layer compression wraps however he refuses compression  wraps or anything that smokes his legs. He expressed an inability to tolerate compression wraps. Juxta lite wraps have been ordered. He has been instructed on the appropriate application of the juxta leg wraps. His blood pressure today on visit was 200/120. He denies any blurred vision, chest pain, dizziness, shortness of breath, or difficulty with mobility. Anthony Barnes stated that he had a recent visit with his primary care provider. At that time his carvedilol was decreased from 25 mg daily to 12.5 mg daily. Strongly encouraged Anthony Barnes to seek medical attention today during visit. He declined transport for evaluation of elevated blood pressure. Encouraged him to contact his primary care provider today for medication management. He stated that he would call his primary care doctor after wound visit today. Also encouraged him that if he experienced any symptoms of blurred vision, chest pain, shortness of breath to immediately seek medical attention. 07/17/18 on evaluation today patient's wound actually does seem to show signs of improvement based on the overall appearance of the wound bed today. There does not appear to show any signs of infection which is good news. There is no overall worsening which is also good news. He still has hyper granular tissue will use in the [employer] followed by [facility] Dressing this point. 07/31/18 on evaluation today patient's wound bed  actually show signs of improvement with good epithelialization especially in the upper portion of the wound. He has been tolerating the dressing changes without complication in general. Overall I'm extremely happy with how things stand. No fevers, chills, nausea, or vomiting noted at this time. 08/28/18 on evaluation today patient appears to be doing a little better in regard to his lower extremity ulcer. With that being said Barnes appears to be very hyper granular I think this is directly attributed to the fact that he continues to use the [employer] underneath the [facility] Dressing. Although this helps not to stick unfortunately also think he's not getting the benefit of the [facility] Dressing particular and subsequently is causing too much moisture buildup hence the hyper granulation. Fortunately there does not appear to be any signs of infection at this time which is good news. 09/03/18 on evaluation today patient appears to be doing well in regard to his lower Anthony ulcer. He has been tolerating the dressing changes without complication. Fortunately he has much less hyper granular tissue the noted last week I do think using silver nitrate in changing to using the [facility] Dressing without the mepitel has made a big difference for him this is good news. 09/18/18 on evaluation today patient appears to be doing excellent in regard to his right lower Anthony ulcer. This is actually significantly smaller even compared to the last evaluation. Overall I'm very pleased in this regard. I'm a recommend that we likely repeat the silver nitrate today based on what I'm seeing. 10/02/18 on evaluation today patient appears to be doing excellent in regard to his lower extremity wound on the right. He's been tolerating the dressing changes without complication. Fortunately there's no signs of infection at this time. Overall been very pleased with how things seem to be progressing. No fevers, chills,  nausea, or vomiting noted at this time. 10/16/18 on evaluation today patient actually appears to be doing much better in regard to his lower extremity wound on the right. This is shown signs of good improvement and is indeed measuring smaller he is on a excellent track as far as healing is concerned. My hope is  this will be healed of the next several weeks. Fortunately there is no evidence of infection at this time. He does seem to be doing everything that I'm recommending for him 10/30/18 on evaluation today patient appears to be doing better in regard to his lower extremity ulcer. He's been tolerating the dressing changes without complication. Fortunately the Hydrofera Blue Dressing to be doing the job. He has made excellent progress. With that being said this is still going somewhat slow but nonetheless is always making good progress at this point. 5/18-Patient was in to be seen for right leg ulcer that appeared after scab above Barnes was removed today. This area had healed completely. 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 The wound has not healed though Barnes has been nearly 2 months since the injury occurred. He was [facility] in [facility] emergency department [facility] on May [facility]. At that time Barnes was very painful and he described Barnes as a "15 on a scale of 0oo10". He also was found to have 3+ pitting edema to the bilateral lower extremities. He was prescribed a weeks course of Keflex. He is here today because the wound has failed to heal and he has a prior history in our clinic. ABI in clinic today was noncompressible. He did have formal vascular studies performed in 2019 which were normal. The last hemoglobin A1c I have available for  review was from March 21, 2021 and was elevated at 7.7%. The wound is fairly small and circular located about 3 inches above his right lateral malleolus. Barnes is completely covered with eschar. No surrounding erythema, no odor, no purulent drainage. 01/11/2022: The wound on his right lateral leg is a little bit smaller today. Barnes has reaccumulated some slough and a small bit of eschar. Barnes remains fairly painful. He has another area on his anterior tibia that he will not let me touch but I am concerned that there is a wound forming underneath the surface. 01/18/2022: Unfortunately, his wound is a little bit bigger today. He continues to accumulate slough on the wound surface. Barnes is still quite tender. 01/25/2022: The patient bumped his wound on the car door which resulted in bleeding. He was seen at urgent care where Barnes was redressed. Unfortunately, the wound is bigger again today. There is accumulated slough on the surface. Urgent care gave him some tramadol, apparently and he took this prior to his visit so he could tolerate debridement better. 02/02/2022: The wound is a little bit smaller today. The surface, however, has accumulated a fairly thick layer of slough. The periwound skin is intact and there is no obvious sign of infection. 02/07/2022: The wound is a bit larger today. Barnes has thick slough accumulation. The periwound skin is intact and there is no obvious erythema, induration, nor any odor. 02/16/2022: I took a culture last week due to the appearance of the wound and the patient's degree of pain. This grew out methicillin sensitive Staph aureus. Augmentin was prescribed. The patient states that the pain is markedly improved today. The wound also looks much better. Barnes is smaller with less slough buildup and there is good granulation tissue emerging. 02/21/2022: The patient's pain is quite improved from prior visits. The  wound is about the same size and has some accumulated slough on the surface. The  base is a little bit fibrotic but there are some areas of granulation tissue filling in. 02/28/2022: The wound is a little bit narrower on its measurements but slightly longer. There is still some slough accumulation on the surface, but he has minimal pain and there is no concern for ongoing infection. Granulation tissue is emerging. 03/07/2022: During the week, he cut holes in his wrap because Barnes was painful and too tight. As a result, his leg was quite a bit more swollen today and his wound was larger. The wound surface is fairly clean but a little bit dry. Minimal slough accumulation. Granulation tissue present. 03/14/2022: Once again, he did some arts and crafts projects with his wrap. On the other hand, however, his wound does look better. Barnes is smaller today and is beginning to fill in. Minimal slough and a small amount of eschar accumulation. 03/27/2022: His wound measured bigger today, although Barnes does not appear to be so on my impression. The wound continues to fill with granulation tissue. There is a little bit of slough and eschar accumulation. He continues to cut his wrap off of his feet and his edema control at the ankle is particularly poor with 3+ pitting edema. His insurance will not cover any sort of skin substitute unfortunately. 04/04/2022: The wound is smaller today. Barnes is more superficial with good granulation tissue and just a little bit of slough. He continues to cut away portions of his wrap which results in inadequate edema control. 04/11/2022: His wound measured a couple millimeters larger today. Barnes continues to fill with granulation tissue and there is minimal slough on the surface. 04/19/2022: No significant change in the wound dimensions, but the surface is healthier and less fibrotic. 04/26/2022: The wound is a little bit bigger again today. There is slough accumulation on the surface. Edema control is inadequate. He has been cutting the foot portion of his wrap off each  week. Patient History Information obtained from Patient. Family History Cancer - Mother, Diabetes - Mother,Father, Hypertension - Mother,Father, Kidney Disease - Siblings, Stroke - Father, No family history of Heart Disease, Hereditary Spherocytosis, Lung Disease, Seizures, Thyroid Problems, Tuberculosis. Social History Former smoker - ended on 08/14/1990, Marital Status - Married, Alcohol Use - Moderate, Drug Use - No History, Caffeine Use - Daily. Medical History Eyes Denies history of Cataracts, Glaucoma, Optic Neuritis Ear/Nose/Mouth/Throat Denies history of Chronic sinus problems/congestion, Middle ear problems Hematologic/Lymphatic Denies history of Anemia, Hemophilia, Human Immunodeficiency Virus, Lymphedema, Sickle Cell Disease Respiratory Denies history of Aspiration, Asthma, Chronic Obstructive Pulmonary Disease (COPD), Pneumothorax, Sleep Apnea, Tuberculosis Cardiovascular Patient has history of Hypertension Denies history of Angina, Arrhythmia, Congestive Heart Failure, Coronary Artery Disease, Deep Vein Thrombosis, Hypotension, Myocardial Infarction, Peripheral Arterial Disease, Peripheral Venous Disease, Phlebitis, Vasculitis Gastrointestinal Denies history of Cirrhosis , Colitis, Crohnoos, Hepatitis A, Hepatitis B, Hepatitis C Endocrine Patient has history of Type II Diabetes Genitourinary Denies history of End Stage Renal Disease Immunological Denies history of Lupus Erythematosus, Raynaudoos, Scleroderma Integumentary (Skin) Denies history of History of Burn Musculoskeletal Denies history of Gout, Rheumatoid Arthritis, Osteoarthritis, Osteomyelitis Neurologic Denies history of Dementia, Neuropathy, Quadriplegia, Paraplegia, Seizure Disorder Oncologic Denies history of Received Chemotherapy, Received Radiation Psychiatric Denies history of Anorexia/bulimia, Confinement Anxiety Hospitalization/Surgery History - esophagogastroduodenoscopy. - brain aneurysm  surgery. Medical A Surgical History Notes nd Constitutional Symptoms (General Health) obesity Gastrointestinal diverticulitis Neurologic neuropathy Objective Constitutional Hypertensive, asymptomatic. No acute distress.. Vitals  Time Taken: 8:40 AM, Height: 75 in, Weight: 310 lbs, BMI: 38.7, Temperature: 98 F, Pulse: 72 bpm, Respiratory Rate: 18 breaths/min, Blood Pressure: 187/102 mmHg. Respiratory Normal work of breathing on room air.. General Notes: 04/26/2022: The wound is a little bit bigger again today. There is slough accumulation on the surface. Edema control is inadequate. Integumentary (Hair, Skin) Wound #2 status is Open. Original cause of wound was Trauma. The date acquired was: 12/03/2021. The wound has been in treatment 16 weeks. The wound is located on the Right,Lateral Lower Leg. The wound measures 4.8cm length x 2.2cm width x 0.1cm depth; 8.294cm^2 area and 0.829cm^3 volume. There is Fat Layer (Subcutaneous Tissue) exposed. There is no tunneling or undermining noted. There is a medium amount of serosanguineous drainage noted. The wound margin is distinct with the outline attached to the wound base. There is medium (34-66%) red granulation within the wound bed. There is a medium (34-66%) amount of necrotic tissue within the wound bed including Adherent Slough. Assessment Active Problems ICD-10 Non-pressure chronic ulcer of other part of right lower leg with fat layer exposed Type 2 diabetes mellitus with other skin ulcer Type 2 diabetes mellitus without complications Essential (primary) hypertension Venous insufficiency (chronic) (peripheral) Procedures Wound #2 Pre-procedure diagnosis of Wound #2 is a Venous Leg Ulcer located on the Right,Lateral Lower Leg .Severity of Tissue Pre Debridement is: Fat layer exposed. There was a Selective/Open Wound Non-Viable Tissue Debridement with a total area of 8.8 sq cm performed by Anthony Guess, MD. With the  following instrument(s): Curette to remove Non-Viable tissue/material. Material removed includes [facility] after achieving pain control using Lidocaine 4% Topical Solution. No specimens were taken. A time out was conducted at 08:56, prior to the start of the procedure. A Minimum amount of bleeding was controlled with Pressure. The procedure was tolerated well with a pain level of 8 throughout and a pain level of 2 following the procedure. Post Debridement Measurements: 4.8cm length x 2.2cm width x 0.1cm depth; 0.829cm^3 volume. Character of Wound/Ulcer Post Debridement is improved. Severity of Tissue Post Debridement is: Fat layer exposed. Post procedure Diagnosis Wound #2: Same as Pre-Procedure Plan Follow-up Appointments: Return Appointment in 1 week. - Dr. Lady Barnes - room 2 Anesthetic: Wound #2 Right,Lateral Lower Leg: (In clinic) Topical Lidocaine 4% applied to wound bed Bathing/ Shower/ Hygiene: May shower with protection but do not get wound dressing(s) wet. - may purchase a cast protector from [facility] or [facility] Edema Control - Lymphedema / SCD / Other: Elevate legs to the level of the heart or above for 30 minutes daily and/or when sitting, a frequency of: Avoid standing for long periods of time. Patient to wear own compression stockings every day. Exercise regularly Moisturize legs daily. Compression stocking or Garment 20-30 mm/Hg pressure to: - to L left until R leg is healed Laboratory ordered were: Anaerobic culture - PCR of non-healing wound to right lower leg The following medication(s) was prescribed: lidocaine topical 4 % cream cream topical was prescribed at facility WOUND #2: - Lower Leg Wound Laterality: Right, Lateral Cleanser: Soap and Water 1 x Per Week/30 Days Discharge Instructions: May shower and wash wound with dial antibacterial soap and water prior to dressing change. Cleanser: Wound Cleanser 1 x Per Week/30 Days Discharge Instructions: Cleanse the wound with wound  cleanser prior to applying a clean dressing using gauze sponges, not tissue or cotton balls. Peri-Wound Care: Sween Lotion (Moisturizing lotion) 1 x Per Week/30 Days Discharge Instructions: Apply moisturizing lotion as directed Topical:  Gentamicin 1 x Per Week/30 Days Discharge Instructions: As directed by physician Prim Dressing: Hydrofera Blue Ready Foam, 4x5 in 1 x Per Week/30 Days ary Discharge Instructions: Apply to wound bed as instructed Secondary Dressing: Woven Gauze Sponge, Non-Sterile 4x4 in 1 x Per Week/30 Days Discharge Instructions: Apply over primary dressing as directed. Secured With: Coban Self-Adherent Wrap 4x5 (in/yd) 1 x Per Week/30 Days Discharge Instructions: Secure with Coban as directed. Secured With: [employer], 4.5x3.1 (in/yd) 1 x Per Week/30 Days Discharge Instructions: Secure with Kerlix as directed. Secured With: 90M Medipore H Soft Cloth Surgical T ape, 4 x 10 (in/yd) 1 x Per Week/30 Days Discharge Instructions: Secure with tape as directed. 04/26/2022: The wound is a little bit bigger again today. There is slough accumulation on the surface. Edema control is inadequate. I used a curette to debride the slough from the wound. I then took a culture as I am concerned that a contributing factor to his lack of forward progress is potentially infection. We also discussed the importance of compression including his proximal foot and ankle to aid in wound healing. His blood sugars have been marginally controlled and we encouraged him to monitor those more closely. We will use topical gentamicin with Hydrofera Blue and Kerlix and Coban compression. Once his culture data return, we may consider [facility] topical compounded antibiotic and/or an oral systemic treatment. Follow-up in 1 week. Electronic Signature(s) Signed: 04/26/2022 9:16:38 AM By: Anthony Guess MD FACS Entered By: Anthony Barnes on 04/26/2022  09:16:38 -------------------------------------------------------------------------------- HxROS Details Patient Name: Date of Service: Anthony Barnes, Anthony Anthony E. 04/26/2022 8:30 A M Medical Record Number: [id number] Patient Account Number: [id number] Date of Birth/Sex: Treating RN: July 19, 1956 (66 y.o. M) Primary Care Provider: Hoy Barnes Other Clinician: Referring Provider: Treating Provider/Extender: Anthony Barnes Weeks in Treatment: 16 Information Obtained From Patient Constitutional Symptoms (General Health) Medical History: Past Medical History Notes: obesity Eyes Medical History: Negative for: Cataracts; Glaucoma; Optic Neuritis Ear/Nose/Mouth/Throat Medical History: Negative for: Chronic sinus problems/congestion; Middle ear problems Hematologic/Lymphatic Medical History: Negative for: Anemia; Hemophilia; Human Immunodeficiency Virus; Lymphedema; Sickle Cell Disease Respiratory Medical History: Negative for: Aspiration; Asthma; Chronic Obstructive Pulmonary Disease (COPD); Pneumothorax; Sleep Apnea; Tuberculosis Cardiovascular Medical History: Positive for: Hypertension Negative for: Angina; Arrhythmia; Congestive Heart Failure; Coronary Artery Disease; Deep Vein Thrombosis; Hypotension; Myocardial Infarction; Peripheral Arterial Disease; Peripheral Venous Disease; Phlebitis; Vasculitis Gastrointestinal Medical History: Negative for: Cirrhosis ; Colitis; Crohns; Hepatitis A; Hepatitis B; Hepatitis C Past Medical History Notes: diverticulitis Endocrine Medical History: Positive for: Type II Diabetes Time with diabetes: 8 years Treated with: Oral agents Blood sugar tested every day: No Genitourinary Medical History: Negative for: End Stage Renal Disease Immunological Medical History: Negative for: Lupus Erythematosus; Raynauds; Scleroderma Integumentary (Skin) Medical History: Negative for: History of Burn Musculoskeletal Medical  History: Negative for: Gout; Rheumatoid Arthritis; Osteoarthritis; Osteomyelitis Neurologic Medical History: Negative for: Dementia; Neuropathy; Quadriplegia; Paraplegia; Seizure Disorder Past Medical History Notes: neuropathy Oncologic Medical History: Negative for: Received Chemotherapy; Received Radiation Psychiatric Medical History: Negative for: Anorexia/bulimia; Confinement Anxiety Immunizations Pneumococcal Vaccine: Received Pneumococcal Vaccination: No Immunization Notes: tetanus 2 years ago Implantable Devices None Hospitalization / Surgery History Type of Hospitalization/Surgery esophagogastroduodenoscopy brain aneurysm surgery Family and Social History Cancer: Yes - Mother; Diabetes: Yes - Mother,Father; Heart Disease: No; Hereditary Spherocytosis: No; Hypertension: Yes - Mother,Father; Kidney Disease: Yes - Siblings; Lung Disease: No; Seizures: No; Stroke: Yes - Father; Thyroid Problems: No; Tuberculosis: No; Former smoker - ended on 08/14/1990; Marital Status - Married;  Alcohol Use: Moderate; Drug Use: No History; Caffeine Use: Daily; Financial Concerns: No; Food, Clothing or Shelter Needs: No; Support System Lacking: No; Transportation Concerns: No Electronic Signature(s) Signed: 04/26/2022 10:10:05 AM By: Anthony Guess MD FACS Entered By: Anthony Barnes on 04/26/2022 09:14:39 -------------------------------------------------------------------------------- SuperBill Details Patient Name: Date of Service: Anthony Barnes, Anthony Anthony E. 04/26/2022 Medical Record Number: [id number] Patient Account Number: [id number] Date of Birth/Sex: Treating RN: 08/10/1956 (66 y.o. M) Primary Care Provider: Hoy Barnes Other Clinician: Referring Provider: Treating Provider/Extender: Anthony Barnes Weeks in Treatment: 16 Diagnosis Coding ICD-10 Codes Code Description [phone] Non-pressure chronic ulcer of other part of right lower leg with fat layer  exposed E11.622 Type 2 diabetes mellitus with other skin ulcer E11.9 Type 2 diabetes mellitus without complications I10 Essential (primary) hypertension I87.2 Venous insufficiency (chronic) (peripheral) Facility Procedures CPT4 Code: [id number] Description: [phone] - DEBRIDE WOUND 1ST 20 SQ CM OR < ICD-10 Diagnosis Description 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os Modifier: ed Quantity: 1 Physician Procedures : CPT4 Code Description Modifier [id number] - WC PHYS LEVEL 4 - EST PT 25 ICD-10 Diagnosis Description 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osed E11.622 Type 2 diabetes mellitus with other skin ulcer  I87.2 Venous insufficiency (chronic) (peripheral) I10 Essential (primary) hypertension Quantity: 1 : [id number] - WC PHYS DEBR WO ANESTH 20 SQ CM ICD-10 Diagnosis Description 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osed Quantity: 1 Electronic Signature(s) Signed: 04/26/2022 9:16:58 AM By: Anthony Guess MD FACS Entered By: Anthony Barnes on 04/26/2022 09:16:58

## 2022-05-03 ENCOUNTER — Encounter (HOSPITAL_BASED_OUTPATIENT_CLINIC_OR_DEPARTMENT_OTHER): Payer: Medicare Other | Admitting: General Surgery

## 2022-05-03 DIAGNOSIS — E11622 Type 2 diabetes mellitus with other skin ulcer: Secondary | ICD-10-CM | POA: Diagnosis not present

## 2022-05-03 DIAGNOSIS — L97812 Non-pressure chronic ulcer of other part of right lower leg with fat layer exposed: Secondary | ICD-10-CM | POA: Diagnosis not present

## 2022-05-03 DIAGNOSIS — I10 Essential (primary) hypertension: Secondary | ICD-10-CM | POA: Diagnosis not present

## 2022-05-03 DIAGNOSIS — I872 Venous insufficiency (chronic) (peripheral): Secondary | ICD-10-CM | POA: Diagnosis not present

## 2022-05-03 NOTE — Progress Notes (Signed)
Anthony, Barnes ([id number]) Visit Report for 05/03/2022 Arrival Information Details Patient Name: Date of Service: Anthony Barnes 05/03/2022 8:30 A M Medical Record Number: [id number] Patient Account Number: [id number] Date of Birth/Sex: Treating RN: 12-Jan-1956 (66 y.o. Anthony Barnes Primary Care Lakeya Mulka: Charlott Rakes Other Clinician: Referring Dalyce Renne: Treating Rakin Lemelle/Extender: Carter Kitten Weeks in Treatment: 34 Visit Information History Since Last Visit Added or deleted any medications: No Patient Arrived: Ambulatory Any new allergies or adverse reactions: No Arrival Time: 08:43 Had a fall or experienced change in No Accompanied By: self activities of daily living that may affect Transfer Assistance: None risk of falls: Patient Identification Verified: Yes Signs or symptoms of abuse/neglect since last visito No Secondary Verification Process Completed: Yes Hospitalized since last visit: No Patient Requires Transmission-Based Precautions: No Implantable device outside of the clinic excluding No Patient Has Alerts: No cellular tissue based products placed in the center since last visit: Has Dressing in Place as Prescribed: Yes Has Compression in Place as Prescribed: Yes Pain Present Now: No Electronic Signature(s) Signed: 05/03/2022 5:24:32 PM By: Adline Peals Entered By: Adline Peals on 05/03/2022 08:44:27 -------------------------------------------------------------------------------- Encounter Discharge Information Details Patient Name: Date of Service: Anthony Barnes, Anthony [name] 05/03/2022 8:30 A M Medical Record Number: [id number] Patient Account Number: [id number] Date of Birth/Sex: Treating RN: [birth year] (66 y.o. Anthony Barnes Primary Care Jakyia Barnes: Charlott Rakes Other Clinician: Referring Anthony Barnes: Treating Anthony Barnes/Extender: Carter Kitten Weeks in Treatment: 17 Encounter Discharge  Information Items Post Procedure Vitals Discharge Condition: Stable Temperature (F): 98.1 Ambulatory Status: Ambulatory Pulse (bpm): 84 Discharge Destination: Home Respiratory Rate (breaths/min): 18 Transportation: Private Auto Blood Pressure (mmHg): 138/88 Accompanied By: self Schedule Follow-up Appointment: Yes Clinical Summary of Care: Patient Declined Electronic Signature(s) Signed: 05/03/2022 5:24:32 PM By: Adline Peals Entered By: Adline Peals on 05/03/2022 11:41:28 -------------------------------------------------------------------------------- Lower Extremity Assessment Details Patient Name: Date of Service: Anthony Conradi [name] 05/03/2022 8:30 A M Medical Record Number: [id number] Patient Account Number: [id number] Date of Birth/Sex: Treating RN: 04-Apr-1956 (66 y.o. Anthony Barnes Primary Care Zaylan Kissoon: Charlott Rakes Other Clinician: Referring Celestia Duva: Treating Anthony Barnes/Extender: Carter Kitten Weeks in Treatment: 17 Edema Assessment Assessed: [Left: No] [Right: No] E[Left: dema] [Right: :] Calf Left: Right: Point of Measurement: From Medial Instep 44.8 cm Ankle Left: Right: Point of Measurement: From Medial Instep 27 cm Vascular Assessment Pulses: Dorsalis Pedis Palpable: [Right:Yes] Electronic Signature(s) Signed: 05/03/2022 5:24:32 PM By: Adline Peals Entered By: Adline Peals on 05/03/2022 08:47:46 -------------------------------------------------------------------------------- Multi Wound Chart Details Patient Name: Date of Service: Anthony Barnes, Anthony [name] 05/03/2022 8:30 A M Medical Record Number: [id number] Patient Account Number: [id number] Date of Birth/Sex: Treating RN: 1955-09-30 (66 y.o. M) Primary Care Anthony Barnes: Charlott Rakes Other Clinician: Referring Anthony Barnes: Treating Anthony Barnes/Extender: Carter Kitten Weeks in Treatment: 17 Vital Signs Height(in): 75 Pulse(bpm):  84 Weight(lbs): 310 Blood Pressure(mmHg): 138/88 Body Mass Index(BMI): 38.7 Temperature(F): 98.1 Respiratory Rate(breaths/min): 18 Photos: [N/A:N/A] Right, Lateral Lower Leg N/A N/A Wound Location: Trauma N/A N/A Wounding Event: Venous Leg Ulcer N/A N/A Primary Etiology: Hypertension, Type II Diabetes N/A N/A Comorbid History: 12/03/2021 N/A N/A Date Acquired: 41 N/A N/A Weeks of Treatment: Open N/A N/A Wound Status: No N/A N/A Wound Recurrence: 4.2x1.9x0.1 N/A N/A Measurements L x W x D (cm) 6.267 N/A N/A A (cm) : rea 0.627 N/A N/A Volume (cm) : -491.20% N/A N/A % Reduction in A rea: -491.50% N/A N/A % Reduction in Volume: Full Thickness Without Exposed N/A  N/A Classification: Support Structures Medium N/A N/A Exudate A mount: Serosanguineous N/A N/A Exudate Type: red, brown N/A N/A Exudate Color: Distinct, outline attached N/A N/A Wound Margin: Medium (34-66%) N/A N/A Granulation A mount: Red N/A N/A Granulation Quality: Medium (34-66%) N/A N/A Necrotic A mount: Eschar, Adherent Slough N/A N/A Necrotic Tissue: Fat Layer (Subcutaneous Tissue): Yes N/A N/A Exposed Structures: Fascia: No Tendon: No Muscle: No Joint: No Bone: No Small (1-33%) N/A N/A Epithelialization: Debridement - Selective/Open Wound N/A N/A Debridement: Pre-procedure Verification/Time Out 09:01 N/A N/A Taken: Lidocaine 4% Topical Solution N/A N/A Pain Control: Slough N/A N/A Tissue Debrided: Non-Viable Tissue N/A N/A Level: 7.98 N/A N/A Debridement A (sq cm): rea Curette N/A N/A Instrument: Minimum N/A N/A Bleeding: Pressure N/A N/A Hemostasis A chieved: 0 N/A N/A Procedural Pain: 0 N/A N/A Post Procedural Pain: Procedure was tolerated well N/A N/A Debridement Treatment Response: 4.2x1.9x0.1 N/A N/A Post Debridement Measurements L x W x D (cm) 0.627 N/A N/A Post Debridement Volume: (cm) Debridement N/A N/A Procedures Performed: Treatment  Notes Electronic Signature(s) Signed: 05/03/2022 9:22:15 AM By: Anthony Barnes Entered By: Anthony Maudlin on 05/03/2022 09:22:15 -------------------------------------------------------------------------------- Multi-Disciplinary Care Plan Details Patient Name: Date of Service: Anthony Barnes, Anthony [name] 05/03/2022 8:30 A M Medical Record Number: [id number] Patient Account Number: [id number] Date of Birth/Sex: Treating RN: [birth year] (66 y.o. Anthony Barnes Primary Care Bain Whichard: Charlott Rakes Other Clinician: Referring Nazar Kuan: Treating Boen Sterbenz/Extender: Carter Kitten Weeks in Treatment: 17 Active Inactive Venous Leg Ulcer Nursing Diagnoses: Actual venous Insuffiency (use after diagnosis is confirmed) Knowledge deficit related to disease process and management Goals: Patient will maintain optimal edema control Date Initiated: 01/02/2022 Target Resolution Date: 05/12/2022 Goal Status: Active Patient/caregiver will verbalize understanding of disease process and disease management Date Initiated: 01/02/2022 Date Inactivated: 03/07/2022 Target Resolution Date: 03/09/2022 Goal Status: Met Interventions: Assess peripheral edema status every visit. Compression as ordered Provide education on venous insufficiency Treatment Activities: Non-invasive vascular studies : 01/02/2022 T ordered outside of clinic : 01/02/2022 est Therapeutic compression applied : 01/02/2022 Notes: Wound/Skin Impairment Nursing Diagnoses: Impaired tissue integrity Knowledge deficit related to ulceration/compromised skin integrity Goals: Patient/caregiver will verbalize understanding of skin care regimen Date Initiated: 01/02/2022 Date Inactivated: 02/02/2022 Target Resolution Date: 02/03/2022 Goal Status: Met Ulcer/skin breakdown will have a volume reduction of 30% by week 4 Date Initiated: 01/02/2022 Target Resolution Date: 05/12/2022 Goal Status:  Active Interventions: Assess ulceration(s) every visit Provide education on ulcer and skin care Treatment Activities: Skin care regimen initiated : 01/02/2022 Topical wound management initiated : 01/02/2022 Notes: Electronic Signature(s) Signed: 05/03/2022 5:24:32 PM By: Adline Peals Entered By: Adline Peals on 05/03/2022 08:48:37 -------------------------------------------------------------------------------- Pain Assessment Details Patient Name: Date of Service: Anthony Barnes, Anthony [name] 05/03/2022 8:30 A M Medical Record Number: [id number] Patient Account Number: [id number] Date of Birth/Sex: Treating RN: 22-Jun-1956 (67 y.o. Anthony Barnes Primary Care Krithik Mapel: Charlott Rakes Other Clinician: Referring Dawsyn Ramsaran: Treating Sully Dyment/Extender: Carter Kitten Weeks in Treatment: 17 Active Problems Location of Pain Severity and Description of Pain Patient Has Paino No Site Locations Rate the pain. Rate the pain. Current Pain Level: 0 Pain Management and Medication Current Pain Management: Electronic Signature(s) Signed: 05/03/2022 5:24:32 PM By: Adline Peals Entered By: Adline Peals on 05/03/2022 08:44:36 -------------------------------------------------------------------------------- Patient/Caregiver Education Details Patient Name: Date of Service: Anthony Barnes, Anthony RLES Johnette Abraham 9/20/2023andnbsp8:30 A M Medical Record Number: [id number] Patient Account Number: [id number] Date of Birth/Gender: Treating RN: 1955/11/20 (66 y.o. Anthony Barnes Primary Care Physician: Margarita Rana,  Enobong Other Clinician: Referring Physician: Treating Physician/Extender: Harriet Masson in Treatment: 17 Education Assessment Education Provided To: Patient Education Topics Provided Wound/Skin Impairment: Methods: Explain/Verbal Responses: Reinforcements needed, State content correctly Electronic Signature(s) Signed: 05/03/2022  5:24:32 PM By: Adline Peals Entered By: Adline Peals on 05/03/2022 08:48:49 -------------------------------------------------------------------------------- Wound Assessment Details Patient Name: Date of Service: Anthony Barnes, Anthony [name] 05/03/2022 8:30 A M Medical Record Number: [id number] Patient Account Number: [id number] Date of Birth/Sex: Treating RN: 1956/07/20 (66 y.o. Anthony Barnes Primary Care Uziel Covault: Charlott Rakes Other Clinician: Referring Marcos Ruelas: Treating Jilleen Essner/Extender: Carter Kitten Weeks in Treatment: 17 Wound Status Wound Number: 2 Primary Etiology: Venous Leg Ulcer Wound Location: Right, Lateral Lower Leg Wound Status: Open Wounding Event: Trauma Comorbid History: Hypertension, Type II Diabetes Date Acquired: 12/03/2021 Weeks Of Treatment: 17 Clustered Wound: No Photos Wound Measurements Length: (cm) 4.2 Width: (cm) 1.9 Depth: (cm) 0.1 Area: (cm) 6.267 Volume: (cm) 0.627 % Reduction in Area: -491.2% % Reduction in Volume: -491.5% Epithelialization: Small (1-33%) Tunneling: No Undermining: No Wound Description Classification: Full Thickness Without Exposed Support Structures Wound Margin: Distinct, outline attached Exudate Amount: Medium Exudate Type: Serosanguineous Exudate Color: red, brown Foul Odor After Cleansing: No Slough/Fibrino Yes Wound Bed Granulation Amount: Medium (34-66%) Exposed Structure Granulation Quality: Red Fascia Exposed: No Necrotic Amount: Medium (34-66%) Fat Layer (Subcutaneous Tissue) Exposed: Yes Necrotic Quality: Eschar, Adherent Slough Tendon Exposed: No Muscle Exposed: No Joint Exposed: No Bone Exposed: No Treatment Notes Wound #2 (Lower Leg) Wound Laterality: Right, Lateral Cleanser Soap and Water Discharge Instruction: May shower and wash wound with dial antibacterial soap and water prior to dressing change. Wound Cleanser Discharge Instruction: Cleanse the wound  with wound cleanser prior to applying a clean dressing using gauze sponges, not tissue or cotton balls. Peri-Wound Care Sween Lotion (Moisturizing lotion) Discharge Instruction: Apply moisturizing lotion as directed Topical Gentamicin Discharge Instruction: As directed by physician Primary Dressing Hydrofera Blue Ready Foam, 4x5 in Discharge Instruction: Apply to wound bed as instructed Secondary Dressing Woven Gauze Sponge, Non-Sterile 4x4 in Discharge Instruction: Apply over primary dressing as directed. Secured With [employer] 4x5 (in/yd) Discharge Instruction: Secure with Coban as directed. Kerlix Roll Sterile, 4.5x3.1 (in/yd) Discharge Instruction: Secure with Kerlix as directed. 2M Medipore H Soft Cloth Surgical T ape, 4 x 10 (in/yd) Discharge Instruction: Secure with tape as directed. Compression Wrap Compression Stockings Add-Ons Electronic Signature(s) Signed: 05/03/2022 5:24:32 PM By: Adline Peals Entered By: Adline Peals on 05/03/2022 08:53:46 -------------------------------------------------------------------------------- Vitals Details Patient Name: Date of Service: Anthony Barnes, Anthony [name] 05/03/2022 8:30 A M Medical Record Number: [id number] Patient Account Number: [id number] Date of Birth/Sex: Treating RN: 10-18-1955 (66 y.o. Anthony Barnes Primary Care Tarren Velardi: Charlott Rakes Other Clinician: Referring Jaqualin Serpa: Treating Carmel Waddington/Extender: Carter Kitten Weeks in Treatment: 17 Vital Signs Time Taken: 08:43 Temperature (F): 98.1 Height (in): 75 Pulse (bpm): 84 Weight (lbs): 310 Respiratory Rate (breaths/min): 18 Body Mass Index (BMI): 38.7 Blood Pressure (mmHg): 138/88 Reference Range: 80 - 120 mg / dl Electronic Signature(s) Signed: 05/03/2022 5:24:32 PM By: Adline Peals Entered By: Adline Peals on 05/03/2022 08:44:06

## 2022-05-03 NOTE — Progress Notes (Signed)
JAMILL, WETMORE ([id number]) Visit Report for 05/03/2022 Chief Complaint Document Details Patient Name: Date of Service: Anthony Barnes. 05/03/2022 8:30 A M Medical Record Number: [id number] Patient Account Number: [id number] Date of Birth/Sex: Treating RN: February 19, 1956 (66 y.o. M) Primary Care Provider: Hoy Register Other Clinician: Referring Provider: Treating Provider/Extender: Camillo Flaming Weeks in Treatment: 17 Information Obtained from: Patient Chief Complaint Right LE Ulcer Electronic Signature(s) Signed: 05/03/2022 9:22:22 AM By: Duanne Guess MD FACS Entered By: Duanne Guess on 05/03/2022 09:22:21 -------------------------------------------------------------------------------- Debridement Details Patient Name: Date of Service: Anthony Barnes, Anthony Barnes. 05/03/2022 8:30 A M Medical Record Number: [id number] Patient Account Number: [id number] Date of Birth/Sex: Treating RN: 07-24-1956 (66 y.o. [name] Primary Care Provider: Hoy Register Other Clinician: Referring Provider: Treating Provider/Extender: Camillo Flaming Weeks in Treatment: 17 Debridement Performed for Assessment: Wound #2 Right,Lateral Lower Leg Performed By: Physician Duanne Guess, MD Debridement Type: Debridement Severity of Tissue Pre Debridement: Fat layer exposed Level of Consciousness (Pre-procedure): Awake and Alert Pre-procedure Verification/Time Out Yes - 09:01 Taken: Start Time: 09:01 Pain Control: Lidocaine 4% T opical Solution T Area Debrided (L x W): otal 4.2 (cm) x 1.9 (cm) = 7.98 (cm) Tissue and other material debrided: Non-Viable, Slough, Slough Level: Non-Viable Tissue Debridement Description: Selective/Open Wound Instrument: Curette Bleeding: Minimum Hemostasis Achieved: Pressure Procedural Pain: 0 Post Procedural Pain: 0 Response to Treatment: Procedure was tolerated well Level of Consciousness (Post- Awake and  Alert procedure): Post Debridement Measurements of Total Wound Length: (cm) 4.2 Width: (cm) 1.9 Depth: (cm) 0.1 Volume: (cm) 0.627 Character of Wound/Ulcer Post Debridement: Improved Severity of Tissue Post Debridement: Fat layer exposed Post Procedure Diagnosis Same as Pre-procedure Notes scribed for Dr. Lady Gary by Samuella Bruin, RN Electronic Signature(s) Signed: 05/03/2022 12:13:04 PM By: Duanne Guess MD FACS Signed: 05/03/2022 5:24:32 PM By: Samuella Bruin Entered By: Samuella Bruin on 05/03/2022 11:40:43 -------------------------------------------------------------------------------- HPI Details Patient Name: Date of Service: Anthony Barnes, Anthony Barnes. 05/03/2022 8:30 A M Medical Record Number: [id number] Patient Account Number: [id number] Date of Birth/Sex: Treating RN: July 24, 1956 (66 y.o. M) Primary Care Provider: Hoy Register Other Clinician: Referring Provider: Treating Provider/Extender: Camillo Flaming Weeks in Treatment: 17 History of Present Illness HPI Description: ADMISSION 03/12/18 This is a 66 year old and it was a type II diabetic reasonably poorly controlled with a recent hemoglobin A1c of 8.2. He was tending to his lawn on 01/15/18 when his weed [name] sent a rock up word hitting him in the right anterior leg. He was seen in his primary M.D. office is same time. An x-ray showed no abnormality. He was given a course of cephalexin. Since then he's been applying peroxide and topical antibiotics to the wound. He does not have a history of chronic wounds however he does have chronic edema in the lower legs. He is complaining of pain in the right leg wound but no real history of claudication. Past medical history includes type 2 diabetes, hypertension, morbid obesity. ABIs in the right leg were noncompressible 03/19/18 on evaluation today patient appears to be tolerating the wrap in general fairly well. He states he's not having that much pain  in regard to the wound itself. With that being said he is having some issues with slough buildup on the surface of the wound the Iodoflex does seem to be helpful in that regard. He is still having discomfort he wonders if I can send in a prescription for ibuprofen or something of like to help him  out. I do believe that the prox end may be of benefit for him. 03/25/18 on evaluation today patient appears to be doing very well in regard to his right lateral lower [name]. He's been tolerating the dressing changes without complication that is the wraps. Nonetheless he does continue to have pain he states is been dreading the possibility of having to have debridement again today. His first debridement experience was not optimal. With that being said he does seem to be showing signs of improvement there does appear to be little bit more granulation he does have a lot of slough that really does need to breeding away. 04/04/18;the patient went for arterial studies that were really quite normal. ABI on the right at 1.19 with triphasic waveforms on the left 1.11 with triphasic waveforms. TBI 0.93 on the right and 0.95 on the left. This suggests T should be able to tolerate even 4 layer compression. He is however complaining of a lot of pain with our wraps I'm wondering whether the pain is simply Iodoflex. I changed him to [employer] I'm still going to try to keep him in 3 layer compression 04/12/18 on evaluation today patient actually appears to be doing rather well in regard to his right lower [name] ulcer. He is making good progress although it is slow still I do believe that the Santyl is much better than the Iodoflex. The good news is the patient seems to be tolerating the dressing changes without complication now that we've gotten good of the Iodoflex which really did burn him quite significantly. Otherwise there's no evidence of infection. 04/17/18 on evaluation today  patient actually appears to be showing signs of improvement in regard to the ulcer. Unfortunately he's had somewhat of a rough day due to the fact that he found out that one of his friends suddenly and unexpectedly passed today. He states he's not sure he's in the frame of mind to allow me to debride the wound at this point. Nonetheless I do feel like he is showing signs of the wound getting better little by little each week. 04/24/18 on evaluation today patient's ulcer actually appears to be showing some signs of improvement albeit slow. He has been tolerating the dressing changes without complication except for the wrap which she states was so tight that he had to remove it it was causing a lot of discomfort. Fortunately there is no evidence of infection at this point. He states he only wore the wrap until about the time he got home last week and that he had to take it off. Nonetheless he does not have any other compression stockings, Juxta-Lite wraps, or anything otherwise to really help at this point as far as compression is concerned. 05/01/18 on evaluation today patient actually appears to be doing fairly well in regard to his lower extremity ulcer. He has been tolerating the dressing changes currently without complication. The wrap does seem to be helping as far as the fluid management is concerned. Wound bed itself actually show signs of good improvement although there is some Slough noted there's not as much as previous and he actually has fairly decent granulation noted as well. Overall I'm pleased with the progress of to this point. The patient would prefer not to have any debridement today he states he's actually not feeling too well in general and he really does not want any additional discomfort typically debridement is fairly uncomfortable for him. 05/08/18 on evaluation today patient actually  appears to be doing a little better in regard to his lower extremity ulcer. He has been tolerating  the dressing changes without complication. With that being said I'm actually quite pleased with the fact the wound is not appear to be infected and in general has made a little progress. With that being said I do believe we need to perform some debridement to clean away the necrotic tissue on the surface of the wound. 05/15/18 on evaluation today patient actually appears to be doing a little better in regard to his wound. Fortunately there is some improvement noted fortunately there's also no significant evidence of infection at this point in time. He does have continued pain that really nothing different than previous he did get his Juxta-Lite wrap. 05/29/18 on evaluation today patient actually appears to be doing somewhat better in regard to his wound currently. He's been tolerating the dressing changes without complication. With that being said he has been performing the [facility] Dressing changes every day at home. I'm pretty sure that we told him every other day last week or when I saw him rather two weeks ago. With that being said obviously did not hurt to do it daily just that obviously is gonna run out of supplies sooner and that could get him into trouble as far as his insurance is concerned. Nonetheless he at this point still continues to have pain although honestly I don't feel like it's as bad as what it was in the past just based on his reactions at this point. 06/12/18 on evaluation today patient actually appears to be doing better in regard to his lower [name] ulcer. He's been tolerating the dressing changes without complication. Fortunately there does not appear to be any evidence of infection at this time. No fevers, chills, nausea, or vomiting noted at this time. 06/19/18 evaluation today patient's ulcer on the lower extremity appears to be doing better at this point. he has been tolerating the dressing changes. The patient seems to be somewhat depressed about the progress of his  wound although the overall appearance at this time seems to be doing well. In general I'm very pleased with the appearance today he does have some biofilm on the surface of the wound as well as of hyper granular tissue we may want to utilize a little bit of silver nitrate today. 06/27/2018; patient comes into clinic today for a wound on the right lateral calf likely related to chronic venous insufficiency. He has been using medihoney covered with Hydrofera Blue. Wound surface actually looks quite good 07/03/2018 seen today for follow-up and management of lower extremity ulcer right lateral shin. T olerating dressing changes. He has a small amount of biofilm today. Will attempt to remove a portion of the biofilm as he tolerates. He becomes very anxious with wound treatments. He would benefit from layer compression wraps however he refuses compression wraps or anything that smokes his legs. He expressed an inability to tolerate compression wraps. Juxta lite wraps have been ordered. He has been instructed on the appropriate application of the juxta leg wraps. His blood pressure today on visit was 200/120. He denies any blurred vision, chest pain, dizziness, shortness of breath, or difficulty with mobility. Mr. Faith stated that he had a recent visit with his primary care provider. At that time his carvedilol was decreased from 25 mg daily to 12.5 mg daily. Strongly encouraged Mr. Hellmer to seek medical attention today during visit. He declined transport for evaluation of elevated blood pressure.  Encouraged him to contact his primary care provider today for medication management. He stated that he would call his primary care doctor after wound visit today. Also encouraged him that if he experienced any symptoms of blurred vision, chest pain, shortness of breath to immediately seek medical attention. 07/17/18 on evaluation today patient's wound actually does seem to show signs of improvement based on the  overall appearance of the wound bed today. There does not appear to show any signs of infection which is good news. There is no overall worsening which is also good news. He still has hyper granular tissue will use in the [employer] followed by [facility] Dressing this point. 07/31/18 on evaluation today patient's wound bed actually show signs of improvement with good epithelialization especially in the upper portion of the wound. He has been tolerating the dressing changes without complication in general. Overall I'm extremely happy with how things stand. No fevers, chills, nausea, or vomiting noted at this time. 08/28/18 on evaluation today patient appears to be doing a little better in regard to his lower extremity ulcer. With that being said it appears to be very hyper granular I think this is directly attributed to the fact that he continues to use the [employer] underneath the [facility] Dressing. Although this helps not to stick unfortunately also think he's not getting the benefit of the [facility] Dressing particular and subsequently is causing too much moisture buildup hence the hyper granulation. Fortunately there does not appear to be any signs of infection at this time which is good news. 09/03/18 on evaluation today patient appears to be doing well in regard to his lower [name] ulcer. He has been tolerating the dressing changes without complication. Fortunately he has much less hyper granular tissue the noted last week I do think using silver nitrate in changing to using the [facility] Dressing without the mepitel has made a big difference for him this is good news. 09/18/18 on evaluation today patient appears to be doing excellent in regard to his right lower [name] ulcer. This is actually significantly smaller even compared to the last evaluation. Overall I'm very pleased in this regard. I'm a recommend that we likely repeat the silver nitrate today based on what I'm  seeing. 10/02/18 on evaluation today patient appears to be doing excellent in regard to his lower extremity wound on the right. He's been tolerating the dressing changes without complication. Fortunately there's no signs of infection at this time. Overall been very pleased with how things seem to be progressing. No fevers, chills, nausea, or vomiting noted at this time. 10/16/18 on evaluation today patient actually appears to be doing much better in regard to his lower extremity wound on the right. This is shown signs of good improvement and is indeed measuring smaller he is on a excellent track as far as healing is concerned. My hope is this will be healed of the next several weeks. Fortunately there is no evidence of infection at this time. He does seem to be doing everything that I'm recommending for him 10/30/18 on evaluation today patient appears to be doing better in regard to his lower extremity ulcer. He's been tolerating the dressing changes without complication. Fortunately the Hydrofera Blue Dressing to be doing the job. He has made excellent progress. With that being said this is still going somewhat slow but nonetheless is always making good progress at this point. 5/18-Patient was in to be seen for right leg ulcer that appeared after scab above  it was removed today. This area had healed completely. 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 The wound has not healed though it has been nearly 2 months since the injury occurred. He was seen in the emergency department at [facility] on May 9. At that time it was very painful and he described it as a "15 on a scale of 010". He also was found to have 3+ pitting edema to the  bilateral lower extremities. He was prescribed a weeks course of Keflex. He is here today because the wound has failed to heal and he has a prior history in our clinic. ABI in clinic today was noncompressible. He did have formal vascular studies performed in 2019 which were normal. The last hemoglobin A1c I have available for review was from March 21, 2021 and was elevated at 7.7%. The wound is fairly small and circular located about 3 inches above his right lateral malleolus. It is completely covered with eschar. No surrounding erythema, no odor, no purulent drainage. 01/11/2022: The wound on his right lateral leg is a little bit smaller today. It has reaccumulated some slough and a small bit of eschar. It remains fairly painful. He has another area on his anterior tibia that he will not let me touch but I am concerned that there is a wound forming underneath the surface. 01/18/2022: Unfortunately, his wound is a little bit bigger today. He continues to accumulate slough on the wound surface. It is still quite tender. 01/25/2022: The patient bumped his wound on the car door which resulted in bleeding. He was seen at urgent care where it was redressed. Unfortunately, the wound is bigger again today. There is accumulated slough on the surface. Urgent care gave him some tramadol, apparently and he took this prior to his visit so he could tolerate debridement better. 02/02/2022: The wound is a little bit smaller today. The surface, however, has accumulated a fairly thick layer of slough. The periwound skin is intact and there is no obvious sign of infection. 02/07/2022: The wound is a bit larger today. It has thick slough accumulation. The periwound skin is intact and there is no obvious erythema, induration, nor any odor. 02/16/2022: I took a culture last week due to the appearance of the wound and the patient's degree of pain. This grew out methicillin sensitive Staph aureus. Augmentin was prescribed. The  patient states that the pain is markedly improved today. The wound also looks much better. It is smaller with less slough buildup and there is good granulation tissue emerging. 02/21/2022: The patient's pain is quite improved from prior visits. The wound is about the same size and has some accumulated slough on the surface. The base is a little bit fibrotic but there are some areas of granulation tissue filling in. 02/28/2022: The wound is a little bit narrower on its measurements but slightly longer. There is still some slough accumulation on the surface, but he has minimal pain and there is no concern for ongoing infection. Granulation tissue is emerging. 03/07/2022: During the week, he cut holes in his wrap because it was painful and too tight. As a result, his leg was quite a bit more swollen today and his wound was larger.  The wound surface is fairly clean but a little bit dry. Minimal slough accumulation. Granulation tissue present. 03/14/2022: Once again, he did some arts and crafts projects with his wrap. On the other hand, however, his wound does look better. It is smaller today and is beginning to fill in. Minimal slough and a small amount of eschar accumulation. 03/27/2022: His wound measured bigger today, although it does not appear to be so on my impression. The wound continues to fill with granulation tissue. There is a little bit of slough and eschar accumulation. He continues to cut his wrap off of his feet and his edema control at the ankle is particularly poor with 3+ pitting edema. His insurance will not cover any sort of skin substitute unfortunately. 04/04/2022: The wound is smaller today. It is more superficial with good granulation tissue and just a little bit of slough. He continues to cut away portions of his wrap which results in inadequate edema control. 04/11/2022: His wound measured a couple millimeters larger today. It continues to fill with granulation tissue and there is  minimal slough on the surface. 04/19/2022: No significant change in the wound dimensions, but the surface is healthier and less fibrotic. 04/26/2022: The wound is a little bit bigger again today. There is slough accumulation on the surface. Edema control is inadequate. He has been cutting the foot portion of his wrap off each week. 05/03/2022: The wound is a little smaller this week. There is still a fair amount of slough on the surface. His drainage was a little bit as greenish today, although not the blue-green typically associated with Pseudomonas. No significant odor. The underlying granulation tissue appears healthy. The culture that I took last week grew out methicillin sensitive Staph aureus. Electronic Signature(s) Signed: 05/03/2022 9:23:18 AM By: Duanne Guess MD FACS Entered By: Duanne Guess on 05/03/2022 09:23:17 -------------------------------------------------------------------------------- Physical Exam Details Patient Name: Date of Service: Anthony Barnes, Anthony Barnes. 05/03/2022 8:30 A M Medical Record Number: [id number] Patient Account Number: [id number] Date of Birth/Sex: Treating RN: 1955/11/30 (66 y.o. M) Primary Care Provider: Hoy Register Other Clinician: Referring Provider: Treating Provider/Extender: Whitney Muse, Enobong Weeks in Treatment: 17 Constitutional . . . . No acute distress.Marland Kitchen Respiratory Normal work of breathing on room air.. Notes 05/03/2022: The wound is a little smaller this week. There is still a fair amount of slough on the surface. His drainage was a little bit as greenish today, although not the blue-green typically associated with Pseudomonas. No significant odor. The underlying granulation tissue appears healthy. Electronic Signature(s) Signed: 05/03/2022 9:23:55 AM By: Duanne Guess MD FACS Entered By: Duanne Guess on 05/03/2022 09:23:55 -------------------------------------------------------------------------------- Physician  Orders Details Patient Name: Date of Service: Anthony Barnes, Anthony Barnes. 05/03/2022 8:30 A M Medical Record Number: [id number] Patient Account Number: [id number] Date of Birth/Sex: Treating RN: 03-20-1956 (66 y.o. [name] Primary Care Provider: Other Clinician: Hoy Register Referring Provider: Treating Provider/Extender: Camillo Flaming Weeks in Treatment: 73 Verbal / Phone Orders: No Diagnosis Coding ICD-10 Coding Code Description [phone] Non-pressure chronic ulcer of other part of right lower leg with fat layer exposed E11.622 Type 2 diabetes mellitus with other skin ulcer E11.9 Type 2 diabetes mellitus without complications I10 Essential (primary) hypertension I87.2 Venous insufficiency (chronic) (peripheral) Follow-up Appointments ppointment in 1 week. - Dr. Lady Gary - room 2 Return A Anesthetic Wound #2 Right,Lateral Lower Leg (In clinic) Topical Lidocaine 4% applied to wound bed Bathing/ Shower/ Hygiene May shower with protection but do not get  wound dressing(s) wet. - may purchase a cast protector from [facility] or [facility] Edema Control - Lymphedema / SCD / Other Elevate legs to the level of the heart or above for 30 minutes daily and/or when sitting, a frequency of: Avoid standing for long periods of time. Patient to wear own compression stockings every day. Exercise regularly Moisturize legs daily. Compression stocking or Garment 20-30 mm/Hg pressure to: - to L left until R leg is healed Wound Treatment Wound #2 - Lower Leg Wound Laterality: Right, Lateral Cleanser: Soap and Water 1 x Per Week/30 Days Discharge Instructions: May shower and wash wound with dial antibacterial soap and water prior to dressing change. Cleanser: Wound Cleanser 1 x Per Week/30 Days Discharge Instructions: Cleanse the wound with wound cleanser prior to applying a clean dressing using gauze sponges, not tissue or cotton balls. Peri-Wound Care: Sween Lotion  (Moisturizing lotion) 1 x Per Week/30 Days Discharge Instructions: Apply moisturizing lotion as directed Topical: Gentamicin 1 x Per Week/30 Days Discharge Instructions: As directed by physician Prim Dressing: Hydrofera Blue Ready Foam, 4x5 in 1 x Per Week/30 Days ary Discharge Instructions: Apply to wound bed as instructed Secondary Dressing: Woven Gauze Sponge, Non-Sterile 4x4 in 1 x Per Week/30 Days Discharge Instructions: Apply over primary dressing as directed. Secured With: Coban Self-Adherent Wrap 4x5 (in/yd) 1 x Per Week/30 Days Discharge Instructions: Secure with Coban as directed. Secured With: [employer], 4.5x3.1 (in/yd) 1 x Per Week/30 Days Discharge Instructions: Secure with Kerlix as directed. Secured With: 77M Medipore H Soft Cloth Surgical T ape, 4 x 10 (in/yd) 1 x Per Week/30 Days Discharge Instructions: Secure with tape as directed. Patient Medications llergies: Shellfish Containing Products, codeine A Notifications Medication Indication Start End 05/03/2022 lidocaine DOSE topical 4 % cream - cream topical 05/03/2022 amoxicillin-pot clavulanate DOSE oral 875 mg-125 mg tablet - 1 tab p.o. twice daily x7 days Electronic Signature(s) Signed: 05/03/2022 9:24:53 AM By: Duanne Guess MD FACS Entered By: Duanne Guess on 05/03/2022 09:24:52 -------------------------------------------------------------------------------- Problem List Details Patient Name: Date of Service: Anthony Barnes, Anthony Barnes. 05/03/2022 8:30 A M Medical Record Number: [id number] Patient Account Number: [id number] Date of Birth/Sex: Treating RN: 1955/12/04 (66 y.o. M) Primary Care Provider: Hoy Register Other Clinician: Referring Provider: Treating Provider/Extender: Camillo Flaming Weeks in Treatment: 17 Active Problems ICD-10 Encounter Code Description Active Date MDM Diagnosis L97.812 Non-pressure chronic ulcer of other part of right lower leg with fat layer  01/02/2022 No Yes exposed E11.622 Type 2 diabetes mellitus with other skin ulcer 01/02/2022 No Yes E11.9 Type 2 diabetes mellitus without complications 01/02/2022 No Yes I10 Essential (primary) hypertension 01/02/2022 No Yes I87.2 Venous insufficiency (chronic) (peripheral) 01/02/2022 No Yes Inactive Problems Resolved Problems Electronic Signature(s) Signed: 05/03/2022 9:20:28 AM By: Duanne Guess MD FACS Entered By: Duanne Guess on 05/03/2022 09:20:28 -------------------------------------------------------------------------------- Progress Note Details Patient Name: Date of Service: Anthony Barnes, Anthony Barnes. 05/03/2022 8:30 A M Medical Record Number: [id number] Patient Account Number: [id number] Date of Birth/Sex: Treating RN: [birth year] (66 y.o. M) Primary Care Provider: Hoy Register Other Clinician: Referring Provider: Treating Provider/Extender: Camillo Flaming Weeks in Treatment: 17 Subjective Chief Complaint Information obtained from Patient Right LE Ulcer History of Present Illness (HPI) ADMISSION 03/12/18 This is a 66 year old and it was a type II diabetic reasonably poorly controlled with a recent hemoglobin A1c of 8.2. He was tending to his lawn on 01/15/18 when his weed [name] sent a rock up word hitting him in the right anterior leg.  He was seen in his primary M.D. office is same time. An x-ray showed no abnormality. He was given a course of cephalexin. Since then he's been applying peroxide and topical antibiotics to the wound. He does not have a history of chronic wounds however he does have chronic edema in the lower legs. He is complaining of pain in the right leg wound but no real history of claudication. Past medical history includes type 2 diabetes, hypertension, morbid obesity. ABIs in the right leg were noncompressible 03/19/18 on evaluation today patient appears to be tolerating the wrap in general fairly well. He states he's not having that much  pain in regard to the wound itself. With that being said he is having some issues with slough buildup on the surface of the wound the Iodoflex does seem to be helpful in that regard. He is still having discomfort he wonders if I can send in a prescription for ibuprofen or something of like to help him out. I do believe that the prox end may be of benefit for him. 03/25/18 on evaluation today patient appears to be doing very well in regard to his right lateral lower [name]. He's been tolerating the dressing changes without complication that is the wraps. Nonetheless he does continue to have pain he states is been dreading the possibility of having to have debridement again today. His first debridement experience was not optimal. With that being said he does seem to be showing signs of improvement there does appear to be little bit more granulation he does have a lot of slough that really does need to breeding away. 04/04/18;the patient went for arterial studies that were really quite normal. ABI on the right at 1.19 with triphasic waveforms on the left 1.11 with triphasic waveforms. TBI 0.93 on the right and 0.95 on the left. This suggests T should be able to tolerate even 4 layer compression. He is however complaining of a lot of pain with our wraps I'm wondering whether the pain is simply Iodoflex. I changed him to [employer] I'm still going to try to keep him in 3 layer compression 04/12/18 on evaluation today patient actually appears to be doing rather well in regard to his right lower [name] ulcer. He is making good progress although it is slow still I do believe that the Santyl is much better than the Iodoflex. The good news is the patient seems to be tolerating the dressing changes without complication now that we've gotten good of the Iodoflex which really did burn him quite significantly. Otherwise there's no evidence of infection. 04/17/18 on evaluation  today patient actually appears to be showing signs of improvement in regard to the ulcer. Unfortunately he's had somewhat of a rough day due to the fact that he found out that one of his friends suddenly and unexpectedly passed today. He states he's not sure he's in the frame of mind to allow me to debride the wound at this point. Nonetheless I do feel like he is showing signs of the wound getting better little by little each week. 04/24/18 on evaluation today patient's ulcer actually appears to be showing some signs of improvement albeit slow. He has been tolerating the dressing changes without complication except for the wrap which she states was so tight that he had to remove it it was causing a lot of discomfort. Fortunately there is no evidence of infection at this point. He states he only wore the wrap until about  the time he got home last week and that he had to take it off. Nonetheless he does not have any other compression stockings, Juxta-Lite wraps, or anything otherwise to really help at this point as far as compression is concerned. 05/01/18 on evaluation today patient actually appears to be doing fairly well in regard to his lower extremity ulcer. He has been tolerating the dressing changes currently without complication. The wrap does seem to be helping as far as the fluid management is concerned. Wound bed itself actually show signs of good improvement although there is some Slough noted there's not as much as previous and he actually has fairly decent granulation noted as well. Overall I'm pleased with the progress of to this point. The patient would prefer not to have any debridement today he states he's actually not feeling too well in general and he really does not want any additional discomfort typically debridement is fairly uncomfortable for him. 05/08/18 on evaluation today patient actually appears to be doing a little better in regard to his lower extremity ulcer. He has been  tolerating the dressing changes without complication. With that being said I'm actually quite pleased with the fact the wound is not appear to be infected and in general has made a little progress. With that being said I do believe we need to perform some debridement to clean away the necrotic tissue on the surface of the wound. 05/15/18 on evaluation today patient actually appears to be doing a little better in regard to his wound. Fortunately there is some improvement noted fortunately there's also no significant evidence of infection at this point in time. He does have continued pain that really nothing different than previous he did get his Juxta-Lite wrap. 05/29/18 on evaluation today patient actually appears to be doing somewhat better in regard to his wound currently. He's been tolerating the dressing changes without complication. With that being said he has been performing the [facility] Dressing changes every day at home. I'm pretty sure that we told him every other day last week or when I saw him rather two weeks ago. With that being said obviously did not hurt to do it daily just that obviously is gonna run out of supplies sooner and that could get him into trouble as far as his insurance is concerned. Nonetheless he at this point still continues to have pain although honestly I don't feel like it's as bad as what it was in the past just based on his reactions at this point. 06/12/18 on evaluation today patient actually appears to be doing better in regard to his lower [name] ulcer. He's been tolerating the dressing changes without complication. Fortunately there does not appear to be any evidence of infection at this time. No fevers, chills, nausea, or vomiting noted at this time. 06/19/18 evaluation today patient's ulcer on the lower extremity appears to be doing better at this point. he has been tolerating the dressing changes. The patient seems to be somewhat depressed about the  progress of his wound although the overall appearance at this time seems to be doing well. In general I'm very pleased with the appearance today he does have some biofilm on the surface of the wound as well as of hyper granular tissue we may want to utilize a little bit of silver nitrate today. 06/27/2018; patient comes into clinic today for a wound on the right lateral calf likely related to chronic venous insufficiency. He has been using medihoney covered with Hydrofera Blue.  Wound surface actually looks quite good 07/03/2018 seen today for follow-up and management of lower extremity ulcer right lateral shin. T olerating dressing changes. He has a small amount of biofilm today. Will attempt to remove a portion of the biofilm as he tolerates. He becomes very anxious with wound treatments. He would benefit from layer compression wraps however he refuses compression wraps or anything that smokes his legs. He expressed an inability to tolerate compression wraps. Juxta lite wraps have been ordered. He has been instructed on the appropriate application of the juxta leg wraps. His blood pressure today on visit was 200/120. He denies any blurred vision, chest pain, dizziness, shortness of breath, or difficulty with mobility. Anthony Barnes stated that he had a recent visit with his primary care provider. At that time his carvedilol was decreased from 25 mg daily to 12.5 mg daily. Strongly encouraged Anthony Barnes to seek medical attention today during visit. He declined transport for evaluation of elevated blood pressure. Encouraged him to contact his primary care provider today for medication management. He stated that he would call his primary care doctor after wound visit today. Also encouraged him that if he experienced any symptoms of blurred vision, chest pain, shortness of breath to immediately seek medical attention. 07/17/18 on evaluation today patient's wound actually does seem to show signs of  improvement based on the overall appearance of the wound bed today. There does not appear to show any signs of infection which is good news. There is no overall worsening which is also good news. He still has hyper granular tissue will use in the [employer] followed by [facility] Dressing this point. 07/31/18 on evaluation today patient's wound bed actually show signs of improvement with good epithelialization especially in the upper portion of the wound. He has been tolerating the dressing changes without complication in general. Overall I'm extremely happy with how things stand. No fevers, chills, nausea, or vomiting noted at this time. 08/28/18 on evaluation today patient appears to be doing a little better in regard to his lower extremity ulcer. With that being said it appears to be very hyper granular I think this is directly attributed to the fact that he continues to use the [employer] underneath the [facility] Dressing. Although this helps not to stick unfortunately also think he's not getting the benefit of the [facility] Dressing particular and subsequently is causing too much moisture buildup hence the hyper granulation. Fortunately there does not appear to be any signs of infection at this time which is good news. 09/03/18 on evaluation today patient appears to be doing well in regard to his lower [name] ulcer. He has been tolerating the dressing changes without complication. Fortunately he has much less hyper granular tissue the noted last week I do think using silver nitrate in changing to using the [facility] Dressing without the mepitel has made a big difference for him this is good news. 09/18/18 on evaluation today patient appears to be doing excellent in regard to his right lower [name] ulcer. This is actually significantly smaller even compared to the last evaluation. Overall I'm very pleased in this regard. I'm a recommend that we likely repeat the silver nitrate today  based on what I'm seeing. 10/02/18 on evaluation today patient appears to be doing excellent in regard to his lower extremity wound on the right. He's been tolerating the dressing changes without complication. Fortunately there's no signs of infection at this time. Overall been very pleased with how things seem  to be progressing. No fevers, chills, nausea, or vomiting noted at this time. 10/16/18 on evaluation today patient actually appears to be doing much better in regard to his lower extremity wound on the right. This is shown signs of good improvement and is indeed measuring smaller he is on a excellent track as far as healing is concerned. My hope is this will be healed of the next several weeks. Fortunately there is no evidence of infection at this time. He does seem to be doing everything that I'm recommending for him 10/30/18 on evaluation today patient appears to be doing better in regard to his lower extremity ulcer. He's been tolerating the dressing changes without complication. Fortunately the Hydrofera Blue Dressing to be doing the job. He has made excellent progress. With that being said this is still going somewhat slow but nonetheless is always making good progress at this point. 5/18-Patient was in to be seen for right leg ulcer that appeared after scab above it was removed today. This area had healed completely. 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 The wound has not healed though it has been nearly 2 months since the injury occurred. He was seen in the emergency department at [facility] on May 9. At that time it was very painful and he described it as a "15 on a scale of 0oo10". He also was found to have 3+  pitting edema to the bilateral lower extremities. He was prescribed a weeks course of Keflex. He is here today because the wound has failed to heal and he has a prior history in our clinic. ABI in clinic today was noncompressible. He did have formal vascular studies performed in 2019 which were normal. The last hemoglobin A1c I have available for review was from March 21, 2021 and was elevated at 7.7%. The wound is fairly small and circular located about 3 inches above his right lateral malleolus. It is completely covered with eschar. No surrounding erythema, no odor, no purulent drainage. 01/11/2022: The wound on his right lateral leg is a little bit smaller today. It has reaccumulated some slough and a small bit of eschar. It remains fairly painful. He has another area on his anterior tibia that he will not let me touch but I am concerned that there is a wound forming underneath the surface. 01/18/2022: Unfortunately, his wound is a little bit bigger today. He continues to accumulate slough on the wound surface. It is still quite tender. 01/25/2022: The patient bumped his wound on the car door which resulted in bleeding. He was seen at urgent care where it was redressed. Unfortunately, the wound is bigger again today. There is accumulated slough on the surface. Urgent care gave him some tramadol, apparently and he took this prior to his visit so he could tolerate debridement better. 02/02/2022: The wound is a little bit smaller today. The surface, however, has accumulated a fairly thick layer of slough. The periwound skin is intact and there is no obvious sign of infection. 02/07/2022: The wound is a bit larger today. It has thick slough accumulation. The periwound skin is intact and there is no obvious erythema, induration, nor any odor. 02/16/2022: I took a culture last week due to  the appearance of the wound and the patient's degree of pain. This grew out methicillin sensitive Staph aureus. Augmentin  was prescribed. The patient states that the pain is markedly improved today. The wound also looks much better. It is smaller with less slough buildup and there is good granulation tissue emerging. 02/21/2022: The patient's pain is quite improved from prior visits. The wound is about the same size and has some accumulated slough on the surface. The base is a little bit fibrotic but there are some areas of granulation tissue filling in. 02/28/2022: The wound is a little bit narrower on its measurements but slightly longer. There is still some slough accumulation on the surface, but he has minimal pain and there is no concern for ongoing infection. Granulation tissue is emerging. 03/07/2022: During the week, he cut holes in his wrap because it was painful and too tight. As a result, his leg was quite a bit more swollen today and his wound was larger. The wound surface is fairly clean but a little bit dry. Minimal slough accumulation. Granulation tissue present. 03/14/2022: Once again, he did some arts and crafts projects with his wrap. On the other hand, however, his wound does look better. It is smaller today and is beginning to fill in. Minimal slough and a small amount of eschar accumulation. 03/27/2022: His wound measured bigger today, although it does not appear to be so on my impression. The wound continues to fill with granulation tissue. There is a little bit of slough and eschar accumulation. He continues to cut his wrap off of his feet and his edema control at the ankle is particularly poor with 3+ pitting edema. His insurance will not cover any sort of skin substitute unfortunately. 04/04/2022: The wound is smaller today. It is more superficial with good granulation tissue and just a little bit of slough. He continues to cut away portions of his wrap which results in inadequate edema control. 04/11/2022: His wound measured a couple millimeters larger today. It continues to fill with granulation tissue  and there is minimal slough on the surface. 04/19/2022: No significant change in the wound dimensions, but the surface is healthier and less fibrotic. 04/26/2022: The wound is a little bit bigger again today. There is slough accumulation on the surface. Edema control is inadequate. He has been cutting the foot portion of his wrap off each week. 05/03/2022: The wound is a little smaller this week. There is still a fair amount of slough on the surface. His drainage was a little bit as greenish today, although not the blue-green typically associated with Pseudomonas. No significant odor. The underlying granulation tissue appears healthy. The culture that I took last week grew out methicillin sensitive Staph aureus. Patient History Information obtained from Patient. Family History Cancer - Mother, Diabetes - Mother,Father, Hypertension - Mother,Father, Kidney Disease - Siblings, Stroke - Father, No family history of Heart Disease, Hereditary Spherocytosis, Lung Disease, Seizures, Thyroid Problems, Tuberculosis. Social History Former smoker - ended on 08/14/1990, Marital Status - Married, Alcohol Use - Moderate, Drug Use - No History, Caffeine Use - Daily. Medical History Eyes Denies history of Cataracts, Glaucoma, Optic Neuritis Ear/Nose/Mouth/Throat Denies history of Chronic sinus problems/congestion, Middle ear problems Hematologic/Lymphatic Denies history of Anemia, Hemophilia, Human Immunodeficiency Virus, Lymphedema, Sickle Cell Disease Respiratory Denies history of Aspiration, Asthma, Chronic Obstructive Pulmonary Disease (COPD), Pneumothorax, Sleep Apnea, Tuberculosis Cardiovascular Patient has history of Hypertension Denies history of Angina, Arrhythmia, Congestive Heart Failure, Coronary Artery Disease, Deep Vein Thrombosis, Hypotension, Myocardial  Infarction, Peripheral Arterial Disease, Peripheral Venous Disease, Phlebitis, Vasculitis Gastrointestinal Denies history of Cirrhosis ,  Colitis, Crohnoos, Hepatitis A, Hepatitis B, Hepatitis C Endocrine Patient has history of Type II Diabetes Genitourinary Denies history of End Stage Renal Disease Immunological Denies history of Lupus Erythematosus, Raynaudoos, Scleroderma Integumentary (Skin) Denies history of History of Burn Musculoskeletal Denies history of Gout, Rheumatoid Arthritis, Osteoarthritis, Osteomyelitis Neurologic Denies history of Dementia, Neuropathy, Quadriplegia, Paraplegia, Seizure Disorder Oncologic Denies history of Received Chemotherapy, Received Radiation Psychiatric Denies history of Anorexia/bulimia, Confinement Anxiety Hospitalization/Surgery History - esophagogastroduodenoscopy. - brain aneurysm surgery. Medical A Surgical History Notes nd Constitutional Symptoms (General Health) obesity Gastrointestinal diverticulitis Neurologic neuropathy Objective Constitutional No acute distress.. Vitals Time Taken: 8:43 AM, Height: 75 in, Weight: 310 lbs, BMI: 38.7, Temperature: 98.1 F, Pulse: 84 bpm, Respiratory Rate: 18 breaths/min, Blood Pressure: 138/88 mmHg. Respiratory Normal work of breathing on room air.. General Notes: 05/03/2022: The wound is a little smaller this week. There is still a fair amount of slough on the surface. His drainage was a little bit as greenish today, although not the blue-green typically associated with Pseudomonas. No significant odor. The underlying granulation tissue appears healthy. Integumentary (Hair, Skin) Wound #2 status is Open. Original cause of wound was Trauma. The date acquired was: 12/03/2021. The wound has been in treatment 17 weeks. The wound is located on the Right,Lateral Lower Leg. The wound measures 4.2cm length x 1.9cm width x 0.1cm depth; 6.267cm^2 area and 0.627cm^3 volume. There is Fat Layer (Subcutaneous Tissue) exposed. There is no tunneling or undermining noted. There is a medium amount of serosanguineous drainage noted. The  wound margin is distinct with the outline attached to the wound base. There is medium (34-66%) red granulation within the wound bed. There is a medium (34-66%) amount of necrotic tissue within the wound bed including Eschar and Adherent Slough. Assessment Active Problems ICD-10 Non-pressure chronic ulcer of other part of right lower leg with fat layer exposed Type 2 diabetes mellitus with other skin ulcer Type 2 diabetes mellitus without complications Essential (primary) hypertension Venous insufficiency (chronic) (peripheral) Procedures Wound #2 Pre-procedure diagnosis of Wound #2 is a Venous Leg Ulcer located on the Right,Lateral Lower Leg .Severity of Tissue Pre Debridement is: Fat layer exposed. There was a Selective/Open Wound Non-Viable Tissue Debridement with a total area of 7.98 sq cm performed by Duanne Guessannon, Eldridge Marcott, MD. With the following instrument(s): Curette to remove Non-Viable tissue/material. Material removed includes [facility] after achieving pain control using Lidocaine 4% Topical Solution. No specimens were taken. A time out was conducted at 09:01, prior to the start of the procedure. A Minimum amount of bleeding was controlled with Pressure. The procedure was tolerated well with a pain level of 0 throughout and a pain level of 0 following the procedure. Post Debridement Measurements: 4.2cm length x 1.9cm width x 0.1cm depth; 0.627cm^3 volume. Character of Wound/Ulcer Post Debridement is improved. Severity of Tissue Post Debridement is: Fat layer exposed. Post procedure Diagnosis Wound #2: Same as Pre-Procedure General Notes: scribed for Dr. Lady Garyannon by Samuella Bruinaylor Herrington, RN. Plan Follow-up Appointments: Return Appointment in 1 week. - Dr. Lady Garyannon - room 2 Anesthetic: Wound #2 Right,Lateral Lower Leg: (In clinic) Topical Lidocaine 4% applied to wound bed Bathing/ Shower/ Hygiene: May shower with protection but do not get wound dressing(s) wet. - may purchase a cast protector  from [facility] or [facility] Edema Control - Lymphedema / SCD / Other: Elevate legs to the level of the heart or above for 30 minutes daily and/or  when sitting, a frequency of: Avoid standing for long periods of time. Patient to wear own compression stockings every day. Exercise regularly Moisturize legs daily. Compression stocking or Garment 20-30 mm/Hg pressure to: - to L left until R leg is healed The following medication(s) was prescribed: lidocaine topical 4 % cream cream topical was prescribed at facility amoxicillin-pot clavulanate oral 875 mg-125 mg tablet 1 tab p.o. twice daily x7 days starting 05/03/2022 WOUND #2: - Lower Leg Wound Laterality: Right, Lateral Cleanser: Soap and Water 1 x Per Week/30 Days Discharge Instructions: May shower and wash wound with dial antibacterial soap and water prior to dressing change. Cleanser: Wound Cleanser 1 x Per Week/30 Days Discharge Instructions: Cleanse the wound with wound cleanser prior to applying a clean dressing using gauze sponges, not tissue or cotton balls. Peri-Wound Care: Sween Lotion (Moisturizing lotion) 1 x Per Week/30 Days Discharge Instructions: Apply moisturizing lotion as directed Topical: Gentamicin 1 x Per Week/30 Days Discharge Instructions: As directed by physician Prim Dressing: Hydrofera Blue Ready Foam, 4x5 in 1 x Per Week/30 Days ary Discharge Instructions: Apply to wound bed as instructed Secondary Dressing: Woven Gauze Sponge, Non-Sterile 4x4 in 1 x Per Week/30 Days Discharge Instructions: Apply over primary dressing as directed. Secured With: Coban Self-Adherent Wrap 4x5 (in/yd) 1 x Per Week/30 Days Discharge Instructions: Secure with Coban as directed. Secured With: [employer], 4.5x3.1 (in/yd) 1 x Per Week/30 Days Discharge Instructions: Secure with Kerlix as directed. Secured With: 43M Medipore H Soft Cloth Surgical T ape, 4 x 10 (in/yd) 1 x Per Week/30 Days Discharge Instructions: Secure with tape as  directed. 05/03/2022: The wound is a little smaller this week. There is still a fair amount of slough on the surface. His drainage was a little bit as greenish today, although not the blue-green typically associated with Pseudomonas. No significant odor. The underlying granulation tissue appears healthy. I used a curette to debride the slough from the wound surface. We will continue using topical gentamicin with Hydrofera Blue ready foam. I have also sent in a prescription for a 1 week course of oral Augmentin. Follow-up in 1 week's time. Electronic Signature(s) Signed: 05/03/2022 12:13:04 PM By: Fredirick Maudlin MD FACS Signed: 05/03/2022 5:24:32 PM By: Adline Peals Previous Signature: 05/03/2022 9:27:37 AM Version By: Fredirick Maudlin MD FACS Entered By: Adline Peals on 05/03/2022 11:40:58 -------------------------------------------------------------------------------- HxROS Details Patient Name: Date of Service: Anthony Barnes, Anthony Barnes. 05/03/2022 8:30 A M Medical Record Number: [id number] Patient Account Number: [id number] Date of Birth/Sex: Treating RN: 06/02/1956 (66 y.o. M) Primary Care Provider: Charlott Rakes Other Clinician: Referring Provider: Treating Provider/Extender: Carter Kitten Weeks in Treatment: 17 Information Obtained From Patient Constitutional Symptoms (General Health) Medical History: Past Medical History Notes: obesity Eyes Medical History: Negative for: Cataracts; Glaucoma; Optic Neuritis Ear/Nose/Mouth/Throat Medical History: Negative for: Chronic sinus problems/congestion; Middle ear problems Hematologic/Lymphatic Medical History: Negative for: Anemia; Hemophilia; Human Immunodeficiency Virus; Lymphedema; Sickle Cell Disease Respiratory Medical History: Negative for: Aspiration; Asthma; Chronic Obstructive Pulmonary Disease (COPD); Pneumothorax; Sleep Apnea; Tuberculosis Cardiovascular Medical History: Positive for:  Hypertension Negative for: Angina; Arrhythmia; Congestive Heart Failure; Coronary Artery Disease; Deep Vein Thrombosis; Hypotension; Myocardial Infarction; Peripheral Arterial Disease; Peripheral Venous Disease; Phlebitis; Vasculitis Gastrointestinal Medical History: Negative for: Cirrhosis ; Colitis; Crohns; Hepatitis A; Hepatitis B; Hepatitis C Past Medical History Notes: diverticulitis Endocrine Medical History: Positive for: Type II Diabetes Time with diabetes: 8 years Treated with: Oral agents Blood sugar tested every day: No Genitourinary Medical History: Negative for:  End Stage Renal Disease Immunological Medical History: Negative for: Lupus Erythematosus; Raynauds; Scleroderma Integumentary (Skin) Medical History: Negative for: History of Burn Musculoskeletal Medical History: Negative for: Gout; Rheumatoid Arthritis; Osteoarthritis; Osteomyelitis Neurologic Medical History: Negative for: Dementia; Neuropathy; Quadriplegia; Paraplegia; Seizure Disorder Past Medical History Notes: neuropathy Oncologic Medical History: Negative for: Received Chemotherapy; Received Radiation Psychiatric Medical History: Negative for: Anorexia/bulimia; Confinement Anxiety Immunizations Pneumococcal Vaccine: Received Pneumococcal Vaccination: No Immunization Notes: tetanus 2 years ago Implantable Devices None Hospitalization / Surgery History Type of Hospitalization/Surgery esophagogastroduodenoscopy brain aneurysm surgery Family and Social History Cancer: Yes - Mother; Diabetes: Yes - Mother,Father; Heart Disease: No; Hereditary Spherocytosis: No; Hypertension: Yes - Mother,Father; Kidney Disease: Yes - Siblings; Lung Disease: No; Seizures: No; Stroke: Yes - Father; Thyroid Problems: No; Tuberculosis: No; Former smoker - ended on 08/14/1990; Marital Status - Married; Alcohol Use: Moderate; Drug Use: No History; Caffeine Use: Daily; Financial Concerns: No; Food, Clothing or Shelter  Needs: No; Support System Lacking: No; Transportation Concerns: No Electronic Signature(s) Signed: 05/03/2022 12:13:04 PM By: Duanne Guess MD FACS Entered By: Duanne Guess on 05/03/2022 16:10:96 -------------------------------------------------------------------------------- SuperBill Details Patient Name: Date of Service: Anthony Barnes, Anthony Barnes. 05/03/2022 Medical Record Number: [id number] Patient Account Number: [id number] Date of Birth/Sex: Treating RN: [birth year] (66 y.o. M) Primary Care Provider: Hoy Register Other Clinician: Referring Provider: Treating Provider/Extender: Camillo Flaming Weeks in Treatment: 17 Diagnosis Coding ICD-10 Codes Code Description [phone] Non-pressure chronic ulcer of other part of right lower leg with fat layer exposed E11.622 Type 2 diabetes mellitus with other skin ulcer E11.9 Type 2 diabetes mellitus without complications I10 Essential (primary) hypertension I87.2 Venous insufficiency (chronic) (peripheral) Facility Procedures CPT4 Code: [id number] Description: 7597 - DEBRIDE WOUND 1ST 20 SQ CM OR < ICD-10 Diagnosis Description E11.622 Type 2 diabetes mellitus with other skin ulcer Modifier: Quantity: 1 Physician Procedures : CPT4 Code Description Modifier [id number] - WC PHYS LEVEL 4 - EST PT 25 ICD-10 Diagnosis Description 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osed E11.622 Type 2 diabetes mellitus with other skin ulcer  I87.2 Venous insufficiency (chronic) (peripheral) I10 Essential (primary) hypertension Quantity: 1 : [id number] - WC PHYS DEBR WO ANESTH 20 SQ CM ICD-10 Diagnosis Description E11.622 Type 2 diabetes mellitus with other skin ulcer Quantity: 1 Electronic Signature(s) Signed: 05/03/2022 9:27:55 AM By: Duanne Guess MD FACS Entered By: Duanne Guess on 05/03/2022 09:27:55

## 2022-05-10 ENCOUNTER — Encounter (HOSPITAL_BASED_OUTPATIENT_CLINIC_OR_DEPARTMENT_OTHER): Payer: Medicare Other | Admitting: General Surgery

## 2022-05-10 DIAGNOSIS — I872 Venous insufficiency (chronic) (peripheral): Secondary | ICD-10-CM | POA: Diagnosis not present

## 2022-05-10 DIAGNOSIS — L97812 Non-pressure chronic ulcer of other part of right lower leg with fat layer exposed: Secondary | ICD-10-CM | POA: Diagnosis not present

## 2022-05-10 DIAGNOSIS — E11622 Type 2 diabetes mellitus with other skin ulcer: Secondary | ICD-10-CM | POA: Diagnosis not present

## 2022-05-10 DIAGNOSIS — I10 Essential (primary) hypertension: Secondary | ICD-10-CM | POA: Diagnosis not present

## 2022-05-10 NOTE — Progress Notes (Signed)
AVARI, [name] ([id number]) Visit Report for 05/10/2022 Chief Complaint Document Details Patient Name: Date of Service: Anthony Barnes [name] 05/10/2022 9:15 A M Medical Record Number: [id number] Patient Account Number: [id number] Date of Birth/Sex: Treating RN: 1956/04/13 (66 y.o. [name] Primary Care Provider: Hoy Register Other Clinician: Referring Provider: Treating Provider/Extender: Camillo Flaming Weeks in Treatment: 18 Information Obtained from: Patient Chief Complaint Right LE Ulcer Electronic Signature(s) Signed: 05/10/2022 9:44:34 AM By: Duanne Guess MD FACS Entered By: Duanne Guess on 05/10/2022 09:44:34 -------------------------------------------------------------------------------- Debridement Details Patient Name: Date of Service: Anthony Barnes, Anthony [name] 05/10/2022 9:15 A M Medical Record Number: [id number] Patient Account Number: [id number] Date of Birth/Sex: Treating RN: [birth year] (66 y.o. [name] Primary Care Provider: Hoy Register Other Clinician: Referring Provider: Treating Provider/Extender: Camillo Flaming Weeks in Treatment: 18 Debridement Performed for Assessment: Wound #2 Right,Lateral Lower Leg Performed By: Physician Duanne Guess, MD Debridement Type: Debridement Severity of Tissue Pre Debridement: Fat layer exposed Level of Consciousness (Pre-procedure): Awake and Alert Pre-procedure Verification/Time Out Yes - 09:34 Taken: Start Time: 09:34 Pain Control: Lidocaine 4% T opical Solution T Area Debrided (L x W): otal 4.5 (cm) x 1.5 (cm) = 6.75 (cm) Tissue and other material debrided: Non-Viable, Slough, Slough Level: Non-Viable Tissue Debridement Description: Selective/Open Wound Instrument: Curette Bleeding: Minimum Hemostasis Achieved: Pressure Procedural Pain: 4 Post Procedural Pain: 0 Response to Treatment: Procedure was tolerated well Level of Consciousness  (Post- Awake and Alert procedure): Post Debridement Measurements of Total Wound Length: (cm) 4.5 Width: (cm) 1.5 Depth: (cm) 0.1 Volume: (cm) 0.53 Character of Wound/Ulcer Post Debridement: Improved Severity of Tissue Post Debridement: Fat layer exposed Post Procedure Diagnosis Same as Pre-procedure Notes scribed for Dr. Lady Gary by Samuella Bruin, RN Electronic Signature(s) Signed: 05/10/2022 12:22:25 PM By: Duanne Guess MD FACS Signed: 05/10/2022 5:00:18 PM By: Samuella Bruin Entered By: Samuella Bruin on 05/10/2022 09:35:48 -------------------------------------------------------------------------------- HPI Details Patient Name: Date of Service: Anthony Barnes, Anthony [name] 05/10/2022 9:15 A M Medical Record Number: [id number] Patient Account Number: [id number] Date of Birth/Sex: Treating RN: [birth year] (65 y.o. [name] Primary Care Provider: Hoy Register Other Clinician: Referring Provider: Treating Provider/Extender: Camillo Flaming Weeks in Treatment: 18 History of Present Illness HPI Description: ADMISSION 03/12/18 This is a 66 year old and it was a type II diabetic reasonably poorly controlled with a recent hemoglobin A1c of 8.2. He was tending to his lawn on 01/15/18 when his weed [name] sent a rock up word hitting him in the right anterior leg. He was seen in his primary M.D. office is same time. An x-ray showed no abnormality. He was given a course of cephalexin. Since then he's been applying peroxide and topical antibiotics to the wound. He does not have a history of chronic wounds however he does have chronic edema in the lower legs. He is complaining of pain in the right leg wound but no real history of claudication. Past medical history includes type 2 diabetes, hypertension, morbid obesity. ABIs in the right leg were noncompressible 03/19/18 on evaluation today patient appears to be tolerating the wrap in general fairly well. He  states he's not having that much pain in regard to the wound itself. With that being said he is having some issues with slough buildup on the surface of the wound the Iodoflex does seem to be helpful in that regard. He is still having discomfort he wonders if I can send in a prescription for ibuprofen or something of  like to help him out. I do believe that the prox end may be of benefit for him. 03/25/18 on evaluation today patient appears to be doing very well in regard to his right lateral lower [name]. He's been tolerating the dressing changes without complication that is the wraps. Nonetheless he does continue to have pain he states is been dreading the possibility of having to have debridement again today. His first debridement experience was not optimal. With that being said he does seem to be showing signs of improvement there does appear to be little bit more granulation he does have a lot of slough that really does need to breeding away. 04/04/18;the patient went for arterial studies that were really quite normal. ABI on the right at 1.19 with triphasic waveforms on the left 1.11 with triphasic waveforms. TBI 0.93 on the right and 0.95 on the left. This suggests T should be able to tolerate even 4 layer compression. He is however complaining of a lot of pain with our wraps I'm wondering whether the pain is simply Iodoflex. I changed him to [employer] I'm still going to try to keep him in 3 layer compression 04/12/18 on evaluation today patient actually appears to be doing rather well in regard to his right lower [name] ulcer. He is making good progress although it is slow still I do believe that the Santyl is much better than the Iodoflex. The good news is the patient seems to be tolerating the dressing changes without complication now that we've gotten good of the Iodoflex which really did burn him quite significantly. Otherwise there's no evidence of  infection. 04/17/18 on evaluation today patient actually appears to be showing signs of improvement in regard to the ulcer. Unfortunately he's had somewhat of a rough day due to the fact that he found out that one of his friends suddenly and unexpectedly passed today. He states he's not sure he's in the frame of mind to allow me to debride the wound at this point. Nonetheless I do feel like he is showing signs of the wound getting better little by little each week. 04/24/18 on evaluation today patient's ulcer actually appears to be showing some signs of improvement albeit slow. He has been tolerating the dressing changes without complication except for the wrap which she states was so tight that he had to remove it it was causing a lot of discomfort. Fortunately there is no evidence of infection at this point. He states he only wore the wrap until about the time he got home last week and that he had to take it off. Nonetheless he does not have any other compression stockings, Juxta-Lite wraps, or anything otherwise to really help at this point as far as compression is concerned. 05/01/18 on evaluation today patient actually appears to be doing fairly well in regard to his lower extremity ulcer. He has been tolerating the dressing changes currently without complication. The wrap does seem to be helping as far as the fluid management is concerned. Wound bed itself actually show signs of good improvement although there is some Slough noted there's not as much as previous and he actually has fairly decent granulation noted as well. Overall I'm pleased with the progress of to this point. The patient would prefer not to have any debridement today he states he's actually not feeling too well in general and he really does not want any additional discomfort typically debridement is fairly uncomfortable for him. 05/08/18 on  evaluation today patient actually appears to be doing a little better in regard to his lower  extremity ulcer. He has been tolerating the dressing changes without complication. With that being said I'm actually quite pleased with the fact the wound is not appear to be infected and in general has made a little progress. With that being said I do believe we need to perform some debridement to clean away the necrotic tissue on the surface of the wound. 05/15/18 on evaluation today patient actually appears to be doing a little better in regard to his wound. Fortunately there is some improvement noted fortunately there's also no significant evidence of infection at this point in time. He does have continued pain that really nothing different than previous he did get his Juxta-Lite wrap. 05/29/18 on evaluation today patient actually appears to be doing somewhat better in regard to his wound currently. He's been tolerating the dressing changes without complication. With that being said he has been performing the [facility] Dressing changes every day at home. I'm pretty sure that we told him every other day last week or when I saw him rather two weeks ago. With that being said obviously did not hurt to do it daily just that obviously is gonna run out of supplies sooner and that could get him into trouble as far as his insurance is concerned. Nonetheless he at this point still continues to have pain although honestly I don't feel like it's as bad as what it was in the past just based on his reactions at this point. 06/12/18 on evaluation today patient actually appears to be doing better in regard to his lower [name] ulcer. He's been tolerating the dressing changes without complication. Fortunately there does not appear to be any evidence of infection at this time. No fevers, chills, nausea, or vomiting noted at this time. 06/19/18 evaluation today patient's ulcer on the lower extremity appears to be doing better at this point. he has been tolerating the dressing changes. The patient seems to be  somewhat depressed about the progress of his wound although the overall appearance at this time seems to be doing well. In general I'm very pleased with the appearance today he does have some biofilm on the surface of the wound as well as of hyper granular tissue we may want to utilize a little bit of silver nitrate today. 06/27/2018; patient comes into clinic today for a wound on the right lateral calf likely related to chronic venous insufficiency. He has been using medihoney covered with Hydrofera Blue. Wound surface actually looks quite good 07/03/2018 seen today for follow-up and management of lower extremity ulcer right lateral shin. T olerating dressing changes. He has a small amount of biofilm today. Will attempt to remove a portion of the biofilm as he tolerates. He becomes very anxious with wound treatments. He would benefit from layer compression wraps however he refuses compression wraps or anything that smokes his legs. He expressed an inability to tolerate compression wraps. Juxta lite wraps have been ordered. He has been instructed on the appropriate application of the juxta leg wraps. His blood pressure today on visit was 200/120. He denies any blurred vision, chest pain, dizziness, shortness of breath, or difficulty with mobility. Mr. Jablonowski stated that he had a recent visit with his primary care provider. At that time his carvedilol was decreased from 25 mg daily to 12.5 mg daily. Strongly encouraged Mr. Zambito to seek medical attention today during visit. He declined transport for evaluation  of elevated blood pressure. Encouraged him to contact his primary care provider today for medication management. He stated that he would call his primary care doctor after wound visit today. Also encouraged him that if he experienced any symptoms of blurred vision, chest pain, shortness of breath to immediately seek medical attention. 07/17/18 on evaluation today patient's wound actually does  seem to show signs of improvement based on the overall appearance of the wound bed today. There does not appear to show any signs of infection which is good news. There is no overall worsening which is also good news. He still has hyper granular tissue will use in the [employer] followed by [facility] Dressing this point. 07/31/18 on evaluation today patient's wound bed actually show signs of improvement with good epithelialization especially in the upper portion of the wound. He has been tolerating the dressing changes without complication in general. Overall I'm extremely happy with how things stand. No fevers, chills, nausea, or vomiting noted at this time. 08/28/18 on evaluation today patient appears to be doing a little better in regard to his lower extremity ulcer. With that being said it appears to be very hyper granular I think this is directly attributed to the fact that he continues to use the [employer] underneath the [facility] Dressing. Although this helps not to stick unfortunately also think he's not getting the benefit of the [facility] Dressing particular and subsequently is causing too much moisture buildup hence the hyper granulation. Fortunately there does not appear to be any signs of infection at this time which is good news. 09/03/18 on evaluation today patient appears to be doing well in regard to his lower [name] ulcer. He has been tolerating the dressing changes without complication. Fortunately he has much less hyper granular tissue the noted last week I do think using silver nitrate in changing to using the [facility] Dressing without the mepitel has made a big difference for him this is good news. 09/18/18 on evaluation today patient appears to be doing excellent in regard to his right lower [name] ulcer. This is actually significantly smaller even compared to the last evaluation. Overall I'm very pleased in this regard. I'm a recommend that we likely repeat the  silver nitrate today based on what I'm seeing. 10/02/18 on evaluation today patient appears to be doing excellent in regard to his lower extremity wound on the right. He's been tolerating the dressing changes without complication. Fortunately there's no signs of infection at this time. Overall been very pleased with how things seem to be progressing. No fevers, chills, nausea, or vomiting noted at this time. 10/16/18 on evaluation today patient actually appears to be doing much better in regard to his lower extremity wound on the right. This is shown signs of good improvement and is indeed measuring smaller he is on a excellent track as far as healing is concerned. My hope is this will be healed of the next several weeks. Fortunately there is no evidence of infection at this time. He does seem to be doing everything that I'm recommending for him 10/30/18 on evaluation today patient appears to be doing better in regard to his lower extremity ulcer. He's been tolerating the dressing changes without complication. Fortunately the Hydrofera Blue Dressing to be doing the job. He has made excellent progress. With that being said this is still going somewhat slow but nonetheless is always making good progress at this point. 5/18-Patient was in to be seen for right leg ulcer that  appeared after scab above it was removed today. This area had healed completely. 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 The wound has not healed though it has been nearly 2 months since the injury occurred. He was seen in the emergency department at [facility] on May 9. At that time it was very painful and he described it as a "15 on a scale of 010". He also  was found to have 3+ pitting edema to the bilateral lower extremities. He was prescribed a weeks course of Keflex. He is here today because the wound has failed to heal and he has a prior history in our clinic. ABI in clinic today was noncompressible. He did have formal vascular studies performed in 2019 which were normal. The last hemoglobin A1c I have available for review was from March 21, 2021 and was elevated at 7.7%. The wound is fairly small and circular located about 3 inches above his right lateral malleolus. It is completely covered with eschar. No surrounding erythema, no odor, no purulent drainage. 01/11/2022: The wound on his right lateral leg is a little bit smaller today. It has reaccumulated some slough and a small bit of eschar. It remains fairly painful. He has another area on his anterior tibia that he will not let me touch but I am concerned that there is a wound forming underneath the surface. 01/18/2022: Unfortunately, his wound is a little bit bigger today. He continues to accumulate slough on the wound surface. It is still quite tender. 01/25/2022: The patient bumped his wound on the car door which resulted in bleeding. He was seen at urgent care where it was redressed. Unfortunately, the wound is bigger again today. There is accumulated slough on the surface. Urgent care gave him some tramadol, apparently and he took this prior to his visit so he could tolerate debridement better. 02/02/2022: The wound is a little bit smaller today. The surface, however, has accumulated a fairly thick layer of slough. The periwound skin is intact and there is no obvious sign of infection. 02/07/2022: The wound is a bit larger today. It has thick slough accumulation. The periwound skin is intact and there is no obvious erythema, induration, nor any odor. 02/16/2022: I took a culture last week due to the appearance of the wound and the patient's degree of pain. This grew out methicillin sensitive Staph  aureus. Augmentin was prescribed. The patient states that the pain is markedly improved today. The wound also looks much better. It is smaller with less slough buildup and there is good granulation tissue emerging. 02/21/2022: The patient's pain is quite improved from prior visits. The wound is about the same size and has some accumulated slough on the surface. The base is a little bit fibrotic but there are some areas of granulation tissue filling in. 02/28/2022: The wound is a little bit narrower on its measurements but slightly longer. There is still some slough accumulation on the surface, but he has minimal pain and there is no concern for ongoing infection. Granulation tissue is emerging. 03/07/2022: During the week, he cut holes in his wrap because it was painful and too tight. As a result, his leg was quite a bit more swollen today and  his wound was larger. The wound surface is fairly clean but a little bit dry. Minimal slough accumulation. Granulation tissue present. 03/14/2022: Once again, he did some arts and crafts projects with his wrap. On the other hand, however, his wound does look better. It is smaller today and is beginning to fill in. Minimal slough and a small amount of eschar accumulation. 03/27/2022: His wound measured bigger today, although it does not appear to be so on my impression. The wound continues to fill with granulation tissue. There is a little bit of slough and eschar accumulation. He continues to cut his wrap off of his feet and his edema control at the ankle is particularly poor with 3+ pitting edema. His insurance will not cover any sort of skin substitute unfortunately. 04/04/2022: The wound is smaller today. It is more superficial with good granulation tissue and just a little bit of slough. He continues to cut away portions of his wrap which results in inadequate edema control. 04/11/2022: His wound measured a couple millimeters larger today. It continues to fill with  granulation tissue and there is minimal slough on the surface. 04/19/2022: No significant change in the wound dimensions, but the surface is healthier and less fibrotic. 04/26/2022: The wound is a little bit bigger again today. There is slough accumulation on the surface. Edema control is inadequate. He has been cutting the foot portion of his wrap off each week. 05/03/2022: The wound is a little smaller this week. There is still a fair amount of slough on the surface. His drainage was a little bit as greenish today, although not the blue-green typically associated with Pseudomonas. No significant odor. The underlying granulation tissue appears healthy. The culture that I took last week grew out methicillin sensitive Staph aureus. 05/10/2022: The wound is again a little bit smaller this week. He still has slough accumulation on the surface. Most of the surface has fairly decent granulation tissue, but there is still a fibrotic portion in the center near the caudal aspect of the wound. He did not pick up the Augmentin that I prescribed for his MSSA infection. He says he will get it today. Electronic Signature(s) Signed: 05/10/2022 9:45:33 AM By: Duanne Guess MD FACS Entered By: Duanne Guess on 05/10/2022 09:45:33 -------------------------------------------------------------------------------- Physical Exam Details Patient Name: Date of Service: Anthony Barnes, Anthony [name] 05/10/2022 9:15 A M Medical Record Number: [id number] Patient Account Number: [id number] Date of Birth/Sex: Treating RN: 19-Nov-1955 (66 y.o. [name] Primary Care Provider: Hoy Register Other Clinician: Referring Provider: Treating Provider/Extender: Camillo Flaming Weeks in Treatment: 4 Constitutional Hypertensive, asymptomatic. . . . No acute distress.Marland Kitchen Respiratory Normal work of breathing on room air.. Notes 05/10/2022: The wound is again a little bit smaller this week. He still has slough  accumulation on the surface. Most of the surface has fairly decent granulation tissue, but there is still a fibrotic portion in the center near the caudal aspect of the wound. Electronic Signature(s) Signed: 05/10/2022 9:46:01 AM By: Duanne Guess MD FACS Entered By: Duanne Guess on 05/10/2022 09:46:01 -------------------------------------------------------------------------------- Physician Orders Details Patient Name: Date of Service: Anthony Barnes, Anthony [name] 05/10/2022 9:15 A M Medical Record Number: [id number] Patient Account Number: [id number] Date of Birth/Sex: Treating RN: 15-Jun-1956 (66 y.o. [name] Primary Care Provider: Hoy Register Other Clinician: Referring Provider: Treating Provider/Extender: Camillo Flaming Weeks in Treatment: 4 Verbal / Phone Orders: No Diagnosis Coding ICD-10 Coding Code Description [phone] Non-pressure chronic ulcer of other part of  right lower leg with fat layer exposed E11.622 Type 2 diabetes mellitus with other skin ulcer E11.9 Type 2 diabetes mellitus without complications I10 Essential (primary) hypertension I87.2 Venous insufficiency (chronic) (peripheral) Follow-up Appointments ppointment in 1 week. - Dr. Lady Gary - room 2 Return A Anesthetic Wound #2 Right,Lateral Lower Leg (In clinic) Topical Lidocaine 4% applied to wound bed Bathing/ Shower/ Hygiene May shower with protection but do not get wound dressing(s) wet. - may purchase a cast protector from [facility] or [facility] Edema Control - Lymphedema / SCD / Other Elevate legs to the level of the heart or above for 30 minutes daily and/or when sitting, a frequency of: Avoid standing for long periods of time. Patient to wear own compression stockings every day. Exercise regularly Moisturize legs daily. Compression stocking or Garment 20-30 mm/Hg pressure to: - to L left until R leg is healed Wound Treatment Wound #2 - Lower Leg Wound Laterality: Right,  Lateral Cleanser: Soap and Water 1 x Per Week/30 Days Discharge Instructions: May shower and wash wound with dial antibacterial soap and water prior to dressing change. Cleanser: Wound Cleanser 1 x Per Week/30 Days Discharge Instructions: Cleanse the wound with wound cleanser prior to applying a clean dressing using gauze sponges, not tissue or cotton balls. Peri-Wound Care: Sween Lotion (Moisturizing lotion) 1 x Per Week/30 Days Discharge Instructions: Apply moisturizing lotion as directed Topical: Gentamicin 1 x Per Week/30 Days Discharge Instructions: As directed by physician Prim Dressing: Hydrofera Blue Ready Foam, 4x5 in 1 x Per Week/30 Days ary Discharge Instructions: Apply to wound bed as instructed Secondary Dressing: Woven Gauze Sponge, Non-Sterile 4x4 in 1 x Per Week/30 Days Discharge Instructions: Apply over primary dressing as directed. Secured With: Coban Self-Adherent Wrap 4x5 (in/yd) 1 x Per Week/30 Days Discharge Instructions: Secure with Coban as directed. Secured With: [employer], 4.5x3.1 (in/yd) 1 x Per Week/30 Days Discharge Instructions: Secure with Kerlix as directed. Secured With: 38M Medipore H Soft Cloth Surgical T ape, 4 x 10 (in/yd) 1 x Per Week/30 Days Discharge Instructions: Secure with tape as directed. Patient Medications llergies: Shellfish Containing Products, codeine A Notifications Medication Indication Start End 05/10/2022 lidocaine DOSE topical 4 % cream - cream topical Electronic Signature(s) Signed: 05/10/2022 12:22:25 PM By: Duanne Guess MD FACS Entered By: Duanne Guess on 05/10/2022 09:46:14 -------------------------------------------------------------------------------- Problem List Details Patient Name: Date of Service: Anthony Barnes, Anthony [name] 05/10/2022 9:15 A M Medical Record Number: [id number] Patient Account Number: [id number] Date of Birth/Sex: Treating RN: 1956/06/18 (65 y.o. [name] Primary Care Provider:  Hoy Register Other Clinician: Referring Provider: Treating Provider/Extender: Camillo Flaming Weeks in Treatment: 65 Active Problems ICD-10 Encounter Code Description Active Date MDM Diagnosis L97.812 Non-pressure chronic ulcer of other part of right lower leg with fat layer 01/02/2022 No Yes exposed E11.622 Type 2 diabetes mellitus with other skin ulcer 01/02/2022 No Yes E11.9 Type 2 diabetes mellitus without complications 01/02/2022 No Yes I10 Essential (primary) hypertension 01/02/2022 No Yes I87.2 Venous insufficiency (chronic) (peripheral) 01/02/2022 No Yes Inactive Problems Resolved Problems Electronic Signature(s) Signed: 05/10/2022 9:44:17 AM By: Duanne Guess MD FACS Entered By: Duanne Guess on 05/10/2022 09:44:17 -------------------------------------------------------------------------------- Progress Note Details Patient Name: Date of Service: Anthony Barnes, Anthony [name] 05/10/2022 9:15 A M Medical Record Number: [id number] Patient Account Number: [id number] Date of Birth/Sex: Treating RN: 1956-03-09 (66 y.o. [name] Primary Care Provider: Hoy Register Other Clinician: Referring Provider: Treating Provider/Extender: Camillo Flaming Weeks in Treatment: 18 Subjective Chief Complaint  Information obtained from Patient Right LE Ulcer History of Present Illness (HPI) ADMISSION 03/12/18 This is a 66 year old and it was a type II diabetic reasonably poorly controlled with a recent hemoglobin A1c of 8.2. He was tending to his lawn on 01/15/18 when his weed [name] sent a rock up word hitting him in the right anterior leg. He was seen in his primary M.D. office is same time. An x-ray showed no abnormality. He was given a course of cephalexin. Since then he's been applying peroxide and topical antibiotics to the wound. He does not have a history of chronic wounds however he does have chronic edema in the lower legs. He is  complaining of pain in the right leg wound but no real history of claudication. Past medical history includes type 2 diabetes, hypertension, morbid obesity. ABIs in the right leg were noncompressible 03/19/18 on evaluation today patient appears to be tolerating the wrap in general fairly well. He states he's not having that much pain in regard to the wound itself. With that being said he is having some issues with slough buildup on the surface of the wound the Iodoflex does seem to be helpful in that regard. He is still having discomfort he wonders if I can send in a prescription for ibuprofen or something of like to help him out. I do believe that the prox end may be of benefit for him. 03/25/18 on evaluation today patient appears to be doing very well in regard to his right lateral lower [name]. He's been tolerating the dressing changes without complication that is the wraps. Nonetheless he does continue to have pain he states is been dreading the possibility of having to have debridement again today. His first debridement experience was not optimal. With that being said he does seem to be showing signs of improvement there does appear to be little bit more granulation he does have a lot of slough that really does need to breeding away. 04/04/18;the patient went for arterial studies that were really quite normal. ABI on the right at 1.19 with triphasic waveforms on the left 1.11 with triphasic waveforms. TBI 0.93 on the right and 0.95 on the left. This suggests T should be able to tolerate even 4 layer compression. He is however complaining of a lot of pain with our wraps I'm wondering whether the pain is simply Iodoflex. I changed him to [employer] I'm still going to try to keep him in 3 layer compression 04/12/18 on evaluation today patient actually appears to be doing rather well in regard to his right lower [name] ulcer. He is making good progress although it  is slow still I do believe that the Santyl is much better than the Iodoflex. The good news is the patient seems to be tolerating the dressing changes without complication now that we've gotten good of the Iodoflex which really did burn him quite significantly. Otherwise there's no evidence of infection. 04/17/18 on evaluation today patient actually appears to be showing signs of improvement in regard to the ulcer. Unfortunately he's had somewhat of a rough day due to the fact that he found out that one of his friends suddenly and unexpectedly passed today. He states he's not sure he's in the frame of mind to allow me to debride the wound at this point. Nonetheless I do feel like he is showing signs of the wound getting better little by little each week. 04/24/18 on evaluation today patient's ulcer actually appears to  be showing some signs of improvement albeit slow. He has been tolerating the dressing changes without complication except for the wrap which she states was so tight that he had to remove it it was causing a lot of discomfort. Fortunately there is no evidence of infection at this point. He states he only wore the wrap until about the time he got home last week and that he had to take it off. Nonetheless he does not have any other compression stockings, Juxta-Lite wraps, or anything otherwise to really help at this point as far as compression is concerned. 05/01/18 on evaluation today patient actually appears to be doing fairly well in regard to his lower extremity ulcer. He has been tolerating the dressing changes currently without complication. The wrap does seem to be helping as far as the fluid management is concerned. Wound bed itself actually show signs of good improvement although there is some Slough noted there's not as much as previous and he actually has fairly decent granulation noted as well. Overall I'm pleased with the progress of to this point. The patient would prefer not to have  any debridement today he states he's actually not feeling too well in general and he really does not want any additional discomfort typically debridement is fairly uncomfortable for him. 05/08/18 on evaluation today patient actually appears to be doing a little better in regard to his lower extremity ulcer. He has been tolerating the dressing changes without complication. With that being said I'm actually quite pleased with the fact the wound is not appear to be infected and in general has made a little progress. With that being said I do believe we need to perform some debridement to clean away the necrotic tissue on the surface of the wound. 05/15/18 on evaluation today patient actually appears to be doing a little better in regard to his wound. Fortunately there is some improvement noted fortunately there's also no significant evidence of infection at this point in time. He does have continued pain that really nothing different than previous he did get his Juxta-Lite wrap. 05/29/18 on evaluation today patient actually appears to be doing somewhat better in regard to his wound currently. He's been tolerating the dressing changes without complication. With that being said he has been performing the [facility] Dressing changes every day at home. I'm pretty sure that we told him every other day last week or when I saw him rather two weeks ago. With that being said obviously did not hurt to do it daily just that obviously is gonna run out of supplies sooner and that could get him into trouble as far as his insurance is concerned. Nonetheless he at this point still continues to have pain although honestly I don't feel like it's as bad as what it was in the past just based on his reactions at this point. 06/12/18 on evaluation today patient actually appears to be doing better in regard to his lower [name] ulcer. He's been tolerating the dressing changes without complication. Fortunately there does not  appear to be any evidence of infection at this time. No fevers, chills, nausea, or vomiting noted at this time. 06/19/18 evaluation today patient's ulcer on the lower extremity appears to be doing better at this point. he has been tolerating the dressing changes. The patient seems to be somewhat depressed about the progress of his wound although the overall appearance at this time seems to be doing well. In general I'm very pleased with the appearance today  he does have some biofilm on the surface of the wound as well as of hyper granular tissue we may want to utilize a little bit of silver nitrate today. 06/27/2018; patient comes into clinic today for a wound on the right lateral calf likely related to chronic venous insufficiency. He has been using medihoney covered with Hydrofera Blue. Wound surface actually looks quite good 07/03/2018 seen today for follow-up and management of lower extremity ulcer right lateral shin. T olerating dressing changes. He has a small amount of biofilm today. Will attempt to remove a portion of the biofilm as he tolerates. He becomes very anxious with wound treatments. He would benefit from layer compression wraps however he refuses compression wraps or anything that smokes his legs. He expressed an inability to tolerate compression wraps. Juxta lite wraps have been ordered. He has been instructed on the appropriate application of the juxta leg wraps. His blood pressure today on visit was 200/120. He denies any blurred vision, chest pain, dizziness, shortness of breath, or difficulty with mobility. Mr. Mcilhenny stated that he had a recent visit with his primary care provider. At that time his carvedilol was decreased from 25 mg daily to 12.5 mg daily. Strongly encouraged Mr. Hoel to seek medical attention today during visit. He declined transport for evaluation of elevated blood pressure. Encouraged him to contact his primary care provider today for  medication management. He stated that he would call his primary care doctor after wound visit today. Also encouraged him that if he experienced any symptoms of blurred vision, chest pain, shortness of breath to immediately seek medical attention. 07/17/18 on evaluation today patient's wound actually does seem to show signs of improvement based on the overall appearance of the wound bed today. There does not appear to show any signs of infection which is good news. There is no overall worsening which is also good news. He still has hyper granular tissue will use in the [employer] followed by [facility] Dressing this point. 07/31/18 on evaluation today patient's wound bed actually show signs of improvement with good epithelialization especially in the upper portion of the wound. He has been tolerating the dressing changes without complication in general. Overall I'm extremely happy with how things stand. No fevers, chills, nausea, or vomiting noted at this time. 08/28/18 on evaluation today patient appears to be doing a little better in regard to his lower extremity ulcer. With that being said it appears to be very hyper granular I think this is directly attributed to the fact that he continues to use the [employer] underneath the [facility] Dressing. Although this helps not to stick unfortunately also think he's not getting the benefit of the [facility] Dressing particular and subsequently is causing too much moisture buildup hence the hyper granulation. Fortunately there does not appear to be any signs of infection at this time which is good news. 09/03/18 on evaluation today patient appears to be doing well in regard to his lower [name] ulcer. He has been tolerating the dressing changes without complication. Fortunately he has much less hyper granular tissue the noted last week I do think using silver nitrate in changing to using the [facility] Dressing without the mepitel has made a big  difference for him this is good news. 09/18/18 on evaluation today patient appears to be doing excellent in regard to his right lower [name] ulcer. This is actually significantly smaller even compared to the last evaluation. Overall I'm very pleased in this regard. I'm a  recommend that we likely repeat the silver nitrate today based on what I'm seeing. 10/02/18 on evaluation today patient appears to be doing excellent in regard to his lower extremity wound on the right. He's been tolerating the dressing changes without complication. Fortunately there's no signs of infection at this time. Overall been very pleased with how things seem to be progressing. No fevers, chills, nausea, or vomiting noted at this time. 10/16/18 on evaluation today patient actually appears to be doing much better in regard to his lower extremity wound on the right. This is shown signs of good improvement and is indeed measuring smaller he is on a excellent track as far as healing is concerned. My hope is this will be healed of the next several weeks. Fortunately there is no evidence of infection at this time. He does seem to be doing everything that I'm recommending for him 10/30/18 on evaluation today patient appears to be doing better in regard to his lower extremity ulcer. He's been tolerating the dressing changes without complication. Fortunately the Hydrofera Blue Dressing to be doing the job. He has made excellent progress. With that being said this is still going somewhat slow but nonetheless is always making good progress at this point. 5/18-Patient was in to be seen for right leg ulcer that appeared after scab above it was removed today. This area had healed completely. It appears that no further action is required on this READMISSION 01/02/2022 This is a now 66 year old male with poorly controlled type 2 diabetes mellitus and chronic venous insufficiency who once again was performing lawn care when a rock was flung up by  his weedeater and it struck him in the right lateral lower leg. The wound has not healed though it has been nearly 2 months since the injury occurred. He was seen in the emergency department at [facility] on May 9. At that time it was very painful and he described it as a "15 on a scale of 0oo10". He also was found to have 3+ pitting edema to the bilateral lower extremities. He was prescribed a weeks course of Keflex. He is here today because the wound has failed to heal and he has a prior history in our clinic. ABI in clinic today was noncompressible. He did have formal vascular studies performed in 2019 which were normal. The last hemoglobin A1c I have available for review was from March 21, 2021 and was elevated at 7.7%. The wound is fairly small and circular located about 3 inches above his right lateral malleolus. It is completely covered with eschar. No surrounding erythema, no odor, no purulent drainage. 01/11/2022: The wound on his right lateral leg is a little bit smaller today. It has reaccumulated some slough and a small bit of eschar. It remains fairly painful. He has another area on his anterior tibia that he will not let me touch but I am concerned that there is a wound forming underneath the surface. 01/18/2022: Unfortunately, his wound is a little bit bigger today. He continues to accumulate slough on the wound surface. It is still quite tender. 01/25/2022: The patient bumped his wound on the car door which resulted in bleeding. He was seen at urgent care where it was redressed. Unfortunately, the wound is bigger again today. There is accumulated slough on the surface. Urgent care gave him some tramadol, apparently and he took this prior to his visit so he could tolerate debridement better. 02/02/2022: The wound is a little bit smaller today. The surface,  however, has accumulated a fairly thick layer of slough. The periwound skin is intact and there is no obvious sign of  infection. 02/07/2022: The wound is a bit larger today. It has thick slough accumulation. The periwound skin is intact and there is no obvious erythema, induration, nor any odor. 02/16/2022: I took a culture last week due to the appearance of the wound and the patient's degree of pain. This grew out methicillin sensitive Staph aureus. Augmentin was prescribed. The patient states that the pain is markedly improved today. The wound also looks much better. It is smaller with less slough buildup and there is good granulation tissue emerging. 02/21/2022: The patient's pain is quite improved from prior visits. The wound is about the same size and has some accumulated slough on the surface. The base is a little bit fibrotic but there are some areas of granulation tissue filling in. 02/28/2022: The wound is a little bit narrower on its measurements but slightly longer. There is still some slough accumulation on the surface, but he has minimal pain and there is no concern for ongoing infection. Granulation tissue is emerging. 03/07/2022: During the week, he cut holes in his wrap because it was painful and too tight. As a result, his leg was quite a bit more swollen today and his wound was larger. The wound surface is fairly clean but a little bit dry. Minimal slough accumulation. Granulation tissue present. 03/14/2022: Once again, he did some arts and crafts projects with his wrap. On the other hand, however, his wound does look better. It is smaller today and is beginning to fill in. Minimal slough and a small amount of eschar accumulation. 03/27/2022: His wound measured bigger today, although it does not appear to be so on my impression. The wound continues to fill with granulation tissue. There is a little bit of slough and eschar accumulation. He continues to cut his wrap off of his feet and his edema control at the ankle is particularly poor with 3+ pitting edema. His insurance will not cover any sort of skin  substitute unfortunately. 04/04/2022: The wound is smaller today. It is more superficial with good granulation tissue and just a little bit of slough. He continues to cut away portions of his wrap which results in inadequate edema control. 04/11/2022: His wound measured a couple millimeters larger today. It continues to fill with granulation tissue and there is minimal slough on the surface. 04/19/2022: No significant change in the wound dimensions, but the surface is healthier and less fibrotic. 04/26/2022: The wound is a little bit bigger again today. There is slough accumulation on the surface. Edema control is inadequate. He has been cutting the foot portion of his wrap off each week. 05/03/2022: The wound is a little smaller this week. There is still a fair amount of slough on the surface. His drainage was a little bit as greenish today, although not the blue-green typically associated with Pseudomonas. No significant odor. The underlying granulation tissue appears healthy. The culture that I took last week grew out methicillin sensitive Staph aureus. 05/10/2022: The wound is again a little bit smaller this week. He still has slough accumulation on the surface. Most of the surface has fairly decent granulation tissue, but there is still a fibrotic portion in the center near the caudal aspect of the wound. He did not pick up the Augmentin that I prescribed for his MSSA infection. He says he will get it today. Patient History Information obtained from Patient. Family History  Cancer - Mother, Diabetes - Mother,Father, Hypertension - Mother,Father, Kidney Disease - Siblings, Stroke - Father, No family history of Heart Disease, Hereditary Spherocytosis, Lung Disease, Seizures, Thyroid Problems, Tuberculosis. Social History Former smoker - ended on 08/14/1990, Marital Status - Married, Alcohol Use - Moderate, Drug Use - No History, Caffeine Use - Daily. Medical History Eyes Denies history of Cataracts,  Glaucoma, Optic Neuritis Ear/Nose/Mouth/Throat Denies history of Chronic sinus problems/congestion, Middle ear problems Hematologic/Lymphatic Denies history of Anemia, Hemophilia, Human Immunodeficiency Virus, Lymphedema, Sickle Cell Disease Respiratory Denies history of Aspiration, Asthma, Chronic Obstructive Pulmonary Disease (COPD), Pneumothorax, Sleep Apnea, Tuberculosis Cardiovascular Patient has history of Hypertension Denies history of Angina, Arrhythmia, Congestive Heart Failure, Coronary Artery Disease, Deep Vein Thrombosis, Hypotension, Myocardial Infarction, Peripheral Arterial Disease, Peripheral Venous Disease, Phlebitis, Vasculitis Gastrointestinal Denies history of Cirrhosis , Colitis, Crohnoos, Hepatitis A, Hepatitis B, Hepatitis C Endocrine Patient has history of Type II Diabetes Genitourinary Denies history of End Stage Renal Disease Immunological Denies history of Lupus Erythematosus, Raynaudoos, Scleroderma Integumentary (Skin) Denies history of History of Burn Musculoskeletal Denies history of Gout, Rheumatoid Arthritis, Osteoarthritis, Osteomyelitis Neurologic Denies history of Dementia, Neuropathy, Quadriplegia, Paraplegia, Seizure Disorder Oncologic Denies history of Received Chemotherapy, Received Radiation Psychiatric Denies history of Anorexia/bulimia, Confinement Anxiety Hospitalization/Surgery History - esophagogastroduodenoscopy. - brain aneurysm surgery. Medical A Surgical History Notes nd Constitutional Symptoms (General Health) obesity Gastrointestinal diverticulitis Neurologic neuropathy Objective Constitutional Hypertensive, asymptomatic. No acute distress.. Vitals Time Taken: 9:21 AM, Height: 75 in, Weight: 310 lbs, BMI: 38.7, Temperature: 97.9 F, Pulse: 79 bpm, Respiratory Rate: 18 breaths/min, Blood Pressure: 190/112 mmHg. Respiratory Normal work of breathing on room air.. General Notes: 05/10/2022: The wound is again a little bit  smaller this week. He still has slough accumulation on the surface. Most of the surface has fairly decent granulation tissue, but there is still a fibrotic portion in the center near the caudal aspect of the wound. Integumentary (Hair, Skin) Wound #2 status is Open. Original cause of wound was Trauma. The date acquired was: 12/03/2021. The wound has been in treatment 18 weeks. The wound is located on the Right,Lateral Lower Leg. The wound measures 4.5cm length x 1.5cm width x 0.1cm depth; 5.301cm^2 area and 0.53cm^3 volume. There is Fat Layer (Subcutaneous Tissue) exposed. There is no tunneling or undermining noted. There is a medium amount of serosanguineous drainage noted. The wound margin is distinct with the outline attached to the wound base. There is medium (34-66%) red granulation within the wound bed. There is a medium (34-66%) amount of necrotic tissue within the wound bed including Eschar and Adherent Slough. Assessment Active Problems ICD-10 Non-pressure chronic ulcer of other part of right lower leg with fat layer exposed Type 2 diabetes mellitus with other skin ulcer Type 2 diabetes mellitus without complications Essential (primary) hypertension Venous insufficiency (chronic) (peripheral) Procedures Wound #2 Pre-procedure diagnosis of Wound #2 is a Venous Leg Ulcer located on the Right,Lateral Lower Leg .Severity of Tissue Pre Debridement is: Fat layer exposed. There was a Selective/Open Wound Non-Viable Tissue Debridement with a total area of 6.75 sq cm performed by Fredirick Maudlin, MD. With the following instrument(s): Curette to remove Non-Viable tissue/material. Material removed includes [facility] after achieving pain control using Lidocaine 4% Topical Solution. No specimens were taken. A time out was conducted at 09:34, prior to the start of the procedure. A Minimum amount of bleeding was controlled with Pressure. The procedure was tolerated well with a pain level of 4 throughout  and a pain level of 0  following the procedure. Post Debridement Measurements: 4.5cm length x 1.5cm width x 0.1cm depth; 0.53cm^3 volume. Character of Wound/Ulcer Post Debridement is improved. Severity of Tissue Post Debridement is: Fat layer exposed. Post procedure Diagnosis Wound #2: Same as Pre-Procedure General Notes: scribed for Dr. Lady Garyannon by Samuella Bruinaylor Herrington, RN. Plan Follow-up Appointments: Return Appointment in 1 week. - Dr. Lady Garyannon - room 2 Anesthetic: Wound #2 Right,Lateral Lower Leg: (In clinic) Topical Lidocaine 4% applied to wound bed Bathing/ Shower/ Hygiene: May shower with protection but do not get wound dressing(s) wet. - may purchase a cast protector from [facility] or [facility] Edema Control - Lymphedema / SCD / Other: Elevate legs to the level of the heart or above for 30 minutes daily and/or when sitting, a frequency of: Avoid standing for long periods of time. Patient to wear own compression stockings every day. Exercise regularly Moisturize legs daily. Compression stocking or Garment 20-30 mm/Hg pressure to: - to L left until R leg is healed The following medication(s) was prescribed: lidocaine topical 4 % cream cream topical was prescribed at facility WOUND #2: - Lower Leg Wound Laterality: Right, Lateral Cleanser: Soap and Water 1 x Per Week/30 Days Discharge Instructions: May shower and wash wound with dial antibacterial soap and water prior to dressing change. Cleanser: Wound Cleanser 1 x Per Week/30 Days Discharge Instructions: Cleanse the wound with wound cleanser prior to applying a clean dressing using gauze sponges, not tissue or cotton balls. Peri-Wound Care: Sween Lotion (Moisturizing lotion) 1 x Per Week/30 Days Discharge Instructions: Apply moisturizing lotion as directed Topical: Gentamicin 1 x Per Week/30 Days Discharge Instructions: As directed by physician Prim Dressing: Hydrofera Blue Ready Foam, 4x5 in 1 x Per Week/30 Days ary Discharge Instructions:  Apply to wound bed as instructed Secondary Dressing: Woven Gauze Sponge, Non-Sterile 4x4 in 1 x Per Week/30 Days Discharge Instructions: Apply over primary dressing as directed. Secured With: Coban Self-Adherent Wrap 4x5 (in/yd) 1 x Per Week/30 Days Discharge Instructions: Secure with Coban as directed. Secured With: [employer] Roll Sterile, 4.5x3.1 (in/yd) 1 x Per Week/30 Days Discharge Instructions: Secure with Kerlix as directed. Secured With: 33M Medipore H Soft Cloth Surgical T ape, 4 x 10 (in/yd) 1 x Per Week/30 Days Discharge Instructions: Secure with tape as directed. 05/10/2022: The wound is again a little bit smaller this week. He still has slough accumulation on the surface. Most of the surface has fairly decent granulation tissue, but there is still a fibrotic portion in the center near the caudal aspect of the wound. I used a curette to debride the slough from the wound. We will continue to use topical gentamicin with Hydrofera Blue, Kerlix and Coban. He will pick up his Augmentin today and begin taking this. Follow-up in 1 week. Electronic Signature(s) Signed: 05/10/2022 9:46:45 AM By: Duanne Guessannon, Jenniferann Stuckert MD FACS Entered By: Duanne Guessannon, Dutchess Crosland on 05/10/2022 09:46:44 -------------------------------------------------------------------------------- HxROS Details Patient Name: Date of Service: Anthony HarborGLA DNEY, Anthony [name] 05/10/2022 9:15 A M Medical Record Number: [id number] Patient Account Number: [id number] Date of Birth/Sex: Treating RN: 01-24-1956 (66 y.o. [name]) Herrington, Taylor Primary Care Provider: Hoy RegisterNewlin, Enobong Other Clinician: Referring Provider: Treating Provider/Extender: Camillo Flamingannon, Kaileia Flow Newlin, Enobong Weeks in Treatment: 18 Information Obtained From Patient Constitutional Symptoms (General Health) Medical History: Past Medical History Notes: obesity Eyes Medical History: Negative for: Cataracts; Glaucoma; Optic Neuritis Ear/Nose/Mouth/Throat Medical History: Negative for:  Chronic sinus problems/congestion; Middle ear problems Hematologic/Lymphatic Medical History: Negative for: Anemia; Hemophilia; Human Immunodeficiency Virus; Lymphedema; Sickle Cell Disease Respiratory Medical History: Negative for:  Aspiration; Asthma; Chronic Obstructive Pulmonary Disease (COPD); Pneumothorax; Sleep Apnea; Tuberculosis Cardiovascular Medical History: Positive for: Hypertension Negative for: Angina; Arrhythmia; Congestive Heart Failure; Coronary Artery Disease; Deep Vein Thrombosis; Hypotension; Myocardial Infarction; Peripheral Arterial Disease; Peripheral Venous Disease; Phlebitis; Vasculitis Gastrointestinal Medical History: Negative for: Cirrhosis ; Colitis; Crohns; Hepatitis A; Hepatitis B; Hepatitis C Past Medical History Notes: diverticulitis Endocrine Medical History: Positive for: Type II Diabetes Time with diabetes: 8 years Treated with: Oral agents Blood sugar tested every day: No Genitourinary Medical History: Negative for: End Stage Renal Disease Immunological Medical History: Negative for: Lupus Erythematosus; Raynauds; Scleroderma Integumentary (Skin) Medical History: Negative for: History of Burn Musculoskeletal Medical History: Negative for: Gout; Rheumatoid Arthritis; Osteoarthritis; Osteomyelitis Neurologic Medical History: Negative for: Dementia; Neuropathy; Quadriplegia; Paraplegia; Seizure Disorder Past Medical History Notes: neuropathy Oncologic Medical History: Negative for: Received Chemotherapy; Received Radiation Psychiatric Medical History: Negative for: Anorexia/bulimia; Confinement Anxiety Immunizations Pneumococcal Vaccine: Received Pneumococcal Vaccination: No Immunization Notes: tetanus 2 years ago Implantable Devices None Hospitalization / Surgery History Type of Hospitalization/Surgery esophagogastroduodenoscopy brain aneurysm surgery Family and Social History Cancer: Yes - Mother; Diabetes: Yes -  Mother,Father; Heart Disease: No; Hereditary Spherocytosis: No; Hypertension: Yes - Mother,Father; Kidney Disease: Yes - Siblings; Lung Disease: No; Seizures: No; Stroke: Yes - Father; Thyroid Problems: No; Tuberculosis: No; Former smoker - ended on 08/14/1990; Marital Status - Married; Alcohol Use: Moderate; Drug Use: No History; Caffeine Use: Daily; Financial Concerns: No; Food, Clothing or Shelter Needs: No; Support System Lacking: No; Transportation Concerns: No Electronic Signature(s) Signed: 05/10/2022 12:22:25 PM By: Duanne Guess MD FACS Signed: 05/10/2022 5:00:18 PM By: Gelene Mink By: Duanne Guess on 05/10/2022 09:45:39 -------------------------------------------------------------------------------- SuperBill Details Patient Name: Date of Service: Anthony Barnes, Anthony [name] 05/10/2022 Medical Record Number: [id number] Patient Account Number: [id number] Date of Birth/Sex: Treating RN: 1955/09/15 (66 y.o. [name] Primary Care Provider: Hoy Register Other Clinician: Referring Provider: Treating Provider/Extender: Camillo Flaming Weeks in Treatment: 18 Diagnosis Coding ICD-10 Codes Code Description [phone] Non-pressure chronic ulcer of other part of right lower leg with fat layer exposed E11.622 Type 2 diabetes mellitus with other skin ulcer E11.9 Type 2 diabetes mellitus without complications I10 Essential (primary) hypertension I87.2 Venous insufficiency (chronic) (peripheral) Facility Procedures CPT4 Code: [id number] Description: [phone] - DEBRIDE WOUND 1ST 20 SQ CM OR < ICD-10 Diagnosis Description 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os Modifier: ed Quantity: 1 Physician Procedures : CPT4 Code Description Modifier [id number] - WC PHYS LEVEL 4 - EST PT 25 ICD-10 Diagnosis Description 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osed  E11.622 Type 2 diabetes mellitus with other skin ulcer  I87.2 Venous insufficiency (chronic) (peripheral) I10 Essential (primary) hypertension Quantity: 1 : [id number] - WC PHYS DEBR WO ANESTH 20 SQ CM ICD-10 Diagnosis Description 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osed Quantity: 1 Electronic Signature(s) Signed: 05/10/2022 9:47:19 AM By: Duanne Guess MD FACS Entered By: Duanne Guess on 05/10/2022 09:47:18

## 2022-05-10 NOTE — Progress Notes (Signed)
[name], Barnes ([id number]) Visit Report for 05/10/2022 Arrival Information Details Patient Name: Date of Service: Anthony Barnes 05/10/2022 9:15 A M Medical Record Number: [id number] Patient Account Number: [id number] Date of Birth/Sex: Treating RN: 1956-06-09 (66 y.o. [name] Primary Care Jozalyn Baglio: Charlott Rakes Other Clinician: Referring Jezlyn Westerfield: Treating Doral Ventrella/Extender: Carter Kitten Weeks in Treatment: 18 Visit Information History Since Last Visit Added or deleted any medications: No Patient Arrived: Ambulatory Any new allergies or adverse reactions: No Arrival Time: 09:20 Had a fall or experienced change in No Accompanied By: self activities of daily living that may affect Transfer Assistance: None risk of falls: Patient Identification Verified: Yes Signs or symptoms of abuse/neglect since last visito No Secondary Verification Process Completed: Yes Hospitalized since last visit: No Patient Requires Transmission-Based Precautions: No Implantable device outside of the clinic excluding No Patient Has Alerts: No cellular tissue based products placed in the center since last visit: Has Dressing in Place as Prescribed: Yes Has Compression in Place as Prescribed: Yes Pain Present Now: No Electronic Signature(s) Signed: 05/10/2022 5:00:18 PM By: Adline Peals Entered By: Adline Peals on 05/10/2022 09:20:59 -------------------------------------------------------------------------------- Encounter Discharge Information Details Patient Name: Date of Service: Anthony Barnes, [name] 05/10/2022 9:15 A M Medical Record Number: [id number] Patient Account Number: [id number] Date of Birth/Sex: Treating RN: [birth year] (66 y.o. [name] Primary Care Muhannad Bignell: Charlott Rakes Other Clinician: Referring Gustav Knueppel: Treating Llewellyn Choplin/Extender: Carter Kitten Weeks in Treatment: 103 Encounter Discharge  Information Items Post Procedure Vitals Discharge Condition: Stable Temperature (F): 97.9 Ambulatory Status: Ambulatory Pulse (bpm): 79 Discharge Destination: Home Respiratory Rate (breaths/min): 18 Transportation: Private Auto Blood Pressure (mmHg): 159/106 Accompanied By: self Schedule Follow-up Appointment: Yes Clinical Summary of Care: Patient Declined Electronic Signature(s) Signed: 05/10/2022 5:00:18 PM By: Adline Peals Entered By: Adline Peals on 05/10/2022 09:49:36 -------------------------------------------------------------------------------- Lower Extremity Assessment Details Patient Name: Date of Service: Anthony Barnes [name] 05/10/2022 9:15 A M Medical Record Number: [id number] Patient Account Number: [id number] Date of Birth/Sex: Treating RN: Oct 19, 1955 (66 y.o. [name] Primary Care Jammy Stlouis: Charlott Rakes Other Clinician: Referring Stevee Valenta: Treating Ritesh Opara/Extender: Carter Kitten Weeks in Treatment: 18 Edema Assessment Assessed: [Left: No] [Right: No] E[Left: dema] [Right: :] Calf Left: Right: Point of Measurement: From Medial Instep 44.2 cm Ankle Left: Right: Point of Measurement: From Medial Instep 27.5 cm Vascular Assessment Pulses: Dorsalis Pedis Palpable: [Right:Yes] Electronic Signature(s) Signed: 05/10/2022 5:00:18 PM By: Adline Peals Entered By: Adline Peals on 05/10/2022 09:27:59 -------------------------------------------------------------------------------- Multi Wound Chart Details Patient Name: Date of Service: Anthony Barnes, [name] 05/10/2022 9:15 A M Medical Record Number: [id number] Patient Account Number: [id number] Date of Birth/Sex: Treating RN: August 24, 1955 (66 y.o. [name] Primary Care [name]: Charlott Rakes Other Clinician: Referring Rudolfo Brandow: Treating Ramani Riva/Extender: Carter Kitten Weeks in Treatment: 18 Vital Signs Height(in):  75 Pulse(bpm): 79 Weight(lbs): 310 Blood Pressure(mmHg): 190/112 Body Mass Index(BMI): 38.7 Temperature(F): 97.9 Respiratory Rate(breaths/min): 18 Photos: [N/A:N/A] Right, Lateral Lower Leg N/A N/A Wound Location: Trauma N/A N/A Wounding Event: Venous Leg Ulcer N/A N/A Primary Etiology: Hypertension, Type II Diabetes N/A N/A Comorbid History: 12/03/2021 N/A N/A Date Acquired: 37 N/A N/A Weeks of Treatment: Open N/A N/A Wound Status: No N/A N/A Wound Recurrence: 4.5x1.5x0.1 N/A N/A Measurements L x W x D (cm) 5.301 N/A N/A A (cm) : rea 0.53 N/A N/A Volume (cm) : -400.10% N/A N/A % Reduction in A rea: -400.00% N/A N/A % Reduction in Volume: Full Thickness Without  Exposed N/A N/A Classification: Support Structures Medium N/A N/A Exudate A mount: Serosanguineous N/A N/A Exudate Type: red, brown N/A N/A Exudate Color: Distinct, outline attached N/A N/A Wound Margin: Medium (34-66%) N/A N/A Granulation A mount: Red N/A N/A Granulation Quality: Medium (34-66%) N/A N/A Necrotic A mount: Eschar, Adherent Slough N/A N/A Necrotic Tissue: Fat Layer (Subcutaneous Tissue): Yes N/A N/A Exposed Structures: Fascia: No Tendon: No Muscle: No Joint: No Bone: No Small (1-33%) N/A N/A Epithelialization: Debridement - Selective/Open Wound N/A N/A Debridement: Pre-procedure Verification/Time Out 09:34 N/A N/A Taken: Lidocaine 4% Topical Solution N/A N/A Pain Control: Slough N/A N/A Tissue Debrided: Non-Viable Tissue N/A N/A Level: 6.75 N/A N/A Debridement A (sq cm): rea Curette N/A N/A Instrument: Minimum N/A N/A Bleeding: Pressure N/A N/A Hemostasis A chieved: 4 N/A N/A Procedural Pain: 0 N/A N/A Post Procedural Pain: Procedure was tolerated well N/A N/A Debridement Treatment Response: 4.5x1.5x0.1 N/A N/A Post Debridement Measurements L x W x D (cm) 0.53 N/A N/A Post Debridement Volume: (cm) Debridement N/A N/A Procedures  Performed: Treatment Notes Electronic Signature(s) Signed: 05/10/2022 9:44:24 AM By: Fredirick Maudlin MD FACS Signed: 05/10/2022 5:00:18 PM By: Adline Peals Entered By: Fredirick Maudlin on 05/10/2022 09:44:24 -------------------------------------------------------------------------------- Multi-Disciplinary Care Plan Details Patient Name: Date of Service: Anthony Barnes, [name] 05/10/2022 9:15 A M Medical Record Number: [id number] Patient Account Number: [id number] Date of Birth/Sex: Treating RN: 25-Aug-1955 (67 y.o. [name] Primary Care Jaquaya Coyle: Charlott Rakes Other Clinician: Referring Elai Vanwyk: Treating Jimi Schappert/Extender: Carter Kitten Weeks in Treatment: 18 Active Inactive Venous Leg Ulcer Nursing Diagnoses: Actual venous Insuffiency (use after diagnosis is confirmed) Knowledge deficit related to disease process and management Goals: Patient will maintain optimal edema control Date Initiated: 01/02/2022 Target Resolution Date: 06/09/2022 Goal Status: Active Patient/caregiver will verbalize understanding of disease process and disease management Date Initiated: 01/02/2022 Date Inactivated: 03/07/2022 Target Resolution Date: 03/09/2022 Goal Status: Met Interventions: Assess peripheral edema status every visit. Compression as ordered Provide education on venous insufficiency Treatment Activities: Non-invasive vascular studies : 01/02/2022 T ordered outside of clinic : 01/02/2022 est Therapeutic compression applied : 01/02/2022 Notes: Wound/Skin Impairment Nursing Diagnoses: Impaired tissue integrity Knowledge deficit related to ulceration/compromised skin integrity Goals: Patient/caregiver will verbalize understanding of skin care regimen Date Initiated: 01/02/2022 Date Inactivated: 02/02/2022 Target Resolution Date: 02/03/2022 Goal Status: Met Ulcer/skin breakdown will have a volume reduction of 30% by week 4 Date Initiated:  01/02/2022 Target Resolution Date: 06/09/2022 Goal Status: Active Interventions: Assess ulceration(s) every visit Provide education on ulcer and skin care Treatment Activities: Skin care regimen initiated : 01/02/2022 Topical wound management initiated : 01/02/2022 Notes: Electronic Signature(s) Signed: 05/10/2022 5:00:18 PM By: Adline Peals Entered By: Adline Peals on 05/10/2022 09:32:53 -------------------------------------------------------------------------------- Pain Assessment Details Patient Name: Date of Service: Anthony Barnes, [name] 05/10/2022 9:15 A M Medical Record Number: [id number] Patient Account Number: [id number] Date of Birth/Sex: Treating RN: [birth year] (66 y.o. [name] Primary Care Ryon Layton: Charlott Rakes Other Clinician: Referring Sinai Illingworth: Treating Sola Margolis/Extender: Carter Kitten Weeks in Treatment: 18 Active Problems Location of Pain Severity and Description of Pain Patient Has Paino No Site Locations Rate the pain. Rate the pain. Current Pain Level: 0 Pain Management and Medication Current Pain Management: Electronic Signature(s) Signed: 05/10/2022 5:00:18 PM By: Adline Peals Entered By: Adline Peals on 05/10/2022 09:21:25 -------------------------------------------------------------------------------- Patient/Caregiver Education Details Patient Name: Date of Service: Anthony Barnes, Spearman. 9/27/2023andnbsp9:15 [place] Record Number: [id number] Patient Account Number: [id number] Date of Birth/Gender: Treating RN: [birth year] (  66 y.o. [name] Primary Care Physician: Charlott Rakes Other Clinician: Referring Physician: Treating Physician/Extender: Carter Kitten Weeks in Treatment: 66 Education Assessment Education Provided To: Patient Education Topics Provided Wound/Skin Impairment: Methods: Explain/Verbal Responses: Reinforcements needed, State  content correctly Electronic Signature(s) Signed: 05/10/2022 5:00:18 PM By: Adline Peals Entered By: Adline Peals on 05/10/2022 09:33:08 -------------------------------------------------------------------------------- Wound Assessment Details Patient Name: Date of Service: Anthony Barnes, [name] 05/10/2022 9:15 A M Medical Record Number: [id number] Patient Account Number: [id number] Date of Birth/Sex: Treating RN: July 07, 1956 (66 y.o. [name] Primary Care Nyema Hachey: Charlott Rakes Other Clinician: Referring Ulys Favia: Treating Meesha Sek/Extender: Carter Kitten Weeks in Treatment: 18 Wound Status Wound Number: 2 Primary Etiology: Venous Leg Ulcer Wound Location: Right, Lateral Lower Leg Wound Status: Open Wounding Event: Trauma Comorbid History: Hypertension, Type II Diabetes Date Acquired: 12/03/2021 Weeks Of Treatment: 18 Clustered Wound: No Photos Wound Measurements Length: (cm) 4.5 Width: (cm) 1.5 Depth: (cm) 0.1 Area: (cm) 5.301 Volume: (cm) 0.53 % Reduction in Area: -400.1% % Reduction in Volume: -400% Epithelialization: Small (1-33%) Tunneling: No Undermining: No Wound Description Classification: Full Thickness Without Exposed Support Structures Wound Margin: Distinct, outline attached Exudate Amount: Medium Exudate Type: Serosanguineous Exudate Color: red, brown Foul Odor After Cleansing: No Slough/Fibrino Yes Wound Bed Granulation Amount: Medium (34-66%) Exposed Structure Granulation Quality: Red Fascia Exposed: No Necrotic Amount: Medium (34-66%) Fat Layer (Subcutaneous Tissue) Exposed: Yes Necrotic Quality: Eschar, Adherent Slough Tendon Exposed: No Muscle Exposed: No Joint Exposed: No Bone Exposed: No Treatment Notes Wound #2 (Lower Leg) Wound Laterality: Right, Lateral Cleanser Soap and Water Discharge Instruction: May shower and wash wound with dial antibacterial soap and water prior to dressing  change. Wound Cleanser Discharge Instruction: Cleanse the wound with wound cleanser prior to applying a clean dressing using gauze sponges, not tissue or cotton balls. Peri-Wound Care Sween Lotion (Moisturizing lotion) Discharge Instruction: Apply moisturizing lotion as directed Topical Gentamicin Discharge Instruction: As directed by physician Primary Dressing Hydrofera Blue Ready Foam, 4x5 in Discharge Instruction: Apply to wound bed as instructed Secondary Dressing Woven Gauze Sponge, Non-Sterile 4x4 in Discharge Instruction: Apply over primary dressing as directed. Secured With [employer] 4x5 (in/yd) Discharge Instruction: Secure with Coban as directed. Kerlix Roll Sterile, 4.5x3.1 (in/yd) Discharge Instruction: Secure with Kerlix as directed. 28M Medipore H Soft Cloth Surgical T ape, 4 x 10 (in/yd) Discharge Instruction: Secure with tape as directed. Compression Wrap Compression Stockings Add-Ons Electronic Signature(s) Signed: 05/10/2022 5:00:18 PM By: Adline Peals Entered By: Adline Peals on 05/10/2022 09:31:13 -------------------------------------------------------------------------------- Vitals Details Patient Name: Date of Service: Anthony Barnes, [name] 05/10/2022 9:15 A M Medical Record Number: [id number] Patient Account Number: [id number] Date of Birth/Sex: Treating RN: 1956-01-22 (66 y.o. [name] Primary Care Katriel Cutsforth: Charlott Rakes Other Clinician: Referring Chestina Komatsu: Treating Freddi Schrager/Extender: Carter Kitten Weeks in Treatment: 18 Vital Signs Time Taken: 09:21 Temperature (F): 97.9 Height (in): 75 Pulse (bpm): 79 Weight (lbs): 310 Respiratory Rate (breaths/min): 18 Body Mass Index (BMI): 38.7 Blood Pressure (mmHg): 190/112 Reference Range: 80 - 120 mg / dl Electronic Signature(s) Signed: 05/10/2022 5:00:18 PM By: Adline Peals Entered By: Adline Peals on 05/10/2022 09:21:20

## 2022-05-18 ENCOUNTER — Other Ambulatory Visit: Payer: Self-pay | Admitting: Family Medicine

## 2022-05-18 NOTE — Telephone Encounter (Signed)
Pt has a surgery for debridement tomorrow, he says he will be in severe pain tomorrow after the procedure and desperately wants to pick this up today. Please advise

## 2022-05-19 ENCOUNTER — Encounter (HOSPITAL_BASED_OUTPATIENT_CLINIC_OR_DEPARTMENT_OTHER): Payer: Medicare Other | Attending: General Surgery | Admitting: General Surgery

## 2022-05-19 DIAGNOSIS — I872 Venous insufficiency (chronic) (peripheral): Secondary | ICD-10-CM | POA: Diagnosis not present

## 2022-05-19 DIAGNOSIS — Z6838 Body mass index (BMI) 38.0-38.9, adult: Secondary | ICD-10-CM | POA: Diagnosis not present

## 2022-05-19 DIAGNOSIS — L97812 Non-pressure chronic ulcer of other part of right lower leg with fat layer exposed: Secondary | ICD-10-CM | POA: Diagnosis not present

## 2022-05-19 DIAGNOSIS — E11622 Type 2 diabetes mellitus with other skin ulcer: Secondary | ICD-10-CM | POA: Diagnosis not present

## 2022-05-19 DIAGNOSIS — I10 Essential (primary) hypertension: Secondary | ICD-10-CM | POA: Diagnosis not present

## 2022-05-19 NOTE — Progress Notes (Signed)
Anthony Barnes, Anthony Barnes ([id number]) Visit Report for 05/19/2022 Arrival Information Details Patient Name: Date of Service: Anthony Barnes 05/19/2022 9:15 A M Medical Record Number: [id number] Patient Account Number: [id number] Date of Birth/Sex: Treating RN: 02-15-1956 (66 y.o. Anthony Barnes Primary Care Porscha Axley: Charlott Rakes Other Clinician: Referring Delano Scardino: Treating Sindy Mccune/Extender: Carter Kitten Weeks in Treatment: 17 Visit Information History Since Last Visit Added or deleted any medications: No Patient Arrived: Ambulatory Any new allergies or adverse reactions: No Arrival Time: 09:20 Had a fall or experienced change in No Accompanied By: self activities of daily living that may affect Transfer Assistance: None risk of falls: Patient Identification Verified: Yes Signs or symptoms of abuse/neglect since last visito No Secondary Verification Process Completed: Yes Hospitalized since last visit: No Patient Requires Transmission-Based Precautions: No Implantable device outside of the clinic excluding No Patient Has Alerts: No cellular tissue based products placed in the center since last visit: Has Dressing in Place as Prescribed: Yes Has Compression in Place as Prescribed: Yes Pain Present Now: No Electronic Signature(s) Signed: 05/19/2022 4:25:48 PM By: Adline Peals Entered By: Adline Peals on 05/19/2022 09:20:23 -------------------------------------------------------------------------------- Encounter Discharge Information Details Patient Name: Date of Service: Anthony Barnes, Anthony [name] 05/19/2022 9:15 A M Medical Record Number: [id number] Patient Account Number: [id number] Date of Birth/Sex: Treating RN: 1956-01-19 (66 y.o. Anthony Barnes Primary Care Anthony Barnes: Charlott Rakes Other Clinician: Referring Anthony Barnes: Treating Anthony Barnes/Extender: Carter Kitten Weeks in Treatment: 35 Encounter Discharge  Information Items Post Procedure Vitals Discharge Condition: Stable Temperature (F): 98 Ambulatory Status: Ambulatory Pulse (bpm): 78 Discharge Destination: Home Respiratory Rate (breaths/min): 18 Transportation: Private Auto Blood Pressure (mmHg): 181/93 Accompanied By: self Schedule Follow-up Appointment: Yes Clinical Summary of Care: Patient Declined Electronic Signature(s) Signed: 05/19/2022 4:25:48 PM By: Adline Peals Entered By: Adline Peals on 05/19/2022 09:55:59 -------------------------------------------------------------------------------- Lower Extremity Assessment Details Patient Name: Date of Service: Anthony Barnes [name] 05/19/2022 9:15 A M Medical Record Number: [id number] Patient Account Number: [id number] Date of Birth/Sex: Treating RN: 12-Oct-1955 (66 y.o. Anthony Barnes Primary Care [name]: Charlott Rakes Other Clinician: Referring Jarrett Chicoine: Treating Juliahna Wiswell/Extender: Carter Kitten Weeks in Treatment: 19 Edema Assessment Assessed: [Left: No] [Right: No] E[Left: dema] [Right: :] Calf Left: Right: Point of Measurement: From Medial Instep 43.5 cm Ankle Left: Right: Point of Measurement: From Medial Instep 27.6 cm Vascular Assessment Pulses: Dorsalis Pedis Palpable: [Right:Yes] Electronic Signature(s) Signed: 05/19/2022 4:25:48 PM By: Adline Peals Entered By: Adline Peals on 05/19/2022 09:27:13 -------------------------------------------------------------------------------- Multi Wound Chart Details Patient Name: Date of Service: Anthony Barnes, Anthony [name] 05/19/2022 9:15 A M Medical Record Number: [id number] Patient Account Number: [id number] Date of Birth/Sex: Treating RN: 04-04-1956 (66 y.o. M) Primary Care Omri Bertran: Charlott Rakes Other Clinician: Referring Anthony Barnes: Treating Anthony Barnes/Extender: Carter Kitten Weeks in Treatment: 19 Vital Signs Height(in): 75 Pulse(bpm):  78 Weight(lbs): 310 Blood Pressure(mmHg): 181/93 Body Mass Index(BMI): 38.7 Temperature(F): 98 Respiratory Rate(breaths/min): 18 Photos: [N/A:N/A] Right, Lateral Lower Leg N/A N/A Wound Location: Trauma N/A N/A Wounding Event: Venous Leg Ulcer N/A N/A Primary Etiology: Hypertension, Type II Diabetes N/A N/A Comorbid History: 12/03/2021 N/A N/A Date Acquired: 22 N/A N/A Weeks of Treatment: Open N/A N/A Wound Status: No N/A N/A Wound Recurrence: 4x1.5x0.1 N/A N/A Measurements L x W x D (cm) 4.712 N/A N/A A (cm) : rea 0.471 N/A N/A Volume (cm) : -344.50% N/A N/A % Reduction in A rea: -344.30% N/A N/A % Reduction in Volume: Full Thickness Without Exposed N/A  N/A Classification: Support Structures Medium N/A N/A Exudate A mount: Serosanguineous N/A N/A Exudate Type: red, brown N/A N/A Exudate Color: Distinct, outline attached N/A N/A Wound Margin: Large (67-100%) N/A N/A Granulation A mount: Red N/A N/A Granulation Quality: Small (1-33%) N/A N/A Necrotic A mount: Eschar, Adherent Slough N/A N/A Necrotic Tissue: Fat Layer (Subcutaneous Tissue): Yes N/A N/A Exposed Structures: Fascia: No Tendon: No Muscle: No Joint: No Bone: No Small (1-33%) N/A N/A Epithelialization: Debridement - Selective/Open Wound N/A N/A Debridement: Pre-procedure Verification/Time Out 09:34 N/A N/A Taken: Lidocaine 5% topical ointment N/A N/A Pain Control: Slough N/A N/A Tissue Debrided: Non-Viable Tissue N/A N/A Level: 6 N/A N/A Debridement A (sq cm): rea Curette N/A N/A Instrument: Minimum N/A N/A Bleeding: Pressure N/A N/A Hemostasis A chieved: 5 N/A N/A Procedural Pain: 0 N/A N/A Post Procedural Pain: Procedure was tolerated well N/A N/A Debridement Treatment Response: 4x1.5x0.1 N/A N/A Post Debridement Measurements L x W x D (cm) 0.471 N/A N/A Post Debridement Volume: (cm) No Abnormalities Noted N/A N/A Periwound Skin Texture: No Abnormalities  Noted N/A N/A Periwound Skin Moisture: No Abnormalities Noted N/A N/A Periwound Skin Color: No Abnormality N/A N/A Temperature: Debridement N/A N/A Procedures Performed: Treatment Notes Electronic Signature(s) Signed: 05/19/2022 9:54:22 AM By: Fredirick Maudlin MD FACS Entered By: Fredirick Maudlin on 05/19/2022 09:54:21 -------------------------------------------------------------------------------- Multi-Disciplinary Care Plan Details Patient Name: Date of Service: Anthony Barnes, Anthony [name] 05/19/2022 9:15 A M Medical Record Number: [id number] Patient Account Number: [id number] Date of Birth/Sex: Treating RN: Sep 26, 1955 (66 y.o. Anthony Barnes Primary Care Clydell Alberts: Charlott Rakes Other Clinician: Referring Ettie Krontz: Treating Terry Abila/Extender: Carter Kitten Weeks in Treatment: 19 Active Inactive Venous Leg Ulcer Nursing Diagnoses: Actual venous Insuffiency (use after diagnosis is confirmed) Knowledge deficit related to disease process and management Goals: Patient will maintain optimal edema control Date Initiated: 01/02/2022 Target Resolution Date: 06/09/2022 Goal Status: Active Patient/caregiver will verbalize understanding of disease process and disease management Date Initiated: 01/02/2022 Date Inactivated: 03/07/2022 Target Resolution Date: 03/09/2022 Goal Status: Met Interventions: Assess peripheral edema status every visit. Compression as ordered Provide education on venous insufficiency Treatment Activities: Non-invasive vascular studies : 01/02/2022 T ordered outside of clinic : 01/02/2022 est Therapeutic compression applied : 01/02/2022 Notes: Wound/Skin Impairment Nursing Diagnoses: Impaired tissue integrity Knowledge deficit related to ulceration/compromised skin integrity Goals: Patient/caregiver will verbalize understanding of skin care regimen Date Initiated: 01/02/2022 Date Inactivated: 02/02/2022 Target Resolution Date:  02/03/2022 Goal Status: Met Ulcer/skin breakdown will have a volume reduction of 30% by week 4 Date Initiated: 01/02/2022 Target Resolution Date: 06/09/2022 Goal Status: Active Interventions: Assess ulceration(s) every visit Provide education on ulcer and skin care Treatment Activities: Skin care regimen initiated : 01/02/2022 Topical wound management initiated : 01/02/2022 Notes: Electronic Signature(s) Signed: 05/19/2022 4:25:48 PM By: Adline Peals Entered By: Adline Peals on 05/19/2022 09:34:12 -------------------------------------------------------------------------------- Pain Assessment Details Patient Name: Date of Service: Anthony Barnes, Anthony [name] 05/19/2022 9:15 A M Medical Record Number: [id number] Patient Account Number: [id number] Date of Birth/Sex: Treating RN: December 05, 1955 (66 y.o. Anthony Barnes Primary Care Saisha Hogue: Charlott Rakes Other Clinician: Referring Tiffney Haughton: Treating Lorin Gawron/Extender: Carter Kitten Weeks in Treatment: 83 Active Problems Location of Pain Severity and Description of Pain Patient Has Paino No Site Locations Rate the pain. Rate the pain. Current Pain Level: 0 Pain Management and Medication Current Pain Management: Electronic Signature(s) Signed: 05/19/2022 4:25:48 PM By: Adline Peals Entered By: Adline Peals on 05/19/2022 09:20:38 -------------------------------------------------------------------------------- Patient/Caregiver Education Details Patient Name: Date of Service: Anthony Barnes,  Anthony [name] 10/6/2023andnbsp9:15 A M Medical Record Number: [id number] Patient Account Number: [id number] Date of Birth/Gender: Treating RN: 04-Feb-1956 (66 y.o. Anthony Barnes Primary Care Physician: Charlott Rakes Other Clinician: Referring Physician: Treating Physician/Extender: Carter Kitten Weeks in Treatment: 50 Education Assessment Education Provided  To: Patient Education Topics Provided Wound/Skin Impairment: Methods: Explain/Verbal Responses: Reinforcements needed, State content correctly Electronic Signature(s) Signed: 05/19/2022 4:25:48 PM By: Adline Peals Entered By: Adline Peals on 05/19/2022 09:34:22 -------------------------------------------------------------------------------- Wound Assessment Details Patient Name: Date of Service: Anthony Barnes, Anthony [name] 05/19/2022 9:15 A M Medical Record Number: [id number] Patient Account Number: [id number] Date of Birth/Sex: Treating RN: 09/11/1955 (66 y.o. Anthony Barnes Primary Care Anthony Barnes: Charlott Rakes Other Clinician: Referring Ha Shannahan: Treating Anthony Barnes/Extender: Carter Kitten Weeks in Treatment: 19 Wound Status Wound Number: 2 Primary Etiology: Venous Leg Ulcer Wound Location: Right, Lateral Lower Leg Wound Status: Open Wounding Event: Trauma Comorbid History: Hypertension, Type II Diabetes Date Acquired: 12/03/2021 Weeks Of Treatment: 19 Clustered Wound: No Photos Wound Measurements Length: (cm) 4 Width: (cm) 1.5 Depth: (cm) 0.1 Area: (cm) 4.712 Volume: (cm) 0.471 % Reduction in Area: -344.5% % Reduction in Volume: -344.3% Epithelialization: Small (1-33%) Tunneling: No Undermining: No Wound Description Classification: Full Thickness Without Exposed Support Structures Wound Margin: Distinct, outline attached Exudate Amount: Medium Exudate Type: Serosanguineous Exudate Color: red, brown Foul Odor After Cleansing: No Slough/Fibrino Yes Wound Bed Granulation Amount: Large (67-100%) Exposed Structure Granulation Quality: Red Fascia Exposed: No Necrotic Amount: Small (1-33%) Fat Layer (Subcutaneous Tissue) Exposed: Yes Necrotic Quality: Eschar, Adherent Slough Tendon Exposed: No Muscle Exposed: No Joint Exposed: No Bone Exposed: No Periwound Skin Texture Texture Color No Abnormalities Noted: Yes No  Abnormalities Noted: Yes Moisture Temperature / Pain No Abnormalities Noted: Yes Temperature: No Abnormality Treatment Notes Wound #2 (Lower Leg) Wound Laterality: Right, Lateral Cleanser Soap and Water Discharge Instruction: May shower and wash wound with dial antibacterial soap and water prior to dressing change. Wound Cleanser Discharge Instruction: Cleanse the wound with wound cleanser prior to applying a clean dressing using gauze sponges, not tissue or cotton balls. Peri-Wound Care Sween Lotion (Moisturizing lotion) Discharge Instruction: Apply moisturizing lotion as directed Topical Gentamicin Discharge Instruction: As directed by physician Primary Dressing Hydrofera Blue Ready Foam, 4x5 in Discharge Instruction: Apply to wound bed as instructed Secondary Dressing Woven Gauze Sponge, Non-Sterile 4x4 in Discharge Instruction: Apply over primary dressing as directed. Secured With [employer] 4x5 (in/yd) Discharge Instruction: Secure with Coban as directed. Kerlix Roll Sterile, 4.5x3.1 (in/yd) Discharge Instruction: Secure with Kerlix as directed. 73M Medipore H Soft Cloth Surgical T ape, 4 x 10 (in/yd) Discharge Instruction: Secure with tape as directed. Compression Wrap Compression Stockings Add-Ons Electronic Signature(s) Signed: 05/19/2022 4:25:48 PM By: Adline Peals Entered By: Adline Peals on 05/19/2022 09:30:49 -------------------------------------------------------------------------------- Vitals Details Patient Name: Date of Service: Anthony Barnes, Anthony [name] 05/19/2022 9:15 A M Medical Record Number: [id number] Patient Account Number: [id number] Date of Birth/Sex: Treating RN: 1955-09-28 (66 y.o. Anthony Barnes Primary Care Glendell Schlottman: Charlott Rakes Other Clinician: Referring Kimiyo Carmicheal: Treating Isayah Ignasiak/Extender: Carter Kitten Weeks in Treatment: 19 Vital Signs Time Taken: 09:21 Temperature (F): 98 Height  (in): 75 Pulse (bpm): 78 Weight (lbs): 310 Respiratory Rate (breaths/min): 18 Body Mass Index (BMI): 38.7 Blood Pressure (mmHg): 181/93 Reference Range: 80 - 120 mg / dl Electronic Signature(s) Signed: 05/19/2022 4:25:48 PM By: Adline Peals Entered By: Adline Peals on 05/19/2022 09:22:14

## 2022-05-19 NOTE — Progress Notes (Signed)
CARTIER, LLANES ([id number]) Visit Report for 05/19/2022 Chief Complaint Document Details Patient Name: Date of Service: Anthony Barnes Anthony E. 05/19/2022 9:15 A M Medical Record Number: [id number] Patient Account Number: [id number] Date of Birth/Sex: Treating RN: [birth year] (66 y.o. M) Primary Care Provider: Charlott Barnes Other Clinician: Referring Provider: Treating Provider/Extender: Anthony Barnes Weeks in Treatment: 87 Information Obtained from: Patient Chief Complaint Right LE Ulcer Electronic Signature(s) Signed: 05/19/2022 9:54:27 AM By: Anthony Maudlin MD FACS Entered By: Anthony Barnes on 05/19/2022 09:54:27 -------------------------------------------------------------------------------- Debridement Details Patient Name: Date of Service: Anthony Barnes, Anthony Anthony E. 05/19/2022 9:15 A M Medical Record Number: [id number] Patient Account Number: [id number] Date of Birth/Sex: Treating RN: May 12, 1956 (66 y.o. Anthony Barnes Primary Care Provider: Charlott Barnes Other Clinician: Referring Provider: Treating Provider/Extender: Anthony Barnes Weeks in Treatment: 19 Debridement Performed for Assessment: Wound #2 Right,Lateral Lower Leg Performed By: Physician Anthony Maudlin, MD Debridement Type: Debridement Severity of Tissue Pre Debridement: Fat layer exposed Level of Consciousness (Pre-procedure): Awake and Alert Pre-procedure Verification/Time Out Yes - 09:34 Taken: Start Time: 09:34 Pain Control: Lidocaine 5% topical ointment T Area Debrided (L x W): otal 4 (cm) x 1.5 (cm) = 6 (cm) Tissue and other material debrided: Non-Viable, Slough, Slough Level: Non-Viable Tissue Debridement Description: Selective/Open Wound Instrument: Curette Bleeding: Minimum Hemostasis Achieved: Pressure Procedural Pain: 5 Post Procedural Pain: 0 Response to Treatment: Procedure was tolerated well Level of Consciousness (Post- Awake and  Alert procedure): Post Debridement Measurements of Total Wound Length: (cm) 4 Width: (cm) 1.5 Depth: (cm) 0.1 Volume: (cm) 0.471 Character of Wound/Ulcer Post Debridement: Improved Severity of Tissue Post Debridement: Fat layer exposed Post Procedure Diagnosis Same as Pre-procedure Notes scribed for Dr. Celine Barnes by Anthony Peals, RN Electronic Signature(s) Signed: 05/19/2022 10:08:44 AM By: Anthony Maudlin MD FACS Signed: 05/19/2022 4:25:48 PM By: Anthony Barnes Entered By: Anthony Barnes on 05/19/2022 09:36:11 -------------------------------------------------------------------------------- HPI Details Patient Name: Date of Service: Anthony Barnes, Anthony Anthony E. 05/19/2022 9:15 A M Medical Record Number: [id number] Patient Account Number: [id number] Date of Birth/Sex: Treating RN: [birth year] (66 y.o. M) Primary Care Provider: Charlott Barnes Other Clinician: Referring Provider: Treating Provider/Extender: Anthony Barnes Weeks in Treatment: 55 History of Present Illness HPI Description: ADMISSION 03/12/18 This is a 66 year old and it was a type II diabetic reasonably poorly controlled with a recent hemoglobin A1c of 8.2. He was tending to his lawn on 01/15/18 when his weed [name] sent a rock up word hitting him in the right anterior leg. He was seen in his primary M.D. office is same time. An x-ray showed no abnormality. He was given a course of cephalexin. Since then he's been applying peroxide and topical antibiotics to the wound. He does not have a history of chronic wounds however he does have chronic edema in the lower legs. He is complaining of pain in the right leg wound but no real history of claudication. Past medical history includes type 2 diabetes, hypertension, morbid obesity. ABIs in the right leg were noncompressible 03/19/18 on evaluation today patient appears to be tolerating the wrap in general fairly well. He states he's not having that much pain in  regard to the wound itself. With that being said he is having some issues with slough buildup on the surface of the wound the Iodoflex does seem to be helpful in that regard. He is still having discomfort he wonders if I can send in a prescription for ibuprofen or something of like to help him out.  I do believe that the prox end may be of benefit for him. 03/25/18 on evaluation today patient appears to be doing very well in regard to his right lateral lower [name]. He's been tolerating the dressing changes without complication that is the wraps. Nonetheless he does continue to have pain he states is been dreading the possibility of having to have debridement again today. His first debridement experience was not optimal. With that being said he does seem to be showing signs of improvement there does appear to be little bit more granulation he does have a lot of slough that really does need to breeding away. 04/04/18;the patient went for arterial studies that were really quite normal. ABI on the right at 1.19 with triphasic waveforms on the left 1.11 with triphasic waveforms. TBI 0.93 on the right and 0.95 on the left. This suggests T should be able to tolerate even 4 layer compression. He is however complaining of a lot of pain with our wraps I'm wondering whether the pain is simply Iodoflex. I changed him to [employer]. I'm still going to try to keep him in 3 layer compression 04/12/18 on evaluation today patient actually appears to be doing rather well in regard to his right lower [name] ulcer. He is making good progress although it is slow still I do believe that the Santyl is much better than the Iodoflex. The good news is the patient seems to be tolerating the dressing changes without complication now that we've gotten good of the Iodoflex which really did burn him quite significantly. Otherwise there's no evidence of infection. 04/17/18 on evaluation today  patient actually appears to be showing signs of improvement in regard to the ulcer. Unfortunately he's had somewhat of a rough day due to the fact that he found out that one of his friends suddenly and unexpectedly passed today. He states he's not sure he's in the frame of mind to allow me to debride the wound at this point. Nonetheless I do feel like he is showing signs of the wound getting better little by little each week. 04/24/18 on evaluation today patient's ulcer actually appears to be showing some signs of improvement albeit slow. He has been tolerating the dressing changes without complication except for the wrap which she states was so tight that he had to remove it it was causing a lot of discomfort. Fortunately there is no evidence of infection at this point. He states he only wore the wrap until about the time he got home last week and that he had to take it off. Nonetheless he does not have any other compression stockings, Juxta-Lite wraps, or anything otherwise to really help at this point as far as compression is concerned. 05/01/18 on evaluation today patient actually appears to be doing fairly well in regard to his lower extremity ulcer. He has been tolerating the dressing changes currently without complication. The wrap does seem to be helping as far as the fluid management is concerned. Wound bed itself actually show signs of good improvement although there is some Slough noted there's not as much as previous and he actually has fairly decent granulation noted as well. Overall I'm pleased with the progress of to this point. The patient would prefer not to have any debridement today he states he's actually not feeling too well in general and he really does not want any additional discomfort typically debridement is fairly uncomfortable for him. 05/08/18 on evaluation today patient actually appears  to be doing a little better in regard to his lower extremity ulcer. He has been tolerating  the dressing changes without complication. With that being said I'm actually quite pleased with the fact the wound is not appear to be infected and in general has made a little progress. With that being said I do believe we need to perform some debridement to clean away the necrotic tissue on the surface of the wound. 05/15/18 on evaluation today patient actually appears to be doing a little better in regard to his wound. Fortunately there is some improvement noted fortunately there's also no significant evidence of infection at this point in time. He does have continued pain that really nothing different than previous he did get his Juxta-Lite wrap. 05/29/18 on evaluation today patient actually appears to be doing somewhat better in regard to his wound currently. He's been tolerating the dressing changes without complication. With that being said he has been performing the [facility] Dressing changes every day at home. I'm pretty sure that we told him every other day last week or when I saw him rather two weeks ago. With that being said obviously did not hurt to do it daily just that obviously is gonna run out of supplies sooner and that could get him into trouble as far as his insurance is concerned. Nonetheless he at this point still continues to have pain although honestly I don't feel like it's as bad as what it was in the past just based on his reactions at this point. 06/12/18 on evaluation today patient actually appears to be doing better in regard to his lower [name] ulcer. He's been tolerating the dressing changes without complication. Fortunately there does not appear to be any evidence of infection at this time. No fevers, chills, nausea, or vomiting noted at this time. 06/19/18 evaluation today patient's ulcer on the lower extremity appears to be doing better at this point. he has been tolerating the dressing changes. The patient seems to be somewhat depressed about the progress of his  wound although the overall appearance at this time seems to be doing well. In general I'm very pleased with the appearance today he does have some biofilm on the surface of the wound as well as of hyper granular tissue we may want to utilize a little bit of silver nitrate today. 06/27/2018; patient comes into clinic today for a wound on the right lateral calf likely related to chronic venous insufficiency. He has been using medihoney covered with Hydrofera Blue. Wound surface actually looks quite good 07/03/2018 seen today for follow-up and management of lower extremity ulcer right lateral shin. T olerating dressing changes. He has a small amount of biofilm today. Will attempt to remove a portion of the biofilm as he tolerates. He becomes very anxious with wound treatments. He would benefit from layer compression wraps however he refuses compression wraps or anything that smokes his legs. He expressed an inability to tolerate compression wraps. Juxta lite wraps have been ordered. He has been instructed on the appropriate application of the juxta leg wraps. His blood pressure today on visit was 200/120. He denies any blurred vision, chest pain, dizziness, shortness of breath, or difficulty with mobility. Mr. Ulland stated that he had a recent visit with his primary care provider. At that time his carvedilol was decreased from 25 mg daily to 12.5 mg daily. Strongly encouraged Mr. Abbasi to seek medical attention today during visit. He declined transport for evaluation of elevated blood pressure. Encouraged  him to contact his primary care provider today for medication management. He stated that he would call his primary care doctor after wound visit today. Also encouraged him that if he experienced any symptoms of blurred vision, chest pain, shortness of breath to immediately seek medical attention. 07/17/18 on evaluation today patient's wound actually does seem to show signs of improvement based on the  overall appearance of the wound bed today. There does not appear to show any signs of infection which is good news. There is no overall worsening which is also good news. He still has hyper granular tissue will use in the [employer] followed by [facility] Dressing this point. 07/31/18 on evaluation today patient's wound bed actually show signs of improvement with good epithelialization especially in the upper portion of the wound. He has been tolerating the dressing changes without complication in general. Overall I'm extremely happy with how things stand. No fevers, chills, nausea, or vomiting noted at this time. 08/28/18 on evaluation today patient appears to be doing a little better in regard to his lower extremity ulcer. With that being said it appears to be very hyper granular I think this is directly attributed to the fact that he continues to use the [employer] underneath the [facility] Dressing. Although this helps not to stick unfortunately also think he's not getting the benefit of the [facility] Dressing particular and subsequently is causing too much moisture buildup hence the hyper granulation. Fortunately there does not appear to be any signs of infection at this time which is good news. 09/03/18 on evaluation today patient appears to be doing well in regard to his lower [name] ulcer. He has been tolerating the dressing changes without complication. Fortunately he has much less hyper granular tissue the noted last week I do think using silver nitrate in changing to using the [facility] Dressing without the mepitel has made a big difference for him this is good news. 09/18/18 on evaluation today patient appears to be doing excellent in regard to his right lower [name] ulcer. This is actually significantly smaller even compared to the last evaluation. Overall I'm very pleased in this regard. I'm a recommend that we likely repeat the silver nitrate today based on what I'm  seeing. 10/02/18 on evaluation today patient appears to be doing excellent in regard to his lower extremity wound on the right. He's been tolerating the dressing changes without complication. Fortunately there's no signs of infection at this time. Overall been very pleased with how things seem to be progressing. No fevers, chills, nausea, or vomiting noted at this time. 10/16/18 on evaluation today patient actually appears to be doing much better in regard to his lower extremity wound on the right. This is shown signs of good improvement and is indeed measuring smaller he is on a excellent track as far as healing is concerned. My hope is this will be healed of the next several weeks. Fortunately there is no evidence of infection at this time. He does seem to be doing everything that I'm recommending for him 10/30/18 on evaluation today patient appears to be doing better in regard to his lower extremity ulcer. He's been tolerating the dressing changes without complication. Fortunately the Hydrofera Blue Dressing to be doing the job. He has made excellent progress. With that being said this is still going somewhat slow but nonetheless is always making good progress at this point. 5/18-Patient was in to be seen for right leg ulcer that appeared after scab above it  was removed today. This area had healed completely. 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 The wound has not healed though it has been nearly 2 months since the injury occurred. He was seen in the emergency department at [facility] on May 9. At that time it was very painful and he described it as a "15 on a scale of 010". He also was found to have 3+ pitting edema to the  bilateral lower extremities. He was prescribed a weeks course of Keflex. He is here today because the wound has failed to heal and he has a prior history in our clinic. ABI in clinic today was noncompressible. He did have formal vascular studies performed in 2019 which were normal. The last hemoglobin A1c I have available for review was from March 21, 2021 and was elevated at 7.7%. The wound is fairly small and circular located about 3 inches above his right lateral malleolus. It is completely covered with eschar. No surrounding erythema, no odor, no purulent drainage. 01/11/2022: The wound on his right lateral leg is a little bit smaller today. It has reaccumulated some slough and a small bit of eschar. It remains fairly painful. He has another area on his anterior tibia that he will not let me touch but I am concerned that there is a wound forming underneath the surface. 01/18/2022: Unfortunately, his wound is a little bit bigger today. He continues to accumulate slough on the wound surface. It is still quite tender. 01/25/2022: The patient bumped his wound on the car door which resulted in bleeding. He was seen at urgent care where it was redressed. Unfortunately, the wound is bigger again today. There is accumulated slough on the surface. Urgent care gave him some tramadol, apparently and he took this prior to his visit so he could tolerate debridement better. 02/02/2022: The wound is a little bit smaller today. The surface, however, has accumulated a fairly thick layer of slough. The periwound skin is intact and there is no obvious sign of infection. 02/07/2022: The wound is a bit larger today. It has thick slough accumulation. The periwound skin is intact and there is no obvious erythema, induration, nor any odor. 02/16/2022: I took a culture last week due to the appearance of the wound and the patient's degree of pain. This grew out methicillin sensitive Staph aureus. Augmentin was prescribed. The  patient states that the pain is markedly improved today. The wound also looks much better. It is smaller with less slough buildup and there is good granulation tissue emerging. 02/21/2022: The patient's pain is quite improved from prior visits. The wound is about the same size and has some accumulated slough on the surface. The base is a little bit fibrotic but there are some areas of granulation tissue filling in. 02/28/2022: The wound is a little bit narrower on its measurements but slightly longer. There is still some slough accumulation on the surface, but he has minimal pain and there is no concern for ongoing infection. Granulation tissue is emerging. 03/07/2022: During the week, he cut holes in his wrap because it was painful and too tight. As a result, his leg was quite a bit more swollen today and his wound was larger. The  wound surface is fairly clean but a little bit dry. Minimal slough accumulation. Granulation tissue present. 03/14/2022: Once again, he did some arts and crafts projects with his wrap. On the other hand, however, his wound does look better. It is smaller today and is beginning to fill in. Minimal slough and a small amount of eschar accumulation. 03/27/2022: His wound measured bigger today, although it does not appear to be so on my impression. The wound continues to fill with granulation tissue. There is a little bit of slough and eschar accumulation. He continues to cut his wrap off of his feet and his edema control at the ankle is particularly poor with 3+ pitting edema. His insurance will not cover any sort of skin substitute unfortunately. 04/04/2022: The wound is smaller today. It is more superficial with good granulation tissue and just a little bit of slough. He continues to cut away portions of his wrap which results in inadequate edema control. 04/11/2022: His wound measured a couple millimeters larger today. It continues to fill with granulation tissue and there is  minimal slough on the surface. 04/19/2022: No significant change in the wound dimensions, but the surface is healthier and less fibrotic. 04/26/2022: The wound is a little bit bigger again today. There is slough accumulation on the surface. Edema control is inadequate. He has been cutting the foot portion of his wrap off each week. 05/03/2022: The wound is a little smaller this week. There is still a fair amount of slough on the surface. His drainage was a little bit as greenish today, although not the blue-green typically associated with Pseudomonas. No significant odor. The underlying granulation tissue appears healthy. The culture that I took last week grew out methicillin sensitive Staph aureus. 05/10/2022: The wound is again a little bit smaller this week. He still has slough accumulation on the surface. Most of the surface has fairly decent granulation tissue, but there is still a fibrotic portion in the center near the caudal aspect of the wound. He did not pick up the Augmentin that I prescribed for his MSSA infection. He says he will get it today. 05/19/2022: His wound is smaller by half a centimeter today. There is a little bit of slough buildup, but less so than at previous visits. He has not been taking his Augmentin properly. He has been splitting the tablets in half and taking them on an irregular schedule. He says this is because it has been upsetting his stomach. Electronic Signature(s) Signed: 05/19/2022 9:55:37 AM By: Anthony Maudlin MD FACS Entered By: Anthony Barnes on 05/19/2022 09:55:37 -------------------------------------------------------------------------------- Physical Exam Details Patient Name: Date of Service: Anthony Barnes, Anthony Anthony E. 05/19/2022 9:15 A M Medical Record Number: [id number] Patient Account Number: [id number] Date of Birth/Sex: Treating RN: 01/14/1956 (66 y.o. M) Primary Care Provider: Charlott Barnes Other Clinician: Referring Provider: Treating  Provider/Extender: Anthony Barnes Weeks in Treatment: 42 Constitutional Hypertensive, asymptomatic. . . . No acute distress.Marland Kitchen Respiratory Normal work of breathing on room air.. Notes 05/19/2022: His wound is smaller by half a centimeter today. There is a little bit of slough buildup, but less so than at previous visits. Electronic Signature(s) Signed: 05/19/2022 9:56:03 AM By: Anthony Maudlin MD FACS Entered By: Anthony Barnes on 05/19/2022 09:56:03 -------------------------------------------------------------------------------- Physician Orders Details Patient Name: Date of Service: Anthony Barnes, Anthony Anthony E. 05/19/2022 9:15 A M Medical Record Number: [id number] Patient Account Number: [id number] Date of Birth/Sex: Treating RN: [birth year] (66 y.o. Anthony Barnes Primary Care Provider: Charlott Barnes Other  Clinician: Referring Provider: Treating Provider/Extender: Anthony Barnes Weeks in Treatment: 9 Verbal / Phone Orders: No Diagnosis Coding ICD-10 Coding Code Description [id number] Non-pressure chronic ulcer of other part of right lower leg with fat layer exposed E11.622 Type 2 diabetes mellitus with other skin ulcer E11.9 Type 2 diabetes mellitus without complications [id number] Essential (primary) hypertension I87.2 Venous insufficiency (chronic) (peripheral) Follow-up Appointments ppointment in 1 week. - Dr. Celine Barnes - room 2 Return A Anesthetic Wound #2 Right,Lateral Lower Leg (In clinic) Topical Lidocaine 4% applied to wound bed Bathing/ Shower/ Hygiene May shower with protection but do not get wound dressing(s) wet. - may purchase a cast protector from [employer] or [facility] Edema Control - Lymphedema / SCD / Other Elevate legs to the level of the heart or above for 30 minutes daily and/or when sitting, a frequency of: Avoid standing for long periods of time. Patient to wear own compression stockings every day. Exercise regularly Moisturize  legs daily. Compression stocking or Garment 20-30 mm/Hg pressure to: - to L left until R leg is healed Wound Treatment Wound #2 - Lower Leg Wound Laterality: Right, Lateral Cleanser: Soap and Water 1 x Per Week/30 Days Discharge Instructions: May shower and wash wound with dial antibacterial soap and water prior to dressing change. Cleanser: Wound Cleanser 1 x Per Week/30 Days Discharge Instructions: Cleanse the wound with wound cleanser prior to applying a clean dressing using gauze sponges, not tissue or cotton balls. Peri-Wound Care: Sween Lotion (Moisturizing lotion) 1 x Per Week/30 Days Discharge Instructions: Apply moisturizing lotion as directed Topical: Gentamicin 1 x Per Week/30 Days Discharge Instructions: As directed by physician Prim Dressing: Hydrofera Blue Ready Foam, 4x5 in 1 x Per Week/30 Days ary Discharge Instructions: Apply to wound bed as instructed Secondary Dressing: Woven Gauze Sponge, Non-Sterile 4x4 in 1 x Per Week/30 Days Discharge Instructions: Apply over primary dressing as directed. Secured With: Coban Self-Adherent Wrap 4x5 (in/yd) 1 x Per Week/30 Days Discharge Instructions: Secure with Coban as directed. Secured With: [employer], 4.5x3.1 (in/yd) 1 x Per Week/30 Days Discharge Instructions: Secure with Kerlix as directed. Secured With: 52M Medipore H Soft Cloth Surgical T ape, 4 x 10 (in/yd) 1 x Per Week/30 Days Discharge Instructions: Secure with tape as directed. Patient Medications llergies: Shellfish Containing Products, codeine A Notifications Medication Indication Start End 05/19/2022 lidocaine DOSE topical 5 % ointment - ointment topical Electronic Signature(s) Signed: 05/19/2022 10:08:44 AM By: Anthony Maudlin MD FACS Entered By: Anthony Barnes on 05/19/2022 09:56:15 -------------------------------------------------------------------------------- Problem List Details Patient Name: Date of Service: Anthony Barnes, Anthony Anthony E. 05/19/2022  9:15 A M Medical Record Number: [id number] Patient Account Number: [id number] Date of Birth/Sex: Treating RN: 03/15/1956 (66 y.o. M) Primary Care Provider: Charlott Barnes Other Clinician: Referring Provider: Treating Provider/Extender: Anthony Barnes Weeks in Treatment: 26 Active Problems ICD-10 Encounter Code Description Active Date MDM Diagnosis L97.812 Non-pressure chronic ulcer of other part of right lower leg with fat layer 01/02/2022 No Yes exposed E11.622 Type 2 diabetes mellitus with other skin ulcer 01/02/2022 No Yes E11.9 Type 2 diabetes mellitus without complications [id number] No Yes I10 Essential (primary) hypertension 01/02/2022 No Yes I87.2 Venous insufficiency (chronic) (peripheral) 01/02/2022 No Yes Inactive Problems Resolved Problems Electronic Signature(s) Signed: 05/19/2022 9:53:54 AM By: Anthony Maudlin MD FACS Entered By: Anthony Barnes on 05/19/2022 09:53:54 -------------------------------------------------------------------------------- Progress Note Details Patient Name: Date of Service: Anthony Barnes, Anthony Anthony E. 05/19/2022 9:15 A M Medical Record Number: [id number] Patient Account Number: [id number] Date of Birth/Sex:  Treating RN: 06-16-1956 (66 y.o. M) Primary Care Provider: Charlott Barnes Other Clinician: Referring Provider: Treating Provider/Extender: Anthony Barnes Weeks in Treatment: 27 Subjective Chief Complaint Information obtained from Patient Right LE Ulcer History of Present Illness (HPI) ADMISSION 03/12/18 This is a 66 year old and it was a type II diabetic reasonably poorly controlled with a recent hemoglobin A1c of 8.2. He was tending to his lawn on 01/15/18 when his weed [name] sent a rock up word hitting him in the right anterior leg. He was seen in his primary M.D. office is same time. An x-ray showed no abnormality. He was given a course of cephalexin. Since then he's been applying peroxide and topical  antibiotics to the wound. He does not have a history of chronic wounds however he does have chronic edema in the lower legs. He is complaining of pain in the right leg wound but no real history of claudication. Past medical history includes type 2 diabetes, hypertension, morbid obesity. ABIs in the right leg were noncompressible 03/19/18 on evaluation today patient appears to be tolerating the wrap in general fairly well. He states he's not having that much pain in regard to the wound itself. With that being said he is having some issues with slough buildup on the surface of the wound the Iodoflex does seem to be helpful in that regard. He is still having discomfort he wonders if I can send in a prescription for ibuprofen or something of like to help him out. I do believe that the prox end may be of benefit for him. 03/25/18 on evaluation today patient appears to be doing very well in regard to his right lateral lower [name]. He's been tolerating the dressing changes without complication that is the wraps. Nonetheless he does continue to have pain he states is been dreading the possibility of having to have debridement again today. His first debridement experience was not optimal. With that being said he does seem to be showing signs of improvement there does appear to be little bit more granulation he does have a lot of slough that really does need to breeding away. 04/04/18;the patient went for arterial studies that were really quite normal. ABI on the right at 1.19 with triphasic waveforms on the left 1.11 with triphasic waveforms. TBI 0.93 on the right and 0.95 on the left. This suggests T should be able to tolerate even 4 layer compression. He is however complaining of a lot of pain with our wraps I'm wondering whether the pain is simply Iodoflex. I changed him to [employer]. I'm still going to try to keep him in 3 layer compression 04/12/18 on evaluation today  patient actually appears to be doing rather well in regard to his right lower [name] ulcer. He is making good progress although it is slow still I do believe that the Santyl is much better than the Iodoflex. The good news is the patient seems to be tolerating the dressing changes without complication now that we've gotten good of the Iodoflex which really did burn him quite significantly. Otherwise there's no evidence of infection. 04/17/18 on evaluation today patient actually appears to be showing signs of improvement in regard to the ulcer. Unfortunately he's had somewhat of a rough day due to the fact that he found out that one of his friends suddenly and unexpectedly passed today. He states he's not sure he's in the frame of mind to allow me to debride the wound at this point.  Nonetheless I do feel like he is showing signs of the wound getting better little by little each week. 04/24/18 on evaluation today patient's ulcer actually appears to be showing some signs of improvement albeit slow. He has been tolerating the dressing changes without complication except for the wrap which she states was so tight that he had to remove it it was causing a lot of discomfort. Fortunately there is no evidence of infection at this point. He states he only wore the wrap until about the time he got home last week and that he had to take it off. Nonetheless he does not have any other compression stockings, Juxta-Lite wraps, or anything otherwise to really help at this point as far as compression is concerned. 05/01/18 on evaluation today patient actually appears to be doing fairly well in regard to his lower extremity ulcer. He has been tolerating the dressing changes currently without complication. The wrap does seem to be helping as far as the fluid management is concerned. Wound bed itself actually show signs of good improvement although there is some Slough noted there's not as much as previous and he actually has  fairly decent granulation noted as well. Overall I'm pleased with the progress of to this point. The patient would prefer not to have any debridement today he states he's actually not feeling too well in general and he really does not want any additional discomfort typically debridement is fairly uncomfortable for him. 05/08/18 on evaluation today patient actually appears to be doing a little better in regard to his lower extremity ulcer. He has been tolerating the dressing changes without complication. With that being said I'm actually quite pleased with the fact the wound is not appear to be infected and in general has made a little progress. With that being said I do believe we need to perform some debridement to clean away the necrotic tissue on the surface of the wound. 05/15/18 on evaluation today patient actually appears to be doing a little better in regard to his wound. Fortunately there is some improvement noted fortunately there's also no significant evidence of infection at this point in time. He does have continued pain that really nothing different than previous he did get his Juxta-Lite wrap. 05/29/18 on evaluation today patient actually appears to be doing somewhat better in regard to his wound currently. He's been tolerating the dressing changes without complication. With that being said he has been performing the [facility] Dressing changes every day at home. I'm pretty sure that we told him every other day last week or when I saw him rather two weeks ago. With that being said obviously did not hurt to do it daily just that obviously is gonna run out of supplies sooner and that could get him into trouble as far as his insurance is concerned. Nonetheless he at this point still continues to have pain although honestly I don't feel like it's as bad as what it was in the past just based on his reactions at this point. 06/12/18 on evaluation today patient actually appears to be doing  better in regard to his lower [name] ulcer. He's been tolerating the dressing changes without complication. Fortunately there does not appear to be any evidence of infection at this time. No fevers, chills, nausea, or vomiting noted at this time. 06/19/18 evaluation today patient's ulcer on the lower extremity appears to be doing better at this point. he has been tolerating the dressing changes. The patient seems to be somewhat  depressed about the progress of his wound although the overall appearance at this time seems to be doing well. In general I'm very pleased with the appearance today he does have some biofilm on the surface of the wound as well as of hyper granular tissue we may want to utilize a little bit of silver nitrate today. 06/27/2018; patient comes into clinic today for a wound on the right lateral calf likely related to chronic venous insufficiency. He has been using medihoney covered with Hydrofera Blue. Wound surface actually looks quite good 07/03/2018 seen today for follow-up and management of lower extremity ulcer right lateral shin. Tolerating dressing changes. He has a small amount of biofilm today. Will attempt to remove a portion of the biofilm as he tolerates. He becomes very anxious with wound treatments. He would benefit from layer compression wraps however he refuses compression wraps or anything that smokes his legs. He expressed an inability to tolerate compression wraps. Juxta lite wraps have been ordered. He has been instructed on the appropriate application of the juxta leg wraps. His blood pressure today on visit was 200/120. He denies any blurred vision, chest pain, dizziness, shortness of breath, or difficulty with mobility. Mr. Stavropoulos stated that he had a recent visit with his primary care provider. At that time his carvedilol was decreased from 25 mg daily to 12.5 mg daily. Strongly encouraged Mr. Stelmach to seek medical attention today during visit. He declined  transport for evaluation of elevated blood pressure. Encouraged him to contact his primary care provider today for medication management. He stated that he would call his primary care doctor after wound visit today. Also encouraged him that if he experienced any symptoms of blurred vision, chest pain, shortness of breath to immediately seek medical attention. 07/17/18 on evaluation today patient's wound actually does seem to show signs of improvement based on the overall appearance of the wound bed today. There does not appear to show any signs of infection which is good news. There is no overall worsening which is also good news. He still has hyper granular tissue will use in the [employer] followed by [facility] Dressing this point. 07/31/18 on evaluation today patient's wound bed actually show signs of improvement with good epithelialization especially in the upper portion of the wound. He has been tolerating the dressing changes without complication in general. Overall I'm extremely happy with how things stand. No fevers, chills, nausea, or vomiting noted at this time. 08/28/18 on evaluation today patient appears to be doing a little better in regard to his lower extremity ulcer. With that being said it appears to be very hyper granular I think this is directly attributed to the fact that he continues to use the [employer] underneath the [facility] Dressing. Although this helps not to stick unfortunately also think he's not getting the benefit of the [facility] Dressing particular and subsequently is causing too much moisture buildup hence the hyper granulation. Fortunately there does not appear to be any signs of infection at this time which is good news. 09/03/18 on evaluation today patient appears to be doing well in regard to his lower [name] ulcer. He has been tolerating the dressing changes without complication. Fortunately he has much less hyper granular tissue the noted last week I do  think using silver nitrate in changing to using the [facility] Dressing without the mepitel has made a big difference for him this is good news. 09/18/18 on evaluation today patient appears to be doing excellent in  regard to his right lower [name] ulcer. This is actually significantly smaller even compared to the last evaluation. Overall I'm very pleased in this regard. I'm a recommend that we likely repeat the silver nitrate today based on what I'm seeing. 10/02/18 on evaluation today patient appears to be doing excellent in regard to his lower extremity wound on the right. He's been tolerating the dressing changes without complication. Fortunately there's no signs of infection at this time. Overall been very pleased with how things seem to be progressing. No fevers, chills, nausea, or vomiting noted at this time. 10/16/18 on evaluation today patient actually appears to be doing much better in regard to his lower extremity wound on the right. This is shown signs of good improvement and is indeed measuring smaller he is on a excellent track as far as healing is concerned. My hope is this will be healed of the next several weeks. Fortunately there is no evidence of infection at this time. He does seem to be doing everything that I'm recommending for him 10/30/18 on evaluation today patient appears to be doing better in regard to his lower extremity ulcer. He's been tolerating the dressing changes without complication. Fortunately the Hydrofera Blue Dressing to be doing the job. He has made excellent progress. With that being said this is still going somewhat slow but nonetheless is always making good progress at this point. 5/18-Patient was in to be seen for right leg ulcer that appeared after scab above it was removed today. This area had healed completely. It appears that no further action is required on this READMISSION 01/02/2022 This is a now 66 year old male with poorly controlled type 2 diabetes  mellitus and chronic venous insufficiency who once again was performing lawn care when a rock was flung up by his weedeater and it struck him in the right lateral lower leg. The wound has not healed though it has been nearly 2 months since the injury occurred. He was seen in the emergency department at [facility] on May 9. At that time it was very painful and he described it as a "15 on a scale of 0oo10". He also was found to have 3+ pitting edema to the bilateral lower extremities. He was prescribed a weeks course of Keflex. He is here today because the wound has failed to heal and he has a prior history in our clinic. ABI in clinic today was noncompressible. He did have formal vascular studies performed in 2019 which were normal. The last hemoglobin A1c I have available for review was from March 21, 2021 and was elevated at 7.7%. The wound is fairly small and circular located about 3 inches above his right lateral malleolus. It is completely covered with eschar. No surrounding erythema, no odor, no purulent drainage. 01/11/2022: The wound on his right lateral leg is a little bit smaller today. It has reaccumulated some slough and a small bit of eschar. It remains fairly painful. He has another area on his anterior tibia that he will not let me touch but I am concerned that there is a wound forming underneath the surface. 01/18/2022: Unfortunately, his wound is a little bit bigger today. He continues to accumulate slough on the wound surface. It is still quite tender. 01/25/2022: The patient bumped his wound on the car door which resulted in bleeding. He was seen at urgent care where it was redressed. Unfortunately, the wound is bigger again today. There is accumulated slough on the surface. Urgent care gave him some  tramadol, apparently and he took this prior to his visit so he could tolerate debridement better. 02/02/2022: The wound is a little bit smaller today. The surface, however, has accumulated a  fairly thick layer of slough. The periwound skin is intact and there is no obvious sign of infection. 02/07/2022: The wound is a bit larger today. It has thick slough accumulation. The periwound skin is intact and there is no obvious erythema, induration, nor any odor. 02/16/2022: I took a culture last week due to the appearance of the wound and the patient's degree of pain. This grew out methicillin sensitive Staph aureus. Augmentin was prescribed. The patient states that the pain is markedly improved today. The wound also looks much better. It is smaller with less slough buildup and there is good granulation tissue emerging. 02/21/2022: The patient's pain is quite improved from prior visits. The wound is about the same size and has some accumulated slough on the surface. The base is a little bit fibrotic but there are some areas of granulation tissue filling in. 02/28/2022: The wound is a little bit narrower on its measurements but slightly longer. There is still some slough accumulation on the surface, but he has minimal pain and there is no concern for ongoing infection. Granulation tissue is emerging. 03/07/2022: During the week, he cut holes in his wrap because it was painful and too tight. As a result, his leg was quite a bit more swollen today and his wound was larger. The wound surface is fairly clean but a little bit dry. Minimal slough accumulation. Granulation tissue present. 03/14/2022: Once again, he did some arts and crafts projects with his wrap. On the other hand, however, his wound does look better. It is smaller today and is beginning to fill in. Minimal slough and a small amount of eschar accumulation. 03/27/2022: His wound measured bigger today, although it does not appear to be so on my impression. The wound continues to fill with granulation tissue. There is a little bit of slough and eschar accumulation. He continues to cut his wrap off of his feet and his edema control at the ankle is  particularly poor with 3+ pitting edema. His insurance will not cover any sort of skin substitute unfortunately. 04/04/2022: The wound is smaller today. It is more superficial with good granulation tissue and just a little bit of slough. He continues to cut away portions of his wrap which results in inadequate edema control. 04/11/2022: His wound measured a couple millimeters larger today. It continues to fill with granulation tissue and there is minimal slough on the surface. 04/19/2022: No significant change in the wound dimensions, but the surface is healthier and less fibrotic. 04/26/2022: The wound is a little bit bigger again today. There is slough accumulation on the surface. Edema control is inadequate. He has been cutting the foot portion of his wrap off each week. 05/03/2022: The wound is a little smaller this week. There is still a fair amount of slough on the surface. His drainage was a little bit as greenish today, although not the blue-green typically associated with Pseudomonas. No significant odor. The underlying granulation tissue appears healthy. The culture that I took last week grew out methicillin sensitive Staph aureus. 05/10/2022: The wound is again a little bit smaller this week. He still has slough accumulation on the surface. Most of the surface has fairly decent granulation tissue, but there is still a fibrotic portion in the center near the caudal aspect of the wound. He did  not pick up the Augmentin that I prescribed for his MSSA infection. He says he will get it today. 05/19/2022: His wound is smaller by half a centimeter today. There is a little bit of slough buildup, but less so than at previous visits. He has not been taking his Augmentin properly. He has been splitting the tablets in half and taking them on an irregular schedule. He says this is because it has been upsetting his stomach. Patient History Information obtained from Patient. Family History Cancer - Mother,  Diabetes - Mother,Father, Hypertension - Mother,Father, Kidney Disease - Siblings, Stroke - Father, No family history of Heart Disease, Hereditary Spherocytosis, Lung Disease, Seizures, Thyroid Problems, Tuberculosis. Social History Former smoker - ended on 08/14/1990, Marital Status - Married, Alcohol Use - Moderate, Drug Use - No History, Caffeine Use - Daily. Medical History Eyes Denies history of Cataracts, Glaucoma, Optic Neuritis Ear/Nose/Mouth/Throat Denies history of Chronic sinus problems/congestion, Middle ear problems Hematologic/Lymphatic Denies history of Anemia, Hemophilia, Human Immunodeficiency Virus, Lymphedema, Sickle Cell Disease Respiratory Denies history of Aspiration, Asthma, Chronic Obstructive Pulmonary Disease (COPD), Pneumothorax, Sleep Apnea, Tuberculosis Cardiovascular Patient has history of Hypertension Denies history of Angina, Arrhythmia, Congestive Heart Failure, Coronary Artery Disease, Deep Vein Thrombosis, Hypotension, Myocardial Infarction, Peripheral Arterial Disease, Peripheral Venous Disease, Phlebitis, Vasculitis Gastrointestinal Denies history of Cirrhosis , Colitis, Crohnoos, Hepatitis A, Hepatitis B, Hepatitis C Endocrine Patient has history of Type II Diabetes Genitourinary Denies history of End Stage Renal Disease Immunological Denies history of Lupus Erythematosus, Raynaudoos, Scleroderma Integumentary (Skin) Denies history of History of Burn Musculoskeletal Denies history of Gout, Rheumatoid Arthritis, Osteoarthritis, Osteomyelitis Neurologic Denies history of Dementia, Neuropathy, Quadriplegia, Paraplegia, Seizure Disorder Oncologic Denies history of Received Chemotherapy, Received Radiation Psychiatric Denies history of Anorexia/bulimia, Confinement Anxiety Hospitalization/Surgery History - esophagogastroduodenoscopy. - brain aneurysm surgery. Medical A Surgical History Notes nd Constitutional Symptoms (General  Health) obesity Gastrointestinal diverticulitis Neurologic neuropathy Objective Constitutional Hypertensive, asymptomatic. No acute distress.. Vitals Time Taken: 9:21 AM, Height: 75 in, Weight: 310 lbs, BMI: 38.7, Temperature: 98 F, Pulse: 78 bpm, Respiratory Rate: 18 breaths/min, Blood Pressure: 181/93 mmHg. Respiratory Normal work of breathing on room air.. General Notes: 05/19/2022: His wound is smaller by half a centimeter today. There is a little bit of slough buildup, but less so than at previous visits. Integumentary (Hair, Skin) Wound #2 status is Open. Original cause of wound was Trauma. The date acquired was: 12/03/2021. The wound has been in treatment 19 weeks. The wound is located on the Right,Lateral Lower Leg. The wound measures 4cm length x 1.5cm width x 0.1cm depth; 4.712cm^2 area and 0.471cm^3 volume. There is Fat Layer (Subcutaneous Tissue) exposed. There is no tunneling or undermining noted. There is a medium amount of serosanguineous drainage noted. The wound margin is distinct with the outline attached to the wound base. There is large (67-100%) red granulation within the wound bed. There is a small (1-33%) amount of necrotic tissue within the wound bed including Eschar and Adherent Slough. The periwound skin appearance had no abnormalities noted for texture. The periwound skin appearance had no abnormalities noted for moisture. The periwound skin appearance had no abnormalities noted for color. Periwound temperature was noted as No Abnormality. Assessment Active Problems ICD-10 Non-pressure chronic ulcer of other part of right lower leg with fat layer exposed Type 2 diabetes mellitus with other skin ulcer Type 2 diabetes mellitus without complications Essential (primary) hypertension Venous insufficiency (chronic) (peripheral) Procedures Wound #2 Pre-procedure diagnosis of Wound #2 is a Venous Leg Ulcer located  on the Right,Lateral Lower Leg .Severity of Tissue  Pre Debridement is: Fat layer exposed. There was a Selective/Open Wound Non-Viable Tissue Debridement with a total area of 6 sq cm performed by Anthony Maudlin, MD. With the following instrument(s): Curette to remove Non-Viable tissue/material. Material removed includes [facility] after achieving pain control using Lidocaine 5% topical ointment. No specimens were taken. A time out was conducted at 09:34, prior to the start of the procedure. A Minimum amount of bleeding was controlled with Pressure. The procedure was tolerated well with a pain level of 5 throughout and a pain level of 0 following the procedure. Post Debridement Measurements: 4cm length x 1.5cm width x 0.1cm depth; 0.471cm^3 volume. Character of Wound/Ulcer Post Debridement is improved. Severity of Tissue Post Debridement is: Fat layer exposed. Post procedure Diagnosis Wound #2: Same as Pre-Procedure General Notes: scribed for Dr. Celine Barnes by Anthony Peals, RN. Plan Follow-up Appointments: Return Appointment in 1 week. - Dr. Celine Barnes - room 2 Anesthetic: Wound #2 Right,Lateral Lower Leg: (In clinic) Topical Lidocaine 4% applied to wound bed Bathing/ Shower/ Hygiene: May shower with protection but do not get wound dressing(s) wet. - may purchase a cast protector from [place] or [facility] Edema Control - Lymphedema / SCD / Other: Elevate legs to the level of the heart or above for 30 minutes daily and/or when sitting, a frequency of: Avoid standing for long periods of time. Patient to wear own compression stockings every day. Exercise regularly Moisturize legs daily. Compression stocking or Garment 20-30 mm/Hg pressure to: - to L left until R leg is healed The following medication(s) was prescribed: lidocaine topical 5 % ointment ointment topical was prescribed at facility WOUND #2: - Lower Leg Wound Laterality: Right, Lateral Cleanser: Soap and Water 1 x Per Week/30 Days Discharge Instructions: May shower and wash wound with dial  antibacterial soap and water prior to dressing change. Cleanser: Wound Cleanser 1 x Per Week/30 Days Discharge Instructions: Cleanse the wound with wound cleanser prior to applying a clean dressing using gauze sponges, not tissue or cotton balls. Peri-Wound Care: Sween Lotion (Moisturizing lotion) 1 x Per Week/30 Days Discharge Instructions: Apply moisturizing lotion as directed Topical: Gentamicin 1 x Per Week/30 Days Discharge Instructions: As directed by physician Prim Dressing: Hydrofera Blue Ready Foam, 4x5 in 1 x Per Week/30 Days ary Discharge Instructions: Apply to wound bed as instructed Secondary Dressing: Woven Gauze Sponge, Non-Sterile 4x4 in 1 x Per Week/30 Days Discharge Instructions: Apply over primary dressing as directed. Secured With: Coban Self-Adherent Wrap 4x5 (in/yd) 1 x Per Week/30 Days Discharge Instructions: Secure with Coban as directed. Secured With: [employer], 4.5x3.1 (in/yd) 1 x Per Week/30 Days Discharge Instructions: Secure with Kerlix as directed. Secured With: 65M Medipore H Soft Cloth Surgical T ape, 4 x 10 (in/yd) 1 x Per Week/30 Days Discharge Instructions: Secure with tape as directed. 05/19/2022: His wound is smaller by half a centimeter today. There is a little bit of slough buildup, but less so than at previous visits. I used a curette to debride the slough from his wound. We will continue to apply topical gentamicin with Hydrofera Blue, Kerlix and Coban wrap. I instructed him on the proper administration of his antibiotic. I recommended that he get an over-the-counter probiotic and take the Augmentin with food. He agreed to do so and will complete his course of treatment. Follow-up in 1 week. Electronic Signature(s) Signed: 05/19/2022 9:57:00 AM By: Anthony Maudlin MD FACS Entered By: Anthony Barnes on 05/19/2022 09:57:00 --------------------------------------------------------------------------------  HxROS Details Patient Name: Date of  Service: Anthony Barnes Anthony E. 05/19/2022 9:15 A M Medical Record Number: [id number] Patient Account Number: [id number] Date of Birth/Sex: Treating RN: 1956-06-26 (66 y.o. M) Primary Care Provider: Charlott Barnes Other Clinician: Referring Provider: Treating Provider/Extender: Anthony Barnes Weeks in Treatment: 63 Information Obtained From Patient Constitutional Symptoms (General Health) Medical History: Past Medical History Notes: obesity Eyes Medical History: Negative for: Cataracts; Glaucoma; Optic Neuritis Ear/Nose/Mouth/Throat Medical History: Negative for: Chronic sinus problems/congestion; Middle ear problems Hematologic/Lymphatic Medical History: Negative for: Anemia; Hemophilia; Human Immunodeficiency Virus; Lymphedema; Sickle Cell Disease Respiratory Medical History: Negative for: Aspiration; Asthma; Chronic Obstructive Pulmonary Disease (COPD); Pneumothorax; Sleep Apnea; Tuberculosis Cardiovascular Medical History: Positive for: Hypertension Negative for: Angina; Arrhythmia; Congestive Heart Failure; Coronary Artery Disease; Deep Vein Thrombosis; Hypotension; Myocardial Infarction; Peripheral Arterial Disease; Peripheral Venous Disease; Phlebitis; Vasculitis Gastrointestinal Medical History: Negative for: Cirrhosis ; Colitis; Crohns; Hepatitis A; Hepatitis B; Hepatitis C Past Medical History Notes: diverticulitis Endocrine Medical History: Positive for: Type II Diabetes Time with diabetes: 8 years Treated with: Oral agents Blood sugar tested every day: No Genitourinary Medical History: Negative for: End Stage Renal Disease Immunological Medical History: Negative for: Lupus Erythematosus; Raynauds; Scleroderma Integumentary (Skin) Medical History: Negative for: History of Burn Musculoskeletal Medical History: Negative for: Gout; Rheumatoid Arthritis; Osteoarthritis; Osteomyelitis Neurologic Medical History: Negative for: Dementia;  Neuropathy; Quadriplegia; Paraplegia; Seizure Disorder Past Medical History Notes: neuropathy Oncologic Medical History: Negative for: Received Chemotherapy; Received Radiation Psychiatric Medical History: Negative for: Anorexia/bulimia; Confinement Anxiety Immunizations Pneumococcal Vaccine: Received Pneumococcal Vaccination: No Immunization Notes: tetanus 2 years ago Implantable Devices None Hospitalization / Surgery History Type of Hospitalization/Surgery esophagogastroduodenoscopy brain aneurysm surgery Family and Social History Cancer: Yes - Mother; Diabetes: Yes - Mother,Father; Heart Disease: No; Hereditary Spherocytosis: No; Hypertension: Yes - Mother,Father; Kidney Disease: Yes - Siblings; Lung Disease: No; Seizures: No; Stroke: Yes - Father; Thyroid Problems: No; Tuberculosis: No; Former smoker - ended on 08/14/1990; Marital Status - Married; Alcohol Use: Moderate; Drug Use: No History; Caffeine Use: Daily; Financial Concerns: No; Food, Clothing or Shelter Needs: No; Support System Lacking: No; Transportation Concerns: No Electronic Signature(s) Signed: 05/19/2022 10:08:44 AM By: Anthony Maudlin MD FACS Entered By: Anthony Barnes on 05/19/2022 09:55:43 -------------------------------------------------------------------------------- SuperBill Details Patient Name: Date of Service: Anthony Barnes, Anthony Anthony E. 05/19/2022 Medical Record Number: [id number] Patient Account Number: [id number] Date of Birth/Sex: Treating RN: 1956/03/03 (66 y.o. M) Primary Care Provider: Charlott Barnes Other Clinician: Referring Provider: Treating Provider/Extender: Anthony Barnes Weeks in Treatment: 19 Diagnosis Coding ICD-10 Codes Code Description [phone] Non-pressure chronic ulcer of other part of right lower leg with fat layer exposed E11.622 Type 2 diabetes mellitus with other skin ulcer E11.9 Type 2 diabetes mellitus without complications [id number] Essential (primary)  hypertension I87.2 Venous insufficiency (chronic) (peripheral) Facility Procedures CPT4 Code: [id number] Description: [phone] - DEBRIDE WOUND 1ST 20 SQ CM OR < ICD-10 Diagnosis Description 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os Modifier: ed Quantity: 1 Physician Procedures : CPT4 Code Description Modifier [id number] - WC PHYS LEVEL 4 - EST PT ICD-10 Diagnosis Description 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osed E11.622 Type 2 diabetes mellitus with other skin ulcer I87.2  Venous insufficiency (chronic) (peripheral) I10 Essential (primary) hypertension Quantity: 1 : [id number] - WC PHYS DEBR WO ANESTH 20 SQ CM ICD-10 Diagnosis Description L97.812 Non-pressure chronic ulcer of other part of right lower  leg with fat layer exposed Quantity: 1 Electronic Signature(s) Signed: 05/19/2022 9:57:18 AM By: Anthony Maudlin MD FACS Entered By: Anthony Barnes on 05/19/2022 09:57:18

## 2022-05-25 ENCOUNTER — Encounter (HOSPITAL_BASED_OUTPATIENT_CLINIC_OR_DEPARTMENT_OTHER): Payer: Medicare Other | Admitting: General Surgery

## 2022-05-25 DIAGNOSIS — L97812 Non-pressure chronic ulcer of other part of right lower leg with fat layer exposed: Secondary | ICD-10-CM | POA: Diagnosis not present

## 2022-05-25 DIAGNOSIS — E11622 Type 2 diabetes mellitus with other skin ulcer: Secondary | ICD-10-CM | POA: Diagnosis not present

## 2022-05-25 DIAGNOSIS — Z6838 Body mass index (BMI) 38.0-38.9, adult: Secondary | ICD-10-CM | POA: Diagnosis not present

## 2022-05-25 DIAGNOSIS — I872 Venous insufficiency (chronic) (peripheral): Secondary | ICD-10-CM | POA: Diagnosis not present

## 2022-05-25 DIAGNOSIS — I10 Essential (primary) hypertension: Secondary | ICD-10-CM | POA: Diagnosis not present

## 2022-05-25 NOTE — Progress Notes (Addendum)
Anthony Barnes ([id number]) [id number] Page 1 of 13 Visit Report for 05/25/2022 Chief Complaint Document Details Patient Name: Date of Service: Anthony Barnes [name] 05/25/2022 8:30 A M Medical Record Number: [id number] Patient Account Number: [id number] Date of Birth/Sex: Treating RN: [birth year] (66 y.o. [name] Primary Care Provider: Hoy Register Other Clinician: Referring Provider: Treating Provider/Extender: Camillo Flaming Weeks in Treatment: 20 Information Obtained from: Patient Chief Complaint Right LE Ulcer Electronic Signature(s) Signed: 05/25/2022 9:22:33 AM By: Duanne Guess MD FACS Entered By: Duanne Guess on 05/25/2022 09:22:33 -------------------------------------------------------------------------------- Debridement Details Patient Name: Date of Service: Anthony Barnes, Anthony [name] 05/25/2022 8:30 A M Medical Record Number: [id number] Patient Account Number: [id number] Date of Birth/Sex: Treating RN: 1956/02/19 (66 y.o. [name] Primary Care Provider: Hoy Register Other Clinician: Referring Provider: Treating Provider/Extender: Camillo Flaming Weeks in Treatment: 20 Debridement Performed for Assessment: Wound #2 Right,Lateral Lower Leg Performed By: Physician Duanne Guess, MD Debridement Type: Debridement Severity of Tissue Pre Debridement: Fat layer exposed Level of Consciousness (Pre-procedure): Awake and Alert Pre-procedure Verification/Time Out Yes - 08:50 Taken: Start Time: 08:50 Pain Control: Lidocaine 5% topical ointment T Area Debrided (L x W): otal 3.9 (cm) x 1.4 (cm) = 5.46 (cm) Tissue and other material debrided: Non-Viable, Biofilm Level: Non-Viable Tissue Debridement Description: Selective/Open Wound Instrument: Curette Bleeding: Minimum Hemostasis Achieved: Pressure Procedural Pain: 6 Post Procedural Pain: 1 Response to Treatment:  Procedure was tolerated well Level of Consciousness (Post- Awake and Alert procedure): Post Debridement Measurements of Total Wound Length: (cm) 3.9 Width: (cm) 1.4 Depth: (cm) 0.1 Volume: (cm) 0.429 Character of Wound/Ulcer Post Debridement: Improved Severity of Tissue Post Debridement: Fat layer exposed Anthony Barnes ([id number]) [id number] Page 2 of 13 Post Procedure Diagnosis Same as Pre-procedure Notes scribed for Dr. Lady Gary by Samuella Bruin, RN Electronic Signature(s) Signed: 05/25/2022 10:08:47 AM By: Duanne Guess MD FACS Signed: 05/25/2022 4:09:44 PM By: Samuella Bruin Entered By: Samuella Bruin on 05/25/2022 10:01:07 -------------------------------------------------------------------------------- HPI Details Patient Name: Date of Service: Anthony Barnes, Anthony [name] 05/25/2022 8:30 A M Medical Record Number: [id number] Patient Account Number: [id number] Date of Birth/Sex: Treating RN: [birth year] (66 y.o. [name] Primary Care Provider: Hoy Register Other Clinician: Referring Provider: Treating Provider/Extender: Camillo Flaming Weeks in Treatment: 20 History of Present Illness HPI Description: ADMISSION 03/12/18 This is a 66 year old and it was a type II diabetic reasonably poorly controlled with a recent hemoglobin A1c of 8.2. He was tending to his lawn on 01/15/18 when his weed Anthony Barnes sent a rock up word hitting him in the right anterior leg. He was seen in his primary M.D. office is same time. An x-ray showed no abnormality. He was given a course of cephalexin. Since then he's been applying peroxide and topical antibiotics to the wound. He does not have a history of chronic wounds however he does have chronic edema in the lower legs. He is complaining of pain in the right leg wound but no real history of claudication. Past medical history includes type 2 diabetes, hypertension, morbid  obesity. ABIs in the right leg were noncompressible 03/19/18 on evaluation today patient appears to be tolerating the wrap in general fairly well. He states he's not having that much pain in regard to the wound itself. With that being said he is having some issues with slough buildup on the surface of the wound the Iodoflex does seem to be helpful in that regard. He is still having discomfort he wonders  if I can send in a prescription for ibuprofen or something of like to help him out. I do believe that the prox end may be of benefit for him. 03/25/18 on evaluation today patient appears to be doing very well in regard to his right lateral lower [name]. He's been tolerating the dressing changes without complication that is the wraps. Nonetheless he does continue to have pain he states is been dreading the possibility of having to have debridement again today. His first debridement experience was not optimal. With that being said he does seem to be showing signs of improvement there does appear to be little bit more granulation he does have a lot of slough that really does need to breeding away. 04/04/18;the patient went for arterial studies that were really quite normal. ABI on the right at 1.19 with triphasic waveforms on the left 1.11 with triphasic waveforms. TBI 0.93 on the right and 0.95 on the left. This suggests T should be able to tolerate even 4 layer compression. He is however complaining of a lot of pain with our wraps I'm wondering whether the pain is simply Iodoflex. I changed him to [employer] I'm still going to try to keep him in 3 layer compression 04/12/18 on evaluation today patient actually appears to be doing rather well in regard to his right lower [name] ulcer. He is making good progress although it is slow still I do believe that the Santyl is much better than the Iodoflex. The good news is the patient seems to be tolerating the dressing  changes without complication now that we've gotten good of the Iodoflex which really did burn him quite significantly. Otherwise there's no evidence of infection. 04/17/18 on evaluation today patient actually appears to be showing signs of improvement in regard to the ulcer. Unfortunately he's had somewhat of a rough day due to the fact that he found out that one of his friends suddenly and unexpectedly passed today. He states he's not sure he's in the frame of mind to allow me to debride the wound at this point. Nonetheless I do feel like he is showing signs of the wound getting better little by little each week. 04/24/18 on evaluation today patient's ulcer actually appears to be showing some signs of improvement albeit slow. He has been tolerating the dressing changes without complication except for the wrap which she states was so tight that he had to remove it it was causing a lot of discomfort. Fortunately there is no evidence of infection at this point. He states he only wore the wrap until about the time he got home last week and that he had to take it off. Nonetheless he does not have any other compression stockings, Juxta-Lite wraps, or anything otherwise to really help at this point as far as compression is concerned. 05/01/18 on evaluation today patient actually appears to be doing fairly well in regard to his lower extremity ulcer. He has been tolerating the dressing changes currently without complication. The wrap does seem to be helping as far as the fluid management is concerned. Wound bed itself actually show signs of good improvement although there is some Slough noted there's not as much as previous and he actually has fairly decent granulation noted as well. Overall I'm pleased with the progress of to this point. The patient would prefer not to have any debridement today he states he's actually not feeling too well in general and he really does not want  any additional discomfort typically  debridement is fairly uncomfortable for him. 05/08/18 on evaluation today patient actually appears to be doing a little better in regard to his lower extremity ulcer. He has been tolerating the dressing changes without complication. With that being said I'm actually quite pleased with the fact the wound is not appear to be infected and in general has made a little progress. With that being said I do believe we need to perform some debridement to clean away the necrotic tissue on the surface of the wound. 05/15/18 on evaluation today patient actually appears to be doing a little better in regard to his wound. Fortunately there is some improvement noted fortunately there's also no significant evidence of infection at this point in time. He does have continued pain that really nothing different than previous he did get his Juxta-Lite wrap. Anthony Barnes, Anthony Barnes ([id number]) [id number] Page 3 of 13 05/29/18 on evaluation today patient actually appears to be doing somewhat better in regard to his wound currently. He's been tolerating the dressing changes without complication. With that being said he has been performing the [facility] Dressing changes every day at home. I'm pretty sure that we told him every other day last week or when I saw him rather two weeks ago. With that being said obviously did not hurt to do it daily just that obviously is gonna run out of supplies sooner and that could get him into trouble as far as his insurance is concerned. Nonetheless he at this point still continues to have pain although honestly I don't feel like it's as bad as what it was in the past just based on his reactions at this point. 06/12/18 on evaluation today patient actually appears to be doing better in regard to his lower [name] ulcer. He's been tolerating the dressing changes without complication. Fortunately there does not appear to be any evidence of infection at this time. No  fevers, chills, nausea, or vomiting noted at this time. 06/19/18 evaluation today patient's ulcer on the lower extremity appears to be doing better at this point. he has been tolerating the dressing changes. The patient seems to be somewhat depressed about the progress of his wound although the overall appearance at this time seems to be doing well. In general I'm very pleased with the appearance today he does have some biofilm on the surface of the wound as well as of hyper granular tissue we may want to utilize a little bit of silver nitrate today. 06/27/2018; patient comes into clinic today for a wound on the right lateral calf likely related to chronic venous insufficiency. He has been using medihoney covered with Hydrofera Blue. Wound surface actually looks quite good 07/03/2018 seen today for follow-up and management of lower extremity ulcer right lateral shin. T olerating dressing changes. He has a small amount of biofilm today. Will attempt to remove a portion of the biofilm as he tolerates. He becomes very anxious with wound treatments. He would benefit from layer compression wraps however he refuses compression wraps or anything that smokes his legs. He expressed an inability to tolerate compression wraps. Juxta lite wraps have been ordered. He has been instructed on the appropriate application of the juxta leg wraps. His blood pressure today on visit was 200/120. He denies any blurred vision, chest pain, dizziness, shortness of breath, or difficulty with mobility. Mr. Moist stated that he had a recent visit with his primary care provider. At that time his carvedilol was decreased from 25 mg  daily to 12.5 mg daily. Strongly encouraged Mr. Shankles to seek medical attention today during visit. He declined transport for evaluation of elevated blood pressure. Encouraged him to contact his primary care provider today for medication management. He stated that he would call his primary care doctor  after wound visit today. Also encouraged him that if he experienced any symptoms of blurred vision, chest pain, shortness of breath to immediately seek medical attention. 07/17/18 on evaluation today patient's wound actually does seem to show signs of improvement based on the overall appearance of the wound bed today. There does not appear to show any signs of infection which is good news. There is no overall worsening which is also good news. He still has hyper granular tissue will use in the [employer] followed by [facility] Dressing this point. 07/31/18 on evaluation today patient's wound bed actually show signs of improvement with good epithelialization especially in the upper portion of the wound. He has been tolerating the dressing changes without complication in general. Overall I'm extremely happy with how things stand. No fevers, chills, nausea, or vomiting noted at this time. 08/28/18 on evaluation today patient appears to be doing a little better in regard to his lower extremity ulcer. With that being said it appears to be very hyper granular I think this is directly attributed to the fact that he continues to use the [employer] underneath the [facility] Dressing. Although this helps not to stick unfortunately also think he's not getting the benefit of the [facility] Dressing particular and subsequently is causing too much moisture buildup hence the hyper granulation. Fortunately there does not appear to be any signs of infection at this time which is good news. 09/03/18 on evaluation today patient appears to be doing well in regard to his lower [name] ulcer. He has been tolerating the dressing changes without complication. Fortunately he has much less hyper granular tissue the noted last week I do think using silver nitrate in changing to using the [facility] Dressing without the mepitel has made a big difference for him this is good news. 09/18/18 on evaluation today patient appears  to be doing excellent in regard to his right lower [name] ulcer. This is actually significantly smaller even compared to the last evaluation. Overall I'm very pleased in this regard. I'm a recommend that we likely repeat the silver nitrate today based on what I'm seeing. 10/02/18 on evaluation today patient appears to be doing excellent in regard to his lower extremity wound on the right. He's been tolerating the dressing changes without complication. Fortunately there's no signs of infection at this time. Overall been very pleased with how things seem to be progressing. No fevers, chills, nausea, or vomiting noted at this time. 10/16/18 on evaluation today patient actually appears to be doing much better in regard to his lower extremity wound on the right. This is shown signs of good improvement and is indeed measuring smaller he is on a excellent track as far as healing is concerned. My hope is this will be healed of the next several weeks. Fortunately there is no evidence of infection at this time. He does seem to be doing everything that I'm recommending for him 10/30/18 on evaluation today patient appears to be doing better in regard to his lower extremity ulcer. He's been tolerating the dressing changes without complication. Fortunately the Hydrofera Blue Dressing to be doing the job. He has made excellent progress. With that being said this is still going somewhat slow  but nonetheless is always making good progress at this point. 5/18-Patient was in to be seen for right leg ulcer that appeared after scab above it was removed today. This area had healed completely. 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 The wound has not  healed though it has been nearly 2 months since the injury occurred. He was seen in the emergency department at [facility] on May 9. At that time it was very painful and he described it as a "15 on a scale of 010". He also was found to have 3+ pitting edema to the bilateral lower extremities. He was prescribed a weeks course of Keflex. He is here today because the wound has failed to heal and he has a prior history in our clinic. ABI in clinic today was noncompressible. He did have formal vascular studies performed in 2019 which were normal. The last hemoglobin A1c I have available for review was from March 21, 2021 and was elevated at 7.7%. The wound is fairly small and circular located about 3 inches above his right lateral malleolus. It is completely covered with eschar. No surrounding erythema, no odor, no purulent drainage. 01/11/2022: The wound on his right lateral leg is a little bit smaller today. It has reaccumulated some slough and a small bit of eschar. It remains fairly painful. He has another area on his anterior tibia that he will not let me touch but I am concerned that there is a wound forming underneath the surface. 01/18/2022: Unfortunately, his wound is a little bit bigger today. He continues to accumulate slough on the wound surface. It is still quite tender. 01/25/2022: The patient bumped his wound on the car door which resulted in bleeding. He was seen at urgent care where it was redressed. Unfortunately, the wound is bigger again today. There is accumulated slough on the surface. Urgent care gave him some tramadol, apparently and he took this prior to his visit so he could tolerate debridement better. 02/02/2022: The wound is a little bit smaller today. The surface, however, has accumulated a fairly thick layer of slough. The periwound skin is intact and there is no obvious sign of infection. 02/07/2022: The wound is a bit larger today. It has thick slough accumulation. The periwound  skin is intact and there is no obvious erythema, induration, nor any odor. 02/16/2022: I took a culture last week due to the appearance of the wound and the patient's degree of pain. This grew out methicillin sensitive Staph aureus. Augmentin was prescribed. The patient states that the pain is markedly improved today. The wound also looks much better. It is smaller with less slough Anthony Barnes, Anthony Barnes ([id number]) [id number] Page 4 of 13 buildup and there is good granulation tissue emerging. 02/21/2022: The patient's pain is quite improved from prior visits. The wound is about the same size and has some accumulated slough on the surface. The base is a little bit fibrotic but there are some areas of granulation tissue filling in. 02/28/2022: The wound is a little bit narrower on its measurements but slightly longer. There is still some slough accumulation on the surface, but he has minimal pain and there is no concern for ongoing infection. Granulation tissue is emerging.  03/07/2022: During the week, he cut holes in his wrap because it was painful and too tight. As a result, his leg was quite a bit more swollen today and his wound was larger. The wound surface is fairly clean but a little bit dry. Minimal slough accumulation. Granulation tissue present. 03/14/2022: Once again, he did some arts and crafts projects with his wrap. On the other hand, however, his wound does look better. It is smaller today and is beginning to fill in. Minimal slough and a small amount of eschar accumulation. 03/27/2022: His wound measured bigger today, although it does not appear to be so on my impression. The wound continues to fill with granulation tissue. There is a little bit of slough and eschar accumulation. He continues to cut his wrap off of his feet and his edema control at the ankle is particularly poor with 3+ pitting edema. His insurance will not cover any sort of skin substitute  unfortunately. 04/04/2022: The wound is smaller today. It is more superficial with good granulation tissue and just a little bit of slough. He continues to cut away portions of his wrap which results in inadequate edema control. 04/11/2022: His wound measured a couple millimeters larger today. It continues to fill with granulation tissue and there is minimal slough on the surface. 04/19/2022: No significant change in the wound dimensions, but the surface is healthier and less fibrotic. 04/26/2022: The wound is a little bit bigger again today. There is slough accumulation on the surface. Edema control is inadequate. He has been cutting the foot portion of his wrap off each week. 05/03/2022: The wound is a little smaller this week. There is still a fair amount of slough on the surface. His drainage was a little bit as greenish today, although not the blue-green typically associated with Pseudomonas. No significant odor. The underlying granulation tissue appears healthy. The culture that I took last week grew out methicillin sensitive Staph aureus. 05/10/2022: The wound is again a little bit smaller this week. He still has slough accumulation on the surface. Most of the surface has fairly decent granulation tissue, but there is still a fibrotic portion in the center near the caudal aspect of the wound. He did not pick up the Augmentin that I prescribed for his MSSA infection. He says he will get it today. 05/19/2022: His wound is smaller by half a centimeter today. There is a little bit of slough buildup, but less so than at previous visits. He has not been taking his Augmentin properly. He has been splitting the tablets in half and taking them on an irregular schedule. He says this is because it has been upsetting his stomach. 05/25/2022: His wound is a little bit smaller again this week. Very minimal slough accumulation. The wound is drier than I would like to see. He completed his course of  Augmentin. Electronic Signature(s) Signed: 05/25/2022 9:23:15 AM By: Duanne Guess MD FACS Entered By: Duanne Guess on 05/25/2022 09:23:15 -------------------------------------------------------------------------------- Physical Exam Details Patient Name: Date of Service: Anthony Barnes, Anthony [name] 05/25/2022 8:30 A M Medical Record Number: [id number] Patient Account Number: [id number] Date of Birth/Sex: Treating RN: 03-13-1956 (66 y.o. [name] Primary Care Provider: Hoy Register Other Clinician: Referring Provider: Treating Provider/Extender: Camillo Flaming Weeks in Treatment: 20 Constitutional Hypertensive, asymptomatic. . . . No acute distress.Marland Kitchen Respiratory Normal work of breathing on room air.. Notes 05/25/2022: His wound is a little bit smaller again this week. Very minimal slough accumulation. The wound is  drier than I would like to see. Electronic Signature(s) Signed: 05/25/2022 9:23:45 AM By: Duanne Guess MD FACS Entered By: Duanne Guess on 05/25/2022 09:23:45 Anthony Barnes ([id number]) [id number] Page 5 of 13 -------------------------------------------------------------------------------- Physician Orders Details Patient Name: Date of Service: Anthony Barnes [name] 05/25/2022 8:30 A M Medical Record Number: [id number] Patient Account Number: [id number] Date of Birth/Sex: Treating RN: September 08, 1955 (66 y.o. [name] Primary Care Provider: Hoy Register Other Clinician: Referring Provider: Treating Provider/Extender: Camillo Flaming Weeks in Treatment: 20 Verbal / Phone Orders: No Diagnosis Coding ICD-10 Coding Code Description [phone] Non-pressure chronic ulcer of other part of right lower leg with fat layer exposed E11.622 Type 2 diabetes mellitus with other skin ulcer E11.9 Type 2 diabetes mellitus without complications I10 Essential (primary)  hypertension I87.2 Venous insufficiency (chronic) (peripheral) Follow-up Appointments ppointment in 1 week. - Dr. Lady Gary - room 2 Return A Anesthetic Wound #2 Right,Lateral Lower Leg (In clinic) Topical Lidocaine 5% applied to wound bed Bathing/ Shower/ Hygiene May shower with protection but do not get wound dressing(s) wet. - may purchase a cast protector from [facility] or [facility] Edema Control - Lymphedema / SCD / Other Elevate legs to the level of the heart or above for 30 minutes daily and/or when sitting, a frequency of: Avoid standing for long periods of time. Patient to wear own compression stockings every day. Exercise regularly Moisturize legs daily. Compression stocking or Garment 20-30 mm/Hg pressure to: - to L left until R leg is healed Wound Treatment Wound #2 - Lower Leg Wound Laterality: Right, Lateral Cleanser: Soap and Water 1 x Per Week/30 Days Discharge Instructions: May shower and wash wound with dial antibacterial soap and water prior to dressing change. Cleanser: Wound Cleanser 1 x Per Week/30 Days Discharge Instructions: Cleanse the wound with wound cleanser prior to applying a clean dressing using gauze sponges, not tissue or cotton balls. Peri-Wound Care: Sween Lotion (Moisturizing lotion) 1 x Per Week/30 Days Discharge Instructions: Apply moisturizing lotion as directed Topical: Skintegrity Hydrogel 4 (oz) 1 x Per Week/30 Days Discharge Instructions: Apply hydrogel as directed Prim Dressing: Hydrofera Blue Ready Foam, 4x5 in 1 x Per Week/30 Days ary Discharge Instructions: Apply to wound bed as instructed Secondary Dressing: Woven Gauze Sponge, Non-Sterile 4x4 in 1 x Per Week/30 Days Discharge Instructions: Apply over primary dressing as directed. Secured With: Coban Self-Adherent Wrap 4x5 (in/yd) 1 x Per Week/30 Days Discharge Instructions: Secure with Coban as directed. Secured With: [employer], 4.5x3.1 (in/yd) 1 x Per Week/30 Days Discharge  Instructions: Secure with Kerlix as directed. Secured With: 82M Medipore H Soft Cloth Surgical T ape, 4 x 10 (in/yd) 1 x Per Week/30 Days Discharge Instructions: Secure with tape as directed. Anthony Barnes, Anthony Barnes ([id number]) [id number] Page 6 of 13 Patient Medications llergies: Shellfish Containing Products, codeine A Notifications Medication Indication Start End 05/25/2022 lidocaine DOSE topical 5 % ointment - ointment topical Electronic Signature(s) Signed: 05/25/2022 10:08:47 AM By: Duanne Guess MD FACS Entered By: Duanne Guess on 05/25/2022 09:23:56 -------------------------------------------------------------------------------- Problem List Details Patient Name: Date of Service: Anthony Barnes, Anthony [name] 05/25/2022 8:30 A M Medical Record Number: [id number] Patient Account Number: [id number] Date of Birth/Sex: Treating RN: 11/25/1955 (66 y.o. [name] Primary Care Provider: Hoy Register Other Clinician: Referring Provider: Treating Provider/Extender: Camillo Flaming Weeks in Treatment: 20 Active Problems ICD-10 Encounter Code Description Active Date MDM Diagnosis L97.812 Non-pressure chronic ulcer of other part of right lower leg with fat layer 01/02/2022  No Yes exposed E11.622 Type 2 diabetes mellitus with other skin ulcer 01/02/2022 No Yes E11.9 Type 2 diabetes mellitus without complications 01/02/2022 No Yes I10 Essential (primary) hypertension 01/02/2022 No Yes I87.2 Venous insufficiency (chronic) (peripheral) 01/02/2022 No Yes Inactive Problems Resolved Problems Electronic Signature(s) Signed: 05/25/2022 9:21:10 AM By: Duanne Guess MD FACS Entered By: Duanne Guess on 05/25/2022 09:21:10 Anthony Barnes ([id number]) [id number] Page 7 of 13 -------------------------------------------------------------------------------- Progress Note Details Patient Name: Date of  Service: Anthony Barnes [name] 05/25/2022 8:30 A M Medical Record Number: [id number] Patient Account Number: [id number] Date of Birth/Sex: Treating RN: Nov 26, 1955 (66 y.o. [name] Primary Care Provider: Hoy Register Other Clinician: Referring Provider: Treating Provider/Extender: Camillo Flaming Weeks in Treatment: 20 Subjective Chief Complaint Information obtained from Patient Right LE Ulcer History of Present Illness (HPI) ADMISSION 03/12/18 This is a 66 year old and it was a type II diabetic reasonably poorly controlled with a recent hemoglobin A1c of 8.2. He was tending to his lawn on 01/15/18 when his weed Anthony Barnes sent a rock up word hitting him in the right anterior leg. He was seen in his primary M.D. office is same time. An x-ray showed no abnormality. He was given a course of cephalexin. Since then he's been applying peroxide and topical antibiotics to the wound. He does not have a history of chronic wounds however he does have chronic edema in the lower legs. He is complaining of pain in the right leg wound but no real history of claudication. Past medical history includes type 2 diabetes, hypertension, morbid obesity. ABIs in the right leg were noncompressible 03/19/18 on evaluation today patient appears to be tolerating the wrap in general fairly well. He states he's not having that much pain in regard to the wound itself. With that being said he is having some issues with slough buildup on the surface of the wound the Iodoflex does seem to be helpful in that regard. He is still having discomfort he wonders if I can send in a prescription for ibuprofen or something of like to help him out. I do believe that the prox end may be of benefit for him. 03/25/18 on evaluation today patient appears to be doing very well in regard to his right lateral lower [name]. He's been tolerating the dressing changes without complication that is the  wraps. Nonetheless he does continue to have pain he states is been dreading the possibility of having to have debridement again today. His first debridement experience was not optimal. With that being said he does seem to be showing signs of improvement there does appear to be little bit more granulation he does have a lot of slough that really does need to breeding away. 04/04/18;the patient went for arterial studies that were really quite normal. ABI on the right at 1.19 with triphasic waveforms on the left 1.11 with triphasic waveforms. TBI 0.93 on the right and 0.95 on the left. This suggests T should be able to tolerate even 4 layer compression. He is however complaining of a lot of pain with our wraps I'm wondering whether the pain is simply Iodoflex. I changed him to [employer] I'm still going to try to keep him in 3 layer compression 04/12/18 on evaluation today patient actually appears to be doing rather well in regard to his right lower [name] ulcer. He is making good progress although it is slow still I do believe that the Santyl is much better than the  Iodoflex. The good news is the patient seems to be tolerating the dressing changes without complication now that we've gotten good of the Iodoflex which really did burn him quite significantly. Otherwise there's no evidence of infection. 04/17/18 on evaluation today patient actually appears to be showing signs of improvement in regard to the ulcer. Unfortunately he's had somewhat of a rough day due to the fact that he found out that one of his friends suddenly and unexpectedly passed today. He states he's not sure he's in the frame of mind to allow me to debride the wound at this point. Nonetheless I do feel like he is showing signs of the wound getting better little by little each week. 04/24/18 on evaluation today patient's ulcer actually appears to be showing some signs of improvement albeit slow. He has been tolerating the  dressing changes without complication except for the wrap which she states was so tight that he had to remove it it was causing a lot of discomfort. Fortunately there is no evidence of infection at this point. He states he only wore the wrap until about the time he got home last week and that he had to take it off. Nonetheless he does not have any other compression stockings, Juxta-Lite wraps, or anything otherwise to really help at this point as far as compression is concerned. 05/01/18 on evaluation today patient actually appears to be doing fairly well in regard to his lower extremity ulcer. He has been tolerating the dressing changes currently without complication. The wrap does seem to be helping as far as the fluid management is concerned. Wound bed itself actually show signs of good improvement although there is some Slough noted there's not as much as previous and he actually has fairly decent granulation noted as well. Overall I'm pleased with the progress of to this point. The patient would prefer not to have any debridement today he states he's actually not feeling too well in general and he really does not want any additional discomfort typically debridement is fairly uncomfortable for him. 05/08/18 on evaluation today patient actually appears to be doing a little better in regard to his lower extremity ulcer. He has been tolerating the dressing changes without complication. With that being said I'm actually quite pleased with the fact the wound is not appear to be infected and in general has made a little progress. With that being said I do believe we need to perform some debridement to clean away the necrotic tissue on the surface of the wound. 05/15/18 on evaluation today patient actually appears to be doing a little better in regard to his wound. Fortunately there is some improvement noted fortunately there's also no significant evidence of infection at this point in time. He does have  continued pain that really nothing different than previous he did get his Juxta-Lite wrap. 05/29/18 on evaluation today patient actually appears to be doing somewhat better in regard to his wound currently. He's been tolerating the dressing changes without complication. With that being said he has been performing the [facility] Dressing changes every day at home. I'm pretty sure that we told him every other day last week or when I saw him rather two weeks ago. With that being said obviously did not hurt to do it daily just that obviously is gonna run out of supplies sooner and that could get him into trouble as far as his insurance is concerned. Nonetheless he at this point still continues to have pain although honestly  I don't feel like it's as bad as what it was in the past just based on his reactions at this point. 06/12/18 on evaluation today patient actually appears to be doing better in regard to his lower [name] ulcer. He's been tolerating the dressing changes without complication. Fortunately there does not appear to be any evidence of infection at this time. No fevers, chills, nausea, or vomiting noted at this time. 06/19/18 evaluation today patient's ulcer on the lower extremity appears to be doing better at this point. he has been tolerating the dressing changes. The patient seems to be somewhat depressed about the progress of his wound although the overall appearance at this time seems to be doing well. In general I'm very pleased with the appearance today he does have some biofilm on the surface of the wound as well as of hyper granular tissue we may want to utilize a little bit of silver nitrate today. 06/27/2018; patient comes into clinic today for a wound on the right lateral calf likely related to chronic venous insufficiency. He has been using medihoney covered with Hydrofera Blue. Wound surface actually looks quite good 07/03/2018 seen today for follow-up and management of lower  extremity ulcer right lateral shin. Tolerating dressing changes. He has a small amount of biofilm today. Will attempt to remove a portion of the biofilm as he tolerates. He becomes very anxious with wound treatments. He would benefit from layer compression wraps however he refuses compression wraps or anything that smokes his legs. He expressed an inability to tolerate compression wraps. Anthony Barnes, Anthony Barnes ([id number]) [id number] Page 8 of 13 lite wraps have been ordered. He has been instructed on the appropriate application of the juxta leg wraps. His blood pressure today on visit was 200/120. He denies any blurred vision, chest pain, dizziness, shortness of breath, or difficulty with mobility. Mr. Loistine SimasGladney stated that he had a recent visit with his primary care provider. At that time his carvedilol was decreased from 25 mg daily to 12.5 mg daily. Strongly encouraged Mr. Loistine SimasGladney to seek medical attention today during visit. He declined transport for evaluation of elevated blood pressure. Encouraged him to contact his primary care provider today for medication management. He stated that he would call his primary care doctor after wound visit today. Also encouraged him that if he experienced any symptoms of blurred vision, chest pain, shortness of breath to immediately seek medical attention. 07/17/18 on evaluation today patient's wound actually does seem to show signs of improvement based on the overall appearance of the wound bed today. There does not appear to show any signs of infection which is good news. There is no overall worsening which is also good news. He still has hyper granular tissue will use in the [employer] [facility] this point. 07/31/18 on evaluation today patient's wound bed actually show signs of improvement with good epithelialization especially in the upper portion of the wound. He has been tolerating the dressing changes  without complication in general. Overall I'm extremely happy with how things stand. No fevers, chills, nausea, or vomiting noted at this time. 08/28/18 on evaluation today patient appears to be doing a little better in regard to his lower extremity ulcer. With that being said it appears to be very hyper granular I think this is directly attributed to the fact that he continues to use the [employer] underneath the [facility]. Although this helps not to stick unfortunately also think he's not getting the benefit of the  Hydrofera Blue Dressing particular and subsequently is causing too much moisture buildup hence the hyper granulation. Fortunately there does not appear to be any signs of infection at this time which is good news. 09/03/18 on evaluation today patient appears to be doing well in regard to his lower [name] ulcer. He has been tolerating the dressing changes without complication. Fortunately he has much less hyper granular tissue the noted last week I do think using silver nitrate in changing to using the [facility] Dressing without the mepitel has made a big difference for him this is good news. 09/18/18 on evaluation today patient appears to be doing excellent in regard to his right lower [name] ulcer. This is actually significantly smaller even compared to the last evaluation. Overall I'm very pleased in this regard. I'm a recommend that we likely repeat the silver nitrate today based on what I'm seeing. 10/02/18 on evaluation today patient appears to be doing excellent in regard to his lower extremity wound on the right. He's been tolerating the dressing changes without complication. Fortunately there's no signs of infection at this time. Overall been very pleased with how things seem to be progressing. No fevers, chills, nausea, or vomiting noted at this time. 10/16/18 on evaluation today patient actually appears to be doing much better in regard to his lower extremity wound on  the right. This is shown signs of good improvement and is indeed measuring smaller he is on a excellent track as far as healing is concerned. My hope is this will be healed of the next several weeks. Fortunately there is no evidence of infection at this time. He does seem to be doing everything that I'm recommending for him 10/30/18 on evaluation today patient appears to be doing better in regard to his lower extremity ulcer. He's been tolerating the dressing changes without complication. Fortunately the Hydrofera Blue Dressing to be doing the job. He has made excellent progress. With that being said this is still going somewhat slow but nonetheless is always making good progress at this point. 5/18-Patient was in to be seen for right leg ulcer that appeared after scab above it was removed today. This area had healed completely. 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 The wound has not healed though it has been nearly 2 months since the injury occurred. He was seen in the emergency department at [facility] on May 9. At that time it was very painful and he described it as a "15 on a scale of 0oo10". He also was found to have 3+ pitting edema to the bilateral lower extremities. He was prescribed a weeks course of Keflex. He is here today because the wound has failed to heal and he has a prior history in our clinic. ABI in clinic today was noncompressible. He did have formal vascular studies performed in 2019 which were normal. The last hemoglobin A1c I have available for review was from March 21, 2021 and was elevated at 7.7%. The wound is fairly small and circular located about 3 inches above his right lateral malleolus. It  is completely covered with eschar. No surrounding erythema, no odor, no purulent drainage. 01/11/2022: The wound on his right lateral leg is a little bit  smaller today. It has reaccumulated some slough and a small bit of eschar. It remains fairly painful. He has another area on his anterior tibia that he will not let me touch but I am concerned that there is a wound forming underneath the surface. 01/18/2022: Unfortunately, his wound is a little bit bigger today. He continues to accumulate slough on the wound surface. It is still quite tender. 01/25/2022: The patient bumped his wound on the car door which resulted in bleeding. He was seen at urgent care where it was redressed. Unfortunately, the wound is bigger again today. There is accumulated slough on the surface. Urgent care gave him some tramadol, apparently and he took this prior to his visit so he could tolerate debridement better. 02/02/2022: The wound is a little bit smaller today. The surface, however, has accumulated a fairly thick layer of slough. The periwound skin is intact and there is no obvious sign of infection. 02/07/2022: The wound is a bit larger today. It has thick slough accumulation. The periwound skin is intact and there is no obvious erythema, induration, nor any odor. 02/16/2022: I took a culture last week due to the appearance of the wound and the patient's degree of pain. This grew out methicillin sensitive Staph aureus. Augmentin was prescribed. The patient states that the pain is markedly improved today. The wound also looks much better. It is smaller with less slough buildup and there is good granulation tissue emerging. 02/21/2022: The patient's pain is quite improved from prior visits. The wound is about the same size and has some accumulated slough on the surface. The base is a little bit fibrotic but there are some areas of granulation tissue filling in. 02/28/2022: The wound is a little bit narrower on its measurements but  slightly longer. There is still some slough accumulation on the surface, but he has minimal pain and there is no concern for ongoing infection. Granulation tissue is emerging. 03/07/2022: During the week, he cut holes in his wrap because it was painful and too tight. As a result, his leg was quite a bit more swollen today and his wound was larger. The wound surface is fairly clean but a little bit dry. Minimal slough accumulation. Granulation tissue present. 03/14/2022: Once again, he did some arts and crafts projects with his wrap. On the other hand, however, his wound does look better. It is smaller today and is beginning to fill in. Minimal slough and a small amount of eschar accumulation. 03/27/2022: His wound measured bigger today, although it does not appear to be so on my impression. The wound continues to fill with granulation tissue. There is a little bit of slough and eschar accumulation. He continues to cut his wrap off of his feet and his edema control at the ankle is particularly poor with 3+ pitting edema. His insurance will not cover any sort of skin substitute unfortunately. 04/04/2022: The wound is smaller today. It is more superficial with good granulation tissue and just a little bit of slough. He continues to cut away portions of his wrap which results in inadequate edema control. Anthony Barnes, Anthony Barnes ([id number]) [id number] Page 9 of 13 04/11/2022: His wound measured a couple millimeters larger today. It continues to fill with granulation tissue and there is minimal slough on the surface. 04/19/2022: No significant change in the wound dimensions, but the surface is healthier and less fibrotic. 04/26/2022: The wound is a little bit bigger again today. There is slough accumulation on the surface. Edema control is  inadequate. He has been cutting the foot portion of his wrap off each week. 05/03/2022: The wound is a little smaller this week. There is still a fair  amount of slough on the surface. His drainage was a little bit as greenish today, although not the blue-green typically associated with Pseudomonas. No significant odor. The underlying granulation tissue appears healthy. The culture that I took last week grew out methicillin sensitive Staph aureus. 05/10/2022: The wound is again a little bit smaller this week. He still has slough accumulation on the surface. Most of the surface has fairly decent granulation tissue, but there is still a fibrotic portion in the center near the caudal aspect of the wound. He did not pick up the Augmentin that I prescribed for his MSSA infection. He says he will get it today. 05/19/2022: His wound is smaller by half a centimeter today. There is a little bit of slough buildup, but less so than at previous visits. He has not been taking his Augmentin properly. He has been splitting the tablets in half and taking them on an irregular schedule. He says this is because it has been upsetting his stomach. 05/25/2022: His wound is a little bit smaller again this week. Very minimal slough accumulation. The wound is drier than I would like to see. He completed his course of Augmentin. Patient History Information obtained from Patient. Family History Cancer - Mother, Diabetes - Mother,Father, Hypertension - Mother,Father, Kidney Disease - Siblings, Stroke - Father, No family history of Heart Disease, Hereditary Spherocytosis, Lung Disease, Seizures, Thyroid Problems, Tuberculosis. Social History Former smoker - ended on 08/14/1990, Marital Status - Married, Alcohol Use - Moderate, Drug Use - No History, Caffeine Use - Daily. Medical History Eyes Denies history of Cataracts, Glaucoma, Optic Neuritis Ear/Nose/Mouth/Throat Denies history of Chronic sinus problems/congestion, Middle ear problems Hematologic/Lymphatic Denies history of Anemia, Hemophilia, Human Immunodeficiency Virus, Lymphedema, Sickle Cell  Disease Respiratory Denies history of Aspiration, Asthma, Chronic Obstructive Pulmonary Disease (COPD), Pneumothorax, Sleep Apnea, Tuberculosis Cardiovascular Patient has history of Hypertension Denies history of Angina, Arrhythmia, Congestive Heart Failure, Coronary Artery Disease, Deep Vein Thrombosis, Hypotension, Myocardial Infarction, Peripheral Arterial Disease, Peripheral Venous Disease, Phlebitis, Vasculitis Gastrointestinal Denies history of Cirrhosis , Colitis, Crohnoos, Hepatitis A, Hepatitis B, Hepatitis C Endocrine Patient has history of Type II Diabetes Genitourinary Denies history of End Stage Renal Disease Immunological Denies history of Lupus Erythematosus, Raynaudoos, Scleroderma Integumentary (Skin) Denies history of History of Burn Musculoskeletal Denies history of Gout, Rheumatoid Arthritis, Osteoarthritis, Osteomyelitis Neurologic Denies history of Dementia, Neuropathy, Quadriplegia, Paraplegia, Seizure Disorder Oncologic Denies history of Received Chemotherapy, Received Radiation Psychiatric Denies history of Anorexia/bulimia, Confinement Anxiety Hospitalization/Surgery History - esophagogastroduodenoscopy. - brain aneurysm surgery. Medical A Surgical History Notes nd Constitutional Symptoms (General Health) obesity Gastrointestinal diverticulitis Neurologic neuropathy Objective Constitutional Hypertensive, asymptomatic. No acute distress.. Vitals Time Taken: 8:35 AM, Height: 75 in, Weight: 310 lbs, BMI: 38.7, Temperature: 97.8 F, Pulse: 65 bpm, Respiratory Rate: 18 breaths/min, Blood Pressure: Anthony Barnes, Anthony Barnes ([id number]) [id number] Page 10 of 13 158/94 mmHg. Respiratory Normal work of breathing on room air.. General Notes: 05/25/2022: His wound is a little bit smaller again this week. Very minimal slough accumulation. The wound is drier than I would like to see. Integumentary (Hair, Skin) Wound #2 status is Open.  Original cause of wound was Trauma. The date acquired was: 12/03/2021. The wound has been in treatment 20 weeks. The wound is located on the Right,Lateral Lower Leg. The wound measures 3.9cm length x 1.4cm width x  0.1cm depth; 4.288cm^2 area and 0.429cm^3 volume. There is Fat Layer (Subcutaneous Tissue) exposed. There is no tunneling or undermining noted. There is a medium amount of serosanguineous drainage noted. The wound margin is distinct with the outline attached to the wound base. There is large (67-100%) red granulation within the wound bed. There is a small (1-33%) amount of necrotic tissue within the wound bed including Adherent Slough. The periwound skin appearance had no abnormalities noted for texture. The periwound skin appearance had no abnormalities noted for color. The periwound skin appearance exhibited: Dry/Scaly. Periwound temperature was noted as No Abnormality. Assessment Active Problems ICD-10 Non-pressure chronic ulcer of other part of right lower leg with fat layer exposed Type 2 diabetes mellitus with other skin ulcer Type 2 diabetes mellitus without complications Essential (primary) hypertension Venous insufficiency (chronic) (peripheral) Procedures Wound #2 Pre-procedure diagnosis of Wound #2 is a Venous Leg Ulcer located on the Right,Lateral Lower Leg .Severity of Tissue Pre Debridement is: Fat layer exposed. There was a Selective/Open Wound Non-Viable Tissue Debridement with a total area of 5.46 sq cm performed by Duanne Guess, MD. With the following instrument(s): Curette to remove Non-Viable tissue/material. Material removed includes Biofilm after achieving pain control using Lidocaine 5% topical ointment. No specimens were taken. A time out was conducted at 08:50, prior to the start of the procedure. A Minimum amount of bleeding was controlled with Pressure. The procedure was tolerated well with a pain level of 6 throughout and a pain level of 1 following the  procedure. Post Debridement Measurements: 3.9cm length x 1.4cm width x 0.1cm depth; 0.429cm^3 volume. Character of Wound/Ulcer Post Debridement is improved. Severity of Tissue Post Debridement is: Fat layer exposed. Post procedure Diagnosis Wound #2: Same as Pre-Procedure General Notes: scribed for Dr. Lady Gary by Samuella Bruin, RN. Plan Follow-up Appointments: Return Appointment in 1 week. - Dr. Lady Gary - room 2 Anesthetic: Wound #2 Right,Lateral Lower Leg: (In clinic) Topical Lidocaine 5% applied to wound bed Bathing/ Shower/ Hygiene: May shower with protection but do not get wound dressing(s) wet. - may purchase a cast protector from [facility] or [facility] Edema Control - Lymphedema / SCD / Other: Elevate legs to the level of the heart or above for 30 minutes daily and/or when sitting, a frequency of: Avoid standing for long periods of time. Patient to wear own compression stockings every day. Exercise regularly Moisturize legs daily. Compression stocking or Garment 20-30 mm/Hg pressure to: - to L left until R leg is healed The following medication(s) was prescribed: lidocaine topical 5 % ointment ointment topical was prescribed at facility WOUND #2: - Lower Leg Wound Laterality: Right, Lateral Cleanser: Soap and Water 1 x Per Week/30 Days Discharge Instructions: May shower and wash wound with dial antibacterial soap and water prior to dressing change. Cleanser: Wound Cleanser 1 x Per Week/30 Days Discharge Instructions: Cleanse the wound with wound cleanser prior to applying a clean dressing using gauze sponges, not tissue or cotton balls. Peri-Wound Care: Sween Lotion (Moisturizing lotion) 1 x Per Week/30 Days Discharge Instructions: Apply moisturizing lotion as directed Topical: Skintegrity Hydrogel 4 (oz) 1 x Per Week/30 Days Discharge Instructions: Apply hydrogel as directed Prim Dressing: Hydrofera Blue Ready Foam, 4x5 in 1 x Per Week/30 Days ary Discharge Instructions: Apply to  wound bed as instructed Secondary Dressing: Woven Gauze Sponge, Non-Sterile 4x4 in 1 x Per Week/30 Days Discharge Instructions: Apply over primary dressing as directed. Secured With: Coban Self-Adherent Wrap 4x5 (in/yd) 1 x Per Week/30 Days Discharge Instructions: Secure  with Coban as directed. Secured With: [employer], 4.5x3.1 (in/yd) 1 x Per Week/30 Days Discharge Instructions: Secure with Kerlix as directed. Anthony Barnes, Anthony Barnes ([id number]) [id number] Page 11 of 13 Secured With: 21M Medipore H Soft Cloth Surgical T ape, 4 x 10 (in/yd) 1 x Per Week/30 Days Discharge Instructions: Secure with tape as directed. 05/25/2022: His wound is a little bit smaller again this week. Very minimal slough accumulation. The wound is drier than I would like to see. I used a curette to debride some thin slough and biofilm from the wound. I wanted to change him to [facility] to help with moisture balance, but he reminded me that he has not tolerated this in the past. We will continue to use the [facility] ready foam but moisten it with hydrogel instead of saline. Follow-up in 1 week. Electronic Signature(s) Signed: 05/29/2022 7:43:29 AM By: Duanne Guess MD FACS Previous Signature: 05/25/2022 9:24:38 AM Version By: Duanne Guess MD FACS Entered By: Duanne Guess on 05/29/2022 07:43:28 -------------------------------------------------------------------------------- HxROS Details Patient Name: Date of Service: Anthony Barnes, Anthony [name] 05/25/2022 8:30 A M Medical Record Number: [id number] Patient Account Number: [id number] Date of Birth/Sex: Treating RN: 09-Jul-1956 (66 y.o. [name] Primary Care Provider: Hoy Register Other Clinician: Referring Provider: Treating Provider/Extender: Camillo Flaming Weeks in Treatment: 20 Information Obtained From Patient Constitutional Symptoms (General Health) Medical History: Past Medical History  Notes: obesity Eyes Medical History: Negative for: Cataracts; Glaucoma; Optic Neuritis Ear/Nose/Mouth/Throat Medical History: Negative for: Chronic sinus problems/congestion; Middle ear problems Hematologic/Lymphatic Medical History: Negative for: Anemia; Hemophilia; Human Immunodeficiency Virus; Lymphedema; Sickle Cell Disease Respiratory Medical History: Negative for: Aspiration; Asthma; Chronic Obstructive Pulmonary Disease (COPD); Pneumothorax; Sleep Apnea; Tuberculosis Cardiovascular Medical History: Positive for: Hypertension Negative for: Angina; Arrhythmia; Congestive Heart Failure; Coronary Artery Disease; Deep Vein Thrombosis; Hypotension; Myocardial Infarction; Peripheral Arterial Disease; Peripheral Venous Disease; Phlebitis; Vasculitis Gastrointestinal Medical History: Negative for: Cirrhosis ; Colitis; Crohns; Hepatitis A; Hepatitis B; Hepatitis C Past Medical History Notes: diverticulitis Anthony Barnes, Anthony Barnes ([id number]) [id number] Page 12 of 13 Endocrine Medical History: Positive for: Type II Diabetes Time with diabetes: 8 years Treated with: Oral agents Blood sugar tested every day: No Genitourinary Medical History: Negative for: End Stage Renal Disease Immunological Medical History: Negative for: Lupus Erythematosus; Raynauds; Scleroderma Integumentary (Skin) Medical History: Negative for: History of Burn Musculoskeletal Medical History: Negative for: Gout; Rheumatoid Arthritis; Osteoarthritis; Osteomyelitis Neurologic Medical History: Negative for: Dementia; Neuropathy; Quadriplegia; Paraplegia; Seizure Disorder Past Medical History Notes: neuropathy Oncologic Medical History: Negative for: Received Chemotherapy; Received Radiation Psychiatric Medical History: Negative for: Anorexia/bulimia; Confinement Anxiety Immunizations Pneumococcal Vaccine: Received Pneumococcal Vaccination: No Immunization Notes: tetanus 2  years ago Implantable Devices None Hospitalization / Surgery History Type of Hospitalization/Surgery esophagogastroduodenoscopy brain aneurysm surgery Family and Social History Cancer: Yes - Mother; Diabetes: Yes - Mother,Father; Heart Disease: No; Hereditary Spherocytosis: No; Hypertension: Yes - Mother,Father; Kidney Disease: Yes - Siblings; Lung Disease: No; Seizures: No; Stroke: Yes - Father; Thyroid Problems: No; Tuberculosis: No; Former smoker - ended on 08/14/1990; Marital Status - Married; Alcohol Use: Moderate; Drug Use: No History; Caffeine Use: Daily; Financial Concerns: No; Food, Clothing or Shelter Needs: No; Support System Lacking: No; Transportation Concerns: No Electronic Signature(s) Signed: 05/25/2022 10:08:47 AM By: Duanne Guess MD FACS Signed: 05/25/2022 4:09:44 PM By: Gelene Mink By: Duanne Guess on 05/25/2022 62:95:28 Anthony Barnes ([id number]) [id number] Page 13 of 13 -------------------------------------------------------------------------------- SuperBill Details Patient Name: Date of Service: Anthony Barnes, Anthony [name] 05/25/2022 Medical  Record Number: [id number] Patient Account Number: [id number] Date of Birth/Sex: Treating RN: 1956/06/02 (66 y.o. [name] Primary Care Provider: Hoy Register Other Clinician: Referring Provider: Treating Provider/Extender: Camillo Flaming Weeks in Treatment: 20 Diagnosis Coding ICD-10 Codes Code Description [phone] Non-pressure chronic ulcer of other part of right lower leg with fat layer exposed E11.622 Type 2 diabetes mellitus with other skin ulcer E11.9 Type 2 diabetes mellitus without complications I10 Essential (primary) hypertension I87.2 Venous insufficiency (chronic) (peripheral) Facility Procedures : CPT4 Code: [id number] Description: 44967 - DEBRIDE WOUND 1ST 20 SQ CM OR < ICD-10 Diagnosis Description L97.812 Non-pressure chronic  ulcer of other part of right lower leg with fat layer expos Modifier: ed Quantity: 1 Physician Procedures : CPT4 Code Description Modifier [id number] - WC PHYS LEVEL 3 - EST PT 25 ICD-10 Diagnosis Description 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osed E11.622 Type 2 diabetes mellitus with other skin ulcer  I87.2 Venous insufficiency (chronic) (peripheral) E11.9 Type 2 diabetes mellitus without complications Quantity: 1 : [id number] - WC PHYS DEBR WO ANESTH 20 SQ CM ICD-10 Diagnosis Description 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osed Quantity: 1 Electronic Signature(s) Signed: 05/25/2022 9:25:02 AM By: Duanne Guess MD FACS Entered By: Duanne Guess on 05/25/2022 09:25:01

## 2022-05-25 NOTE — Progress Notes (Signed)
ZAILEN, ALBARRAN ([id number]) [id number] Page 1 of 7 Visit Report for 05/25/2022 Arrival Information Details Patient Name: Date of Service: Anthony Barnes [name] 05/25/2022 8:30 A M Medical Record Number: [id number] Patient Account Number: [id number] Date of Birth/Sex: Treating RN: 1955-10-02 (66 y.o. [name] Primary Care Charlies Rayburn: Charlott Rakes Other Clinician: Referring Makeya Hilgert: Treating Mayerli Kirst/Extender: Carter Kitten Weeks in Treatment: 20 Visit Information History Since Last Visit Added or deleted any medications: No Patient Arrived: Ambulatory Any new allergies or adverse reactions: No Arrival Time: 08:35 Had a fall or experienced change in No Accompanied By: self activities of daily living that may affect Transfer Assistance: None risk of falls: Patient Identification Verified: Yes Signs or symptoms of abuse/neglect since last visito No Secondary Verification Process Completed: Yes Hospitalized since last visit: No Patient Requires Transmission-Based Precautions: No Implantable device outside of the clinic excluding No Patient Has Alerts: No cellular tissue based products placed in the center since last visit: Has Dressing in Place as Prescribed: Yes Has Compression in Place as Prescribed: Yes Pain Present Now: No Electronic Signature(s) Signed: 05/25/2022 4:09:44 PM By: Adline Peals Entered By: Adline Peals on 05/25/2022 08:35:44 -------------------------------------------------------------------------------- Encounter Discharge Information Details Patient Name: Date of Service: Anthony Barnes, Anthony [name] 05/25/2022 8:30 A M Medical Record Number: [id number] Patient Account Number: [id number] Date of Birth/Sex: Treating RN: February 13, 1956 (66 y.o. [name] Primary Care Mallarie Voorhies: Charlott Rakes Other Clinician: Referring Beatris Belen: Treating Odeth Bry/Extender: Carter Kitten Weeks in Treatment: 20 Encounter Discharge Information Items Post Procedure Vitals Discharge Condition: Stable Temperature (F): 97.8 Ambulatory Status: Ambulatory Pulse (bpm): 65 Discharge Destination: Home Respiratory Rate (breaths/min): 18 Transportation: Private Auto Blood Pressure (mmHg): 158/94 Accompanied By: self Schedule Follow-up Appointment: Yes Clinical Summary of Care: Patient Declined Electronic Signature(s) Signed: 05/25/2022 4:09:44 PM By: Adline Peals Entered By: Adline Peals on 05/25/2022 10:01:40 Anthony Barnes ([id number]) [id number] Page 2 of 7 -------------------------------------------------------------------------------- Lower Extremity Assessment Details Patient Name: Date of Service: Anthony Barnes [name] 05/25/2022 8:30 A M Medical Record Number: [id number] Patient Account Number: [id number] Date of Birth/Sex: Treating RN: Jul 19, 1956 (66 y.o. [name] Primary Care Santa Abdelrahman: Charlott Rakes Other Clinician: Referring Azora Bonzo: Treating Avery Klingbeil/Extender: Carter Kitten Weeks in Treatment: 20 Edema Assessment Assessed: [Left: No] [Right: No] [Left: Edema] [Right: :] Calf Left: Right: Point of Measurement: From Medial Instep 43.1 cm Ankle Left: Right: Point of Measurement: From Medial Instep 27 cm Vascular Assessment Pulses: Dorsalis Pedis Palpable: [Right:Yes] Electronic Signature(s) Signed: 05/25/2022 4:09:44 PM By: Adline Peals Entered By: Adline Peals on 05/25/2022 08:42:21 -------------------------------------------------------------------------------- Multi Wound Chart Details Patient Name: Date of Service: Anthony Barnes, Anthony [name] 05/25/2022 8:30 A M Medical Record Number: [id number] Patient Account Number: [id number] Date of Birth/Sex: Treating RN: [birth year] (66 y.o. [name] Primary Care Hiral Lukasiewicz: Charlott Rakes Other  Clinician: Referring Dannie Hattabaugh: Treating Rjay Revolorio/Extender: Carter Kitten Weeks in Treatment: 20 Vital Signs Height(in): 75 Pulse(bpm): 65 Weight(lbs): 310 Blood Pressure(mmHg): 158/94 Body Mass Index(BMI): 38.7 Temperature(F): 97.8 Respiratory Rate(breaths/min): 18 [2:Photos:] [N/A:N/A] Right, Lateral Lower Leg N/A N/A Wound Location: Trauma N/A N/A Wounding Event: Venous Leg Ulcer N/A N/A Primary Etiology: Hypertension, Type II Diabetes N/A N/A Comorbid History: 12/03/2021 N/A N/A Date Acquired: 20 N/A N/A Weeks of Treatment: Open N/A N/A Wound Status: No N/A N/A Wound Recurrence: 3.9x1.4x0.1 N/A N/A Measurements L x W x D (cm) 4.288 N/A N/A A (cm) : rea 0.429 N/A N/A Volume (cm) : -304.50% N/A N/A %  Reduction in A rea: -304.70% N/A N/A % Reduction in Volume: Full Thickness Without Exposed N/A N/A Classification: Support Structures Medium N/A N/A Exudate Amount: Serosanguineous N/A N/A Exudate Type: red, brown N/A N/A Exudate Color: Distinct, outline attached N/A N/A Wound Margin: Large (67-100%) N/A N/A Granulation Amount: Red N/A N/A Granulation Quality: Small (1-33%) N/A N/A Necrotic Amount: Fat Layer (Subcutaneous Tissue): Yes N/A N/A Exposed Structures: Fascia: No Tendon: No Muscle: No Joint: No Bone: No Small (1-33%) N/A N/A Epithelialization: No Abnormalities Noted N/A N/A Periwound Skin Texture: Dry/Scaly: Yes N/A N/A Periwound Skin Moisture: No Abnormalities Noted N/A N/A Periwound Skin Color: No Abnormality N/A N/A Temperature: Treatment Notes Electronic Signature(s) Signed: 05/25/2022 9:21:16 AM By: Fredirick Maudlin MD FACS Signed: 05/25/2022 4:09:44 PM By: Adline Peals Entered By: Fredirick Maudlin on 05/25/2022 09:21:16 -------------------------------------------------------------------------------- Multi-Disciplinary Care Plan Details Patient Name: Date of Service: Anthony Barnes, Anthony [name]  05/25/2022 8:30 A M Medical Record Number: [id number] Patient Account Number: [id number] Date of Birth/Sex: Treating RN: 1956/02/23 (66 y.o. [name] Primary Care Deno Sida: Charlott Rakes Other Clinician: Referring Zayne Marovich: Treating Caidon Foti/Extender: Carter Kitten Weeks in Treatment: 20 Active Inactive Venous Leg Ulcer Nursing Diagnoses: Actual venous Insuffiency (use after diagnosis is confirmed) Knowledge deficit related to disease process and management Goals: Patient will maintain optimal edema control Date Initiated: 01/02/2022 Target Resolution Date: 06/09/2022 Goal Status: Active Patient/caregiver will verbalize understanding of disease process and disease management Date Initiated: 01/02/2022 Date Inactivated: 03/07/2022 Target Resolution Date: 03/09/2022 Goal Status: Met Interventions: Assess peripheral edema status every visit. Anthony Barnes, Anthony Barnes ([id number]) [id number] Page 4 of 7 Compression as ordered Provide education on venous insufficiency Treatment Activities: Non-invasive vascular studies : 01/02/2022 T ordered outside of clinic : 01/02/2022 est Therapeutic compression applied : 01/02/2022 Notes: Wound/Skin Impairment Nursing Diagnoses: Impaired tissue integrity Knowledge deficit related to ulceration/compromised skin integrity Goals: Patient/caregiver will verbalize understanding of skin care regimen Date Initiated: 01/02/2022 Date Inactivated: 02/02/2022 Target Resolution Date: 02/03/2022 Goal Status: Met Ulcer/skin breakdown will have a volume reduction of 30% by week 4 Date Initiated: 01/02/2022 Target Resolution Date: 06/09/2022 Goal Status: Active Interventions: Assess ulceration(s) every visit Provide education on ulcer and skin care Treatment Activities: Skin care regimen initiated : 01/02/2022 Topical wound management initiated : 01/02/2022 Notes: Electronic Signature(s) Signed:  05/25/2022 4:09:44 PM By: Adline Peals Entered By: Adline Peals on 05/25/2022 08:46:31 -------------------------------------------------------------------------------- Pain Assessment Details Patient Name: Date of Service: Anthony Barnes, Anthony [name] 05/25/2022 8:30 A M Medical Record Number: [id number] Patient Account Number: [id number] Date of Birth/Sex: Treating RN: 11-19-1955 (66 y.o. [name] Primary Care Leoni Goodness: Charlott Rakes Other Clinician: Referring Makinlee Awwad: Treating Larry Knipp/Extender: Carter Kitten Weeks in Treatment: 20 Active Problems Location of Pain Severity and Description of Pain Patient Has Paino No Site Locations Rate the pain. Anthony Barnes, Anthony Barnes ([id number]) [id number] Page 5 of 7 Rate the pain. Current Pain Level: 0 Pain Management and Medication Current Pain Management: Electronic Signature(s) Signed: 05/25/2022 4:09:44 PM By: Adline Peals Entered By: Adline Peals on 05/25/2022 08:37:52 -------------------------------------------------------------------------------- Patient/Caregiver Education Details Patient Name: Date of Service: Anthony Barnes, Anthony [name] 10/12/2023andnbsp8:30 A M Medical Record Number: [id number] Patient Account Number: [id number] Date of Birth/Gender: Treating RN: 1955/10/04 (66 y.o. [name] Primary Care Physician: Charlott Rakes Other Clinician: Referring Physician: Treating Physician/Extender: Carter Kitten Weeks in Treatment: 20 Education Assessment Education Provided To: Patient Education Topics Provided Wound/Skin Impairment: Methods: Explain/Verbal Responses: Reinforcements needed, State content correctly Electronic Signature(s) Signed: 05/25/2022 4:09:44  PM By: Sabas Sous By: Adline Peals on 05/25/2022  08:46:54 -------------------------------------------------------------------------------- Wound Assessment Details Patient Name: Date of Service: Anthony Barnes [name] 05/25/2022 8:30 A M Medical Record Number: [id number] Patient Account Number: [id number] Date of Birth/Sex: Treating RN: Mar 10, 1956 (66 y.o. [name] Primary Care Brendolyn Stockley: Charlott Rakes Other Clinician: Referring Anthony Barnes: Treating Anthony Barnes, Anthony Barnes ([id number]) [id number] Page 6 of 7 Weeks in Treatment: 20 Wound Status Wound Number: 2 Primary Etiology: Venous Leg Ulcer Wound Location: Right, Lateral Lower Leg Wound Status: Open Wounding Event: Trauma Comorbid History: Hypertension, Type II Diabetes Date Acquired: 12/03/2021 Weeks Of Treatment: 20 Clustered Wound: No Photos Wound Measurements Length: (cm) 3.9 Width: (cm) 1.4 Depth: (cm) 0.1 Area: (cm) 4.288 Volume: (cm) 0.429 % Reduction in Area: -304.5% % Reduction in Volume: -304.7% Epithelialization: Small (1-33%) Tunneling: No Undermining: No Wound Description Classification: Full Thickness Without Exposed Support Structures Wound Margin: Distinct, outline attached Exudate Amount: Medium Exudate Type: Serosanguineous Exudate Color: red, brown Foul Odor After Cleansing: No Slough/Fibrino Yes Wound Bed Granulation Amount: Large (67-100%) Exposed Structure Granulation Quality: Red Fascia Exposed: No Necrotic Amount: Small (1-33%) Fat Layer (Subcutaneous Tissue) Exposed: Yes Necrotic Quality: Adherent Slough Tendon Exposed: No Muscle Exposed: No Joint Exposed: No Bone Exposed: No Periwound Skin Texture Texture Color No Abnormalities Noted: Yes No Abnormalities Noted: Yes Moisture Temperature / Pain No Abnormalities Noted: No Temperature: No Abnormality Dry / Scaly: Yes Treatment Notes Wound #2 (Lower Leg) Wound Laterality: Right,  Lateral Cleanser Soap and Water Discharge Instruction: May shower and wash wound with dial antibacterial soap and water prior to dressing change. Wound Cleanser Discharge Instruction: Cleanse the wound with wound cleanser prior to applying a clean dressing using gauze sponges, not tissue or cotton balls. Peri-Wound Care Sween Lotion (Moisturizing lotion) Discharge Instruction: Apply moisturizing lotion as directed Topical Skintegrity Hydrogel 4 (oz) Discharge Instruction: Apply hydrogel as directed Anthony Barnes, Anthony Barnes ([id number]) [id number] Page 7 of 7 Primary Dressing Hydrofera Blue Ready Foam, 4x5 in Discharge Instruction: Apply to wound bed as instructed Secondary Dressing Woven Gauze Sponge, Non-Barnes 4x4 in Discharge Instruction: Apply over primary dressing as directed. Secured With [employer] 4x5 (in/yd) Discharge Instruction: Secure with Coban as directed. Anthony Barnes, Anthony (in/yd) Discharge Instruction: Secure with Anthony as directed. 63M Medipore H Soft Cloth Surgical T ape, 4 x 10 (in/yd) Discharge Instruction: Secure with tape as directed. Compression Wrap Compression Stockings Add-Ons Electronic Signature(s) Signed: 05/25/2022 4:09:44 PM By: Adline Peals Entered By: Adline Peals on 05/25/2022 08:44:53 -------------------------------------------------------------------------------- Vitals Details Patient Name: Date of Service: Anthony Barnes, Anthony [name] 05/25/2022 8:30 A M Medical Record Number: [id number] Patient Account Number: [id number] Date of Birth/Sex: Treating RN: 07/16/1956 (66 y.o. [name] Primary Care Anthony Barnes: Charlott Rakes Other Clinician: Referring Anthony Barnes: Treating Kayleigh Broadwell/Extender: Carter Kitten Weeks in Treatment: 20 Vital Signs Time Taken: 08:35 Temperature (F): 97.8 Height (in): 75 Pulse (bpm): 65 Weight (lbs): 310 Respiratory Rate  (breaths/min): 18 Body Mass Index (BMI): 38.7 Blood Pressure (mmHg): 158/94 Reference Range: 80 - 120 mg / dl Electronic Signature(s) Signed: 05/25/2022 4:09:44 PM By: Adline Peals Entered By: Adline Peals on 05/25/2022 08:36:00

## 2022-06-01 ENCOUNTER — Encounter (HOSPITAL_BASED_OUTPATIENT_CLINIC_OR_DEPARTMENT_OTHER): Payer: Medicare Other | Admitting: Internal Medicine

## 2022-06-01 DIAGNOSIS — L97812 Non-pressure chronic ulcer of other part of right lower leg with fat layer exposed: Secondary | ICD-10-CM | POA: Diagnosis not present

## 2022-06-01 DIAGNOSIS — I872 Venous insufficiency (chronic) (peripheral): Secondary | ICD-10-CM | POA: Diagnosis not present

## 2022-06-01 DIAGNOSIS — I10 Essential (primary) hypertension: Secondary | ICD-10-CM | POA: Diagnosis not present

## 2022-06-01 DIAGNOSIS — Z6838 Body mass index (BMI) 38.0-38.9, adult: Secondary | ICD-10-CM | POA: Diagnosis not present

## 2022-06-01 DIAGNOSIS — E11622 Type 2 diabetes mellitus with other skin ulcer: Secondary | ICD-10-CM | POA: Diagnosis not present

## 2022-06-01 NOTE — Progress Notes (Signed)
RANJIT, ASHURST ([id number]) [id number] Page 1 of 8 Visit Report for 06/01/2022 Arrival Information Details Patient Name: Date of Service: Anthony Barnes Anthony E. 06/01/2022 9:15 A M Medical Record Number: [id number] Patient Account Number: [id number] Date of Birth/Sex: Treating RN: Mar 29, 1956 (66 y.o. Anthony Barnes Primary Care Haylo Fake: Charlott Rakes Other Clinician: Referring Mickaela Starlin: Treating Tonya Wantz/Extender: Ramon Dredge in Treatment: 21 Visit Information History Since Last Visit Added or deleted any medications: No Patient Arrived: Ambulatory Any new allergies or adverse reactions: No Arrival Time: 09:36 Had a fall or experienced change in No Accompanied By: self activities of daily living that may affect Transfer Assistance: None risk of falls: Patient Identification Verified: Yes Signs or symptoms of abuse/neglect since last visito No Secondary Verification Process Completed: Yes Hospitalized since last visit: No Patient Requires Transmission-Based Precautions: No Implantable device outside of the clinic excluding No Patient Has Alerts: No cellular tissue based products placed in the center since last visit: Has Dressing in Place as Prescribed: Yes Has Compression in Place as Prescribed: Yes Pain Present Now: No Electronic Signature(s) Signed: 06/01/2022 4:28:57 PM By: Adline Peals Entered By: Adline Peals on 06/01/2022 09:38:58 -------------------------------------------------------------------------------- Compression Therapy Details Patient Name: Date of Service: Anthony Floor, [name] Anthony E. 06/01/2022 9:15 A M Medical Record Number: [id number] Patient Account Number: [id number] Date of Birth/Sex: Treating RN: Jan 03, 1956 (66 y.o. Anthony Barnes Primary Care Onie Hayashi: Charlott Rakes Other Clinician: Referring Anthony Barnes: Treating Anthony Barnes/Extender: Anthony Barnes Weeks in  Treatment: 21 Compression Therapy Performed for Wound Assessment: Wound #2 Right,Lateral Lower Leg Performed By: Clinician Adline Peals, RN Compression Type: Three Layer Post Procedure Diagnosis Same as Pre-procedure Electronic Signature(s) Signed: 06/01/2022 4:28:57 PM By: Adline Peals Entered By: Adline Peals on 06/01/2022 12:27:30 [name] ([id number]) [id number] Page 2 of 8 -------------------------------------------------------------------------------- Encounter Discharge Information Details Patient Name: Date of Service: Anthony Barnes Anthony E. 06/01/2022 9:15 A M Medical Record Number: [id number] Patient Account Number: [id number] Date of Birth/Sex: Treating RN: October 18, 1955 (66 y.o. Anthony Barnes Primary Care Anthony Barnes: Charlott Rakes Other Clinician: Referring Brissa Asante: Treating Paizlie Klaus/Extender: Anthony Barnes Weeks in Treatment: 21 Encounter Discharge Information Items Post Procedure Vitals Discharge Condition: Stable Temperature (F): 97.9 Ambulatory Status: Ambulatory Pulse (bpm): 64 Discharge Destination: Home Respiratory Rate (breaths/min): 18 Transportation: Private Auto Blood Pressure (mmHg): 150/85 Accompanied By: self Schedule Follow-up Appointment: Yes Clinical Summary of Care: Patient Declined Electronic Signature(s) Signed: 06/01/2022 4:28:57 PM By: Adline Peals Entered By: Adline Peals on 06/01/2022 12:28:18 -------------------------------------------------------------------------------- Lower Extremity Assessment Details Patient Name: Date of Service: Anthony Floor, [name] Anthony E. 06/01/2022 9:15 A M Medical Record Number: [id number] Patient Account Number: [id number] Date of Birth/Sex: Treating RN: 1955/12/11 (66 y.o. Anthony Barnes Primary Care Anthony Barnes: Charlott Rakes Other Clinician: Referring Anthony Barnes: Treating Anthony Barnes/Extender: Anthony Barnes Weeks in Treatment: 21 Edema Assessment Assessed: Shirlyn Goltz: No] [Right: No] [Left: Edema] [Right: :] Calf Left: Right: Point of Measurement: From Medial Instep 42.2 cm Ankle Left: Right: Point of Measurement: From Medial Instep 26.6 cm Vascular Assessment Pulses: Dorsalis Pedis Palpable: [Right:Yes] Electronic Signature(s) Signed: 06/01/2022 4:28:57 PM By: Adline Peals Entered By: Adline Peals on 06/01/2022 09:45:58 Multi Wound Chart Details -------------------------------------------------------------------------------- [name] ([id number]) [id number] Page 3 of 8 Patient Name: Date of Service: Anthony Barnes Anthony E. 06/01/2022 9:15 A M Medical Record Number: [id number] Patient Account Number: [id number] Date of Birth/Sex: Treating RN: [birth year] (66 y.o. M) Primary Care Anthony Barnes: Charlott Rakes Other Clinician: Referring Anthony Barnes: Treating Anthony Barnes/Extender:  Anthony Barnes, Anthony Barnes Weeks in Treatment: 21 Vital Signs Height(in): 75 Pulse(bpm): 64 Weight(lbs): 310 Blood Pressure(mmHg): 150/85 Body Mass Index(BMI): 38.7 Temperature(F): 97.9 Respiratory Rate(breaths/min): 18 Wound Assessments Wound Number: 2 N/A N/A Photos: N/A N/A Right, Lateral Lower Leg N/A N/A Wound Location: Trauma N/A N/A Wounding Event: Venous Leg Ulcer N/A N/A Primary Etiology: Hypertension, Type II Diabetes N/A N/A Comorbid History: 12/03/2021 N/A N/A Date Acquired: 21 N/A N/A Weeks of Treatment: Open N/A N/A Wound Status: No N/A N/A Wound Recurrence: 4x1.1x0.1 N/A N/A Measurements L x W x D (cm) 3.456 N/A N/A A (cm) : rea 0.346 N/A N/A Volume (cm) : -226.00% N/A N/A % Reduction in A rea: -226.40% N/A N/A % Reduction in Volume: Full Thickness Without Exposed N/A N/A Classification: Support Structures Medium N/A N/A Exudate A mount: Serosanguineous N/A N/A Exudate Type: red, brown N/A N/A Exudate  Color: Distinct, outline attached N/A N/A Wound Margin: Large (67-100%) N/A N/A Granulation A mount: Red N/A N/A Granulation Quality: Small (1-33%) N/A N/A Necrotic A mount: Fat Layer (Subcutaneous Tissue): Yes N/A N/A Exposed Structures: Fascia: No Tendon: No Muscle: No Joint: No Bone: No Small (1-33%) N/A N/A Epithelialization: Debridement - Selective/Open Wound N/A N/A Debridement: Pre-procedure Verification/Time Out 09:56 N/A N/A Taken: Lidocaine 5% topical ointment N/A N/A Pain Control: Necrotic/Eschar, Slough N/A N/A Tissue Debrided: Non-Viable Tissue N/A N/A Level: 4.4 N/A N/A Debridement A (sq cm): rea Curette N/A N/A Instrument: Minimum N/A N/A Bleeding: Pressure N/A N/A Hemostasis A chieved: Procedure was tolerated well N/A N/A Debridement Treatment Response: 4x1.1x0.1 N/A N/A Post Debridement Measurements L x W x D (cm) 0.346 N/A N/A Post Debridement Volume: (cm) No Abnormalities Noted N/A N/A Periwound Skin Texture: Dry/Scaly: Yes N/A N/A Periwound Skin Moisture: No Abnormalities Noted N/A N/A Periwound Skin Color: No Abnormality N/A N/A Temperature: Debridement N/A N/A Procedures Performed: Treatment Notes Electronic Signature(s) Signed: 06/01/2022 4:29:29 PM By: Linton Ham MD [name] ([id number]) [phone] [id number].pdf Page 4 of 8 Entered By: Linton Ham on 06/01/2022 10:08:00 -------------------------------------------------------------------------------- Multi-Disciplinary Care Plan Details Patient Name: Date of Service: [name] 06/01/2022 9:15 A M Medical Record Number: [id number] Patient Account Number: [id number] Date of Birth/Sex: Treating RN: 1955-12-09 (66 y.o. Anthony Barnes Primary Care Narjis Mira: Charlott Rakes Other Clinician: Referring Neeka Urista: Treating Raphaela Cannaday/Extender: Anthony Barnes Weeks in Treatment: 21 Active Inactive Venous Leg  Ulcer Nursing Diagnoses: Actual venous Insuffiency (use after diagnosis is confirmed) Knowledge deficit related to disease process and management Goals: Patient will maintain optimal edema control Date Initiated: 01/02/2022 Target Resolution Date: 06/09/2022 Goal Status: Active Patient/caregiver will verbalize understanding of disease process and disease management Date Initiated: 01/02/2022 Date Inactivated: 03/07/2022 Target Resolution Date: 03/09/2022 Goal Status: Met Interventions: Assess peripheral edema status every visit. Compression as ordered Provide education on venous insufficiency Treatment Activities: Non-invasive vascular studies : 01/02/2022 T ordered outside of clinic : 01/02/2022 est Therapeutic compression applied : 01/02/2022 Notes: Wound/Skin Impairment Nursing Diagnoses: Impaired tissue integrity Knowledge deficit related to ulceration/compromised skin integrity Goals: Patient/caregiver will verbalize understanding of skin care regimen Date Initiated: 01/02/2022 Date Inactivated: 02/02/2022 Target Resolution Date: 02/03/2022 Goal Status: Met Ulcer/skin breakdown will have a volume reduction of 30% by week 4 Date Initiated: 01/02/2022 Target Resolution Date: 06/09/2022 Goal Status: Active Interventions: Assess ulceration(s) every visit Provide education on ulcer and skin care Treatment Activities: Skin care regimen initiated : 01/02/2022 Topical wound management initiated : 01/02/2022 Notes: Electronic Signature(s) Signed: 06/01/2022 4:28:57 PM By: Adline Peals Entered By: Pollie Friar  Lovena Le on 06/01/2022 09:59:20 THAYDEN, LEMIRE ([id number]) [id number] Page 5 of 8 -------------------------------------------------------------------------------- Pain Assessment Details Patient Name: Date of Service: Anthony Barnes Anthony E. 06/01/2022 9:15 A M Medical Record Number: [id number] Patient Account Number: [id number] Date of  Birth/Sex: Treating RN: 01-08-1956 (66 y.o. Anthony Barnes Primary Care Marykathryn Carboni: Charlott Rakes Other Clinician: Referring Lorene Klimas: Treating Calem Cocozza/Extender: Anthony Barnes Weeks in Treatment: 21 Active Problems Location of Pain Severity and Description of Pain Patient Has Paino No Site Locations Rate the pain. Current Pain Level: 0 Pain Management and Medication Current Pain Management: Electronic Signature(s) Signed: 06/01/2022 4:28:57 PM By: Adline Peals Entered By: Adline Peals on 06/01/2022 09:39:23 -------------------------------------------------------------------------------- Patient/Caregiver Education Details Patient Name: Date of Service: Anthony Floor, [name] Anthony E. 10/19/2023andnbsp9:15 A M Medical Record Number: [id number] Patient Account Number: [id number] Date of Birth/Gender: Treating RN: [birth year] (66 y.o. Anthony Barnes Primary Care Physician: Charlott Rakes Other Clinician: Referring Physician: Treating Physician/Extender: Ramon Dredge in Treatment: 21 Education Assessment Education Provided To: Patient Education Topics Provided Wound/Skin Impairment: Methods: Explain/Verbal DONTEE, JASO ([id number]) [id number] Page 6 of 8 Responses: Reinforcements needed, State content correctly Electronic Signature(s) Signed: 06/01/2022 4:28:57 PM By: Adline Peals Entered By: Adline Peals on 06/01/2022 09:59:31 -------------------------------------------------------------------------------- Wound Assessment Details Patient Name: Date of Service: Anthony Barnes Anthony E. 06/01/2022 9:15 A M Medical Record Number: [id number] Patient Account Number: [id number] Date of Birth/Sex: Treating RN: 1956/05/30 (66 y.o. Anthony Barnes Primary Care Gertrude Tarbet: Charlott Rakes Other Clinician: Referring Sheniya Garciaperez: Treating Thaniel Coluccio/Extender: Anthony Barnes Weeks in Treatment: 21 Wound Status Wound Number: 2 Primary Etiology: Venous Leg Ulcer Wound Location: Right, Lateral Lower Leg Wound Status: Open Wounding Event: Trauma Comorbid History: Hypertension, Type II Diabetes Date Acquired: 12/03/2021 Weeks Of Treatment: 21 Clustered Wound: No Photos Wound Measurements Length: (cm) 4 Width: (cm) 1.1 Depth: (cm) 0.1 Area: (cm) 3.456 Volume: (cm) 0.346 % Reduction in Area: -226% % Reduction in Volume: -226.4% Epithelialization: Small (1-33%) Tunneling: No Undermining: No Wound Description Classification: Full Thickness Without Exposed Support Structures Wound Margin: Distinct, outline attached Exudate Amount: Medium Exudate Type: Serosanguineous Exudate Color: red, brown Foul Odor After Cleansing: No Slough/Fibrino Yes Wound Bed Granulation Amount: Large (67-100%) Exposed Structure Granulation Quality: Red Fascia Exposed: No Necrotic Amount: Small (1-33%) Fat Layer (Subcutaneous Tissue) Exposed: Yes Necrotic Quality: Adherent Slough Tendon Exposed: No Muscle Exposed: No Joint Exposed: No Bone Exposed: No Periwound Skin Texture Texture Color No Abnormalities Noted: Yes No Abnormalities Noted: Yes Moisture Temperature / Pain CRANSTON, KOORS E ([id number]) [id number] Page 7 of 8 No Abnormalities Noted: No Temperature: No Abnormality Dry / Scaly: Yes Treatment Notes Wound #2 (Lower Leg) Wound Laterality: Right, Lateral Cleanser Soap and Water Discharge Instruction: May shower and wash wound with dial antibacterial soap and water prior to dressing change. Wound Cleanser Discharge Instruction: Cleanse the wound with wound cleanser prior to applying a clean dressing using gauze sponges, not tissue or cotton balls. Peri-Wound Care Triamcinolone 15 (g) Discharge Instruction: Use triamcinolone 15 (g) as directed Sween Lotion (Moisturizing lotion) Discharge Instruction: Apply moisturizing  lotion as directed Topical Skintegrity Hydrogel 4 (oz) Discharge Instruction: Apply hydrogel as directed Primary Dressing Hydrofera Blue Ready Foam, 4x5 in Discharge Instruction: Apply to wound bed as instructed Secondary Dressing Woven Gauze Sponge, Non-Sterile 4x4 in Discharge Instruction: Apply over primary dressing as directed. Secured With 60M Medipore H Soft Cloth Surgical T ape, 4 x 10 (in/yd) Discharge Instruction: Secure with tape as directed. Compression Wrap ThreePress (  3 layer compression wrap) Discharge Instruction: Apply three layer compression as directed. Compression Stockings Add-Ons Electronic Signature(s) Signed: 06/01/2022 4:28:57 PM By: Adline Peals Signed: 06/01/2022 5:03:48 PM By: Sharyn Creamer RN, BSN Entered By: Sharyn Creamer on 06/01/2022 09:47:34 -------------------------------------------------------------------------------- [place] Details Patient Name: Date of Service: Anthony Floor, [name] Anthony E. 06/01/2022 9:15 A M Medical Record Number: [id number] Patient Account Number: [id number] Date of Birth/Sex: Treating RN: 09-Sep-1955 (66 y.o. Anthony Barnes Primary Care Nivea Wojdyla: Charlott Rakes Other Clinician: Referring Jerah Esty: Treating Arienna Benegas/Extender: Anthony Barnes Weeks in Treatment: 21 Vital Signs Time Taken: 09:39 Temperature (F): 97.9 Height (in): 75 Pulse (bpm): 64 Weight (lbs): 310 Respiratory Rate (breaths/min): 18 Body Mass Index (BMI): 38.7 Blood Pressure (mmHg): 150/85 Reference Range: 80 - 120 mg / dl SHERROD, TOOTHMAN ([id number]) [id number] Page 8 of 8 Electronic Signature(s) Signed: 06/01/2022 4:28:57 PM By: Adline Peals Entered By: Adline Peals on 06/01/2022 09:39:17

## 2022-06-01 NOTE — Progress Notes (Signed)
Anthony Barnes ([id number]) [id number] Page 1 of 10 Visit Report for 06/01/2022 Debridement Details Patient Name: Date of Service: Anthony Barnes [name] 06/01/2022 9:15 A M Medical Record Number: [id number] Patient Account Number: [id number] Date of Birth/Sex: Treating RN: [birth year] (66 y.o. M) Primary Care Provider: Hoy Barnes Other Clinician: Referring Provider: Treating Provider/Extender: Anthony Barnes in Treatment: 21 Debridement Performed for Assessment: Wound #2 Right,Lateral Lower Leg Performed By: Physician Anthony Caul., MD Debridement Type: Debridement Severity of Tissue Pre Debridement: Fat layer exposed Level of Consciousness (Pre-procedure): Awake and Alert Pre-procedure Verification/Time Out Yes - 09:56 Taken: Start Time: 09:56 Pain Control: Lidocaine 5% topical ointment T Area Debrided (L x W): otal 4 (cm) x 1.1 (cm) = 4.4 (cm) Tissue and other material debrided: Non-Viable, Eschar, Slough, Slough Level: Non-Viable Tissue Debridement Description: Selective/Open Wound Instrument: Curette Bleeding: Minimum Hemostasis Achieved: Pressure Response to Treatment: Procedure was tolerated well Level of Consciousness (Post- Awake and Alert procedure): Post Debridement Measurements of Total Wound Length: (cm) 4 Width: (cm) 1.1 Depth: (cm) 0.1 Volume: (cm) 0.346 Character of Wound/Barnes Post Debridement: Improved Severity of Tissue Post Debridement: Fat layer exposed Post Procedure Diagnosis Same as Pre-procedure Notes scribed for Dr. Leanord Barnes by Anthony Bruin, RN Electronic Signature(s) Signed: 06/01/2022 4:29:29 PM By: Anthony Najjar MD Entered By: Anthony Barnes on 06/01/2022 10:08:09 -------------------------------------------------------------------------------- HPI Details Patient Name: Date of Service: Anthony Barnes, [name] 06/01/2022 9:15 A M Medical Record Number: [id number] Patient  Account Number: [id number] Date of Birth/Sex: Treating RN: [birth year] (66 y.o. M) Primary Care Provider: Hoy Barnes Other Clinician: Referring Provider: Treating Provider/Extender: Anthony Barnes in Treatment: 21 History of Present Illness Anthony Barnes (66) [id number] Page 2 of 10 HPI Description: ADMISSION 03/12/18 This is a 66 year old and it was a type II diabetic reasonably poorly controlled with a recent hemoglobin A1c of 8.2. He was tending to his lawn on 01/15/18 when his weed Anthony Barnes sent a rock up word hitting him in the right anterior leg. He was seen in his primary M.D. office is same time. An x-ray showed no abnormality. He was given a course of cephalexin. Since then he's been applying peroxide and topical antibiotics to the wound. He does not have a history of chronic wounds however he does have chronic edema in the lower legs. He is complaining of pain in the right leg wound but no real history of claudication. Past medical history includes type 2 diabetes, hypertension, morbid obesity. ABIs in the right leg were noncompressible 03/19/18 on evaluation today patient appears to be tolerating the wrap in general fairly well. He states he's not having that much pain in regard to the wound itself. With that being said he is having some issues with slough buildup on the surface of the wound the Iodoflex does seem to be helpful in that regard. He is still having discomfort he wonders if I can send in a prescription for ibuprofen or something of like to help him out. I do believe that the prox end may be of benefit for him. 03/25/18 on evaluation today patient appears to be doing very well in regard to his right lateral lower Anthony Barnes. He's been tolerating the dressing changes without complication that is the wraps. Nonetheless he does continue to have pain he states is been dreading the possibility of having  to have debridement again today. His first debridement experience was not optimal. With that being said he does seem to  be showing signs of improvement there does appear to be little bit more granulation he does have a lot of slough that really does need to breeding away. 04/04/18;the patient went for arterial studies that were really quite normal. ABI on the right at 1.19 with triphasic waveforms on the left 1.11 with triphasic waveforms. TBI 0.93 on the right and 0.95 on the left. This suggests T should be able to tolerate even 4 layer compression. He is however complaining of a lot of pain with our wraps I'm wondering whether the pain is simply Iodoflex. I changed him to [employer] I'm still going to try to keep him in 3 layer compression 04/12/18 on evaluation today patient actually appears to be doing rather well in regard to his right lower Anthony Barnes. He is making good progress although it is slow still I do believe that the Santyl is much better than the Iodoflex. The good news is the patient seems to be tolerating the dressing changes without complication now that we've gotten good of the Iodoflex which really did burn him quite significantly. Otherwise there's no evidence of infection. 04/17/18 on evaluation today patient actually appears to be showing signs of improvement in regard to the Barnes. Unfortunately he's had somewhat of a rough day due to the fact that he found out that one of his friends suddenly and unexpectedly passed today. He states he's not sure he's in the frame of mind to allow me to debride the wound at this point. Nonetheless I do feel like he is showing signs of the wound getting better little by little each week. 04/24/18 on evaluation today patient's Barnes actually appears to be showing some signs of improvement albeit slow. He has been tolerating the dressing changes without complication except for the wrap which she states was so tight that he had  to remove it it was causing a lot of discomfort. Fortunately there is no evidence of infection at this point. He states he only wore the wrap until about the time he got home last week and that he had to take it off. Nonetheless he does not have any other compression stockings, Juxta-Lite wraps, or anything otherwise to really help at this point as far as compression is concerned. 05/01/18 on evaluation today patient actually appears to be doing fairly well in regard to his lower extremity Barnes. He has been tolerating the dressing changes currently without complication. The wrap does seem to be helping as far as the fluid management is concerned. Wound bed itself actually show signs of good improvement although there is some Slough noted there's not as much as previous and he actually has fairly decent granulation noted as well. Overall I'm pleased with the progress of to this point. The patient would prefer not to have any debridement today he states he's actually not feeling too well in general and he really does not want any additional discomfort typically debridement is fairly uncomfortable for him. 05/08/18 on evaluation today patient actually appears to be doing a little better in regard to his lower extremity Barnes. He has been tolerating the dressing changes without complication. With that being said I'm actually quite pleased with the fact the wound is not appear to be infected and in general has made a little progress. With that being said I do believe we need to perform some debridement to clean away the necrotic tissue on the surface of the wound. 05/15/18 on evaluation today patient actually appears to be  doing a little better in regard to his wound. Fortunately there is some improvement noted fortunately there's also no significant evidence of infection at this point in time. He does have continued pain that really nothing different than previous he did get his Juxta-Lite wrap. 05/29/18 on  evaluation today patient actually appears to be doing somewhat better in regard to his wound currently. He's been tolerating the dressing changes without complication. With that being said he has been performing the [facility] Dressing changes every day at home. I'm pretty sure that we told him every other day last week or when I saw him rather two Barnes ago. With that being said obviously did not hurt to do it daily just that obviously is gonna run out of supplies sooner and that could get him into trouble as far as his insurance is concerned. Nonetheless he at this point still continues to have pain although honestly I don't feel like it's as bad as what it was in the past just based on his reactions at this point. 06/12/18 on evaluation today patient actually appears to be doing better in regard to his lower Anthony Barnes. He's been tolerating the dressing changes without complication. Fortunately there does not appear to be any evidence of infection at this time. No fevers, chills, nausea, or vomiting noted at this time. 06/19/18 evaluation today patient's Barnes on the lower extremity appears to be doing better at this point. he has been tolerating the dressing changes. The patient seems to be somewhat depressed about the progress of his wound although the overall appearance at this time seems to be doing well. In general I'm very pleased with the appearance today he does have some biofilm on the surface of the wound as well as of hyper granular tissue we may want to utilize a little bit of silver nitrate today. 06/27/2018; patient comes into clinic today for a wound on the right lateral calf likely related to chronic venous insufficiency. He has been using medihoney covered with Hydrofera Blue. Wound surface actually looks quite good 07/03/2018 seen today for follow-up and management of lower extremity Barnes right lateral shin. T olerating dressing changes. He has a small amount of  biofilm today. Will attempt to remove a portion of the biofilm as he tolerates. He becomes very anxious with wound treatments. He would benefit from layer compression wraps however he refuses compression wraps or anything that smokes his legs. He expressed an inability to tolerate compression wraps. Juxta lite wraps have been ordered. He has been instructed on the appropriate application of the juxta leg wraps. His blood pressure today on visit was 200/120. He denies any blurred vision, chest pain, dizziness, shortness of breath, or difficulty with mobility. Mr. Poyser stated that he had a recent visit with his primary care provider. At that time his carvedilol was decreased from 25 mg daily to 12.5 mg daily. Strongly encouraged Mr. Dutter to seek medical attention today during visit. He declined transport for evaluation of elevated blood pressure. Encouraged him to contact his primary care provider today for medication management. He stated that he would call his primary care doctor after wound visit today. Also encouraged him that if he experienced any symptoms of blurred vision, chest pain, shortness of breath to immediately seek medical attention. 07/17/18 on evaluation today patient's wound actually does seem to show signs of improvement based on the overall appearance of the wound bed today. There does not appear to show any signs of infection which is  good news. There is no overall worsening which is also good news. He still has hyper granular tissue will use in the [employer] followed by [facility] Dressing this point. 07/31/18 on evaluation today patient's wound bed actually show signs of improvement with good epithelialization especially in the upper portion of the wound. He has been tolerating the dressing changes without complication in general. Overall I'm extremely happy with how things stand. No fevers, chills, nausea, or vomiting noted at this time. 08/28/18 on evaluation today  patient appears to be doing a little better in regard to his lower extremity Barnes. With that being said it appears to be very hyper granular I think this is directly attributed to the fact that he continues to use the [employer] underneath the [facility] Dressing. Although this helps not to stick unfortunately also think he's not getting the benefit of the [facility] Dressing particular and subsequently is causing too much moisture buildup TRISTIAN, SICKINGER ([id number]) [id number] Page 3 of 10 hence the hyper granulation. Fortunately there does not appear to be any signs of infection at this time which is good news. 09/03/18 on evaluation today patient appears to be doing well in regard to his lower Anthony Barnes. He has been tolerating the dressing changes without complication. Fortunately he has much less hyper granular tissue the noted last week I do think using silver nitrate in changing to using the [facility] Dressing without the mepitel has made a big difference for him this is good news. 09/18/18 on evaluation today patient appears to be doing excellent in regard to his right lower Anthony Barnes. This is actually significantly smaller even compared to the last evaluation. Overall I'm very pleased in this regard. I'm a recommend that we likely repeat the silver nitrate today based on what I'm seeing. 10/02/18 on evaluation today patient appears to be doing excellent in regard to his lower extremity wound on the right. He's been tolerating the dressing changes without complication. Fortunately there's no signs of infection at this time. Overall been very pleased with how things seem to be progressing. No fevers, chills, nausea, or vomiting noted at this time. 10/16/18 on evaluation today patient actually appears to be doing much better in regard to his lower extremity wound on the right. This is shown signs of good improvement and is indeed measuring smaller he is  on a excellent track as far as healing is concerned. My hope is this will be healed of the next several Barnes. Fortunately there is no evidence of infection at this time. He does seem to be doing everything that I'm recommending for him 10/30/18 on evaluation today patient appears to be doing better in regard to his lower extremity Barnes. He's been tolerating the dressing changes without complication. Fortunately the Hydrofera Blue Dressing to be doing the job. He has made excellent progress. With that being said this is still going somewhat slow but nonetheless is always making good progress at this point. 5/18-Patient was in to be seen for right leg Barnes that appeared after scab above it was removed today. This area had healed completely. 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 The wound has not healed though it has been nearly 2  months since the injury occurred. He was seen in the emergency department at [facility] on May 9. At that time it was very painful and he described it as a "15 on a scale of 010". He also was found to have 3+ pitting edema to the bilateral lower extremities. He was prescribed a Barnes course of Keflex. He is here today because the wound has failed to heal and he has a prior history in our clinic. ABI in clinic today was noncompressible. He did have formal vascular studies performed in 2019 which were normal. The last hemoglobin A1c I have available for review was from March 21, 2021 and was elevated at 7.7%. The wound is fairly small and circular located about 3 inches above his right lateral malleolus. It is completely covered with eschar. No surrounding erythema, no odor, no purulent  drainage. 01/11/2022: The wound on his right lateral leg is a little bit smaller today. It has reaccumulated some slough and a small bit of eschar. It remains fairly painful. He has another area on his anterior tibia that he will not let me touch but I am concerned that there is a wound forming underneath the surface. 01/18/2022: Unfortunately, his wound is a little bit bigger today. He continues to accumulate slough on the wound surface. It is still quite tender. 01/25/2022: The patient bumped his wound on the car door which resulted in bleeding. He was seen at urgent care where it was redressed. Unfortunately, the wound is bigger again today. There is accumulated slough on the surface. Urgent care gave him some tramadol, apparently and he took this prior to his visit so he could tolerate debridement better. 02/02/2022: The wound is a little bit smaller today. The surface, however, has accumulated a fairly thick layer of slough. The periwound skin is intact and there is no obvious sign of infection. 02/07/2022: The wound is a bit larger today. It has thick slough accumulation. The periwound skin is intact and there is no obvious erythema, induration, nor any odor. 02/16/2022: I took a culture last week due to the appearance of the wound and the patient's degree of pain. This grew out methicillin sensitive Staph aureus. Augmentin was prescribed. The patient states that the pain is markedly improved today. The wound also looks much better. It is smaller with less slough buildup and there is good granulation tissue emerging. 02/21/2022: The patient's pain is quite improved from prior visits. The wound is about the same size and has some accumulated slough on the surface. The base is a little bit fibrotic but there are some areas of granulation tissue filling in. 02/28/2022: The wound is a little bit narrower on its measurements but slightly longer. There is still some slough accumulation on the surface, but he  has minimal pain and there is no concern for ongoing infection. Granulation tissue is emerging. 03/07/2022: During the week, he cut holes in his wrap because it was painful and too tight. As a result, his leg was quite a bit more swollen today and his wound was larger. The wound surface is fairly clean but a little bit dry. Minimal slough accumulation. Granulation tissue present. 03/14/2022: Once again, he did some arts and crafts projects with his wrap. On the other hand, however, his wound does look better. It is smaller today and is beginning to fill in. Minimal slough and a small amount of eschar accumulation. 03/27/2022: His wound measured bigger today, although it does not appear to be so on my  impression. The wound continues to fill with granulation tissue. There is a little bit of slough and eschar accumulation. He continues to cut his wrap off of his feet and his edema control at the ankle is particularly poor with 3+ pitting edema. His insurance will not cover any sort of skin substitute unfortunately. 04/04/2022: The wound is smaller today. It is more superficial with good granulation tissue and just a little bit of slough. He continues to cut away portions of his wrap which results in inadequate edema control. 04/11/2022: His wound measured a couple millimeters larger today. It continues to fill with granulation tissue and there is minimal slough on the surface. 04/19/2022: No significant change in the wound dimensions, but the surface is healthier and less fibrotic. 04/26/2022: The wound is a little bit bigger again today. There is slough accumulation on the surface. Edema control is inadequate. He has been cutting the foot portion of his wrap off each week. 05/03/2022: The wound is a little smaller this week. There is still a fair amount of slough on the surface. His drainage was a little bit as greenish today, although not the blue-green typically associated with Pseudomonas. No significant  odor. The underlying granulation tissue appears healthy. The culture that I took last week grew out methicillin sensitive Staph aureus. 05/10/2022: The wound is again a little bit smaller this week. He still has slough accumulation on the surface. Most of the surface has fairly decent granulation tissue, but there is still a fibrotic portion in the center near the caudal aspect of the wound. He did not pick up the Augmentin that I prescribed for his MSSA infection. He says he will get it today. 05/19/2022: His wound is smaller by half a centimeter today. There is a little bit of slough buildup, but less so than at previous visits. He has not been taking his Augmentin properly. He has been splitting the tablets in half and taking them on an irregular schedule. He says this is because it has been upsetting his Anthony Barnes, Anthony Barnes ([id number]) [id number] Page 4 of 10 stomach. 05/25/2022: His wound is a little bit smaller again this week. Very minimal slough accumulation. The wound is drier than I would like to see. He completed his course of Augmentin. 10/19; patient has a wound on the right lateral lower leg in the setting of obvious chronic venous insufficiency. He has had a previous wound on the anterior leg that we dealt with several years ago. He has been using Hydrofera Blue. [employer] information centre manager) Signed: 06/01/2022 4:29:29 PM By: Anthony Najjar MD Entered By: Anthony Barnes on 06/01/2022 10:09:28 -------------------------------------------------------------------------------- Physical Exam Details Patient Name: Date of Service: Anthony Barnes, [name] 06/01/2022 9:15 A M Medical Record Number: [id number] Patient Account Number: [id number] Date of Birth/Sex: Treating RN: August 16, 1955 (66 y.o. M) Primary Care Provider: Hoy Barnes Other Clinician: Referring Provider: Treating Provider/Extender: Anthony Barnes in  Treatment: 21 Constitutional Patient is hypertensive.. Pulse regular and within target range for patient.Marland Kitchen Respirations regular, non-labored and within target range.. Temperature is normal and within the target range for the patient.Marland Kitchen Appears in no distress. Cardiovascular Pedal pulses are palpable at the dorsalis pedis posterior tibial. His capillary refill time is normal. Notes Wound exam; I do not think much change in dimension. The wound bed is dry with some surface slough. I debrided this with a #5 curette. Healthier looking after this. He does not have good edema control Electronic Signature(s) Signed: 06/01/2022 4:29:29  PM By: Linton Ham MD Entered By: Linton Ham on 06/01/2022 10:11:06 -------------------------------------------------------------------------------- Physician Orders Details Patient Name: Date of Service: [name], [name] 06/01/2022 9:15 A M Medical Record Number: [id number] Patient Account Number: [id number] Date of Birth/Sex: Treating RN: Jul 11, 1956 (66 y.o. [name] Primary Care Provider: Charlott Rakes Other Clinician: Referring Provider: Treating Provider/Extender: Sindy Guadeloupe Barnes in Treatment: 21 Verbal / Phone Orders: No Diagnosis Coding Follow-up Appointments ppointment in 1 week. - Dr. Celine Ahr - room 2 Return A Anesthetic Wound #2 Right,Lateral Lower Leg (In clinic) Topical Lidocaine 5% applied to wound bed Bathing/ Shower/ Hygiene May shower with protection but do not get wound dressing(s) wet. - may purchase a cast protector from [facility] or [facility] [place], [place] ([id number]) [phone].pdf Page 5 of 10 Edema Control - Lymphedema / SCD / Other Elevate legs to the level of the heart or above for 30 minutes daily and/or when sitting, a frequency of: Avoid standing for long periods of time. Patient to wear own compression stockings every day. Exercise regularly Moisturize  legs daily. Compression stocking or Garment 20-30 mm/Hg pressure to: - to L left until R leg is healed Wound Treatment Wound #2 - Lower Leg Wound Laterality: Right, Lateral Cleanser: Soap and Water 1 x Per Week/30 Days Discharge Instructions: May shower and wash wound with dial antibacterial soap and water prior to dressing change. Cleanser: Wound Cleanser 1 x Per Week/30 Days Discharge Instructions: Cleanse the wound with wound cleanser prior to applying a clean dressing using gauze sponges, not tissue or cotton balls. Peri-Wound Care: Triamcinolone 15 (g) 1 x Per Week/30 Days Discharge Instructions: Use triamcinolone 15 (g) as directed Peri-Wound Care: Sween Lotion (Moisturizing lotion) 1 x Per Week/30 Days Discharge Instructions: Apply moisturizing lotion as directed Topical: Skintegrity Hydrogel 4 (oz) 1 x Per Week/30 Days Discharge Instructions: Apply hydrogel as directed Prim Dressing: Hydrofera Blue Ready Foam, 4x5 in 1 x Per Week/30 Days ary Discharge Instructions: Apply to wound bed as instructed Secondary Dressing: Woven Gauze Sponge, Non-Sterile 4x4 in 1 x Per Week/30 Days Discharge Instructions: Apply over primary dressing as directed. Secured With: 52M Medipore H Soft Cloth Surgical T ape, 4 x 10 (in/yd) 1 x Per Week/30 Days Discharge Instructions: Secure with tape as directed. Compression Wrap: ThreePress (3 layer compression wrap) 1 x Per Week/30 Days Discharge Instructions: Apply three layer compression as directed. Patient Medications llergies: Shellfish Containing Products, codeine A Notifications Medication Indication Start End 06/01/2022 lidocaine DOSE topical 5 % ointment - ointment topical Electronic Signature(s) Signed: 06/01/2022 4:28:57 PM By: Adline Peals Signed: 06/01/2022 4:29:29 PM By: Linton Ham MD Entered By: Adline Peals on 06/01/2022 09:59:13 -------------------------------------------------------------------------------- Problem  List Details Patient Name: Date of Service: [name], [name] 06/01/2022 9:15 A M Medical Record Number: [id number] Patient Account Number: [id number] Date of Birth/Sex: Treating RN: September 27, 1955 (66 y.o. 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 01/02/2022 No Yes Anthony Barnes, Anthony Barnes ([id number]) [id number] Page 6 of 10 exposed E11.622 Type 2 diabetes mellitus with other skin Barnes 01/02/2022 No Yes E11.9 Type 2 diabetes mellitus without complications 6/54/6503 No Yes I10 Essential (primary) hypertension 01/02/2022 No Yes I87.2 Venous insufficiency (chronic) (peripheral) 01/02/2022 No Yes Inactive Problems Resolved Problems Electronic Signature(s) Signed: 06/01/2022 4:29:29 PM By: Linton Ham  MD Entered By: Anthony Barnes on 06/01/2022 10:07:55 -------------------------------------------------------------------------------- Progress Note Details Patient Name: Date of Service: Anthony Barnes [name] 06/01/2022 9:15 A M Medical Record Number: [id number] Patient Account Number: [id number] Date of Birth/Sex: Treating RN: 1956/01/15 (66 y.o. M) Primary Care Provider: Hoy Barnes Other Clinician: Referring Provider: Treating Provider/Extender: Anthony Barnes in Treatment: 21 Subjective History of Present Illness (HPI) ADMISSION 03/12/18 This is a 66 year old and it was a type II diabetic reasonably poorly controlled with a recent hemoglobin A1c of 8.2. He was tending to his lawn on 01/15/18 when his weed Anthony Barnes sent a rock up word hitting him in the right anterior leg. He was seen in his primary M.D. office is same time. An x-ray showed no abnormality. He  was given a course of cephalexin. Since then he's been applying peroxide and topical antibiotics to the wound. He does not have a history of chronic wounds however he does have chronic edema in the lower legs. He is complaining of pain in the right leg wound but no real history of claudication. Past medical history includes type 2 diabetes, hypertension, morbid obesity. ABIs in the right leg were noncompressible 03/19/18 on evaluation today patient appears to be tolerating the wrap in general fairly well. He states he's not having that much pain in regard to the wound itself. With that being said he is having some issues with slough buildup on the surface of the wound the Iodoflex does seem to be helpful in that regard. He is still having discomfort he wonders if I can send in a prescription for ibuprofen or something of like to help him out. I do believe that the prox end may be of benefit for him. 03/25/18 on evaluation today patient appears to be doing very well in regard to his right lateral lower Anthony Barnes. He's been tolerating the dressing changes without complication that is the wraps. Nonetheless he does continue to have pain he states is been dreading the possibility of having to have debridement again today. His first debridement experience was not optimal. With that being said he does seem to be showing signs of improvement there does appear to be little bit more granulation he does have a lot of slough that really does need to breeding away. 04/04/18;the patient went for arterial studies that were really quite normal. ABI on the right at 1.19 with triphasic waveforms on the left 1.11 with triphasic waveforms. TBI 0.93 on the right and 0.95 on the left. This suggests T should be able to tolerate even 4 layer compression. He is however complaining of a lot of pain with our wraps I'm wondering whether the pain is simply Iodoflex. I changed him to [employer]  I'm still going to try to keep him in 3 layer compression 04/12/18 on evaluation today patient actually appears to be doing rather well in regard to his right lower Anthony Barnes. He is making good progress although it is slow still I do believe that the Santyl is much better than the Iodoflex. The good news is the patient seems to be tolerating the dressing changes without complication now that we've gotten good of the Iodoflex which really did burn him quite significantly. Otherwise there's no evidence of infection. 04/17/18 on evaluation today patient actually appears to be showing signs of improvement in regard to the Barnes. Unfortunately he's had somewhat of a rough Anthony Barnes, Anthony Barnes ([id number]) [id number] Page 7 of 10 day due to  the fact that he found out that one of his friends suddenly and unexpectedly passed today. He states he's not sure he's in the frame of mind to allow me to debride the wound at this point. Nonetheless I do feel like he is showing signs of the wound getting better little by little each week. 04/24/18 on evaluation today patient's Barnes actually appears to be showing some signs of improvement albeit slow. He has been tolerating the dressing changes without complication except for the wrap which she states was so tight that he had to remove it it was causing a lot of discomfort. Fortunately there is no evidence of infection at this point. He states he only wore the wrap until about the time he got home last week and that he had to take it off. Nonetheless he does not have any other compression stockings, Juxta-Lite wraps, or anything otherwise to really help at this point as far as compression is concerned. 05/01/18 on evaluation today patient actually appears to be doing fairly well in regard to his lower extremity Barnes. He has been tolerating the dressing changes currently without complication. The wrap does seem to be helping as far as the fluid  management is concerned. Wound bed itself actually show signs of good improvement although there is some Slough noted there's not as much as previous and he actually has fairly decent granulation noted as well. Overall I'm pleased with the progress of to this point. The patient would prefer not to have any debridement today he states he's actually not feeling too well in general and he really does not want any additional discomfort typically debridement is fairly uncomfortable for him. 05/08/18 on evaluation today patient actually appears to be doing a little better in regard to his lower extremity Barnes. He has been tolerating the dressing changes without complication. With that being said I'm actually quite pleased with the fact the wound is not appear to be infected and in general has made a little progress. With that being said I do believe we need to perform some debridement to clean away the necrotic tissue on the surface of the wound. 05/15/18 on evaluation today patient actually appears to be doing a little better in regard to his wound. Fortunately there is some improvement noted fortunately there's also no significant evidence of infection at this point in time. He does have continued pain that really nothing different than previous he did get his Juxta-Lite wrap. 05/29/18 on evaluation today patient actually appears to be doing somewhat better in regard to his wound currently. He's been tolerating the dressing changes without complication. With that being said he has been performing the [facility] Dressing changes every day at home. I'm pretty sure that we told him every other day last week or when I saw him rather two Barnes ago. With that being said obviously did not hurt to do it daily just that obviously is gonna run out of supplies sooner and that could get him into trouble as far as his insurance is concerned. Nonetheless he at this point still continues to have pain although honestly  I don't feel like it's as bad as what it was in the past just based on his reactions at this point. 06/12/18 on evaluation today patient actually appears to be doing better in regard to his lower Anthony Barnes. He's been tolerating the dressing changes without complication. Fortunately there does not appear to be any evidence of infection at this time. No fevers, chills,  nausea, or vomiting noted at this time. 06/19/18 evaluation today patient's Barnes on the lower extremity appears to be doing better at this point. he has been tolerating the dressing changes. The patient seems to be somewhat depressed about the progress of his wound although the overall appearance at this time seems to be doing well. In general I'm very pleased with the appearance today he does have some biofilm on the surface of the wound as well as of hyper granular tissue we may want to utilize a little bit of silver nitrate today. 06/27/2018; patient comes into clinic today for a wound on the right lateral calf likely related to chronic venous insufficiency. He has been using medihoney covered with Hydrofera Blue. Wound surface actually looks quite good 07/03/2018 seen today for follow-up and management of lower extremity Barnes right lateral shin. T olerating dressing changes. He has a small amount of biofilm today. Will attempt to remove a portion of the biofilm as he tolerates. He becomes very anxious with wound treatments. He would benefit from layer compression wraps however he refuses compression wraps or anything that smokes his legs. He expressed an inability to tolerate compression wraps. Juxta lite wraps have been ordered. He has been instructed on the appropriate application of the juxta leg wraps. His blood pressure today on visit was 200/120. He denies any blurred vision, chest pain, dizziness, shortness of breath, or difficulty with mobility. Mr. Furry stated that he had a recent visit with his primary care provider.  At that time his carvedilol was decreased from 25 mg daily to 12.5 mg daily. Strongly encouraged Mr. Indelicato to seek medical attention today during visit. He declined transport for evaluation of elevated blood pressure. Encouraged him to contact his primary care provider today for medication management. He stated that he would call his primary care doctor after wound visit today. Also encouraged him that if he experienced any symptoms of blurred vision, chest pain, shortness of breath to immediately seek medical attention. 07/17/18 on evaluation today patient's wound actually does seem to show signs of improvement based on the overall appearance of the wound bed today. There does not appear to show any signs of infection which is good news. There is no overall worsening which is also good news. He still has hyper granular tissue will use in the [employer] followed by [facility] Dressing this point. 07/31/18 on evaluation today patient's wound bed actually show signs of improvement with good epithelialization especially in the upper portion of the wound. He has been tolerating the dressing changes without complication in general. Overall I'm extremely happy with how things stand. No fevers, chills, nausea, or vomiting noted at this time. 08/28/18 on evaluation today patient appears to be doing a little better in regard to his lower extremity Barnes. With that being said it appears to be very hyper granular I think this is directly attributed to the fact that he continues to use the [employer] underneath the [facility] Dressing. Although this helps not to stick unfortunately also think he's not getting the benefit of the [facility] Dressing particular and subsequently is causing too much moisture buildup hence the hyper granulation. Fortunately there does not appear to be any signs of infection at this time which is good news. 09/03/18 on evaluation today patient appears to be doing well in regard to  his lower Anthony Barnes. He has been tolerating the dressing changes without complication. Fortunately he has much less hyper granular tissue the noted last week I  do think using silver nitrate in changing to using the [facility] without the mepitel has made a big difference for him this is good news. 09/18/18 on evaluation today patient appears to be doing excellent in regard to his right lower Anthony Barnes. This is actually significantly smaller even compared to the last evaluation. Overall I'm very pleased in this regard. I'm a recommend that we likely repeat the silver nitrate today based on what I'm seeing. 10/02/18 on evaluation today patient appears to be doing excellent in regard to his lower extremity wound on the right. He's been tolerating the dressing changes without complication. Fortunately there's no signs of infection at this time. Overall been very pleased with how things seem to be progressing. No fevers, chills, nausea, or vomiting noted at this time. 10/16/18 on evaluation today patient actually appears to be doing much better in regard to his lower extremity wound on the right. This is shown signs of good improvement and is indeed measuring smaller he is on a excellent track as far as healing is concerned. My hope is this will be healed of the next several Barnes. Fortunately there is no evidence of infection at this time. He does seem to be doing everything that I'm recommending for him 10/30/18 on evaluation today patient appears to be doing better in regard to his lower extremity Barnes. He's been tolerating the dressing changes without complication. Fortunately the Hydrofera Blue Dressing to be doing the job. He has made excellent progress. With that being said this is still going somewhat slow but nonetheless is always making good progress at this point. 5/18-Patient was in to be seen for right leg Barnes that appeared after scab above it was removed today. This area had  healed completely. 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 The wound has not healed though it has been nearly 2 months since the injury occurred. [facility] May [facility]. At that time it was very painful and he described it as a "15 on a scale of 0oo10". He also was found to have 3+ pitting edema to the bilateral lower extremities. He was prescribed a Barnes course of Keflex. He is here today because the wound has failed to heal and he has a prior history in our clinic. ABI in clinic today was noncompressible. He did have formal vascular studies performed in 2019 which were normal. The last hemoglobin A1c I have available Anthony Barnes, Anthony Barnes ([id number]) [phone] [id number] Page 8 of 10 for review was from March 21, 2021 and was elevated at 7.7%. The wound is fairly small and circular located about 3 inches above his right lateral malleolus. It is completely covered with eschar. No surrounding erythema, no odor, no purulent drainage. 01/11/2022: The wound on his right lateral leg is a little bit smaller today. It has reaccumulated some slough and a small bit of eschar. It remains fairly painful. He has another area on his anterior tibia that he will not let me touch but I am concerned that there is a wound forming underneath the surface. 01/18/2022: Unfortunately, his wound is a little bit bigger today. He continues to accumulate slough on the wound surface. It  is still quite tender. 01/25/2022: The patient bumped his wound on the car door which resulted in bleeding. He was seen at urgent care where it was redressed. Unfortunately, the wound is bigger again today. There  is accumulated slough on the surface. Urgent care gave him some tramadol, apparently and he took this prior to his visit so he could tolerate debridement better. 02/02/2022: The wound is a little bit smaller today. The surface, however, has accumulated a fairly thick layer of slough. The periwound skin is intact and there is no obvious sign of infection. 02/07/2022: The wound is a bit larger today. It has thick slough accumulation. The periwound skin is intact and there is no obvious erythema, induration, nor any odor. 02/16/2022: I took a culture last week due to the appearance of the wound and the patient's degree of pain. This grew out methicillin sensitive Staph aureus. Augmentin was prescribed. The patient states that the pain is markedly improved today. The wound also looks much better. It is smaller with less slough buildup and there is good granulation tissue emerging. 02/21/2022: The patient's pain is quite improved from prior visits. The wound is about the same size and has some accumulated slough on the surface. The base is a little bit fibrotic but there are some areas of granulation tissue filling in. 02/28/2022: The wound is a little bit narrower on its measurements but slightly longer. There is still some slough accumulation on the surface, but he has minimal pain and there is no concern for ongoing infection. Granulation tissue is emerging. 03/07/2022: During the week, he cut holes in his wrap because it was painful and too tight. As a result, his leg was quite a bit more swollen today and his wound was larger. The wound surface is fairly clean but a little bit dry. Minimal slough accumulation. Granulation tissue present. 03/14/2022: Once again, he did some arts and crafts projects with his wrap. On the other hand, however, his wound does look better. It is smaller today and is beginning to fill in. Minimal slough and a small amount of eschar accumulation. 03/27/2022: His wound measured bigger  today, although it does not appear to be so on my impression. The wound continues to fill with granulation tissue. There is a little bit of slough and eschar accumulation. He continues to cut his wrap off of his feet and his edema control at the ankle is particularly poor with 3+ pitting edema. His insurance will not cover any sort of skin substitute unfortunately. 04/04/2022: The wound is smaller today. It is more superficial with good granulation tissue and just a little bit of slough. He continues to cut away portions of his wrap which results in inadequate edema control. 04/11/2022: His wound measured a couple millimeters larger today. It continues to fill with granulation tissue and there is minimal slough on the surface. 04/19/2022: No significant change in the wound dimensions, but the surface is healthier and less fibrotic. 04/26/2022: The wound is a little bit bigger again today. There is slough accumulation on the surface. Edema control is inadequate. He has been cutting the foot portion of his wrap off each week. 05/03/2022: The wound is a little smaller this week. There is still a fair amount of slough on the surface. His drainage was a little bit as greenish today, although not the blue-green typically associated with Pseudomonas. No significant odor. The underlying granulation tissue appears healthy. The culture that I took last week grew out methicillin sensitive Staph aureus.  05/10/2022: The wound is again a little bit smaller this week. He still has slough accumulation on the surface. Most of the surface has fairly decent granulation tissue, but there is still a fibrotic portion in the center near the caudal aspect of the wound. He did not pick up the Augmentin that I prescribed for his MSSA infection. He says he will get it today. 05/19/2022: His wound is smaller by half a centimeter today. There is a little bit of slough buildup, but less so than at previous visits. He has not been taking  his Augmentin properly. He has been splitting the tablets in half and taking them on an irregular schedule. He says this is because it has been upsetting his stomach. 05/25/2022: His wound is a little bit smaller again this week. Very minimal slough accumulation. The wound is drier than I would like to see. He completed his course of Augmentin. 10/19; patient has a wound on the right lateral lower leg in the setting of obvious chronic venous insufficiency. He has had a previous wound on the anterior leg that we dealt with several years ago. He has been using Hydrofera Blue. Kerlix and Coban Objective Constitutional Patient is hypertensive.. Pulse regular and within target range for patient.Marland Kitchen Respirations regular, non-labored and within target range.. Temperature is normal and within the target range for the patient.Marland Kitchen Appears in no distress. Vitals Time Taken: 9:39 AM, Height: 75 in, Weight: 310 lbs, BMI: 38.7, Temperature: 97.9 F, Pulse: 64 bpm, Respiratory Rate: 18 breaths/min, Blood Pressure: 150/85 mmHg. Cardiovascular Pedal pulses are palpable at the dorsalis pedis posterior tibial. His capillary refill time is normal. General Notes: Wound exam; I do not think much change in dimension. The wound bed is dry with some surface slough. I debrided this with a #5 curette. Healthier looking after this. He does not have good edema control Anthony Barnes, Anthony Barnes ([id number]) [id number] Page 9 of 10 Integumentary (Hair, Skin) Wound #2 status is Open. Original cause of wound was Trauma. The date acquired was: 12/03/2021. The wound has been in treatment 21 Barnes. The wound is located on the Right,Lateral Lower Leg. The wound measures 4cm length x 1.1cm width x 0.1cm depth; 3.456cm^2 area and 0.346cm^3 volume. There is Fat Layer (Subcutaneous Tissue) exposed. There is no tunneling or undermining noted. There is a medium amount of serosanguineous drainage noted. The wound margin  is distinct with the outline attached to the wound base. There is large (67-100%) red granulation within the wound bed. There is a small (1-33%) amount of necrotic tissue within the wound bed including Adherent Slough. The periwound skin appearance had no abnormalities noted for texture. The periwound skin appearance had no abnormalities noted for color. The periwound skin appearance exhibited: Dry/Scaly. Periwound temperature was noted as No Abnormality. 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 There was a Selective/Open Wound Non-Viable Tissue Debridement with a total area of 4.4 sq cm performed by Anthony Caul., MD. With the following instrument(s): Curette to remove Non-Viable tissue/material. Material removed includes Eschar and Slough and after achieving pain control using Lidocaine 5% topical ointment. No specimens were taken. A time out was conducted  at 09:56, prior to the start of the procedure. A Minimum amount of bleeding was controlled with Pressure. The procedure was tolerated well. Post Debridement Measurements: 4cm length x 1.1cm width x 0.1cm depth; 0.346cm^3 volume. Character of Wound/Barnes Post Debridement is improved. Severity of Tissue Post Debridement is: Fat layer exposed. Post procedure Diagnosis Wound #2: Same as Pre-Procedure General Notes: scribed for DLeanord HawkingRobson by Anthony Bruin, RN. 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 There was a Three Layer Compression Therapy Procedure by  Anthony Bruin, RN. Post procedure Diagnosis Wound #2: Same as Pre-Procedure Plan Follow-up Appointments: Return Appointment in 1 week. - Dr. Lady Gary - room 2 Anesthetic: Wound #2 Right,Lateral Lower Leg: (In clinic) Topical Lidocaine 5% applied to wound bed Bathing/ Shower/ Hygiene: May shower with protection but do not get wound dressing(s) wet. - may purchase a cast protector from [facility] or [facility] Edema Control - Lymphedema / SCD / Other: Elevate legs to the level of the heart or above for 30 minutes daily and/or when sitting, a frequency of: Avoid standing for long periods of time. Patient to wear own compression stockings every day. Exercise regularly Moisturize legs daily. Compression stocking or Garment 20-30 mm/Hg pressure to: - to L left until R leg is healed The following medication(s) was prescribed: lidocaine topical 5 % ointment ointment topical was prescribed at facility WOUND #2: - Lower Leg Wound Laterality: Right, Lateral Cleanser: Soap and Water 1 x Per Week/30 Days Discharge Instructions: May shower and wash wound with dial antibacterial soap and water prior to dressing change. Cleanser: Wound Cleanser 1 x Per Week/30 Days Discharge Instructions: Cleanse the wound with wound cleanser prior to applying a clean dressing using gauze sponges, not tissue or cotton balls. Peri-Wound Care: Triamcinolone 15 (g) 1 x Per Week/30 Days Discharge Instructions: Use triamcinolone 15 (g) as directed Peri-Wound Care: Sween Lotion (Moisturizing lotion) 1 x Per Week/30 Days Discharge Instructions: Apply moisturizing lotion as directed Topical: Skintegrity Hydrogel 4 (oz) 1 x Per Week/30 Days Discharge Instructions: Apply hydrogel as directed Prim Dressing: Hydrofera Blue Ready Foam, 4x5 in 1 x Per Week/30 Days ary Discharge Instructions: Apply to wound bed as instructed Secondary Dressing: Woven Gauze Sponge, Non-Sterile 4x4 in 1 x Per Week/30 Days Discharge Instructions: Apply  over primary dressing as directed. Secured With: 16M Medipore H Soft Cloth Surgical T ape, 4 x 10 (in/yd) 1 x Per Week/30 Days Discharge Instructions: Secure with tape as directed. Com pression Wrap: ThreePress (3 layer compression wrap) 1 x Per Week/30 Days Discharge Instructions: Apply three layer compression as directed. Anthony Barnes, Anthony Barnes ([id number]) [id number] Page 10 of 10 1. Light debridement as noted. I continued with the Hydrofera Blue and hydrogel that he they have been using 2. Because of the pitting edema around the wound I used 3 layer instead of 2 layer compression. Although his ABIs were noncompressible, he had good pulses in his feet good capillary refill I think he should be able to tolerate this. I have asked him to call us if there is a problem he has pain he can take it off and call us in the morning Electronic Signature(s) Signed: 06/01/2022 4:28:57 PM By: Anthony Barnes Signed: 06/01/2022 4:29:29 PM By: Anthony Najjar MD Entered By: Anthony Barnes on 06/01/2022 12:27:45 -------------------------------------------------------------------------------- SuperBill Details Patient Name: Date of Service: Anthony Barnes, [name] 06/01/2022 Medical Record Number: [id number] Patient Account Number: [id number] Date of Birth/Sex: Treating RN: 1956/04/24 (66 y.o.  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 E11.9 Type 2 diabetes mellitus without complications I10 Essential (primary) hypertension I87.2 Venous insufficiency (chronic) (peripheral) Facility Procedures : CPT4 Code: [id number] Description: 19147 - DEBRIDE WOUND 1ST 20 SQ CM  OR < ICD-10 Diagnosis Description L97.812 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

## 2022-06-08 ENCOUNTER — Encounter (HOSPITAL_BASED_OUTPATIENT_CLINIC_OR_DEPARTMENT_OTHER): Payer: Medicare Other | Admitting: General Surgery

## 2022-06-08 DIAGNOSIS — Z6838 Body mass index (BMI) 38.0-38.9, adult: Secondary | ICD-10-CM | POA: Diagnosis not present

## 2022-06-08 DIAGNOSIS — L97812 Non-pressure chronic ulcer of other part of right lower leg with fat layer exposed: Secondary | ICD-10-CM | POA: Diagnosis not present

## 2022-06-08 DIAGNOSIS — E11622 Type 2 diabetes mellitus with other skin ulcer: Secondary | ICD-10-CM | POA: Diagnosis not present

## 2022-06-08 DIAGNOSIS — I872 Venous insufficiency (chronic) (peripheral): Secondary | ICD-10-CM | POA: Diagnosis not present

## 2022-06-08 DIAGNOSIS — I10 Essential (primary) hypertension: Secondary | ICD-10-CM | POA: Diagnosis not present

## 2022-06-08 NOTE — Progress Notes (Signed)
BOWDEN, BOODY ([id number]) [id number] Page 1 of 13 Visit Report for 06/08/2022 Chief Complaint Document Details Patient Name: Date of Service: Anthony Hark [name] 06/08/2022 10:00 A M Medical Record Number: [id number] Patient Account Number: [id number] Date of Birth/Sex: Treating RN: 11/18/1955 (66 y.o. Anthony Barnes Primary Care Provider: Hoy Register Other Clinician: Referring Provider: Treating Provider/Extender: Camillo Flaming Weeks in Treatment: 22 Information Obtained from: Patient Chief Complaint Right LE Ulcer Electronic Signature(s) Signed: 06/08/2022 10:34:08 AM By: Duanne Guess MD FACS Entered By: Duanne Guess on 06/08/2022 10:34:08 -------------------------------------------------------------------------------- Debridement Details Patient Name: Date of Service: Anthony Barnes, Anthony [name] 06/08/2022 10:00 A M Medical Record Number: [id number] Patient Account Number: [id number] Date of Birth/Sex: Treating RN: [birth year] (66 y.o. Anthony Barnes Primary Care Provider: Hoy Register Other Clinician: Referring Provider: Treating Provider/Extender: Camillo Flaming Weeks in Treatment: 22 Debridement Performed for Assessment: Wound #2 Right,Lateral Lower Leg Performed By: Physician Duanne Guess, MD Debridement Type: Debridement Severity of Tissue Pre Debridement: Fat layer exposed Level of Consciousness (Pre-procedure): Awake and Alert Pre-procedure Verification/Time Out Yes - 10:24 Taken: Start Time: 10:25 Pain Control: Lidocaine 5% topical ointment T Area Debrided (L x W): otal 3.3 (cm) x 0.7 (cm) = 2.31 (cm) Tissue and other material debrided: Non-Viable, Eschar, Slough, Slough Level: Non-Viable Tissue Debridement Description: Selective/Open Wound Instrument: Curette Bleeding: Minimum Hemostasis Achieved: Pressure Procedural Pain: 4 Post Procedural Pain: 0 Response to  Treatment: Procedure was tolerated well Level of Consciousness (Post- Awake and Alert procedure): Post Debridement Measurements of Total Wound Length: (cm) 3.3 Width: (cm) 0.7 Depth: (cm) 0.1 Volume: (cm) 0.181 Character of Wound/Ulcer Post Debridement: Improved Severity of Tissue Post Debridement: Fat layer exposed Anthony Barnes, Anthony Barnes ([id number]) [id number] Page 2 of 13 Post Procedure Diagnosis Same as Pre-procedure Notes Scribed for Dr. Lady Gary by Tommie Ard, RN Electronic Signature(s) Signed: 06/08/2022 10:39:50 AM By: Duanne Guess MD FACS Signed: 06/08/2022 11:57:43 AM By: Dayton Scrape Signed: 06/08/2022 4:24:03 PM By: Samuella Bruin Entered By: Dayton Scrape on 06/08/2022 10:27:05 -------------------------------------------------------------------------------- HPI Details Patient Name: Date of Service: Anthony Barnes, Anthony [name] 06/08/2022 10:00 A M Medical Record Number: [id number] Patient Account Number: [id number] Date of Birth/Sex: Treating RN: 1956-05-08 (66 y.o. Anthony Barnes Primary Care Provider: Hoy Register Other Clinician: Referring Provider: Treating Provider/Extender: Camillo Flaming Weeks in Treatment: 22 History of Present Illness HPI Description: ADMISSION 03/12/18 This is a 66 year old and it was a type II diabetic reasonably poorly controlled with a recent hemoglobin A1c of 8.2. He was tending to his lawn on 01/15/18 when his weed [name] sent a rock up word hitting him in the right anterior leg. He was seen in his primary M.D. office is same time. An x-ray showed no abnormality. He was given a course of cephalexin. Since then he's been applying peroxide and topical antibiotics to the wound. He does not have a history of chronic wounds however he does have chronic edema in the lower legs. He is complaining of pain in the right leg wound but no real history of claudication. Past medical history includes  type 2 diabetes, hypertension, morbid obesity. ABIs in the right leg were noncompressible 03/19/18 on evaluation today patient appears to be tolerating the wrap in general fairly well. He states he's not having that much pain in regard to the wound itself. With that being said he is having some issues with slough buildup on the surface of the wound the Iodoflex does seem to be helpful in  that regard. He is still having discomfort he wonders if I can send in a prescription for ibuprofen or something of like to help him out. I do believe that the prox end may be of benefit for him. 03/25/18 on evaluation today patient appears to be doing very well in regard to his right lateral lower [name]. He's been tolerating the dressing changes without complication that is the wraps. Nonetheless he does continue to have pain he states is been dreading the possibility of having to have debridement again today. His first debridement experience was not optimal. With that being said he does seem to be showing signs of improvement there does appear to be little bit more granulation he does have a lot of slough that really does need to breeding away. 04/04/18;the patient went for arterial studies that were really quite normal. ABI on the right at 1.19 with triphasic waveforms on the left 1.11 with triphasic waveforms. TBI 0.93 on the right and 0.95 on the left. This suggests T should be able to tolerate even 4 layer compression. He is however complaining of a lot of pain with our wraps I'm wondering whether the pain is simply Iodoflex. I changed him to [employer] I'm still going to try to keep him in 3 layer compression 04/12/18 on evaluation today patient actually appears to be doing rather well in regard to his right lower [name] ulcer. He is making good progress although it is slow still I do believe that the Santyl is much better than the Iodoflex. The good news is the patient  seems to be tolerating the dressing changes without complication now that we've gotten good of the Iodoflex which really did burn him quite significantly. Otherwise there's no evidence of infection. 04/17/18 on evaluation today patient actually appears to be showing signs of improvement in regard to the ulcer. Unfortunately he's had somewhat of a rough day due to the fact that he found out that one of his friends suddenly and unexpectedly passed today. He states he's not sure he's in the frame of mind to allow me to debride the wound at this point. Nonetheless I do feel like he is showing signs of the wound getting better little by little each week. 04/24/18 on evaluation today patient's ulcer actually appears to be showing some signs of improvement albeit slow. He has been tolerating the dressing changes without complication except for the wrap which she states was so tight that he had to remove it it was causing a lot of discomfort. Fortunately there is no evidence of infection at this point. He states he only wore the wrap until about the time he got home last week and that he had to take it off. Nonetheless he does not have any other compression stockings, Juxta-Lite wraps, or anything otherwise to really help at this point as far as compression is concerned. 05/01/18 on evaluation today patient actually appears to be doing fairly well in regard to his lower extremity ulcer. He has been tolerating the dressing changes currently without complication. The wrap does seem to be helping as far as the fluid management is concerned. Wound bed itself actually show signs of good improvement although there is some Slough noted there's not as much as previous and he actually has fairly decent granulation noted as well. Overall I'm pleased with the progress of to this point. The patient would prefer not to have any debridement today he states he's actually not feeling too  well in general and he really does not want  any additional discomfort typically debridement is fairly uncomfortable for him. 05/08/18 on evaluation today patient actually appears to be doing a little better in regard to his lower extremity ulcer. He has been tolerating the dressing changes without complication. With that being said I'm actually quite pleased with the fact the wound is not appear to be infected and in general has made a little progress. With that being said I do believe we need to perform some debridement to clean away the necrotic tissue on the surface of the wound. 05/15/18 on evaluation today patient actually appears to be doing a little better in regard to his wound. Fortunately there is some improvement noted fortunately there's also no significant evidence of infection at this point in time. He does have continued pain that really nothing different than previous he did get his MARYLAND, LUPPINO ([id number]) [id number] Page 3 of 13 Juxta-Lite wrap. 05/29/18 on evaluation today patient actually appears to be doing somewhat better in regard to his wound currently. He's been tolerating the dressing changes without complication. With that being said he has been performing the [facility] Dressing changes every day at home. I'm pretty sure that we told him every other day last week or when I saw him rather two weeks ago. With that being said obviously did not hurt to do it daily just that obviously is gonna run out of supplies sooner and that could get him into trouble as far as his insurance is concerned. Nonetheless he at this point still continues to have pain although honestly I don't feel like it's as bad as what it was in the past just based on his reactions at this point. 06/12/18 on evaluation today patient actually appears to be doing better in regard to his lower [name] ulcer. He's been tolerating the dressing changes without complication. Fortunately there does not appear to be any  evidence of infection at this time. No fevers, chills, nausea, or vomiting noted at this time. 06/19/18 evaluation today patient's ulcer on the lower extremity appears to be doing better at this point. he has been tolerating the dressing changes. The patient seems to be somewhat depressed about the progress of his wound although the overall appearance at this time seems to be doing well. In general I'm very pleased with the appearance today he does have some biofilm on the surface of the wound as well as of hyper granular tissue we may want to utilize a little bit of silver nitrate today. 06/27/2018; patient comes into clinic today for a wound on the right lateral calf likely related to chronic venous insufficiency. He has been using medihoney covered with Hydrofera Blue. Wound surface actually looks quite good 07/03/2018 seen today for follow-up and management of lower extremity ulcer right lateral shin. T olerating dressing changes. He has a small amount of biofilm today. Will attempt to remove a portion of the biofilm as he tolerates. He becomes very anxious with wound treatments. He would benefit from layer compression wraps however he refuses compression wraps or anything that smokes his legs. He expressed an inability to tolerate compression wraps. Juxta lite wraps have been ordered. He has been instructed on the appropriate application of the juxta leg wraps. His blood pressure today on visit was 200/120. He denies any blurred vision, chest pain, dizziness, shortness of breath, or difficulty with mobility. Mr. Nemetz stated that he had a recent visit with his primary care provider. At  that time his carvedilol was decreased from 25 mg daily to 12.5 mg daily. Strongly encouraged Mr. Codispoti to seek medical attention today during visit. He declined transport for evaluation of elevated blood pressure. Encouraged him to contact his primary care provider today for medication management. He stated that  he would call his primary care doctor after wound visit today. Also encouraged him that if he experienced any symptoms of blurred vision, chest pain, shortness of breath to immediately seek medical attention. 07/17/18 on evaluation today patient's wound actually does seem to show signs of improvement based on the overall appearance of the wound bed today. There does not appear to show any signs of infection which is good news. There is no overall worsening which is also good news. He still has hyper granular tissue will use in the [employer] followed by [facility] Dressing this point. 07/31/18 on evaluation today patient's wound bed actually show signs of improvement with good epithelialization especially in the upper portion of the wound. He has been tolerating the dressing changes without complication in general. Overall I'm extremely happy with how things stand. No fevers, chills, nausea, or vomiting noted at this time. 08/28/18 on evaluation today patient appears to be doing a little better in regard to his lower extremity ulcer. With that being said it appears to be very hyper granular I think this is directly attributed to the fact that he continues to use the [employer] underneath the [facility] Dressing. Although this helps not to stick unfortunately also think he's not getting the benefit of the [facility] Dressing particular and subsequently is causing too much moisture buildup hence the hyper granulation. Fortunately there does not appear to be any signs of infection at this time which is good news. 09/03/18 on evaluation today patient appears to be doing well in regard to his lower [name] ulcer. He has been tolerating the dressing changes without complication. Fortunately he has much less hyper granular tissue the noted last week I do think using silver nitrate in changing to using the [facility] Dressing without the mepitel has made a big difference for him this is good  news. 09/18/18 on evaluation today patient appears to be doing excellent in regard to his right lower [name] ulcer. This is actually significantly smaller even compared to the last evaluation. Overall I'm very pleased in this regard. I'm a recommend that we likely repeat the silver nitrate today based on what I'm seeing. 10/02/18 on evaluation today patient appears to be doing excellent in regard to his lower extremity wound on the right. He's been tolerating the dressing changes without complication. Fortunately there's no signs of infection at this time. Overall been very pleased with how things seem to be progressing. No fevers, chills, nausea, or vomiting noted at this time. 10/16/18 on evaluation today patient actually appears to be doing much better in regard to his lower extremity wound on the right. This is shown signs of good improvement and is indeed measuring smaller he is on a excellent track as far as healing is concerned. My hope is this will be healed of the next several weeks. Fortunately there is no evidence of infection at this time. He does seem to be doing everything that I'm recommending for him 10/30/18 on evaluation today patient appears to be doing better in regard to his lower extremity ulcer. He's been tolerating the dressing changes without complication. Fortunately the Hydrofera Blue Dressing to be doing the job. He has made excellent progress. With  that being said this is still going somewhat slow but nonetheless is always making good progress at this point. 5/18-Patient was in to be seen for right leg ulcer that appeared after scab above it was removed today. This area had healed completely. It appears that no further action is required on this READMISSION 01/02/2022 This is a now 66 year old male with poorly controlled type 2 diabetes mellitus and chronic venous insufficiency who once again was performing lawn care when a rock was flung up by his weedeater and it struck him  in the right lateral lower leg. The wound has not healed though it has been nearly 2 months since the injury occurred. He was seen in the emergency department at [facility] on May 9. At that time it was very painful and he described it as a "15 on a scale of 010". He also was found to have 3+ pitting edema to the bilateral lower extremities. He was prescribed a weeks course of Keflex. He is here today because the wound has failed to heal and he has a prior history in our clinic. ABI in clinic today was noncompressible. He did have formal vascular studies performed in 2019 which were normal. The last hemoglobin A1c I have available for review was from March 21, 2021 and was elevated at 7.7%. The wound is fairly small and circular located about 3 inches above his right lateral malleolus. It is completely covered with eschar. No surrounding erythema, no odor, no purulent drainage. 01/11/2022: The wound on his right lateral leg is a little bit smaller today. It has reaccumulated some slough and a small bit of eschar. It remains fairly painful. He has another area on his anterior tibia that he will not let me touch but I am concerned that there is a wound forming underneath the surface. 01/18/2022: Unfortunately, his wound is a little bit bigger today. He continues to accumulate slough on the wound surface. It is still quite tender. 01/25/2022: The patient bumped his wound on the car door which resulted in bleeding. He was seen at urgent care where it was redressed. Unfortunately, the wound is bigger again today. There is accumulated slough on the surface. Urgent care gave him some tramadol, apparently and he took this prior to his visit so he could tolerate debridement better. 02/02/2022: The wound is a little bit smaller today. The surface, however, has accumulated a fairly thick layer of slough. The periwound skin is intact and there is no obvious sign of infection. 02/07/2022: The wound is a bit larger  today. It has thick slough accumulation. The periwound skin is intact and there is no obvious erythema, induration, nor any odor. 02/16/2022: I took a culture last week due to the appearance of the wound and the patient's degree of pain. This grew out methicillin sensitive Staph aureus. AGAMJOT, KILGALLON ([id number]) [id number] Page 4 of 13 Augmentin was prescribed. The patient states that the pain is markedly improved today. The wound also looks much better. It is smaller with less slough buildup and there is good granulation tissue emerging. 02/21/2022: The patient's pain is quite improved from prior visits. The wound is about the same size and has some accumulated slough on the surface. The base is a little bit fibrotic but there are some areas of granulation tissue filling in. 02/28/2022: The wound is a little bit narrower on its measurements but slightly longer. There is still some slough accumulation on the surface, but he has minimal pain and there is  no concern for ongoing infection. Granulation tissue is emerging. 03/07/2022: During the week, he cut holes in his wrap because it was painful and too tight. As a result, his leg was quite a bit more swollen today and his wound was larger. The wound surface is fairly clean but a little bit dry. Minimal slough accumulation. Granulation tissue present. 03/14/2022: Once again, he did some arts and crafts projects with his wrap. On the other hand, however, his wound does look better. It is smaller today and is beginning to fill in. Minimal slough and a small amount of eschar accumulation. 03/27/2022: His wound measured bigger today, although it does not appear to be so on my impression. The wound continues to fill with granulation tissue. There is a little bit of slough and eschar accumulation. He continues to cut his wrap off of his feet and his edema control at the ankle is particularly poor with 3+ pitting edema. His insurance  will not cover any sort of skin substitute unfortunately. 04/04/2022: The wound is smaller today. It is more superficial with good granulation tissue and just a little bit of slough. He continues to cut away portions of his wrap which results in inadequate edema control. 04/11/2022: His wound measured a couple millimeters larger today. It continues to fill with granulation tissue and there is minimal slough on the surface. 04/19/2022: No significant change in the wound dimensions, but the surface is healthier and less fibrotic. 04/26/2022: The wound is a little bit bigger again today. There is slough accumulation on the surface. Edema control is inadequate. He has been cutting the foot portion of his wrap off each week. 05/03/2022: The wound is a little smaller this week. There is still a fair amount of slough on the surface. His drainage was a little bit as greenish today, although not the blue-green typically associated with Pseudomonas. No significant odor. The underlying granulation tissue appears healthy. The culture that I took last week grew out methicillin sensitive Staph aureus. 05/10/2022: The wound is again a little bit smaller this week. He still has slough accumulation on the surface. Most of the surface has fairly decent granulation tissue, but there is still a fibrotic portion in the center near the caudal aspect of the wound. He did not pick up the Augmentin that I prescribed for his MSSA infection. He says he will get it today. 05/19/2022: His wound is smaller by half a centimeter today. There is a little bit of slough buildup, but less so than at previous visits. He has not been taking his Augmentin properly. He has been splitting the tablets in half and taking them on an irregular schedule. He says this is because it has been upsetting his stomach. 05/25/2022: His wound is a little bit smaller again this week. Very minimal slough accumulation. The wound is drier than I would like to see.  He completed his course of Augmentin. 10/19; patient has a wound on the right lateral lower leg in the setting of obvious chronic venous insufficiency. He has had a previous wound on the anterior leg that we dealt with several years ago. He has been using Hydrofera Blue. Kerlix and Coban 06/08/2022: The wound is smaller again this week. There is some light slough accumulation. It remains a little dry. Electronic Signature(s) Signed: 06/08/2022 10:34:47 AM By: Duanne Guess MD FACS Entered By: Duanne Guess on 06/08/2022 10:34:47 -------------------------------------------------------------------------------- Physical Exam Details Patient Name: Date of Service: Anthony Barnes, Anthony [name] 06/08/2022 10:00 A M Medical  Record Number: [id number] Patient Account Number: [id number] Date of Birth/Sex: Treating RN: 11/25/1955 (66 y.o. Anthony Barnes Primary Care Provider: Hoy Register Other Clinician: Referring Provider: Treating Provider/Extender: Camillo Flaming Weeks in Treatment: 22 Constitutional Hypertensive, asymptomatic. . . . No acute distress.Marland Kitchen Respiratory Normal work of breathing on room air.. Notes 06/08/2022: The wound is smaller again this week. There is some light slough accumulation. It remains a little dry. Electronic Signature(s) Signed: 06/08/2022 10:35:21 AM By: Duanne Guess MD FACS Entered By: Duanne Guess on 06/08/2022 10:35:21 Anthony Barnes ([id number]) [id number] Page 5 of 13 -------------------------------------------------------------------------------- Physician Orders Details Patient Name: Date of Service: Anthony Hark [name] 06/08/2022 10:00 A M Medical Record Number: [id number] Patient Account Number: [id number] Date of Birth/Sex: Treating RN: 24-Dec-1955 (66 y.o. Anthony Barnes Primary Care Provider: Hoy Register Other Clinician: Referring Provider: Treating Provider/Extender:  Camillo Flaming Weeks in Treatment: 103 Verbal / Phone Orders: No Diagnosis Coding ICD-10 Coding Code Description [id number] Non-pressure chronic ulcer of other part of right lower leg with fat layer exposed E11.622 Type 2 diabetes mellitus with other skin ulcer E11.9 Type 2 diabetes mellitus without complications I10 Essential (primary) hypertension I87.2 Venous insufficiency (chronic) (peripheral) Follow-up Appointments ppointment in 1 week. - Dr. Lady Gary - room 2 Return A Anesthetic Wound #2 Right,Lateral Lower Leg (In clinic) Topical Lidocaine 5% applied to wound bed Bathing/ Shower/ Hygiene May shower with protection but do not get wound dressing(s) wet. - may purchase a cast protector from [facility] or [facility] Edema Control - Lymphedema / SCD / Other Elevate legs to the level of the heart or above for 30 minutes daily and/or when sitting, a frequency of: Avoid standing for long periods of time. Patient to wear own compression stockings every day. Exercise regularly Moisturize legs daily. Compression stocking or Garment 20-30 mm/Hg pressure to: - to L left until R leg is healed Wound Treatment Wound #2 - Lower Leg Wound Laterality: Right, Lateral Cleanser: Soap and Water 1 x Per Week/30 Days Discharge Instructions: May shower and wash wound with dial antibacterial soap and water prior to dressing change. Cleanser: Wound Cleanser 1 x Per Week/30 Days Discharge Instructions: Cleanse the wound with wound cleanser prior to applying a clean dressing using gauze sponges, not tissue or cotton balls. Peri-Wound Care: Triamcinolone 15 (g) 1 x Per Week/30 Days Discharge Instructions: Use triamcinolone 15 (g) as directed Peri-Wound Care: Sween Lotion (Moisturizing lotion) 1 x Per Week/30 Days Discharge Instructions: Apply moisturizing lotion as directed Topical: Skintegrity Hydrogel 4 (oz) 1 x Per Week/30 Days Discharge Instructions: Apply hydrogel as directed Prim Dressing:  Hydrofera Blue Ready Foam, 4x5 in 1 x Per Week/30 Days ary Discharge Instructions: Apply to wound bed as instructed Secondary Dressing: Woven Gauze Sponge, Non-Sterile 4x4 in 1 x Per Week/30 Days Discharge Instructions: Apply over primary dressing as directed. Secured With: 65M Medipore H Soft Cloth Surgical T ape, 4 x 10 (in/yd) 1 x Per Week/30 Days Discharge Instructions: Secure with tape as directed. Compression Wrap: ThreePress (3 layer compression wrap) 1 x Per Week/30 Days Discharge Instructions: Apply three layer compression as directed. Anthony, Barnes ([id number]) [id number] Page 6 of 13 Electronic Signature(s) Signed: 06/08/2022 10:39:50 AM By: Duanne Guess MD FACS Entered By: Duanne Guess on 06/08/2022 10:35:31 -------------------------------------------------------------------------------- Problem List Details Patient Name: Date of Service: Anthony Barnes, Anthony [name] 06/08/2022 10:00 A M Medical Record Number: [id number] Patient Account Number: [id number] Date of Birth/Sex: Treating RN: 25-Oct-1955 (66 y.o. Anthony Barnes  Primary Care Provider: Hoy Register Other Clinician: Referring Provider: Treating Provider/Extender: Whitney Muse, Odette Horns Weeks in Treatment: 22 Active Problems ICD-10 Encounter Code Description Active Date MDM Diagnosis L97.812 Non-pressure chronic ulcer of other part of right lower leg with fat layer 01/02/2022 No Yes exposed E11.622 Type 2 diabetes mellitus with other skin ulcer 01/02/2022 No Yes E11.9 Type 2 diabetes mellitus without complications 01/02/2022 No Yes I10 Essential (primary) hypertension 01/02/2022 No Yes I87.2 Venous insufficiency (chronic) (peripheral) 01/02/2022 No Yes Inactive Problems Resolved Problems Electronic Signature(s) Signed: 06/08/2022 10:33:53 AM By: Duanne Guess MD FACS Entered By: Duanne Guess on 06/08/2022  10:33:53 -------------------------------------------------------------------------------- Progress Note Details Patient Name: Date of Service: Anthony Barnes, Anthony [name] 06/08/2022 10:00 A M Medical Record Number: [id number] Patient Account Number: [id number] Date of Birth/Sex: Treating RN: Jan 28, 1956 (66 y.o. Anthony Barnes Primary Care Provider: Hoy Register Other Clinician: Referring Provider: Treating Provider/Extender: Camillo Flaming Weeks in Treatment: [address] ([id number]) [id number] Page 7 of 13 Subjective Chief Complaint Information obtained from Patient Right LE Ulcer History of Present Illness (HPI) ADMISSION 03/12/18 This is a 66 year old and it was a type II diabetic reasonably poorly controlled with a recent hemoglobin A1c of 8.2. He was tending to his lawn on 01/15/18 when his weed [name] sent a rock up word hitting him in the right anterior leg. He was seen in his primary M.D. office is same time. An x-ray showed no abnormality. He was given a course of cephalexin. Since then he's been applying peroxide and topical antibiotics to the wound. He does not have a history of chronic wounds however he does have chronic edema in the lower legs. He is complaining of pain in the right leg wound but no real history of claudication. Past medical history includes type 2 diabetes, hypertension, morbid obesity. ABIs in the right leg were noncompressible 03/19/18 on evaluation today patient appears to be tolerating the wrap in general fairly well. He states he's not having that much pain in regard to the wound itself. With that being said he is having some issues with slough buildup on the surface of the wound the Iodoflex does seem to be helpful in that regard. He is still having discomfort he wonders if I can send in a prescription for ibuprofen or something of like to help him out. I do believe that the prox end may be  of benefit for him. 03/25/18 on evaluation today patient appears to be doing very well in regard to his right lateral lower [name]. He's been tolerating the dressing changes without complication that is the wraps. Nonetheless he does continue to have pain he states is been dreading the possibility of having to have debridement again today. His first debridement experience was not optimal. With that being said he does seem to be showing signs of improvement there does appear to be little bit more granulation he does have a lot of slough that really does need to breeding away. 04/04/18;the patient went for arterial studies that were really quite normal. ABI on the right at 1.19 with triphasic waveforms on the left 1.11 with triphasic waveforms. TBI 0.93 on the right and 0.95 on the left. This suggests T should be able to tolerate even 4 layer compression. He is however complaining of a lot of pain with our wraps I'm wondering whether the pain is simply Iodoflex. I changed him to [employer] I'm still going to try to keep him in  3 layer compression 04/12/18 on evaluation today patient actually appears to be doing rather well in regard to his right lower [name] ulcer. He is making good progress although it is slow still I do believe that the Santyl is much better than the Iodoflex. The good news is the patient seems to be tolerating the dressing changes without complication now that we've gotten good of the Iodoflex which really did burn him quite significantly. Otherwise there's no evidence of infection. 04/17/18 on evaluation today patient actually appears to be showing signs of improvement in regard to the ulcer. Unfortunately he's had somewhat of a rough day due to the fact that he found out that one of his friends suddenly and unexpectedly passed today. He states he's not sure he's in the frame of mind to allow me to debride the wound at this point. Nonetheless I  do feel like he is showing signs of the wound getting better little by little each week. 04/24/18 on evaluation today patient's ulcer actually appears to be showing some signs of improvement albeit slow. He has been tolerating the dressing changes without complication except for the wrap which she states was so tight that he had to remove it it was causing a lot of discomfort. Fortunately there is no evidence of infection at this point. He states he only wore the wrap until about the time he got home last week and that he had to take it off. Nonetheless he does not have any other compression stockings, Juxta-Lite wraps, or anything otherwise to really help at this point as far as compression is concerned. 05/01/18 on evaluation today patient actually appears to be doing fairly well in regard to his lower extremity ulcer. He has been tolerating the dressing changes currently without complication. The wrap does seem to be helping as far as the fluid management is concerned. Wound bed itself actually show signs of good improvement although there is some Slough noted there's not as much as previous and he actually has fairly decent granulation noted as well. Overall I'm pleased with the progress of to this point. The patient would prefer not to have any debridement today he states he's actually not feeling too well in general and he really does not want any additional discomfort typically debridement is fairly uncomfortable for him. 05/08/18 on evaluation today patient actually appears to be doing a little better in regard to his lower extremity ulcer. He has been tolerating the dressing changes without complication. With that being said I'm actually quite pleased with the fact the wound is not appear to be infected and in general has made a little progress. With that being said I do believe we need to perform some debridement to clean away the necrotic tissue on the surface of the wound. 05/15/18 on evaluation  today patient actually appears to be doing a little better in regard to his wound. Fortunately there is some improvement noted fortunately there's also no significant evidence of infection at this point in time. He does have continued pain that really nothing different than previous he did get his Juxta-Lite wrap. 05/29/18 on evaluation today patient actually appears to be doing somewhat better in regard to his wound currently. He's been tolerating the dressing changes without complication. With that being said he has been performing the [facility] Dressing changes every day at home. I'm pretty sure that we told him every other day last week or when I saw him rather two weeks ago. With that being said  obviously did not hurt to do it daily just that obviously is gonna run out of supplies sooner and that could get him into trouble as far as his insurance is concerned. Nonetheless he at this point still continues to have pain although honestly I don't feel like it's as bad as what it was in the past just based on his reactions at this point. 06/12/18 on evaluation today patient actually appears to be doing better in regard to his lower [name] ulcer. He's been tolerating the dressing changes without complication. Fortunately there does not appear to be any evidence of infection at this time. No fevers, chills, nausea, or vomiting noted at this time. 06/19/18 evaluation today patient's ulcer on the lower extremity appears to be doing better at this point. he has been tolerating the dressing changes. The patient seems to be somewhat depressed about the progress of his wound although the overall appearance at this time seems to be doing well. In general I'm very pleased with the appearance today he does have some biofilm on the surface of the wound as well as of hyper granular tissue we may want to utilize a little bit of silver nitrate today. 06/27/2018; patient comes into clinic today for a wound on the  right lateral calf likely related to chronic venous insufficiency. He has been using medihoney covered with Hydrofera Blue. Wound surface actually looks quite good 07/03/2018 seen today for follow-up and management of lower extremity ulcer right lateral shin. T olerating dressing changes. He has a small amount of biofilm today. Will attempt to remove a portion of the biofilm as he tolerates. He becomes very anxious with wound treatments. He would benefit from layer compression wraps however he refuses compression wraps or anything that smokes his legs. He expressed an inability to tolerate compression wraps. Juxta lite wraps have been ordered. He has been instructed on the appropriate application of the juxta leg wraps. His blood pressure today on visit was 200/120. He denies any blurred vision, chest pain, dizziness, shortness of breath, or difficulty with mobility. Mr. Ilic stated that he had a recent visit with his primary care provider. At that time his carvedilol was decreased from 25 mg daily to 12.5 mg daily. Strongly encouraged Mr. Pavon to seek medical attention today during visit. He declined transport for evaluation of elevated blood pressure. Encouraged him to contact his primary care provider today for medication management. He stated that he would call his primary care doctor after wound visit today. Also encouraged him that if he experienced any symptoms of blurred vision, chest pain, shortness of breath to immediately seek medical attention. 07/17/18 on evaluation today patient's wound actually does seem to show signs of improvement based on the overall appearance of the wound bed today. There does not appear to show any signs of infection which is good news. There is no overall worsening which is also good news. He still has hyper granular tissue will use in the [employer] followed by [facility] Dressing this point. Anthony Barnes, Anthony Barnes ([id number])  [id number] Page 8 of 13 07/31/18 on evaluation today patient's wound bed actually show signs of improvement with good epithelialization especially in the upper portion of the wound. He has been tolerating the dressing changes without complication in general. Overall I'm extremely happy with how things stand. No fevers, chills, nausea, or vomiting noted at this time. 08/28/18 on evaluation today patient appears to be doing a little better in regard to his lower extremity ulcer. With that being  said it appears to be very hyper granular I think this is directly attributed to the fact that he continues to use the [employer] underneath the [facility] Dressing. Although this helps not to stick unfortunately also think he's not getting the benefit of the [facility] Dressing particular and subsequently is causing too much moisture buildup hence the hyper granulation. Fortunately there does not appear to be any signs of infection at this time which is good news. 09/03/18 on evaluation today patient appears to be doing well in regard to his lower [name] ulcer. He has been tolerating the dressing changes without complication. Fortunately he has much less hyper granular tissue the noted last week I do think using silver nitrate in changing to using the [facility] Dressing without the mepitel has made a big difference for him this is good news. 09/18/18 on evaluation today patient appears to be doing excellent in regard to his right lower [name] ulcer. This is actually significantly smaller even compared to the last evaluation. Overall I'm very pleased in this regard. I'm a recommend that we likely repeat the silver nitrate today based on what I'm seeing. 10/02/18 on evaluation today patient appears to be doing excellent in regard to his lower extremity wound on the right. He's been tolerating the dressing changes without complication. Fortunately there's no signs of infection at  this time. Overall been very pleased with how things seem to be progressing. No fevers, chills, nausea, or vomiting noted at this time. 10/16/18 on evaluation today patient actually appears to be doing much better in regard to his lower extremity wound on the right. This is shown signs of good improvement and is indeed measuring smaller he is on a excellent track as far as healing is concerned. My hope is this will be healed of the next several weeks. Fortunately there is no evidence of infection at this time. He does seem to be doing everything that I'm recommending for him 10/30/18 on evaluation today patient appears to be doing better in regard to his lower extremity ulcer. He's been tolerating the dressing changes without complication. Fortunately the Hydrofera Blue Dressing to be doing the job. He has made excellent progress. With that being said this is still going somewhat slow but nonetheless is always making good progress at this point. 5/18-Patient was in to be seen for right leg ulcer that appeared after scab above it was removed today. This area had healed completely. 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 The wound has not healed though it has been nearly 2 months since the injury occurred. He was seen in the emergency department at [facility] on May 9. At that time it was very painful and he described it as a "15 on a scale of 0oo10". He also was found to have 3+ pitting edema to the bilateral lower extremities. He was prescribed a weeks course of Keflex. He is here today because the wound has failed to heal and he has a prior history in our clinic. ABI in clinic today was noncompressible. He did have formal  vascular studies performed in 2019 which were normal. The last hemoglobin A1c I have available for review was from March 21, 2021 and was  elevated at 7.7%. The wound is fairly small and circular located about 3 inches above his right lateral malleolus. It is completely covered with eschar. No surrounding erythema, no odor, no purulent drainage. 01/11/2022: The wound on his right lateral leg is a little bit smaller today. It has reaccumulated some slough and a small bit of eschar. It remains fairly painful. He has another area on his anterior tibia that he will not let me touch but I am concerned that there is a wound forming underneath the surface. 01/18/2022: Unfortunately, his wound is a little bit bigger today. He continues to accumulate slough on the wound surface. It is still quite tender. 01/25/2022: The patient bumped his wound on the car door which resulted in bleeding. He was seen at urgent care where it was redressed. Unfortunately, the wound is bigger again today. There is accumulated slough on the surface. Urgent care gave him some tramadol, apparently and he took this prior to his visit so he could tolerate debridement better. 02/02/2022: The wound is a little bit smaller today. The surface, however, has accumulated a fairly thick layer of slough. The periwound skin is intact and there is no obvious sign of infection. 02/07/2022: The wound is a bit larger today. It has thick slough accumulation. The periwound skin is intact and there is no obvious erythema, induration, nor any odor. 02/16/2022: I took a culture last week due to the appearance of the wound and the patient's degree of pain. This grew out methicillin sensitive Staph aureus. Augmentin was prescribed. The patient states that the pain is markedly improved today. The wound also looks much better. It is smaller with less slough buildup and there is good granulation tissue emerging. 02/21/2022: The patient's pain is quite improved from  prior visits. The wound is about the same size and has some accumulated slough on the surface. The base is a little bit fibrotic but there are some areas of granulation tissue filling in. 02/28/2022: The wound is a little bit narrower on its measurements but slightly longer. There is still some slough accumulation on the surface, but he has minimal pain and there is no concern for ongoing infection. Granulation tissue is emerging. 03/07/2022: During the week, he cut holes in his wrap because it was painful and too tight. As a result, his leg was quite a bit more swollen today and his wound was larger. The wound surface is fairly clean but a little bit dry. Minimal slough accumulation. Granulation tissue present. 03/14/2022: Once again, he did some arts and crafts projects with his wrap. On the other hand, however, his wound does look better. It is smaller today and is beginning to fill in. Minimal slough and a small amount of eschar accumulation. 03/27/2022: His wound measured bigger today, although it does not appear to be so on my impression. The wound continues to fill with granulation tissue. There is a little bit of slough and eschar accumulation. He continues to cut his wrap off of his feet and his edema control at the ankle is particularly poor with 3+ pitting edema. His insurance will not cover any sort of skin substitute unfortunately. 04/04/2022: The wound is smaller today. It is more superficial with good granulation tissue and just a little bit of slough. He continues to cut away portions of his wrap which results in inadequate edema control. 04/11/2022: His wound measured a couple millimeters larger today. It continues to fill with granulation tissue and there is minimal slough on the surface.  04/19/2022: No significant change in the wound dimensions, but the surface is healthier and less fibrotic. 04/26/2022: The wound is a little bit bigger again today. There is slough accumulation on the  surface. Edema control is inadequate. He has been cutting the foot portion of his wrap off each week. 05/03/2022: The wound is a little smaller this week. There is still a fair amount of slough on the surface. His drainage was a little bit as greenish today, although not the blue-green typically associated with Pseudomonas. No significant odor. The underlying granulation tissue appears healthy. The culture that I took last Anthony MassyGLADNEY, Anthony Barnes ([id number]) [id number] Page 9 of 13 week grew out methicillin sensitive Staph aureus. 05/10/2022: The wound is again a little bit smaller this week. He still has slough accumulation on the surface. Most of the surface has fairly decent granulation tissue, but there is still a fibrotic portion in the center near the caudal aspect of the wound. He did not pick up the Augmentin that I prescribed for his MSSA infection. He says he will get it today. 05/19/2022: His wound is smaller by half a centimeter today. There is a little bit of slough buildup, but less so than at previous visits. He has not been taking his Augmentin properly. He has been splitting the tablets in half and taking them on an irregular schedule. He says this is because it has been upsetting his stomach. 05/25/2022: His wound is a little bit smaller again this week. Very minimal slough accumulation. The wound is drier than I would like to see. He completed his course of Augmentin. 10/19; patient has a wound on the right lateral lower leg in the setting of obvious chronic venous insufficiency. He has had a previous wound on the anterior leg that we dealt with several years ago. He has been using Hydrofera Blue. Kerlix and Coban 06/08/2022: The wound is smaller again this week. There is some light slough accumulation. It remains a little dry. Patient History Information obtained from Patient. Family History Cancer - Mother, Diabetes - Mother,Father, Hypertension -  Mother,Father, Kidney Disease - Siblings, Stroke - Father, No family history of Heart Disease, Hereditary Spherocytosis, Lung Disease, Seizures, Thyroid Problems, Tuberculosis. Social History Former smoker - ended on 08/14/1990, Marital Status - Married, Alcohol Use - Moderate, Drug Use - No History, Caffeine Use - Daily. Medical History Eyes Denies history of Cataracts, Glaucoma, Optic Neuritis Ear/Nose/Mouth/Throat Denies history of Chronic sinus problems/congestion, Middle ear problems Hematologic/Lymphatic Denies history of Anemia, Hemophilia, Human Immunodeficiency Virus, Lymphedema, Sickle Cell Disease Respiratory Denies history of Aspiration, Asthma, Chronic Obstructive Pulmonary Disease (COPD), Pneumothorax, Sleep Apnea, Tuberculosis Cardiovascular Patient has history of Hypertension Denies history of Angina, Arrhythmia, Congestive Heart Failure, Coronary Artery Disease, Deep Vein Thrombosis, Hypotension, Myocardial Infarction, Peripheral Arterial Disease, Peripheral Venous Disease, Phlebitis, Vasculitis Gastrointestinal Denies history of Cirrhosis , Colitis, Crohnoos, Hepatitis A, Hepatitis B, Hepatitis C Endocrine Patient has history of Type II Diabetes Genitourinary Denies history of End Stage Renal Disease Immunological Denies history of Lupus Erythematosus, Raynaudoos, Scleroderma Integumentary (Skin) Denies history of History of Burn Musculoskeletal Denies history of Gout, Rheumatoid Arthritis, Osteoarthritis, Osteomyelitis Neurologic Denies history of Dementia, Neuropathy, Quadriplegia, Paraplegia, Seizure Disorder Oncologic Denies history of Received Chemotherapy, Received Radiation Psychiatric Denies history of Anorexia/bulimia, Confinement Anxiety Hospitalization/Surgery History - esophagogastroduodenoscopy. - brain aneurysm surgery. Medical A Surgical History Notes nd Constitutional Symptoms (General  Health) obesity Gastrointestinal diverticulitis Neurologic neuropathy Objective Constitutional Hypertensive, asymptomatic. No acute distress.. Vitals Time Taken: 10:00 AM, Height: 75  in, Weight: 310 lbs, BMI: 38.7, Temperature: 98.3 F, Pulse: 68 bpm, Respiratory Rate: 20 breaths/min, Blood Pressure: 157/91 mmHg. Respiratory Anthony Barnes, Anthony Barnes ([id number]) [id number] Page 10 of 13 Normal work of breathing on room air.. General Notes: 06/08/2022: The wound is smaller again this week. There is some light slough accumulation. It remains a little dry. Integumentary (Hair, Skin) Wound #2 status is Open. Original cause of wound was Trauma. The date acquired was: 12/03/2021. The wound has been in treatment 22 weeks. The wound is located on the Right,Lateral Lower Leg. The wound measures 3.3cm length x 0.7cm width x 0.1cm depth; 1.814cm^2 area and 0.181cm^3 volume. There is Fat Layer (Subcutaneous Tissue) exposed. There is no tunneling or undermining noted. There is a medium amount of serosanguineous drainage noted. The wound margin is distinct with the outline attached to the wound base. There is large (67-100%) red granulation within the wound bed. There is a small (1-33%) amount of necrotic tissue within the wound bed including Adherent Slough. The periwound skin appearance had no abnormalities noted for texture. The periwound skin appearance had no abnormalities noted for color. The periwound skin appearance exhibited: Dry/Scaly. Periwound temperature was noted as No Abnormality. Assessment Active Problems ICD-10 Non-pressure chronic ulcer of other part of right lower leg with fat layer exposed Type 2 diabetes mellitus with other skin ulcer Type 2 diabetes mellitus without complications Essential (primary) hypertension Venous insufficiency (chronic) (peripheral) Procedures Wound #2 Pre-procedure diagnosis of Wound #2 is a Venous Leg Ulcer located on the  Right,Lateral Lower Leg .Severity of Tissue Pre Debridement is: Fat layer exposed. There was a Selective/Open Wound Non-Viable Tissue Debridement with a total area of 2.31 sq cm performed by Fredirick Maudlin, MD. With the following instrument(s): Curette to remove Non-Viable tissue/material. Material removed includes Eschar and Slough and after achieving pain control using Lidocaine 5% topical ointment. No specimens were taken. A time out was conducted at 10:24, prior to the start of the procedure. A Minimum amount of bleeding was controlled with Pressure. The procedure was tolerated well with a pain level of 4 throughout and a pain level of 0 following the procedure. Post Debridement Measurements: 3.3cm length x 0.7cm width x 0.1cm depth; 0.181cm^3 volume. Character of Wound/Ulcer Post Debridement is improved. Severity of Tissue Post Debridement is: Fat layer exposed. Post procedure Diagnosis Wound #2: Same as Pre-Procedure General Notes: Scribed for Dr. Celine Ahr by Blanche East, RN. Pre-procedure diagnosis of Wound #2 is a Venous Leg Ulcer located on the Right,Lateral Lower Leg . There was a Three Layer Compression Therapy Procedure by Adline Peals, RN. Post procedure Diagnosis Wound #2: Same as Pre-Procedure Plan Follow-up Appointments: Return Appointment in 1 week. - Dr. Celine Ahr - room 2 Anesthetic: Wound #2 Right,Lateral Lower Leg: (In clinic) Topical Lidocaine 5% applied to wound bed Bathing/ Shower/ Hygiene: May shower with protection but do not get wound dressing(s) wet. - may purchase a cast protector from [place] or [facility] Edema Control - Lymphedema / SCD / Other: Elevate legs to the level of the heart or above for 30 minutes daily and/or when sitting, a frequency of: Avoid standing for long periods of time. Patient to wear own compression stockings every day. Exercise regularly Moisturize legs daily. Compression stocking or Garment 20-30 mm/Hg pressure to: - to L left until R  leg is healed WOUND #2: - Lower Leg Wound Laterality: Right, Lateral Cleanser: Soap and Water 1 x Per Week/30 Days Discharge Instructions: May shower and wash wound with dial antibacterial soap and  water prior to dressing change. Cleanser: Wound Cleanser 1 x Per Week/30 Days Discharge Instructions: Cleanse the wound with wound cleanser prior to applying a clean dressing using gauze sponges, not tissue or cotton balls. Peri-Wound Care: Triamcinolone 15 (g) 1 x Per Week/30 Days Discharge Instructions: Use triamcinolone 15 (g) as directed Peri-Wound Care: Sween Lotion (Moisturizing lotion) 1 x Per Week/30 Days Discharge Instructions: Apply moisturizing lotion as directed Topical: Skintegrity Hydrogel 4 (oz) 1 x Per Week/30 Days Discharge Instructions: Apply hydrogel as directed Prim Dressing: Hydrofera Blue Ready Foam, 4x5 in 1 x Per Week/30 Days ary Discharge Instructions: Apply to wound bed as instructed Secondary Dressing: Woven Gauze Sponge, Non-Sterile 4x4 in 1 x Per Week/30 Days Discharge Instructions: Apply over primary dressing as directed. Secured With: 47M Medipore H Soft Cloth Surgical T ape, 4 x 10 (in/yd) 1 x Per Week/30 Days Discharge Instructions: Secure with tape as directed. Com pression Wrap: ThreePress (3 layer compression wrap) 1 x Per Week/30 Days Anthony Barnes, Anthony Barnes ([id number]) [id number] Page 11 of 13 Discharge Instructions: Apply three layer compression as directed. 06/08/2022: The wound is smaller again this week. There is some light slough accumulation. It remains a little dry. I used a curette to debride slough and eschar from the wound. We will continue to use Hydrofera Blue with hydrogel and 3 layer compression. Follow-up in 1 week. Electronic Signature(s) Signed: 06/08/2022 10:35:57 AM By: Duanne Guess MD FACS Entered By: Duanne Guess on 06/08/2022  10:35:57 -------------------------------------------------------------------------------- HxROS Details Patient Name: Date of Service: Anthony Barnes, Anthony [name] 06/08/2022 10:00 A M Medical Record Number: [id number] Patient Account Number: [id number] Date of Birth/Sex: Treating RN: 05/24/1956 (66 y.o. Anthony Barnes Primary Care Provider: Hoy Register Other Clinician: Referring Provider: Treating Provider/Extender: Camillo Flaming Weeks in Treatment: 22 Information Obtained From Patient Constitutional Symptoms (General Health) Medical History: Past Medical History Notes: obesity Eyes Medical History: Negative for: Cataracts; Glaucoma; Optic Neuritis Ear/Nose/Mouth/Throat Medical History: Negative for: Chronic sinus problems/congestion; Middle ear problems Hematologic/Lymphatic Medical History: Negative for: Anemia; Hemophilia; Human Immunodeficiency Virus; Lymphedema; Sickle Cell Disease Respiratory Medical History: Negative for: Aspiration; Asthma; Chronic Obstructive Pulmonary Disease (COPD); Pneumothorax; Sleep Apnea; Tuberculosis Cardiovascular Medical History: Positive for: Hypertension Negative for: Angina; Arrhythmia; Congestive Heart Failure; Coronary Artery Disease; Deep Vein Thrombosis; Hypotension; Myocardial Infarction; Peripheral Arterial Disease; Peripheral Venous Disease; Phlebitis; Vasculitis Gastrointestinal Medical History: Negative for: Cirrhosis ; Colitis; Crohns; Hepatitis A; Hepatitis B; Hepatitis C Past Medical History Notes: diverticulitis Endocrine Medical HistoryMarland Kitchen GARRON, ELINE ([id number]) [id number] Page 12 of 13 Positive for: Type II Diabetes Time with diabetes: 8 years Treated with: Oral agents Blood sugar tested every day: No Genitourinary Medical History: Negative for: End Stage Renal Disease Immunological Medical History: Negative for: Lupus Erythematosus; Raynauds;  Scleroderma Integumentary (Skin) Medical History: Negative for: History of Burn Musculoskeletal Medical History: Negative for: Gout; Rheumatoid Arthritis; Osteoarthritis; Osteomyelitis Neurologic Medical History: Negative for: Dementia; Neuropathy; Quadriplegia; Paraplegia; Seizure Disorder Past Medical History Notes: neuropathy Oncologic Medical History: Negative for: Received Chemotherapy; Received Radiation Psychiatric Medical History: Negative for: Anorexia/bulimia; Confinement Anxiety Immunizations Pneumococcal Vaccine: Received Pneumococcal Vaccination: No Immunization Notes: tetanus 2 years ago Implantable Devices None Hospitalization / Surgery History Type of Hospitalization/Surgery esophagogastroduodenoscopy brain aneurysm surgery Family and Social History Cancer: Yes - Mother; Diabetes: Yes - Mother,Father; Heart Disease: No; Hereditary Spherocytosis: No; Hypertension: Yes - Mother,Father; Kidney Disease: Yes - Siblings; Lung Disease: No; Seizures: No; Stroke: Yes - Father; Thyroid Problems: No; Tuberculosis: No; Former smoker - ended on 08/14/1990;  Marital Status - Married; Alcohol Use: Moderate; Drug Use: No History; Caffeine Use: Daily; Financial Concerns: No; Food, Clothing or Shelter Needs: No; Support System Lacking: No; Transportation Concerns: No Electronic Signature(s) Signed: 06/08/2022 10:39:50 AM By: Duanne Guess MD FACS Signed: 06/08/2022 4:24:03 PM By: Gelene Mink By: Duanne Guess on 06/08/2022 10:34:54 Anthony Barnes ([id number]) [id number] Page 13 of 13 -------------------------------------------------------------------------------- SuperBill Details Patient Name: Date of Service: Anthony Barnes 06/08/2022 Medical Record Number: [id number] Patient Account Number: [id number] Date of Birth/Sex: Treating RN: [birth year] (66 y.o. Anthony Barnes Primary Care Provider: Hoy Register  Other Clinician: Referring Provider: Treating Provider/Extender: Camillo Flaming Weeks in Treatment: 22 Diagnosis Coding ICD-10 Codes Code Description [phone] Non-pressure chronic ulcer of other part of right lower leg with fat layer exposed E11.622 Type 2 diabetes mellitus with other skin ulcer E11.9 Type 2 diabetes mellitus without complications I10 Essential (primary) hypertension I87.2 Venous insufficiency (chronic) (peripheral) Facility Procedures : CPT4 Code: [id number] Description: 84132 - DEBRIDE WOUND 1ST 20 SQ CM OR < ICD-10 Diagnosis Description 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os Modifier: ed Quantity: 1 Physician Procedures : CPT4 Code Description Modifier [id number] - WC PHYS LEVEL 3 - EST PT 25 ICD-10 Diagnosis Description 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osed E11.622 Type 2 diabetes mellitus with other skin ulcer  I87.2 Venous insufficiency (chronic) (peripheral) I10 Essential (primary) hypertension Quantity: 1 : [id number] - WC PHYS DEBR WO ANESTH 20 SQ CM ICD-10 Diagnosis Description 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osed Quantity: 1 Electronic Signature(s) Signed: 06/08/2022 10:36:34 AM By: Duanne Guess MD FACS Entered By: Duanne Guess on 06/08/2022 10:36:34

## 2022-06-13 NOTE — Progress Notes (Signed)
HARJOT, DIBELLO ([id number]) [id number] Page 1 of 8 Visit Report for 06/08/2022 Arrival Information Details Patient Name: Date of Service: Anthony Barnes [name] 06/08/2022 10:00 A M Medical Record Number: [id number] Patient Account Number: [id number] Date of Birth/Sex: Treating RN: 11/28/1955 (66 y.o. Anthony Barnes Primary Care Anthony Barnes: Anthony Barnes Other Clinician: Referring Anthony Barnes: Treating Anthony Barnes/Extender: Anthony Barnes: Barnes Visit Information History Since Last Visit All ordered tests and consults were completed: No Patient Arrived: Ambulatory Added or deleted any medications: No Arrival Time: 10:05 Any new allergies or adverse reactions: No Transfer Assistance: None Had a fall or experienced change in No Patient Identification Verified: Yes activities of daily living that may affect Secondary Verification Process Completed: Yes risk of falls: Patient Requires Transmission-Based Precautions: No Signs or symptoms of abuse/neglect since last visito No Patient Has Alerts: No Hospitalized since last visit: No Implantable device outside of the clinic excluding No cellular tissue based products placed in the center since last visit: Pain Present Now: No Electronic Signature(s) Signed: 06/08/2022 11:57:43 AM By: Anthony Barnes Entered By: Anthony Barnes on 06/08/2022 10:06:16 -------------------------------------------------------------------------------- Compression Therapy Details Patient Name: Date of Service: Anthony Barnes [name] 06/08/2022 10:00 A M Medical Record Number: [id number] Patient Account Number: [id number] Date of Birth/Sex: Treating RN: Mar 27, 1956 (66 y.o. Anthony Barnes Primary Care Anthony Barnes: Anthony Barnes Other Clinician: Referring Anthony Barnes: Treating Anthony Barnes: Anthony Barnes: Barnes Compression Therapy Performed for Wound  Assessment: Wound #2 Right,Lateral Lower Leg Performed By: Clinician Anthony Peals, RN Compression Type: Three Layer Post Procedure Diagnosis Same as Pre-procedure Electronic Signature(s) Signed: 06/08/2022 11:57:43 AM By: Anthony Barnes Entered By: Anthony Barnes on 06/08/2022 10:27:37 [name] ([id number]) [id number] Page 2 of 8 -------------------------------------------------------------------------------- Encounter Discharge Information Details Patient Name: Date of Service: Anthony Barnes [name] 06/08/2022 10:00 A M Medical Record Number: [id number] Patient Account Number: [id number] Date of Birth/Sex: Treating RN: Jul 08, 1956 (66 y.o. Anthony Barnes Primary Care [place]: Anthony Barnes Other Clinician: Referring Anthony Barnes: Treating Anthony Barnes: Anthony Barnes: Barnes Encounter Discharge Information Items Post Procedure Vitals Discharge Condition: Stable Temperature (F): 98.3 Ambulatory Status: Ambulatory Pulse (bpm): 68 Discharge Destination: Home Respiratory Rate (breaths/min): 20 Transportation: Private Auto Blood Pressure (mmHg): 157/91 Accompanied By: self Schedule Follow-up Appointment: Yes Clinical Summary of Care: Electronic Signature(s) Signed: 06/13/2022 4:16:20 PM By: Anthony East RN Entered By: Anthony Barnes on 06/08/2022 12:20:55 -------------------------------------------------------------------------------- Lower Extremity Assessment Details Patient Name: Date of Service: Anthony Barnes [name] 06/08/2022 10:00 A M Medical Record Number: [id number] Patient Account Number: [id number] Date of Birth/Sex: Treating RN: 30-Sep-1955 (66 y.o. Anthony Barnes Primary Care Anthony Barnes: Anthony Barnes Other Clinician: Referring Anthony Barnes: Treating Anthony Barnes: Anthony Barnes: Barnes Edema Assessment Assessed: [Left: No] [Right: No] [Left: Edema]  [Right: :] Calf Left: Right: Point of Measurement: From Medial Instep 42.2 cm Ankle Left: Right: Point of Measurement: From Medial Instep 26.6 cm Electronic Signature(s) Signed: 06/08/2022 11:57:43 AM By: Anthony Barnes Signed: 06/08/2022 4:24:03 PM By: Anthony Barnes Entered ByWorthy Barnes on 06/08/2022 10:07:10 -------------------------------------------------------------------------------- Multi Wound Chart Details Patient Name: Date of Service: Anthony Barnes, Anthony [name] 06/08/2022 10:00 A M Medical Record Number: [id number] Patient Account Number: [id number] Date of Birth/Sex: Treating RN: [birth year] (66 y.o. Anthony Barnes Primary Care Anthony Barnes: Anthony Barnes Other Clinician: Referring Anthony Barnes: Treating Malyk Girouard/Extender: Anthony Barnes, Anthony Barnes ([id number]) [id number] Page 3 of 8 Weeks in Barnes: Barnes  Vital Signs Height(in): 75 Pulse(bpm): 68 Weight(lbs): 310 Blood Pressure(mmHg): 157/91 Body Mass Index(BMI): 38.7 Temperature(F): 98.3 Respiratory Rate(breaths/min): 20 [2:Photos:] [N/A:N/A] Right, Lateral Lower Leg N/A N/A Wound Location: Trauma N/A N/A Wounding Event: Venous Leg Ulcer N/A N/A Primary Etiology: Hypertension, Type II Diabetes N/A N/A Comorbid History: 4/Barnes/2023 N/A N/A Date Acquired: 93 N/A N/A Weeks of Barnes: Open N/A N/A Wound Status: No N/A N/A Wound Recurrence: 3.3x0.7x0.1 N/A N/A Measurements L x W x D (cm) 1.814 N/A N/A A (cm) : rea 0.181 N/A N/A Volume (cm) : -71.10% N/A N/A % Reduction in A rea: -70.80% N/A N/A % Reduction in Volume: Full Thickness Without Exposed N/A N/A Classification: Support Structures Medium N/A N/A Exudate A mount: Serosanguineous N/A N/A Exudate Type: red, brown N/A N/A Exudate Color: Distinct, outline attached N/A N/A Wound Margin: Large (67-100%) N/A N/A Granulation A mount: Red N/A N/A Granulation Quality: Small (1-33%)  N/A N/A Necrotic A mount: Fat Layer (Subcutaneous Tissue): Yes N/A N/A Exposed Structures: Fascia: No Tendon: No Muscle: No Joint: No Bone: No Small (1-33%) N/A N/A Epithelialization: Debridement - Selective/Open Wound N/A N/A Debridement: Pre-procedure Verification/Time Out 10:24 N/A N/A Taken: Lidocaine 5% topical ointment N/A N/A Pain Control: Necrotic/Eschar, Slough N/A N/A Tissue Debrided: Non-Viable Tissue N/A N/A Level: 2.31 N/A N/A Debridement A (sq cm): rea Curette N/A N/A Instrument: Minimum N/A N/A Bleeding: Pressure N/A N/A Hemostasis A chieved: 4 N/A N/A Procedural Pain: 0 N/A N/A Post Procedural Pain: Procedure was tolerated well N/A N/A Debridement Barnes Response: 3.3x0.7x0.1 N/A N/A Post Debridement Measurements L x W x D (cm) 0.181 N/A N/A Post Debridement Volume: (cm) No Abnormalities Noted N/A N/A Periwound Skin Texture: Dry/Scaly: Yes N/A N/A Periwound Skin Moisture: No Abnormalities Noted N/A N/A Periwound Skin Color: No Abnormality N/A N/A Temperature: Compression Therapy N/A N/A Procedures Performed: Debridement Barnes Notes Electronic Signature(s) Signed: 06/08/2022 10:33:59 AM By: Fredirick Maudlin MD FACS Signed: 06/08/2022 4:24:03 PM By: Anthony Barnes Entered By: Fredirick Maudlin on 06/08/2022 10:33:59 [name] ([id number]) [id number] Page 4 of 8 -------------------------------------------------------------------------------- Multi-Disciplinary Care Plan Details Patient Name: Date of Service: Anthony Barnes [name] 06/08/2022 10:00 A M Medical Record Number: [id number] Patient Account Number: [id number] Date of Birth/Sex: Treating RN: [birth year] (66 y.o. Anthony Barnes Primary Care Kelcey Wickstrom: Anthony Barnes Other Clinician: Referring Wrenn Willcox: Treating Jullianna Gabor/Extender: Anthony Barnes: Barnes Active Inactive Venous Leg Ulcer Nursing  Diagnoses: Actual venous Insuffiency (use after diagnosis is confirmed) Knowledge deficit related to disease process and management Goals: Patient will maintain optimal edema control Date Initiated: 5/Barnes/2023 Target Resolution Date: 07/13/2022 Goal Status: Active Patient/caregiver will verbalize understanding of disease process and disease management Date Initiated: 5/Barnes/2023 Date Inactivated: 03/07/2022 Target Resolution Date: 03/09/2022 Goal Status: Met Interventions: Assess peripheral edema status every visit. Compression as ordered Provide education on venous insufficiency Barnes Activities: Non-invasive vascular studies : 5/Barnes/2023 T ordered outside of clinic : 5/Barnes/2023 est Therapeutic compression applied : 5/Barnes/2023 Notes: Wound/Skin Impairment Nursing Diagnoses: Impaired tissue integrity Knowledge deficit related to ulceration/compromised skin integrity Goals: Patient/caregiver will verbalize understanding of skin care regimen Date Initiated: 5/Barnes/2023 Date Inactivated: 6/Barnes/2023 Target Resolution Date: 02/03/2022 Goal Status: Met Ulcer/skin breakdown will have a volume reduction of 30% by week 4 Date Initiated: 5/Barnes/2023 Date Inactivated: 06/08/2022 Target Resolution Date: 06/09/2022 Goal Status: Met Ulcer/skin breakdown will have a volume reduction of 50% by week 8 Date Initiated: 06/08/2022 Target Resolution Date: 07/13/2022 Goal Status: Active Interventions: Assess ulceration(s) every visit Provide education on ulcer  and skin care Barnes Activities: Skin care regimen initiated : 5/Barnes/2023 Topical wound management initiated : 5/Barnes/2023 Notes: Electronic Signature(s) Signed: 06/08/2022 11:57:43 AM By: Nelda Marseille ([id number]) [id number] Page 5 of 8 Signed: 06/08/2022 4:24:03 PM By: Anthony Barnes Entered By: Anthony Barnes on 06/08/2022  10:Barnes:21 -------------------------------------------------------------------------------- Pain Assessment Details Patient Name: Date of Service: Anthony Barnes [name] 06/08/2022 10:00 A M Medical Record Number: [id number] Patient Account Number: [id number] Date of Birth/Sex: Treating RN: 04/08/1956 (66 y.o. Anthony Barnes Primary Care Maisyn Nouri: Anthony Barnes Other Clinician: Referring Shikita Vaillancourt: Treating Anthony Barnes Active Problems Location of Pain Severity and Description of Pain Patient Has Paino No Site Locations Pain Management and Medication Current Pain Management: Electronic Signature(s) Signed: 06/08/2022 11:57:43 AM By: Anthony Barnes Signed: 06/08/2022 4:24:03 PM By: Anthony Barnes Entered ByWorthy Barnes on 06/08/2022 10:07:05 -------------------------------------------------------------------------------- Patient/Caregiver Education Details Patient Name: Date of Service: Anthony Barnes, [place]. 10/26/2023andnbsp10:00 A M Medical Record Number: [id number] Patient Account Number: [id number] Date of Birth/Gender: Treating RN: 29-Jan-1956 (65 y.o. Anthony Barnes Primary Care Physician: Anthony Barnes Other Clinician: Referring Physician: Treating Physician/Extender: Anthony Barnes: Barnes Education Assessment Education Provided To: Patient Anthony Barnes, Anthony Barnes ([id number]) [id number] Page 6 of 8 Education Topics Provided Venous: Wound/Skin Impairment: Electronic Signature(s) Signed: 06/08/2022 11:57:43 AM By: Anthony Barnes Entered By: Anthony Barnes on 06/08/2022 10:Barnes:39 -------------------------------------------------------------------------------- Wound Assessment Details Patient Name: Date of Service: Anthony Barnes [name] 06/08/2022 10:00 A M Medical Record Number: [id number] Patient Account Number: [id number] Date of Birth/Sex:  Treating RN: 1956-04-15 (66 y.o. Anthony Barnes Primary Care Ixchel Duck: Anthony Barnes Other Clinician: Referring Anthony Barnes: Treating Anthony Barnes: Anthony Barnes: Barnes Wound Status Wound Number: 2 Primary Etiology: Venous Leg Ulcer Wound Location: Right, Lateral Lower Leg Wound Status: Open Wounding Event: Trauma Comorbid History: Hypertension, Type II Diabetes Date Acquired: 4/Barnes/2023 Weeks Of Barnes: Barnes Clustered Wound: No Photos Wound Measurements Length: (cm) 3.3 Width: (cm) 0.7 Depth: (cm) 0.1 Area: (cm) 1.814 Volume: (cm) 0.181 % Reduction in Area: -71.1% % Reduction in Volume: -70.8% Epithelialization: Small (1-33%) Tunneling: No Undermining: No Wound Description Classification: Full Thickness Without Exposed Suppor Wound Margin: Distinct, outline attached Exudate Amount: Medium Exudate Type: Serosanguineous Exudate Color: red, brown t Structures Foul Odor After Cleansing: No Slough/Fibrino Yes Wound Bed Granulation Amount: Large (67-100%) Exposed Structure Granulation Quality: Red Fascia Exposed: No Necrotic Amount: Small (1-33%) Fat Layer (Subcutaneous Tissue) Exposed: Yes Necrotic Quality: Adherent Slough Tendon Exposed: No Muscle Exposed: No Joint Exposed: No Bone Exposed: No [address] [place], [place] E ([id number]) [id number] Page 7 of 8 No Abnormalities Noted: Yes No Abnormalities Noted: Yes Moisture Temperature / Pain No Abnormalities Noted: No Temperature: No Abnormality Dry / Scaly: Yes Barnes Notes Wound #2 (Lower Leg) Wound Laterality: Right, Lateral Cleanser Soap and Water Discharge Instruction: May shower and wash wound with dial antibacterial soap and water prior to dressing change. Wound Cleanser Discharge Instruction: Cleanse the wound with wound cleanser prior to applying a clean dressing using gauze sponges, not tissue or cotton  balls. Peri-Wound Care Triamcinolone 15 (g) Discharge Instruction: Use triamcinolone 15 (g) as directed Sween Lotion (Moisturizing lotion) Discharge Instruction: Apply moisturizing lotion as directed Topical Skintegrity Hydrogel 4 (oz) Discharge Instruction: Apply hydrogel as directed Primary Dressing Hydrofera Blue Ready Foam, 4x5 in Discharge Instruction: Apply to wound bed as instructed Secondary Dressing Woven Gauze Sponge, Non-Sterile 4x4 in Discharge  Instruction: Apply over primary dressing as directed. Secured With 32M Medipore H Soft Cloth Surgical T ape, 4 x 10 (in/yd) Discharge Instruction: Secure with tape as directed. Compression Wrap ThreePress (3 layer compression wrap) Discharge Instruction: Apply three layer compression as directed. Compression Stockings Add-Ons Electronic Signature(s) Signed: 06/13/2022 4:16:20 PM By: Anthony East RN Entered By: Anthony Barnes on 06/08/2022 10:19:21 -------------------------------------------------------------------------------- Vitals Details Patient Name: Date of Service: Anthony Barnes, Anthony [name] 06/08/2022 10:00 A M Medical Record Number: [id number] Patient Account Number: [id number] Date of Birth/Sex: Treating RN: 10/Barnes/1957 (66 y.o. Anthony Barnes Primary Care Rifky Lapre: Anthony Barnes Other Clinician: Referring Henrick Mcgue: Treating Anthony Barnes: Anthony Barnes: Barnes Vital Signs Time Taken: 10:00 Temperature (F): 98.3 Height (in): 75 Pulse (bpm): 68 Weight (lbs): 310 Respiratory Rate (breaths/min): 20 Body Mass Index (BMI): 38.7 Blood Pressure (mmHg): 157/91 Reference Range: 80 - 120 mg / dl FELIBERTO, STOCKLEY ([id number]) [id number] Page 8 of 8 Electronic Signature(s) Signed: 06/08/2022 11:57:43 AM By: Anthony Barnes Entered By: Anthony Barnes on 06/08/2022 10:06:56

## 2022-06-16 ENCOUNTER — Encounter (HOSPITAL_BASED_OUTPATIENT_CLINIC_OR_DEPARTMENT_OTHER): Payer: Medicare Other | Attending: General Surgery | Admitting: General Surgery

## 2022-06-16 DIAGNOSIS — Z6838 Body mass index (BMI) 38.0-38.9, adult: Secondary | ICD-10-CM | POA: Diagnosis not present

## 2022-06-16 DIAGNOSIS — I10 Essential (primary) hypertension: Secondary | ICD-10-CM | POA: Insufficient documentation

## 2022-06-16 DIAGNOSIS — E119 Type 2 diabetes mellitus without complications: Secondary | ICD-10-CM | POA: Diagnosis not present

## 2022-06-16 DIAGNOSIS — L97812 Non-pressure chronic ulcer of other part of right lower leg with fat layer exposed: Secondary | ICD-10-CM | POA: Diagnosis not present

## 2022-06-16 DIAGNOSIS — I872 Venous insufficiency (chronic) (peripheral): Secondary | ICD-10-CM | POA: Diagnosis not present

## 2022-06-16 DIAGNOSIS — E11622 Type 2 diabetes mellitus with other skin ulcer: Secondary | ICD-10-CM | POA: Insufficient documentation

## 2022-06-22 ENCOUNTER — Encounter (HOSPITAL_BASED_OUTPATIENT_CLINIC_OR_DEPARTMENT_OTHER): Payer: Medicare Other | Admitting: General Surgery

## 2022-06-22 DIAGNOSIS — I10 Essential (primary) hypertension: Secondary | ICD-10-CM | POA: Diagnosis not present

## 2022-06-22 DIAGNOSIS — E11622 Type 2 diabetes mellitus with other skin ulcer: Secondary | ICD-10-CM | POA: Diagnosis not present

## 2022-06-22 DIAGNOSIS — I872 Venous insufficiency (chronic) (peripheral): Secondary | ICD-10-CM | POA: Diagnosis not present

## 2022-06-22 DIAGNOSIS — L97812 Non-pressure chronic ulcer of other part of right lower leg with fat layer exposed: Secondary | ICD-10-CM | POA: Diagnosis not present

## 2022-06-22 DIAGNOSIS — E119 Type 2 diabetes mellitus without complications: Secondary | ICD-10-CM | POA: Diagnosis not present

## 2022-06-26 NOTE — Progress Notes (Signed)
Anthony Barnes, Anthony Barnes ([id number]) [id number] Page 1 of 8 Visit Report for 06/22/2022 Arrival Information Details Patient Name: Date of Service: Anthony Barnes Anthony Barnes. 06/22/2022 9:15 A M Medical Record Number: [id number] Patient Account Number: [id number] Date of Birth/Sex: Treating RN: Sep 08, 1955 (66 y.o. Waldron Session Primary Care Lebron Nauert: Charlott Rakes Other Clinician: Referring Luceal Hollibaugh: Treating Nola Botkins/Extender: Carter Kitten Weeks in Treatment: 24 Visit Information History Since Last Visit Added or deleted any medications: No Patient Arrived: Ambulatory Any new allergies or adverse reactions: No Arrival Time: 09:18 Had a fall or experienced change in No Accompanied By: self activities of daily living that may affect Transfer Assistance: None risk of falls: Patient Requires Transmission-Based Precautions: No Signs or symptoms of abuse/neglect since last visito No Patient Has Alerts: No Hospitalized since last visit: No Implantable device outside of the clinic excluding No cellular tissue based products placed in the center since last visit: Has Compression in Place as Prescribed: Yes Pain Present Now: Yes Electronic Signature(s) Signed: 06/26/2022 5:04:49 PM By: Blanche East RN Entered By: Blanche East on 06/22/2022 09:18:44 -------------------------------------------------------------------------------- Compression Therapy Details Patient Name: Date of Service: Anthony Barnes, [name] Anthony Barnes. 06/22/2022 9:15 A M Medical Record Number: [id number] Patient Account Number: [id number] Date of Birth/Sex: Treating RN: 04/03/1956 (66 y.o. Waldron Session Primary Care Kayley Zeiders: Charlott Rakes Other Clinician: Referring Jaxx Huish: Treating Tilman Mcclaren/Extender: Carter Kitten Weeks in Treatment: 24 Compression Therapy Performed for Wound Assessment: Wound #2 Right,Lateral Lower Leg Performed By: Clinician Blanche East,  RN Compression Type: Three Layer Post Procedure Diagnosis Same as Pre-procedure Electronic Signature(s) Signed: 06/26/2022 5:04:49 PM By: Blanche East RN Entered By: Blanche East on 06/22/2022 09:43:22 Anthony Barnes ([id number]) [id number] Page 2 of 8 -------------------------------------------------------------------------------- Encounter Discharge Information Details Patient Name: Date of Service: Anthony Barnes Anthony Barnes. 06/22/2022 9:15 A M Medical Record Number: [id number] Patient Account Number: [id number] Date of Birth/Sex: Treating RN: August 28, 1955 (66 y.o. Waldron Session Primary Care Juana Montini: Charlott Rakes Other Clinician: Referring Jakaria Lavergne: Treating Oz Gammel/Extender: Carter Kitten Weeks in Treatment: 24 Encounter Discharge Information Items Post Procedure Vitals Discharge Condition: Stable Temperature (F): 98.4 Ambulatory Status: Ambulatory Pulse (bpm): 76 Discharge Destination: Home Respiratory Rate (breaths/min): 16 Transportation: Private Auto Blood Pressure (mmHg): 185/100 Accompanied By: self Schedule Follow-up Appointment: Yes Clinical Summary of Care: Electronic Signature(s) Signed: 06/26/2022 5:04:49 PM By: Blanche East RN Entered By: Blanche East on 06/22/2022 12:38:43 -------------------------------------------------------------------------------- Lower Extremity Assessment Details Patient Name: Date of Service: Anthony Barnes Anthony Barnes. 06/22/2022 9:15 A M Medical Record Number: [id number] Patient Account Number: [id number] Date of Birth/Sex: Treating RN: 08/19/1955 (66 y.o. Waldron Session Primary Care Lylith Bebeau: Charlott Rakes Other Clinician: Referring Kailin Principato: Treating Bruin Bolger/Extender: Carter Kitten Weeks in Treatment: 24 Edema Assessment Assessed: [Left: No] [Right: No] [Left: Edema] [Right: :] Calf Left: Right: Point of Measurement: From Medial Instep 43 cm Ankle Left:  Right: Point of Measurement: From Medial Instep 24 cm Vascular Assessment Pulses: Dorsalis Pedis Palpable: [Right:Yes] Electronic Signature(s) Signed: 06/26/2022 5:04:49 PM By: Blanche East RN Entered By: Blanche East on 06/22/2022 09:32:27 Multi Wound Chart Details -------------------------------------------------------------------------------- Anthony Barnes ([id number]) [id number] Page 3 of 8 Patient Name: Date of Service: Anthony Barnes Anthony Barnes. 06/22/2022 9:15 A M Medical Record Number: [id number] Patient Account Number: [id number] Date of Birth/Sex: Treating RN: 1956/07/19 (66 y.o. M) Primary Care Ariauna Farabee: Charlott Rakes Other Clinician: Referring Alonzo Owczarzak: Treating Perrie Ragin/Extender: Carter Kitten Weeks in Treatment: 24 Vital Signs Height(in): 75 Pulse(bpm): 76  Weight(lbs): 310 Blood Pressure(mmHg): 185/100 Body Mass Index(BMI): 38.7 Temperature(F): 98.4 Respiratory Rate(breaths/min): 16 Wound Assessments Wound Number: 2 N/A N/A Photos: N/A N/A Right, Lateral Lower Leg N/A N/A Wound Location: Trauma N/A N/A Wounding Event: Venous Leg Ulcer N/A N/A Primary Etiology: Hypertension, Type II Diabetes N/A N/A Comorbid History: 12/03/2021 N/A N/A Date Acquired: 10 N/A N/A Weeks of Treatment: Open N/A N/A Wound Status: No N/A N/A Wound Recurrence: 3.1x0.8x0.1 N/A N/A Measurements L x W x D (cm) 1.948 N/A N/A A (cm) : rea 0.195 N/A N/A Volume (cm) : -83.80% N/A N/A % Reduction in A rea: -84.00% N/A N/A % Reduction in Volume: Full Thickness Without Exposed N/A N/A Classification: Support Structures Medium N/A N/A Exudate A mount: Serosanguineous N/A N/A Exudate Type: red, brown N/A N/A Exudate Color: Distinct, outline attached N/A N/A Wound Margin: Medium (34-66%) N/A N/A Granulation A mount: Red N/A N/A Granulation Quality: Medium (34-66%) N/A N/A Necrotic A mount: Fat Layer (Subcutaneous  Tissue): Yes N/A N/A Exposed Structures: Fascia: No Tendon: No Muscle: No Joint: No Bone: No Medium (34-66%) N/A N/A Epithelialization: Debridement - Selective/Open Wound N/A N/A Debridement: Pre-procedure Verification/Time Out 09:40 N/A N/A Taken: Lidocaine 5% topical ointment N/A N/A Pain Control: Necrotic/Eschar, Slough N/A N/A Tissue Debrided: Non-Viable Tissue N/A N/A Level: 2.48 N/A N/A Debridement A (sq cm): rea Curette N/A N/A Instrument: Minimum N/A N/A Bleeding: Pressure N/A N/A Hemostasis A chieved: Procedure was tolerated well N/A N/A Debridement Treatment Response: 3.1x0.8x0.1 N/A N/A Post Debridement Measurements L x W x D (cm) 0.195 N/A N/A Post Debridement Volume: (cm) No Abnormalities Noted N/A N/A Periwound Skin Texture: Dry/Scaly: Yes N/A N/A Periwound Skin Moisture: No Abnormalities Noted N/A N/A Periwound Skin Color: No Abnormality N/A N/A Temperature: Compression Therapy N/A N/A Procedures Performed: Debridement Treatment Notes Electronic Signature(s) Signed: 06/22/2022 9:51:07 AM By: Fredirick Maudlin MD FACS Anthony Barnes, Anthony Barnes ([id number]) By: Fredirick Maudlin MD [place] [phone].pdf Page 4 of 8 Signed: 06/22/2022 9:51:07 AM Entered By: Fredirick Maudlin on 06/22/2022 09:51:07 -------------------------------------------------------------------------------- Multi-Disciplinary Care Plan Details Patient Name: Date of Service: Anthony Barnes Anthony Barnes. 06/22/2022 9:15 A M Medical Record Number: [id number] Patient Account Number: [id number] Date of Birth/Sex: Treating RN: [birth year] (66 y.o. Waldron Session Primary Care Braeden Kennan: Charlott Rakes Other Clinician: Referring Alyan Hartline: Treating Milanie Rosenfield/Extender: Carter Kitten Weeks in Treatment: 24 Active Inactive Venous Leg Ulcer Nursing Diagnoses: Actual venous Insuffiency (use after diagnosis is confirmed) Knowledge deficit related to disease process  and management Goals: Patient will maintain optimal edema control Date Initiated: 01/02/2022 Target Resolution Date: 07/13/2022 Goal Status: Active Patient/caregiver will verbalize understanding of disease process and disease management Date Initiated: 01/02/2022 Date Inactivated: 03/07/2022 Target Resolution Date: 03/09/2022 Goal Status: Met Interventions: Assess peripheral edema status every visit. Compression as ordered Provide education on venous insufficiency Treatment Activities: Non-invasive vascular studies : 01/02/2022 T ordered outside of clinic : 01/02/2022 est Therapeutic compression applied : 01/02/2022 Notes: Wound/Skin Impairment Nursing Diagnoses: Impaired tissue integrity Knowledge deficit related to ulceration/compromised skin integrity Goals: Patient/caregiver will verbalize understanding of skin care regimen Date Initiated: 01/02/2022 Date Inactivated: 02/02/2022 Target Resolution Date: 02/03/2022 Goal Status: Met Ulcer/skin breakdown will have a volume reduction of 30% by week 4 Date Initiated: 01/02/2022 Date Inactivated: 06/08/2022 Target Resolution Date: 06/09/2022 Goal Status: Met Ulcer/skin breakdown will have a volume reduction of 50% by week 8 Date Initiated: 06/08/2022 Target Resolution Date: 07/13/2022 Goal Status: Active Interventions: Assess ulceration(s) every visit Provide education on ulcer and skin care Treatment Activities: Skin care  regimen initiated : 01/02/2022 Topical wound management initiated : 01/02/2022 Notes: Anthony Barnes, Anthony Barnes ([id number]) [id number] Page 5 of 8 Electronic Signature(s) Signed: 06/26/2022 5:04:49 PM By: Blanche East RN Entered By: Blanche East on 06/22/2022 09:33:16 -------------------------------------------------------------------------------- Pain Assessment Details Patient Name: Date of Service: Anthony Barnes Anthony Barnes. 06/22/2022 9:15 A M Medical Record Number: [id number] Patient  Account Number: [id number] Date of Birth/Sex: Treating RN: Jun 20, 1956 (67 y.o. Waldron Session Primary Care Yama Nielson: Charlott Rakes Other Clinician: Referring Minard Millirons: Treating Zalma Channing/Extender: Carter Kitten Weeks in Treatment: 24 Active Problems Location of Pain Severity and Description of Pain Patient Has Paino No Site Locations Pain Management and Medication Current Pain Management: Electronic Signature(s) Signed: 06/26/2022 5:04:49 PM By: Blanche East RN Entered By: Blanche East on 06/22/2022 09:32:14 -------------------------------------------------------------------------------- Patient/Caregiver Education Details Patient Name: Date of Service: Anthony Barnes, [place] 11/9/2023andnbsp9:15 A M Medical Record Number: [id number] Patient Account Number: [id number] Date of Birth/Gender: Treating RN: [birth year] (66 y.o. Waldron Session Primary Care Physician: Charlott Rakes Other Clinician: Referring Physician: Treating Physician/Extender: Carter Kitten Weeks in Treatment: 24 Education Assessment Education Provided To: Patient ZYKEE, Anthony Barnes ([id number]) [id number] Page 6 of 8 Education Topics Provided Venous: Methods: Explain/Verbal Responses: Reinforcements needed, State content correctly Wound/Skin Impairment: Methods: Explain/Verbal Responses: Reinforcements needed, State content correctly Electronic Signature(s) Signed: 06/26/2022 5:04:49 PM By: Blanche East RN Entered By: Blanche East on 06/22/2022 09:33:36 -------------------------------------------------------------------------------- Wound Assessment Details Patient Name: Date of Service: Anthony Barnes, [name] Anthony Barnes. 06/22/2022 9:15 A M Medical Record Number: [id number] Patient Account Number: [id number] Date of Birth/Sex: Treating RN: 09-Nov-1955 (66 y.o. Waldron Session Primary Care Viviano Bir: Charlott Rakes Other Clinician: Referring  Knut Rondinelli: Treating Litzi Binning/Extender: Carter Kitten Weeks in Treatment: 24 Wound Status Wound Number: 2 Primary Etiology: Venous Leg Ulcer Wound Location: Right, Lateral Lower Leg Wound Status: Open Wounding Event: Trauma Comorbid History: Hypertension, Type II Diabetes Date Acquired: 12/03/2021 Weeks Of Treatment: 24 Clustered Wound: No Photos Wound Measurements Length: (cm) 3.1 Width: (cm) 0.8 Depth: (cm) 0.1 Area: (cm) 1.948 Volume: (cm) 0.195 % Reduction in Area: -83.8% % Reduction in Volume: -84% Epithelialization: Medium (34-66%) Tunneling: No Undermining: No Wound Description Classification: Full Thickness Without Exposed Suppor Wound Margin: Distinct, outline attached Exudate Amount: Medium Exudate Type: Serosanguineous Exudate Color: red, brown t Structures Foul Odor After Cleansing: No Slough/Fibrino Yes Wound Bed Granulation Amount: Medium (34-66%) Exposed Structure Granulation Quality: Red Fascia Exposed: No Necrotic Amount: Medium (34-66%) Fat Layer (Subcutaneous Tissue) Exposed: Yes Necrotic Quality: Adherent Slough Tendon Exposed: No Anthony Barnes, Anthony Barnes ([id number]) [id number] Page 7 of 8 Muscle Exposed: No Joint Exposed: No Bone Exposed: No Periwound Skin Texture Texture Color No Abnormalities Noted: Yes No Abnormalities Noted: Yes Moisture Temperature / Pain No Abnormalities Noted: No Temperature: No Abnormality Dry / Scaly: Yes Treatment Notes Wound #2 (Lower Leg) Wound Laterality: Right, Lateral Cleanser Soap and Water Discharge Instruction: May shower and wash wound with dial antibacterial soap and water prior to dressing change. Wound Cleanser Discharge Instruction: Cleanse the wound with wound cleanser prior to applying a clean dressing using gauze sponges, not tissue or cotton balls. Peri-Wound Care Triamcinolone 15 (g) Discharge Instruction: Use triamcinolone 15 (g) as directed Sween  Lotion (Moisturizing lotion) Discharge Instruction: Apply moisturizing lotion as directed Topical Primary Dressing Endoform 2x2 in Discharge Instruction: Moisten with saline Secondary Dressing Woven Gauze Sponge, Non-Sterile 4x4 in Discharge Instruction: Apply over primary dressing as directed. Secured With 71M Medipore H Soft Cloth Surgical T ape, 4  x 10 (in/yd) Discharge Instruction: Secure with tape as directed. Compression Wrap ThreePress (3 layer compression wrap) Discharge Instruction: Apply three layer compression as directed. Compression Stockings Add-Ons Electronic Signature(s) Signed: 06/26/2022 5:04:49 PM By: Blanche East RN Entered By: Blanche East on 06/22/2022 09:35:03 -------------------------------------------------------------------------------- Vitals Details Patient Name: Date of Service: Anthony Barnes, [name] Anthony Barnes. 06/22/2022 9:15 A M Medical Record Number: [id number] Patient Account Number: [id number] Date of Birth/Sex: Treating RN: Oct 17, 1955 (66 y.o. Waldron Session Primary Care Hemi Chacko: Charlott Rakes Other Clinician: Referring Sharon Rubis: Treating Vlad Mayberry/Extender: Carter Kitten Weeks in Treatment: 24 Vital Signs Time Taken: 09:31 Temperature (F): 98.4 Height (in): 75 Pulse (bpm): 76 Anthony Barnes, Anthony Barnes ([id number]) [id number] Page 8 of 8 Weight (lbs): 310 Respiratory Rate (breaths/min): 16 Body Mass Index (BMI): 38.7 Blood Pressure (mmHg): 185/100 Reference Range: 80 - 120 mg / dl Notes Bp is elevated; pt stated he did not take BP meds this morning Electronic Signature(s) Signed: 06/26/2022 5:04:49 PM By: Blanche East RN Entered By: Blanche East on 06/22/2022 09:32:08

## 2022-06-26 NOTE — Progress Notes (Signed)
WILLOW, RECZEK ([id number]) [id number] Page 1 of 13 Visit Report for 06/22/2022 Chief Complaint Document Details Patient Name: Date of Service: [name] 06/22/2022 9:15 A M Medical Record Number: [id number] Patient Account Number: [id number] Date of Birth/Sex: Treating RN: 05/08/1956 (66 y.o. M) Primary Care Provider: Hoy Register Other Clinician: Referring Provider: Treating Provider/Extender: Camillo Flaming Weeks in Treatment: 24 Information Obtained from: Patient Chief Complaint Right LE Ulcer Electronic Signature(s) Signed: 06/22/2022 9:52:04 AM By: Duanne Guess MD FACS Entered By: Duanne Guess on 06/22/2022 09:52:04 -------------------------------------------------------------------------------- Debridement Details Patient Name: Date of Service: Anthony Barnes, Anthony [name] 06/22/2022 9:15 A M Medical Record Number: [id number] Patient Account Number: [id number] Date of Birth/Sex: Treating RN: [birth year] (66 y.o. Valma Cava Primary Care Provider: Hoy Register Other Clinician: Referring Provider: Treating Provider/Extender: Camillo Flaming Weeks in Treatment: 24 Debridement Performed for Assessment: Wound #2 Right,Lateral Lower Leg Performed By: Physician Duanne Guess, MD Debridement Type: Debridement Severity of Tissue Pre Debridement: Fat layer exposed Level of Consciousness (Pre-procedure): Awake and Alert Pre-procedure Verification/Time Out Yes - 09:40 Taken: Start Time: 09:41 Pain Control: Lidocaine 5% topical ointment T Area Debrided (L x W): otal 3.1 (cm) x 0.8 (cm) = 2.48 (cm) Tissue and other material debrided: Non-Viable, Eschar, Slough, Slough Level: Non-Viable Tissue Debridement Description: Selective/Open Wound Instrument: Curette Bleeding: Minimum Hemostasis Achieved: Pressure Response to Treatment: Procedure was tolerated well Level of Consciousness (Post- Awake  and Alert procedure): Post Debridement Measurements of Total Wound Length: (cm) 3.1 Width: (cm) 0.8 Depth: (cm) 0.1 Volume: (cm) 0.195 Character of Wound/Ulcer Post Debridement: Requires Further Debridement Severity of Tissue Post Debridement: Fat layer exposed Post Procedure Diagnosis Same as Pre-procedure MECCA, BARGA ([id number]) [id number] Page 2 of 13 Notes Scribed for Dr. Lady Gary by Tommie Ard, RN Electronic Signature(s) Signed: 06/22/2022 10:06:11 AM By: Duanne Guess MD FACS Signed: 06/26/2022 5:04:49 PM By: Tommie Ard RN Entered By: Tommie Ard on 06/22/2022 09:43:05 -------------------------------------------------------------------------------- HPI Details Patient Name: Date of Service: Anthony Barnes, Anthony [name] 06/22/2022 9:15 A M Medical Record Number: [id number] Patient Account Number: [id number] Date of Birth/Sex: Treating RN: 1955/09/03 (66 y.o. M) Primary Care Provider: Hoy Register Other Clinician: Referring Provider: Treating Provider/Extender: Camillo Flaming Weeks in Treatment: 24 History of Present Illness HPI Description: ADMISSION 03/12/18 This is a 66 year old and it was a type II diabetic reasonably poorly controlled with a recent hemoglobin A1c of 8.2. He was tending to his lawn on 01/15/18 when his weed [name] sent a rock up word hitting him in the right anterior leg. He was seen in his primary M.D. office is same time. An x-ray showed no abnormality. He was given a course of cephalexin. Since then he's been applying peroxide and topical antibiotics to the wound. He does not have a history of chronic wounds however he does have chronic edema in the lower legs. He is complaining of pain in the right leg wound but no real history of claudication. Past medical history includes type 2 diabetes, hypertension, morbid obesity. ABIs in the right leg were noncompressible 03/19/18 on evaluation today patient  appears to be tolerating the wrap in general fairly well. He states he's not having that much pain in regard to the wound itself. With that being said he is having some issues with slough buildup on the surface of the wound the Iodoflex does seem to be helpful in that regard. He is still having discomfort he wonders if I can send in a  prescription for ibuprofen or something of like to help him out. I do believe that the prox end may be of benefit for him. 03/25/18 on evaluation today patient appears to be doing very well in regard to his right lateral lower [name]. He's been tolerating the dressing changes without complication that is the wraps. Nonetheless he does continue to have pain he states is been dreading the possibility of having to have debridement again today. His first debridement experience was not optimal. With that being said he does seem to be showing signs of improvement there does appear to be little bit more granulation he does have a lot of slough that really does need to breeding away. 04/04/18;the patient went for arterial studies that were really quite normal. ABI on the right at 1.19 with triphasic waveforms on the left 1.11 with triphasic waveforms. TBI 0.93 on the right and 0.95 on the left. This suggests T should be able to tolerate even 4 layer compression. He is however complaining of a lot of pain with our wraps I'm wondering whether the pain is simply Iodoflex. I changed him to [employer] I'm still going to try to keep him in 3 layer compression 04/12/18 on evaluation today patient actually appears to be doing rather well in regard to his right lower [name] ulcer. He is making good progress although it is slow still I do believe that the Santyl is much better than the Iodoflex. The good news is the patient seems to be tolerating the dressing changes without complication now that we've gotten good of the Iodoflex which really did burn  him quite significantly. Otherwise there's no evidence of infection. 04/17/18 on evaluation today patient actually appears to be showing signs of improvement in regard to the ulcer. Unfortunately he's had somewhat of a rough day due to the fact that he found out that one of his friends suddenly and unexpectedly passed today. He states he's not sure he's in the frame of mind to allow me to debride the wound at this point. Nonetheless I do feel like he is showing signs of the wound getting better little by little each week. 04/24/18 on evaluation today patient's ulcer actually appears to be showing some signs of improvement albeit slow. He has been tolerating the dressing changes without complication except for the wrap which she states was so tight that he had to remove it it was causing a lot of discomfort. Fortunately there is no evidence of infection at this point. He states he only wore the wrap until about the time he got home last week and that he had to take it off. Nonetheless he does not have any other compression stockings, Juxta-Lite wraps, or anything otherwise to really help at this point as far as compression is concerned. 05/01/18 on evaluation today patient actually appears to be doing fairly well in regard to his lower extremity ulcer. He has been tolerating the dressing changes currently without complication. The wrap does seem to be helping as far as the fluid management is concerned. Wound bed itself actually show signs of good improvement although there is some Slough noted there's not as much as previous and he actually has fairly decent granulation noted as well. Overall I'm pleased with the progress of to this point. The patient would prefer not to have any debridement today he states he's actually not feeling too well in general and he really does not want any additional discomfort typically debridement is  fairly uncomfortable for him. 05/08/18 on evaluation today patient actually  appears to be doing a little better in regard to his lower extremity ulcer. He has been tolerating the dressing changes without complication. With that being said I'm actually quite pleased with the fact the wound is not appear to be infected and in general has made a little progress. With that being said I do believe we need to perform some debridement to clean away the necrotic tissue on the surface of the wound. 05/15/18 on evaluation today patient actually appears to be doing a little better in regard to his wound. Fortunately there is some improvement noted fortunately there's also no significant evidence of infection at this point in time. He does have continued pain that really nothing different than previous he did get his Juxta-Lite wrap. 05/29/18 on evaluation today patient actually appears to be doing somewhat better in regard to his wound currently. He's been tolerating the dressing changes without complication. With that being said he has been performing the [facility] Dressing changes every day at home. I'm pretty sure that we told him DEMERE, DOTZLER ([id number]) [id number] Page 3 of 13 every other day last week or when I saw him rather two weeks ago. With that being said obviously did not hurt to do it daily just that obviously is gonna run out of supplies sooner and that could get him into trouble as far as his insurance is concerned. Nonetheless he at this point still continues to have pain although honestly I don't feel like it's as bad as what it was in the past just based on his reactions at this point. 06/12/18 on evaluation today patient actually appears to be doing better in regard to his lower [name] ulcer. He's been tolerating the dressing changes without complication. Fortunately there does not appear to be any evidence of infection at this time. No fevers, chills, nausea, or vomiting noted at this time. 06/19/18 evaluation today patient's  ulcer on the lower extremity appears to be doing better at this point. he has been tolerating the dressing changes. The patient seems to be somewhat depressed about the progress of his wound although the overall appearance at this time seems to be doing well. In general I'm very pleased with the appearance today he does have some biofilm on the surface of the wound as well as of hyper granular tissue we may want to utilize a little bit of silver nitrate today. 06/27/2018; patient comes into clinic today for a wound on the right lateral calf likely related to chronic venous insufficiency. He has been using medihoney covered with Hydrofera Blue. Wound surface actually looks quite good 07/03/2018 seen today for follow-up and management of lower extremity ulcer right lateral shin. T olerating dressing changes. He has a small amount of biofilm today. Will attempt to remove a portion of the biofilm as he tolerates. He becomes very anxious with wound treatments. He would benefit from layer compression wraps however he refuses compression wraps or anything that smokes his legs. He expressed an inability to tolerate compression wraps. Juxta lite wraps have been ordered. He has been instructed on the appropriate application of the juxta leg wraps. His blood pressure today on visit was 200/120. He denies any blurred vision, chest pain, dizziness, shortness of breath, or difficulty with mobility. Mr. Morones stated that he had a recent visit with his primary care provider. At that time his carvedilol was decreased from 25 mg daily to 12.5 mg daily. Strongly  encouraged Mr. Opdahl to seek medical attention today during visit. He declined transport for evaluation of elevated blood pressure. Encouraged him to contact his primary care provider today for medication management. He stated that he would call his primary care doctor after wound visit today. Also encouraged him that if he experienced any symptoms of blurred  vision, chest pain, shortness of breath to immediately seek medical attention. 07/17/18 on evaluation today patient's wound actually does seem to show signs of improvement based on the overall appearance of the wound bed today. There does not appear to show any signs of infection which is good news. There is no overall worsening which is also good news. He still has hyper granular tissue will use in the [employer] followed by [facility] Dressing this point. 07/31/18 on evaluation today patient's wound bed actually show signs of improvement with good epithelialization especially in the upper portion of the wound. He has been tolerating the dressing changes without complication in general. Overall I'm extremely happy with how things stand. No fevers, chills, nausea, or vomiting noted at this time. 08/28/18 on evaluation today patient appears to be doing a little better in regard to his lower extremity ulcer. With that being said it appears to be very hyper granular I think this is directly attributed to the fact that he continues to use the [employer] underneath the [facility] Dressing. Although this helps not to stick unfortunately also think he's not getting the benefit of the [facility] Dressing particular and subsequently is causing too much moisture buildup hence the hyper granulation. Fortunately there does not appear to be any signs of infection at this time which is good news. 09/03/18 on evaluation today patient appears to be doing well in regard to his lower [name] ulcer. He has been tolerating the dressing changes without complication. Fortunately he has much less hyper granular tissue the noted last week I do think using silver nitrate in changing to using the [facility] Dressing without the mepitel has made a big difference for him this is good news. 09/18/18 on evaluation today patient appears to be doing excellent in regard to his right lower [name] ulcer. This is actually  significantly smaller even compared to the last evaluation. Overall I'm very pleased in this regard. I'm a recommend that we likely repeat the silver nitrate today based on what I'm seeing. 10/02/18 on evaluation today patient appears to be doing excellent in regard to his lower extremity wound on the right. He's been tolerating the dressing changes without complication. Fortunately there's no signs of infection at this time. Overall been very pleased with how things seem to be progressing. No fevers, chills, nausea, or vomiting noted at this time. 10/16/18 on evaluation today patient actually appears to be doing much better in regard to his lower extremity wound on the right. This is shown signs of good improvement and is indeed measuring smaller he is on a excellent track as far as healing is concerned. My hope is this will be healed of the next several weeks. Fortunately there is no evidence of infection at this time. He does seem to be doing everything that I'm recommending for him 10/30/18 on evaluation today patient appears to be doing better in regard to his lower extremity ulcer. He's been tolerating the dressing changes without complication. Fortunately the Hydrofera Blue Dressing to be doing the job. He has made excellent progress. With that being said this is still going somewhat slow but nonetheless is always making good  progress at this point. 5/18-Patient was in to be seen for right leg ulcer that appeared after scab above it was removed today. This area had healed completely. 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 The wound has not healed though it has been nearly 2 months since the injury occurred. He was seen  in the emergency department at [facility] on May 9. At that time it was very painful and he described it as a "15 on a scale of 010". He also was found to have 3+ pitting edema to the bilateral lower extremities. He was prescribed a weeks course of Keflex. He is here today because the wound has failed to heal and he has a prior history in our clinic. ABI in clinic today was noncompressible. He did have formal vascular studies performed in 2019 which were normal. The last hemoglobin A1c I have available for review was from March 21, 2021 and was elevated at 7.7%. The wound is fairly small and circular located about 3 inches above his right lateral malleolus. It is completely covered with eschar. No surrounding erythema, no odor, no purulent drainage. 01/11/2022: The wound on his right lateral leg is a little bit smaller today. It has reaccumulated some slough and a small bit of eschar. It remains fairly painful. He has another area on his anterior tibia that he will not let me touch but I am concerned that there is a wound forming underneath the surface. 01/18/2022: Unfortunately, his wound is a little bit bigger today. He continues to accumulate slough on the wound surface. It is still quite tender. 01/25/2022: The patient bumped his wound on the car door which resulted in bleeding. He was seen at urgent care where it was redressed. Unfortunately, the wound is bigger again today. There is accumulated slough on the surface. Urgent care gave him some tramadol, apparently and he took this prior to his visit so he could tolerate debridement better. 02/02/2022: The wound is a little bit smaller today. The surface, however, has accumulated a fairly thick layer of slough. The periwound skin is intact and there is no obvious sign of infection. 02/07/2022: The wound is a bit larger today. It has thick slough accumulation. The periwound skin is intact and there is no obvious erythema, induration, nor  any odor. 02/16/2022: I took a culture last week due to the appearance of the wound and the patient's degree of pain. This grew out methicillin sensitive Staph aureus. Augmentin was prescribed. The patient states that the pain is markedly improved today. The wound also looks much better. It is smaller with less slough buildup and there is good granulation tissue emerging. 02/21/2022: The patient's pain is quite improved from prior visits. The wound is about the same size and has some accumulated slough on the surface. The base KNOWLEDGE, ESCANDON ([id number]) [id number] Page 4 of 13 is a little bit fibrotic but there are some areas of granulation tissue filling in. 02/28/2022: The wound is a little bit narrower on its measurements but slightly longer. There is still some slough accumulation on the surface, but he has minimal pain and there is no concern for ongoing infection. Granulation tissue is emerging. 03/07/2022: During the week, he cut  holes in his wrap because it was painful and too tight. As a result, his leg was quite a bit more swollen today and his wound was larger. The wound surface is fairly clean but a little bit dry. Minimal slough accumulation. Granulation tissue present. 03/14/2022: Once again, he did some arts and crafts projects with his wrap. On the other hand, however, his wound does look better. It is smaller today and is beginning to fill in. Minimal slough and a small amount of eschar accumulation. 03/27/2022: His wound measured bigger today, although it does not appear to be so on my impression. The wound continues to fill with granulation tissue. There is a little bit of slough and eschar accumulation. He continues to cut his wrap off of his feet and his edema control at the ankle is particularly poor with 3+ pitting edema. His insurance will not cover any sort of skin substitute unfortunately. 04/04/2022: The wound is smaller today. It is more  superficial with good granulation tissue and just a little bit of slough. He continues to cut away portions of his wrap which results in inadequate edema control. 04/11/2022: His wound measured a couple millimeters larger today. It continues to fill with granulation tissue and there is minimal slough on the surface. 04/19/2022: No significant change in the wound dimensions, but the surface is healthier and less fibrotic. 04/26/2022: The wound is a little bit bigger again today. There is slough accumulation on the surface. Edema control is inadequate. He has been cutting the foot portion of his wrap off each week. 05/03/2022: The wound is a little smaller this week. There is still a fair amount of slough on the surface. His drainage was a little bit as greenish today, although not the blue-green typically associated with Pseudomonas. No significant odor. The underlying granulation tissue appears healthy. The culture that I took last week grew out methicillin sensitive Staph aureus. 05/10/2022: The wound is again a little bit smaller this week. He still has slough accumulation on the surface. Most of the surface has fairly decent granulation tissue, but there is still a fibrotic portion in the center near the caudal aspect of the wound. He did not pick up the Augmentin that I prescribed for his MSSA infection. He says he will get it today. 05/19/2022: His wound is smaller by half a centimeter today. There is a little bit of slough buildup, but less so than at previous visits. He has not been taking his Augmentin properly. He has been splitting the tablets in half and taking them on an irregular schedule. He says this is because it has been upsetting his stomach. 05/25/2022: His wound is a little bit smaller again this week. Very minimal slough accumulation. The wound is drier than I would like to see. He completed his course of Augmentin. 10/19; patient has a wound on the right lateral lower leg in the  setting of obvious chronic venous insufficiency. He has had a previous wound on the anterior leg that we dealt with several years ago. He has been using Hydrofera Blue. Kerlix and Coban 06/08/2022: The wound is smaller again this week. There is some light slough accumulation. It remains a little dry. 06/16/2022: Although the wound dimensions were the same this week, on visual inspection it appears smaller. It is clean, but still dry despite the use of hydrogel. 06/22/2022: His wound responded very well to endoform. It is smaller, moisture balance is excellent, and the surface has lovely granulation tissue. Electronic Signature(s) Signed:  06/22/2022 9:53:07 AM By: Duanne Guess MD FACS Entered By: Duanne Guess on 06/22/2022 09:53:07 -------------------------------------------------------------------------------- Physical Exam Details Patient Name: Date of Service: Anthony Barnes, Anthony [name] 06/22/2022 9:15 A M Medical Record Number: [id number] Patient Account Number: [id number] Date of Birth/Sex: Treating RN: 03-25-1956 (66 y.o. M) Primary Care Provider: Hoy Register Other Clinician: Referring Provider: Treating Provider/Extender: Camillo Flaming Weeks in Treatment: 24 Constitutional Hypertensive, asymptomatic. He did not take his blood pressure medicine today.. . . . No acute distress.Marland Kitchen Respiratory Normal work of breathing on room air.. Notes 06/22/2022: His wound responded very well to endoform. It is smaller, moisture balance is excellent, and the surface has lovely granulation tissue. Electronic Signature(s) Signed: 06/22/2022 9:54:02 AM By: Duanne Guess MD FACS Arnetha Massy ([id number]) [id number] Page 5 of 13 Entered By: Duanne Guess on 06/22/2022 09:54:02 -------------------------------------------------------------------------------- Physician Orders Details Patient Name: Date of Service: [name] 06/22/2022  9:15 A M Medical Record Number: [id number] Patient Account Number: [id number] Date of Birth/Sex: Treating RN: Aug 10, 1956 (66 y.o. Valma Cava Primary Care Provider: Hoy Register Other Clinician: Referring Provider: Treating Provider/Extender: Camillo Flaming Weeks in Treatment: 24 Verbal / Phone Orders: No Diagnosis Coding ICD-10 Coding Code Description [phone] Non-pressure chronic ulcer of other part of right lower leg with fat layer exposed E11.622 Type 2 diabetes mellitus with other skin ulcer E11.9 Type 2 diabetes mellitus without complications I10 Essential (primary) hypertension I87.2 Venous insufficiency (chronic) (peripheral) Follow-up Appointments ppointment in 1 week. - Dr. Lady Gary - room 2 Return A Anesthetic Wound #2 Right,Lateral Lower Leg (In clinic) Topical Lidocaine 5% applied to wound bed Bathing/ Shower/ Hygiene May shower with protection but do not get wound dressing(s) wet. - may purchase a cast protector from [facility] or [facility] Edema Control - Lymphedema / SCD / Other Elevate legs to the level of the heart or above for 30 minutes daily and/or when sitting, a frequency of: Avoid standing for long periods of time. Patient to wear own compression stockings every day. Exercise regularly Moisturize legs daily. Compression stocking or Garment 20-30 mm/Hg pressure to: - to L left until R leg is healed Wound Treatment Wound #2 - Lower Leg Wound Laterality: Right, Lateral Cleanser: Soap and Water 1 x Per Week/30 Days Discharge Instructions: May shower and wash wound with dial antibacterial soap and water prior to dressing change. Cleanser: Wound Cleanser 1 x Per Week/30 Days Discharge Instructions: Cleanse the wound with wound cleanser prior to applying a clean dressing using gauze sponges, not tissue or cotton balls. Peri-Wound Care: Triamcinolone 15 (g) 1 x Per Week/30 Days Discharge Instructions: Use triamcinolone 15 (g) as  directed Peri-Wound Care: Sween Lotion (Moisturizing lotion) 1 x Per Week/30 Days Discharge Instructions: Apply moisturizing lotion as directed Prim Dressing: Endoform 2x2 in 1 x Per Week/30 Days ary Discharge Instructions: Moisten with saline Secondary Dressing: Woven Gauze Sponge, Non-Sterile 4x4 in 1 x Per Week/30 Days Discharge Instructions: Apply over primary dressing as directed. Secured With: 40M Medipore H Soft Cloth Surgical T ape, 4 x 10 (in/yd) 1 x Per Week/30 Days Discharge Instructions: Secure with tape as directed. Compression Wrap: ThreePress (3 layer compression wrap) 1 x Per Week/30 Days Discharge Instructions: Apply three layer compression as directed. PADDY, NEIS ([id number]) [id number] Page 6 of 13 Electronic Signature(s) Signed: 06/22/2022 10:06:11 AM By: Duanne Guess MD FACS Entered By: Duanne Guess on 06/22/2022 09:55:12 -------------------------------------------------------------------------------- Problem List Details Patient Name: Date of Service: Anthony Barnes, Anthony [name] 06/22/2022  9:15 A M Medical Record Number: [id number] Patient Account Number: [id number] Date of Birth/Sex: Treating RN: 1955-09-08 (66 y.o. M) Primary Care Provider: Hoy Register Other Clinician: Referring Provider: Treating Provider/Extender: Whitney Muse, Odette Horns Weeks in Treatment: 24 Active Problems ICD-10 Encounter Code Description Active Date MDM Diagnosis L97.812 Non-pressure chronic ulcer of other part of right lower leg with fat layer 01/02/2022 No Yes exposed E11.622 Type 2 diabetes mellitus with other skin ulcer 01/02/2022 No Yes E11.9 Type 2 diabetes mellitus without complications 01/02/2022 No Yes I10 Essential (primary) hypertension 01/02/2022 No Yes I87.2 Venous insufficiency (chronic) (peripheral) 01/02/2022 No Yes Inactive Problems Resolved Problems Electronic Signature(s) Signed: 06/22/2022 9:50:59 AM By: Duanne Guess MD FACS Entered By: Duanne Guess on 06/22/2022 09:50:58 -------------------------------------------------------------------------------- Progress Note Details Patient Name: Date of Service: Anthony Barnes, Anthony [name] 06/22/2022 9:15 A M Medical Record Number: [id number] Patient Account Number: [id number] Date of Birth/Sex: Treating RN: January 26, 1956 (66 y.o. M) Primary Care Provider: Hoy Register Other Clinician: Referring Provider: Treating Provider/Extender: Camillo Flaming Weeks in Treatment: [address] PERNELL, LENOIR ([id number]) [id number] Page 7 of 13 Chief Complaint Information obtained from Patient Right LE Ulcer History of Present Illness (HPI) ADMISSION 03/12/18 This is a 66 year old and it was a type II diabetic reasonably poorly controlled with a recent hemoglobin A1c of 8.2. He was tending to his lawn on 01/15/18 when his weed [name] sent a rock up word hitting him in the right anterior leg. He was seen in his primary M.D. office is same time. An x-ray showed no abnormality. He was given a course of cephalexin. Since then he's been applying peroxide and topical antibiotics to the wound. He does not have a history of chronic wounds however he does have chronic edema in the lower legs. He is complaining of pain in the right leg wound but no real history of claudication. Past medical history includes type 2 diabetes, hypertension, morbid obesity. ABIs in the right leg were noncompressible 03/19/18 on evaluation today patient appears to be tolerating the wrap in general fairly well. He states he's not having that much pain in regard to the wound itself. With that being said he is having some issues with slough buildup on the surface of the wound the Iodoflex does seem to be helpful in that regard. He is still having discomfort he wonders if I can send in a prescription for ibuprofen or something of like to help him out. I do  believe that the prox end may be of benefit for him. 03/25/18 on evaluation today patient appears to be doing very well in regard to his right lateral lower [name]. He's been tolerating the dressing changes without complication that is the wraps. Nonetheless he does continue to have pain he states is been dreading the possibility of having to have debridement again today. His first debridement experience was not optimal. With that being said he does seem to be showing signs of improvement there does appear to be little bit more granulation he does have a lot of slough that really does need to breeding away. 04/04/18;the patient went for arterial studies that were really quite normal. ABI on the right at 1.19 with triphasic waveforms on the left 1.11 with triphasic waveforms. TBI 0.93 on the right and 0.95 on the left. This suggests T should be able to tolerate even 4 layer compression. He is however complaining of a lot of pain with our wraps I'm wondering whether the pain is simply  Iodoflex. I changed him to [employer] I'm still going to try to keep him in 3 layer compression 04/12/18 on evaluation today patient actually appears to be doing rather well in regard to his right lower [name] ulcer. He is making good progress although it is slow still I do believe that the Santyl is much better than the Iodoflex. The good news is the patient seems to be tolerating the dressing changes without complication now that we've gotten good of the Iodoflex which really did burn him quite significantly. Otherwise there's no evidence of infection. 04/17/18 on evaluation today patient actually appears to be showing signs of improvement in regard to the ulcer. Unfortunately he's had somewhat of a rough day due to the fact that he found out that one of his friends suddenly and unexpectedly passed today. He states he's not sure he's in the frame of mind to allow me to debride the  wound at this point. Nonetheless I do feel like he is showing signs of the wound getting better little by little each week. 04/24/18 on evaluation today patient's ulcer actually appears to be showing some signs of improvement albeit slow. He has been tolerating the dressing changes without complication except for the wrap which she states was so tight that he had to remove it it was causing a lot of discomfort. Fortunately there is no evidence of infection at this point. He states he only wore the wrap until about the time he got home last week and that he had to take it off. Nonetheless he does not have any other compression stockings, Juxta-Lite wraps, or anything otherwise to really help at this point as far as compression is concerned. 05/01/18 on evaluation today patient actually appears to be doing fairly well in regard to his lower extremity ulcer. He has been tolerating the dressing changes currently without complication. The wrap does seem to be helping as far as the fluid management is concerned. Wound bed itself actually show signs of good improvement although there is some Slough noted there's not as much as previous and he actually has fairly decent granulation noted as well. Overall I'm pleased with the progress of to this point. The patient would prefer not to have any debridement today he states he's actually not feeling too well in general and he really does not want any additional discomfort typically debridement is fairly uncomfortable for him. 05/08/18 on evaluation today patient actually appears to be doing a little better in regard to his lower extremity ulcer. He has been tolerating the dressing changes without complication. With that being said I'm actually quite pleased with the fact the wound is not appear to be infected and in general has made a little progress. With that being said I do believe we need to perform some debridement to clean away the necrotic tissue on the surface  of the wound. 05/15/18 on evaluation today patient actually appears to be doing a little better in regard to his wound. Fortunately there is some improvement noted fortunately there's also no significant evidence of infection at this point in time. He does have continued pain that really nothing different than previous he did get his Juxta-Lite wrap. 05/29/18 on evaluation today patient actually appears to be doing somewhat better in regard to his wound currently. He's been tolerating the dressing changes without complication. With that being said he has been performing the [facility] Dressing changes every day at home. I'm pretty sure that we told him  every other day last week or when I saw him rather two weeks ago. With that being said obviously did not hurt to do it daily just that obviously is gonna run out of supplies sooner and that could get him into trouble as far as his insurance is concerned. Nonetheless he at this point still continues to have pain although honestly I don't feel like it's as bad as what it was in the past just based on his reactions at this point. 06/12/18 on evaluation today patient actually appears to be doing better in regard to his lower [name] ulcer. He's been tolerating the dressing changes without complication. Fortunately there does not appear to be any evidence of infection at this time. No fevers, chills, nausea, or vomiting noted at this time. 06/19/18 evaluation today patient's ulcer on the lower extremity appears to be doing better at this point. he has been tolerating the dressing changes. The patient seems to be somewhat depressed about the progress of his wound although the overall appearance at this time seems to be doing well. In general I'm very pleased with the appearance today he does have some biofilm on the surface of the wound as well as of hyper granular tissue we may want to utilize a little bit of silver nitrate today. 06/27/2018; patient comes  into clinic today for a wound on the right lateral calf likely related to chronic venous insufficiency. He has been using medihoney covered with Hydrofera Blue. Wound surface actually looks quite good 07/03/2018 seen today for follow-up and management of lower extremity ulcer right lateral shin. T olerating dressing changes. He has a small amount of biofilm today. Will attempt to remove a portion of the biofilm as he tolerates. He becomes very anxious with wound treatments. He would benefit from layer compression wraps however he refuses compression wraps or anything that smokes his legs. He expressed an inability to tolerate compression wraps. Juxta lite wraps have been ordered. He has been instructed on the appropriate application of the juxta leg wraps. His blood pressure today on visit was 200/120. He denies any blurred vision, chest pain, dizziness, shortness of breath, or difficulty with mobility. Mr. Loistine SimasGladney stated that he had a recent visit with his primary care provider. At that time his carvedilol was decreased from 25 mg daily to 12.5 mg daily. Strongly encouraged Mr. Loistine SimasGladney to seek medical attention today during visit. He declined transport for evaluation of elevated blood pressure. Encouraged him to contact his primary care provider today for medication management. He stated that he would call his primary care doctor after wound visit today. Also encouraged him that if he experienced any symptoms of blurred vision, chest pain, shortness of breath to immediately seek medical attention. 07/17/18 on evaluation today patient's wound actually does seem to show signs of improvement based on the overall appearance of the wound bed today. There does not appear to show any signs of infection which is good news. There is no overall worsening which is also good news. He still has hyper granular tissue will use in the [employer] [facility] this point. 07/31/18 on evaluation today  patient's wound bed actually show signs of improvement with good epithelialization especially in the upper portion of the wound. [name], Prentis E ([id number]) [id number] Page 8 of 13 He has been tolerating the dressing changes without complication in general. Overall I'm extremely happy with how things stand. No fevers, chills, nausea, or vomiting noted at this time. 08/28/18 on evaluation today  patient appears to be doing a little better in regard to his lower extremity ulcer. With that being said it appears to be very hyper granular I think this is directly attributed to the fact that he continues to use the [employer] underneath the [facility] Dressing. Although this helps not to stick unfortunately also think he's not getting the benefit of the [facility] Dressing particular and subsequently is causing too much moisture buildup hence the hyper granulation. Fortunately there does not appear to be any signs of infection at this time which is good news. 09/03/18 on evaluation today patient appears to be doing well in regard to his lower [name] ulcer. He has been tolerating the dressing changes without complication. Fortunately he has much less hyper granular tissue the noted last week I do think using silver nitrate in changing to using the [facility] Dressing without the mepitel has made a big difference for him this is good news. 09/18/18 on evaluation today patient appears to be doing excellent in regard to his right lower [name] ulcer. This is actually significantly smaller even compared to the last evaluation. Overall I'm very pleased in this regard. I'm a recommend that we likely repeat the silver nitrate today based on what I'm seeing. 10/02/18 on evaluation today patient appears to be doing excellent in regard to his lower extremity wound on the right. He's been tolerating the dressing changes without complication. Fortunately there's no signs of infection  at this time. Overall been very pleased with how things seem to be progressing. No fevers, chills, nausea, or vomiting noted at this time. 10/16/18 on evaluation today patient actually appears to be doing much better in regard to his lower extremity wound on the right. This is shown signs of good improvement and is indeed measuring smaller he is on a excellent track as far as healing is concerned. My hope is this will be healed of the next several weeks. Fortunately there is no evidence of infection at this time. He does seem to be doing everything that I'm recommending for him 10/30/18 on evaluation today patient appears to be doing better in regard to his lower extremity ulcer. He's been tolerating the dressing changes without complication. Fortunately the Hydrofera Blue Dressing to be doing the job. He has made excellent progress. With that being said this is still going somewhat slow but nonetheless is always making good progress at this point. 5/18-Patient was in to be seen for right leg ulcer that appeared after scab above it was removed today. This area had healed completely. It appears that no further action is required on this READMISSION 01/02/2022 This is a now 67 year old male with poorly controlled type 2 diabetes mellitus and chronic venous insufficiency who once again was performing lawn care when a rock was flung up by his weedeater and it struck him in the right lateral lower leg. The wound has not healed though it has been nearly 2 months since the injury occurred. He was seen in the emergency department at [facility] on May 9. At that time it was very painful and he described it as a "15 on a scale of 0oo10". He also was found to have 3+ pitting edema to the bilateral lower extremities. He was prescribed a weeks course of Keflex. He is here today because the wound has failed to heal and he has a prior history in our clinic. ABI in clinic today was noncompressible. He did have formal  vascular studies performed in 2019 which  were normal. The last hemoglobin A1c I have available for review was from March 21, 2021 and was elevated at 7.7%. The wound is fairly small and circular located about 3 inches above his right lateral malleolus. It is completely covered with eschar. No surrounding erythema, no odor, no purulent drainage. 01/11/2022: The wound on his right lateral leg is a little bit smaller today. It has reaccumulated some slough and a small bit of eschar. It remains fairly painful. He has another area on his anterior tibia that he will not let me touch but I am concerned that there is a wound forming underneath the surface. 01/18/2022: Unfortunately, his wound is a little bit bigger today. He continues to accumulate slough on the wound surface. It is still quite tender. 01/25/2022: The patient bumped his wound on the car door which resulted in bleeding. He was seen at urgent care where it was redressed. Unfortunately, the wound is bigger again today. There is accumulated slough on the surface. Urgent care gave him some tramadol, apparently and he took this prior to his visit so he could tolerate debridement better. 02/02/2022: The wound is a little bit smaller today. The surface, however, has accumulated a fairly thick layer of slough. The periwound skin is intact and there is no obvious sign of infection. 02/07/2022: The wound is a bit larger today. It has thick slough accumulation. The periwound skin is intact and there is no obvious erythema, induration, nor any odor. 02/16/2022: I took a culture last week due to the appearance of the wound and the patient's degree of pain. This grew out methicillin sensitive Staph aureus. Augmentin was prescribed. The patient states that the pain is markedly improved today. The wound also looks much better. It is smaller with less slough buildup and there is good granulation tissue emerging. 02/21/2022: The patient's pain is quite improved from  prior visits. The wound is about the same size and has some accumulated slough on the surface. The base is a little bit fibrotic but there are some areas of granulation tissue filling in. 02/28/2022: The wound is a little bit narrower on its measurements but slightly longer. There is still some slough accumulation on the surface, but he has minimal pain and there is no concern for ongoing infection. Granulation tissue is emerging. 03/07/2022: During the week, he cut holes in his wrap because it was painful and too tight. As a result, his leg was quite a bit more swollen today and his wound was larger. The wound surface is fairly clean but a little bit dry. Minimal slough accumulation. Granulation tissue present. 03/14/2022: Once again, he did some arts and crafts projects with his wrap. On the other hand, however, his wound does look better. It is smaller today and is beginning to fill in. Minimal slough and a small amount of eschar accumulation. 03/27/2022: His wound measured bigger today, although it does not appear to be so on my impression. The wound continues to fill with granulation tissue. There is a little bit of slough and eschar accumulation. He continues to cut his wrap off of his feet and his edema control at the ankle is particularly poor with 3+ pitting edema. His insurance will not cover any sort of skin substitute unfortunately. 04/04/2022: The wound is smaller today. It is more superficial with good granulation tissue and just a little bit of slough. He continues to cut away portions of his wrap which results in inadequate edema control. 04/11/2022: His wound measured a couple  millimeters larger today. It continues to fill with granulation tissue and there is minimal slough on the surface. 04/19/2022: No significant change in the wound dimensions, but the surface is healthier and less fibrotic. 04/26/2022: The wound is a little bit bigger again today. There is slough accumulation on the  surface. Edema control is inadequate. He has been cutting the foot portion of his wrap off each week. 05/03/2022: The wound is a little smaller this week. There is still a fair amount of slough on the surface. His drainage was a little bit as greenish today, although not the blue-green typically associated with Pseudomonas. No significant odor. The underlying granulation tissue appears healthy. The culture that I took last week grew out methicillin sensitive Staph aureus. KENTRAVIOUS, LIPFORD ([id number]) [id number] Page 9 of 13 05/10/2022: The wound is again a little bit smaller this week. He still has slough accumulation on the surface. Most of the surface has fairly decent granulation tissue, but there is still a fibrotic portion in the center near the caudal aspect of the wound. He did not pick up the Augmentin that I prescribed for his MSSA infection. He says he will get it today. 05/19/2022: His wound is smaller by half a centimeter today. There is a little bit of slough buildup, but less so than at previous visits. He has not been taking his Augmentin properly. He has been splitting the tablets in half and taking them on an irregular schedule. He says this is because it has been upsetting his stomach. 05/25/2022: His wound is a little bit smaller again this week. Very minimal slough accumulation. The wound is drier than I would like to see. He completed his course of Augmentin. 10/19; patient has a wound on the right lateral lower leg in the setting of obvious chronic venous insufficiency. He has had a previous wound on the anterior leg that we dealt with several years ago. He has been using Hydrofera Blue. Kerlix and Coban 06/08/2022: The wound is smaller again this week. There is some light slough accumulation. It remains a little dry. 06/16/2022: Although the wound dimensions were the same this week, on visual inspection it appears smaller. It is clean, but still dry  despite the use of hydrogel. 06/22/2022: His wound responded very well to endoform. It is smaller, moisture balance is excellent, and the surface has lovely granulation tissue. Patient History Information obtained from Patient. Family History Cancer - Mother, Diabetes - Mother,Father, Hypertension - Mother,Father, Kidney Disease - Siblings, Stroke - Father, No family history of Heart Disease, Hereditary Spherocytosis, Lung Disease, Seizures, Thyroid Problems, Tuberculosis. Social History Former smoker - ended on 08/14/1990, Marital Status - Married, Alcohol Use - Moderate, Drug Use - No History, Caffeine Use - Daily. Medical History Eyes Denies history of Cataracts, Glaucoma, Optic Neuritis Ear/Nose/Mouth/Throat Denies history of Chronic sinus problems/congestion, Middle ear problems Hematologic/Lymphatic Denies history of Anemia, Hemophilia, Human Immunodeficiency Virus, Lymphedema, Sickle Cell Disease Respiratory Denies history of Aspiration, Asthma, Chronic Obstructive Pulmonary Disease (COPD), Pneumothorax, Sleep Apnea, Tuberculosis Cardiovascular Patient has history of Hypertension Denies history of Angina, Arrhythmia, Congestive Heart Failure, Coronary Artery Disease, Deep Vein Thrombosis, Hypotension, Myocardial Infarction, Peripheral Arterial Disease, Peripheral Venous Disease, Phlebitis, Vasculitis Gastrointestinal Denies history of Cirrhosis , Colitis, Crohnoos, Hepatitis A, Hepatitis B, Hepatitis C Endocrine Patient has history of Type II Diabetes Genitourinary Denies history of End Stage Renal Disease Immunological Denies history of Lupus Erythematosus, Raynaudoos, Scleroderma Integumentary (Skin) Denies history of History of Burn Musculoskeletal Denies history of Gout,  Rheumatoid Arthritis, Osteoarthritis, Osteomyelitis Neurologic Denies history of Dementia, Neuropathy, Quadriplegia, Paraplegia, Seizure Disorder Oncologic Denies history of Received Chemotherapy,  Received Radiation Psychiatric Denies history of Anorexia/bulimia, Confinement Anxiety Hospitalization/Surgery History - esophagogastroduodenoscopy. - brain aneurysm surgery. Medical A Surgical History Notes nd Constitutional Symptoms (General Health) obesity Gastrointestinal diverticulitis Neurologic neuropathy Objective Constitutional Hypertensive, asymptomatic. He did not take his blood pressure medicine today.. No acute distress.. Vitals Time Taken: 9:31 AM, Height: 75 in, Weight: 310 lbs, BMI: 38.7, Temperature: 98.4 F, Pulse: 76 bpm, Respiratory Rate: 16 breaths/min, Blood Pressure: 185/100 mmHg. LORNE, WINKELS ([id number]) [id number] Page 10 of 13 General Notes: Bp is elevated; pt stated he did not take BP meds this morning Respiratory Normal work of breathing on room air.. General Notes: 06/22/2022: His wound responded very well to endoform. It is smaller, moisture balance is excellent, and the surface has lovely granulation tissue. Integumentary (Hair, Skin) Wound #2 status is Open. Original cause of wound was Trauma. The date acquired was: 12/03/2021. The wound has been in treatment 24 weeks. The wound is located on the Right,Lateral Lower Leg. The wound measures 3.1cm length x 0.8cm width x 0.1cm depth; 1.948cm^2 area and 0.195cm^3 volume. There is Fat Layer (Subcutaneous Tissue) exposed. There is no tunneling or undermining noted. There is a medium amount of serosanguineous drainage noted. The wound margin is distinct with the outline attached to the wound base. There is medium (34-66%) red granulation within the wound bed. There is a medium (34-66%) amount of necrotic tissue within the wound bed including Adherent Slough. The periwound skin appearance had no abnormalities noted for texture. The periwound skin appearance had no abnormalities noted for color. The periwound skin appearance exhibited: Dry/Scaly. Periwound temperature was noted as  No Abnormality. Assessment Active Problems ICD-10 Non-pressure chronic ulcer of other part of right lower leg with fat layer exposed Type 2 diabetes mellitus with other skin ulcer Type 2 diabetes mellitus without complications Essential (primary) hypertension Venous insufficiency (chronic) (peripheral) Procedures Wound #2 Pre-procedure diagnosis of Wound #2 is a Venous Leg Ulcer located on the Right,Lateral Lower Leg .Severity of Tissue Pre Debridement is: Fat layer exposed. There was a Selective/Open Wound Non-Viable Tissue Debridement with a total area of 2.48 sq cm performed by Duanne Guess, MD. With the following instrument(s): Curette to remove Non-Viable tissue/material. Material removed includes Eschar and Slough and after achieving pain control using Lidocaine 5% topical ointment. No specimens were taken. A time out was conducted at 09:40, prior to the start of the procedure. A Minimum amount of bleeding was controlled with Pressure. The procedure was tolerated well. Post Debridement Measurements: 3.1cm length x 0.8cm width x 0.1cm depth; 0.195cm^3 volume. Character of Wound/Ulcer Post Debridement requires further debridement. Severity of Tissue Post Debridement is: Fat layer exposed. Post procedure Diagnosis Wound #2: Same as Pre-Procedure General Notes: Scribed for Dr. Lady Gary by Tommie Ard, RN. Pre-procedure diagnosis of Wound #2 is a Venous Leg Ulcer located on the Right,Lateral Lower Leg . There was a Three Layer Compression Therapy Procedure by Tommie Ard, RN. Post procedure Diagnosis Wound #2: Same as Pre-Procedure Plan Follow-up Appointments: Return Appointment in 1 week. - Dr. Lady Gary - room 2 Anesthetic: Wound #2 Right,Lateral Lower Leg: (In clinic) Topical Lidocaine 5% applied to wound bed Bathing/ Shower/ Hygiene: May shower with protection but do not get wound dressing(s) wet. - may purchase a cast protector from [facility] or [facility] Edema Control - Lymphedema /  SCD / Other: Elevate legs to the level of the heart  or above for 30 minutes daily and/or when sitting, a frequency of: Avoid standing for long periods of time. Patient to wear own compression stockings every day. Exercise regularly Moisturize legs daily. Compression stocking or Garment 20-30 mm/Hg pressure to: - to L left until R leg is healed WOUND #2: - Lower Leg Wound Laterality: Right, Lateral Cleanser: Soap and Water 1 x Per Week/30 Days Discharge Instructions: May shower and wash wound with dial antibacterial soap and water prior to dressing change. Cleanser: Wound Cleanser 1 x Per Week/30 Days Discharge Instructions: Cleanse the wound with wound cleanser prior to applying a clean dressing using gauze sponges, not tissue or cotton balls. Peri-Wound Care: Triamcinolone 15 (g) 1 x Per Week/30 Days Discharge Instructions: Use triamcinolone 15 (g) as directed Peri-Wound Care: Sween Lotion (Moisturizing lotion) 1 x Per Week/30 Days Discharge Instructions: Apply moisturizing lotion as directed Prim Dressing: Endoform 2x2 in 1 x Per Week/30 Days ary Discharge Instructions: Moisten with saline Secondary Dressing: Woven Gauze Sponge, Non-Sterile 4x4 in 1 x Per Week/30 Days Discharge Instructions: Apply over primary dressing as directed. LOCHLIN, EPPINGER ([id number]) [id number] Page 11 of 13 Secured With: 87M Medipore H Soft Cloth Surgical T ape, 4 x 10 (in/yd) 1 x Per Week/30 Days Discharge Instructions: Secure with tape as directed. Compression Wrap: ThreePress (3 layer compression wrap) 1 x Per Week/30 Days Discharge Instructions: Apply three layer compression as directed. 06/22/2022: His wound responded very well to endoform. It is smaller, moisture balance is excellent, and the surface has lovely granulation tissue. I used a curette to debride some eschar and a little slough from the wound. We will continue using endoform and 3 layer compression. Follow-up in 1  week. Electronic Signature(s) Signed: 06/22/2022 9:55:38 AM By: Duanne Guess MD FACS Entered By: Duanne Guess on 06/22/2022 09:55:37 -------------------------------------------------------------------------------- HxROS Details Patient Name: Date of Service: Anthony Barnes, Anthony [name] 06/22/2022 9:15 A M Medical Record Number: [id number] Patient Account Number: [id number] Date of Birth/Sex: Treating RN: 08-09-1956 (66 y.o. M) Primary Care Provider: Hoy Register Other Clinician: Referring Provider: Treating Provider/Extender: Camillo Flaming Weeks in Treatment: 24 Information Obtained From Patient Constitutional Symptoms (General Health) Medical History: Past Medical History Notes: obesity Eyes Medical History: Negative for: Cataracts; Glaucoma; Optic Neuritis Ear/Nose/Mouth/Throat Medical History: Negative for: Chronic sinus problems/congestion; Middle ear problems Hematologic/Lymphatic Medical History: Negative for: Anemia; Hemophilia; Human Immunodeficiency Virus; Lymphedema; Sickle Cell Disease Respiratory Medical History: Negative for: Aspiration; Asthma; Chronic Obstructive Pulmonary Disease (COPD); Pneumothorax; Sleep Apnea; Tuberculosis Cardiovascular Medical History: Positive for: Hypertension Negative for: Angina; Arrhythmia; Congestive Heart Failure; Coronary Artery Disease; Deep Vein Thrombosis; Hypotension; Myocardial Infarction; Peripheral Arterial Disease; Peripheral Venous Disease; Phlebitis; Vasculitis Gastrointestinal Medical History: Negative for: Cirrhosis ; Colitis; Crohns; Hepatitis A; Hepatitis B; Hepatitis C Past Medical History Notes: diverticulitis DESTRY, DAUBER ([id number]) [id number] Page 12 of 13 Endocrine Medical History: Positive for: Type II Diabetes Time with diabetes: 8 years Treated with: Oral agents Blood sugar tested every day: No Genitourinary Medical History: Negative for:  End Stage Renal Disease Immunological Medical History: Negative for: Lupus Erythematosus; Raynauds; Scleroderma Integumentary (Skin) Medical History: Negative for: History of Burn Musculoskeletal Medical History: Negative for: Gout; Rheumatoid Arthritis; Osteoarthritis; Osteomyelitis Neurologic Medical History: Negative for: Dementia; Neuropathy; Quadriplegia; Paraplegia; Seizure Disorder Past Medical History Notes: neuropathy Oncologic Medical History: Negative for: Received Chemotherapy; Received Radiation Psychiatric Medical History: Negative for: Anorexia/bulimia; Confinement Anxiety Immunizations Pneumococcal Vaccine: Received Pneumococcal Vaccination: No Immunization Notes: tetanus 2 years ago Implantable Devices None Hospitalization /  Surgery History Type of Hospitalization/Surgery esophagogastroduodenoscopy brain aneurysm surgery Family and Social History Cancer: Yes - Mother; Diabetes: Yes - Mother,Father; Heart Disease: No; Hereditary Spherocytosis: No; Hypertension: Yes - Mother,Father; Kidney Disease: Yes - Siblings; Lung Disease: No; Seizures: No; Stroke: Yes - Father; Thyroid Problems: No; Tuberculosis: No; Former smoker - ended on 08/14/1990; Marital Status - Married; Alcohol Use: Moderate; Drug Use: No History; Caffeine Use: Daily; Financial Concerns: No; Food, Clothing or Shelter Needs: No; Support System Lacking: No; Transportation Concerns: No Electronic Signature(s) Signed: 06/22/2022 10:06:11 AM By: Duanne Guess MD FACS Entered By: Duanne Guess on 06/22/2022 09:53:18 Arnetha Massy ([id number]) [id number] Page 13 of 13 -------------------------------------------------------------------------------- SuperBill Details Patient Name: Date of Service: [name] 06/22/2022 Medical Record Number: [id number] Patient Account Number: [id number] Date of Birth/Sex: Treating RN: 1956-01-18 (66 y.o. M) Primary Care  Provider: Hoy Register Other Clinician: Referring Provider: Treating Provider/Extender: Camillo Flaming Weeks in Treatment: 24 Diagnosis Coding ICD-10 Codes Code Description [phone] Non-pressure chronic ulcer of other part of right lower leg with fat layer exposed E11.622 Type 2 diabetes mellitus with other skin ulcer E11.9 Type 2 diabetes mellitus without complications I10 Essential (primary) hypertension I87.2 Venous insufficiency (chronic) (peripheral) Facility Procedures : CPT4 Code: [id number] Description: 84696 - DEBRIDE WOUND 1ST 20 SQ CM OR < ICD-10 Diagnosis Description 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os Modifier: ed Quantity: 1 Physician Procedures : CPT4 Code Description Modifier [id number] - WC PHYS LEVEL 3 - EST PT 25 ICD-10 Diagnosis Description 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osed E11.622 Type 2 diabetes mellitus with other skin ulcer  I87.2 Venous insufficiency (chronic) (peripheral) I10 Essential (primary) hypertension Quantity: 1 : [id number] - WC PHYS DEBR WO ANESTH 20 SQ CM ICD-10 Diagnosis Description 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osed Quantity: 1 Electronic Signature(s) Signed: 06/22/2022 9:55:54 AM By: Duanne Guess MD FACS Entered By: Duanne Guess on 06/22/2022 09:55:54

## 2022-06-29 ENCOUNTER — Encounter (HOSPITAL_BASED_OUTPATIENT_CLINIC_OR_DEPARTMENT_OTHER): Payer: Medicare Other | Admitting: General Surgery

## 2022-06-29 DIAGNOSIS — E119 Type 2 diabetes mellitus without complications: Secondary | ICD-10-CM | POA: Diagnosis not present

## 2022-06-29 DIAGNOSIS — I872 Venous insufficiency (chronic) (peripheral): Secondary | ICD-10-CM | POA: Diagnosis not present

## 2022-06-29 DIAGNOSIS — E11622 Type 2 diabetes mellitus with other skin ulcer: Secondary | ICD-10-CM | POA: Diagnosis not present

## 2022-06-29 DIAGNOSIS — L97812 Non-pressure chronic ulcer of other part of right lower leg with fat layer exposed: Secondary | ICD-10-CM | POA: Diagnosis not present

## 2022-06-29 DIAGNOSIS — I10 Essential (primary) hypertension: Secondary | ICD-10-CM | POA: Diagnosis not present

## 2022-06-30 NOTE — Progress Notes (Signed)
AJENE, CARCHI ([id number]) [id number] Page 1 of 8 Visit Report for 06/29/2022 Arrival Information Details Patient Name: Date of Service: Anthony Barnes [name] 06/29/2022 9:15 A M Medical Record Number: [id number] Patient Account Number: [id number] Date of Birth/Sex: Treating RN: 03/19/1956 (66 y.o. Waldron Session Primary Care Primitivo Merkey: Charlott Rakes Other Clinician: Referring Bronwen Pendergraft: Treating Dang Mathison/Extender: Carter Kitten Weeks in Treatment: 25 Visit Information History Since Last Visit Added or deleted any medications: No Patient Arrived: Ambulatory Any new allergies or adverse reactions: No Arrival Time: 09:18 Had a fall or experienced change in No Accompanied By: self activities of daily living that may affect Transfer Assistance: None risk of falls: Patient Requires Transmission-Based Precautions: No Signs or symptoms of abuse/neglect since last visito No Patient Has Alerts: No Hospitalized since last visit: No Implantable device outside of the clinic excluding No cellular tissue based products placed in the center since last visit: Has Compression in Place as Prescribed: Yes Pain Present Now: No Electronic Signature(s) Signed: 06/29/2022 4:56:15 PM By: Blanche East RN Entered By: Blanche East on 06/29/2022 09:18:51 -------------------------------------------------------------------------------- Compression Therapy Details Patient Name: Date of Service: Anthony Barnes, [name] 06/29/2022 9:15 A M Medical Record Number: [id number] Patient Account Number: [id number] Date of Birth/Sex: Treating RN: 09/16/1955 (66 y.o. Waldron Session Primary Care Lataria Courser: Charlott Rakes Other Clinician: Referring Darryl Willner: Treating Ahnya Akre/Extender: Carter Kitten Weeks in Treatment: 25 Compression Therapy Performed for Wound Assessment: Wound #2 Right,Lateral Lower Leg Performed By: Clinician Blanche East,  RN Compression Type: Three Layer Post Procedure Diagnosis Same as Pre-procedure Electronic Signature(s) Signed: 06/29/2022 4:56:15 PM By: Blanche East RN Entered By: Blanche East on 06/29/2022 09:59:49 [name] ([id number]) [id number] Page 2 of 8 -------------------------------------------------------------------------------- Encounter Discharge Information Details Patient Name: Date of Service: Anthony Barnes [name] 06/29/2022 9:15 A M Medical Record Number: [id number] Patient Account Number: [id number] Date of Birth/Sex: Treating RN: 04-Oct-1955 (66 y.o. Waldron Session Primary Care [name]: Charlott Rakes Other Clinician: Referring Mattilyn Crites: Treating [name]/Extender: Carter Kitten Weeks in Treatment: 25 Encounter Discharge Information Items Discharge Condition: Stable Ambulatory Status: Ambulatory Discharge Destination: Home Transportation: Private Auto Accompanied By: self Schedule Follow-up Appointment: Yes Clinical Summary of Care: Electronic Signature(s) Signed: 06/29/2022 4:56:15 PM By: Blanche East RN Entered By: Blanche East on 06/29/2022 09:40:16 -------------------------------------------------------------------------------- Lower Extremity Assessment Details Patient Name: Date of Service: Anthony Barnes [name] 06/29/2022 9:15 A M Medical Record Number: [id number] Patient Account Number: [id number] Date of Birth/Sex: Treating RN: 1956/04/10 (66 y.o. Waldron Session Primary Care [name]: Charlott Rakes Other Clinician: Referring Calil Amor: Treating Keigan Tafoya/Extender: Carter Kitten Weeks in Treatment: 25 Edema Assessment Assessed: [Left: No] [Right: No] [Left: Edema] [Right: :] Calf Left: Right: Point of Measurement: From Medial Instep 39 cm Ankle Left: Right: Point of Measurement: From Medial Instep 21 cm Vascular Assessment Pulses: Dorsalis Pedis Palpable:  [Right:Yes] Electronic Signature(s) Signed: 06/29/2022 4:56:15 PM By: Blanche East RN Entered By: Blanche East on 06/29/2022 09:24:43 Multi Wound Chart Details -------------------------------------------------------------------------------- [name] ([id number]) [id number] Page 3 of 8 Patient Name: Date of Service: Anthony Barnes [name] 06/29/2022 9:15 A M Medical Record Number: [id number] Patient Account Number: [id number] Date of Birth/Sex: Treating RN: 06/30/1956 (66 y.o. M) Primary Care [name]: Charlott Rakes Other Clinician: Referring Jeylin Woodmansee: Treating Braya Habermehl/Extender: Carter Kitten Weeks in Treatment: 25 Vital Signs Height(in): 75 Pulse(bpm): 75 Weight(lbs): 310 Blood Pressure(mmHg): 159/97 Body Mass Index(BMI): 38.7 Temperature(F): 97.9 Respiratory Rate(breaths/min): 16 Wound Assessments Wound  Number: 2 N/A N/A Photos: N/A N/A Right, Lateral Lower Leg N/A N/A Wound Location: Trauma N/A N/A Wounding Event: Venous Leg Ulcer N/A N/A Primary Etiology: Hypertension, Type II Diabetes N/A N/A Comorbid History: 12/03/2021 N/A N/A Date Acquired: 25 N/A N/A Weeks of Treatment: Open N/A N/A Wound Status: No N/A N/A Wound Recurrence: 3.5x1x0.1 N/A N/A Measurements L x W x D (cm) 2.749 N/A N/A A (cm) : rea 0.275 N/A N/A Volume (cm) : -159.30% N/A N/A % Reduction in A rea: -159.40% N/A N/A % Reduction in Volume: Full Thickness Without Exposed N/A N/A Classification: Support Structures Medium N/A N/A Exudate Amount: Serosanguineous N/A N/A Exudate Type: red, brown N/A N/A Exudate Color: Distinct, outline attached N/A N/A Wound Margin: Medium (34-66%) N/A N/A Granulation Amount: Red N/A N/A Granulation Quality: Medium (34-66%) N/A N/A Necrotic Amount: Fat Layer (Subcutaneous Tissue): Yes N/A N/A Exposed Structures: Fascia: No Tendon: No Muscle: No Joint: No Bone: No Medium (34-66%)  N/A N/A Epithelialization: No Abnormalities Noted N/A N/A Periwound Skin Texture: Dry/Scaly: Yes N/A N/A Periwound Skin Moisture: No Abnormalities Noted N/A N/A Periwound Skin Color: No Abnormality N/A N/A Temperature: Chemical Cauterization N/A N/A Procedures Performed: Treatment Notes Wound #2 (Lower Leg) Wound Laterality: Right, Lateral Cleanser Soap and Water Discharge Instruction: May shower and wash wound with dial antibacterial soap and water prior to dressing change. Wound Cleanser Discharge Instruction: Cleanse the wound with wound cleanser prior to applying a clean dressing using gauze sponges, not tissue or cotton balls. Peri-Wound Care Triamcinolone 15 (g) Discharge Instruction: Use triamcinolone 15 (g) as directed Sween Lotion (Moisturizing lotion) Discharge Instruction: Apply moisturizing lotion as directed Topical JAHIR, HALT ([id number]) [id number] Page 4 of 8 Primary Dressing Endoform 2x2 in Discharge Instruction: Moisten with saline Secondary Dressing Woven Gauze Sponge, Non-Sterile 4x4 in Discharge Instruction: Apply over primary dressing as directed. Secured With 14M Medipore H Soft Cloth Surgical T ape, 4 x 10 (in/yd) Discharge Instruction: Secure with tape as directed. Compression Wrap ThreePress (3 layer compression wrap) Discharge Instruction: Apply three layer compression as directed. Compression Stockings Add-Ons Electronic Signature(s) Signed: 06/29/2022 9:41:10 AM By: Fredirick Maudlin MD FACS Entered By: Fredirick Maudlin on 06/29/2022 09:41:10 -------------------------------------------------------------------------------- Multi-Disciplinary Care Plan Details Patient Name: Date of Service: Anthony Barnes, [name] 06/29/2022 9:15 A M Medical Record Number: [id number] Patient Account Number: [id number] Date of Birth/Sex: Treating RN: February 11, 1956 (66 y.o. Waldron Session Primary Care Abelardo Seidner: Charlott Rakes Other  Clinician: Referring Kripa Foskey: Treating Milley Vining/Extender: Carter Kitten Weeks in Treatment: 25 Active Inactive Venous Leg Ulcer Nursing Diagnoses: Actual venous Insuffiency (use after diagnosis is confirmed) Knowledge deficit related to disease process and management Goals: Patient will maintain optimal edema control Date Initiated: 01/02/2022 Target Resolution Date: 07/13/2022 Goal Status: Active Patient/caregiver will verbalize understanding of disease process and disease management Date Initiated: 01/02/2022 Date Inactivated: 03/07/2022 Target Resolution Date: 03/09/2022 Goal Status: Met Interventions: Assess peripheral edema status every visit. Compression as ordered Provide education on venous insufficiency Treatment Activities: Non-invasive vascular studies : 01/02/2022 T ordered outside of clinic : 01/02/2022 est Therapeutic compression applied : 01/02/2022 Notes: Wound/Skin Impairment Nursing Diagnoses: HARTFORD, MAULDEN ([id number]) [phone].pdf Page 5 of 8 Impaired tissue integrity Knowledge deficit related to ulceration/compromised skin integrity Goals: Patient/caregiver will verbalize understanding of skin care regimen Date Initiated: 01/02/2022 Date Inactivated: 02/02/2022 Target Resolution Date: 02/03/2022 Goal Status: Met Ulcer/skin breakdown will have a volume reduction of 30% by week 4 Date Initiated: 01/02/2022 Date Inactivated: 06/08/2022 Target Resolution Date: 06/09/2022 Goal Status:  Met Ulcer/skin breakdown will have a volume reduction of 50% by week 8 Date Initiated: 06/08/2022 Target Resolution Date: 07/13/2022 Goal Status: Active Interventions: Assess ulceration(s) every visit Provide education on ulcer and skin care Treatment Activities: Skin care regimen initiated : 01/02/2022 Topical wound management initiated : 01/02/2022 Notes: Electronic Signature(s) Signed: 06/29/2022 4:56:15 PM By: Blanche East  RN Entered By: Blanche East on 06/29/2022 09:31:00 -------------------------------------------------------------------------------- Pain Assessment Details Patient Name: Date of Service: Anthony Barnes [name] 06/29/2022 9:15 A M Medical Record Number: [id number] Patient Account Number: [id number] Date of Birth/Sex: Treating RN: 10-16-1955 (66 y.o. Waldron Session Primary Care Quavis Klutz: Charlott Rakes Other Clinician: Referring Lachell Rochette: Treating Sarye Kath/Extender: Carter Kitten Weeks in Treatment: 25 Active Problems Location of Pain Severity and Description of Pain Patient Has Paino No Site Locations Pain Management and Medication Current Pain Management: Electronic Signature(s) Signed: 06/29/2022 4:56:15 PM By: Blanche East RN [name] ([id number]) [id number] Page 6 of 8 Entered By: Blanche East on 06/29/2022 09:19:18 -------------------------------------------------------------------------------- Patient/Caregiver Education Details Patient Name: Date of Service: [name] 11/16/2023andnbsp9:15 A M Medical Record Number: [id number] Patient Account Number: [id number] Date of Birth/Gender: Treating RN: Mar 31, 1956 (66 y.o. Waldron Session Primary Care Physician: Charlott Rakes Other Clinician: Referring Physician: Treating Physician/Extender: Carter Kitten Weeks in Treatment: 25 Education Assessment Education Provided To: Patient Education Topics Provided Venous: Methods: Explain/Verbal Responses: Reinforcements needed, State content correctly Wound/Skin Impairment: Methods: Explain/Verbal Responses: Reinforcements needed, State content correctly Electronic Signature(s) Signed: 06/29/2022 4:56:15 PM By: Blanche East RN Entered By: Blanche East on 06/29/2022 09:33:55 -------------------------------------------------------------------------------- Wound Assessment Details Patient  Name: Date of Service: Anthony Barnes, [name] 06/29/2022 9:15 A M Medical Record Number: [id number] Patient Account Number: [id number] Date of Birth/Sex: Treating RN: 08-30-1955 (66 y.o. Waldron Session Primary Care Arvie Villarruel: Charlott Rakes Other Clinician: Referring Truett Mcfarlan: Treating Jannatul Wojdyla/Extender: Carter Kitten Weeks in Treatment: 25 Wound Status Wound Number: 2 Primary Etiology: Venous Leg Ulcer Wound Location: Right, Lateral Lower Leg Wound Status: Open Wounding Event: Trauma Comorbid History: Hypertension, Type II Diabetes Date Acquired: 12/03/2021 Weeks Of Treatment: 25 Clustered Wound: No Photos LAYDON, MARTIS ([id number]) [phone].pdf Page 7 of 8 Wound Measurements Length: (cm) 3.5 Width: (cm) 1 Depth: (cm) 0.1 Area: (cm) 2.749 Volume: (cm) 0.275 % Reduction in Area: -159.3% % Reduction in Volume: -159.4% Epithelialization: Medium (34-66%) Tunneling: No Undermining: No Wound Description Classification: Full Thickness Without Exposed Support Structures Wound Margin: Distinct, outline attached Exudate Amount: Medium Exudate Type: Serosanguineous Exudate Color: red, brown Foul Odor After Cleansing: No Slough/Fibrino Yes Wound Bed Granulation Amount: Medium (34-66%) Exposed Structure Granulation Quality: Red Fascia Exposed: No Necrotic Amount: Medium (34-66%) Fat Layer (Subcutaneous Tissue) Exposed: Yes Necrotic Quality: Adherent Slough Tendon Exposed: No Muscle Exposed: No Joint Exposed: No Bone Exposed: No Periwound Skin Texture Texture Color No Abnormalities Noted: Yes No Abnormalities Noted: Yes Moisture Temperature / Pain No Abnormalities Noted: No Temperature: No Abnormality Dry / Scaly: Yes Treatment Notes Wound #2 (Lower Leg) Wound Laterality: Right, Lateral Cleanser Soap and Water Discharge Instruction: May shower and wash wound with dial antibacterial soap and water prior to dressing  change. Wound Cleanser Discharge Instruction: Cleanse the wound with wound cleanser prior to applying a clean dressing using gauze sponges, not tissue or cotton balls. Peri-Wound Care Triamcinolone 15 (g) Discharge Instruction: Use triamcinolone 15 (g) as directed Sween Lotion (Moisturizing lotion) Discharge Instruction: Apply moisturizing lotion as directed Topical Primary Dressing Endoform 2x2 in Discharge Instruction: Moisten  with saline Secondary Dressing Woven Gauze Sponge, Non-Sterile 4x4 in Discharge Instruction: Apply over primary dressing as directed. Secured With 25M Medipore H Soft Cloth Surgical T ape, 4 x 10 (in/yd) Discharge Instruction: Secure with tape as directed. RAWAD, BOCHICCHIO ([id number]) [id number] Page 8 of 8 Compression Wrap ThreePress (3 layer compression wrap) Discharge Instruction: Apply three layer compression as directed. Compression Stockings Add-Ons Electronic Signature(s) Signed: 06/29/2022 4:56:15 PM By: Blanche East RN Entered By: Blanche East on 06/29/2022 09:32:52 -------------------------------------------------------------------------------- Vitals Details Patient Name: Date of Service: Anthony Barnes, [name] 06/29/2022 9:15 A M Medical Record Number: [id number] Patient Account Number: [id number] Date of Birth/Sex: Treating RN: 24-Mar-1956 (66 y.o. Waldron Session Primary Care Angee Gupton: Charlott Rakes Other Clinician: Referring Aubreigh Fuerte: Treating Rontrell Moquin/Extender: Carter Kitten Weeks in Treatment: 25 Vital Signs Time Taken: 09:18 Temperature (F): 97.9 Height (in): 75 Pulse (bpm): 75 Weight (lbs): 310 Respiratory Rate (breaths/min): 16 Body Mass Index (BMI): 38.7 Blood Pressure (mmHg): 159/97 Reference Range: 80 - 120 mg / dl Electronic Signature(s) Signed: 06/29/2022 4:56:15 PM By: Blanche East RN Entered By: Blanche East on 06/29/2022 09:19:08

## 2022-06-30 NOTE — Progress Notes (Signed)
TEONDRE, JAROSZ ([id number]) [id number] Page 1 of 13 Visit Report for 06/29/2022 Chief Complaint Document Details Patient Name: Date of Service: Anthony Barnes [name] 06/29/2022 9:15 A M Medical Record Number: [id number] Patient Account Number: [id number] Date of Birth/Sex: Treating RN: 11-03-1955 (66 y.o. M) Primary Care Provider: Hoy Register Other Clinician: Referring Provider: Treating Provider/Extender: Camillo Flaming Weeks in Treatment: 25 Information Obtained from: Patient Chief Complaint Right LE Ulcer Electronic Signature(s) Signed: 06/29/2022 9:41:17 AM By: Duanne Guess MD FACS Entered By: Duanne Guess on 06/29/2022 09:41:17 -------------------------------------------------------------------------------- HPI Details Patient Name: Date of Service: Anthony Barnes, Anthony [name] 06/29/2022 9:15 A M Medical Record Number: [id number] Patient Account Number: [id number] Date of Birth/Sex: Treating RN: 1956-06-03 (66 y.o. M) Primary Care Provider: Hoy Register Other Clinician: Referring Provider: Treating Provider/Extender: Camillo Flaming Weeks in Treatment: 25 History of Present Illness HPI Description: ADMISSION 03/12/18 This is a 66 year old and it was a type II diabetic reasonably poorly controlled with a recent hemoglobin A1c of 8.2. He was tending to his lawn on 01/15/18 when his weed [name] sent a rock up word hitting him in the right anterior leg. He was seen in his primary M.D. office is same time. An x-ray showed no abnormality. He was given a course of cephalexin. Since then he's been applying peroxide and topical antibiotics to the wound. He does not have a history of chronic wounds however he does have chronic edema in the lower legs. He is complaining of pain in the right leg wound but no real history of claudication. Past medical history includes type 2 diabetes, hypertension, morbid  obesity. ABIs in the right leg were noncompressible 03/19/18 on evaluation today patient appears to be tolerating the wrap in general fairly well. He states he's not having that much pain in regard to the wound itself. With that being said he is having some issues with slough buildup on the surface of the wound the Iodoflex does seem to be helpful in that regard. He is still having discomfort he wonders if I can send in a prescription for ibuprofen or something of like to help him out. I do believe that the prox end may be of benefit for him. 03/25/18 on evaluation today patient appears to be doing very well in regard to his right lateral lower [name]. He's been tolerating the dressing changes without complication that is the wraps. Nonetheless he does continue to have pain he states is been dreading the possibility of having to have debridement again today. His first debridement experience was not optimal. With that being said he does seem to be showing signs of improvement there does appear to be little bit more granulation he does have a lot of slough that really does need to breeding away. 04/04/18;the patient went for arterial studies that were really quite normal. ABI on the right at 1.19 with triphasic waveforms on the left 1.11 with triphasic waveforms. TBI 0.93 on the right and 0.95 on the left. This suggests T should be able to tolerate even 4 layer compression. He is however complaining of a lot of pain with our wraps I'm wondering whether the pain is simply Iodoflex. I changed him to [employer] I'm still going to try to keep him in 3 layer compression 04/12/18 on evaluation today patient actually appears to be doing rather well in regard to his right lower [name] ulcer. He is making good progress although it is slow still I  do believe that the Santyl is much better than the Iodoflex. The good news is the patient seems to be tolerating the dressing  changes without complication now that we've gotten good of the Iodoflex which really did burn him quite significantly. Otherwise there's no evidence of infection. 04/17/18 on evaluation today patient actually appears to be showing signs of improvement in regard to the ulcer. Unfortunately he's had somewhat of a rough Anthony, Barnes ([id number]) [id number] Page 2 of 13 day due to the fact that he found out that one of his friends suddenly and unexpectedly passed today. He states he's not sure he's in the frame of mind to allow me to debride the wound at this point. Nonetheless I do feel like he is showing signs of the wound getting better little by little each week. 04/24/18 on evaluation today patient's ulcer actually appears to be showing some signs of improvement albeit slow. He has been tolerating the dressing changes without complication except for the wrap which she states was so tight that he had to remove it it was causing a lot of discomfort. Fortunately there is no evidence of infection at this point. He states he only wore the wrap until about the time he got home last week and that he had to take it off. Nonetheless he does not have any other compression stockings, Juxta-Lite wraps, or anything otherwise to really help at this point as far as compression is concerned. 05/01/18 on evaluation today patient actually appears to be doing fairly well in regard to his lower extremity ulcer. He has been tolerating the dressing changes currently without complication. The wrap does seem to be helping as far as the fluid management is concerned. Wound bed itself actually show signs of good improvement although there is some Slough noted there's not as much as previous and he actually has fairly decent granulation noted as well. Overall I'm pleased with the progress of to this point. The patient would prefer not to have any debridement today he states he's actually not feeling  too well in general and he really does not want any additional discomfort typically debridement is fairly uncomfortable for him. 05/08/18 on evaluation today patient actually appears to be doing a little better in regard to his lower extremity ulcer. He has been tolerating the dressing changes without complication. With that being said I'm actually quite pleased with the fact the wound is not appear to be infected and in general has made a little progress. With that being said I do believe we need to perform some debridement to clean away the necrotic tissue on the surface of the wound. 05/15/18 on evaluation today patient actually appears to be doing a little better in regard to his wound. Fortunately there is some improvement noted fortunately there's also no significant evidence of infection at this point in time. He does have continued pain that really nothing different than previous he did get his Juxta-Lite wrap. 05/29/18 on evaluation today patient actually appears to be doing somewhat better in regard to his wound currently. He's been tolerating the dressing changes without complication. With that being said he has been performing the [facility] Dressing changes every day at home. I'm pretty sure that we told him every other day last week or when I saw him rather two weeks ago. With that being said obviously did not hurt to do it daily just that obviously is gonna run out of supplies sooner and that could get him into trouble  as far as his insurance is concerned. Nonetheless he at this point still continues to have pain although honestly I don't feel like it's as bad as what it was in the past just based on his reactions at this point. 06/12/18 on evaluation today patient actually appears to be doing better in regard to his lower [name] ulcer. He's been tolerating the dressing changes without complication. Fortunately there does not appear to be any evidence of infection at this time. No  fevers, chills, nausea, or vomiting noted at this time. 06/19/18 evaluation today patient's ulcer on the lower extremity appears to be doing better at this point. he has been tolerating the dressing changes. The patient seems to be somewhat depressed about the progress of his wound although the overall appearance at this time seems to be doing well. In general I'm very pleased with the appearance today he does have some biofilm on the surface of the wound as well as of hyper granular tissue we may want to utilize a little bit of silver nitrate today. 06/27/2018; patient comes into clinic today for a wound on the right lateral calf likely related to chronic venous insufficiency. He has been using medihoney covered with Hydrofera Blue. Wound surface actually looks quite good 07/03/2018 seen today for follow-up and management of lower extremity ulcer right lateral shin. T olerating dressing changes. He has a small amount of biofilm today. Will attempt to remove a portion of the biofilm as he tolerates. He becomes very anxious with wound treatments. He would benefit from layer compression wraps however he refuses compression wraps or anything that smokes his legs. He expressed an inability to tolerate compression wraps. Juxta lite wraps have been ordered. He has been instructed on the appropriate application of the juxta leg wraps. His blood pressure today on visit was 200/120. He denies any blurred vision, chest pain, dizziness, shortness of breath, or difficulty with mobility. Mr. Alling stated that he had a recent visit with his primary care provider. At that time his carvedilol was decreased from 25 mg daily to 12.5 mg daily. Strongly encouraged Mr. Loughlin to seek medical attention today during visit. He declined transport for evaluation of elevated blood pressure. Encouraged him to contact his primary care provider today for medication management. He stated that he would call his primary care doctor  after wound visit today. Also encouraged him that if he experienced any symptoms of blurred vision, chest pain, shortness of breath to immediately seek medical attention. 07/17/18 on evaluation today patient's wound actually does seem to show signs of improvement based on the overall appearance of the wound bed today. There does not appear to show any signs of infection which is good news. There is no overall worsening which is also good news. He still has hyper granular tissue will use in the [employer] followed by [facility] Dressing this point. 07/31/18 on evaluation today patient's wound bed actually show signs of improvement with good epithelialization especially in the upper portion of the wound. He has been tolerating the dressing changes without complication in general. Overall I'm extremely happy with how things stand. No fevers, chills, nausea, or vomiting noted at this time. 08/28/18 on evaluation today patient appears to be doing a little better in regard to his lower extremity ulcer. With that being said it appears to be very hyper granular I think this is directly attributed to the fact that he continues to use the [employer] underneath the [facility] Dressing. Although this helps not to  stick unfortunately also think he's not getting the benefit of the [facility] Dressing particular and subsequently is causing too much moisture buildup hence the hyper granulation. Fortunately there does not appear to be any signs of infection at this time which is good news. 09/03/18 on evaluation today patient appears to be doing well in regard to his lower [name] ulcer. He has been tolerating the dressing changes without complication. Fortunately he has much less hyper granular tissue the noted last week I do think using silver nitrate in changing to using the [facility] Dressing without the mepitel has made a big difference for him this is good news. 09/18/18 on evaluation today patient appears  to be doing excellent in regard to his right lower [name] ulcer. This is actually significantly smaller even compared to the last evaluation. Overall I'm very pleased in this regard. I'm a recommend that we likely repeat the silver nitrate today based on what I'm seeing. 10/02/18 on evaluation today patient appears to be doing excellent in regard to his lower extremity wound on the right. He's been tolerating the dressing changes without complication. Fortunately there's no signs of infection at this time. Overall been very pleased with how things seem to be progressing. No fevers, chills, nausea, or vomiting noted at this time. 10/16/18 on evaluation today patient actually appears to be doing much better in regard to his lower extremity wound on the right. This is shown signs of good improvement and is indeed measuring smaller he is on a excellent track as far as healing is concerned. My hope is this will be healed of the next several weeks. Fortunately there is no evidence of infection at this time. He does seem to be doing everything that I'm recommending for him 10/30/18 on evaluation today patient appears to be doing better in regard to his lower extremity ulcer. He's been tolerating the dressing changes without complication. Fortunately the Hydrofera Blue Dressing to be doing the job. He has made excellent progress. With that being said this is still going somewhat slow but nonetheless is always making good progress at this point. 5/18-Patient was in to be seen for right leg ulcer that appeared after scab above it was removed today. This area had healed completely. 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 The wound has not  healed though it has been nearly 2 months since the injury occurred. He was seen in the emergency department at [facility] on May 9. At that time it was very painful and he described it as a "15 on a scale of 010". He also was found to have 3+ pitting edema to the bilateral lower extremities. He was prescribed a weeks course of Keflex. He is here today because the wound has failed to heal and he has a prior history in our clinic. ABI in clinic today was noncompressible. He did have formal vascular studies performed in 2019 which were normal. The last hemoglobin A1c I have available WELFORD, CHRISTMAS ([id number]) [id number] Page 3 of 13 for review was from March 21, 2021 and was elevated at 7.7%. The wound is fairly small and circular located about 3 inches above his right lateral malleolus. It is completely covered with eschar.  No surrounding erythema, no odor, no purulent drainage. 01/11/2022: The wound on his right lateral leg is a little bit smaller today. It has reaccumulated some slough and a small bit of eschar. It remains fairly painful. He has another area on his anterior tibia that he will not let me touch but I am concerned that there is a wound forming underneath the surface. 01/18/2022: Unfortunately, his wound is a little bit bigger today. He continues to accumulate slough on the wound surface. It is still quite tender. 01/25/2022: The patient bumped his wound on the car door which resulted in bleeding. He was seen at urgent care where it was redressed. Unfortunately, the wound is bigger again today. There is accumulated slough on the surface. Urgent care gave him some tramadol, apparently and he took this prior to his visit so he could tolerate debridement better. 02/02/2022: The wound is a little bit smaller today. The surface, however, has accumulated a fairly thick layer of slough. The periwound skin is intact and there is no obvious sign of  infection. 02/07/2022: The wound is a bit larger today. It has thick slough accumulation. The periwound skin is intact and there is no obvious erythema, induration, nor any odor. 02/16/2022: I took a culture last week due to the appearance of the wound and the patient's degree of pain. This grew out methicillin sensitive Staph aureus. Augmentin was prescribed. The patient states that the pain is markedly improved today. The wound also looks much better. It is smaller with less slough buildup and there is good granulation tissue emerging. 02/21/2022: The patient's pain is quite improved from prior visits. The wound is about the same size and has some accumulated slough on the surface. The base is a little bit fibrotic but there are some areas of granulation tissue filling in. 02/28/2022: The wound is a little bit narrower on its measurements but slightly longer. There is still some slough accumulation on the surface, but he has minimal pain and there is no concern for ongoing infection. Granulation tissue is emerging. 03/07/2022: During the week, he cut holes in his wrap because it was painful and too tight. As a result, his leg was quite a bit more swollen today and his wound was larger. The wound surface is fairly clean but a little bit dry. Minimal slough accumulation. Granulation tissue present. 03/14/2022: Once again, he did some arts and crafts projects with his wrap. On the other hand, however, his wound does look better. It is smaller today and is beginning to fill in. Minimal slough and a small amount of eschar accumulation. 03/27/2022: His wound measured bigger today, although it does not appear to be so on my impression. The wound continues to fill with granulation tissue. There is a little bit of slough and eschar accumulation. He continues to cut his wrap off of his feet and his edema control at the ankle is particularly poor with 3+ pitting edema. His insurance will not cover any sort of skin  substitute unfortunately. 04/04/2022: The wound is smaller today. It is more superficial with good granulation tissue and just a little bit of slough. He continues to cut away portions of his wrap which results in inadequate edema control. 04/11/2022: His wound measured a couple millimeters larger today. It continues to fill with granulation tissue and there is minimal slough on the surface. 04/19/2022: No significant change in the wound dimensions, but the surface is healthier and less fibrotic. 04/26/2022: The wound is a little bit bigger  again today. There is slough accumulation on the surface. Edema control is inadequate. He has been cutting the foot portion of his wrap off each week. 05/03/2022: The wound is a little smaller this week. There is still a fair amount of slough on the surface. His drainage was a little bit as greenish today, although not the blue-green typically associated with Pseudomonas. No significant odor. The underlying granulation tissue appears healthy. The culture that I took last week grew out methicillin sensitive Staph aureus. 05/10/2022: The wound is again a little bit smaller this week. He still has slough accumulation on the surface. Most of the surface has fairly decent granulation tissue, but there is still a fibrotic portion in the center near the caudal aspect of the wound. He did not pick up the Augmentin that I prescribed for his MSSA infection. He says he will get it today. 05/19/2022: His wound is smaller by half a centimeter today. There is a little bit of slough buildup, but less so than at previous visits. He has not been taking his Augmentin properly. He has been splitting the tablets in half and taking them on an irregular schedule. He says this is because it has been upsetting his stomach. 05/25/2022: His wound is a little bit smaller again this week. Very minimal slough accumulation. The wound is drier than I would like to see. He completed his course of  Augmentin. 10/19; patient has a wound on the right lateral lower leg in the setting of obvious chronic venous insufficiency. He has had a previous wound on the anterior leg that we dealt with several years ago. He has been using Hydrofera Blue. Kerlix and Coban 06/08/2022: The wound is smaller again this week. There is some light slough accumulation. It remains a little dry. 06/16/2022: Although the wound dimensions were the same this week, on visual inspection it appears smaller. It is clean, but still dry despite the use of hydrogel. 06/22/2022: His wound responded very well to endoform. It is smaller, moisture balance is excellent, and the surface has lovely granulation tissue. 06/29/2022: His wound is smaller again this week. The granulation tissue is a little bit hypertrophic. No concern for infection. Electronic Signature(s) Signed: 06/29/2022 9:41:52 AM By: Duanne Guess MD FACS Entered By: Duanne Guess on 06/29/2022 09:41:52 Arnetha Massy ([id number]) [id number] Page 4 of 13 -------------------------------------------------------------------------------- Chemical Cauterization Details Patient Name: Date of Service: Anthony Barnes [name] 06/29/2022 9:15 A M Medical Record Number: [id number] Patient Account Number: [id number] Date of Birth/Sex: Treating RN: 06-07-1956 (66 y.o. Valma Cava Primary Care Provider: Hoy Register Other Clinician: Referring Provider: Treating Provider/Extender: Camillo Flaming Weeks in Treatment: 25 Procedure Performed for: Wound #2 Right,Lateral Lower Leg Performed By: Physician Duanne Guess, MD Post Procedure Diagnosis Same as Pre-procedure Notes Scribed for Dr. Lady Gary by Tommie Ard, RN Electronic Signature(s) Signed: 06/29/2022 10:58:45 AM By: Duanne Guess MD FACS Signed: 06/29/2022 4:56:15 PM By: Tommie Ard RN Entered By: Tommie Ard on 06/29/2022  09:39:39 -------------------------------------------------------------------------------- Physical Exam Details Patient Name: Date of Service: Anthony Barnes, Anthony [name] 06/29/2022 9:15 A M Medical Record Number: [id number] Patient Account Number: [id number] Date of Birth/Sex: Treating RN: 1955-11-21 (66 y.o. M) Primary Care Provider: Hoy Register Other Clinician: Referring Provider: Treating Provider/Extender: Camillo Flaming Weeks in Treatment: 25 Constitutional Hypertensive, asymptomatic. . . . No acute distress.Marland Kitchen Respiratory Normal work of breathing on room air.. Notes 06/29/2022: His wound is smaller again this week. The granulation tissue is a little  bit hypertrophic. No concern for infection. Electronic Signature(s) Signed: 06/29/2022 9:42:24 AM By: Duanne Guess MD FACS Entered By: Duanne Guess on 06/29/2022 09:42:23 -------------------------------------------------------------------------------- Physician Orders Details Patient Name: Date of Service: Anthony Barnes, Anthony [name] 06/29/2022 9:15 A M Medical Record Number: [id number] Patient Account Number: [id number] Date of Birth/Sex: Treating RN: 28-Jan-1956 (66 y.o. Valma Cava Primary Care Provider: Hoy Register Other Clinician: Referring Provider: Treating Provider/Extender: Camillo Flaming Weeks in Treatment: 25 Verbal / Phone Orders: No Diagnosis Coding RYZEN, DEADY ([id number]) [id number] Page 5 of 13 ICD-10 Coding Code Description [phone] Non-pressure chronic ulcer of other part of right lower leg with fat layer exposed E11.622 Type 2 diabetes mellitus with other skin ulcer E11.9 Type 2 diabetes mellitus without complications I10 Essential (primary) hypertension I87.2 Venous insufficiency (chronic) (peripheral) Follow-up Appointments ppointment in 2 weeks. - Dr. Lady Gary - room 2 Return A Nurse Visit: - Rm 3 Tuesday 07/04/22 at  10:30 Anesthetic Wound #2 Right,Lateral Lower Leg (In clinic) Topical Lidocaine 5% applied to wound bed Bathing/ Shower/ Hygiene May shower with protection but do not get wound dressing(s) wet. - may purchase a cast protector from [facility] or [facility] Edema Control - Lymphedema / SCD / Other Elevate legs to the level of the heart or above for 30 minutes daily and/or when sitting, a frequency of: Avoid standing for long periods of time. Patient to wear own compression stockings every day. Exercise regularly Moisturize legs daily. Compression stocking or Garment 20-30 mm/Hg pressure to: - to L left until R leg is healed Wound Treatment Wound #2 - Lower Leg Wound Laterality: Right, Lateral Cleanser: Soap and Water 1 x Per Week/30 Days Discharge Instructions: May shower and wash wound with dial antibacterial soap and water prior to dressing change. Cleanser: Wound Cleanser 1 x Per Week/30 Days Discharge Instructions: Cleanse the wound with wound cleanser prior to applying a clean dressing using gauze sponges, not tissue or cotton balls. Peri-Wound Care: Triamcinolone 15 (g) 1 x Per Week/30 Days Discharge Instructions: Use triamcinolone 15 (g) as directed Peri-Wound Care: Sween Lotion (Moisturizing lotion) 1 x Per Week/30 Days Discharge Instructions: Apply moisturizing lotion as directed Prim Dressing: Endoform 2x2 in 1 x Per Week/30 Days ary Discharge Instructions: Moisten with saline Secondary Dressing: Woven Gauze Sponge, Non-Sterile 4x4 in 1 x Per Week/30 Days Discharge Instructions: Apply over primary dressing as directed. Secured With: 82M Medipore H Soft Cloth Surgical T ape, 4 x 10 (in/yd) 1 x Per Week/30 Days Discharge Instructions: Secure with tape as directed. Compression Wrap: ThreePress (3 layer compression wrap) 1 x Per Week/30 Days Discharge Instructions: Apply three layer compression as directed. Electronic Signature(s) Signed: 06/29/2022 10:58:45 AM By: Duanne Guess MD  FACS Entered By: Duanne Guess on 06/29/2022 09:42:36 -------------------------------------------------------------------------------- Problem List Details Patient Name: Date of Service: Anthony Barnes, Anthony [name] 06/29/2022 9:15 A M Medical Record Number: [id number] Patient Account Number: [id number] Date of Birth/Sex: Treating RN: [birth year] (67 y.o. M) Primary Care Provider: Hoy Register Other Clinician: Referring Provider: Treating Provider/Extender: Camillo Flaming Weeks in Treatment: [address] ([id number]) [id number] Page 6 of 13 Active Problems ICD-10 Encounter Code Description Active Date MDM Diagnosis L97.812 Non-pressure chronic ulcer of other part of right lower leg with fat layer 01/02/2022 No Yes exposed E11.622 Type 2 diabetes mellitus with other skin ulcer 01/02/2022 No Yes E11.9 Type 2 diabetes mellitus without complications 01/02/2022 No Yes I10 Essential (primary) hypertension 01/02/2022 No Yes I87.2 Venous insufficiency (chronic) (peripheral) 01/02/2022 No  Yes Inactive Problems Resolved Problems Electronic Signature(s) Signed: 06/29/2022 9:41:03 AM By: Duanne Guess MD FACS Entered By: Duanne Guess on 06/29/2022 09:41:03 -------------------------------------------------------------------------------- Progress Note Details Patient Name: Date of Service: Anthony Barnes, Anthony [name] 06/29/2022 9:15 A M Medical Record Number: [id number] Patient Account Number: [id number] Date of Birth/Sex: Treating RN: 1956/05/23 (66 y.o. M) Primary Care Provider: Hoy Register Other Clinician: Referring Provider: Treating Provider/Extender: Camillo Flaming Weeks in Treatment: 25 Subjective Chief Complaint Information obtained from Patient Right LE Ulcer History of Present Illness (HPI) ADMISSION 03/12/18 This is a 66 year old and it was a type II diabetic reasonably poorly controlled with a recent  hemoglobin A1c of 8.2. He was tending to his lawn on 01/15/18 when his weed [name] sent a rock up word hitting him in the right anterior leg. He was seen in his primary M.D. office is same time. An x-ray showed no abnormality. He was given a course of cephalexin. Since then he's been applying peroxide and topical antibiotics to the wound. He does not have a history of chronic wounds however he does have chronic edema in the lower legs. He is complaining of pain in the right leg wound but no real history of claudication. Past medical history includes type 2 diabetes, hypertension, morbid obesity. ABIs in the right leg were noncompressible 03/19/18 on evaluation today patient appears to be tolerating the wrap in general fairly well. He states he's not having that much pain in regard to the wound itself. With that being said he is having some issues with slough buildup on the surface of the wound the Iodoflex does seem to be helpful in that regard. He is still having discomfort he wonders if I can send in a prescription for ibuprofen or something of like to help him out. I do believe that the prox end may be of benefit for him. ZEDRICK, SPRINGSTEEN ([id number]) [id number] Page 7 of 13 03/25/18 on evaluation today patient appears to be doing very well in regard to his right lateral lower [name]. He's been tolerating the dressing changes without complication that is the wraps. Nonetheless he does continue to have pain he states is been dreading the possibility of having to have debridement again today. His first debridement experience was not optimal. With that being said he does seem to be showing signs of improvement there does appear to be little bit more granulation he does have a lot of slough that really does need to breeding away. 04/04/18;the patient went for arterial studies that were really quite normal. ABI on the right at 1.19 with triphasic waveforms on  the left 1.11 with triphasic waveforms. TBI 0.93 on the right and 0.95 on the left. This suggests T should be able to tolerate even 4 layer compression. He is however complaining of a lot of pain with our wraps I'm wondering whether the pain is simply Iodoflex. I changed him to [employer] I'm still going to try to keep him in 3 layer compression 04/12/18 on evaluation today patient actually appears to be doing rather well in regard to his right lower [name] ulcer. He is making good progress although it is slow still I do believe that the Santyl is much better than the Iodoflex. The good news is the patient seems to be tolerating the dressing changes without complication now that we've gotten good of the Iodoflex which really did burn him quite significantly. Otherwise there's no evidence of infection. 04/17/18 on evaluation today  patient actually appears to be showing signs of improvement in regard to the ulcer. Unfortunately he's had somewhat of a rough day due to the fact that he found out that one of his friends suddenly and unexpectedly passed today. He states he's not sure he's in the frame of mind to allow me to debride the wound at this point. Nonetheless I do feel like he is showing signs of the wound getting better little by little each week. 04/24/18 on evaluation today patient's ulcer actually appears to be showing some signs of improvement albeit slow. He has been tolerating the dressing changes without complication except for the wrap which she states was so tight that he had to remove it it was causing a lot of discomfort. Fortunately there is no evidence of infection at this point. He states he only wore the wrap until about the time he got home last week and that he had to take it off. Nonetheless he does not have any other compression stockings, Juxta-Lite wraps, or anything otherwise to really help at this point as far as compression is concerned. 05/01/18 on  evaluation today patient actually appears to be doing fairly well in regard to his lower extremity ulcer. He has been tolerating the dressing changes currently without complication. The wrap does seem to be helping as far as the fluid management is concerned. Wound bed itself actually show signs of good improvement although there is some Slough noted there's not as much as previous and he actually has fairly decent granulation noted as well. Overall I'm pleased with the progress of to this point. The patient would prefer not to have any debridement today he states he's actually not feeling too well in general and he really does not want any additional discomfort typically debridement is fairly uncomfortable for him. 05/08/18 on evaluation today patient actually appears to be doing a little better in regard to his lower extremity ulcer. He has been tolerating the dressing changes without complication. With that being said I'm actually quite pleased with the fact the wound is not appear to be infected and in general has made a little progress. With that being said I do believe we need to perform some debridement to clean away the necrotic tissue on the surface of the wound. 05/15/18 on evaluation today patient actually appears to be doing a little better in regard to his wound. Fortunately there is some improvement noted fortunately there's also no significant evidence of infection at this point in time. He does have continued pain that really nothing different than previous he did get his Juxta-Lite wrap. 05/29/18 on evaluation today patient actually appears to be doing somewhat better in regard to his wound currently. He's been tolerating the dressing changes without complication. With that being said he has been performing the [facility] Dressing changes every day at home. I'm pretty sure that we told him every other day last week or when I saw him rather two weeks ago. With that being said obviously  did not hurt to do it daily just that obviously is gonna run out of supplies sooner and that could get him into trouble as far as his insurance is concerned. Nonetheless he at this point still continues to have pain although honestly I don't feel like it's as bad as what it was in the past just based on his reactions at this point. 06/12/18 on evaluation today patient actually appears to be doing better in regard to his lower [name] ulcer. He's  been tolerating the dressing changes without complication. Fortunately there does not appear to be any evidence of infection at this time. No fevers, chills, nausea, or vomiting noted at this time. 06/19/18 evaluation today patient's ulcer on the lower extremity appears to be doing better at this point. he has been tolerating the dressing changes. The patient seems to be somewhat depressed about the progress of his wound although the overall appearance at this time seems to be doing well. In general I'm very pleased with the appearance today he does have some biofilm on the surface of the wound as well as of hyper granular tissue we may want to utilize a little bit of silver nitrate today. 06/27/2018; patient comes into clinic today for a wound on the right lateral calf likely related to chronic venous insufficiency. He has been using medihoney covered with Hydrofera Blue. Wound surface actually looks quite good 07/03/2018 seen today for follow-up and management of lower extremity ulcer right lateral shin. T olerating dressing changes. He has a small amount of biofilm today. Will attempt to remove a portion of the biofilm as he tolerates. He becomes very anxious with wound treatments. He would benefit from layer compression wraps however he refuses compression wraps or anything that smokes his legs. He expressed an inability to tolerate compression wraps. Juxta lite wraps have been ordered. He has been instructed on the appropriate application of the juxta leg  wraps. His blood pressure today on visit was 200/120. He denies any blurred vision, chest pain, dizziness, shortness of breath, or difficulty with mobility. Mr. Cwikla stated that he had a recent visit with his primary care provider. At that time his carvedilol was decreased from 25 mg daily to 12.5 mg daily. Strongly encouraged Mr. Flythe to seek medical attention today during visit. He declined transport for evaluation of elevated blood pressure. Encouraged him to contact his primary care provider today for medication management. He stated that he would call his primary care doctor after wound visit today. Also encouraged him that if he experienced any symptoms of blurred vision, chest pain, shortness of breath to immediately seek medical attention. 07/17/18 on evaluation today patient's wound actually does seem to show signs of improvement based on the overall appearance of the wound bed today. There does not appear to show any signs of infection which is good news. There is no overall worsening which is also good news. He still has hyper granular tissue will use in the [employer] followed by [facility] Dressing this point. 07/31/18 on evaluation today patient's wound bed actually show signs of improvement with good epithelialization especially in the upper portion of the wound. He has been tolerating the dressing changes without complication in general. Overall I'm extremely happy with how things stand. No fevers, chills, nausea, or vomiting noted at this time. 08/28/18 on evaluation today patient appears to be doing a little better in regard to his lower extremity ulcer. With that being said it appears to be very hyper granular I think this is directly attributed to the fact that he continues to use the [employer] underneath the [facility] Dressing. Although this helps not to stick unfortunately also think he's not getting the benefit of the [facility] Dressing particular and subsequently is  causing too much moisture buildup hence the hyper granulation. Fortunately there does not appear to be any signs of infection at this time which is good news. 09/03/18 on evaluation today patient appears to be doing well in regard to his lower  [name] ulcer. He has been tolerating the dressing changes without complication. Fortunately he has much less hyper granular tissue the noted last week I do think using silver nitrate in changing to using the [facility] Dressing without the mepitel has made a big difference for him this is good news. 09/18/18 on evaluation today patient appears to be doing excellent in regard to his right lower [name] ulcer. This is actually significantly smaller even compared to the last evaluation. Overall I'm very pleased in this regard. I'm a recommend that we likely repeat the silver nitrate today based on what I'm seeing. 10/02/18 on evaluation today patient appears to be doing excellent in regard to his lower extremity wound on the right. He's been tolerating the dressing changes without complication. Fortunately there's no signs of infection at this time. Overall been very pleased with how things seem to be progressing. No fevers, chills, nausea, or vomiting noted at this time. 10/16/18 on evaluation today patient actually appears to be doing much better in regard to his lower extremity wound on the right. This is shown signs of good improvement and is indeed measuring smaller he is on a excellent track as far as healing is concerned. My hope is this will be healed of the next several weeks. Fortunately there is no evidence of infection at this time. He does seem to be doing everything that I'm recommending for him 10/30/18 on evaluation today patient appears to be doing better in regard to his lower extremity ulcer. He's been tolerating the dressing changes without MUNACHIMSO, PALIN ([id number]) [phone].pdf Page 8 of 13 complication.  Fortunately the Hydrofera Blue Dressing to be doing the job. He has made excellent progress. With that being said this is still going somewhat slow but nonetheless is always making good progress at this point. 5/18-Patient was in to be seen for right leg ulcer that appeared after scab above it was removed today. This area had healed completely. 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 The wound has not healed though it has been nearly 2 months since the injury occurred. He was seen in the emergency department at [facility] on May 9. At that time it was very painful and he described it as a "15 on a scale of 0oo10". He also was found to have 3+ pitting edema to the bilateral lower extremities. He was prescribed a weeks course of Keflex. He is here today because the wound has failed to heal and he has a prior history in our clinic. ABI in clinic today was noncompressible. He did have formal vascular studies performed in 2019 which were normal. The last hemoglobin A1c I have available for review was from March 21, 2021 and was elevated at 7.7%. The wound is fairly small and circular located about 3 inches above his right lateral malleolus. It is completely covered with eschar. No surrounding erythema, no odor, no purulent drainage. 01/11/2022: The wound on his right lateral leg is a little bit smaller today. It has reaccumulated some slough and a small bit of eschar. It remains fairly painful. He has another area on his anterior tibia that he will not let me touch but I am concerned that there is a wound  forming underneath the surface. 01/18/2022: Unfortunately, his wound is a little bit bigger today. He continues to accumulate  slough on the wound surface. It is still quite tender. 01/25/2022: The patient bumped his wound on the car door which resulted in bleeding. He was seen at urgent care where it was redressed. Unfortunately, the wound is bigger again today. There is accumulated slough on the surface. Urgent care gave him some tramadol, apparently and he took this prior to his visit so he could tolerate debridement better. 02/02/2022: The wound is a little bit smaller today. The surface, however, has accumulated a fairly thick layer of slough. The periwound skin is intact and there is no obvious sign of infection. 02/07/2022: The wound is a bit larger today. It has thick slough accumulation. The periwound skin is intact and there is no obvious erythema, induration, nor any odor. 02/16/2022: I took a culture last week due to the appearance of the wound and the patient's degree of pain. This grew out methicillin sensitive Staph aureus. Augmentin was prescribed. The patient states that the pain is markedly improved today. The wound also looks much better. It is smaller with less slough buildup and there is good granulation tissue emerging. 02/21/2022: The patient's pain is quite improved from prior visits. The wound is about the same size and has some accumulated slough on the surface. The base is a little bit fibrotic but there are some areas of granulation tissue filling in. 02/28/2022: The wound is a little bit narrower on its measurements but slightly longer. There is still some slough accumulation on the surface, but he has minimal pain and there is no concern for ongoing infection. Granulation tissue is emerging. 03/07/2022: During the week, he cut holes in his wrap because it was painful and too tight. As a result, his leg was quite a bit more swollen today and his wound was larger. The wound surface is fairly clean but a little bit dry. Minimal slough accumulation. Granulation tissue present. 03/14/2022: Once again, he did  some arts and crafts projects with his wrap. On the other hand, however, his wound does look better. It is smaller today and is beginning to fill in. Minimal slough and a small amount of eschar accumulation. 03/27/2022: His wound measured bigger today, although it does not appear to be so on my impression. The wound continues to fill with granulation tissue. There is a little bit of slough and eschar accumulation. He continues to cut his wrap off of his feet and his edema control at the ankle is particularly poor with 3+ pitting edema. His insurance will not cover any sort of skin substitute unfortunately. 04/04/2022: The wound is smaller today. It is more superficial with good granulation tissue and just a little bit of slough. He continues to cut away portions of his wrap which results in inadequate edema control. 04/11/2022: His wound measured a couple millimeters larger today. It continues to fill with granulation tissue and there is minimal slough on the surface. 04/19/2022: No significant change in the wound dimensions, but the surface is healthier and less fibrotic. 04/26/2022: The wound is a little bit bigger again today. There is slough accumulation on the surface. Edema control is inadequate. He has been cutting the foot portion of his wrap off each week. 05/03/2022: The wound is a little smaller this week. There is still a fair amount of slough on the surface. His drainage was a little bit as greenish today, although not the blue-green typically associated  with Pseudomonas. No significant odor. The underlying granulation tissue appears healthy. The culture that I took last week grew out methicillin sensitive Staph aureus. 05/10/2022: The wound is again a little bit smaller this week. He still has slough accumulation on the surface. Most of the surface has fairly decent granulation tissue, but there is still a fibrotic portion in the center near the caudal aspect of the wound. He did not pick up the  Augmentin that I prescribed for his MSSA infection. He says he will get it today. 05/19/2022: His wound is smaller by half a centimeter today. There is a little bit of slough buildup, but less so than at previous visits. He has not been taking his Augmentin properly. He has been splitting the tablets in half and taking them on an irregular schedule. He says this is because it has been upsetting his stomach. 05/25/2022: His wound is a little bit smaller again this week. Very minimal slough accumulation. The wound is drier than I would like to see. He completed his course of Augmentin. 10/19; patient has a wound on the right lateral lower leg in the setting of obvious chronic venous insufficiency. He has had a previous wound on the anterior leg that we dealt with several years ago. He has been using Hydrofera Blue. Kerlix and Coban 06/08/2022: The wound is smaller again this week. There is some light slough accumulation. It remains a little dry. 06/16/2022: Although the wound dimensions were the same this week, on visual inspection it appears smaller. It is clean, but still dry despite the use of hydrogel. 06/22/2022: His wound responded very well to endoform. It is smaller, moisture balance is excellent, and the surface has lovely granulation tissue. 06/29/2022: His wound is smaller again this week. The granulation tissue is a little bit hypertrophic. No concern for infection. KACEN, MELLINGER ([id number]) [id number] Page 9 of 13 Patient History Information obtained from Patient. Family History Cancer - Mother, Diabetes - Mother,Father, Hypertension - Mother,Father, Kidney Disease - Siblings, Stroke - Father, No family history of Heart Disease, Hereditary Spherocytosis, Lung Disease, Seizures, Thyroid Problems, Tuberculosis. Social History Former smoker - ended on 08/14/1990, Marital Status - Married, Alcohol Use - Moderate, Drug Use - No History, Caffeine Use -  Daily. Medical History Eyes Denies history of Cataracts, Glaucoma, Optic Neuritis Ear/Nose/Mouth/Throat Denies history of Chronic sinus problems/congestion, Middle ear problems Hematologic/Lymphatic Denies history of Anemia, Hemophilia, Human Immunodeficiency Virus, Lymphedema, Sickle Cell Disease Respiratory Denies history of Aspiration, Asthma, Chronic Obstructive Pulmonary Disease (COPD), Pneumothorax, Sleep Apnea, Tuberculosis Cardiovascular Patient has history of Hypertension Denies history of Angina, Arrhythmia, Congestive Heart Failure, Coronary Artery Disease, Deep Vein Thrombosis, Hypotension, Myocardial Infarction, Peripheral Arterial Disease, Peripheral Venous Disease, Phlebitis, Vasculitis Gastrointestinal Denies history of Cirrhosis , Colitis, Crohnoos, Hepatitis A, Hepatitis B, Hepatitis C Endocrine Patient has history of Type II Diabetes Genitourinary Denies history of End Stage Renal Disease Immunological Denies history of Lupus Erythematosus, Raynaudoos, Scleroderma Integumentary (Skin) Denies history of History of Burn Musculoskeletal Denies history of Gout, Rheumatoid Arthritis, Osteoarthritis, Osteomyelitis Neurologic Denies history of Dementia, Neuropathy, Quadriplegia, Paraplegia, Seizure Disorder Oncologic Denies history of Received Chemotherapy, Received Radiation Psychiatric Denies history of Anorexia/bulimia, Confinement Anxiety Hospitalization/Surgery History - esophagogastroduodenoscopy. - brain aneurysm surgery. Medical A Surgical History Notes nd Constitutional Symptoms (General Health) obesity Gastrointestinal diverticulitis Neurologic neuropathy Objective Constitutional Hypertensive, asymptomatic. No acute distress.. Vitals Time Taken: 9:18 AM, Height: 75 in, Weight: 310 lbs, BMI: 38.7, Temperature: 97.9 F, Pulse: 75 bpm, Respiratory Rate: 16 breaths/min, Blood  Pressure: 159/97 mmHg. Respiratory Normal work of breathing on room  air.. General Notes: 06/29/2022: His wound is smaller again this week. The granulation tissue is a little bit hypertrophic. No concern for infection. Integumentary (Hair, Skin) Wound #2 status is Open. Original cause of wound was Trauma. The date acquired was: 12/03/2021. The wound has been in treatment 25 weeks. The wound is located on the Right,Lateral Lower Leg. The wound measures 3.5cm length x 1cm width x 0.1cm depth; 2.749cm^2 area and 0.275cm^3 volume. There is Fat Layer (Subcutaneous Tissue) exposed. There is no tunneling or undermining noted. There is a medium amount of serosanguineous drainage noted. The wound margin is distinct with the outline attached to the wound base. There is medium (34-66%) red granulation within the wound bed. There is a medium (34-66%) amount of necrotic tissue within the wound bed including Adherent Slough. The periwound skin appearance had no abnormalities noted for texture. The periwound skin appearance had no abnormalities noted for color. The periwound skin appearance exhibited: Dry/Scaly. Periwound temperature was noted as No Abnormality. Assessment YAMIR, CARIGNAN ([id number]) [id number] Page 10 of 13 Active Problems ICD-10 Non-pressure chronic ulcer of other part of right lower leg with fat layer exposed Type 2 diabetes mellitus with other skin ulcer Type 2 diabetes mellitus without complications Essential (primary) hypertension Venous insufficiency (chronic) (peripheral) Procedures Wound #2 Pre-procedure diagnosis of Wound #2 is a Venous Leg Ulcer located on the Right,Lateral Lower Leg . An Chemical Cauterization procedure was performed by Duanne Guess, MD. Post procedure Diagnosis Wound #2: Same as Pre-Procedure Notes: Scribed for Dr. Lady Gary by Tommie Ard, RN Plan Follow-up Appointments: Return Appointment in 2 weeks. - Dr. Lady Gary - room 2 Nurse Visit: - Rm 3 Tuesday 07/04/22 at 10:30 Anesthetic: Wound #2  Right,Lateral Lower Leg: (In clinic) Topical Lidocaine 5% applied to wound bed Bathing/ Shower/ Hygiene: May shower with protection but do not get wound dressing(s) wet. - may purchase a cast protector from [facility] or [facility] Edema Control - Lymphedema / SCD / Other: Elevate legs to the level of the heart or above for 30 minutes daily and/or when sitting, a frequency of: Avoid standing for long periods of time. Patient to wear own compression stockings every day. Exercise regularly Moisturize legs daily. Compression stocking or Garment 20-30 mm/Hg pressure to: - to L left until R leg is healed WOUND #2: - Lower Leg Wound Laterality: Right, Lateral Cleanser: Soap and Water 1 x Per Week/30 Days Discharge Instructions: May shower and wash wound with dial antibacterial soap and water prior to dressing change. Cleanser: Wound Cleanser 1 x Per Week/30 Days Discharge Instructions: Cleanse the wound with wound cleanser prior to applying a clean dressing using gauze sponges, not tissue or cotton balls. Peri-Wound Care: Triamcinolone 15 (g) 1 x Per Week/30 Days Discharge Instructions: Use triamcinolone 15 (g) as directed Peri-Wound Care: Sween Lotion (Moisturizing lotion) 1 x Per Week/30 Days Discharge Instructions: Apply moisturizing lotion as directed Prim Dressing: Endoform 2x2 in 1 x Per Week/30 Days ary Discharge Instructions: Moisten with saline Secondary Dressing: Woven Gauze Sponge, Non-Sterile 4x4 in 1 x Per Week/30 Days Discharge Instructions: Apply over primary dressing as directed. Secured With: 15M Medipore H Soft Cloth Surgical T ape, 4 x 10 (in/yd) 1 x Per Week/30 Days Discharge Instructions: Secure with tape as directed. Com pression Wrap: ThreePress (3 layer compression wrap) 1 x Per Week/30 Days Discharge Instructions: Apply three layer compression as directed. 06/29/2022: His wound is smaller again this week. The granulation tissue  is a little bit hypertrophic. No concern for  infection. No debridement was necessary today. I chemically cauterized the hypertrophic granulation tissue with silver nitrate. We will continue endoform and 3 layer compression. Due to the Thanksgiving holiday, he will have a nurse visit next week and see me in 2 weeks. Electronic Signature(s) Signed: 06/29/2022 9:43:04 AM By: Duanne Guess MD FACS Entered By: Duanne Guess on 06/29/2022 09:43:04 Arnetha Massy ([id number]) [id number] Page 11 of 13 -------------------------------------------------------------------------------- HxROS Details Patient Name: Date of Service: Anthony Barnes [name] 06/29/2022 9:15 A M Medical Record Number: [id number] Patient Account Number: [id number] Date of Birth/Sex: Treating RN: 04-Jan-1956 (66 y.o. M) Primary Care Provider: Hoy Register Other Clinician: Referring Provider: Treating Provider/Extender: Camillo Flaming Weeks in Treatment: 25 Information Obtained From Patient Constitutional Symptoms (General Health) Medical History: Past Medical History Notes: obesity Eyes Medical History: Negative for: Cataracts; Glaucoma; Optic Neuritis Ear/Nose/Mouth/Throat Medical History: Negative for: Chronic sinus problems/congestion; Middle ear problems Hematologic/Lymphatic Medical History: Negative for: Anemia; Hemophilia; Human Immunodeficiency Virus; Lymphedema; Sickle Cell Disease Respiratory Medical History: Negative for: Aspiration; Asthma; Chronic Obstructive Pulmonary Disease (COPD); Pneumothorax; Sleep Apnea; Tuberculosis Cardiovascular Medical History: Positive for: Hypertension Negative for: Angina; Arrhythmia; Congestive Heart Failure; Coronary Artery Disease; Deep Vein Thrombosis; Hypotension; Myocardial Infarction; Peripheral Arterial Disease; Peripheral Venous Disease; Phlebitis; Vasculitis Gastrointestinal Medical History: Negative for: Cirrhosis ; Colitis; Crohns; Hepatitis A;  Hepatitis B; Hepatitis C Past Medical History Notes: diverticulitis Endocrine Medical History: Positive for: Type II Diabetes Time with diabetes: 8 years Treated with: Oral agents Blood sugar tested every day: No Genitourinary Medical History: Negative for: End Stage Renal Disease Immunological Medical History: Negative for: Lupus Erythematosus; Raynauds; Scleroderma Integumentary (Skin) Medical History: Negative for: History of Burn Musculoskeletal Medical HistoryMarland Kitchen ELIAKIM, TENDLER ([id number]) [phone].pdf Page 12 of 13 Negative for: Gout; Rheumatoid Arthritis; Osteoarthritis; Osteomyelitis Neurologic Medical History: Negative for: Dementia; Neuropathy; Quadriplegia; Paraplegia; Seizure Disorder Past Medical History Notes: neuropathy Oncologic Medical History: Negative for: Received Chemotherapy; Received Radiation Psychiatric Medical History: Negative for: Anorexia/bulimia; Confinement Anxiety Immunizations Pneumococcal Vaccine: Received Pneumococcal Vaccination: No Immunization Notes: tetanus 2 years ago Implantable Devices None Hospitalization / Surgery History Type of Hospitalization/Surgery esophagogastroduodenoscopy brain aneurysm surgery Family and Social History Cancer: Yes - Mother; Diabetes: Yes - Mother,Father; Heart Disease: No; Hereditary Spherocytosis: No; Hypertension: Yes - Mother,Father; Kidney Disease: Yes - Siblings; Lung Disease: No; Seizures: No; Stroke: Yes - Father; Thyroid Problems: No; Tuberculosis: No; Former smoker - ended on 08/14/1990; Marital Status - Married; Alcohol Use: Moderate; Drug Use: No History; Caffeine Use: Daily; Financial Concerns: No; Food, Clothing or Shelter Needs: No; Support System Lacking: No; Transportation Concerns: No Electronic Signature(s) Signed: 06/29/2022 10:58:45 AM By: Duanne Guess MD FACS Entered By: Duanne Guess on 06/29/2022  09:42:00 -------------------------------------------------------------------------------- SuperBill Details Patient Name: Date of Service: Anthony Barnes, Anthony [name] 06/29/2022 Medical Record Number: [id number] Patient Account Number: [id number] Date of Birth/Sex: Treating RN: 05-19-1956 (66 y.o. M) Primary Care Provider: Hoy Register Other Clinician: Referring Provider: Treating Provider/Extender: Camillo Flaming Weeks in Treatment: 25 Diagnosis Coding ICD-10 Codes Code Description [phone] Non-pressure chronic ulcer of other part of right lower leg with fat layer exposed E11.622 Type 2 diabetes mellitus with other skin ulcer E11.9 Type 2 diabetes mellitus without complications I10 Essential (primary) hypertension I87.2 Venous insufficiency (chronic) (peripheral) Facility Procedures : [place], [place] CPT4 Code: ARLES E ([id number] ICD- L97 Description: ) [id number] - CHEM CAUT GRANULATION TISS 10 Diagnosis Description .812 Non-pressure chronic ulcer  of other part of right lower leg with fat layer expose Modifier: [id number] 1 d Quantity: .pdf Page 13 of 13 Physician Procedures : CPT4 Code Description Modifier [id number] - WC PHYS LEVEL 3 - EST PT 25 ICD-10 Diagnosis Description 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osed E11.622 Type 2 diabetes mellitus with other skin ulcer  E11.9 Type 2 diabetes mellitus without complications I87.2 Venous insufficiency (chronic) (peripheral) Quantity: 1 : [id number] - WC PHYS CHEM CAUT GRAN TISSUE ICD-10 Diagnosis Description 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osed Quantity: 1 Electronic Signature(s) Signed: 06/29/2022 9:46:49 AM By: Duanne Guess MD FACS Entered By: Duanne Guess on 06/29/2022 09:46:49

## 2022-07-04 ENCOUNTER — Encounter (HOSPITAL_BASED_OUTPATIENT_CLINIC_OR_DEPARTMENT_OTHER): Payer: Medicare Other | Admitting: General Surgery

## 2022-07-04 DIAGNOSIS — L97812 Non-pressure chronic ulcer of other part of right lower leg with fat layer exposed: Secondary | ICD-10-CM | POA: Diagnosis not present

## 2022-07-04 DIAGNOSIS — E119 Type 2 diabetes mellitus without complications: Secondary | ICD-10-CM | POA: Diagnosis not present

## 2022-07-04 DIAGNOSIS — E11622 Type 2 diabetes mellitus with other skin ulcer: Secondary | ICD-10-CM | POA: Diagnosis not present

## 2022-07-04 DIAGNOSIS — I10 Essential (primary) hypertension: Secondary | ICD-10-CM | POA: Diagnosis not present

## 2022-07-04 DIAGNOSIS — I872 Venous insufficiency (chronic) (peripheral): Secondary | ICD-10-CM | POA: Diagnosis not present

## 2022-07-04 NOTE — Progress Notes (Signed)
ZARIAN, COLPITTS ([id number]) [id number] Page 1 of 4 Visit Report for 07/04/2022 Arrival Information Details Patient Name: Date of Service: Anthony Barnes [name] 07/04/2022 10:30 A M Medical Record Number: [id number] Patient Account Number: [id number] Date of Birth/Sex: Treating RN: 24-Oct-1955 (66 y.o. [name] Primary Care Ali Mohl: Hoy Register Other Clinician: Referring Renetta Suman: Treating Roxy Mastandrea/Extender: Camillo Flaming Weeks in Treatment: 26 Visit Information History Since Last Visit Added or deleted any medications: No Patient Arrived: Ambulatory Any new allergies or adverse reactions: No Arrival Time: 10:48 Had a fall or experienced change in No Accompanied By: self activities of daily living that may affect Transfer Assistance: None risk of falls: Patient Identification Verified: Yes Signs or symptoms of abuse/neglect since last visito No Secondary Verification Process Completed: Yes Hospitalized since last visit: No Patient Requires Transmission-Based Precautions: No Implantable device outside of the clinic excluding No Patient Has Alerts: No cellular tissue based products placed in the center since last visit: Has Dressing in Place as Prescribed: Yes Has Compression in Place as Prescribed: Yes Pain Present Now: No Electronic Signature(s) Signed: 07/04/2022 4:08:33 PM By: Samuella Bruin Entered By: Samuella Bruin on 07/04/2022 10:48:44 -------------------------------------------------------------------------------- Compression Therapy Details Patient Name: Date of Service: Anthony Barnes, Anthony [name] 07/04/2022 10:30 A M Medical Record Number: [id number] Patient Account Number: [id number] Date of Birth/Sex: Treating RN: [birth year] (66 y.o. [name] Primary Care Harvis Mabus: Hoy Register Other Clinician: Referring Kely Dohn: Treating Uday Jantz/Extender: Camillo Flaming Weeks in Treatment: 26 Compression Therapy Performed for Wound Assessment: Wound #2 Right,Lateral Lower Leg Performed By: Clinician Samuella Bruin, RN Compression Type: Three Layer Electronic Signature(s) Signed: 07/04/2022 4:08:33 PM By: Samuella Bruin Entered By: Samuella Bruin on 07/04/2022 15:06:39 -------------------------------------------------------------------------------- Encounter Discharge Information Details Patient Name: Date of Service: Anthony Barnes, Anthony [name] 07/04/2022 10:30 A M Medical Record Number: [id number] Patient Account Number: [id number] Anthony, Barnes ([id number]) [id number] Page 2 of 4 Date of Birth/Sex: Treating RN: [birth year] (66 y.o. [name] Primary Care Harumi Yamin: Hoy Register Other Clinician: Referring Bryse Blanchette: Treating Arthuro Canelo/Extender: Camillo Flaming Weeks in Treatment: 47 Encounter Discharge Information Items Discharge Condition: Stable Ambulatory Status: Ambulatory Discharge Destination: Home Transportation: Private Auto Accompanied By: self Schedule Follow-up Appointment: Yes Clinical Summary of Care: Patient Declined Electronic Signature(s) Signed: 07/04/2022 4:08:33 PM By: Gelene Mink By: Samuella Bruin on 07/04/2022 15:08:45 -------------------------------------------------------------------------------- Patient/Caregiver Education Details Patient Name: Date of Service: Anthony Barnes, Anthony [name] 11/21/2023andnbsp10:30 A M Medical Record Number: [id number] Patient Account Number: [id number] Date of Birth/Gender: Treating RN: 17-Dec-1955 (66 y.o. [name] Primary Care Physician: Hoy Register Other Clinician: Referring Physician: Treating Physician/Extender: Camillo Flaming Weeks in Treatment: 51 Education Assessment Education Provided To: Patient Education Topics Provided Wound/Skin Impairment: Methods:  Explain/Verbal Responses: Reinforcements needed, State content correctly Electronic Signature(s) Signed: 07/04/2022 4:08:33 PM By: Samuella Bruin Entered By: Samuella Bruin on 07/04/2022 15:08:32 -------------------------------------------------------------------------------- Wound Assessment Details Patient Name: Date of Service: Anthony Barnes, Anthony [name] 07/04/2022 10:30 A M Medical Record Number: [id number] Patient Account Number: [id number] Date of Birth/Sex: Treating RN: [birth year] (66 y.o. [name] Primary Care Lyndzie Zentz: Hoy Register Other Clinician: Referring Mayur Duman: Treating Sitlali Koerner/Extender: Camillo Flaming Weeks in Treatment: 26 Wound Status Wound Number: 2 Primary Etiology: Venous Leg Ulcer Wound Location: Right, Lateral Lower Leg Wound Status: Open Wounding Event: Trauma Comorbid History: Hypertension, Type II Diabetes Date Acquired: 12/03/2021 OMERE, MARTI ([id number]) [id number].pdf Page 3 of 4 Weeks Of Treatment: 26 Clustered Wound:  No Wound Measurements Length: (cm) 3.5 Width: (cm) 1 Depth: (cm) 0.1 Area: (cm) 2.749 Volume: (cm) 0.275 % Reduction in Area: -159.3% % Reduction in Volume: -159.4% Epithelialization: Medium (34-66%) Wound Description Classification: Full Thickness Without Exposed Suppor Wound Margin: Distinct, outline attached Exudate Amount: Medium Exudate Type: Serosanguineous Exudate Color: red, brown t Structures Foul Odor After Cleansing: No Slough/Fibrino Yes Wound Bed Granulation Amount: Medium (34-66%) Exposed Structure Granulation Quality: Red Fascia Exposed: No Necrotic Amount: Medium (34-66%) Fat Layer (Subcutaneous Tissue) Exposed: Yes Necrotic Quality: Adherent Slough Tendon Exposed: No Muscle Exposed: No Joint Exposed: No Bone Exposed: No Periwound Skin Texture Texture Color No Abnormalities Noted: Yes No Abnormalities Noted: Yes Moisture Temperature  / Pain No Abnormalities Noted: No Temperature: No Abnormality Dry / Scaly: Yes Treatment Notes Wound #2 (Lower Leg) Wound Laterality: Right, Lateral Cleanser Soap and Water Discharge Instruction: May shower and wash wound with dial antibacterial soap and water prior to dressing change. Wound Cleanser Discharge Instruction: Cleanse the wound with wound cleanser prior to applying a clean dressing using gauze sponges, not tissue or cotton balls. Peri-Wound Care Triamcinolone 15 (g) Discharge Instruction: Use triamcinolone 15 (g) as directed Sween Lotion (Moisturizing lotion) Discharge Instruction: Apply moisturizing lotion as directed Topical Primary Dressing Endoform 2x2 in Discharge Instruction: Moisten with saline Secondary Dressing Woven Gauze Sponge, Non-Sterile 4x4 in Discharge Instruction: Apply over primary dressing as directed. Secured With 46M Medipore H Soft Cloth Surgical T ape, 4 x 10 (in/yd) Discharge Instruction: Secure with tape as directed. Compression Wrap ThreePress (3 layer compression wrap) Discharge Instruction: Apply three layer compression as directed. Compression Stockings Add-Ons Electronic Signature(s) DIMAGGIO, WINEY ([id number]) [id number] Page 4 of 4 Signed: 07/04/2022 4:08:33 PM By: Sabas Sous By: Adline Peals on 07/04/2022 10:49:16 -------------------------------------------------------------------------------- Vitals Details Patient Name: Date of Service: [name], Anthony [name] 07/04/2022 10:30 A M Medical Record Number: [id number] Patient Account Number: [id number] Date of Birth/Sex: Treating RN: 12-24-1955 (66 y.o. [name] Primary Care Heer Justiss: Charlott Rakes Other Clinician: Referring Jacksen Isip: Treating Schon Zeiders/Extender: Carter Kitten Weeks in Treatment: 26 Vital Signs Time Taken: 10:49 Temperature (F): 98 Height (in): 75 Pulse (bpm): 69 Weight  (lbs): 310 Respiratory Rate (breaths/min): 18 Body Mass Index (BMI): 38.7 Blood Pressure (mmHg): 167/100 Reference Range: 80 - 120 mg / dl Electronic Signature(s) Signed: 07/04/2022 4:08:33 PM By: Adline Peals Entered By: Adline Peals on 07/04/2022 10:49:48

## 2022-07-04 NOTE — Progress Notes (Signed)
KASPER, MUDRICK ([id number]) [id number] Page 1 of 1 Visit Report for 07/04/2022 SuperBill Details Patient Name: Date of Service: Anthony Barnes [name] 07/04/2022 Medical Record Number: [id number] Patient Account Number: [id number] Date of Birth/Sex: Treating RN: September 30, 1955 (66 y.o. Anthony Barnes Primary Care Provider: Hoy Register Other Clinician: Referring Provider: Treating Provider/Extender: Camillo Flaming Weeks in Treatment: 26 Diagnosis Coding ICD-10 Codes Code Description [phone] Non-pressure chronic ulcer of other part of right lower leg with fat layer exposed E11.622 Type 2 diabetes mellitus with other skin ulcer E11.9 Type 2 diabetes mellitus without complications I10 Essential (primary) hypertension I87.2 Venous insufficiency (chronic) (peripheral) Facility Procedures CPT4 Code Description Modifier Quantity [phone] (Facility Use Only) [phone] - APPLY MULTLAY COMPRS LWR RT LEG 1 ICD-10 Diagnosis Description 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osed Electronic Signature(s) Signed: 07/04/2022 3:41:18 PM By: Duanne Guess MD FACS Signed: 07/04/2022 4:08:33 PM By: Samuella Bruin Entered By: Samuella Bruin on 07/04/2022 15:09:18

## 2022-07-13 ENCOUNTER — Encounter (HOSPITAL_BASED_OUTPATIENT_CLINIC_OR_DEPARTMENT_OTHER): Payer: Medicare Other | Admitting: General Surgery

## 2022-07-13 DIAGNOSIS — I872 Venous insufficiency (chronic) (peripheral): Secondary | ICD-10-CM | POA: Diagnosis not present

## 2022-07-13 DIAGNOSIS — E11622 Type 2 diabetes mellitus with other skin ulcer: Secondary | ICD-10-CM | POA: Diagnosis not present

## 2022-07-13 DIAGNOSIS — I10 Essential (primary) hypertension: Secondary | ICD-10-CM | POA: Diagnosis not present

## 2022-07-13 DIAGNOSIS — E119 Type 2 diabetes mellitus without complications: Secondary | ICD-10-CM | POA: Diagnosis not present

## 2022-07-13 DIAGNOSIS — L97812 Non-pressure chronic ulcer of other part of right lower leg with fat layer exposed: Secondary | ICD-10-CM | POA: Diagnosis not present

## 2022-07-14 NOTE — Progress Notes (Signed)
KIROLOS, RAY ([id number]) [id number] Page 1 of 13 Visit Report for 07/13/2022 Chief Complaint Document Details Patient Name: Date of Service: Anthony Barnes [name] 07/13/2022 9:15 A M Medical Record Number: [id number] Patient Account Number: [id number] Date of Birth/Sex: Treating RN: 04-Aug-1956 (66 y.o. M) Primary Care Provider: Charlott Rakes Other Clinician: Referring Provider: Treating Provider/Extender: Carter Kitten Weeks in Treatment: 27 Information Obtained from: Patient Chief Complaint Right LE Ulcer Electronic Signature(s) Signed: 07/13/2022 9:59:45 AM By: Fredirick Maudlin MD FACS Entered By: Fredirick Maudlin on 07/13/2022 09:59:45 -------------------------------------------------------------------------------- HPI Details Patient Name: Date of Service: Anthony Barnes, [name] 07/13/2022 9:15 A M Medical Record Number: [id number] Patient Account Number: [id number] Date of Birth/Sex: Treating RN: 07/22/1956 (66 y.o. M) Primary Care Provider: Charlott Rakes Other Clinician: Referring Provider: Treating Provider/Extender: Carter Kitten Weeks in Treatment: 14 History of Present Illness HPI Description: ADMISSION 03/12/18 This is a 66 year old and it was a type II diabetic reasonably poorly controlled with a recent hemoglobin A1c of 8.2. He was tending to his lawn on 01/15/18 when his weed [name] sent a rock up word hitting him in the right anterior leg. He was seen in his primary M.D. office is same time. An x-ray showed no abnormality. He was given a course of cephalexin. Since then he's been applying peroxide and topical antibiotics to the wound. He does not have a history of chronic wounds however he does have chronic edema in the lower legs. He is complaining of pain in the right leg wound but no real history of claudication. Past medical history includes type 2 diabetes, hypertension, morbid  obesity. ABIs in the right leg were noncompressible 03/19/18 on evaluation today patient appears to be tolerating the wrap in general fairly well. He states he's not having that much pain in regard to the wound itself. With that being said he is having some issues with slough buildup on the surface of the wound the Iodoflex does seem to be helpful in that regard. He is still having discomfort he wonders if I can send in a prescription for ibuprofen or something of like to help him out. I do believe that the prox end may be of benefit for him. 03/25/18 on evaluation today patient appears to be doing very well in regard to his right lateral lower [name]. He's been tolerating the dressing changes without complication that is the wraps. Nonetheless he does continue to have pain he states is been dreading the possibility of having to have debridement again today. His first debridement experience was not optimal. With that being said he does seem to be showing signs of improvement there does appear to be little bit more granulation he does have a lot of slough that really does need to breeding away. 04/04/18;the patient went for arterial studies that were really quite normal. ABI on the right at 1.19 with triphasic waveforms on the left 1.11 with triphasic waveforms. TBI 0.93 on the right and 0.95 on the left. This suggests T should be able to tolerate even 4 layer compression. He is however complaining of a lot of pain with our wraps I'm wondering whether the pain is simply Iodoflex. I changed him to [employer]. I'm still going to try to keep him in 3 layer compression 04/12/18 on evaluation today patient actually appears to be doing rather well in regard to his right lower [name] ulcer. He is making good progress although it is slow still I  do believe that the Santyl is much better than the Iodoflex. The good news is the patient seems to be tolerating the dressing  changes without complication now that we've gotten good of the Iodoflex which really did burn him quite significantly. Otherwise there's no evidence of infection. 04/17/18 on evaluation today patient actually appears to be showing signs of improvement in regard to the ulcer. Unfortunately he's had somewhat of a rough JADAKIS, WIAND ([id number]) [id number] Page 2 of 13 day due to the fact that he found out that one of his friends suddenly and unexpectedly passed today. He states he's not sure he's in the frame of mind to allow me to debride the wound at this point. Nonetheless I do feel like he is showing signs of the wound getting better little by little each week. 04/24/18 on evaluation today patient's ulcer actually appears to be showing some signs of improvement albeit slow. He has been tolerating the dressing changes without complication except for the wrap which she states was so tight that he had to remove it it was causing a lot of discomfort. Fortunately there is no evidence of infection at this point. He states he only wore the wrap until about the time he got home last week and that he had to take it off. Nonetheless he does not have any other compression stockings, Juxta-Lite wraps, or anything otherwise to really help at this point as far as compression is concerned. 05/01/18 on evaluation today patient actually appears to be doing fairly well in regard to his lower extremity ulcer. He has been tolerating the dressing changes currently without complication. The wrap does seem to be helping as far as the fluid management is concerned. Wound bed itself actually show signs of good improvement although there is some Slough noted there's not as much as previous and he actually has fairly decent granulation noted as well. Overall I'm pleased with the progress of to this point. The patient would prefer not to have any debridement today he states he's actually not feeling  too well in general and he really does not want any additional discomfort typically debridement is fairly uncomfortable for him. 05/08/18 on evaluation today patient actually appears to be doing a little better in regard to his lower extremity ulcer. He has been tolerating the dressing changes without complication. With that being said I'm actually quite pleased with the fact the wound is not appear to be infected and in general has made a little progress. With that being said I do believe we need to perform some debridement to clean away the necrotic tissue on the surface of the wound. 05/15/18 on evaluation today patient actually appears to be doing a little better in regard to his wound. Fortunately there is some improvement noted fortunately there's also no significant evidence of infection at this point in time. He does have continued pain that really nothing different than previous he did get his Juxta-Lite wrap. 05/29/18 on evaluation today patient actually appears to be doing somewhat better in regard to his wound currently. He's been tolerating the dressing changes without complication. With that being said he has been performing the [facility] Dressing changes every day at home. I'm pretty sure that we told him every other day last week or when I saw him rather two weeks ago. With that being said obviously did not hurt to do it daily just that obviously is gonna run out of supplies sooner and that could get him into trouble  as far as his insurance is concerned. Nonetheless he at this point still continues to have pain although honestly I don't feel like it's as bad as what it was in the past just based on his reactions at this point. 06/12/18 on evaluation today patient actually appears to be doing better in regard to his lower [name] ulcer. He's been tolerating the dressing changes without complication. Fortunately there does not appear to be any evidence of infection at this time. No  fevers, chills, nausea, or vomiting noted at this time. 06/19/18 evaluation today patient's ulcer on the lower extremity appears to be doing better at this point. he has been tolerating the dressing changes. The patient seems to be somewhat depressed about the progress of his wound although the overall appearance at this time seems to be doing well. In general I'm very pleased with the appearance today he does have some biofilm on the surface of the wound as well as of hyper granular tissue we may want to utilize a little bit of silver nitrate today. 06/27/2018; patient comes into clinic today for a wound on the right lateral calf likely related to chronic venous insufficiency. He has been using medihoney covered with Hydrofera Blue. Wound surface actually looks quite good 07/03/2018 seen today for follow-up and management of lower extremity ulcer right lateral shin. T olerating dressing changes. He has a small amount of biofilm today. Will attempt to remove a portion of the biofilm as he tolerates. He becomes very anxious with wound treatments. He would benefit from layer compression wraps however he refuses compression wraps or anything that smokes his legs. He expressed an inability to tolerate compression wraps. Juxta lite wraps have been ordered. He has been instructed on the appropriate application of the juxta leg wraps. His blood pressure today on visit was 200/120. He denies any blurred vision, chest pain, dizziness, shortness of breath, or difficulty with mobility. Mr. Alling stated that he had a recent visit with his primary care provider. At that time his carvedilol was decreased from 25 mg daily to 12.5 mg daily. Strongly encouraged Mr. Loughlin to seek medical attention today during visit. He declined transport for evaluation of elevated blood pressure. Encouraged him to contact his primary care provider today for medication management. He stated that he would call his primary care doctor  after wound visit today. Also encouraged him that if he experienced any symptoms of blurred vision, chest pain, shortness of breath to immediately seek medical attention. 07/17/18 on evaluation today patient's wound actually does seem to show signs of improvement based on the overall appearance of the wound bed today. There does not appear to show any signs of infection which is good news. There is no overall worsening which is also good news. He still has hyper granular tissue will use in the [employer] followed by [facility] Dressing this point. 07/31/18 on evaluation today patient's wound bed actually show signs of improvement with good epithelialization especially in the upper portion of the wound. He has been tolerating the dressing changes without complication in general. Overall I'm extremely happy with how things stand. No fevers, chills, nausea, or vomiting noted at this time. 08/28/18 on evaluation today patient appears to be doing a little better in regard to his lower extremity ulcer. With that being said it appears to be very hyper granular I think this is directly attributed to the fact that he continues to use the [employer] underneath the [facility] Dressing. Although this helps not to  stick unfortunately also think he's not getting the benefit of the [facility] Dressing particular and subsequently is causing too much moisture buildup hence the hyper granulation. Fortunately there does not appear to be any signs of infection at this time which is good news. 09/03/18 on evaluation today patient appears to be doing well in regard to his lower [name] ulcer. He has been tolerating the dressing changes without complication. Fortunately he has much less hyper granular tissue the noted last week I do think using silver nitrate in changing to using the [facility] Dressing without the mepitel has made a big difference for him this is good news. 09/18/18 on evaluation today patient appears  to be doing excellent in regard to his right lower [name] ulcer. This is actually significantly smaller even compared to the last evaluation. Overall I'm very pleased in this regard. I'm a recommend that we likely repeat the silver nitrate today based on what I'm seeing. 10/02/18 on evaluation today patient appears to be doing excellent in regard to his lower extremity wound on the right. He's been tolerating the dressing changes without complication. Fortunately there's no signs of infection at this time. Overall been very pleased with how things seem to be progressing. No fevers, chills, nausea, or vomiting noted at this time. 10/16/18 on evaluation today patient actually appears to be doing much better in regard to his lower extremity wound on the right. This is shown signs of good improvement and is indeed measuring smaller he is on a excellent track as far as healing is concerned. My hope is this will be healed of the next several weeks. Fortunately there is no evidence of infection at this time. He does seem to be doing everything that I'm recommending for him 10/30/18 on evaluation today patient appears to be doing better in regard to his lower extremity ulcer. He's been tolerating the dressing changes without complication. Fortunately the Hydrofera Blue Dressing to be doing the job. He has made excellent progress. With that being said this is still going somewhat slow but nonetheless is always making good progress at this point. 5/18-Patient was in to be seen for right leg ulcer that appeared after scab above it was removed today. This area had healed completely. 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 The wound has not  healed though it has been nearly 2 months since the injury occurred. He was seen in the emergency department at [facility] on May 9. At that time it was very painful and he described it as a "15 on a scale of 010". He also was found to have 3+ pitting edema to the bilateral lower extremities. He was prescribed a weeks course of Keflex. He is here today because the wound has failed to heal and he has a prior history in our clinic. ABI in clinic today was noncompressible. He did have formal vascular studies performed in 2019 which were normal. The last hemoglobin A1c I have available SABRINA, BUXBAUM ([id number]) [phone].pdf Page 3 of 13 for review was from March 21, 2021 and was elevated at 7.7%. The wound is fairly small and circular located about 3 inches above his right lateral malleolus. It is completely covered with eschar.  No surrounding erythema, no odor, no purulent drainage. 01/11/2022: The wound on his right lateral leg is a little bit smaller today. It has reaccumulated some slough and a small bit of eschar. It remains fairly painful. He has another area on his anterior tibia that he will not let me touch but I am concerned that there is a wound forming underneath the surface. 01/18/2022: Unfortunately, his wound is a little bit bigger today. He continues to accumulate slough on the wound surface. It is still quite tender. 01/25/2022: The patient bumped his wound on the car door which resulted in bleeding. He was seen at urgent care where it was redressed. Unfortunately, the wound is bigger again today. There is accumulated slough on the surface. Urgent care gave him some tramadol, apparently and he took this prior to his visit so he could tolerate debridement better. 02/02/2022: The wound is a little bit smaller today. The surface, however, has accumulated a fairly thick layer of slough. The periwound skin is intact and there is no obvious sign of  infection. 02/07/2022: The wound is a bit larger today. It has thick slough accumulation. The periwound skin is intact and there is no obvious erythema, induration, nor any odor. 02/16/2022: I took a culture last week due to the appearance of the wound and the patient's degree of pain. This grew out methicillin sensitive Staph aureus. Augmentin was prescribed. The patient states that the pain is markedly improved today. The wound also looks much better. It is smaller with less slough buildup and there is good granulation tissue emerging. 02/21/2022: The patient's pain is quite improved from prior visits. The wound is about the same size and has some accumulated slough on the surface. The base is a little bit fibrotic but there are some areas of granulation tissue filling in. 02/28/2022: The wound is a little bit narrower on its measurements but slightly longer. There is still some slough accumulation on the surface, but he has minimal pain and there is no concern for ongoing infection. Granulation tissue is emerging. 03/07/2022: During the week, he cut holes in his wrap because it was painful and too tight. As a result, his leg was quite a bit more swollen today and his wound was larger. The wound surface is fairly clean but a little bit dry. Minimal slough accumulation. Granulation tissue present. 03/14/2022: Once again, he did some arts and crafts projects with his wrap. On the other hand, however, his wound does look better. It is smaller today and is beginning to fill in. Minimal slough and a small amount of eschar accumulation. 03/27/2022: His wound measured bigger today, although it does not appear to be so on my impression. The wound continues to fill with granulation tissue. There is a little bit of slough and eschar accumulation. He continues to cut his wrap off of his feet and his edema control at the ankle is particularly poor with 3+ pitting edema. His insurance will not cover any sort of skin  substitute unfortunately. 04/04/2022: The wound is smaller today. It is more superficial with good granulation tissue and just a little bit of slough. He continues to cut away portions of his wrap which results in inadequate edema control. 04/11/2022: His wound measured a couple millimeters larger today. It continues to fill with granulation tissue and there is minimal slough on the surface. 04/19/2022: No significant change in the wound dimensions, but the surface is healthier and less fibrotic. 04/26/2022: The wound is a little bit bigger  again today. There is slough accumulation on the surface. Edema control is inadequate. He has been cutting the foot portion of his wrap off each week. 05/03/2022: The wound is a little smaller this week. There is still a fair amount of slough on the surface. His drainage was a little bit as greenish today, although not the blue-green typically associated with Pseudomonas. No significant odor. The underlying granulation tissue appears healthy. The culture that I took last week grew out methicillin sensitive Staph aureus. 05/10/2022: The wound is again a little bit smaller this week. He still has slough accumulation on the surface. Most of the surface has fairly decent granulation tissue, but there is still a fibrotic portion in the center near the caudal aspect of the wound. He did not pick up the Augmentin that I prescribed for his MSSA infection. He says he will get it today. 05/19/2022: His wound is smaller by half a centimeter today. There is a little bit of slough buildup, but less so than at previous visits. He has not been taking his Augmentin properly. He has been splitting the tablets in half and taking them on an irregular schedule. He says this is because it has been upsetting his stomach. 05/25/2022: His wound is a little bit smaller again this week. Very minimal slough accumulation. The wound is drier than I would like to see. He completed his course of  Augmentin. 10/19; patient has a wound on the right lateral lower leg in the setting of obvious chronic venous insufficiency. He has had a previous wound on the anterior leg that we dealt with several years ago. He has been using Hydrofera Blue. Kerlix and Coban 06/08/2022: The wound is smaller again this week. There is some light slough accumulation. It remains a little dry. 06/16/2022: Although the wound dimensions were the same this week, on visual inspection it appears smaller. It is clean, but still dry despite the use of hydrogel. 06/22/2022: His wound responded very well to endoform. It is smaller, moisture balance is excellent, and the surface has lovely granulation tissue. 06/29/2022: His wound is smaller again this week. The granulation tissue is a little bit hypertrophic. No concern for infection. 07/13/2022: His wound continues to improve. The open area is much smaller, but there is still some hypertrophic granulation tissue present. Electronic Signature(s) Signed: 07/13/2022 10:00:16 AM By: Duanne Guess MD FACS Entered By: Duanne Guess on 07/13/2022 10:00:16 Arnetha Massy ([id number]) [id number] Page 4 of 13 -------------------------------------------------------------------------------- Chemical Cauterization Details Patient Name: Date of Service: Anthony Barnes [name] 07/13/2022 9:15 A M Medical Record Number: [id number] Patient Account Number: [id number] Date of Birth/Sex: Treating RN: [birth year] (66 y.o. [name] Primary Care Provider: Hoy Register Other Clinician: Referring Provider: Treating Provider/Extender: Camillo Flaming Weeks in Treatment: 27 Procedure Performed for: Wound #2 Right,Lateral Lower Leg Performed By: Physician Duanne Guess, MD Post Procedure Diagnosis Same as Pre-procedure Notes scribed for Dr. Lady Gary by Samuella Bruin, RN Electronic Signature(s) Signed: 07/13/2022  12:45:53 PM By: Duanne Guess MD FACS Signed: 07/13/2022 5:21:53 PM By: Gelene Mink By: Samuella Bruin on 07/13/2022 09:47:24 -------------------------------------------------------------------------------- Physical Exam Details Patient Name: Date of Service: Anthony Barnes, [name] 07/13/2022 9:15 A M Medical Record Number: [id number] Patient Account Number: [id number] Date of Birth/Sex: Treating RN: [birth year] (66 y.o. M) Primary Care Provider: Hoy Register Other Clinician: Referring Provider: Treating Provider/Extender: Camillo Flaming Weeks in Treatment: 71 Constitutional He is hypertensive, but asymptomatic.. Bradycardic, asymptomatic.. . . No acute  distress. Respiratory Normal work of breathing on room air. Notes 07/13/2022: His wound continues to improve. The open area is much smaller, but there is still some hypertrophic granulation tissue present. Electronic Signature(s) Signed: 07/13/2022 10:00:49 AM By: Fredirick Maudlin MD FACS Entered By: Fredirick Maudlin on 07/13/2022 10:00:49 -------------------------------------------------------------------------------- Physician Orders Details Patient Name: Date of Service: Anthony Barnes, [name] 07/13/2022 9:15 A M Medical Record Number: [id number] Patient Account Number: [id number] Date of Birth/Sex: Treating RN: [birth year] (66 y.o. [name] Primary Care Provider: Charlott Rakes Other Clinician: TYLYN, BLAUSTEIN ([id number]) [id number] Page 5 of 13 Referring Provider: Treating Provider/Extender: Carter Kitten Weeks in Treatment: 78 Verbal / Phone Orders: No Diagnosis Coding ICD-10 Coding Code Description [phone] Non-pressure chronic ulcer of other part of right lower leg with fat layer exposed E11.622 Type 2 diabetes mellitus with other skin ulcer E11.9 Type 2 diabetes mellitus without complications [id number] Essential (primary)  hypertension I87.2 Venous insufficiency (chronic) (peripheral) Follow-up Appointments ppointment in 1 week. - Dr. Celine Ahr - room 2 Return A Anesthetic Wound #2 Right,Lateral Lower Leg (In clinic) Topical Lidocaine 4% applied to wound bed Bathing/ Shower/ Hygiene May shower with protection but do not get wound dressing(s) wet. - may purchase a cast protector from [employer] or [facility] Edema Control - Lymphedema / SCD / Other Elevate legs to the level of the heart or above for 30 minutes daily and/or when sitting, a frequency of: Avoid standing for long periods of time. Patient to wear own compression stockings every day. Exercise regularly Moisturize legs daily. Compression stocking or Garment 20-30 mm/Hg pressure to: - to L left until R leg is healed Wound Treatment Wound #2 - Lower Leg Wound Laterality: Right, Lateral Cleanser: Soap and Water 1 x Per Week/30 Days Discharge Instructions: May shower and wash wound with dial antibacterial soap and water prior to dressing change. Cleanser: Wound Cleanser 1 x Per Week/30 Days Discharge Instructions: Cleanse the wound with wound cleanser prior to applying a clean dressing using gauze sponges, not tissue or cotton balls. Peri-Wound Care: Triamcinolone 15 (g) 1 x Per Week/30 Days Discharge Instructions: Use triamcinolone 15 (g) as directed Peri-Wound Care: Sween Lotion (Moisturizing lotion) 1 x Per Week/30 Days Discharge Instructions: Apply moisturizing lotion as directed Prim Dressing: Endoform 2x2 in 1 x Per Week/30 Days ary Discharge Instructions: Moisten with saline Secondary Dressing: Woven Gauze Sponge, Non-Sterile 4x4 in 1 x Per Week/30 Days Discharge Instructions: Apply over primary dressing as directed. Secured With: 41M Medipore H Soft Cloth Surgical T ape, 4 x 10 (in/yd) 1 x Per Week/30 Days Discharge Instructions: Secure with tape as directed. Compression Wrap: ThreePress (3 layer compression wrap) 1 x Per Week/30 Days Discharge  Instructions: Apply three layer compression as directed. Patient Medications llergies: Shellfish Containing Products, codeine A Notifications Medication Indication Start End 07/13/2022 lidocaine DOSE topical 4 % cream - cream topical Electronic Signature(s) Signed: 07/13/2022 12:45:53 PM By: Fredirick Maudlin MD FACS Entered By: Fredirick Maudlin on 07/13/2022 10:01:05 [name] ([id number]) [id number] Page 6 of 13 -------------------------------------------------------------------------------- Problem List Details Patient Name: Date of Service: Anthony Barnes [name] 07/13/2022 9:15 A M Medical Record Number: [id number] Patient Account Number: [id number] Date of Birth/Sex: Treating RN: 08/08/1956 (66 y.o. M) Primary Care Provider: Charlott Rakes Other Clinician: Referring Provider: Treating Provider/Extender: Carter Kitten Weeks in Treatment: 27 Active Problems ICD-10 Encounter Code Description Active Date MDM Diagnosis L97.812 Non-pressure chronic ulcer of other part of right lower leg with fat layer 01/02/2022  No Yes exposed E11.622 Type 2 diabetes mellitus with other skin ulcer 01/02/2022 No Yes E11.9 Type 2 diabetes mellitus without complications [id number] No Yes I10 Essential (primary) hypertension 01/02/2022 No Yes I87.2 Venous insufficiency (chronic) (peripheral) 01/02/2022 No Yes Inactive Problems Resolved Problems Electronic Signature(s) Signed: 07/13/2022 9:57:31 AM By: Fredirick Maudlin MD FACS Entered By: Fredirick Maudlin on 07/13/2022 09:57:31 -------------------------------------------------------------------------------- Progress Note Details Patient Name: Date of Service: Anthony Barnes, [name] 07/13/2022 9:15 A M Medical Record Number: [id number] Patient Account Number: [id number] Date of Birth/Sex: Treating RN: Apr 05, 1956 (66 y.o. M) Primary Care Provider: Charlott Rakes Other Clinician: Referring  Provider: Treating Provider/Extender: Carter Kitten Weeks in Treatment: 61 Subjective Chief Complaint Information obtained from Patient Right LE Ulcer History of Present Illness (HPI) MACALISTER, SIRMAN ([id number]) [id number] Page 7 of 13 ADMISSION 03/12/18 This is a 66 year old and it was a type II diabetic reasonably poorly controlled with a recent hemoglobin A1c of 8.2. He was tending to his lawn on 01/15/18 when his weed [name] sent a rock up word hitting him in the right anterior leg. He was seen in his primary M.D. office is same time. An x-ray showed no abnormality. He was given a course of cephalexin. Since then he's been applying peroxide and topical antibiotics to the wound. He does not have a history of chronic wounds however he does have chronic edema in the lower legs. He is complaining of pain in the right leg wound but no real history of claudication. Past medical history includes type 2 diabetes, hypertension, morbid obesity. ABIs in the right leg were noncompressible 03/19/18 on evaluation today patient appears to be tolerating the wrap in general fairly well. He states he's not having that much pain in regard to the wound itself. With that being said he is having some issues with slough buildup on the surface of the wound the Iodoflex does seem to be helpful in that regard. He is still having discomfort he wonders if I can send in a prescription for ibuprofen or something of like to help him out. I do believe that the prox end may be of benefit for him. 03/25/18 on evaluation today patient appears to be doing very well in regard to his right lateral lower [name]. He's been tolerating the dressing changes without complication that is the wraps. Nonetheless he does continue to have pain he states is been dreading the possibility of having to have debridement again today. His first debridement experience was not  optimal. With that being said he does seem to be showing signs of improvement there does appear to be little bit more granulation he does have a lot of slough that really does need to breeding away. 04/04/18;the patient went for arterial studies that were really quite normal. ABI on the right at 1.19 with triphasic waveforms on the left 1.11 with triphasic waveforms. TBI 0.93 on the right and 0.95 on the left. This suggests T should be able to tolerate even 4 layer compression. He is however complaining of a lot of pain with our wraps I'm wondering whether the pain is simply Iodoflex. I changed him to [employer]. I'm still going to try to keep him in 3 layer compression 04/12/18 on evaluation today patient actually appears to be doing rather well in regard to his right lower [name] ulcer. He is making good progress although it is slow still I do believe that the Santyl is much better than the Iodoflex. The  good news is the patient seems to be tolerating the dressing changes without complication now that we've gotten good of the Iodoflex which really did burn him quite significantly. Otherwise there's no evidence of infection. 04/17/18 on evaluation today patient actually appears to be showing signs of improvement in regard to the ulcer. Unfortunately he's had somewhat of a rough day due to the fact that he found out that one of his friends suddenly and unexpectedly passed today. He states he's not sure he's in the frame of mind to allow me to debride the wound at this point. Nonetheless I do feel like he is showing signs of the wound getting better little by little each week. 04/24/18 on evaluation today patient's ulcer actually appears to be showing some signs of improvement albeit slow. He has been tolerating the dressing changes without complication except for the wrap which she states was so tight that he had to remove it it was causing a lot of discomfort. Fortunately there is  no evidence of infection at this point. He states he only wore the wrap until about the time he got home last week and that he had to take it off. Nonetheless he does not have any other compression stockings, Juxta-Lite wraps, or anything otherwise to really help at this point as far as compression is concerned. 05/01/18 on evaluation today patient actually appears to be doing fairly well in regard to his lower extremity ulcer. He has been tolerating the dressing changes currently without complication. The wrap does seem to be helping as far as the fluid management is concerned. Wound bed itself actually show signs of good improvement although there is some Slough noted there's not as much as previous and he actually has fairly decent granulation noted as well. Overall I'm pleased with the progress of to this point. The patient would prefer not to have any debridement today he states he's actually not feeling too well in general and he really does not want any additional discomfort typically debridement is fairly uncomfortable for him. 05/08/18 on evaluation today patient actually appears to be doing a little better in regard to his lower extremity ulcer. He has been tolerating the dressing changes without complication. With that being said I'm actually quite pleased with the fact the wound is not appear to be infected and in general has made a little progress. With that being said I do believe we need to perform some debridement to clean away the necrotic tissue on the surface of the wound. 05/15/18 on evaluation today patient actually appears to be doing a little better in regard to his wound. Fortunately there is some improvement noted fortunately there's also no significant evidence of infection at this point in time. He does have continued pain that really nothing different than previous he did get his Juxta-Lite wrap. 05/29/18 on evaluation today patient actually appears to be doing somewhat better  in regard to his wound currently. He's been tolerating the dressing changes without complication. With that being said he has been performing the [facility] Dressing changes every day at home. I'm pretty sure that we told him every other day last week or when I saw him rather two weeks ago. With that being said obviously did not hurt to do it daily just that obviously is gonna run out of supplies sooner and that could get him into trouble as far as his insurance is concerned. Nonetheless he at this point still continues to have pain although honestly I don't  feel like it's as bad as what it was in the past just based on his reactions at this point. 06/12/18 on evaluation today patient actually appears to be doing better in regard to his lower [name] ulcer. He's been tolerating the dressing changes without complication. Fortunately there does not appear to be any evidence of infection at this time. No fevers, chills, nausea, or vomiting noted at this time. 06/19/18 evaluation today patient's ulcer on the lower extremity appears to be doing better at this point. he has been tolerating the dressing changes. The patient seems to be somewhat depressed about the progress of his wound although the overall appearance at this time seems to be doing well. In general I'm very pleased with the appearance today he does have some biofilm on the surface of the wound as well as of hyper granular tissue we may want to utilize a little bit of silver nitrate today. 06/27/2018; patient comes into clinic today for a wound on the right lateral calf likely related to chronic venous insufficiency. He has been using medihoney covered with Hydrofera Blue. Wound surface actually looks quite good 07/03/2018 seen today for follow-up and management of lower extremity ulcer right lateral shin. T olerating dressing changes. He has a small amount of biofilm today. Will attempt to remove a portion of the biofilm as he tolerates. He  becomes very anxious with wound treatments. He would benefit from layer compression wraps however he refuses compression wraps or anything that smokes his legs. He expressed an inability to tolerate compression wraps. Juxta lite wraps have been ordered. He has been instructed on the appropriate application of the juxta leg wraps. His blood pressure today on visit was 200/120. He denies any blurred vision, chest pain, dizziness, shortness of breath, or difficulty with mobility. Mr. Manzano stated that he had a recent visit with his primary care provider. At that time his carvedilol was decreased from 25 mg daily to 12.5 mg daily. Strongly encouraged Mr. Dios to seek medical attention today during visit. He declined transport for evaluation of elevated blood pressure. Encouraged him to contact his primary care provider today for medication management. He stated that he would call his primary care doctor after wound visit today. Also encouraged him that if he experienced any symptoms of blurred vision, chest pain, shortness of breath to immediately seek medical attention. 07/17/18 on evaluation today patient's wound actually does seem to show signs of improvement based on the overall appearance of the wound bed today. There does not appear to show any signs of infection which is good news. There is no overall worsening which is also good news. He still has hyper granular tissue will use in the [employer] followed by [facility] Dressing this point. 07/31/18 on evaluation today patient's wound bed actually show signs of improvement with good epithelialization especially in the upper portion of the wound. He has been tolerating the dressing changes without complication in general. Overall I'm extremely happy with how things stand. No fevers, chills, nausea, or vomiting noted at this time. 08/28/18 on evaluation today patient appears to be doing a little better in regard to his lower extremity ulcer. With  that being said it appears to be very hyper granular I think this is directly attributed to the fact that he continues to use the [employer] underneath the [facility] Dressing. Although this helps not to stick unfortunately also think he's not getting the benefit of the [facility] Dressing particular and subsequently is causing too much  moisture buildup hence the hyper granulation. Fortunately there does not appear to be any signs of infection at this time which is good news. BUDDIE, SULCER ([id number]) [id number] Page 8 of 13 09/03/18 on evaluation today patient appears to be doing well in regard to his lower [name] ulcer. He has been tolerating the dressing changes without complication. Fortunately he has much less hyper granular tissue the noted last week I do think using silver nitrate in changing to using the [facility] Dressing without the mepitel has made a big difference for him this is good news. 09/18/18 on evaluation today patient appears to be doing excellent in regard to his right lower [name] ulcer. This is actually significantly smaller even compared to the last evaluation. Overall I'm very pleased in this regard. I'm a recommend that we likely repeat the silver nitrate today based on what I'm seeing. 10/02/18 on evaluation today patient appears to be doing excellent in regard to his lower extremity wound on the right. He's been tolerating the dressing changes without complication. Fortunately there's no signs of infection at this time. Overall been very pleased with how things seem to be progressing. No fevers, chills, nausea, or vomiting noted at this time. 10/16/18 on evaluation today patient actually appears to be doing much better in regard to his lower extremity wound on the right. This is shown signs of good improvement and is indeed measuring smaller he is on a excellent track as far as healing is concerned. My hope is this will be healed of  the next several weeks. Fortunately there is no evidence of infection at this time. He does seem to be doing everything that I'm recommending for him 10/30/18 on evaluation today patient appears to be doing better in regard to his lower extremity ulcer. He's been tolerating the dressing changes without complication. Fortunately the Hydrofera Blue Dressing to be doing the job. He has made excellent progress. With that being said this is still going somewhat slow but nonetheless is always making good progress at this point. 5/18-Patient was in to be seen for right leg ulcer that appeared after scab above it was removed today. This area had healed completely. 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 The wound has not healed though it has been nearly 2 months since the injury occurred. He was seen in the emergency department at [facility] on May 9. At that time it was very painful and he described it as a "15 on a scale of 0oo10". He also was found to have 3+ pitting edema to the bilateral lower extremities. He was prescribed a weeks course of Keflex. He is here today because the wound has failed to heal and he has a prior history in our clinic. ABI in clinic today was noncompressible. He did have formal vascular studies performed in 2019 which were normal. The last hemoglobin A1c I have available for review was from March 21, 2021 and was elevated at 7.7%. The wound is fairly small and circular located about 3 inches above his right lateral malleolus. It is completely covered with eschar. No surrounding erythema, no odor, no purulent drainage. 01/11/2022: The wound on his right lateral leg is a little bit smaller today.  It has  reaccumulated some slough and a small bit of eschar. It remains fairly painful. He has another area on his anterior tibia that he will not let me touch but I am concerned that there is a wound forming underneath the surface. 01/18/2022: Unfortunately, his wound is a little bit bigger today. He continues to accumulate slough on the wound surface. It is still quite tender. 01/25/2022: The patient bumped his wound on the car door which resulted in bleeding. He was seen at urgent care where it was redressed. Unfortunately, the wound is bigger again today. There is accumulated slough on the surface. Urgent care gave him some tramadol, apparently and he took this prior to his visit so he could tolerate debridement better. 02/02/2022: The wound is a little bit smaller today. The surface, however, has accumulated a fairly thick layer of slough. The periwound skin is intact and there is no obvious sign of infection. 02/07/2022: The wound is a bit larger today. It has thick slough accumulation. The periwound skin is intact and there is no obvious erythema, induration, nor any odor. 02/16/2022: I took a culture last week due to the appearance of the wound and the patient's degree of pain. This grew out methicillin sensitive Staph aureus. Augmentin was prescribed. The patient states that the pain is markedly improved today. The wound also looks much better. It is smaller with less slough buildup and there is good granulation tissue emerging. 02/21/2022: The patient's pain is quite improved from prior visits. The wound is about the same size and has some accumulated slough on the surface. The base is a little bit fibrotic but there are some areas of granulation tissue filling in. 02/28/2022: The wound is a little bit narrower on its measurements but slightly longer. There is still some slough accumulation on the surface, but he has minimal pain and there is no concern for ongoing infection. Granulation tissue is  emerging. 03/07/2022: During the week, he cut holes in his wrap because it was painful and too tight. As a result, his leg was quite a bit more swollen today and his wound was larger. The wound surface is fairly clean but a little bit dry. Minimal slough accumulation. Granulation tissue present. 03/14/2022: Once again, he did some arts and crafts projects with his wrap. On the other hand, however, his wound does look better. It is smaller today and is beginning to fill in. Minimal slough and a small amount of eschar accumulation. 03/27/2022: His wound measured bigger today, although it does not appear to be so on my impression. The wound continues to fill with granulation tissue. There is a little bit of slough and eschar accumulation. He continues to cut his wrap off of his feet and his edema control at the ankle is particularly poor with 3+ pitting edema. His insurance will not cover any sort of skin substitute unfortunately. 04/04/2022: The wound is smaller today. It is more superficial with good granulation tissue and just a little bit of slough. He continues to cut away portions of his wrap which results in inadequate edema control. 04/11/2022: His wound measured a couple millimeters larger today. It continues to fill with granulation tissue and there is minimal slough on the surface. 04/19/2022: No significant change in the wound dimensions, but the surface is healthier and less fibrotic. 04/26/2022: The wound is a little bit bigger again today. There is slough accumulation on the surface. Edema control is inadequate. He has been cutting the foot portion of his  wrap off each week. 05/03/2022: The wound is a little smaller this week. There is still a fair amount of slough on the surface. His drainage was a little bit as greenish today, although not the blue-green typically associated with Pseudomonas. No significant odor. The underlying granulation tissue appears healthy. The culture that I took last week  grew out methicillin sensitive Staph aureus. 05/10/2022: The wound is again a little bit smaller this week. He still has slough accumulation on the surface. Most of the surface has fairly decent granulation tissue, but there is still a fibrotic portion in the center near the caudal aspect of the wound. He did not pick up the Augmentin that I prescribed for his MSSA infection. He says he will get it today. 05/19/2022: His wound is smaller by half a centimeter today. There is a little bit of slough buildup, but less so than at previous visits. He has not been taking his Augmentin properly. He has been splitting the tablets in half and taking them on an irregular schedule. He says this is because it has been upsetting his stomach. EKANSH, DITTER ([id number]) [id number] Page 9 of 13 05/25/2022: His wound is a little bit smaller again this week. Very minimal slough accumulation. The wound is drier than I would like to see. He completed his course of Augmentin. 10/19; patient has a wound on the right lateral lower leg in the setting of obvious chronic venous insufficiency. He has had a previous wound on the anterior leg that we dealt with several years ago. He has been using Hydrofera Blue. Kerlix and Coban 06/08/2022: The wound is smaller again this week. There is some light slough accumulation. It remains a little dry. 06/16/2022: Although the wound dimensions were the same this week, on visual inspection it appears smaller. It is clean, but still dry despite the use of hydrogel. 06/22/2022: His wound responded very well to endoform. It is smaller, moisture balance is excellent, and the surface has lovely granulation tissue. 06/29/2022: His wound is smaller again this week. The granulation tissue is a little bit hypertrophic. No concern for infection. 07/13/2022: His wound continues to improve. The open area is much smaller, but there is still some hypertrophic granulation  tissue present. Patient History Information obtained from Patient. Family History Cancer - Mother, Diabetes - Mother,Father, Hypertension - Mother,Father, Kidney Disease - Siblings, Stroke - Father, No family history of Heart Disease, Hereditary Spherocytosis, Lung Disease, Seizures, Thyroid Problems, Tuberculosis. Social History Former smoker - ended on 08/14/1990, Marital Status - Married, Alcohol Use - Moderate, Drug Use - No History, Caffeine Use - Daily. Medical History Eyes Denies history of Cataracts, Glaucoma, Optic Neuritis Ear/Nose/Mouth/Throat Denies history of Chronic sinus problems/congestion, Middle ear problems Hematologic/Lymphatic Denies history of Anemia, Hemophilia, Human Immunodeficiency Virus, Lymphedema, Sickle Cell Disease Respiratory Denies history of Aspiration, Asthma, Chronic Obstructive Pulmonary Disease (COPD), Pneumothorax, Sleep Apnea, Tuberculosis Cardiovascular Patient has history of Hypertension Denies history of Angina, Arrhythmia, Congestive Heart Failure, Coronary Artery Disease, Deep Vein Thrombosis, Hypotension, Myocardial Infarction, Peripheral Arterial Disease, Peripheral Venous Disease, Phlebitis, Vasculitis Gastrointestinal Denies history of Cirrhosis , Colitis, Crohnoos, Hepatitis A, Hepatitis B, Hepatitis C Endocrine Patient has history of Type II Diabetes Genitourinary Denies history of End Stage Renal Disease Immunological Denies history of Lupus Erythematosus, Raynaudoos, Scleroderma Integumentary (Skin) Denies history of History of Burn Musculoskeletal Denies history of Gout, Rheumatoid Arthritis, Osteoarthritis, Osteomyelitis Neurologic Denies history of Dementia, Neuropathy, Quadriplegia, Paraplegia, Seizure Disorder Oncologic Denies history of Received Chemotherapy, Received Radiation Psychiatric  Denies history of Anorexia/bulimia, Confinement Anxiety Hospitalization/Surgery History - esophagogastroduodenoscopy. - brain  aneurysm surgery. Medical A Surgical History Notes nd Constitutional Symptoms (General Health) obesity Gastrointestinal diverticulitis Neurologic neuropathy Objective Constitutional He is hypertensive, but asymptomatic.. Bradycardic, asymptomatic.Marland Kitchen No acute distress. Vitals Time Taken: 9:31 AM, Height: 75 in, Weight: 310 lbs, BMI: 38.7, Temperature: 97.9 F, Pulse: 55 bpm, Respiratory Rate: 18 breaths/min, Blood Pressure: 171/92 mmHg. Respiratory Normal work of breathing on room air. BETO, FOUGEROUSSE ([id number]) [id number] Page 10 of 13 General Notes: 07/13/2022: His wound continues to improve. The open area is much smaller, but there is still some hypertrophic granulation tissue present. Integumentary (Hair, Skin) Wound #2 status is Open. Original cause of wound was Trauma. The date acquired was: 12/03/2021. The wound has been in treatment 27 weeks. The wound is located on the Right,Lateral Lower Leg. The wound measures 2.6cm length x 0.9cm width x 0.1cm depth; 1.838cm^2 area and 0.184cm^3 volume. There is Fat Layer (Subcutaneous Tissue) exposed. There is no tunneling or undermining noted. There is a medium amount of serosanguineous drainage noted. The wound margin is distinct with the outline attached to the wound base. There is large (67-100%) red, hyper - granulation within the wound bed. There is a small (1-33%) amount of necrotic tissue within the wound bed including Adherent Slough. The periwound skin appearance had no abnormalities noted for texture. The periwound skin appearance had no abnormalities noted for color. The periwound skin appearance exhibited: Dry/Scaly. Periwound temperature was noted as No Abnormality. Assessment Active Problems ICD-10 Non-pressure chronic ulcer of other part of right lower leg with fat layer exposed Type 2 diabetes mellitus with other skin ulcer Type 2 diabetes mellitus without complications Essential (primary)  hypertension Venous insufficiency (chronic) (peripheral) Procedures Wound #2 Pre-procedure diagnosis of Wound #2 is a Venous Leg Ulcer located on the Right,Lateral Lower Leg . There was a Three Layer Compression Therapy Procedure by Adline Peals, RN. Post procedure Diagnosis Wound #2: Same as Pre-Procedure Pre-procedure diagnosis of Wound #2 is a Venous Leg Ulcer located on the Right,Lateral Lower Leg . An Chemical Cauterization procedure was performed by Fredirick Maudlin, MD. Post procedure Diagnosis Wound #2: Same as Pre-Procedure Notes: scribed for Dr. Celine Ahr by Adline Peals, RN Plan Follow-up Appointments: Return Appointment in 1 week. - Dr. Celine Ahr - room 2 Anesthetic: Wound #2 Right,Lateral Lower Leg: (In clinic) Topical Lidocaine 4% applied to wound bed Bathing/ Shower/ Hygiene: May shower with protection but do not get wound dressing(s) wet. - may purchase a cast protector from [facility] or [facility] Edema Control - Lymphedema / SCD / Other: Elevate legs to the level of the heart or above for 30 minutes daily and/or when sitting, a frequency of: Avoid standing for long periods of time. Patient to wear own compression stockings every day. Exercise regularly Moisturize legs daily. Compression stocking or Garment 20-30 mm/Hg pressure to: - to L left until R leg is healed The following medication(s) was prescribed: lidocaine topical 4 % cream cream topical was prescribed at facility WOUND #2: - Lower Leg Wound Laterality: Right, Lateral Cleanser: Soap and Water 1 x Per Week/30 Days Discharge Instructions: May shower and wash wound with dial antibacterial soap and water prior to dressing change. Cleanser: Wound Cleanser 1 x Per Week/30 Days Discharge Instructions: Cleanse the wound with wound cleanser prior to applying a clean dressing using gauze sponges, not tissue or cotton balls. Peri-Wound Care: Triamcinolone 15 (g) 1 x Per Week/30 Days Discharge Instructions: Use  triamcinolone 15 (g) as directed  Peri-Wound Care: Sween Lotion (Moisturizing lotion) 1 x Per Week/30 Days Discharge Instructions: Apply moisturizing lotion as directed Prim Dressing: Endoform 2x2 in 1 x Per Week/30 Days ary Discharge Instructions: Moisten with saline Secondary Dressing: Woven Gauze Sponge, Non-Sterile 4x4 in 1 x Per Week/30 Days Discharge Instructions: Apply over primary dressing as directed. Secured With: 24M Medipore H Soft Cloth Surgical T ape, 4 x 10 (in/yd) 1 x Per Week/30 Days Discharge Instructions: Secure with tape as directed. Com pression Wrap: ThreePress (3 layer compression wrap) 1 x Per Week/30 Days Discharge Instructions: Apply three layer compression as directed. 07/13/2022: His wound continues to improve. The open area is much smaller, but there is still some hypertrophic granulation tissue present. KAS, SHYNE ([id number]) [id number] Page 11 of 13 I chemically cauterized the hypertrophic granulation tissue with silver nitrate. We will continue endoform and 3 layer compression. Follow-up in 1 week. Electronic Signature(s) Signed: 07/13/2022 2:18:41 PM By: Fredirick Maudlin MD FACS Signed: 07/13/2022 5:21:53 PM By: Adline Peals Previous Signature: 07/13/2022 10:01:28 AM Version By: Fredirick Maudlin MD FACS Entered By: Adline Peals on 07/13/2022 13:50:57 -------------------------------------------------------------------------------- HxROS Details Patient Name: Date of Service: Anthony Barnes, [name] 07/13/2022 9:15 A M Medical Record Number: [id number] Patient Account Number: [id number] Date of Birth/Sex: Treating RN: April 21, 1956 (66 y.o. M) Primary Care Provider: Charlott Rakes Other Clinician: Referring Provider: Treating Provider/Extender: Carter Kitten Weeks in Treatment: 27 Information Obtained From Patient Constitutional Symptoms (General Health) Medical History: Past Medical History  Notes: obesity Eyes Medical History: Negative for: Cataracts; Glaucoma; Optic Neuritis Ear/Nose/Mouth/Throat Medical History: Negative for: Chronic sinus problems/congestion; Middle ear problems Hematologic/Lymphatic Medical History: Negative for: Anemia; Hemophilia; Human Immunodeficiency Virus; Lymphedema; Sickle Cell Disease Respiratory Medical History: Negative for: Aspiration; Asthma; Chronic Obstructive Pulmonary Disease (COPD); Pneumothorax; Sleep Apnea; Tuberculosis Cardiovascular Medical History: Positive for: Hypertension Negative for: Angina; Arrhythmia; Congestive Heart Failure; Coronary Artery Disease; Deep Vein Thrombosis; Hypotension; Myocardial Infarction; Peripheral Arterial Disease; Peripheral Venous Disease; Phlebitis; Vasculitis Gastrointestinal Medical History: Negative for: Cirrhosis ; Colitis; Crohns; Hepatitis A; Hepatitis B; Hepatitis C Past Medical History Notes: diverticulitis Endocrine Medical History: Positive for: Type II Diabetes Time with diabetes: 8 years Treated with: Oral agents Blood sugar tested every day: No Anthony, Barnes ([id number]) [id number] Page 12 of 13 Genitourinary Medical History: Negative for: End Stage Renal Disease Immunological Medical History: Negative for: Lupus Erythematosus; Raynauds; Scleroderma Integumentary (Skin) Medical History: Negative for: History of Burn Musculoskeletal Medical History: Negative for: Gout; Rheumatoid Arthritis; Osteoarthritis; Osteomyelitis Neurologic Medical History: Negative for: Dementia; Neuropathy; Quadriplegia; Paraplegia; Seizure Disorder Past Medical History Notes: neuropathy Oncologic Medical History: Negative for: Received Chemotherapy; Received Radiation Psychiatric Medical History: Negative for: Anorexia/bulimia; Confinement Anxiety Immunizations Pneumococcal Vaccine: Received Pneumococcal Vaccination: No Immunization Notes: tetanus 2  years ago Implantable Devices None Hospitalization / Surgery History Type of Hospitalization/Surgery esophagogastroduodenoscopy brain aneurysm surgery Family and Social History Cancer: Yes - Mother; Diabetes: Yes - Mother,Father; Heart Disease: No; Hereditary Spherocytosis: No; Hypertension: Yes - Mother,Father; Kidney Disease: Yes - Siblings; Lung Disease: No; Seizures: No; Stroke: Yes - Father; Thyroid Problems: No; Tuberculosis: No; Former smoker - ended on 08/14/1990; Marital Status - Married; Alcohol Use: Moderate; Drug Use: No History; Caffeine Use: Daily; Financial Concerns: No; Food, Clothing or Shelter Needs: No; Support System Lacking: No; Transportation Concerns: No Electronic Signature(s) Signed: 07/13/2022 12:45:53 PM By: Fredirick Maudlin MD FACS Entered By: Fredirick Maudlin on 07/13/2022 10:00:23 -------------------------------------------------------------------------------- SuperBill Details Patient Name: Date of Service: Anthony Barnes, [name] 07/13/2022 Medical Record  Number: [id number] Patient Account Number: [id number] Date of Birth/Sex: Treating RN: 10-03-1955 (66 y.o. DECKLIN, POINTER ([id number]) [id number] Page 13 of 13 Primary Care Provider: Charlott Rakes Other Clinician: Referring Provider: Treating Provider/Extender: Carter Kitten Weeks in Treatment: 27 Diagnosis Coding ICD-10 Codes Code Description [id number] Non-pressure chronic ulcer of other part of right lower leg with fat layer exposed E11.622 Type 2 diabetes mellitus with other skin ulcer E11.9 Type 2 diabetes mellitus without complications [id number] Essential (primary) hypertension I87.2 Venous insufficiency (chronic) (peripheral) Facility Procedures : CPT4 Code: [id number] Description: [id number] - CHEM CAUT GRANULATION TISS ICD-10 Diagnosis Description [id number] Non-pressure chronic ulcer of other part of right lower leg with fat layer expo Modifier:  sed Quantity: 1 Physician Procedures : CPT4 Code Description Modifier [id number] - WC PHYS LEVEL 3 - EST PT ICD-10 Diagnosis Description [id number] Non-pressure chronic ulcer of other part of right lower leg with fat layer exposed E11.622 Type 2 diabetes mellitus with other skin ulcer I87.2  Venous insufficiency (chronic) (peripheral) I10 Essential (primary) hypertension Quantity: 1 : [id number] - WC PHYS CHEM CAUT GRAN TISSUE ICD-10 Diagnosis Description [id number] Non-pressure chronic ulcer of other part of right lower leg with fat layer exposed Quantity: 1 Electronic Signature(s) Signed: 07/13/2022 10:01:53 AM By: Fredirick Maudlin MD FACS Entered By: Fredirick Maudlin on 07/13/2022 10:01:52

## 2022-07-14 NOTE — Progress Notes (Signed)
MAXSON, Barnes ([id number]) [id number] Page 1 of 7 Visit Report for 07/13/2022 Arrival Information Details Patient Name: Date of Service: Anthony Barnes [name] 07/13/2022 9:15 A M Medical Record Number: [id number] Patient Account Number: [id number] Date of Birth/Sex: Treating RN: January 13, 1956 (66 y.o. [name] Primary Care Zabian Swayne: Charlott Rakes Other Clinician: Referring Shams Fill: Treating Darby Shadwick/Extender: Carter Kitten Weeks in Treatment: 27 Visit Information History Since Last Visit Added or deleted any medications: No Patient Arrived: Ambulatory Any new allergies or adverse reactions: No Arrival Time: 09:27 Had a fall or experienced change in No Accompanied By: self activities of daily living that may affect Transfer Assistance: None risk of falls: Patient Identification Verified: Yes Signs or symptoms of abuse/neglect since last visito No Secondary Verification Process Completed: Yes Hospitalized since last visit: No Patient Requires Transmission-Based Precautions: No Implantable device outside of the clinic excluding No Patient Has Alerts: No cellular tissue based products placed in the center since last visit: Has Dressing in Place as Prescribed: Yes Has Compression in Place as Prescribed: Yes Pain Present Now: No Electronic Signature(s) Signed: 07/13/2022 5:21:53 PM By: Adline Peals Entered By: Adline Peals on 07/13/2022 09:31:31 -------------------------------------------------------------------------------- Compression Therapy Details Patient Name: Date of Service: Anthony Barnes, [name] 07/13/2022 9:15 A M Medical Record Number: [id number] Patient Account Number: [id number] Date of Birth/Sex: Treating RN: Mar 22, 1956 (66 y.o. [name] Primary Care Mikesha Migliaccio: Charlott Rakes Other Clinician: Referring Mikella Linsley: Treating Jahlia Omura/Extender: Carter Kitten Weeks  in Treatment: 27 Compression Therapy Performed for Wound Assessment: Wound #2 Right,Lateral Lower Leg Performed By: Clinician Adline Peals, RN Compression Type: Three Layer Post Procedure Diagnosis Same as Pre-procedure Electronic Signature(s) Signed: 07/13/2022 5:21:53 PM By: Adline Peals Entered By: Adline Peals on 07/13/2022 13:50:17 [name] ([id number]) [id number] Page 2 of 7 -------------------------------------------------------------------------------- Encounter Discharge Information Details Patient Name: Date of Service: Anthony Barnes 07/13/2022 9:15 A M Medical Record Number: [id number] Patient Account Number: [id number] Date of Birth/Sex: Treating RN: [birth year] (66 y.o. [name] Primary Care Ledger Heindl: Charlott Rakes Other Clinician: Referring Viren Lebeau: Treating Jaeden Westbay/Extender: Carter Kitten Weeks in Treatment: 41 Encounter Discharge Information Items Discharge Condition: Stable Ambulatory Status: Ambulatory Discharge Destination: Home Transportation: Private Auto Accompanied By: self Schedule Follow-up Appointment: Yes Clinical Summary of Care: Patient Declined Electronic Signature(s) Signed: 07/13/2022 5:21:53 PM By: Adline Peals Entered By: Adline Peals on 07/13/2022 13:51:16 -------------------------------------------------------------------------------- Lower Extremity Assessment Details Patient Name: Date of Service: Anthony Barnes [name] 07/13/2022 9:15 A M Medical Record Number: [id number] Patient Account Number: [id number] Date of Birth/Sex: Treating RN: 1955/09/05 (66 y.o. [name] Primary Care Trejan Buda: Charlott Rakes Other Clinician: Referring Senai Kingsley: Treating Lopaka Karge/Extender: Carter Kitten Weeks in Treatment: 27 Edema Assessment Assessed: [Left: No] [Right: No] [Left: Edema] [Right: :] Calf Left:  Right: Point of Measurement: From Medial Instep 43.8 cm Ankle Left: Right: Point of Measurement: From Medial Instep 25.4 cm Vascular Assessment Pulses: Dorsalis Pedis Palpable: [Right:Yes] Electronic Signature(s) Signed: 07/13/2022 5:21:53 PM By: Adline Peals Entered By: Adline Peals on 07/13/2022 09:36:07 Multi Wound Chart Details -------------------------------------------------------------------------------- [name] ([id number]) [id number] Page 3 of 7 Patient Name: Date of Service: Anthony Barnes [name] 07/13/2022 9:15 A M Medical Record Number: [id number] Patient Account Number: [id number] Date of Birth/Sex: Treating RN: [birth year] (66 y.o. M) Primary Care Marializ Ferrebee: Charlott Rakes Other Clinician: Referring Larnie Heart: Treating Naydeen Speirs/Extender: Carter Kitten Weeks in Treatment: 27 Vital Signs Height(in): 75 Pulse(bpm): 55 Weight(lbs): 310 Blood  Pressure(mmHg): 171/92 Body Mass Index(BMI): 38.7 Temperature(F): 97.9 Respiratory Rate(breaths/min): 18 Wound Assessments Wound Number: 2 N/A N/A Photos: N/A N/A Right, Lateral Lower Leg N/A N/A Wound Location: Trauma N/A N/A Wounding Event: Venous Leg Ulcer N/A N/A Primary Etiology: Hypertension, Type II Diabetes N/A N/A Comorbid History: 12/03/2021 N/A N/A Date Acquired: 70 N/A N/A Weeks of Treatment: Open N/A N/A Wound Status: No N/A N/A Wound Recurrence: 2.6x0.9x0.1 N/A N/A Measurements L x W x D (cm) 1.838 N/A N/A A (cm) : rea 0.184 N/A N/A Volume (cm) : -73.40% N/A N/A % Reduction in A rea: -73.60% N/A N/A % Reduction in Volume: Full Thickness Without Exposed N/A N/A Classification: Support Structures Medium N/A N/A Exudate Amount: Serosanguineous N/A N/A Exudate Type: red, brown N/A N/A Exudate Color: Distinct, outline attached N/A N/A Wound Margin: Large (67-100%) N/A N/A Granulation Amount: Red, Hyper-granulation N/A  N/A Granulation Quality: Small (1-33%) N/A N/A Necrotic Amount: Fat Layer (Subcutaneous Tissue): Yes N/A N/A Exposed Structures: Fascia: No Tendon: No Muscle: No Joint: No Bone: No Medium (34-66%) N/A N/A Epithelialization: No Abnormalities Noted N/A N/A Periwound Skin Texture: Dry/Scaly: Yes N/A N/A Periwound Skin Moisture: No Abnormalities Noted N/A N/A Periwound Skin Color: No Abnormality N/A N/A Temperature: Chemical Cauterization N/A N/A Procedures Performed: Treatment Notes Electronic Signature(s) Signed: 07/13/2022 9:58:11 AM By: Fredirick Maudlin MD FACS Entered By: Fredirick Maudlin on 07/13/2022 09:58:11 -------------------------------------------------------------------------------- Multi-Disciplinary Care Plan Details Patient Name: Date of Service: Anthony Barnes, [place] E. 07/13/2022 9:15 A Marisa Sprinkles ([id number]) [id number] Page 4 of 7 Medical Record Number: [id number] Patient Account Number: [id number] Date of Birth/Sex: Treating RN: 1956-03-22 (66 y.o. [name] Primary Care Terez Freimark: Charlott Rakes Other Clinician: Referring Danniela Mcbrearty: Treating Jazzmin Newbold/Extender: Carter Kitten Weeks in Treatment: 27 Active Inactive Venous Leg Ulcer Nursing Diagnoses: Actual venous Insuffiency (use after diagnosis is confirmed) Knowledge deficit related to disease process and management Goals: Patient will maintain optimal edema control Date Initiated: 01/02/2022 Target Resolution Date: 08/11/2022 Goal Status: Active Patient/caregiver will verbalize understanding of disease process and disease management Date Initiated: 01/02/2022 Date Inactivated: 03/07/2022 Target Resolution Date: 03/09/2022 Goal Status: Met Interventions: Assess peripheral edema status every visit. Compression as ordered Provide education on venous insufficiency Treatment Activities: Non-invasive vascular studies : 01/02/2022 T  ordered outside of clinic : 01/02/2022 est Therapeutic compression applied : 01/02/2022 Notes: Wound/Skin Impairment Nursing Diagnoses: Impaired tissue integrity Knowledge deficit related to ulceration/compromised skin integrity Goals: Patient/caregiver will verbalize understanding of skin care regimen Date Initiated: 01/02/2022 Date Inactivated: 02/02/2022 Target Resolution Date: 02/03/2022 Goal Status: Met Ulcer/skin breakdown will have a volume reduction of 30% by week 4 Date Initiated: 01/02/2022 Date Inactivated: 06/08/2022 Target Resolution Date: 06/09/2022 Goal Status: Met Ulcer/skin breakdown will have a volume reduction of 50% by week 8 Date Initiated: 06/08/2022 Target Resolution Date: 08/11/2022 Goal Status: Active Interventions: Assess ulceration(s) every visit Provide education on ulcer and skin care Treatment Activities: Skin care regimen initiated : 01/02/2022 Topical wound management initiated : 01/02/2022 Notes: Electronic Signature(s) Signed: 07/13/2022 5:21:53 PM By: Adline Peals Entered By: Adline Peals on 07/13/2022 13:50:29 [name] ([id number]) [id number] Page 5 of 7 -------------------------------------------------------------------------------- Pain Assessment Details Patient Name: Date of Service: Anthony Barnes [name] 07/13/2022 9:15 A M Medical Record Number: [id number] Patient Account Number: [id number] Date of Birth/Sex: Treating RN: October 25, 1955 (66 y.o. [name] Primary Care Akaiya Touchette: Charlott Rakes Other Clinician: Referring Ravinder Hofland: Treating Hayat Warbington/Extender: Carter Kitten Weeks in Treatment: 27 Active Problems Location of Pain Severity  and Description of Pain Patient Has Paino No Site Locations Rate the pain. Current Pain Level: 0 Pain Management and Medication Current Pain Management: Electronic Signature(s) Signed: 07/13/2022 5:21:53 PM By: Adline Peals Entered By: Adline Peals on 07/13/2022 09:31:53 -------------------------------------------------------------------------------- Patient/Caregiver Education Details Patient Name: Date of Service: Anthony Barnes, [place]. 11/30/2023andnbsp9:15 A M Medical Record Number: [id number] Patient Account Number: [id number] Date of Birth/Gender: Treating RN: [birth year] (66 y.o. [name] Primary Care Physician: Charlott Rakes Other Clinician: Referring Physician: Treating Physician/Extender: Carter Kitten Weeks in Treatment: 14 Education Assessment Education Provided To: Patient Education Topics Provided Wound/Skin Impairment: Methods: Explain/Verbal Responses: Reinforcements needed, State content correctly Electronic Signature(s) Signed: 07/13/2022 5:21:53 PM By: Adline Peals Entered By: Adline Peals on 07/13/2022 13:50:40 [name] ([id number]) [id number] Page 6 of 7 -------------------------------------------------------------------------------- Wound Assessment Details Patient Name: Date of Service: Anthony Barnes [name] 07/13/2022 9:15 A M Medical Record Number: [id number] Patient Account Number: [id number] Date of Birth/Sex: Treating RN: Mar 06, 1956 (66 y.o. [name] Primary Care Chiyeko Ferre: Charlott Rakes Other Clinician: Referring Odarius Dines: Treating Sherol Sabas/Extender: Carter Kitten Weeks in Treatment: 27 Wound Status Wound Number: 2 Primary Etiology: Venous Leg Ulcer Wound Location: Right, Lateral Lower Leg Wound Status: Open Wounding Event: Trauma Comorbid History: Hypertension, Type II Diabetes Date Acquired: 12/03/2021 Weeks Of Treatment: 27 Clustered Wound: No Photos Wound Measurements Length: (cm) 2.6 Width: (cm) 0.9 Depth: (cm) 0.1 Area: (cm) 1.838 Volume: (cm) 0.184 % Reduction in Area: -73.4% % Reduction in Volume: -73.6% Epithelialization:  Medium (34-66%) Tunneling: No Undermining: No Wound Description Classification: Full Thickness Without Exposed Suppor Wound Margin: Distinct, outline attached Exudate Amount: Medium Exudate Type: Serosanguineous Exudate Color: red, brown t Structures Foul Odor After Cleansing: No Slough/Fibrino Yes Wound Bed Granulation Amount: Large (67-100%) Exposed Structure Granulation Quality: Red, Hyper-granulation Fascia Exposed: No Necrotic Amount: Small (1-33%) Fat Layer (Subcutaneous Tissue) Exposed: Yes Necrotic Quality: Adherent Slough Tendon Exposed: No Muscle Exposed: No Joint Exposed: No Bone Exposed: No Periwound Skin Texture Texture Color No Abnormalities Noted: Yes No Abnormalities Noted: Yes Moisture Temperature / Pain No Abnormalities Noted: No Temperature: No Abnormality Dry / Scaly: Yes Treatment Notes Wound #2 (Lower Leg) Wound Laterality: Right, Lateral LESSLIE, MCKEEHAN E ([id number]) [phone].pdf Page 7 of 7 Cleanser Soap and Water Discharge Instruction: May shower and wash wound with dial antibacterial soap and water prior to dressing change. Wound Cleanser Discharge Instruction: Cleanse the wound with wound cleanser prior to applying a clean dressing using gauze sponges, not tissue or cotton balls. Peri-Wound Care Triamcinolone 15 (g) Discharge Instruction: Use triamcinolone 15 (g) as directed Sween Lotion (Moisturizing lotion) Discharge Instruction: Apply moisturizing lotion as directed Topical Primary Dressing Endoform 2x2 in Discharge Instruction: Moisten with saline Secondary Dressing Woven Gauze Sponge, Non-Sterile 4x4 in Discharge Instruction: Apply over primary dressing as directed. Secured With 59M Medipore H Soft Cloth Surgical T ape, 4 x 10 (in/yd) Discharge Instruction: Secure with tape as directed. Compression Wrap ThreePress (3 layer compression wrap) Discharge Instruction: Apply three layer compression as  directed. Compression Stockings Add-Ons Electronic Signature(s) Signed: 07/13/2022 5:21:53 PM By: Adline Peals Entered By: Adline Peals on 07/13/2022 09:38:20 -------------------------------------------------------------------------------- Vitals Details Patient Name: Date of Service: Anthony Barnes, [name] 07/13/2022 9:15 A M Medical Record Number: [id number] Patient Account Number: [id number] Date of Birth/Sex: Treating RN: 06-Aug-1956 (66 y.o. [name] Primary Care Jaleena Viviani: Charlott Rakes Other Clinician: Referring Richardson Dubree: Treating Carlie Solorzano/Extender: Carter Kitten Weeks in Treatment: 27 Vital Signs Time  Taken: 09:31 Temperature (F): 97.9 Height (in): 75 Pulse (bpm): 55 Weight (lbs): 310 Respiratory Rate (breaths/min): 18 Body Mass Index (BMI): 38.7 Blood Pressure (mmHg): 171/92 Reference Range: 80 - 120 mg / dl Electronic Signature(s) Signed: 07/13/2022 5:21:53 PM By: Adline Peals Entered By: Adline Peals on 07/13/2022 09:31:48

## 2022-07-21 ENCOUNTER — Encounter (HOSPITAL_BASED_OUTPATIENT_CLINIC_OR_DEPARTMENT_OTHER): Payer: Medicare Other | Attending: General Surgery | Admitting: General Surgery

## 2022-07-21 DIAGNOSIS — I10 Essential (primary) hypertension: Secondary | ICD-10-CM | POA: Insufficient documentation

## 2022-07-21 DIAGNOSIS — W2209XA Striking against other stationary object, initial encounter: Secondary | ICD-10-CM | POA: Diagnosis not present

## 2022-07-21 DIAGNOSIS — E1151 Type 2 diabetes mellitus with diabetic peripheral angiopathy without gangrene: Secondary | ICD-10-CM | POA: Diagnosis not present

## 2022-07-21 DIAGNOSIS — E11622 Type 2 diabetes mellitus with other skin ulcer: Secondary | ICD-10-CM | POA: Diagnosis not present

## 2022-07-21 DIAGNOSIS — L97812 Non-pressure chronic ulcer of other part of right lower leg with fat layer exposed: Secondary | ICD-10-CM | POA: Insufficient documentation

## 2022-07-21 DIAGNOSIS — I872 Venous insufficiency (chronic) (peripheral): Secondary | ICD-10-CM | POA: Insufficient documentation

## 2022-07-21 DIAGNOSIS — L97822 Non-pressure chronic ulcer of other part of left lower leg with fat layer exposed: Secondary | ICD-10-CM | POA: Diagnosis not present

## 2022-07-22 NOTE — Progress Notes (Signed)
MATHIUS, BIRKELAND ([id number]) [id number] Page 1 of 8 Visit Report for 07/21/2022 Arrival Information Details Patient Name: Date of Service: Anthony Barnes [name] 07/21/2022 8:30 A M Medical Record Number: [id number] Patient Account Number: [id number] Date of Birth/Sex: Treating RN: May 15, 1956 (66 y.o. [name] Primary Care Khi Mcmillen: Charlott Rakes Other Clinician: Referring Michie Molnar: Treating Tywanna Seifer/Extender: Carter Kitten Weeks in Treatment: 28 Visit Information History Since Last Visit Added or deleted any medications: No Patient Arrived: Ambulatory Any new allergies or adverse reactions: No Arrival Time: 08:23 Had a fall or experienced change in No Accompanied By: self activities of daily living that may affect Transfer Assistance: None risk of falls: Patient Identification Verified: Yes Signs or symptoms of abuse/neglect since last visito No Secondary Verification Process Completed: Yes Hospitalized since last visit: No Patient Requires Transmission-Based Precautions: No Implantable device outside of the clinic excluding No Patient Has Alerts: No cellular tissue based products placed in the center since last visit: Has Dressing in Place as Prescribed: Yes Has Compression in Place as Prescribed: Yes Pain Present Now: No Electronic Signature(s) Signed: 07/21/2022 4:13:22 PM By: Adline Peals Entered By: Adline Peals on 07/21/2022 08:24:08 -------------------------------------------------------------------------------- Compression Therapy Details Patient Name: Date of Service: Anthony Floor, [name] 07/21/2022 8:30 A M Medical Record Number: [id number] Patient Account Number: [id number] Date of Birth/Sex: Treating RN: 1956/01/21 (66 y.o. [name] Primary Care Jarred Purtee: Charlott Rakes Other Clinician: Referring Angello Chien: Treating Randa Riss/Extender: Carter Kitten Weeks in  Treatment: 28 Compression Therapy Performed for Wound Assessment: Wound #2 Right,Lateral Lower Leg Performed By: Clinician Adline Peals, RN Compression Type: Three Layer Post Procedure Diagnosis Same as Pre-procedure Electronic Signature(s) Signed: 07/21/2022 4:13:22 PM By: Adline Peals Entered By: Adline Peals on 07/21/2022 10:19:33 [name] ([id number]) [id number] Page 2 of 8 -------------------------------------------------------------------------------- Encounter Discharge Information Details Patient Name: Date of Service: Anthony Barnes [name] 07/21/2022 8:30 A M Medical Record Number: [id number] Patient Account Number: [id number] Date of Birth/Sex: Treating RN: 23-Apr-1956 (66 y.o. [name] Primary Care Cyndal Kasson: Charlott Rakes Other Clinician: Referring Cyree Chuong: Treating Kyon Bentler/Extender: Carter Kitten Weeks in Treatment: 80 Encounter Discharge Information Items Discharge Condition: Stable Ambulatory Status: Ambulatory Discharge Destination: Home Transportation: Private Auto Accompanied By: self Schedule Follow-up Appointment: Yes Clinical Summary of Care: Patient Declined Electronic Signature(s) Signed: 07/21/2022 4:13:22 PM By: Adline Peals Entered By: Adline Peals on 07/21/2022 08:38:28 -------------------------------------------------------------------------------- Lower Extremity Assessment Details Patient Name: Date of Service: Anthony Floor, [name] 07/21/2022 8:30 A M Medical Record Number: [id number] Patient Account Number: [id number] Date of Birth/Sex: Treating RN: [birth year] (66 y.o. [name] Primary Care Anthony Barnes: Charlott Rakes Other Clinician: Referring Kmari Brian: Treating Mariaeduarda Defranco/Extender: Carter Kitten Weeks in Treatment: 28 Edema Assessment Assessed: [Left: No] [Right: No] [Left: Edema] [Right: :] Calf Left: Right: Point  of Measurement: From Medial Instep 43 cm Ankle Left: Right: Point of Measurement: From Medial Instep 26.2 cm Vascular Assessment Pulses: Dorsalis Pedis Palpable: [Right:Yes] Electronic Signature(s) Signed: 07/21/2022 4:13:22 PM By: Adline Peals Entered By: Adline Peals on 07/21/2022 08:31:06 Multi Wound Chart Details -------------------------------------------------------------------------------- [name] ([id number]) [id number] Page 3 of 8 Patient Name: Date of Service: Anthony Barnes [name] 07/21/2022 8:30 A M Medical Record Number: [id number] Patient Account Number: [id number] Date of Birth/Sex: Treating RN: 27-Jun-1956 (66 y.o. M) Primary Care Anthony Barnes: Charlott Rakes Other Clinician: Referring Anthony Barnes: Treating Anthony Barnes/Extender: Carter Kitten Weeks in Treatment: 28 Vital Signs Height(in): 75 Pulse(bpm): 74 Weight(lbs): 310 Blood  Pressure(mmHg): 182/105 Body Mass Index(BMI): 38.7 Temperature(F): 97.8 Respiratory Rate(breaths/min): 18 Wound Assessments Wound Number: 2 N/A N/A Photos: N/A N/A Right, Lateral Lower Leg N/A N/A Wound Location: Trauma N/A N/A Wounding Event: Venous Leg Ulcer N/A N/A Primary Etiology: Hypertension, Type II Diabetes N/A N/A Comorbid History: 12/03/2021 N/A N/A Date Acquired: 5 N/A N/A Weeks of Treatment: Open N/A N/A Wound Status: No N/A N/A Wound Recurrence: 0.5x0.5x0.1 N/A N/A Measurements L x W x D (cm) 0.196 N/A N/A A (cm) : rea 0.02 N/A N/A Volume (cm) : 81.50% N/A N/A % Reduction in A rea: 81.10% N/A N/A % Reduction in Volume: Full Thickness Without Exposed N/A N/A Classification: Support Structures Medium N/A N/A Exudate A mount: Serosanguineous N/A N/A Exudate Type: red, brown N/A N/A Exudate Color: Distinct, outline attached N/A N/A Wound Margin: Small (1-33%) N/A N/A Granulation A mount: Red N/A N/A Granulation Quality: Large  (67-100%) N/A N/A Necrotic A mount: Eschar N/A N/A Necrotic Tissue: Fat Layer (Subcutaneous Tissue): Yes N/A N/A Exposed Structures: Fascia: No Tendon: No Muscle: No Joint: No Bone: No Medium (34-66%) N/A N/A Epithelialization: Debridement - Selective/Open Wound N/A N/A Debridement: Pre-procedure Verification/Time Out 08:35 N/A N/A Taken: Lidocaine 5% topical ointment N/A N/A Pain Control: Necrotic/Eschar, Slough N/A N/A Tissue Debrided: Non-Viable Tissue N/A N/A Level: 1 N/A N/A Debridement A (sq cm): rea Curette N/A N/A Instrument: Minimum N/A N/A Bleeding: Pressure N/A N/A Hemostasis A chieved: Procedure was tolerated well N/A N/A Debridement Treatment Response: 0.5x0.5x0.1 N/A N/A Post Debridement Measurements L x W x D (cm) 0.02 N/A N/A Post Debridement Volume: (cm) No Abnormalities Noted N/A N/A Periwound Skin Texture: Dry/Scaly: Yes N/A N/A Periwound Skin Moisture: No Abnormalities Noted N/A N/A Periwound Skin Color: No Abnormality N/A N/A Temperature: Debridement N/A N/A Procedures Performed: Treatment Notes Wound #2 (Lower Leg) Wound Laterality: Right, Lateral Cleanser MARINO, ROGERSON E ([id number]) [id number] Page 4 of 8 Soap and Water Discharge Instruction: May shower and wash wound with dial antibacterial soap and water prior to dressing change. Wound Cleanser Discharge Instruction: Cleanse the wound with wound cleanser prior to applying a clean dressing using gauze sponges, not tissue or cotton balls. Peri-Wound Care Triamcinolone 15 (g) Discharge Instruction: Use triamcinolone 15 (g) as directed Sween Lotion (Moisturizing lotion) Discharge Instruction: Apply moisturizing lotion as directed Topical Primary Dressing Endoform 2x2 in Discharge Instruction: Moisten with saline Secondary Dressing Woven Gauze Sponge, Non-Sterile 4x4 in Discharge Instruction: Apply over primary dressing as directed. Secured  With 78M Medipore H Soft Cloth Surgical T ape, 4 x 10 (in/yd) Discharge Instruction: Secure with tape as directed. Compression Wrap ThreePress (3 layer compression wrap) Discharge Instruction: Apply three layer compression as directed. Compression Stockings Add-Ons Electronic Signature(s) Signed: 07/21/2022 8:55:53 AM By: Fredirick Maudlin MD FACS Entered By: Fredirick Maudlin on 07/21/2022 08:55:53 -------------------------------------------------------------------------------- Multi-Disciplinary Care Plan Details Patient Name: Date of Service: Anthony Floor, [name] 07/21/2022 8:30 A M Medical Record Number: [id number] Patient Account Number: [id number] Date of Birth/Sex: Treating RN: Nov 23, 1955 (66 y.o. [name] Primary Care Grayson Pfefferle: Charlott Rakes Other Clinician: Referring Nesbit Michon: Treating Josefita Weissmann/Extender: Carter Kitten Weeks in Treatment: 28 Active Inactive Venous Leg Ulcer Nursing Diagnoses: Actual venous Insuffiency (use after diagnosis is confirmed) Knowledge deficit related to disease process and management Goals: Patient will maintain optimal edema control Date Initiated: 01/02/2022 Target Resolution Date: 08/11/2022 Goal Status: Active Patient/caregiver will verbalize understanding of disease process and disease management Date Initiated: 01/02/2022 Date Inactivated: 03/07/2022 Target Resolution Date: 03/09/2022 Goal Status: Met Interventions:  SHANKAR, SILBER ([id number]) [id number] Page 5 of 8 Assess peripheral edema status every visit. Compression as ordered Provide education on venous insufficiency Treatment Activities: Non-invasive vascular studies : 01/02/2022 T ordered outside of clinic : 01/02/2022 est Therapeutic compression applied : 01/02/2022 Notes: Wound/Skin Impairment Nursing Diagnoses: Impaired tissue integrity Knowledge deficit related to ulceration/compromised skin  integrity Goals: Patient/caregiver will verbalize understanding of skin care regimen Date Initiated: 01/02/2022 Date Inactivated: 02/02/2022 Target Resolution Date: 02/03/2022 Goal Status: Met Ulcer/skin breakdown will have a volume reduction of 30% by week 4 Date Initiated: 01/02/2022 Date Inactivated: 06/08/2022 Target Resolution Date: 06/09/2022 Goal Status: Met Ulcer/skin breakdown will have a volume reduction of 50% by week 8 Date Initiated: 06/08/2022 Target Resolution Date: 08/11/2022 Goal Status: Active Interventions: Assess ulceration(s) every visit Provide education on ulcer and skin care Treatment Activities: Skin care regimen initiated : 01/02/2022 Topical wound management initiated : 01/02/2022 Notes: Electronic Signature(s) Signed: 07/21/2022 4:13:22 PM By: Adline Peals Entered By: Adline Peals on 07/21/2022 08:37:37 -------------------------------------------------------------------------------- Pain Assessment Details Patient Name: Date of Service: Anthony Floor, [name] 07/21/2022 8:30 A M Medical Record Number: [id number] Patient Account Number: [id number] Date of Birth/Sex: Treating RN: 05/01/1956 (66 y.o. [name] Primary Care Sander Remedios: Charlott Rakes Other Clinician: Referring Presley Summerlin: Treating Satia Winger/Extender: Carter Kitten Weeks in Treatment: 28 Active Problems Location of Pain Severity and Description of Pain Patient Has Paino No Site Locations Rate the pain. NORI, POLAND ([id number]) [id number] Page 6 of 8 Rate the pain. Current Pain Level: 0 Pain Management and Medication Current Pain Management: Electronic Signature(s) Signed: 07/21/2022 4:13:22 PM By: Adline Peals Entered By: Adline Peals on 07/21/2022 08:24:31 -------------------------------------------------------------------------------- Patient/Caregiver Education Details Patient Name: Date of  Service: Anthony Floor, [place]. 12/8/2023andnbsp8:30 A M Medical Record Number: [id number] Patient Account Number: [id number] Date of Birth/Gender: Treating RN: 02/24/1956 (66 y.o. [name] Primary Care Physician: Charlott Rakes Other Clinician: Referring Physician: Treating Physician/Extender: Carter Kitten Weeks in Treatment: 41 Education Assessment Education Provided To: Patient Education Topics Provided Wound/Skin Impairment: Methods: Explain/Verbal Responses: Reinforcements needed, State content correctly Electronic Signature(s) Signed: 07/21/2022 4:13:22 PM By: Adline Peals Entered By: Adline Peals on 07/21/2022 08:37:50 -------------------------------------------------------------------------------- Wound Assessment Details Patient Name: Date of Service: Anthony Floor, [name] 07/21/2022 8:30 A M Medical Record Number: [id number] Patient Account Number: [id number] Date of Birth/Sex: Treating RN: [birth year] (66 y.o. [name] Primary Care Sharis Keeran: Charlott Rakes Other Clinician: Referring Cosme Jacob: Treating Aliene Tamura/Extender: Duffy, Dantonio ([id number]) [id number] Page 7 of 8 Weeks in Treatment: 28 Wound Status Wound Number: 2 Primary Etiology: Venous Leg Ulcer Wound Location: Right, Lateral Lower Leg Wound Status: Open Wounding Event: Trauma Comorbid History: Hypertension, Type II Diabetes Date Acquired: 12/03/2021 Weeks Of Treatment: 28 Clustered Wound: No Photos Wound Measurements Length: (cm) 0.5 Width: (cm) 0.5 Depth: (cm) 0.1 Area: (cm) 0.196 Volume: (cm) 0.02 % Reduction in Area: 81.5% % Reduction in Volume: 81.1% Epithelialization: Medium (34-66%) Tunneling: No Undermining: No Wound Description Classification: Full Thickness Without Exposed Support Structures Wound Margin: Distinct, outline attached Exudate Amount: Medium Exudate  Type: Serosanguineous Exudate Color: red, brown Foul Odor After Cleansing: No Slough/Fibrino No Wound Bed Granulation Amount: Small (1-33%) Exposed Structure Granulation Quality: Red Fascia Exposed: No Necrotic Amount: Large (67-100%) Fat Layer (Subcutaneous Tissue) Exposed: Yes Necrotic Quality: Eschar Tendon Exposed: No Muscle Exposed: No Joint Exposed: No Bone Exposed: No Periwound Skin Texture Texture Color No Abnormalities Noted: Yes No Abnormalities Noted: Yes Moisture Temperature /  Pain No Abnormalities Noted: No Temperature: No Abnormality Dry / Scaly: Yes Treatment Notes Wound #2 (Lower Leg) Wound Laterality: Right, Lateral Cleanser Soap and Water Discharge Instruction: May shower and wash wound with dial antibacterial soap and water prior to dressing change. Wound Cleanser Discharge Instruction: Cleanse the wound with wound cleanser prior to applying a clean dressing using gauze sponges, not tissue or cotton balls. Peri-Wound Care Triamcinolone 15 (g) Discharge Instruction: Use triamcinolone 15 (g) as directed Sween Lotion (Moisturizing lotion) Discharge Instruction: Apply moisturizing lotion as directed Topical MELBOURNE, JAKUBIAK ([id number]) [id number] Page 8 of 8 Primary Dressing Endoform 2x2 in Discharge Instruction: Moisten with saline Secondary Dressing Woven Gauze Sponge, Non-Sterile 4x4 in Discharge Instruction: Apply over primary dressing as directed. Secured With 41M Medipore H Soft Cloth Surgical T ape, 4 x 10 (in/yd) Discharge Instruction: Secure with tape as directed. Compression Wrap ThreePress (3 layer compression wrap) Discharge Instruction: Apply three layer compression as directed. Compression Stockings Add-Ons Electronic Signature(s) Signed: 07/21/2022 4:13:22 PM By: Adline Peals Entered By: Adline Peals on 07/21/2022  08:33:42 -------------------------------------------------------------------------------- Vitals Details Patient Name: Date of Service: Anthony Floor, [name] 07/21/2022 8:30 A M Medical Record Number: [id number] Patient Account Number: [id number] Date of Birth/Sex: Treating RN: 02-Oct-1955 (66 y.o. [name] Primary Care Mahdi Frye: Charlott Rakes Other Clinician: Referring Lachrista Heslin: Treating [place] Magner/Extender: Carter Kitten Weeks in Treatment: 28 Vital Signs Time Taken: 08:24 Temperature (F): 97.8 Height (in): 75 Pulse (bpm): 74 Weight (lbs): 310 Respiratory Rate (breaths/min): 18 Body Mass Index (BMI): 38.7 Blood Pressure (mmHg): 182/105 Reference Range: 80 - 120 mg / dl Notes pt just took his BP meds before coming in Electronic Signature(s) Signed: 07/21/2022 4:13:22 PM By: Adline Peals Entered By: Adline Peals on 07/21/2022 10:20:08

## 2022-07-22 NOTE — Progress Notes (Signed)
RILEY, HALLUM ([id number]) [id number] Page 1 of 13 Visit Report for 07/21/2022 Chief Complaint Document Details Patient Name: Date of Service: Anthony Barnes [name] 07/21/2022 8:30 A M Medical Record Number: [id number] Patient Account Number: [id number] Date of Birth/Sex: Treating RN: 02/22/1956 (66 y.o. M) Primary Care Provider: Hoy Register Other Clinician: Referring Provider: Treating Provider/Extender: Camillo Flaming Weeks in Treatment: 28 Information Obtained from: Patient Chief Complaint Right LE Ulcer Electronic Signature(s) Signed: 07/21/2022 8:56:00 AM By: Duanne Guess MD FACS Entered By: Duanne Guess on 07/21/2022 08:56:00 -------------------------------------------------------------------------------- Debridement Details Patient Name: Date of Service: Anthony Barnes, Anthony [name] 07/21/2022 8:30 A M Medical Record Number: [id number] Patient Account Number: [id number] Date of Birth/Sex: Treating RN: 1956/01/14 (67 y.o. Anthony Barnes Primary Care Provider: Hoy Register Other Clinician: Referring Provider: Treating Provider/Extender: Camillo Flaming Weeks in Treatment: 28 Debridement Performed for Assessment: Wound #2 Right,Lateral Lower Leg Performed By: Physician Duanne Guess, MD Debridement Type: Debridement Severity of Tissue Pre Debridement: Fat layer exposed Level of Consciousness (Pre-procedure): Awake and Alert Pre-procedure Verification/Time Out Yes - 08:35 Taken: Start Time: 08:35 Pain Control: Lidocaine 5% topical ointment T Area Debrided (L x W): otal 1 (cm) x 1 (cm) = 1 (cm) Tissue and other material debrided: Non-Viable, Eschar, Slough, Slough Level: Non-Viable Tissue Debridement Description: Selective/Open Wound Instrument: Curette Bleeding: Minimum Hemostasis Achieved: Pressure Response to Treatment: Procedure was tolerated well Level of Consciousness (Post- Awake  and Alert procedure): Post Debridement Measurements of Total Wound Length: (cm) 0.5 Width: (cm) 0.5 Depth: (cm) 0.1 Volume: (cm) 0.02 Character of Wound/Ulcer Post Debridement: Improved Severity of Tissue Post Debridement: Fat layer exposed Post Procedure Diagnosis Same as Pre-procedure Anthony Barnes, Anthony Barnes ([id number]) [id number] Page 2 of 13 Notes scribed for Dr. Lady Gary by Samuella Bruin, RN Electronic Signature(s) Signed: 07/21/2022 8:59:48 AM By: Duanne Guess MD FACS Signed: 07/21/2022 4:13:22 PM By: Gelene Mink By: Samuella Bruin on 07/21/2022 08:39:03 -------------------------------------------------------------------------------- HPI Details Patient Name: Date of Service: Anthony Barnes, Anthony [name] 07/21/2022 8:30 A M Medical Record Number: [id number] Patient Account Number: [id number] Date of Birth/Sex: Treating RN: Jan 03, 1956 (66 y.o. M) Primary Care Provider: Hoy Register Other Clinician: Referring Provider: Treating Provider/Extender: Camillo Flaming Weeks in Treatment: 28 History of Present Illness HPI Description: ADMISSION 03/12/18 This is a 66 year old and it was a type II diabetic reasonably poorly controlled with a recent hemoglobin A1c of 8.2. He was tending to his lawn on 01/15/18 when his weed [name] sent a rock up word hitting him in the right anterior leg. He was seen in his primary M.D. office is same time. An x-ray showed no abnormality. He was given a course of cephalexin. Since then he's been applying peroxide and topical antibiotics to the wound. He does not have a history of chronic wounds however he does have chronic edema in the lower legs. He is complaining of pain in the right leg wound but no real history of claudication. Past medical history includes type 2 diabetes, hypertension, morbid obesity. ABIs in the right leg were noncompressible 03/19/18 on evaluation today patient appears to  be tolerating the wrap in general fairly well. He states he's not having that much pain in regard to the wound itself. With that being said he is having some issues with slough buildup on the surface of the wound the Iodoflex does seem to be helpful in that regard. He is still having discomfort he wonders if I can send in a prescription for ibuprofen  or something of like to help him out. I do believe that the prox end may be of benefit for him. 03/25/18 on evaluation today patient appears to be doing very well in regard to his right lateral lower [name]. He's been tolerating the dressing changes without complication that is the wraps. Nonetheless he does continue to have pain he states is been dreading the possibility of having to have debridement again today. His first debridement experience was not optimal. With that being said he does seem to be showing signs of improvement there does appear to be little bit more granulation he does have a lot of slough that really does need to breeding away. 04/04/18;the patient went for arterial studies that were really quite normal. ABI on the right at 1.19 with triphasic waveforms on the left 1.11 with triphasic waveforms. TBI 0.93 on the right and 0.95 on the left. This suggests T should be able to tolerate even 4 layer compression. He is however complaining of a lot of pain with our wraps I'm wondering whether the pain is simply Iodoflex. I changed him to [employer] I'm still going to try to keep him in 3 layer compression 04/12/18 on evaluation today patient actually appears to be doing rather well in regard to his right lower [name] ulcer. He is making good progress although it is slow still I do believe that the Santyl is much better than the Iodoflex. The good news is the patient seems to be tolerating the dressing changes without complication now that we've gotten good of the Iodoflex which really did burn him quite  significantly. Otherwise there's no evidence of infection. 04/17/18 on evaluation today patient actually appears to be showing signs of improvement in regard to the ulcer. Unfortunately he's had somewhat of a rough day due to the fact that he found out that one of his friends suddenly and unexpectedly passed today. He states he's not sure he's in the frame of mind to allow me to debride the wound at this point. Nonetheless I do feel like he is showing signs of the wound getting better little by little each week. 04/24/18 on evaluation today patient's ulcer actually appears to be showing some signs of improvement albeit slow. He has been tolerating the dressing changes without complication except for the wrap which she states was so tight that he had to remove it it was causing a lot of discomfort. Fortunately there is no evidence of infection at this point. He states he only wore the wrap until about the time he got home last week and that he had to take it off. Nonetheless he does not have any other compression stockings, Juxta-Lite wraps, or anything otherwise to really help at this point as far as compression is concerned. 05/01/18 on evaluation today patient actually appears to be doing fairly well in regard to his lower extremity ulcer. He has been tolerating the dressing changes currently without complication. The wrap does seem to be helping as far as the fluid management is concerned. Wound bed itself actually show signs of good improvement although there is some Slough noted there's not as much as previous and he actually has fairly decent granulation noted as well. Overall I'm pleased with the progress of to this point. The patient would prefer not to have any debridement today he states he's actually not feeling too well in general and he really does not want any additional discomfort typically debridement is fairly uncomfortable for  him. 05/08/18 on evaluation today patient actually appears to be  doing a little better in regard to his lower extremity ulcer. He has been tolerating the dressing changes without complication. With that being said I'm actually quite pleased with the fact the wound is not appear to be infected and in general has made a little progress. With that being said I do believe we need to perform some debridement to clean away the necrotic tissue on the surface of the wound. 05/15/18 on evaluation today patient actually appears to be doing a little better in regard to his wound. Fortunately there is some improvement noted fortunately there's also no significant evidence of infection at this point in time. He does have continued pain that really nothing different than previous he did get his Juxta-Lite wrap. 05/29/18 on evaluation today patient actually appears to be doing somewhat better in regard to his wound currently. He's been tolerating the dressing changes without complication. With that being said he has been performing the [facility] Dressing changes every day at home. I'm pretty sure that we told him KAYSEN, DEAL ([id number]) [id number] Page 3 of 13 every other day last week or when I saw him rather two weeks ago. With that being said obviously did not hurt to do it daily just that obviously is gonna run out of supplies sooner and that could get him into trouble as far as his insurance is concerned. Nonetheless he at this point still continues to have pain although honestly I don't feel like it's as bad as what it was in the past just based on his reactions at this point. 06/12/18 on evaluation today patient actually appears to be doing better in regard to his lower [name] ulcer. He's been tolerating the dressing changes without complication. Fortunately there does not appear to be any evidence of infection at this time. No fevers, chills, nausea, or vomiting noted at this time. 06/19/18 evaluation today patient's ulcer on the  lower extremity appears to be doing better at this point. he has been tolerating the dressing changes. The patient seems to be somewhat depressed about the progress of his wound although the overall appearance at this time seems to be doing well. In general I'm very pleased with the appearance today he does have some biofilm on the surface of the wound as well as of hyper granular tissue we may want to utilize a little bit of silver nitrate today. 06/27/2018; patient comes into clinic today for a wound on the right lateral calf likely related to chronic venous insufficiency. He has been using medihoney covered with Hydrofera Blue. Wound surface actually looks quite good 07/03/2018 seen today for follow-up and management of lower extremity ulcer right lateral shin. T olerating dressing changes. He has a small amount of biofilm today. Will attempt to remove a portion of the biofilm as he tolerates. He becomes very anxious with wound treatments. He would benefit from layer compression wraps however he refuses compression wraps or anything that smokes his legs. He expressed an inability to tolerate compression wraps. Juxta lite wraps have been ordered. He has been instructed on the appropriate application of the juxta leg wraps. His blood pressure today on visit was 200/120. He denies any blurred vision, chest pain, dizziness, shortness of breath, or difficulty with mobility. Mr. Grattan stated that he had a recent visit with his primary care provider. At that time his carvedilol was decreased from 25 mg daily to 12.5 mg daily. Strongly encouraged Mr. Faries  to seek medical attention today during visit. He declined transport for evaluation of elevated blood pressure. Encouraged him to contact his primary care provider today for medication management. He stated that he would call his primary care doctor after wound visit today. Also encouraged him that if he experienced any symptoms of blurred vision,  chest pain, shortness of breath to immediately seek medical attention. 07/17/18 on evaluation today patient's wound actually does seem to show signs of improvement based on the overall appearance of the wound bed today. There does not appear to show any signs of infection which is good news. There is no overall worsening which is also good news. He still has hyper granular tissue will use in the [employer] followed by [facility] Dressing this point. 07/31/18 on evaluation today patient's wound bed actually show signs of improvement with good epithelialization especially in the upper portion of the wound. He has been tolerating the dressing changes without complication in general. Overall I'm extremely happy with how things stand. No fevers, chills, nausea, or vomiting noted at this time. 08/28/18 on evaluation today patient appears to be doing a little better in regard to his lower extremity ulcer. With that being said it appears to be very hyper granular I think this is directly attributed to the fact that he continues to use the [employer] underneath the [facility] Dressing. Although this helps not to stick unfortunately also think he's not getting the benefit of the [facility] Dressing particular and subsequently is causing too much moisture buildup hence the hyper granulation. Fortunately there does not appear to be any signs of infection at this time which is good news. 09/03/18 on evaluation today patient appears to be doing well in regard to his lower [name] ulcer. He has been tolerating the dressing changes without complication. Fortunately he has much less hyper granular tissue the noted last week I do think using silver nitrate in changing to using the [facility] Dressing without the mepitel has made a big difference for him this is good news. 09/18/18 on evaluation today patient appears to be doing excellent in regard to his right lower [name] ulcer. This is actually significantly  smaller even compared to the last evaluation. Overall I'm very pleased in this regard. I'm a recommend that we likely repeat the silver nitrate today based on what I'm seeing. 10/02/18 on evaluation today patient appears to be doing excellent in regard to his lower extremity wound on the right. He's been tolerating the dressing changes without complication. Fortunately there's no signs of infection at this time. Overall been very pleased with how things seem to be progressing. No fevers, chills, nausea, or vomiting noted at this time. 10/16/18 on evaluation today patient actually appears to be doing much better in regard to his lower extremity wound on the right. This is shown signs of good improvement and is indeed measuring smaller he is on a excellent track as far as healing is concerned. My hope is this will be healed of the next several weeks. Fortunately there is no evidence of infection at this time. He does seem to be doing everything that I'm recommending for him 10/30/18 on evaluation today patient appears to be doing better in regard to his lower extremity ulcer. He's been tolerating the dressing changes without complication. Fortunately the Hydrofera Blue Dressing to be doing the job. He has made excellent progress. With that being said this is still going somewhat slow but nonetheless is always making good progress at this  point. 5/18-Patient was in to be seen for right leg ulcer that appeared after scab above it was removed today. This area had healed completely. 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 The wound has not healed though it has been nearly 2 months since the injury occurred. He was seen in the  emergency department at [facility] on May 9. At that time it was very painful and he described it as a "15 on a scale of 010". He also was found to have 3+ pitting edema to the bilateral lower extremities. He was prescribed a weeks course of Keflex. He is here today because the wound has failed to heal and he has a prior history in our clinic. ABI in clinic today was noncompressible. He did have formal vascular studies performed in 2019 which were normal. The last hemoglobin A1c I have available for review was from March 21, 2021 and was elevated at 7.7%. The wound is fairly small and circular located about 3 inches above his right lateral malleolus. It is completely covered with eschar. No surrounding erythema, no odor, no purulent drainage. 01/11/2022: The wound on his right lateral leg is a little bit smaller today. It has reaccumulated some slough and a small bit of eschar. It remains fairly painful. He has another area on his anterior tibia that he will not let me touch but I am concerned that there is a wound forming underneath the surface. 01/18/2022: Unfortunately, his wound is a little bit bigger today. He continues to accumulate slough on the wound surface. It is still quite tender. 01/25/2022: The patient bumped his wound on the car door which resulted in bleeding. He was seen at urgent care where it was redressed. Unfortunately, the wound is bigger again today. There is accumulated slough on the surface. Urgent care gave him some tramadol, apparently and he took this prior to his visit so he could tolerate debridement better. 02/02/2022: The wound is a little bit smaller today. The surface, however, has accumulated a fairly thick layer of slough. The periwound skin is intact and there is no obvious sign of infection. 02/07/2022: The wound is a bit larger today. It has thick slough accumulation. The periwound skin is intact and there is no obvious erythema, induration, nor any odor. 02/16/2022: I  took a culture last week due to the appearance of the wound and the patient's degree of pain. This grew out methicillin sensitive Staph aureus. Augmentin was prescribed. The patient states that the pain is markedly improved today. The wound also looks much better. It is smaller with less slough buildup and there is good granulation tissue emerging. 02/21/2022: The patient's pain is quite improved from prior visits. The wound is about the same size and has some accumulated slough on the surface. The base JEN, EPPINGER ([id number]) [id number] Page 4 of 13 is a little bit fibrotic but there are some areas of granulation tissue filling in. 02/28/2022: The wound is a little bit narrower on its measurements but slightly longer. There is still some slough accumulation on the surface, but he has minimal pain and there is no concern for ongoing infection. Granulation tissue is emerging. 03/07/2022: During the week, he cut holes in his  wrap because it was painful and too tight. As a result, his leg was quite a bit more swollen today and his wound was larger. The wound surface is fairly clean but a little bit dry. Minimal slough accumulation. Granulation tissue present. 03/14/2022: Once again, he did some arts and crafts projects with his wrap. On the other hand, however, his wound does look better. It is smaller today and is beginning to fill in. Minimal slough and a small amount of eschar accumulation. 03/27/2022: His wound measured bigger today, although it does not appear to be so on my impression. The wound continues to fill with granulation tissue. There is a little bit of slough and eschar accumulation. He continues to cut his wrap off of his feet and his edema control at the ankle is particularly poor with 3+ pitting edema. His insurance will not cover any sort of skin substitute unfortunately. 04/04/2022: The wound is smaller today. It is more superficial with good granulation  tissue and just a little bit of slough. He continues to cut away portions of his wrap which results in inadequate edema control. 04/11/2022: His wound measured a couple millimeters larger today. It continues to fill with granulation tissue and there is minimal slough on the surface. 04/19/2022: No significant change in the wound dimensions, but the surface is healthier and less fibrotic. 04/26/2022: The wound is a little bit bigger again today. There is slough accumulation on the surface. Edema control is inadequate. He has been cutting the foot portion of his wrap off each week. 05/03/2022: The wound is a little smaller this week. There is still a fair amount of slough on the surface. His drainage was a little bit as greenish today, although not the blue-green typically associated with Pseudomonas. No significant odor. The underlying granulation tissue appears healthy. The culture that I took last week grew out methicillin sensitive Staph aureus. 05/10/2022: The wound is again a little bit smaller this week. He still has slough accumulation on the surface. Most of the surface has fairly decent granulation tissue, but there is still a fibrotic portion in the center near the caudal aspect of the wound. He did not pick up the Augmentin that I prescribed for his MSSA infection. He says he will get it today. 05/19/2022: His wound is smaller by half a centimeter today. There is a little bit of slough buildup, but less so than at previous visits. He has not been taking his Augmentin properly. He has been splitting the tablets in half and taking them on an irregular schedule. He says this is because it has been upsetting his stomach. 05/25/2022: His wound is a little bit smaller again this week. Very minimal slough accumulation. The wound is drier than I would like to see. He completed his course of Augmentin. 10/19; patient has a wound on the right lateral lower leg in the setting of obvious chronic venous  insufficiency. He has had a previous wound on the anterior leg that we dealt with several years ago. He has been using Hydrofera Blue. Kerlix and Coban 06/08/2022: The wound is smaller again this week. There is some light slough accumulation. It remains a little dry. 06/16/2022: Although the wound dimensions were the same this week, on visual inspection it appears smaller. It is clean, but still dry despite the use of hydrogel. 06/22/2022: His wound responded very well to endoform. It is smaller, moisture balance is excellent, and the surface has lovely granulation tissue. 06/29/2022: His wound is smaller again  this week. The granulation tissue is a little bit hypertrophic. No concern for infection. 07/13/2022: His wound continues to improve. The open area is much smaller, but there is still some hypertrophic granulation tissue present. 07/21/2022: The wound is covered by a thick dark scab. Once this was removed, the majority of the wound was found to be healed, with just a couple of open areas with some slough on them. Electronic Signature(s) Signed: 07/21/2022 8:57:40 AM By: Duanne Guess MD FACS Entered By: Duanne Guess on 07/21/2022 08:57:40 -------------------------------------------------------------------------------- Physical Exam Details Patient Name: Date of Service: Anthony Barnes, Anthony [name] 07/21/2022 8:30 A M Medical Record Number: [id number] Patient Account Number: [id number] Date of Birth/Sex: Treating RN: 1955/08/16 (66 y.o. M) Primary Care Provider: Hoy Register Other Clinician: Referring Provider: Treating Provider/Extender: Camillo Flaming Weeks in Treatment: 75 Constitutional He is hypertensive, but asymptomatic.. . . . No acute distress. Respiratory Normal work of breathing on room air. Anthony Barnes, Anthony Barnes ([id number]) [id number] Page 5 of 13 Notes 07/21/2022: The wound is covered by a thick dark scab. Once this was  removed, the majority of the wound was found to be healed, with just a couple of open areas with some slough on them. Electronic Signature(s) Signed: 07/21/2022 8:58:16 AM By: Duanne Guess MD FACS Entered By: Duanne Guess on 07/21/2022 08:58:16 -------------------------------------------------------------------------------- Physician Orders Details Patient Name: Date of Service: Anthony Barnes, Anthony [name] 07/21/2022 8:30 A M Medical Record Number: [id number] Patient Account Number: [id number] Date of Birth/Sex: Treating RN: Jun 22, 1956 (66 y.o. Anthony Barnes Primary Care Provider: Hoy Register Other Clinician: Referring Provider: Treating Provider/Extender: Camillo Flaming Weeks in Treatment: 23 Verbal / Phone Orders: No Diagnosis Coding ICD-10 Coding Code Description [phone] Non-pressure chronic ulcer of other part of right lower leg with fat layer exposed E11.622 Type 2 diabetes mellitus with other skin ulcer E11.9 Type 2 diabetes mellitus without complications I10 Essential (primary) hypertension I87.2 Venous insufficiency (chronic) (peripheral) Follow-up Appointments ppointment in 1 week. - Dr. Lady Gary - room 2 Return A Anesthetic Wound #2 Right,Lateral Lower Leg (In clinic) Topical Lidocaine 5% applied to wound bed Bathing/ Shower/ Hygiene May shower with protection but do not get wound dressing(s) wet. - may purchase a cast protector from [facility] or [facility] Edema Control - Lymphedema / SCD / Other Elevate legs to the level of the heart or above for 30 minutes daily and/or when sitting, a frequency of: Avoid standing for long periods of time. Patient to wear own compression stockings every day. Exercise regularly Moisturize legs daily. Compression stocking or Garment 20-30 mm/Hg pressure to: - to L left until R leg is healed Wound Treatment Wound #2 - Lower Leg Wound Laterality: Right, Lateral Cleanser: Soap and Water 1 x Per Week/30  Days Discharge Instructions: May shower and wash wound with dial antibacterial soap and water prior to dressing change. Cleanser: Wound Cleanser 1 x Per Week/30 Days Discharge Instructions: Cleanse the wound with wound cleanser prior to applying a clean dressing using gauze sponges, not tissue or cotton balls. Peri-Wound Care: Triamcinolone 15 (g) 1 x Per Week/30 Days Discharge Instructions: Use triamcinolone 15 (g) as directed Peri-Wound Care: Sween Lotion (Moisturizing lotion) 1 x Per Week/30 Days Discharge Instructions: Apply moisturizing lotion as directed Prim Dressing: Endoform 2x2 in 1 x Per Week/30 Days ary Discharge Instructions: Moisten with saline Secondary Dressing: Woven Gauze Sponge, Non-Sterile 4x4 in 1 x Per Week/30 Days Discharge Instructions: Apply over primary dressing as directed. Secured With: 69M Medipore H Soft  Cloth Surgical Tape, 4 x 10 (in/yd) 1 x Per Week/30 Days Anthony Barnes, Anthony Barnes ([id number]) [id number] Page 6 of 13 Discharge Instructions: Secure with tape as directed. Compression Wrap: ThreePress (3 layer compression wrap) 1 x Per Week/30 Days Discharge Instructions: Apply three layer compression as directed. Patient Medications llergies: Shellfish Containing Products, codeine A Notifications Medication Indication Start End 07/21/2022 lidocaine DOSE topical 5 % ointment - ointment topical Electronic Signature(s) Signed: 07/21/2022 8:59:48 AM By: Duanne Guessannon, Kadisha Goodine MD FACS Entered By: Duanne Guessannon, Thula Stewart on 07/21/2022 08:58:43 -------------------------------------------------------------------------------- Problem List Details Patient Name: Date of Service: Anthony HarborGLA DNEY, Anthony [name] 07/21/2022 8:30 A M Medical Record Number: [id number] Patient Account Number: [id number] Date of Birth/Sex: Treating RN: [birth year] (66 y.o. M) Primary Care Provider: Hoy RegisterNewlin, Enobong Other Clinician: Referring Provider: Treating Provider/Extender: Whitney Museannon,  Brayla Pat Newlin, Odette HornsEnobong Weeks in Treatment: 28 Active Problems ICD-10 Encounter Code Description Active Date MDM Diagnosis L97.812 Non-pressure chronic ulcer of other part of right lower leg with fat layer 01/02/2022 No Yes exposed E11.622 Type 2 diabetes mellitus with other skin ulcer 01/02/2022 No Yes E11.9 Type 2 diabetes mellitus without complications 01/02/2022 No Yes I10 Essential (primary) hypertension 01/02/2022 No Yes I87.2 Venous insufficiency (chronic) (peripheral) 01/02/2022 No Yes Inactive Problems Resolved Problems Electronic Signature(s) Signed: 07/21/2022 8:55:46 AM By: Duanne Guessannon, Demeshia Sherburne MD FACS Entered By: Duanne Guessannon, Isiah Scheel on 07/21/2022 08:55:45 Anthony Barnes, Anthony Barnes ([id number]) [id number] Page 7 of 13 -------------------------------------------------------------------------------- Progress Note Details Patient Name: Date of Service: Anthony HarkGLA DNEY, Anthony [name] 07/21/2022 8:30 A M Medical Record Number: [id number] Patient Account Number: [id number] Date of Birth/Sex: Treating RN: [birth year] (66 y.o. M) Primary Care Provider: Hoy RegisterNewlin, Enobong Other Clinician: Referring Provider: Treating Provider/Extender: Camillo Flamingannon, Elinore Shults Newlin, Enobong Weeks in Treatment: 28 Subjective Chief Complaint Information obtained from Patient Right LE Ulcer History of Present Illness (HPI) ADMISSION 03/12/18 This is a 66 year old and it was a type II diabetic reasonably poorly controlled with a recent hemoglobin A1c of 8.2. He was tending to his lawn on 01/15/18 when his weed [name] sent a rock up word hitting him in the right anterior leg. He was seen in his primary M.D. office is same time. An x-ray showed no abnormality. He was given a course of cephalexin. Since then he's been applying peroxide and topical antibiotics to the wound. He does not have a history of chronic wounds however he does have chronic edema in the lower legs. He is complaining of pain in the right  leg wound but no real history of claudication. Past medical history includes type 2 diabetes, hypertension, morbid obesity. ABIs in the right leg were noncompressible 03/19/18 on evaluation today patient appears to be tolerating the wrap in general fairly well. He states he's not having that much pain in regard to the wound itself. With that being said he is having some issues with slough buildup on the surface of the wound the Iodoflex does seem to be helpful in that regard. He is still having discomfort he wonders if I can send in a prescription for ibuprofen or something of like to help him out. I do believe that the prox end may be of benefit for him. 03/25/18 on evaluation today patient appears to be doing very well in regard to his right lateral lower [name]. He's been tolerating the dressing changes without complication that is the wraps. Nonetheless he does continue to have pain he states is been dreading the possibility of having to have debridement again today. His first debridement experience was not optimal.  With that being said he does seem to be showing signs of improvement there does appear to be little bit more granulation he does have a lot of slough that really does need to breeding away. 04/04/18;the patient went for arterial studies that were really quite normal. ABI on the right at 1.19 with triphasic waveforms on the left 1.11 with triphasic waveforms. TBI 0.93 on the right and 0.95 on the left. This suggests T should be able to tolerate even 4 layer compression. He is however complaining of a lot of pain with our wraps I'm wondering whether the pain is simply Iodoflex. I changed him to [employer] I'm still going to try to keep him in 3 layer compression 04/12/18 on evaluation today patient actually appears to be doing rather well in regard to his right lower [name] ulcer. He is making good progress although it is slow still I do believe that  the Santyl is much better than the Iodoflex. The good news is the patient seems to be tolerating the dressing changes without complication now that we've gotten good of the Iodoflex which really did burn him quite significantly. Otherwise there's no evidence of infection. 04/17/18 on evaluation today patient actually appears to be showing signs of improvement in regard to the ulcer. Unfortunately he's had somewhat of a rough day due to the fact that he found out that one of his friends suddenly and unexpectedly passed today. He states he's not sure he's in the frame of mind to allow me to debride the wound at this point. Nonetheless I do feel like he is showing signs of the wound getting better little by little each week. 04/24/18 on evaluation today patient's ulcer actually appears to be showing some signs of improvement albeit slow. He has been tolerating the dressing changes without complication except for the wrap which she states was so tight that he had to remove it it was causing a lot of discomfort. Fortunately there is no evidence of infection at this point. He states he only wore the wrap until about the time he got home last week and that he had to take it off. Nonetheless he does not have any other compression stockings, Juxta-Lite wraps, or anything otherwise to really help at this point as far as compression is concerned. 05/01/18 on evaluation today patient actually appears to be doing fairly well in regard to his lower extremity ulcer. He has been tolerating the dressing changes currently without complication. The wrap does seem to be helping as far as the fluid management is concerned. Wound bed itself actually show signs of good improvement although there is some Slough noted there's not as much as previous and he actually has fairly decent granulation noted as well. Overall I'm pleased with the progress of to this point. The patient would prefer not to have any debridement today he states  he's actually not feeling too well in general and he really does not want any additional discomfort typically debridement is fairly uncomfortable for him. 05/08/18 on evaluation today patient actually appears to be doing a little better in regard to his lower extremity ulcer. He has been tolerating the dressing changes without complication. With that being said I'm actually quite pleased with the fact the wound is not appear to be infected and in general has made a little progress. With that being said I do believe we need to perform some debridement to clean away the necrotic tissue on the surface of the wound.  05/15/18 on evaluation today patient actually appears to be doing a little better in regard to his wound. Fortunately there is some improvement noted fortunately there's also no significant evidence of infection at this point in time. He does have continued pain that really nothing different than previous he did get his Juxta-Lite wrap. 05/29/18 on evaluation today patient actually appears to be doing somewhat better in regard to his wound currently. He's been tolerating the dressing changes without complication. With that being said he has been performing the [facility] Dressing changes every day at home. I'm pretty sure that we told him every other day last week or when I saw him rather two weeks ago. With that being said obviously did not hurt to do it daily just that obviously is gonna run out of supplies sooner and that could get him into trouble as far as his insurance is concerned. Nonetheless he at this point still continues to have pain although honestly I don't feel like it's as bad as what it was in the past just based on his reactions at this point. 06/12/18 on evaluation today patient actually appears to be doing better in regard to his lower [name] ulcer. He's been tolerating the dressing changes without complication. Fortunately there does not appear to be any evidence of  infection at this time. No fevers, chills, nausea, or vomiting noted at this time. 06/19/18 evaluation today patient's ulcer on the lower extremity appears to be doing better at this point. he has been tolerating the dressing changes. The patient seems to be somewhat depressed about the progress of his wound although the overall appearance at this time seems to be doing well. In general I'm very pleased with the appearance today he does have some biofilm on the surface of the wound as well as of hyper granular tissue we may want to utilize a little bit Anthony Barnes, VILLALVA ([id number]) [id number] Page 8 of 13 of silver nitrate today. 06/27/2018; patient comes into clinic today for a wound on the right lateral calf likely related to chronic venous insufficiency. He has been using medihoney covered with Hydrofera Blue. Wound surface actually looks quite good 07/03/2018 seen today for follow-up and management of lower extremity ulcer right lateral shin. T olerating dressing changes. He has a small amount of biofilm today. Will attempt to remove a portion of the biofilm as he tolerates. He becomes very anxious with wound treatments. He would benefit from layer compression wraps however he refuses compression wraps or anything that smokes his legs. He expressed an inability to tolerate compression wraps. Juxta lite wraps have been ordered. He has been instructed on the appropriate application of the juxta leg wraps. His blood pressure today on visit was 200/120. He denies any blurred vision, chest pain, dizziness, shortness of breath, or difficulty with mobility. Mr. Mayorquin stated that he had a recent visit with his primary care provider. At that time his carvedilol was decreased from 25 mg daily to 12.5 mg daily. Strongly encouraged Mr. Durfee to seek medical attention today during visit. He declined transport for evaluation of elevated blood pressure. Encouraged him to contact  his primary care provider today for medication management. He stated that he would call his primary care doctor after wound visit today. Also encouraged him that if he experienced any symptoms of blurred vision, chest pain, shortness of breath to immediately seek medical attention. 07/17/18 on evaluation today patient's wound actually does seem to show signs of improvement based on the overall  appearance of the wound bed today. There does not appear to show any signs of infection which is good news. There is no overall worsening which is also good news. He still has hyper granular tissue will use in the [employer] followed by [facility] Dressing this point. 07/31/18 on evaluation today patient's wound bed actually show signs of improvement with good epithelialization especially in the upper portion of the wound. He has been tolerating the dressing changes without complication in general. Overall I'm extremely happy with how things stand. No fevers, chills, nausea, or vomiting noted at this time. 08/28/18 on evaluation today patient appears to be doing a little better in regard to his lower extremity ulcer. With that being said it appears to be very hyper granular I think this is directly attributed to the fact that he continues to use the [employer] underneath the [facility] Dressing. Although this helps not to stick unfortunately also think he's not getting the benefit of the [facility] Dressing particular and subsequently is causing too much moisture buildup hence the hyper granulation. Fortunately there does not appear to be any signs of infection at this time which is good news. 09/03/18 on evaluation today patient appears to be doing well in regard to his lower [name] ulcer. He has been tolerating the dressing changes without complication. Fortunately he has much less hyper granular tissue the noted last week I do think using silver nitrate in changing to using the [facility] Dressing  without the mepitel has made a big difference for him this is good news. 09/18/18 on evaluation today patient appears to be doing excellent in regard to his right lower [name] ulcer. This is actually significantly smaller even compared to the last evaluation. Overall I'm very pleased in this regard. I'm a recommend that we likely repeat the silver nitrate today based on what I'm seeing. 10/02/18 on evaluation today patient appears to be doing excellent in regard to his lower extremity wound on the right. He's been tolerating the dressing changes without complication. Fortunately there's no signs of infection at this time. Overall been very pleased with how things seem to be progressing. No fevers, chills, nausea, or vomiting noted at this time. 10/16/18 on evaluation today patient actually appears to be doing much better in regard to his lower extremity wound on the right. This is shown signs of good improvement and is indeed measuring smaller he is on a excellent track as far as healing is concerned. My hope is this will be healed of the next several weeks. Fortunately there is no evidence of infection at this time. He does seem to be doing everything that I'm recommending for him 10/30/18 on evaluation today patient appears to be doing better in regard to his lower extremity ulcer. He's been tolerating the dressing changes without complication. Fortunately the Hydrofera Blue Dressing to be doing the job. He has made excellent progress. With that being said this is still going somewhat slow but nonetheless is always making good progress at this point. 5/18-Patient was in to be seen for right leg ulcer that appeared after scab above it was removed today. This area had healed completely. It appears that no further action is required on this READMISSION 01/02/2022 This is a now 66 year old male with poorly controlled type 2 diabetes mellitus and chronic venous insufficiency who once again was performing lawn  care when a rock was flung up by his weedeater and it struck him in the right lateral lower leg. The wound  has not healed though it has been nearly 2 months since the injury occurred. He was seen in the emergency department at [facility] on May 9. At that time it was very painful and he described it as a "15 on a scale of 0oo10". He also was found to have 3+ pitting edema to the bilateral lower extremities. He was prescribed a weeks course of Keflex. He is here today because the wound has failed to heal and he has a prior history in our clinic. ABI in clinic today was noncompressible. He did have formal vascular studies performed in 2019 which were normal. The last hemoglobin A1c I have available for review was from March 21, 2021 and was elevated at 7.7%. The wound is fairly small and circular located about 3 inches above his right lateral malleolus. It is completely covered with eschar. No surrounding erythema, no odor, no purulent drainage. 01/11/2022: The wound on his right lateral leg is a little bit smaller today. It has reaccumulated some slough and a small bit of eschar. It remains fairly painful. He has another area on his anterior tibia that he will not let me touch but I am concerned that there is a wound forming underneath the surface. 01/18/2022: Unfortunately, his wound is a little bit bigger today. He continues to accumulate slough on the wound surface. It is still quite tender. 01/25/2022: The patient bumped his wound on the car door which resulted in bleeding. He was seen at urgent care where it was redressed. Unfortunately, the wound is bigger again today. There is accumulated slough on the surface. Urgent care gave him some tramadol, apparently and he took this prior to his visit so he could tolerate debridement better. 02/02/2022: The wound is a little bit smaller today. The surface, however, has accumulated a fairly thick layer of slough. The periwound skin is intact and there is no  obvious sign of infection. 02/07/2022: The wound is a bit larger today. It has thick slough accumulation. The periwound skin is intact and there is no obvious erythema, induration, nor any odor. 02/16/2022: I took a culture last week due to the appearance of the wound and the patient's degree of pain. This grew out methicillin sensitive Staph aureus. Augmentin was prescribed. The patient states that the pain is markedly improved today. The wound also looks much better. It is smaller with less slough buildup and there is good granulation tissue emerging. 02/21/2022: The patient's pain is quite improved from prior visits. The wound is about the same size and has some accumulated slough on the surface. The base is a little bit fibrotic but there are some areas of granulation tissue filling in. 02/28/2022: The wound is a little bit narrower on its measurements but slightly longer. There is still some slough accumulation on the surface, but he has minimal pain and there is no concern for ongoing infection. Granulation tissue is emerging. 03/07/2022: During the week, he cut holes in his wrap because it was painful and too tight. As a result, his leg was quite a bit more swollen today and his wound was larger. The wound surface is fairly clean but a little bit dry. Minimal slough accumulation. Granulation tissue present. 03/14/2022: Once again, he did some arts and crafts projects with his wrap. On the other hand, however, his wound does look better. It is smaller today and is beginning to fill in. Minimal slough and a small amount of eschar accumulation. Anthony Barnes, Anthony Barnes ([id number]) [id number] Page 9 of  13 03/27/2022: His wound measured bigger today, although it does not appear to be so on my impression. The wound continues to fill with granulation tissue. There is a little bit of slough and eschar accumulation. He continues to cut his wrap off of his feet and his edema control at the  ankle is particularly poor with 3+ pitting edema. His insurance will not cover any sort of skin substitute unfortunately. 04/04/2022: The wound is smaller today. It is more superficial with good granulation tissue and just a little bit of slough. He continues to cut away portions of his wrap which results in inadequate edema control. 04/11/2022: His wound measured a couple millimeters larger today. It continues to fill with granulation tissue and there is minimal slough on the surface. 04/19/2022: No significant change in the wound dimensions, but the surface is healthier and less fibrotic. 04/26/2022: The wound is a little bit bigger again today. There is slough accumulation on the surface. Edema control is inadequate. He has been cutting the foot portion of his wrap off each week. 05/03/2022: The wound is a little smaller this week. There is still a fair amount of slough on the surface. His drainage was a little bit as greenish today, although not the blue-green typically associated with Pseudomonas. No significant odor. The underlying granulation tissue appears healthy. The culture that I took last week grew out methicillin sensitive Staph aureus. 05/10/2022: The wound is again a little bit smaller this week. He still has slough accumulation on the surface. Most of the surface has fairly decent granulation tissue, but there is still a fibrotic portion in the center near the caudal aspect of the wound. He did not pick up the Augmentin that I prescribed for his MSSA infection. He says he will get it today. 05/19/2022: His wound is smaller by half a centimeter today. There is a little bit of slough buildup, but less so than at previous visits. He has not been taking his Augmentin properly. He has been splitting the tablets in half and taking them on an irregular schedule. He says this is because it has been upsetting his stomach. 05/25/2022: His wound is a little bit smaller again this week. Very minimal  slough accumulation. The wound is drier than I would like to see. He completed his course of Augmentin. 10/19; patient has a wound on the right lateral lower leg in the setting of obvious chronic venous insufficiency. He has had a previous wound on the anterior leg that we dealt with several years ago. He has been using Hydrofera Blue. Kerlix and Coban 06/08/2022: The wound is smaller again this week. There is some light slough accumulation. It remains a little dry. 06/16/2022: Although the wound dimensions were the same this week, on visual inspection it appears smaller. It is clean, but still dry despite the use of hydrogel. 06/22/2022: His wound responded very well to endoform. It is smaller, moisture balance is excellent, and the surface has lovely granulation tissue. 06/29/2022: His wound is smaller again this week. The granulation tissue is a little bit hypertrophic. No concern for infection. 07/13/2022: His wound continues to improve. The open area is much smaller, but there is still some hypertrophic granulation tissue present. 07/21/2022: The wound is covered by a thick dark scab. Once this was removed, the majority of the wound was found to be healed, with just a couple of open areas with some slough on them. Patient History Information obtained from Patient. Family History Cancer - Mother, Diabetes -  Mother,Father, Hypertension - Mother,Father, Kidney Disease - Siblings, Stroke - Father, No family history of Heart Disease, Hereditary Spherocytosis, Lung Disease, Seizures, Thyroid Problems, Tuberculosis. Social History Former smoker - ended on 08/14/1990, Marital Status - Married, Alcohol Use - Moderate, Drug Use - No History, Caffeine Use - Daily. Medical History Eyes Denies history of Cataracts, Glaucoma, Optic Neuritis Ear/Nose/Mouth/Throat Denies history of Chronic sinus problems/congestion, Middle ear problems Hematologic/Lymphatic Denies history of Anemia, Hemophilia, Human  Immunodeficiency Virus, Lymphedema, Sickle Cell Disease Respiratory Denies history of Aspiration, Asthma, Chronic Obstructive Pulmonary Disease (COPD), Pneumothorax, Sleep Apnea, Tuberculosis Cardiovascular Patient has history of Hypertension Denies history of Angina, Arrhythmia, Congestive Heart Failure, Coronary Artery Disease, Deep Vein Thrombosis, Hypotension, Myocardial Infarction, Peripheral Arterial Disease, Peripheral Venous Disease, Phlebitis, Vasculitis Gastrointestinal Denies history of Cirrhosis , Colitis, Crohnoos, Hepatitis A, Hepatitis B, Hepatitis C Endocrine Patient has history of Type II Diabetes Genitourinary Denies history of End Stage Renal Disease Immunological Denies history of Lupus Erythematosus, Raynaudoos, Scleroderma Integumentary (Skin) Denies history of History of Burn Musculoskeletal Denies history of Gout, Rheumatoid Arthritis, Osteoarthritis, Osteomyelitis Neurologic Denies history of Dementia, Neuropathy, Quadriplegia, Paraplegia, Seizure Disorder Oncologic Denies history of Received Chemotherapy, Received Radiation Psychiatric Denies history of Anorexia/bulimia, Confinement Anxiety Hospitalization/Surgery History - esophagogastroduodenoscopy. - brain aneurysm surgery. Anthony Barnes, Anthony Barnes ([id number]) [id number] Page 10 of 13 Medical A Surgical History Notes nd Constitutional Symptoms (General Health) obesity Gastrointestinal diverticulitis Neurologic neuropathy Objective Constitutional He is hypertensive, but asymptomatic.Marland Kitchen No acute distress. Vitals Time Taken: 8:24 AM, Height: 75 in, Weight: 310 lbs, BMI: 38.7, Temperature: 97.8 F, Pulse: 74 bpm, Respiratory Rate: 18 breaths/min, Blood Pressure: 182/105 mmHg. General Notes: pt just took his BP meds before coming in Respiratory Normal work of breathing on room air. General Notes: 07/21/2022: The wound is covered by a thick dark scab. Once this was removed, the  majority of the wound was found to be healed, with just a couple of open areas with some slough on them. Integumentary (Hair, Skin) Wound #2 status is Open. Original cause of wound was Trauma. The date acquired was: 12/03/2021. The wound has been in treatment 28 weeks. The wound is located on the Right,Lateral Lower Leg. The wound measures 0.5cm length x 0.5cm width x 0.1cm depth; 0.196cm^2 area and 0.02cm^3 volume. There is Fat Layer (Subcutaneous Tissue) exposed. There is no tunneling or undermining noted. There is a medium amount of serosanguineous drainage noted. The wound margin is distinct with the outline attached to the wound base. There is small (1-33%) red granulation within the wound bed. There is a large (67-100%) amount of necrotic tissue within the wound bed including Eschar. The periwound skin appearance had no abnormalities noted for texture. The periwound skin appearance had no abnormalities noted for color. The periwound skin appearance exhibited: Dry/Scaly. Periwound temperature was noted as No Abnormality. Assessment Active Problems ICD-10 Non-pressure chronic ulcer of other part of right lower leg with fat layer exposed Type 2 diabetes mellitus with other skin ulcer Type 2 diabetes mellitus without complications Essential (primary) hypertension Venous insufficiency (chronic) (peripheral) Procedures Wound #2 Pre-procedure diagnosis of Wound #2 is a Venous Leg Ulcer located on the Right,Lateral Lower Leg .Severity of Tissue Pre Debridement is: Fat layer exposed. There was a Selective/Open Wound Non-Viable Tissue Debridement with a total area of 1 sq cm performed by Duanne Guess, MD. With the following instrument(s): Curette to remove Non-Viable tissue/material. Material removed includes Eschar and Slough and after achieving pain control using Lidocaine 5% topical ointment. No specimens were taken.  A time out was conducted at 08:35, prior to the start of the procedure. A  Minimum amount of bleeding was controlled with Pressure. The procedure was tolerated well. Post Debridement Measurements: 0.5cm length x 0.5cm width x 0.1cm depth; 0.02cm^3 volume. Character of Wound/Ulcer Post Debridement is improved. Severity of Tissue Post Debridement is: Fat layer exposed. Post procedure Diagnosis Wound #2: Same as Pre-Procedure General Notes: scribed for Dr. Lady Gary by Samuella Bruin, RN. Pre-procedure diagnosis of Wound #2 is a Venous Leg Ulcer located on the Right,Lateral Lower Leg . There was a Three Layer Compression Therapy Procedure by Samuella Bruin, RN. Post procedure Diagnosis Wound #2: Same as Pre-Procedure Plan Follow-up Appointments: Return Appointment in 1 week. - Dr. Lady Gary - room 2 Anesthetic: Anthony Barnes, Anthony Barnes ([id number]) [id number] Page 11 of 13 Wound #2 Right,Lateral Lower Leg: (In clinic) Topical Lidocaine 5% applied to wound bed Bathing/ Shower/ Hygiene: May shower with protection but do not get wound dressing(s) wet. - may purchase a cast protector from [facility] or [facility] Edema Control - Lymphedema / SCD / Other: Elevate legs to the level of the heart or above for 30 minutes daily and/or when sitting, a frequency of: Avoid standing for long periods of time. Patient to wear own compression stockings every day. Exercise regularly Moisturize legs daily. Compression stocking or Garment 20-30 mm/Hg pressure to: - to L left until R leg is healed The following medication(s) was prescribed: lidocaine topical 5 % ointment ointment topical was prescribed at facility WOUND #2: - Lower Leg Wound Laterality: Right, Lateral Cleanser: Soap and Water 1 x Per Week/30 Days Discharge Instructions: May shower and wash wound with dial antibacterial soap and water prior to dressing change. Cleanser: Wound Cleanser 1 x Per Week/30 Days Discharge Instructions: Cleanse the wound with wound cleanser prior to applying a clean dressing  using gauze sponges, not tissue or cotton balls. Peri-Wound Care: Triamcinolone 15 (g) 1 x Per Week/30 Days Discharge Instructions: Use triamcinolone 15 (g) as directed Peri-Wound Care: Sween Lotion (Moisturizing lotion) 1 x Per Week/30 Days Discharge Instructions: Apply moisturizing lotion as directed Prim Dressing: Endoform 2x2 in 1 x Per Week/30 Days ary Discharge Instructions: Moisten with saline Secondary Dressing: Woven Gauze Sponge, Non-Sterile 4x4 in 1 x Per Week/30 Days Discharge Instructions: Apply over primary dressing as directed. Secured With: 90M Medipore H Soft Cloth Surgical T ape, 4 x 10 (in/yd) 1 x Per Week/30 Days Discharge Instructions: Secure with tape as directed. Com pression Wrap: ThreePress (3 layer compression wrap) 1 x Per Week/30 Days Discharge Instructions: Apply three layer compression as directed. 07/21/2022: The wound is covered by a thick dark scab. Once this was removed, the majority of the wound was found to be healed, with just a couple of open areas with some slough on them. I used a curette to debride the scab/eschar off of the wound, as well as the slough. He is very close to healing. We will continue to use endoform and 3 layer compression. Follow-up in 1 week. Electronic Signature(s) Signed: 07/21/2022 12:03:59 PM By: Duanne Guess MD FACS Signed: 07/21/2022 4:13:22 PM By: Samuella Bruin Previous Signature: 07/21/2022 8:59:20 AM Version By: Duanne Guess MD FACS Entered By: Samuella Bruin on 07/21/2022 10:20:19 -------------------------------------------------------------------------------- HxROS Details Patient Name: Date of Service: Anthony Barnes, Anthony [name] 07/21/2022 8:30 A M Medical Record Number: [id number] Patient Account Number: [id number] Date of Birth/Sex: Treating RN: 03/02/1956 (66 y.o. M) Primary Care Provider: Hoy Register Other Clinician: Referring Provider: Treating Provider/Extender: Whitney Muse,  [place]ong Weeks in Treatment: 28 Information Obtained From Patient Constitutional Symptoms (General Health) Medical History: Past Medical History Notes: obesity Eyes Medical History: Negative for: Cataracts; Glaucoma; Optic Neuritis Ear/Nose/Mouth/Throat Medical History: Negative for: Chronic sinus problems/congestion; Middle ear problems DAVIEN, MALONE ([id number]) [id number] Page 12 of 13 Hematologic/Lymphatic Medical History: Negative for: Anemia; Hemophilia; Human Immunodeficiency Virus; Lymphedema; Sickle Cell Disease Respiratory Medical History: Negative for: Aspiration; Asthma; Chronic Obstructive Pulmonary Disease (COPD); Pneumothorax; Sleep Apnea; Tuberculosis Cardiovascular Medical History: Positive for: Hypertension Negative for: Angina; Arrhythmia; Congestive Heart Failure; Coronary Artery Disease; Deep Vein Thrombosis; Hypotension; Myocardial Infarction; Peripheral Arterial Disease; Peripheral Venous Disease; Phlebitis; Vasculitis Gastrointestinal Medical History: Negative for: Cirrhosis ; Colitis; Crohns; Hepatitis A; Hepatitis B; Hepatitis C Past Medical History Notes: diverticulitis Endocrine Medical History: Positive for: Type II Diabetes Time with diabetes: 8 years Treated with: Oral agents Blood sugar tested every day: No Genitourinary Medical History: Negative for: End Stage Renal Disease Immunological Medical History: Negative for: Lupus Erythematosus; Raynauds; Scleroderma Integumentary (Skin) Medical History: Negative for: History of Burn Musculoskeletal Medical History: Negative for: Gout; Rheumatoid Arthritis; Osteoarthritis; Osteomyelitis Neurologic Medical History: Negative for: Dementia; Neuropathy; Quadriplegia; Paraplegia; Seizure Disorder Past Medical History Notes: neuropathy Oncologic Medical History: Negative for: Received Chemotherapy; Received Radiation Psychiatric Medical History: Negative  for: Anorexia/bulimia; Confinement Anxiety Immunizations Pneumococcal Vaccine: Received Pneumococcal Vaccination: No Immunization Notes: tetanus 2 years ago Implantable [address] KEON, PENDER ([id number]) [id number] Page 13 of 13 None Hospitalization / Surgery History Type of Hospitalization/Surgery esophagogastroduodenoscopy brain aneurysm surgery Family and Social History Cancer: Yes - Mother; Diabetes: Yes - Mother,Father; Heart Disease: No; Hereditary Spherocytosis: No; Hypertension: Yes - Mother,Father; Kidney Disease: Yes - Siblings; Lung Disease: No; Seizures: No; Stroke: Yes - Father; Thyroid Problems: No; Tuberculosis: No; Former smoker - ended on 08/14/1990; Marital Status - Married; Alcohol Use: Moderate; Drug Use: No History; Caffeine Use: Daily; Financial Concerns: No; Food, Clothing or Shelter Needs: No; Support System Lacking: No; Transportation Concerns: No Electronic Signature(s) Signed: 07/21/2022 8:59:48 AM By: Duanne Guess MD FACS Entered By: Duanne Guess on 07/21/2022 08:57:46 -------------------------------------------------------------------------------- SuperBill Details Patient Name: Date of Service: Anthony Barnes, Anthony [name] 07/21/2022 Medical Record Number: [id number] Patient Account Number: [id number] Date of Birth/Sex: Treating RN: 06-02-1956 (66 y.o. M) Primary Care Provider: Hoy Register Other Clinician: Referring Provider: Treating Provider/Extender: Camillo Flaming Weeks in Treatment: 28 Diagnosis Coding ICD-10 Codes Code Description [phone] Non-pressure chronic ulcer of other part of right lower leg with fat layer exposed E11.622 Type 2 diabetes mellitus with other skin ulcer E11.9 Type 2 diabetes mellitus without complications I10 Essential (primary) hypertension I87.2 Venous insufficiency (chronic) (peripheral) Facility Procedures : CPT4 Code: [id number] Description: 97597 - DEBRIDE WOUND  1ST 20 SQ CM OR < ICD-10 Diagnosis Description 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os Modifier: ed Quantity: 1 Physician Procedures : CPT4 Code Description Modifier [id number] 99213 - WC PHYS LEVEL 3 - EST PT 25 ICD-10 Diagnosis Description 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osed E11.622 Type 2 diabetes mellitus with other skin ulcer  I10 Essential (primary) hypertension I87.2 Venous insufficiency (chronic) (peripheral) Quantity: 1 : [id number] 97597 - WC PHYS DEBR WO ANESTH 20 SQ CM ICD-10 Diagnosis Description 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osed Quantity: 1 Electronic Signature(s) Signed: 07/21/2022 8:59:38 AM By: Duanne Guess MD FACS Entered By: Duanne Guess on 07/21/2022 08:59:37

## 2022-07-24 ENCOUNTER — Other Ambulatory Visit: Payer: Self-pay | Admitting: Family Medicine

## 2022-07-24 DIAGNOSIS — E1149 Type 2 diabetes mellitus with other diabetic neurological complication: Secondary | ICD-10-CM

## 2022-07-24 DIAGNOSIS — I152 Hypertension secondary to endocrine disorders: Secondary | ICD-10-CM

## 2022-07-24 NOTE — Telephone Encounter (Unsigned)
Copied from CRM [id number]. Topic: General - Other >> Jul 24, 2022  1:15 PM Everette C wrote: Reason for CRM: Medication Refill - Medication: carvedilol (COREG) 25 MG tablet [[id number]]  lisinopril-hydrochlorothiazide (ZESTORETIC) 20-12.5 MG tablet [[id number]]  glipiZIDE (GLUCOTROL) 10 MG tablet [[id number]]  cloNIDine (CATAPRES) 0.3 MG tablet [[id number]]  amLODipine (NORVASC) 10 MG tablet [[id number]]  Has the patient contacted their pharmacy? Yes.   (Agent: If no, request that the patient contact the pharmacy for the refill. If patient does not wish to contact the pharmacy document the reason why and proceed with request.) (Agent: If yes, when and what did the pharmacy advise?)  Preferred Pharmacy (with phone number or street name): [facility] [id number], [place] - [address] [place] [address] Phone: [phone] Fax: [phone] Hours: Not open 24 hours   Has the patient been seen for an appointment in the last year OR does the patient have an upcoming appointment? Yes.    Agent: Please be advised that RX refills may take up to 3 business days. We ask that you follow-up with your pharmacy.

## 2022-07-25 MED ORDER — CARVEDILOL 25 MG PO TABS: 25.0000 mg | ORAL_TABLET | Freq: Two times a day (BID) | ORAL | 1 refills | Status: DC

## 2022-07-25 MED ORDER — AMLODIPINE BESYLATE 10 MG PO TABS: 10.0000 mg | ORAL_TABLET | Freq: Every day | ORAL | 1 refills | Status: DC

## 2022-07-25 NOTE — Telephone Encounter (Signed)
All medications listed refilled 07/25/22.

## 2022-07-27 ENCOUNTER — Encounter (HOSPITAL_BASED_OUTPATIENT_CLINIC_OR_DEPARTMENT_OTHER): Payer: Medicare Other | Admitting: Internal Medicine

## 2022-07-27 DIAGNOSIS — I872 Venous insufficiency (chronic) (peripheral): Secondary | ICD-10-CM | POA: Diagnosis not present

## 2022-07-27 DIAGNOSIS — L97812 Non-pressure chronic ulcer of other part of right lower leg with fat layer exposed: Secondary | ICD-10-CM | POA: Diagnosis not present

## 2022-07-27 NOTE — Progress Notes (Signed)
Anthony Barnes ([id number]) [id number] Page 1 of 9 Visit Report for 07/27/2022 HPI Details Patient Name: Date of Service: Anthony Barnes [name] 07/27/2022 9:30 A M Medical Record Number: [id number] Patient Account Number: [id number] Date of Birth/Sex: Treating RN: 08/01/1956 (66 y.o. M) Primary Care Provider: Hoy Barnes Other Clinician: Referring Provider: Treating Provider/Extender: Anthony Barnes in Treatment: 29 History of Present Illness HPI Description: ADMISSION 03/12/18 This is a 66 year old and it was a type II diabetic reasonably poorly controlled with a recent hemoglobin A1c of 8.2. He was tending to his lawn on 01/15/18 when his weed Anthony Barnes sent a rock up word hitting him in the right anterior leg. He was seen in his primary M.D. office is same time. An x-ray showed no abnormality. He was given a course of cephalexin. Since then he's been applying peroxide and topical antibiotics to the wound. He does not have a history of chronic wounds however he does have chronic edema in the lower legs. He is complaining of pain in the right leg wound but no real history of claudication. Past medical history includes type 2 diabetes, hypertension, morbid obesity. ABIs in the right leg were noncompressible 03/19/18 on evaluation today patient appears to be tolerating the wrap in general fairly well. He states he's not having that much pain in regard to the wound itself. With that being said he is having some issues with slough buildup on the surface of the wound the Iodoflex does seem to be helpful in that regard. He is still having discomfort he wonders if I can send in a prescription for ibuprofen or something of like to help him out. I do believe that the prox end may be of benefit for him. 03/25/18 on evaluation today patient appears to be doing very well in regard to his right lateral lower [name]. He's been  tolerating the dressing changes without complication that is the wraps. Nonetheless he does continue to have pain he states is been dreading the possibility of having to have debridement again today. His first debridement experience was not optimal. With that being said he does seem to be showing signs of improvement there does appear to be little bit more granulation he does have a lot of slough that really does need to breeding away. 04/04/18;the patient went for arterial studies that were really quite normal. ABI on the right at 1.19 with triphasic waveforms on the left 1.11 with triphasic waveforms. TBI 0.93 on the right and 0.95 on the left. This suggests T should be able to tolerate Anthony Barnes 4 layer compression. He is however complaining of a lot of pain with our wraps I'm wondering whether the pain is simply Iodoflex. I changed him to [employer] I'm still going to try to keep him in 3 layer compression 04/12/18 on evaluation today patient actually appears to be doing rather well in regard to his right lower [name] ulcer. He is making good progress although it is slow still I do believe that the Santyl is much better than the Iodoflex. The good news is the patient seems to be tolerating the dressing changes without complication now that we've gotten good of the Iodoflex which really did burn him quite significantly. Otherwise there's no evidence of infection. 04/17/18 on evaluation today patient actually appears to be showing signs of improvement in regard to the ulcer. Unfortunately he's had somewhat of a rough day due to the fact that he found out that  one of his friends suddenly and unexpectedly passed today. He states he's not sure he's in the frame of mind to allow me to debride the wound at this point. Nonetheless I do feel like he is showing signs of the wound getting better little by little each week. 04/24/18 on evaluation today patient's ulcer actually appears to be showing  some signs of improvement albeit slow. He has been tolerating the dressing changes without complication except for the wrap which she states was so tight that he had to remove it it was causing a lot of discomfort. Fortunately there is no evidence of infection at this point. He states he only wore the wrap until about the time he got home last week and that he had to take it off. Nonetheless he does not have any other compression stockings, Juxta-Lite wraps, or anything otherwise to really help at this point as far as compression is concerned. 05/01/18 on evaluation today patient actually appears to be doing fairly well in regard to his lower extremity ulcer. He has been tolerating the dressing changes currently without complication. The wrap does seem to be helping as far as the fluid management is concerned. Wound bed itself actually show signs of good improvement although there is some Slough noted there's not as much as previous and he actually has fairly decent granulation noted as well. Overall I'm pleased with the progress of to this point. The patient would prefer not to have any debridement today he states he's actually not feeling too well in general and he really does not want any additional discomfort typically debridement is fairly uncomfortable for him. 05/08/18 on evaluation today patient actually appears to be doing a little better in regard to his lower extremity ulcer. He has been tolerating the dressing changes without complication. With that being said I'm actually quite pleased with the fact the wound is not appear to be infected and in general has made a little progress. With that being said I do believe we need to perform some debridement to clean away the necrotic tissue on the surface of the wound. 05/15/18 on evaluation today patient actually appears to be doing a little better in regard to his wound. Fortunately there is some improvement noted fortunately there's also no  significant evidence of infection at this point in time. He does have continued pain that really nothing different than previous he did get his Juxta-Lite wrap. 05/29/18 on evaluation today patient actually appears to be doing somewhat better in regard to his wound currently. He's been tolerating the dressing changes without complication. With that being said he has been performing the [facility] Dressing changes every day at home. I'm pretty sure that we told him every other day last week or when I saw him rather two Barnes ago. With that being said obviously did not hurt to do it daily just that obviously is gonna run out of supplies sooner and that could get him into trouble as far as his insurance is concerned. Nonetheless he at this point still continues to have pain although honestly I don't feel like it's as bad as what it was in the past just based on his reactions at this point. 06/12/18 on evaluation today patient actually appears to be doing better in regard to his lower [name] ulcer. He's been tolerating the dressing changes without complication. Fortunately there does not appear to be any evidence of infection at this time. No fevers, chills, nausea, or vomiting noted at this time.  06/19/18 evaluation today patient's ulcer on the lower extremity appears to be doing better at this point. he has been tolerating the dressing changes. The patient seems to be somewhat depressed about the progress of his wound although the overall appearance at this time seems to be doing well. In general I'm very pleased with the appearance today he does have some biofilm on the surface of the wound as well as of hyper granular tissue we may want to utilize a little bit Anthony Barnes, Anthony Barnes ([id number]) [id number] Page 2 of 9 of silver nitrate today. 06/27/2018; patient comes into clinic today for a wound on the right lateral calf likely related to chronic venous insufficiency. He  has been using medihoney covered with Hydrofera Blue. Wound surface actually looks quite good 07/03/2018 seen today for follow-up and management of lower extremity ulcer right lateral shin. T olerating dressing changes. He has a small amount of biofilm today. Will attempt to remove a portion of the biofilm as he tolerates. He becomes very anxious with wound treatments. He would benefit from layer compression wraps however he refuses compression wraps or anything that smokes his legs. He expressed an inability to tolerate compression wraps. Juxta lite wraps have been ordered. He has been instructed on the appropriate application of the juxta leg wraps. His blood pressure today on visit was 200/120. He denies any blurred vision, chest pain, dizziness, shortness of breath, or difficulty with mobility. Mr. Stanek stated that he had a recent visit with his primary care provider. At that time his carvedilol was decreased from 25 mg daily to 12.5 mg daily. Strongly encouraged Mr. Cohick to seek medical attention today during visit. He declined transport for evaluation of elevated blood pressure. Encouraged him to contact his primary care provider today for medication management. He stated that he would call his primary care doctor after wound visit today. Also encouraged him that if he experienced any symptoms of blurred vision, chest pain, shortness of breath to immediately seek medical attention. 07/17/18 on evaluation today patient's wound actually does seem to show signs of improvement based on the overall appearance of the wound bed today. There does not appear to show any signs of infection which is good news. There is no overall worsening which is also good news. He still has hyper granular tissue will use in the [employer] followed by [facility] Dressing this point. 07/31/18 on evaluation today patient's wound bed actually show signs of improvement with good epithelialization especially in the upper  portion of the wound. He has been tolerating the dressing changes without complication in general. Overall I'm extremely happy with how things stand. No fevers, chills, nausea, or vomiting noted at this time. 08/28/18 on evaluation today patient appears to be doing a little better in regard to his lower extremity ulcer. With that being said it appears to be very hyper granular I think this is directly attributed to the fact that he continues to use the [employer] underneath the [facility] Dressing. Although this helps not to stick unfortunately also think he's not getting the benefit of the [facility] Dressing particular and subsequently is causing too much moisture buildup hence the hyper granulation. Fortunately there does not appear to be any signs of infection at this time which is good news. 09/03/18 on evaluation today patient appears to be doing well in regard to his lower [name] ulcer. He has been tolerating the dressing changes without complication. Fortunately he has much less hyper granular tissue the noted last  week I do think using silver nitrate in changing to using the [facility] without the mepitel has made a big difference for him this is good news. 09/18/18 on evaluation today patient appears to be doing excellent in regard to his right lower [name] ulcer. This is actually significantly smaller Anthony Barnes compared to the last evaluation. Overall I'm very pleased in this regard. I'm a recommend that we likely repeat the silver nitrate today based on what I'm seeing. 10/02/18 on evaluation today patient appears to be doing excellent in regard to his lower extremity wound on the right. He's been tolerating the dressing changes without complication. Fortunately there's no signs of infection at this time. Overall been very pleased with how things seem to be progressing. No fevers, chills, nausea, or vomiting noted at this time. 10/16/18 on evaluation today patient actually appears  to be doing much better in regard to his lower extremity wound on the right. This is shown signs of good improvement and is indeed measuring smaller he is on a excellent track as far as healing is concerned. My hope is this will be healed of the next several Barnes. Fortunately there is no evidence of infection at this time. He does seem to be doing everything that I'm recommending for him 10/30/18 on evaluation today patient appears to be doing better in regard to his lower extremity ulcer. He's been tolerating the dressing changes without complication. Fortunately the Hydrofera Blue Dressing to be doing the job. He has made excellent progress. With that being said this is still going somewhat slow but nonetheless is always making good progress at this point. 5/18-Patient was in to be seen for right leg ulcer that appeared after scab above it was removed today. This area had healed completely. 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 The wound has not healed though it has been nearly 2 months since the injury occurred. He was seen in the emergency department at [facility] on May [facility]. At that time it was very painful and he described it as a "15 on a scale of 010". He also was found to have 3+ pitting edema to the bilateral lower extremities. He was prescribed a Barnes course of Keflex. He is here today because the wound has failed to heal and he has a prior history in our clinic. ABI in clinic today was noncompressible. He did have formal vascular studies performed in 2019 which were normal. The last hemoglobin A1c I have available for review was from March 21, 2021 and was elevated at 7.7%. The wound is fairly small and circular  located about 3 inches above his right lateral malleolus. It is completely covered with eschar. No surrounding erythema, no odor, no purulent drainage. 01/11/2022: The wound on his right lateral leg is a little bit smaller today. It has reaccumulated some slough and a small bit of eschar. It remains fairly painful. He has another area on his anterior tibia that he will not let me touch but I am concerned that there is a wound forming underneath the surface. 01/18/2022: Unfortunately, his wound is a little bit bigger today. He continues to accumulate slough on the wound surface. It is still quite tender. 01/25/2022: The  patient bumped his wound on the car door which resulted in bleeding. He was seen at urgent care where it was redressed. Unfortunately, the wound is bigger again today. There is accumulated slough on the surface. Urgent care gave him some tramadol, apparently and he took this prior to his visit so he could tolerate debridement better. 02/02/2022: The wound is a little bit smaller today. The surface, however, has accumulated a fairly thick layer of slough. The periwound skin is intact and there is no obvious sign of infection. 02/07/2022: The wound is a bit larger today. It has thick slough accumulation. The periwound skin is intact and there is no obvious erythema, induration, nor any odor. 02/16/2022: I took a culture last week due to the appearance of the wound and the patient's degree of pain. This grew out methicillin sensitive Staph aureus. Augmentin was prescribed. The patient states that the pain is markedly improved today. The wound also looks much better. It is smaller with less slough buildup and there is good granulation tissue emerging. 02/21/2022: The patient's pain is quite improved from prior visits. The wound is about the same size and has some accumulated slough on the surface. The base is a little bit fibrotic but there are some areas of granulation tissue filling  in. 02/28/2022: The wound is a little bit narrower on its measurements but slightly longer. There is still some slough accumulation on the surface, but he has minimal pain and there is no concern for ongoing infection. Granulation tissue is emerging. 03/07/2022: During the week, he cut holes in his wrap because it was painful and too tight. As a result, his leg was quite a bit more swollen today and his wound was larger. The wound surface is fairly clean but a little bit dry. Minimal slough accumulation. Granulation tissue present. 03/14/2022: Once again, he did some arts and crafts projects with his wrap. On the other hand, however, his wound does look better. It is smaller today and is beginning to fill in. Minimal slough and a small amount of eschar accumulation. Anthony Barnes, Anthony Barnes ([id number]) [id number] Page 3 of 9 03/27/2022: His wound measured bigger today, although it does not appear to be so on my impression. The wound continues to fill with granulation tissue. There is a little bit of slough and eschar accumulation. He continues to cut his wrap off of his feet and his edema control at the ankle is particularly poor with 3+ pitting edema. His insurance will not cover any sort of skin substitute unfortunately. 04/04/2022: The wound is smaller today. It is more superficial with good granulation tissue and just a little bit of slough. He continues to cut away portions of his wrap which results in inadequate edema control. 04/11/2022: His wound measured a couple millimeters larger today. It continues to fill with granulation tissue and there is minimal slough on the surface. 04/19/2022: No significant change in the wound dimensions, but the surface is healthier and less fibrotic. 04/26/2022: The wound is a little bit bigger again today. There is slough accumulation on the surface. Edema control is inadequate. He has been cutting the foot portion of his wrap off each  week. 05/03/2022: The wound is a little smaller this week. There is still a fair amount of slough on the surface. His drainage was a little bit as greenish today, although not the blue-green typically associated with Pseudomonas. No significant odor. The underlying granulation tissue appears healthy. The culture that I took last week grew out methicillin  sensitive Staph aureus. 05/10/2022: The wound is again a little bit smaller this week. He still has slough accumulation on the surface. Most of the surface has fairly decent granulation tissue, but there is still a fibrotic portion in the center near the caudal aspect of the wound. He did not pick up the Augmentin that I prescribed for his MSSA infection. He says he will get it today. 05/19/2022: His wound is smaller by half a centimeter today. There is a little bit of slough buildup, but less so than at previous visits. He has not been taking his Augmentin properly. He has been splitting the tablets in half and taking them on an irregular schedule. He says this is because it has been upsetting his stomach. 05/25/2022: His wound is a little bit smaller again this week. Very minimal slough accumulation. The wound is drier than I would like to see. He completed his course of Augmentin. 10/19; patient has a wound on the right lateral lower leg in the setting of obvious chronic venous insufficiency. He has had a previous wound on the anterior leg that we dealt with several years ago. He has been using Hydrofera Blue. Kerlix and Coban 06/08/2022: The wound is smaller again this week. There is some light slough accumulation. It remains a little dry. 06/16/2022: Although the wound dimensions were the same this week, on visual inspection it appears smaller. It is clean, but still dry despite the use of hydrogel. 06/22/2022: His wound responded very well to endoform. It is smaller, moisture balance is excellent, and the surface has lovely granulation  tissue. 06/29/2022: His wound is smaller again this week. The granulation tissue is a little bit hypertrophic. No concern for infection. 07/13/2022: His wound continues to improve. The open area is much smaller, but there is still some hypertrophic granulation tissue present. 07/21/2022: The wound is covered by a thick dark scab. Once this was removed, the majority of the wound was found to be healed, with just a couple of open areas with some slough on them. 12/14The patient comes in today with the area still scabbed over. He would not let me touch these. I explained the problem from a providers point of view about scabs on the wounds including the possibility of deep underlying wounds, superficial wounds that have not healed. There is a course of the possibility that everything is epithelialized under scab. He said he understood this but did not want these further debrided this was initially trauma in the setting of chronic venous insufficiency and lymphedema Electronic Signature(s) Signed: 07/27/2022 4:13:03 PM By: Baltazar Najjar MD Entered By: Baltazar Najjar on 07/27/2022 11:25:35 -------------------------------------------------------------------------------- Physical Exam Details Patient Name: Date of Service: Anthony Barnes, Anthony [name] 07/27/2022 9:30 A M Medical Record Number: [id number] Patient Account Number: [id number] Date of Birth/Sex: Treating RN: 06/15/1956 (66 y.o. M) Primary Care Provider: Hoy Barnes Other Clinician: Referring Provider: Treating Provider/Extender: Anthony Barnes in Treatment: 29 Constitutional Patient is hypertensive.. Pulse regular and within target range for patient.Marland Kitchen Respirations regular, non-labored and within target range.. Temperature is normal and within the target range for the patient.Marland Kitchen Appears in no distress. Notes Wound exam; the area is covered by a thick dry scab I note that this was debrided last week with only small  open areas remaining this would suggest that there is still some drainage from these open areas. Nevertheless he would not let me touch this. There is no infection here and no evidence of uncontrolled edema Electronic Signature(s)  Anthony Barnes, Anthony Barnes ([id number]) [id number] Page 4 of 9 Signed: 07/27/2022 4:13:03 PM By: Baltazar Najjar MD Entered By: Baltazar Najjar on 07/27/2022 11:26:50 -------------------------------------------------------------------------------- Physician Orders Details Patient Name: Date of Service: Anthony Barnes, Anthony [name] 07/27/2022 9:30 A M Medical Record Number: [id number] Patient Account Number: [id number] Date of Birth/Sex: Treating RN: 08/04/1956 (66 y.o. [name] Primary Care Provider: Hoy Barnes Other Clinician: Referring Provider: Treating Provider/Extender: Leotis Pain in Treatment: 64 Verbal / Phone Orders: No Diagnosis Coding Follow-up Appointments ppointment in 1 week. - Dr. Lady Gary - room 2 Return A Discharge From [facility] Services Discharge from Wound Care Center - Congratulations!!!!!!!!!!!! Edema Control - Lymphedema / SCD / Other Elevate legs to the level of the heart or above for 30 minutes daily and/or when sitting, a frequency of: Avoid standing for long periods of time. Patient to wear own compression stockings every day. Exercise regularly Moisturize legs daily. Compression stocking or Garment 20-30 mm/Hg pressure to: Electronic Signature(s) Signed: 07/27/2022 4:13:03 PM By: Baltazar Najjar MD Signed: 07/27/2022 4:17:37 PM By: Samuella Bruin Entered By: Samuella Bruin on 07/27/2022 10:21:21 -------------------------------------------------------------------------------- Problem List Details Patient Name: Date of Service: Anthony Barnes, Anthony [name] 07/27/2022 9:30 A M Medical Record Number: [id number] Patient Account Number: [id number] Date of Birth/Sex: Treating  RN: [birth year] (66 y.o. M) Primary Care Provider: Hoy Barnes Other Clinician: Referring Provider: Treating Provider/Extender: Luster Landsberg, Odette Horns Barnes in Treatment: 29 Active Problems ICD-10 Encounter Code Description Active Date MDM Diagnosis L97.812 Non-pressure chronic ulcer of other part of right lower leg with fat layer 01/02/2022 No Yes exposed E11.622 Type 2 diabetes mellitus with other skin ulcer 01/02/2022 No Yes E11.9 Type 2 diabetes mellitus without complications 01/02/2022 No Yes ZIYON, SOLTAU ([id number]) [id number] Page 5 of 9 I10 Essential (primary) hypertension 01/02/2022 No Yes I87.2 Venous insufficiency (chronic) (peripheral) 01/02/2022 No Yes Inactive Problems Resolved Problems Electronic Signature(s) Signed: 07/27/2022 4:13:03 PM By: Baltazar Najjar MD Entered By: Baltazar Najjar on 07/27/2022 11:24:12 -------------------------------------------------------------------------------- Progress Note Details Patient Name: Date of Service: Anthony Barnes, Anthony [name] 07/27/2022 9:30 A M Medical Record Number: [id number] Patient Account Number: [id number] Date of Birth/Sex: Treating RN: 02-07-1956 (66 y.o. M) Primary Care Provider: Hoy Barnes Other Clinician: Referring Provider: Treating Provider/Extender: Anthony Barnes in Treatment: 29 Subjective History of Present Illness (HPI) ADMISSION 03/12/18 This is a 66 year old and it was a type II diabetic reasonably poorly controlled with a recent hemoglobin A1c of 8.2. He was tending to his lawn on 01/15/18 when his weed Anthony Barnes sent a rock up word hitting him in the right anterior leg. He was seen in his primary M.D. office is same time. An x-ray showed no abnormality. He was given a course of cephalexin. Since then he's been applying peroxide and topical antibiotics to the wound. He does not have a history of chronic wounds however he does have chronic  edema in the lower legs. He is complaining of pain in the right leg wound but no real history of claudication. Past medical history includes type 2 diabetes, hypertension, morbid obesity. ABIs in the right leg were noncompressible 03/19/18 on evaluation today patient appears to be tolerating the wrap in general fairly well. He states he's not having that much pain in regard to the wound itself. With that being said he is having some issues with slough buildup on the surface of the wound the Iodoflex does seem to be helpful in that regard. He is still having discomfort he  wonders if I can send in a prescription for ibuprofen or something of like to help him out. I do believe that the prox end may be of benefit for him. 03/25/18 on evaluation today patient appears to be doing very well in regard to his right lateral lower [name]. He's been tolerating the dressing changes without complication that is the wraps. Nonetheless he does continue to have pain he states is been dreading the possibility of having to have debridement again today. His first debridement experience was not optimal. With that being said he does seem to be showing signs of improvement there does appear to be little bit more granulation he does have a lot of slough that really does need to breeding away. 04/04/18;the patient went for arterial studies that were really quite normal. ABI on the right at 1.19 with triphasic waveforms on the left 1.11 with triphasic waveforms. TBI 0.93 on the right and 0.95 on the left. This suggests T should be able to tolerate Anthony Barnes 4 layer compression. He is however complaining of a lot of pain with our wraps I'm wondering whether the pain is simply Iodoflex. I changed him to [employer] I'm still going to try to keep him in 3 layer compression 04/12/18 on evaluation today patient actually appears to be doing rather well in regard to his right lower [name] ulcer. He is  making good progress although it is slow still I do believe that the Santyl is much better than the Iodoflex. The good news is the patient seems to be tolerating the dressing changes without complication now that we've gotten good of the Iodoflex which really did burn him quite significantly. Otherwise there's no evidence of infection. 04/17/18 on evaluation today patient actually appears to be showing signs of improvement in regard to the ulcer. Unfortunately he's had somewhat of a rough day due to the fact that he found out that one of his friends suddenly and unexpectedly passed today. He states he's not sure he's in the frame of mind to allow me to debride the wound at this point. Nonetheless I do feel like he is showing signs of the wound getting better little by little each week. 04/24/18 on evaluation today patient's ulcer actually appears to be showing some signs of improvement albeit slow. He has been tolerating the dressing changes without complication except for the wrap which she states was so tight that he had to remove it it was causing a lot of discomfort. Fortunately there is no evidence of infection at this point. He states he only wore the wrap until about the time he got home last week and that he had to take it off. Nonetheless he does not have any other compression stockings, Juxta-Lite wraps, or anything otherwise to really help at this point as far as compression is concerned. 05/01/18 on evaluation today patient actually appears to be doing fairly well in regard to his lower extremity ulcer. He has been tolerating the dressing changes currently without complication. The wrap does seem to be helping as far as the fluid management is concerned. Wound bed itself actually show signs of good improvement although there is some Slough noted there's not as much as previous and he actually has fairly decent granulation noted as well. Overall I'm pleased with the progress of to this point. The  patient would prefer not to have any debridement today he states he's actually not feeling too well in general and Dollard, Yanni E (  [id number]) [id number] Page 6 of 9 he really does not want any additional discomfort typically debridement is fairly uncomfortable for him. 05/08/18 on evaluation today patient actually appears to be doing a little better in regard to his lower extremity ulcer. He has been tolerating the dressing changes without complication. With that being said I'm actually quite pleased with the fact the wound is not appear to be infected and in general has made a little progress. With that being said I do believe we need to perform some debridement to clean away the necrotic tissue on the surface of the wound. 05/15/18 on evaluation today patient actually appears to be doing a little better in regard to his wound. Fortunately there is some improvement noted fortunately there's also no significant evidence of infection at this point in time. He does have continued pain that really nothing different than previous he did get his Juxta-Lite wrap. 05/29/18 on evaluation today patient actually appears to be doing somewhat better in regard to his wound currently. He's been tolerating the dressing changes without complication. With that being said he has been performing the [facility] Dressing changes every day at home. I'm pretty sure that we told him every other day last week or when I saw him rather two Barnes ago. With that being said obviously did not hurt to do it daily just that obviously is gonna run out of supplies sooner and that could get him into trouble as far as his insurance is concerned. Nonetheless he at this point still continues to have pain although honestly I don't feel like it's as bad as what it was in the past just based on his reactions at this point. 06/12/18 on evaluation today patient actually appears to be doing better in regard to  his lower [name] ulcer. He's been tolerating the dressing changes without complication. Fortunately there does not appear to be any evidence of infection at this time. No fevers, chills, nausea, or vomiting noted at this time. 06/19/18 evaluation today patient's ulcer on the lower extremity appears to be doing better at this point. he has been tolerating the dressing changes. The patient seems to be somewhat depressed about the progress of his wound although the overall appearance at this time seems to be doing well. In general I'm very pleased with the appearance today he does have some biofilm on the surface of the wound as well as of hyper granular tissue we may want to utilize a little bit of silver nitrate today. 06/27/2018; patient comes into clinic today for a wound on the right lateral calf likely related to chronic venous insufficiency. He has been using medihoney covered with Hydrofera Blue. Wound surface actually looks quite good 07/03/2018 seen today for follow-up and management of lower extremity ulcer right lateral shin. T olerating dressing changes. He has a small amount of biofilm today. Will attempt to remove a portion of the biofilm as he tolerates. He becomes very anxious with wound treatments. He would benefit from layer compression wraps however he refuses compression wraps or anything that smokes his legs. He expressed an inability to tolerate compression wraps. Juxta lite wraps have been ordered. He has been instructed on the appropriate application of the juxta leg wraps. His blood pressure today on visit was 200/120. He denies any blurred vision, chest pain, dizziness, shortness of breath, or difficulty with mobility. Mr. Wichman stated that he had a recent visit with his primary care provider. At that time his carvedilol was decreased from 25  mg daily to 12.5 mg daily. Strongly encouraged Mr. Dykstra to seek medical attention today during visit. He declined transport for  evaluation of elevated blood pressure. Encouraged him to contact his primary care provider today for medication management. He stated that he would call his primary care doctor after wound visit today. Also encouraged him that if he experienced any symptoms of blurred vision, chest pain, shortness of breath to immediately seek medical attention. 07/17/18 on evaluation today patient's wound actually does seem to show signs of improvement based on the overall appearance of the wound bed today. There does not appear to show any signs of infection which is good news. There is no overall worsening which is also good news. He still has hyper granular tissue will use in the [employer] followed by [facility] Dressing this point. 07/31/18 on evaluation today patient's wound bed actually show signs of improvement with good epithelialization especially in the upper portion of the wound. He has been tolerating the dressing changes without complication in general. Overall I'm extremely happy with how things stand. No fevers, chills, nausea, or vomiting noted at this time. 08/28/18 on evaluation today patient appears to be doing a little better in regard to his lower extremity ulcer. With that being said it appears to be very hyper granular I think this is directly attributed to the fact that he continues to use the [employer] underneath the [facility] Dressing. Although this helps not to stick unfortunately also think he's not getting the benefit of the [facility] Dressing particular and subsequently is causing too much moisture buildup hence the hyper granulation. Fortunately there does not appear to be any signs of infection at this time which is good news. 09/03/18 on evaluation today patient appears to be doing well in regard to his lower [name] ulcer. He has been tolerating the dressing changes without complication. Fortunately he has much less hyper granular tissue the noted last week I do think using  silver nitrate in changing to using the [facility] Dressing without the mepitel has made a big difference for him this is good news. 09/18/18 on evaluation today patient appears to be doing excellent in regard to his right lower [name] ulcer. This is actually significantly smaller Anthony Barnes compared to the last evaluation. Overall I'm very pleased in this regard. I'm a recommend that we likely repeat the silver nitrate today based on what I'm seeing. 10/02/18 on evaluation today patient appears to be doing excellent in regard to his lower extremity wound on the right. He's been tolerating the dressing changes without complication. Fortunately there's no signs of infection at this time. Overall been very pleased with how things seem to be progressing. No fevers, chills, nausea, or vomiting noted at this time. 10/16/18 on evaluation today patient actually appears to be doing much better in regard to his lower extremity wound on the right. This is shown signs of good improvement and is indeed measuring smaller he is on a excellent track as far as healing is concerned. My hope is this will be healed of the next several Barnes. Fortunately there is no evidence of infection at this time. He does seem to be doing everything that I'm recommending for him 10/30/18 on evaluation today patient appears to be doing better in regard to his lower extremity ulcer. He's been tolerating the dressing changes without complication. Fortunately the Hydrofera Blue Dressing to be doing the job. He has made excellent progress. With that being said this is still going somewhat  slow but nonetheless is always making good progress at this point. 5/18-Patient was in to be seen for right leg ulcer that appeared after scab above it was removed today. This area had healed completely. It appears that no further action is required on this READMISSION 01/02/2022 This is a now 66 year old male with poorly controlled type 2 diabetes mellitus  and chronic venous insufficiency who once again was performing lawn care when a rock was flung up by his weedeater and it struck him in the right lateral lower leg. The wound has not healed though it has been nearly 2 months since the injury occurred. He was seen in the emergency department at [facility] on May 9. At that time it was very painful and he described it as a "15 on a scale of 0oo10". He also was found to have 3+ pitting edema to the bilateral lower extremities. He was prescribed a Barnes course of Keflex. He is here today because the wound has failed to heal and he has a prior history in our clinic. ABI in clinic today was noncompressible. He did have formal vascular studies performed in 2019 which were normal. The last hemoglobin A1c I have available for review was from March 21, 2021 and was elevated at 7.7%. The wound is fairly small and circular located about 3 inches above his right lateral malleolus. It is completely covered with eschar. No surrounding erythema, no odor, no purulent drainage. 01/11/2022: The wound on his right lateral leg is a little bit smaller today. It has reaccumulated some slough and a small bit of eschar. It remains fairly painful. He has another area on his anterior tibia that he will not let me touch but I am concerned that there is a wound forming underneath the surface. 01/18/2022: Unfortunately, his wound is a little bit bigger today. He continues to accumulate slough on the wound surface. It is still quite tender. 01/25/2022: The patient bumped his wound on the car door which resulted in bleeding. He was seen at urgent care where it was redressed. Unfortunately, the wound is bigger again today. There is accumulated slough on the surface. Urgent care gave him some tramadol, apparently and he took this prior to his visit so he could tolerate debridement better. Anthony Barnes, Anthony Barnes ([id number]) [id number] Page 7 of 9 02/02/2022: The  wound is a little bit smaller today. The surface, however, has accumulated a fairly thick layer of slough. The periwound skin is intact and there is no obvious sign of infection. 02/07/2022: The wound is a bit larger today. It has thick slough accumulation. The periwound skin is intact and there is no obvious erythema, induration, nor any odor. 02/16/2022: I took a culture last week due to the appearance of the wound and the patient's degree of pain. This grew out methicillin sensitive Staph aureus. Augmentin was prescribed. The patient states that the pain is markedly improved today. The wound also looks much better. It is smaller with less slough buildup and there is good granulation tissue emerging. 02/21/2022: The patient's pain is quite improved from prior visits. The wound is about the same size and has some accumulated slough on the surface. The base is a little bit fibrotic but there are some areas of granulation tissue filling in. 02/28/2022: The wound is a little bit narrower on its measurements but slightly longer. There is still some slough accumulation on the surface, but he has minimal pain and there is no concern for ongoing infection. Granulation tissue is  emerging. 03/07/2022: During the week, he cut holes in his wrap because it was painful and too tight. As a result, his leg was quite a bit more swollen today and his wound was larger. The wound surface is fairly clean but a little bit dry. Minimal slough accumulation. Granulation tissue present. 03/14/2022: Once again, he did some arts and crafts projects with his wrap. On the other hand, however, his wound does look better. It is smaller today and is beginning to fill in. Minimal slough and a small amount of eschar accumulation. 03/27/2022: His wound measured bigger today, although it does not appear to be so on my impression. The wound continues to fill with granulation tissue. There is a little bit of slough and eschar accumulation. He  continues to cut his wrap off of his feet and his edema control at the ankle is particularly poor with 3+ pitting edema. His insurance will not cover any sort of skin substitute unfortunately. 04/04/2022: The wound is smaller today. It is more superficial with good granulation tissue and just a little bit of slough. He continues to cut away portions of his wrap which results in inadequate edema control. 04/11/2022: His wound measured a couple millimeters larger today. It continues to fill with granulation tissue and there is minimal slough on the surface. 04/19/2022: No significant change in the wound dimensions, but the surface is healthier and less fibrotic. 04/26/2022: The wound is a little bit bigger again today. There is slough accumulation on the surface. Edema control is inadequate. He has been cutting the foot portion of his wrap off each week. 05/03/2022: The wound is a little smaller this week. There is still a fair amount of slough on the surface. His drainage was a little bit as greenish today, although not the blue-green typically associated with Pseudomonas. No significant odor. The underlying granulation tissue appears healthy. The culture that I took last week grew out methicillin sensitive Staph aureus. 05/10/2022: The wound is again a little bit smaller this week. He still has slough accumulation on the surface. Most of the surface has fairly decent granulation tissue, but there is still a fibrotic portion in the center near the caudal aspect of the wound. He did not pick up the Augmentin that I prescribed for his MSSA infection. He says he will get it today. 05/19/2022: His wound is smaller by half a centimeter today. There is a little bit of slough buildup, but less so than at previous visits. He has not been taking his Augmentin properly. He has been splitting the tablets in half and taking them on an irregular schedule. He says this is because it has been upsetting  his stomach. 05/25/2022: His wound is a little bit smaller again this week. Very minimal slough accumulation. The wound is drier than I would like to see. He completed his course of Augmentin. 10/19; patient has a wound on the right lateral lower leg in the setting of obvious chronic venous insufficiency. He has had a previous wound on the anterior leg that we dealt with several years ago. He has been using Hydrofera Blue. Kerlix and Coban 06/08/2022: The wound is smaller again this week. There is some light slough accumulation. It remains a little dry. 06/16/2022: Although the wound dimensions were the same this week, on visual inspection it appears smaller. It is clean, but still dry despite the use of hydrogel. 06/22/2022: His wound responded very well to endoform. It is smaller, moisture balance is excellent, and the surface  has lovely granulation tissue. 06/29/2022: His wound is smaller again this week. The granulation tissue is a little bit hypertrophic. No concern for infection. 07/13/2022: His wound continues to improve. The open area is much smaller, but there is still some hypertrophic granulation tissue present. 07/21/2022: The wound is covered by a thick dark scab. Once this was removed, the majority of the wound was found to be healed, with just a couple of open areas with some slough on them. 12/14The patient comes in today with the area still scabbed over. He would not let me touch these. I explained the problem from a providers point of view about scabs on the wounds including the possibility of deep underlying wounds, superficial wounds that have not healed. There is a course of the possibility that everything is epithelialized under scab. He said he understood this but did not want these further debrided this was initially trauma in the setting of chronic venous insufficiency and lymphedema Objective Constitutional Patient is hypertensive.. Pulse regular and within target range for  patient.Marland Kitchen Respirations regular, non-labored and within target range.. Temperature is normal and within the target range for the patient.Marland Kitchen Appears in no distress. Vitals Time Taken: 9:40 AM, Height: 75 in, Weight: 310 lbs, BMI: 38.7, Temperature: 98.2 F, Pulse: 76 bpm, Respiratory Rate: 20 breaths/min, Blood Pressure: 149/97 mmHg, Capillary Blood Glucose: 130 mg/dl. Anthony Barnes, Anthony Barnes ([id number]) [id number] Page 8 of 9 General Notes: Wound exam; the area is covered by a thick dry scab I note that this was debrided last week with only small open areas remaining this would suggest that there is still some drainage from these open areas. Nevertheless he would not let me touch this. There is no infection here and no evidence of uncontrolled edema Integumentary (Hair, Skin) Wound #2 status is Open. Original cause of wound was Trauma. The date acquired was: 12/03/2021. The wound has been in treatment 29 Barnes. The wound is located on the Right,Lateral Lower Leg. The wound measures 0cm length x 0cm width x 0cm depth; 0cm^2 area and 0cm^3 volume. There is no tunneling or undermining noted. There is a none present amount of drainage noted. The wound margin is flat and intact. There is no granulation within the wound bed. There is no necrotic tissue within the wound bed. The periwound skin appearance had no abnormalities noted for texture. The periwound skin appearance had no abnormalities noted for color. The periwound skin appearance exhibited: Dry/Scaly. Periwound temperature was noted as No Abnormality. Assessment Active Problems ICD-10 Non-pressure chronic ulcer of other part of right lower leg with fat layer exposed Type 2 diabetes mellitus with other skin ulcer Type 2 diabetes mellitus without complications Essential (primary) hypertension Venous insufficiency (chronic) (peripheral) Plan Follow-up Appointments: Return Appointment in 1 week. - Dr. Lady Gary - room  2 Discharge From [facility]: Discharge from Wound Care Center - Congratulations!!!!!!!!!!!! Edema Control - Lymphedema / SCD / Other: Elevate legs to the level of the heart or above for 30 minutes daily and/or when sitting, a frequency of: Avoid standing for long periods of time. Patient to wear own compression stockings every day. Exercise regularly Moisturize legs daily. Compression stocking or Garment 20-30 mm/Hg pressure to: 1. The patient has a stocking for the right leg he bought on [employer] I am not exactly sure of the degree of compression 2. He is going to put this on the right leg I have asked him to moisturize his skin and to call us if there is further difficulties. 3.  Once again I explained how wound care providers look at scabs and he understands that it is possible that this is not healed 4. I have discharged him from the clinic Electronic Signature(s) Signed: 07/27/2022 4:13:03 PM By: Baltazar Najjar MD Entered By: Baltazar Najjar on 07/27/2022 11:28:05 -------------------------------------------------------------------------------- SuperBill Details Patient Name: Date of Service: Anthony Barnes, Anthony [name] 07/27/2022 Medical Record Number: [id number] Patient Account Number: [id number] Date of Birth/Sex: Treating RN: 29-Jun-1956 (66 y.o. 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.622 Type 2 diabetes mellitus with other skin ulcer E11.9 Type 2 diabetes mellitus without complications I10 Essential (primary) hypertension I87.2 Venous insufficiency (chronic) (peripheral) Physician Procedures : CPT4 Code Description Modifier [id number]  [id number] - WC PHYS LEVEL 3 - EST PT ICD-10 Diagnosis Description 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osed I87.2 Venous insufficiency (chronic) (peripheral) E11.622  Type 2 diabetes mellitus with other skin ulcer Quantity: 1 Electronic Signature(s) Signed: 07/27/2022 4:13:03 PM By: Baltazar Najjar MD Entered By: Baltazar Najjar on 07/27/2022 11:28:30

## 2022-07-27 NOTE — Progress Notes (Signed)
TREVEL, DILLENBECK ([id number]) [id number] Page 1 of 5 Visit Report for 07/27/2022 Arrival Information Details Patient Name: Date of Service: Anthony Barnes [name] 07/27/2022 9:30 A M Medical Record Number: [id number] Patient Account Number: [id number] Date of Birth/Sex: Treating RN: November 27, 1955 (66 y.o. M) Primary Care Shaheim Mahar: Hoy Register Other Clinician: Referring Saliha Salts: Treating Rox Mcgriff/Extender: Leotis Pain in Treatment: 29 Visit Information History Since Last Visit All ordered tests and consults were completed: No Patient Arrived: Ambulatory Added or deleted any medications: No Arrival Time: 09:41 Any new allergies or adverse reactions: No Accompanied By: self Had a fall or experienced change in No Transfer Assistance: None activities of daily living that may affect Patient Identification Verified: Yes risk of falls: Secondary Verification Process Completed: Yes Signs or symptoms of abuse/neglect since last visito No Patient Requires Transmission-Based Precautions: No Hospitalized since last visit: No Patient Has Alerts: No Implantable device outside of the clinic excluding No cellular tissue based products placed in the center since last visit: Pain Present Now: No Electronic Signature(s) Signed: 07/27/2022 11:12:47 AM By: Dayton Scrape Entered By: Dayton Scrape on 07/27/2022 09:42:17 -------------------------------------------------------------------------------- Encounter Discharge Information Details Patient Name: Date of Service: [name], [name] 07/27/2022 9:30 A M Medical Record Number: [id number] Patient Account Number: [id number] Date of Birth/Sex: Treating RN: [birth year] (66 y.o. [name] Primary Care Orval Dortch: Hoy Register Other Clinician: Referring Marria Mathison: Treating Cyani Kallstrom/Extender: Josephine Cables Weeks in Treatment: 29 Encounter Discharge Information  Items Discharge Condition: Stable Ambulatory Status: Ambulatory Discharge Destination: Home Transportation: Private Auto Accompanied By: self Schedule Follow-up Appointment: Yes Clinical Summary of Care: Patient Declined Electronic Signature(s) Signed: 07/27/2022 4:17:37 PM By: Samuella Bruin Entered By: Samuella Bruin on 07/27/2022 11:27:50 Arnetha Massy ([id number]) [id number] Page 2 of 5 -------------------------------------------------------------------------------- Lower Extremity Assessment Details Patient Name: Date of Service: Anthony Barnes [name] 07/27/2022 9:30 A M Medical Record Number: [id number] Patient Account Number: [id number] Date of Birth/Sex: Treating RN: 19-Feb-1956 (66 y.o. [name] Primary Care Ellie Spickler: Hoy Register Other Clinician: Referring Huberta Tompkins: Treating Graceson Nichelson/Extender: Josephine Cables Weeks in Treatment: 29 Edema Assessment Assessed: Kyra Searles: No] [Right: No] [Left: Edema] [Right: :] Calf Left: Right: Point of Measurement: From Medial Instep 42 cm Ankle Left: Right: Point of Measurement: From Medial Instep 24.5 cm Vascular Assessment Pulses: Dorsalis Pedis Palpable: [Right:Yes] Electronic Signature(s) Signed: 07/27/2022 4:17:37 PM By: Samuella Bruin Entered By: Samuella Bruin on 07/27/2022 10:03:47 -------------------------------------------------------------------------------- Multi-Disciplinary Care Plan Details Patient Name: Date of Service: [name], [name] 07/27/2022 9:30 A M Medical Record Number: [id number] Patient Account Number: [id number] Date of Birth/Sex: Treating RN: August 13, 1956 (66 y.o. [name] Primary Care Aleasha Fregeau: Hoy Register Other Clinician: Referring Mindee Robledo: Treating Cola Gane/Extender: Josephine Cables Weeks in Treatment: 29 Active Inactive Electronic Signature(s) Signed: 07/27/2022 4:17:37 PM By:  Samuella Bruin Entered By: Samuella Bruin on 07/27/2022 10:18:47 Pain Assessment Details -------------------------------------------------------------------------------- Arnetha Massy ([id number]) [id number] Page 3 of 5 Patient Name: Date of Service: Anthony Barnes [name] 07/27/2022 9:30 A M Medical Record Number: [id number] Patient Account Number: [id number] Date of Birth/Sex: Treating RN: Jun 01, 1956 (66 y.o. M) Primary Care Zakariah Urwin: Hoy Register Other Clinician: Referring Xzayvion Vaeth: Treating Angelisse Riso/Extender: Josephine Cables Weeks in Treatment: 29 Active Problems Location of Pain Severity and Description of Pain Patient Has Paino No Site Locations Pain Management and Medication Current Pain Management: Electronic Signature(s) Signed: 07/27/2022 11:12:47 AM By: Dayton Scrape Entered By: Dayton Scrape on 07/27/2022 09:43:05 -------------------------------------------------------------------------------- Patient/Caregiver Education  Details Patient Name: Date of Service: [name] 12/14/2023andnbsp9:30 A M Medical Record Number: [id number] Patient Account Number: [id number] Date of Birth/Gender: Treating RN: 1956/02/17 (66 y.o. [name] Primary Care Physician: Hoy Register Other Clinician: Referring Physician: Treating Physician/Extender: Leotis Pain in Treatment: 29 Education Assessment Education Provided To: Patient Education Topics Provided Venous: Methods: Explain/Verbal Responses: Reinforcements needed, State content correctly Electronic Signature(s) Signed: 07/27/2022 4:17:37 PM By: Samuella Bruin Entered By: Samuella Bruin on 07/27/2022 10:09:16 Arnetha Massy ([id number]) [id number] Page 4 of 5 -------------------------------------------------------------------------------- Wound Assessment Details Patient Name: Date of  Service: Anthony Barnes [name] 07/27/2022 9:30 A M Medical Record Number: [id number] Patient Account Number: [id number] Date of Birth/Sex: Treating RN: [birth year] (66 y.o. [name] Primary Care Rosangela Fehrenbach: Hoy Register Other Clinician: Referring Remus Hagedorn: Treating Liberti Appleton/Extender: Josephine Cables Weeks in Treatment: 29 Wound Status Wound Number: 2 Primary Etiology: Venous Leg Ulcer Wound Location: Right, Lateral Lower Leg Wound Status: Open Wounding Event: Trauma Comorbid History: Hypertension, Type II Diabetes Date Acquired: 12/03/2021 Weeks Of Treatment: 29 Clustered Wound: No Photos Wound Measurements Length: (cm) Width: (cm) Depth: (cm) Area: (cm) Volume: (cm) 0 % Reduction in Area: 100% 0 % Reduction in Volume: 100% 0 Epithelialization: Large (67-100%) 0 Tunneling: No 0 Undermining: No Wound Description Classification: Full Thickness Without Exposed Support Structures Wound Margin: Flat and Intact Exudate Amount: None Present Foul Odor After Cleansing: No Slough/Fibrino No Wound Bed Granulation Amount: None Present (0%) Exposed Structure Necrotic Amount: None Present (0%) Fascia Exposed: No Fat Layer (Subcutaneous Tissue) Exposed: No Tendon Exposed: No Muscle Exposed: No Joint Exposed: No Bone Exposed: No Periwound Skin Texture Texture Color No Abnormalities Noted: Yes No Abnormalities Noted: Yes Moisture Temperature / Pain No Abnormalities Noted: No Temperature: No Abnormality Dry / Scaly: Yes Electronic Signature(s) Signed: 07/27/2022 4:17:37 PM By: Samuella Bruin Entered By: Samuella Bruin on 07/27/2022 10:04:17 Arnetha Massy ([id number]) [id number] Page 5 of 5 -------------------------------------------------------------------------------- Vitals Details Patient Name: Date of Service: Anthony Barnes [name] 07/27/2022 9:30 A M Medical Record Number: [id number] Patient Account  Number: [id number] Date of Birth/Sex: Treating RN: June 25, 1956 (66 y.o. M) Primary Care Kyree Fedorko: Hoy Register Other Clinician: Referring Naisha Wisdom: Treating Lakrista Scaduto/Extender: Josephine Cables Weeks in Treatment: 29 Vital Signs Time Taken: 09:40 Temperature (F): 98.2 Height (in): 75 Pulse (bpm): 76 Weight (lbs): 310 Respiratory Rate (breaths/min): 20 Body Mass Index (BMI): 38.7 Blood Pressure (mmHg): 149/97 Capillary Blood Glucose (mg/dl): 818 Reference Range: 80 - 120 mg / dl Electronic Signature(s) Signed: 07/27/2022 11:12:47 AM By: Dayton Scrape Entered By: Dayton Scrape on 07/27/2022 09:42:57

## 2022-10-17 ENCOUNTER — Ambulatory Visit: Payer: Self-pay | Attending: Family Medicine | Admitting: Family Medicine

## 2022-10-17 ENCOUNTER — Encounter: Payer: Self-pay | Admitting: Family Medicine

## 2022-10-17 VITALS — BP 160/95 | HR 77 | Temp 97.9°F | Ht 76.0 in | Wt 306.0 lb

## 2022-10-17 DIAGNOSIS — E1149 Type 2 diabetes mellitus with other diabetic neurological complication: Secondary | ICD-10-CM

## 2022-10-17 DIAGNOSIS — E1169 Type 2 diabetes mellitus with other specified complication: Secondary | ICD-10-CM

## 2022-10-17 DIAGNOSIS — I152 Hypertension secondary to endocrine disorders: Secondary | ICD-10-CM

## 2022-10-17 DIAGNOSIS — E785 Hyperlipidemia, unspecified: Secondary | ICD-10-CM

## 2022-10-17 DIAGNOSIS — E1159 Type 2 diabetes mellitus with other circulatory complications: Secondary | ICD-10-CM

## 2022-10-17 LAB — POCT GLYCOSYLATED HEMOGLOBIN (HGB A1C): HbA1c, POC (controlled diabetic range): 8.8 % — AB (ref 0.0–7.0)

## 2022-10-17 LAB — GLUCOSE, POCT (MANUAL RESULT ENTRY): POC Glucose: 158 mg/dl — AB (ref 70–99)

## 2022-10-17 MED ORDER — PIOGLITAZONE HCL 15 MG PO TABS: 15.0000 mg | ORAL_TABLET | Freq: Every day | ORAL | 1 refills | Status: DC

## 2022-10-17 MED ORDER — CLONIDINE HCL 0.3 MG PO TABS: 0.3000 mg | ORAL_TABLET | Freq: Two times a day (BID) | ORAL | 1 refills | Status: DC

## 2022-10-17 NOTE — Patient Instructions (Signed)
Managing Your Hypertension Hypertension, also called high blood pressure, is when the force of the blood pressing against the walls of the arteries is too strong. Arteries are blood vessels that carry blood from your heart throughout your body. Hypertension forces the heart to work harder to pump blood and may cause the arteries to become narrow or stiff. Understanding blood pressure readings A blood pressure reading includes a higher number over a lower number: The first, or top, number is called the systolic pressure. It is a measure of the pressure in your arteries as your heart beats. The second, or bottom number, is called the diastolic pressure. It is a measure of the pressure in your arteries as the heart relaxes. For most people, a normal blood pressure is below 120/80. Your personal target blood pressure may vary depending on your medical conditions, your age, and other factors. Blood pressure is classified into four stages. Based on your blood pressure reading, your health care provider may use the following stages to determine what type of treatment you need, if any. Systolic pressure and diastolic pressure are measured in a unit called millimeters of mercury (mmHg). Normal Systolic pressure: below 120. Diastolic pressure: below 80. Elevated Systolic pressure: 120-129. Diastolic pressure: below 80. Hypertension stage 1 Systolic pressure: 130-139. Diastolic pressure: 80-89. Hypertension stage 2 Systolic pressure: 140 or above. Diastolic pressure: 90 or above. How can this condition affect me? Managing your hypertension is very important. Over time, hypertension can damage the arteries and decrease blood flow to parts of the body, including the brain, heart, and kidneys. Having untreated or uncontrolled hypertension can lead to: A heart attack. A stroke. A weakened blood vessel (aneurysm). Heart failure. Kidney damage. Eye damage. Memory and concentration problems. Vascular  dementia. What actions can I take to manage this condition? Hypertension can be managed by making lifestyle changes and possibly by taking medicines. Your health care provider will help you make a plan to bring your blood pressure within a normal range. You may be referred for counseling on a healthy diet and physical activity. Nutrition  Eat a diet that is high in fiber and potassium, and low in salt (sodium), added sugar, and fat. An example eating plan is called the DASH diet. DASH stands for Dietary Approaches to Stop Hypertension. To eat this way: Eat plenty of fresh fruits and vegetables. Try to fill one-half of your plate at each meal with fruits and vegetables. Eat whole grains, such as whole-wheat pasta, brown rice, or whole-grain bread. Fill about one-fourth of your plate with whole grains. Eat low-fat dairy products. Avoid fatty cuts of meat, processed or cured meats, and poultry with skin. Fill about one-fourth of your plate with lean proteins such as fish, chicken without skin, beans, eggs, and tofu. Avoid pre-made and processed foods. These tend to be higher in sodium, added sugar, and fat. Reduce your daily sodium intake. Many people with hypertension should eat less than 1,500 mg of sodium a day. Lifestyle  Work with your health care provider to maintain a healthy body weight or to lose weight. Ask what an ideal weight is for you. Get at least 30 minutes of exercise that causes your heart to beat faster (aerobic exercise) most days of the week. Activities may include walking, swimming, or biking. Include exercise to strengthen your muscles (resistance exercise), such as weight lifting, as part of your weekly exercise routine. Try to do these types of exercises for 30 minutes at least 3 days a week. Do   not use any products that contain nicotine or tobacco. These products include cigarettes, chewing tobacco, and vaping devices, such as e-cigarettes. If you need help quitting, ask your  health care provider. Control any long-term (chronic) conditions you have, such as high cholesterol or diabetes. Identify your sources of stress and find ways to manage stress. This may include meditation, deep breathing, or making time for fun activities. Alcohol use Do not drink alcohol if: Your health care provider tells you not to drink. You are pregnant, may be pregnant, or are planning to become pregnant. If you drink alcohol: Limit how much you have to: 0-1 drink a day for women. 0-2 drinks a day for men. Know how much alcohol is in your drink. In the [place] one drink equals one 12 oz bottle of beer (355 mL), one 5 oz glass of wine (148 mL), or one 1 oz glass of hard liquor (44 mL). Medicines Your health care provider may prescribe medicine if lifestyle changes are not enough to get your blood pressure under control and if: Your systolic blood pressure is 130 or higher. Your diastolic blood pressure is 80 or higher. Take medicines only as told by your health care provider. Follow the directions carefully. Blood pressure medicines must be taken as told by your health care provider. The medicine does not work as well when you skip doses. Skipping doses also puts you at risk for problems. Monitoring Before you monitor your blood pressure: Do not smoke, drink caffeinated beverages, or exercise within 30 minutes before taking a measurement. Use the bathroom and empty your bladder (urinate). Sit quietly for at least 5 minutes before taking measurements. Monitor your blood pressure at home as told by your health care provider. To do this: Sit with your back straight and supported. Place your feet flat on the floor. Do not cross your legs. Support your arm on a flat surface, such as a table. Make sure your upper arm is at heart level. Each time you measure, take two or three readings one minute apart and record the results. You may also need to have your blood pressure checked regularly by  your health care provider. General information Talk with your health care provider about your diet, exercise habits, and other lifestyle factors that may be contributing to hypertension. Review all the medicines you take with your health care provider because there may be side effects or interactions. Keep all follow-up visits. Your health care provider can help you create and adjust your plan for managing your high blood pressure. Where to find more information National Heart, Lung, and Blood Institute: www.nhlbi.nih.gov American Heart Association: www.heart.org Contact a health care provider if: You think you are having a reaction to medicines you have taken. You have repeated (recurrent) headaches. You feel dizzy. You have swelling in your ankles. You have trouble with your vision. Get help right away if: You develop a severe headache or confusion. You have unusual weakness or numbness, or you feel faint. You have severe pain in your chest or abdomen. You vomit repeatedly. You have trouble breathing. These symptoms may be an emergency. Get help right away. Call 911. Do not wait to see if the symptoms will go away. Do not drive yourself to the hospital. Summary Hypertension is when the force of blood pumping through your arteries is too strong. If this condition is not controlled, it may put you at risk for serious complications. Your personal target blood pressure may vary depending on your medical conditions,   your age, and other factors. For most people, a normal blood pressure is less than 120/80. Hypertension is managed by lifestyle changes, medicines, or both. Lifestyle changes to help manage hypertension include losing weight, eating a healthy, low-sodium diet, exercising more, stopping smoking, and limiting alcohol. This information is not intended to replace advice given to you by your health care provider. Make sure you discuss any questions you have with your health care  provider. Document Revised: 04/14/2021 Document Reviewed: 04/14/2021 [employer] Patient Education  2023 [employer]  

## 2022-10-17 NOTE — Progress Notes (Signed)
Subjective:  Patient ID: Anthony Barnes, male    DOB: May 14, 1956  Age: 67 y.o. MRN: [id number]  CC: Diabetes   HPI Anthony Barnes is a 67 y.o. year old male with a history of hypertension, type 2 diabetes mellitus A1c ( A1c  8.8), morbid obesity here for follow-up visit.   Interval History:  A1c is 8.8 up from 7.6 and he has been eating everything and not exercising.  He remains on just glipizide for diabetes and has been unable to tolerate metformin in the past and declined [place] and is not open to an injectable. He has no neuropathy symptoms or visual concerns.  He has been taking half of the Coreg '25mg'$  rather than a full tablet.  Spironolactone was added previously which she did not take due to intolerance.  His blood pressure is elevated today. Past Medical History:  Diagnosis Date   Diabetes mellitus without complication ([place])    Hypertension     Past Surgical History:  Procedure Laterality Date   BRAIN SURGERY     ESOPHAGOGASTRODUODENOSCOPY N/A 03/22/2013   Procedure: ESOPHAGOGASTRODUODENOSCOPY (EGD);  Surgeon: Wonda Horner, MD;  Location: [facility] END[facility]COPY;  Service: Endoscopy;  Laterality: N/A;    Family History  Problem Relation Age of Onset   Hypertension Mother    Cancer Mother 59       ovarian   Diabetes Father    Stroke Father     Social History   Socioeconomic History   Marital status: Married    Spouse name: Not on file   Number of children: Not on file   Years of education: Not on file   Highest education level: Not on file  Occupational History   Not on file  Tobacco Use   Smoking status: Former    Packs/day: 1.00    Years: 16.00    Total pack years: 16.00    Types: Cigarettes    Start date: 08/14/1974    Quit date: 08/14/1990    Years since quitting: 32.1   Smokeless tobacco: Former    Quit date: 08/14/1990  Vaping Use   Vaping Use: Never used  Substance and Sexual Activity   Alcohol use: Yes    Alcohol/week: 4.0 standard drinks of alcohol     Types: 2 Cans of beer, 2 Shots of liquor per week    Comment: occas   Drug use: No   Sexual activity: Yes  Other Topics Concern   Not on file  Social History Narrative   Not on file   Social Determinants of Health   Financial Resource Strain: Not on file  Food Insecurity: Not on file  Transportation Needs: Not on file  Physical Activity: Not on file  Stress: Not on file  Social Connections: Not on file    Allergies  Allergen Reactions   Shellfish Allergy Anaphylaxis   Codeine Itching    unknown    Outpatient Medications Prior to Visit  Medication Sig Dispense Refill   amLODipine (NORVASC) 10 MG tablet Take 1 tablet (10 mg total) by mouth daily. 90 tablet 1   Blood Glucose Monitoring Suppl (TRUE METRIX METER) w/Device KIT Check blood sugar 3 times daily. 1 kit 0   carvedilol (COREG) 25 MG tablet Take 1 tablet (25 mg total) by mouth 2 (two) times daily with a meal. 180 tablet 1   glipiZIDE (GLUCOTROL) 10 MG tablet Take 1 tablet by mouth twice daily before meals 180 tablet 0   ibuprofen (ADVIL,MOTRIN) 800 MG  tablet Take 1 tablet (800 mg total) by mouth every 12 (twelve) hours as needed. 30 tablet 0   lisinopril-hydrochlorothiazide (ZESTORETIC) 20-12.5 MG tablet TAKE TWO TABLETS BY MOUTH DAILY 180 tablet 0   Multiple Vitamin (MULTIVITAMIN WITH MINERALS) TABS tablet Take 1 tablet by mouth daily.     omega-3 acid ethyl esters (LOVAZA) 1 g capsule Take 1 g by mouth 2 (two) times daily.     TRUEplus Lancets 28G MISC CHECK BLOOD SUGAR 3 TIMES DAILY. 100 each 2   cloNIDine (CATAPRES) 0.3 MG tablet TAKE ONE TABLET BY MOUTH TWICE DAILY 180 tablet 0   atorvastatin (LIPITOR) 40 MG tablet Take 1 tablet (40 mg total) by mouth daily. (Patient not taking: Reported on 10/17/2022) 90 tablet 1   bacitracin ointment Apply 1 application topically 2 (two) times daily. (Patient not taking: Reported on 10/17/2022) 120 g 0   gabapentin (NEURONTIN) 300 MG capsule Take 1 capsule (300 mg total) by mouth at  bedtime. (Patient not taking: Reported on 10/17/2022) 90 capsule 1   traMADol (ULTRAM) 50 MG tablet TAKE ONE TABLET BY MOUTH TWICE DAILY BEFORE AND AFTER DEBRIDEMENT PROCEDURE (Patient not taking: Reported on 10/17/2022) 7 tablet 0   amLODipine (NORVASC) 10 MG tablet TAKE ONE TABLET BY MOUTH ONE TIME DAILY (Patient not taking: Reported on 10/17/2022) 90 tablet 0   spironolactone (ALDACTONE) 25 MG tablet Take 1 tablet (25 mg total) by mouth daily. (Patient not taking: Reported on 10/17/2022) 90 tablet 1   No facility-administered medications prior to visit.     ROS Review of Systems  Constitutional:  Negative for activity change and appetite change.  HENT:  Negative for sinus pressure and sore throat.   Respiratory:  Negative for chest tightness, shortness of breath and wheezing.   Cardiovascular:  Negative for chest pain and palpitations.  Gastrointestinal:  Negative for abdominal distention, abdominal pain and constipation.  Genitourinary: Negative.   Musculoskeletal: Negative.   Psychiatric/Behavioral:  Negative for behavioral problems and dysphoric mood.     Objective:  BP (!) 160/95   Pulse 77   Temp 97.9 F (36.6 C) (Oral)   Ht '6\' 4"'$  (1.93 m)   Wt (!) 306 lb (138.8 kg)   SpO2 98%   BMI 37.25 kg/m      10/17/2022    4:30 PM 10/17/2022    3:56 PM 01/24/2022    2:23 PM  BP/Weight  Systolic BP 0000000 [id number]  Diastolic BP 95 123XX123 91  Wt. (Lbs)  306 309  BMI  37.25 kg/m2 37.61 kg/m2      Physical Exam Constitutional:      Appearance: He is well-developed.  Cardiovascular:     Rate and Rhythm: Normal rate.     Heart sounds: Normal heart sounds. No murmur heard. Pulmonary:     Effort: Pulmonary effort is normal.     Breath sounds: Normal breath sounds. No wheezing or rales.  Chest:     Chest wall: No tenderness.  Abdominal:     General: Bowel sounds are normal. There is no distension.     Palpations: Abdomen is soft. There is no mass.     Tenderness: There is no abdominal  tenderness.  Musculoskeletal:        General: Normal range of motion.     Right lower leg: No edema.     Left lower leg: No edema.  Neurological:     Mental Status: He is alert and oriented to person, place, and time.  Psychiatric:  Mood and Affect: Mood normal.    Diabetic Foot Exam - Simple   Simple Foot Form Diabetic Foot exam was performed with the following findings: Yes 10/17/2022  4:26 PM  Visual Inspection No deformities, no ulcerations, no other skin breakdown bilaterally: Yes Sensation Testing Intact to touch and monofilament testing bilaterally: Yes Pulse Check Posterior Tibialis and Dorsalis pulse intact bilaterally: Yes Comments        Latest Ref Rng & Units 12/20/2021    2:13 PM 07/20/2020    9:03 AM 12/16/2019    3:30 PM  CMP  Glucose 70 - 99 mg/dL 163  159  225   BUN 8 - 23 mg/dL '15  16  14   '$ Creatinine 0.61 - 1.24 mg/dL 1.01  0.98  1.07   Sodium 135 - 145 mmol/L 134  138  136   Potassium 3.5 - 5.1 mmol/L 3.5  4.5  4.5   Chloride 98 - 111 mmol/L 101  101  97   CO2 22 - 32 mmol/L '25  24  25   '$ Calcium 8.9 - 10.3 mg/dL 9.2  9.7  9.7   Total Protein 6.0 - 8.5 g/dL  7.4    Total Bilirubin 0.0 - 1.2 mg/dL  0.4    Alkaline Phos 44 - 121 IU/L  46    AST 0 - 40 IU/L  16    ALT 0 - 44 IU/L  20      Lipid Panel     Component Value Date/Time   CHOL 156 07/20/2020 0903   TRIG 48 07/20/2020 0903   HDL 49 07/20/2020 0903   CHOLHDL 3.2 07/20/2020 0903   CHOLHDL 3.2 07/21/2016 1051   VLDL 10 07/21/2016 1051   LDLCALC 97 07/20/2020 0903    CBC    Component Value Date/Time   WBC 8.1 11/25/2017 0407   RBC 4.82 11/25/2017 0407   HGB 13.5 11/25/2017 0407   HCT 39.1 11/25/2017 0407   PLT 211 11/25/2017 0407   MCV 81.1 11/25/2017 0407   MCH 28.0 11/25/2017 0407   MCHC 34.5 11/25/2017 0407   RDW 15.4 11/25/2017 0407   LYMPHSABS 1.8 10/20/2013 1250   MONOABS 0.7 10/20/2013 1250   EOSABS 0.1 10/20/2013 1250   BASOSABS 0.0 10/20/2013 1250    Lab Results   Component Value Date   HGBA1C 8.8 (A) 10/17/2022    Assessment & Plan:  1. Type 2 diabetes mellitus with other neurologic complication, without long-term current use of insulin (HCC) Uncontrolled with A1c of 8.8, goal is less than 7.0 He endorses nonadherence with lifestyle modifications He will benefit from a GLP-1 RA which will also help with weight loss and cardiovascular benefit but he declines this Also declines insulin initiation I am concerned that doubling his dose of glipizide might cause hypoglycemia hence Actos has been added Counseled on Diabetic diet, my plate method, [id number] minutes of moderate intensity exercise/week Blood sugar logs with fasting goals of 80-120 mg/dl, random of less than 180 and in the event of sugars less than 60 mg/dl or greater than 400 mg/dl encouraged to notify the clinic. Advised on the need for annual eye exams, annual foot exams, Pneumonia vaccine. - POCT glucose (manual entry) - POCT glycosylated hemoglobin (Hb A1C) - AMB Referral to Pharmacy Medication Management - pioglitazone (ACTOS) 15 MG tablet; Take 1 tablet (15 mg total) by mouth daily.  Dispense: 90 tablet; Refill: 1 - CMP14+EGFR  2. Hypertension associated with diabetes ([place]) Uncontrolled due to medication nonadherence Advise  he needs to take a whole tablet of the 25 mg of Coreg twice daily rather than half a tablet which she has been taking Will reassess blood pressure at next visit Counseled on blood pressure goal of less than 130/80, low-sodium, DASH diet, medication compliance, 150 minutes of moderate intensity exercise per week. Discussed medication compliance, adverse effects. - cloNIDine (CATAPRES) 0.3 MG tablet; Take 1 tablet (0.3 mg total) by mouth 2 (two) times daily.  Dispense: 180 tablet; Refill: 1  3. Hyperlipidemia associated with type 2 diabetes mellitus ([place]) Remains on a statin He is due for fasting lipid panel Low-cholesterol diet   Health Care Maintenance: Due for  urine microalbumin but he is unable to produce urine today.  Will check at next visit Meds ordered this encounter  Medications   pioglitazone (ACTOS) 15 MG tablet    Sig: Take 1 tablet (15 mg total) by mouth daily.    Dispense:  90 tablet    Refill:  1   cloNIDine (CATAPRES) 0.3 MG tablet    Sig: Take 1 tablet (0.3 mg total) by mouth 2 (two) times daily.    Dispense:  180 tablet    Refill:  1    Follow-up: Return in about 1 month (around 11/17/2022) for Blood Pressure follow-up.       Charlott Rakes, MD, FAAFP. [facility] [place], [place]   10/17/2022, 4:46 PM

## 2022-10-18 ENCOUNTER — Encounter: Payer: Self-pay | Admitting: Pharmacist

## 2022-10-18 ENCOUNTER — Telehealth: Payer: Self-pay | Admitting: Pharmacist

## 2022-10-18 LAB — CMP14+EGFR
ALT: 26 IU/L (ref 0–44)
AST: 20 IU/L (ref 0–40)
Albumin/Globulin Ratio: 1.4 (ref 1.2–2.2)
Albumin: 4.6 g/dL (ref 3.9–4.9)
Alkaline Phosphatase: 51 IU/L (ref 44–121)
BUN/Creatinine Ratio: 22 (ref 10–24)
BUN: 22 mg/dL (ref 8–27)
Bilirubin Total: 0.3 mg/dL (ref 0.0–1.2)
CO2: 22 mmol/L (ref 20–29)
Calcium: 9.9 mg/dL (ref 8.6–10.2)
Chloride: 100 mmol/L (ref 96–106)
Creatinine, Ser: 0.98 mg/dL (ref 0.76–1.27)
Globulin, Total: 3.3 g/dL (ref 1.5–4.5)
Glucose: 156 mg/dL — ABNORMAL HIGH (ref 70–99)
Potassium: 4.1 mmol/L (ref 3.5–5.2)
Sodium: 138 mmol/L (ref 134–144)
Total Protein: 7.9 g/dL (ref 6.0–8.5)
eGFR: 86 mL/min/{1.73_m2} (ref >59)

## 2022-10-18 NOTE — Telephone Encounter (Signed)
LIBERATE Study  Received referral for patient participation in the LIBERATE CGM Study. Contacted patient to discuss study. He was unavailable, so I left a HIPAA-compliant VM with instructions to return my call.    Confirmed that patient meets study criteria by having a diagnosis of Type 2 Diabetes, is not currently on insulin, and most recent A1c is >8%.   Benard Halsted, PharmD, [place], [place] [phone]

## 2022-10-30 ENCOUNTER — Other Ambulatory Visit: Payer: Self-pay | Admitting: Family Medicine

## 2022-10-30 DIAGNOSIS — E1149 Type 2 diabetes mellitus with other diabetic neurological complication: Secondary | ICD-10-CM

## 2022-10-30 DIAGNOSIS — E1159 Type 2 diabetes mellitus with other circulatory complications: Secondary | ICD-10-CM

## 2022-11-01 ENCOUNTER — Ambulatory Visit: Payer: Self-pay | Admitting: Physician Assistant

## 2022-11-21 ENCOUNTER — Ambulatory Visit: Payer: Self-pay | Admitting: Pharmacist

## 2022-12-08 ENCOUNTER — Ambulatory Visit: Payer: Self-pay | Admitting: Pharmacist

## 2023-01-26 ENCOUNTER — Other Ambulatory Visit: Payer: Self-pay | Admitting: Family Medicine

## 2023-01-26 DIAGNOSIS — E1149 Type 2 diabetes mellitus with other diabetic neurological complication: Secondary | ICD-10-CM

## 2023-01-26 DIAGNOSIS — I152 Hypertension secondary to endocrine disorders: Secondary | ICD-10-CM

## 2023-02-23 ENCOUNTER — Other Ambulatory Visit: Payer: Self-pay

## 2023-02-23 ENCOUNTER — Ambulatory Visit
Admission: EM | Admit: 2023-02-23 | Discharge: 2023-02-23 | Disposition: A | Discharge status: Home / Self Care | Payer: No Typology Code available for payment source | Attending: Internal Medicine | Admitting: Internal Medicine

## 2023-02-23 ENCOUNTER — Encounter: Payer: Self-pay | Admitting: *Deleted

## 2023-02-23 DIAGNOSIS — H109 Unspecified conjunctivitis: Secondary | ICD-10-CM

## 2023-02-23 MED ORDER — ERYTHROMYCIN 5 MG/GM OP OINT: TOPICAL_OINTMENT | OPHTHALMIC | 0 refills | Status: DC

## 2023-02-23 NOTE — ED Triage Notes (Signed)
States woke this AM with purulent drainage from left eye. Throughout the day has continued to develop purulent drainage with some blurring vision only when pus is developing.

## 2023-02-23 NOTE — ED Provider Notes (Signed)
[facility]    CSN: [id number] Arrival date & time: 02/23/23  1831      History   Chief Complaint No chief complaint on file.   HPI Anthony Barnes is a 67 y.o. male.   Patient presents with left eye purulent drainage that started this morning upon awakening.  Reports mild nasal congestion associated as well.  Denies trauma or foreign body to the eye.  Reports that he has a film over his eye but otherwise denies blurry vision.  Does not wear contacts or glasses.  Denies any known sick contacts.  Patient's blood pressure is also elevated.  He reports that he has not yet taken his afternoon blood pressure medication.  He does not monitor blood pressure at home.  Patient not reporting any chest pain, shortness of breath, nausea, vomiting, headache, dizziness, blurred vision.     Past Medical History:  Diagnosis Date   Diabetes mellitus without complication (HCC)    Hypertension     Patient Active Problem List   Diagnosis Date Noted   Hyperlipidemia 06/25/2018   Morbid obesity (HCC) 03/13/2016   Dental caries 09/09/2015   Diabetes (HCC) 10/20/2013   DM (diabetes mellitus) type 2, uncontrolled, with ketoacidosis (HCC) 04/01/2013   Essential hypertension, benign 04/01/2013   GERD (gastroesophageal reflux disease) 04/01/2013   Erosive gastritis 03/22/2013   DM2 (diabetes mellitus, type 2) (HCC) 03/21/2013   Unspecified essential hypertension 03/21/2013   Hyponatremia 03/21/2013   Epigastric pain 03/20/2013   HTN (hypertension) 03/20/2013    Past Surgical History:  Procedure Laterality Date   BRAIN SURGERY     ESOPHAGOGASTRODUODENOSCOPY N/A 03/22/2013   Procedure: ESOPHAGOGASTRODUODENOSCOPY (EGD);  Surgeon: Graylin Shiver, MD;  Location: [facility] ENDOSCOPY;  Service: Endoscopy;  Laterality: N/A;       Home Medications    Prior to Admission medications   Medication Sig Start Date End Date Taking? Authorizing Provider  amLODipine (NORVASC) 10 MG tablet Take 1  tablet (10 mg total) by mouth daily. 07/25/22  Yes Hoy Register, MD  carvedilol (COREG) 25 MG tablet Take 1 tablet (25 mg total) by mouth 2 (two) times daily with a meal. 07/25/22  Yes Newlin, Enobong, MD  cloNIDine (CATAPRES) 0.3 MG tablet Take 1 tablet (0.3 mg total) by mouth 2 (two) times daily. 10/17/22  Yes Hoy Register, MD  erythromycin ophthalmic ointment Place a 1/2 inch ribbon of ointment into the lower eyelid 4 times daily for 7 days. 02/23/23  Yes Kasi Lasky, Rolly Salter E, FNP  glipiZIDE (GLUCOTROL) 10 MG tablet TAKE ONE TABLET BY MOUTH TWICE DAILY BEFORE MEALS 01/26/23  Yes Newlin, Enobong, MD  lisinopril-hydrochlorothiazide (ZESTORETIC) 20-12.5 MG tablet TAKE TWO TABLETS BY MOUTH DAILY 01/26/23  Yes Hoy Register, MD  Multiple Vitamin (MULTIVITAMIN WITH MINERALS) TABS tablet Take 1 tablet by mouth daily.   Yes [provider]  omega-3 acid ethyl esters (LOVAZA) 1 g capsule Take 1 g by mouth 2 (two) times daily.   Yes [provider]  atorvastatin (LIPITOR) 40 MG tablet Take 1 tablet (40 mg total) by mouth daily. Patient not taking: Reported on 10/17/2022 01/24/22   Hoy Register, MD  bacitracin ointment Apply 1 application topically 2 (two) times daily. Patient not taking: Reported on 10/17/2022 04/29/21   Ivette Loyal, NP  Blood Glucose Monitoring Suppl (TRUE METRIX METER) w/Device KIT Check blood sugar 3 times daily. 12/18/19   Hoy Register, MD  gabapentin (NEURONTIN) 300 MG capsule Take 1 capsule (300 mg total) by mouth at  bedtime. Patient not taking: Reported on 10/17/2022 01/24/22   Hoy Register, MD  ibuprofen (ADVIL,MOTRIN) 800 MG tablet Take 1 tablet (800 mg total) by mouth every 12 (twelve) hours as needed. 08/01/18   Hoy Register, MD  pioglitazone (ACTOS) 15 MG tablet Take 1 tablet (15 mg total) by mouth daily. 10/17/22   Hoy Register, MD  traMADol (ULTRAM) 50 MG tablet TAKE ONE TABLET BY MOUTH TWICE DAILY BEFORE AND AFTER DEBRIDEMENT PROCEDURE Patient not  taking: Reported on 10/17/2022 05/19/22   Hoy Register, MD  TRUEplus Lancets 28G MISC CHECK BLOOD SUGAR 3 TIMES DAILY. 02/22/21   Hoy Register, MD    Family History Family History  Problem Relation Age of Onset   Hypertension Mother    Cancer Mother 35       ovarian   Diabetes Father    Stroke Father     Social History Social History   Tobacco Use   Smoking status: Former    Current packs/day: 0.00    Average packs/day: 1 pack/day for 16.0 years (16.0 ttl pk-yrs)    Types: Cigarettes    Start date: 08/14/1974    Quit date: 08/14/1990    Years since quitting: 32.5   Smokeless tobacco: Former    Quit date: 08/14/1990  Vaping Use   Vaping status: Never Used  Substance Use Topics   Alcohol use: Not Currently    Comment: weekends only   Drug use: No     Allergies   Shellfish allergy and Codeine   Review of Systems Review of Systems Per HPI  Physical Exam Triage Vital Signs ED Triage Vitals  Encounter Vitals Group     BP 02/23/23 1844 (!) 177/120     Systolic BP Percentile --      Diastolic BP Percentile --      Pulse Rate 02/23/23 1844 (!) 105     Resp 02/23/23 1844 20     Temp 02/23/23 1844 98 F (36.7 C)     Temp Source 02/23/23 1844 Oral     SpO2 02/23/23 1844 97 %     Weight --      Height --      Head Circumference --      Peak Flow --      Pain Score 02/23/23 1849 0     Pain Loc --      Pain Education --      Exclude from Growth Chart --    No data found.  Updated Vital Signs BP (!) 163/114   Pulse (!) 105   Temp 98 F (36.7 C) (Oral)   Resp 20   SpO2 97%   Visual Acuity Right Eye Distance: 20/40 -1 Left Eye Distance: 20/70 Bilateral Distance: 20/30 -1  Right Eye Near:   Left Eye Near:    Bilateral Near:     Physical Exam Constitutional:      General: He is not in acute distress.    Appearance: Normal appearance. He is not toxic-appearing or diaphoretic.  HENT:     Head: Normocephalic and atraumatic.  Eyes:     General: Lids are  normal. Lids are everted, no foreign bodies appreciated. Vision grossly intact. Gaze aligned appropriately.     Extraocular Movements: Extraocular movements intact.     Conjunctiva/sclera:     Left eye: Left conjunctiva is injected. Exudate present. No chemosis or hemorrhage.    Pupils: Pupils are equal, round, and reactive to light.  Pulmonary:     Effort: Pulmonary  effort is normal.  Neurological:     General: No focal deficit present.     Mental Status: He is alert and oriented to person, place, and time. Mental status is at baseline.  Psychiatric:        Mood and Affect: Mood normal.        Behavior: Behavior normal.        Thought Content: Thought content normal.        Judgment: Judgment normal.      UC Treatments / Results  Labs (all labs ordered are listed, but only abnormal results are displayed) Labs Reviewed - No data to display  EKG   Radiology No results found.  Procedures Procedures (including critical care time)  Medications Ordered in UC Medications - No data to display  Initial Impression / Assessment and Plan / UC Course  I have reviewed the triage vital signs and the nursing notes.  Pertinent labs & imaging results that were available during my care of the patient were reviewed by me and considered in my medical decision making (see chart for details).     Physical exam is consistent with left bacterial conjunctivitis.  Will treat with erythromycin ointment.  Visual acuity appears normal.  Advised patient to follow-up if symptoms persist or worsen.  Blood pressure is elevated but patient reports that he has not yet taken his afternoon medication.  Advised to monitor blood pressure at home and follow-up with PCP or urgent care if it remains elevated as he is currently asymptomatic regarding blood pressure.  Patient verbalized understanding and was agreeable with plan. Final Clinical Impressions(s) / UC Diagnoses   Final diagnoses:  Bacterial  conjunctivitis of left eye     Discharge Instructions      You have pink eye. I have prescribed antibiotic to apply to eye.  Follow-up if symptoms persist or worsen    ED Prescriptions     Medication Sig Dispense Auth. Provider   erythromycin ophthalmic ointment Place a 1/2 inch ribbon of ointment into the lower eyelid 4 times daily for 7 days. 3.5 g Gustavus Bryant, [place]      PDMP not reviewed this encounter.   Gustavus Bryant, [place] 02/23/23 [phone]

## 2023-02-23 NOTE — Discharge Instructions (Signed)
You have pink eye. I have prescribed antibiotic to apply to eye.  Follow-up if symptoms persist or worsen

## 2023-02-26 ENCOUNTER — Telehealth: Payer: Self-pay | Admitting: *Deleted

## 2023-02-26 MED ORDER — ERYTHROMYCIN 5 MG/GM OP OINT: TOPICAL_OINTMENT | OPHTHALMIC | 0 refills | Status: DC

## 2023-02-26 NOTE — Telephone Encounter (Signed)
Pt presents to [facility] stating he lost his erythromycin oint 1 days after starting to use it, and now he's also getting symtoms in his other eye; was told by pharmacy that he would require a new Rx since he lost the original one. Discussed with A. White, NP -- [facility] will send over the same Rx again to his pharmacy of choice, but if pt loses med again, he will have to have another visit encounter. Pt verbalized understanding and gratitude, and states he will use in both eyes.

## 2023-03-03 ENCOUNTER — Ambulatory Visit
Admission: EM | Admit: 2023-03-03 | Discharge: 2023-03-03 | Disposition: A | Discharge status: Home / Self Care | Payer: No Typology Code available for payment source | Attending: Internal Medicine | Admitting: Internal Medicine

## 2023-03-03 DIAGNOSIS — H1013 Acute atopic conjunctivitis, bilateral: Secondary | ICD-10-CM

## 2023-03-03 DIAGNOSIS — J302 Other seasonal allergic rhinitis: Secondary | ICD-10-CM

## 2023-03-03 DIAGNOSIS — R051 Acute cough: Secondary | ICD-10-CM

## 2023-03-03 MED ORDER — CETIRIZINE HCL 10 MG PO TABS: 10.0000 mg | ORAL_TABLET | Freq: Every day | ORAL | 0 refills | Status: AC

## 2023-03-03 MED ORDER — OLOPATADINE HCL 0.2 % OP SOLN: 1.0000 [drp] | Freq: Every day | OPHTHALMIC | 0 refills | Status: AC

## 2023-03-03 NOTE — Discharge Instructions (Signed)
You were seen today for eye discharge, itching, watering, runny nose and cough.  Your symptoms are more consistent with allergies and allergic conjunctivitis.  You may apply cool compresses 3 times daily as needed.  I am putting you on an antihistamine eyedrop daily for the next 7 days.  I am also starting you on antihistamine daily to help with your runny nose and cough.  Please follow-up if your symptoms persist or worsen.

## 2023-03-03 NOTE — ED Provider Notes (Signed)
[facility]    CSN: [id number] Arrival date & time: 03/03/23  1139      History   Chief Complaint Chief Complaint  Patient presents with   Nasal Congestion   Eye Drainage    HPI Anthony Barnes is a 67 y.o. male with a history of HTN, HLD and DM2 presents to urgent care today with complaint of bilateral eye crusting, blurred vision, eye itching and watering.  He reports his symptoms started this morning.  He reports associated runny nose and cough.  He is blowing clear mucus out of his nose.  The cough is productive of yellow/green mucus at times.  He denies headache, sinus pressure, ear pain, sore throat, shortness of breath, chest pain, nausea, vomiting or diarrhea.  He denies fever, chills or body aches.  He was seen 7/12 for similar symptoms and treated with erythromycin eye ointment.  He reports his symptoms did improve but it seems like it since he stopped the antibiotic eye ointment, his symptoms returned.  HPI  Past Medical History:  Diagnosis Date   Diabetes mellitus without complication (HCC)    Hypertension     Patient Active Problem List   Diagnosis Date Noted   Hyperlipidemia 06/25/2018   Morbid obesity (HCC) 03/13/2016   Dental caries 09/09/2015   Diabetes (HCC) 10/20/2013   DM (diabetes mellitus) type 2, uncontrolled, with ketoacidosis (HCC) 04/01/2013   Essential hypertension, benign 04/01/2013   GERD (gastroesophageal reflux disease) 04/01/2013   Erosive gastritis 03/22/2013   DM2 (diabetes mellitus, type 2) (HCC) 03/21/2013   Unspecified essential hypertension 03/21/2013   Hyponatremia 03/21/2013   Epigastric pain 03/20/2013   HTN (hypertension) 03/20/2013    Past Surgical History:  Procedure Laterality Date   BRAIN SURGERY     ESOPHAGOGASTRODUODENOSCOPY N/A 03/22/2013   Procedure: ESOPHAGOGASTRODUODENOSCOPY (EGD);  Surgeon: Graylin Shiver, MD;  Location: [facility] ENDOSCOPY;  Service: Endoscopy;  Laterality: N/A;       Home Medications     Prior to Admission medications   Medication Sig Start Date End Date Taking? Authorizing Provider  cetirizine (ZYRTEC ALLERGY) 10 MG tablet Take 1 tablet (10 mg total) by mouth daily. 03/03/23  Yes Umeka Wrench, Salvadore Oxford, NP  Olopatadine HCl 0.2 % SOLN Apply 1 drop to eye daily. 03/03/23  Yes Lorre Munroe, NP  amLODipine (NORVASC) 10 MG tablet Take 1 tablet (10 mg total) by mouth daily. 07/25/22   Hoy Register, MD  atorvastatin (LIPITOR) 40 MG tablet Take 1 tablet (40 mg total) by mouth daily. Patient not taking: Reported on 10/17/2022 01/24/22   Hoy Register, MD  bacitracin ointment Apply 1 application topically 2 (two) times daily. Patient not taking: Reported on 10/17/2022 04/29/21   Ivette Loyal, NP  Blood Glucose Monitoring Suppl (TRUE METRIX METER) w/Device KIT Check blood sugar 3 times daily. 12/18/19   Hoy Register, MD  carvedilol (COREG) 25 MG tablet Take 1 tablet (25 mg total) by mouth 2 (two) times daily with a meal. 07/25/22   Hoy Register, MD  cloNIDine (CATAPRES) 0.3 MG tablet Take 1 tablet (0.3 mg total) by mouth 2 (two) times daily. 10/17/22   Hoy Register, MD  erythromycin ophthalmic ointment Place a 1/2 inch ribbon of ointment into the lower eyelid 4 times daily for 7 days. 02/26/23   White, Elita Boone, NP  gabapentin (NEURONTIN) 300 MG capsule Take 1 capsule (300 mg total) by mouth at bedtime. Patient not taking: Reported on 10/17/2022 01/24/22   Hoy Register, MD  glipiZIDE (GLUCOTROL) 10 MG tablet TAKE ONE TABLET BY MOUTH TWICE DAILY BEFORE MEALS 01/26/23   Hoy Register, MD  ibuprofen (ADVIL,MOTRIN) 800 MG tablet Take 1 tablet (800 mg total) by mouth every 12 (twelve) hours as needed. 08/01/18   Hoy Register, MD  lisinopril-hydrochlorothiazide (ZESTORETIC) 20-12.5 MG tablet TAKE TWO TABLETS BY MOUTH DAILY 01/26/23   Hoy Register, MD  Multiple Vitamin (MULTIVITAMIN WITH MINERALS) TABS tablet Take 1 tablet by mouth daily.    [provider]  omega-3 acid ethyl  esters (LOVAZA) 1 g capsule Take 1 g by mouth 2 (two) times daily.    [provider]  pioglitazone (ACTOS) 15 MG tablet Take 1 tablet (15 mg total) by mouth daily. 10/17/22   Hoy Register, MD  traMADol (ULTRAM) 50 MG tablet TAKE ONE TABLET BY MOUTH TWICE DAILY BEFORE AND AFTER DEBRIDEMENT PROCEDURE Patient not taking: Reported on 10/17/2022 05/19/22   Hoy Register, MD  TRUEplus Lancets 28G MISC CHECK BLOOD SUGAR 3 TIMES DAILY. 02/22/21   Hoy Register, MD    Family History Family History  Problem Relation Age of Onset   Hypertension Mother    Cancer Mother 3       ovarian   Diabetes Father    Stroke Father     Social History Social History   Tobacco Use   Smoking status: Former    Current packs/day: 0.00    Average packs/day: 1 pack/day for 16.0 years (16.0 ttl pk-yrs)    Types: Cigarettes    Start date: 08/14/1974    Quit date: 08/14/1990    Years since quitting: 32.5   Smokeless tobacco: Former    Quit date: 08/14/1990  Vaping Use   Vaping status: Never Used  Substance Use Topics   Alcohol use: Not Currently    Comment: weekends only   Drug use: No     Allergies   Shellfish allergy and Codeine   Review of Systems Review of Systems  Constitutional:  Negative for chills and fever.  HENT:  Positive for rhinorrhea. Negative for congestion, ear pain, sinus pressure, sinus pain and sore throat.   Eyes:  Positive for discharge, itching and visual disturbance. Negative for pain and redness.  Respiratory:  Positive for cough. Negative for shortness of breath.   Cardiovascular:  Negative for chest pain.  Gastrointestinal:  Negative for diarrhea, nausea and vomiting.  Musculoskeletal:  Negative for arthralgias and myalgias.  Skin:  Negative for rash.  Neurological:  Negative for dizziness, weakness and headaches.     Physical Exam Triage Vital Signs ED Triage Vitals [03/03/23 1151]  Encounter Vitals Group     BP (!) 158/105     Systolic BP Percentile       Diastolic BP Percentile      Pulse Rate 87     Resp 20     Temp 98 F (36.7 C)     Temp Source Oral     SpO2 95 %     Weight      Height      Head Circumference      Peak Flow      Pain Score 0     Pain Loc      Pain Education      Exclude from Growth Chart    No data found.  Updated Vital Signs BP (!) 158/105 (BP Location: Left Arm)   Pulse 87   Temp 98 F (36.7 C) (Oral)   Resp 20   SpO2 95%  Physical Exam Constitutional:      General: He is not in acute distress.    Appearance: He is obese.  HENT:     Head: Normocephalic.     Comments: No sinus pressure noted    Right Ear: Tympanic membrane, ear canal and external ear normal.     Left Ear: Tympanic membrane, ear canal and external ear normal.     Nose: Congestion present. No rhinorrhea.     Mouth/Throat:     Mouth: Mucous membranes are moist.     Pharynx: No posterior oropharyngeal erythema.  Eyes:     Extraocular Movements: Extraocular movements intact.     Conjunctiva/sclera: Conjunctivae normal.     Pupils: Pupils are equal, round, and reactive to light.     Comments: Conjunctiva not air with numbness.  No scleral injection noted.  Small amount of discharge noted at the inner canthus.  Cardiovascular:     Rate and Rhythm: Normal rate and regular rhythm.     Heart sounds: Normal heart sounds.  Pulmonary:     Effort: Pulmonary effort is normal.     Breath sounds: Normal breath sounds.  Lymphadenopathy:     Cervical: No cervical adenopathy.  Skin:    General: Skin is warm and dry.     Findings: No rash.  Neurological:     General: No focal deficit present.     Mental Status: He is alert and oriented to person, place, and time.      UC Treatments / Results  Labs  EKG   Radiology No results found.  Procedures Procedures (including critical care time)  Medications Ordered in UC Medications - No data to display  Initial Impression / Assessment and Plan / UC Course  I have reviewed the  triage vital signs and the nursing notes.  Pertinent labs & imaging results that were available during my care of the patient were reviewed by me and considered in my medical decision making (see chart for details).     67 year old male with 1 day history of eye itching, watery discharge, blurred vision, runny nose and cough.  His exam is more consistent with allergies as opposed to bacterial conjunctivitis which she was recently treated for.  Will have him start Zyrtec 10 mg daily x 7 days.  Rx for Pataday 1 drop both eyes daily x 7 days.  No indication to repeat antibiotic eyedrops at this time.  Recommend he follow-up with his PCP if symptoms persist or worsen.  Final Clinical Impressions(s) / UC Diagnoses   Final diagnoses:  Allergic conjunctivitis of both eyes  Seasonal allergic rhinitis, unspecified trigger  Acute cough     Discharge Instructions      You were seen today for eye discharge, itching, watering, runny nose and cough.  Your symptoms are more consistent with allergies and allergic conjunctivitis.  You may apply cool compresses 3 times daily as needed.  I am putting you on an antihistamine eyedrop daily for the next 7 days.  I am also starting you on antihistamine daily to help with your runny nose and cough.  Please follow-up if your symptoms persist or worsen.     ED Prescriptions     Medication Sig Dispense Auth. Provider   Olopatadine HCl 0.2 % SOLN Apply 1 drop to eye daily. 2.5 mL Lorre Munroe, NP   cetirizine (ZYRTEC ALLERGY) 10 MG tablet Take 1 tablet (10 mg total) by mouth daily. 7 tablet Lorre Munroe, NP  PDMP not reviewed this encounter.   Lorre Munroe, NP 03/03/23 1207

## 2023-03-03 NOTE — ED Triage Notes (Signed)
Patient with c/o cough and runny nose. States he was recently tx for pink eye and thought it was better but the eye drainage has returned in both eyes.

## 2023-03-06 ENCOUNTER — Other Ambulatory Visit: Payer: Self-pay | Admitting: Family Medicine

## 2023-03-06 DIAGNOSIS — E1149 Type 2 diabetes mellitus with other diabetic neurological complication: Secondary | ICD-10-CM

## 2023-03-06 DIAGNOSIS — I152 Hypertension secondary to endocrine disorders: Secondary | ICD-10-CM

## 2023-05-09 ENCOUNTER — Other Ambulatory Visit: Payer: Self-pay

## 2023-05-09 NOTE — Progress Notes (Addendum)
Anthony Barnes [birth year] [id number]  Patient attempted to be outreached by Thomasene Ripple, PharmD Candidate to discuss hypertension.   Thomasene Ripple, Student-PharmD

## 2023-05-16 ENCOUNTER — Ambulatory Visit: Payer: No Typology Code available for payment source | Attending: Family Medicine | Admitting: Family Medicine

## 2023-05-16 ENCOUNTER — Encounter: Payer: Self-pay | Admitting: Family Medicine

## 2023-05-16 VITALS — BP 138/88 | HR 72 | Ht 76.0 in | Wt 305.8 lb

## 2023-05-16 DIAGNOSIS — Z7984 Long term (current) use of oral hypoglycemic drugs: Secondary | ICD-10-CM | POA: Diagnosis not present

## 2023-05-16 DIAGNOSIS — E785 Hyperlipidemia, unspecified: Secondary | ICD-10-CM | POA: Diagnosis not present

## 2023-05-16 DIAGNOSIS — E1159 Type 2 diabetes mellitus with other circulatory complications: Secondary | ICD-10-CM

## 2023-05-16 DIAGNOSIS — E1149 Type 2 diabetes mellitus with other diabetic neurological complication: Secondary | ICD-10-CM

## 2023-05-16 DIAGNOSIS — I152 Hypertension secondary to endocrine disorders: Secondary | ICD-10-CM

## 2023-05-16 DIAGNOSIS — E1169 Type 2 diabetes mellitus with other specified complication: Secondary | ICD-10-CM

## 2023-05-16 LAB — POCT GLYCOSYLATED HEMOGLOBIN (HGB A1C): HbA1c, POC (controlled diabetic range): 8.5 % — AB (ref 0.0–7.0)

## 2023-05-16 MED ORDER — CARVEDILOL 25 MG PO TABS
12.5000 mg | ORAL_TABLET | Freq: Two times a day (BID) | ORAL | 1 refills | Status: DC
Start: 2023-05-16 — End: 2023-08-21

## 2023-05-16 MED ORDER — LISINOPRIL-HYDROCHLOROTHIAZIDE 20-12.5 MG PO TABS
2.0000 | ORAL_TABLET | Freq: Every day | ORAL | 1 refills | Status: DC
Start: 2023-05-16 — End: 2023-08-21

## 2023-05-16 MED ORDER — AMLODIPINE BESYLATE 10 MG PO TABS
10.0000 mg | ORAL_TABLET | Freq: Every day | ORAL | 1 refills | Status: DC
Start: 2023-05-16 — End: 2023-12-18

## 2023-05-16 MED ORDER — ATORVASTATIN CALCIUM 40 MG PO TABS
40.0000 mg | ORAL_TABLET | Freq: Every day | ORAL | 1 refills | Status: DC
Start: 2023-05-16 — End: 2023-12-05

## 2023-05-16 MED ORDER — PIOGLITAZONE HCL 15 MG PO TABS
15.0000 mg | ORAL_TABLET | Freq: Every day | ORAL | 1 refills | Status: DC
Start: 2023-05-16 — End: 2023-08-21

## 2023-05-16 MED ORDER — GLIPIZIDE 10 MG PO TABS
ORAL_TABLET | ORAL | 1 refills | Status: DC
Start: 2023-05-16 — End: 2023-12-05

## 2023-05-16 NOTE — Patient Instructions (Signed)
Managing Your Hypertension Hypertension, also called high blood pressure, is when the force of the blood pressing against the walls of the arteries is too strong. Arteries are blood vessels that carry blood from your heart throughout your body. Hypertension forces the heart to work harder to pump blood and may cause the arteries to become narrow or stiff. Understanding blood pressure readings A blood pressure reading includes a higher number over a lower number: The first, or top, number is called the systolic pressure. It is a measure of the pressure in your arteries as your heart beats. The second, or bottom number, is called the diastolic pressure. It is a measure of the pressure in your arteries as the heart relaxes. For most people, a normal blood pressure is below 120/80. Your personal target blood pressure may vary depending on your medical conditions, your age, and other factors. Blood pressure is classified into four stages. Based on your blood pressure reading, your health care provider may use the following stages to determine what type of treatment you need, if any. Systolic pressure and diastolic pressure are measured in a unit called millimeters of mercury (mmHg). Normal Systolic pressure: below 120. Diastolic pressure: below 80. Elevated Systolic pressure: 120-129. Diastolic pressure: below 80. Hypertension stage 1 Systolic pressure: 130-139. Diastolic pressure: 80-89. Hypertension stage 2 Systolic pressure: 140 or above. Diastolic pressure: 90 or above. How can this condition affect me? Managing your hypertension is very important. Over time, hypertension can damage the arteries and decrease blood flow to parts of the body, including the brain, heart, and kidneys. Having untreated or uncontrolled hypertension can lead to: A heart attack. A stroke. A weakened blood vessel (aneurysm). Heart failure. Kidney damage. Eye damage. Memory and concentration problems. Vascular  dementia. What actions can I take to manage this condition? Hypertension can be managed by making lifestyle changes and possibly by taking medicines. Your health care provider will help you make a plan to bring your blood pressure within a normal range. You may be referred for counseling on a healthy diet and physical activity. Nutrition  Eat a diet that is high in fiber and potassium, and low in salt (sodium), added sugar, and fat. An example eating plan is called the DASH diet. DASH stands for Dietary Approaches to Stop Hypertension. To eat this way: Eat plenty of fresh fruits and vegetables. Try to fill one-half of your plate at each meal with fruits and vegetables. Eat whole grains, such as whole-wheat pasta, brown rice, or whole-grain bread. Fill about one-fourth of your plate with whole grains. Eat low-fat dairy products. Avoid fatty cuts of meat, processed or cured meats, and poultry with skin. Fill about one-fourth of your plate with lean proteins such as fish, chicken without skin, beans, eggs, and tofu. Avoid pre-made and processed foods. These tend to be higher in sodium, added sugar, and fat. Reduce your daily sodium intake. Many people with hypertension should eat less than 1,500 mg of sodium a day. Lifestyle  Work with your health care provider to maintain a healthy body weight or to lose weight. Ask what an ideal weight is for you. Get at least 30 minutes of exercise that causes your heart to beat faster (aerobic exercise) most days of the week. Activities may include walking, swimming, or biking. Include exercise to strengthen your muscles (resistance exercise), such as weight lifting, as part of your weekly exercise routine. Try to do these types of exercises for 30 minutes at least 3 days a week. Do   not use any products that contain nicotine or tobacco. These products include cigarettes, chewing tobacco, and vaping devices, such as e-cigarettes. If you need help quitting, ask your  health care provider. Control any long-term (chronic) conditions you have, such as high cholesterol or diabetes. Identify your sources of stress and find ways to manage stress. This may include meditation, deep breathing, or making time for fun activities. Alcohol use Do not drink alcohol if: Your health care provider tells you not to drink. You are pregnant, may be pregnant, or are planning to become pregnant. If you drink alcohol: Limit how much you have to: 0-1 drink a day for women. 0-2 drinks a day for men. Know how much alcohol is in your drink. In the [place] one drink equals one 12 oz bottle of beer (355 mL), one 5 oz glass of wine (148 mL), or one 1 oz glass of hard liquor (44 mL). Medicines Your health care provider may prescribe medicine if lifestyle changes are not enough to get your blood pressure under control and if: Your systolic blood pressure is 130 or higher. Your diastolic blood pressure is 80 or higher. Take medicines only as told by your health care provider. Follow the directions carefully. Blood pressure medicines must be taken as told by your health care provider. The medicine does not work as well when you skip doses. Skipping doses also puts you at risk for problems. Monitoring Before you monitor your blood pressure: Do not smoke, drink caffeinated beverages, or exercise within 30 minutes before taking a measurement. Use the bathroom and empty your bladder (urinate). Sit quietly for at least 5 minutes before taking measurements. Monitor your blood pressure at home as told by your health care provider. To do this: Sit with your back straight and supported. Place your feet flat on the floor. Do not cross your legs. Support your arm on a flat surface, such as a table. Make sure your upper arm is at heart level. Each time you measure, take two or three readings one minute apart and record the results. You may also need to have your blood pressure checked regularly by  your health care provider. General information Talk with your health care provider about your diet, exercise habits, and other lifestyle factors that may be contributing to hypertension. Review all the medicines you take with your health care provider because there may be side effects or interactions. Keep all follow-up visits. Your health care provider can help you create and adjust your plan for managing your high blood pressure. Where to find more information National Heart, Lung, and Blood Institute: www.nhlbi.nih.gov American Heart Association: www.heart.org Contact a health care provider if: You think you are having a reaction to medicines you have taken. You have repeated (recurrent) headaches. You feel dizzy. You have swelling in your ankles. You have trouble with your vision. Get help right away if: You develop a severe headache or confusion. You have unusual weakness or numbness, or you feel faint. You have severe pain in your chest or abdomen. You vomit repeatedly. You have trouble breathing. These symptoms may be an emergency. Get help right away. Call 911. Do not wait to see if the symptoms will go away. Do not drive yourself to the hospital. Summary Hypertension is when the force of blood pumping through your arteries is too strong. If this condition is not controlled, it may put you at risk for serious complications. Your personal target blood pressure may vary depending on your medical conditions,   your age, and other factors. For most people, a normal blood pressure is less than 120/80. Hypertension is managed by lifestyle changes, medicines, or both. Lifestyle changes to help manage hypertension include losing weight, eating a healthy, low-sodium diet, exercising more, stopping smoking, and limiting alcohol. This information is not intended to replace advice given to you by your health care provider. Make sure you discuss any questions you have with your health care  provider. Document Revised: 04/14/2021 Document Reviewed: 04/14/2021 Elsevier Patient Education  2024 [employer]  

## 2023-05-16 NOTE — Progress Notes (Signed)
Subjective:  Patient ID: Anthony Barnes, male    DOB: 02/17/1956  Age: 67 y.o. MRN: [id number]  CC: Medical Management of Chronic Issues   HPI Anthony Barnes is a 67 y.o. year old male with a history of hypertension, type 2 diabetes mellitus A1c ( A1c 8.5), morbid obesity here for follow-up visit.   Interval History: Discussed the use of AI scribe software for clinical note transcription with the patient, who gave verbal consent to proceed.  He presents with elevated blood pressure and has been taking half of the prescribed dose of carvedilol 25mg  twice daily. He reports feeling "funny" when taking the full dose, but has not been monitoring his blood pressure at home to provide objective data.  He also admits to non-adherence with the prescribed Actos 15mg  daily for diabetes control.  He has been taking glipizide.  He had declined any injectable at his last visit and continues to resist that.  He is unable to tolerate metformin. The patient's A1c has decreased slightly from 8.8 to 8.5, but he acknowledges poor dietary habits, particularly over the summer, and a trend of "eating off the grid."  He also reports ongoing neuropathy, primarily in the leg affected by a previous accident, but also in the unaffected leg. The neuropathy is described as mild compared to the initial pain experienced post-accident. He has been prescribed gabapentin for this issue but has not been taking it, preferring to manage the symptoms without medication unless they increase in severity.        Past Medical History:  Diagnosis Date   Diabetes mellitus without complication (HCC)    Hypertension     Past Surgical History:  Procedure Laterality Date   BRAIN SURGERY     ESOPHAGOGASTRODUODENOSCOPY N/A 03/22/2013   Procedure: ESOPHAGOGASTRODUODENOSCOPY (EGD);  Surgeon: Graylin Shiver, MD;  Location: [facility];  Service: Endoscopy;  Laterality: N/A;    Family History  Problem Relation Age of Onset    Hypertension Mother    Cancer Mother 79       ovarian   Diabetes Father    Stroke Father     Social History   Socioeconomic History   Marital status: Married    Spouse name: Not on file   Number of children: Not on file   Years of education: Not on file   Highest education level: Not on file  Occupational History   Not on file  Tobacco Use   Smoking status: Former    Current packs/day: 0.00    Average packs/day: 1 pack/day for 16.0 years (16.0 ttl pk-yrs)    Types: Cigarettes    Start date: 08/14/1974    Quit date: 08/14/1990    Years since quitting: 32.7   Smokeless tobacco: Former    Quit date: 08/14/1990  Vaping Use   Vaping status: Never Used  Substance and Sexual Activity   Alcohol use: Not Currently    Comment: weekends only   Drug use: No   Sexual activity: Not on file  Other Topics Concern   Not on file  Social History Narrative   Not on file   Social Determinants of Health   Financial Resource Strain: Not on file  Food Insecurity: Not on file  Transportation Needs: Not on file  Physical Activity: Not on file  Stress: Not on file  Social Connections: Not on file    Allergies  Allergen Reactions   Shellfish Allergy Anaphylaxis   Codeine Itching    unknown  Outpatient Medications Prior to Visit  Medication Sig Dispense Refill   bacitracin ointment Apply 1 application topically 2 (two) times daily. (Patient not taking: Reported on 10/17/2022) 120 g 0   Blood Glucose Monitoring Suppl (TRUE METRIX METER) w/Device KIT Check blood sugar 3 times daily. 1 kit 0   cetirizine (ZYRTEC ALLERGY) 10 MG tablet Take 1 tablet (10 mg total) by mouth daily. 7 tablet 0   cloNIDine (CATAPRES) 0.3 MG tablet TAKE ONE TABLET BY MOUTH TWICE DAILY 180 tablet 0   erythromycin ophthalmic ointment Place a 1/2 inch ribbon of ointment into the lower eyelid 4 times daily for 7 days. 3.5 g 0   gabapentin (NEURONTIN) 300 MG capsule Take 1 capsule (300 mg total) by mouth at bedtime.  (Patient not taking: Reported on 10/17/2022) 90 capsule 1   ibuprofen (ADVIL,MOTRIN) 800 MG tablet Take 1 tablet (800 mg total) by mouth every 12 (twelve) hours as needed. 30 tablet 0   Multiple Vitamin (MULTIVITAMIN WITH MINERALS) TABS tablet Take 1 tablet by mouth daily.     Olopatadine HCl 0.2 % SOLN Apply 1 drop to eye daily. 2.5 mL 0   omega-3 acid ethyl esters (LOVAZA) 1 g capsule Take 1 g by mouth 2 (two) times daily.     traMADol (ULTRAM) 50 MG tablet TAKE ONE TABLET BY MOUTH TWICE DAILY BEFORE AND AFTER DEBRIDEMENT PROCEDURE (Patient not taking: Reported on 10/17/2022) 7 tablet 0   TRUEplus Lancets 28G MISC CHECK BLOOD SUGAR 3 TIMES DAILY. 100 each 2   amLODipine (NORVASC) 10 MG tablet TAKE ONE TABLET BY MOUTH ONE TIME DAILY 90 tablet 0   atorvastatin (LIPITOR) 40 MG tablet Take 1 tablet (40 mg total) by mouth daily. (Patient not taking: Reported on 10/17/2022) 90 tablet 1   carvedilol (COREG) 25 MG tablet Take 1 tablet (25 mg total) by mouth 2 (two) times daily with a meal. 180 tablet 1   glipiZIDE (GLUCOTROL) 10 MG tablet TAKE ONE TABLET BY MOUTH TWICE DAILY BEFORE MEALS 180 tablet 0   lisinopril-hydrochlorothiazide (ZESTORETIC) 20-12.5 MG tablet TAKE TWO TABLETS BY MOUTH DAILY 180 tablet 0   pioglitazone (ACTOS) 15 MG tablet Take 1 tablet (15 mg total) by mouth daily. 90 tablet 1   No facility-administered medications prior to visit.     ROS Review of Systems  Constitutional:  Negative for activity change and appetite change.  HENT:  Negative for sinus pressure and sore throat.   Respiratory:  Negative for chest tightness, shortness of breath and wheezing.   Cardiovascular:  Negative for chest pain and palpitations.  Gastrointestinal:  Negative for abdominal distention, abdominal pain and constipation.  Genitourinary: Negative.   Musculoskeletal: Negative.   Psychiatric/Behavioral:  Negative for behavioral problems and dysphoric mood.     Objective:  BP 138/88   Pulse 72   Ht  6\' 4"  (1.93 m)   Wt (!) 305 lb 12.8 oz (138.7 kg)   SpO2 97%   BMI 37.22 kg/m      05/16/2023    4:06 PM 05/16/2023    3:37 PM 03/03/2023   11:51 AM  BP/Weight  Systolic BP 138 168 158  Diastolic BP 88 91 105  Wt. (Lbs)  305.8   BMI  37.22 kg/m2       Physical Exam Constitutional:      Appearance: He is well-developed.  Cardiovascular:     Rate and Rhythm: Normal rate.     Heart sounds: Normal heart sounds. No murmur heard. Pulmonary:  Effort: Pulmonary effort is normal.     Breath sounds: Normal breath sounds. No wheezing or rales.  Chest:     Chest wall: No tenderness.  Abdominal:     General: Bowel sounds are normal. There is no distension.     Palpations: Abdomen is soft. There is no mass.     Tenderness: There is no abdominal tenderness.  Musculoskeletal:        General: Normal range of motion.     Right lower leg: No edema.     Left lower leg: No edema.  Neurological:     Mental Status: He is alert and oriented to person, place, and time.  Psychiatric:        Mood and Affect: Mood normal.        Latest Ref Rng & Units 10/17/2022    4:43 PM 12/20/2021    2:13 PM 07/20/2020    9:03 AM  CMP  Glucose 70 - 99 mg/dL 409  811  914   BUN 8 - 27 mg/dL 22  15  16    Creatinine 0.76 - 1.27 mg/dL 7.82  9.56  2.13   Sodium 134 - 144 mmol/L 138  134  138   Potassium 3.5 - 5.2 mmol/L 4.1  3.5  4.5   Chloride 96 - 106 mmol/L 100  101  101   CO2 20 - 29 mmol/L 22  25  24    Calcium 8.6 - 10.2 mg/dL 9.9  9.2  9.7   Total Protein 6.0 - 8.5 g/dL 7.9   7.4   Total Bilirubin 0.0 - 1.2 mg/dL 0.3   0.4   Alkaline Phos 44 - 121 IU/L 51   46   AST 0 - 40 IU/L 20   16   ALT 0 - 44 IU/L 26   20     Lipid Panel     Component Value Date/Time   CHOL 156 07/20/2020 0903   TRIG 48 07/20/2020 0903   HDL 49 07/20/2020 0903   CHOLHDL 3.2 07/20/2020 0903   CHOLHDL 3.2 07/21/2016 1051   VLDL 10 07/21/2016 1051   LDLCALC 97 07/20/2020 0903    CBC    Component Value Date/Time    WBC 8.1 11/25/2017 0407   RBC 4.82 11/25/2017 0407   HGB 13.5 11/25/2017 0407   HCT 39.1 11/25/2017 0407   PLT 211 11/25/2017 0407   MCV 81.1 11/25/2017 0407   MCH 28.0 11/25/2017 0407   MCHC 34.5 11/25/2017 0407   RDW 15.4 11/25/2017 0407   LYMPHSABS 1.8 10/20/2013 1250   MONOABS 0.7 10/20/2013 1250   EOSABS 0.1 10/20/2013 1250   BASOSABS 0.0 10/20/2013 1250    Lab Results  Component Value Date   HGBA1C 8.5 (A) 05/16/2023    Assessment & Plan:      Hypertension Elevated blood pressure in the office. Patient reports taking half of the prescribed dose of Carvedilol 25mg  due to feeling unwell with the full dose. No home blood pressure monitoring. Repeat blood pressure normalized to 138/88 -Continue Carvedilol 25mg  half tablet twice daily as tolerated. -Encouraged to purchase a home blood pressure monitor and check blood pressure at home. -Counseled on blood pressure goal of less than 130/80, low-sodium, DASH diet, medication compliance, 150 minutes of moderate intensity exercise per week. Discussed medication compliance, adverse effects.   Type 2 Diabetes Mellitus A1c 8.5, slight improvement from 8.8. Patient admits to poor dietary habits. -He has been nonadherent with Actos -Continue Glipizide 10mg  twice daily. -  Add Actos 15mg  daily, prescription sent to [facility] pharmacy. He will benefit from a GLP-1 RA due to weight loss benefit but he declines  Neuropathy Reports mild neuropathy in the legs, more pronounced on the side of a previous accident. Not currently taking prescribed Gabapentin. -Continue to monitor symptoms. Patient prefers not to take medication at this time.  General Health Maintenance -Order cholesterol panel, last checked in 2021. -Encouraged patient to take an active role in managing health conditions to prevent complications.          Meds ordered this encounter  Medications   amLODipine (NORVASC) 10 MG tablet    Sig: Take 1 tablet (10 mg total)  by mouth daily.    Dispense:  90 tablet    Refill:  1   atorvastatin (LIPITOR) 40 MG tablet    Sig: Take 1 tablet (40 mg total) by mouth daily.    Dispense:  90 tablet    Refill:  1   carvedilol (COREG) 25 MG tablet    Sig: Take 0.5 tablets (12.5 mg total) by mouth 2 (two) times daily with a meal.    Dispense:  180 tablet    Refill:  1    Dispense 90-day supply; dose change   glipiZIDE (GLUCOTROL) 10 MG tablet    Sig: TAKE ONE TABLET BY MOUTH TWICE DAILY BEFORE MEALS    Dispense:  180 tablet    Refill:  1   lisinopril-hydrochlorothiazide (ZESTORETIC) 20-12.5 MG tablet    Sig: Take 2 tablets by mouth daily.    Dispense:  180 tablet    Refill:  1   pioglitazone (ACTOS) 15 MG tablet    Sig: Take 1 tablet (15 mg total) by mouth daily.    Dispense:  90 tablet    Refill:  1    Follow-up: Return in about 3 months (around 08/16/2023) for Chronic medical conditions.       Hoy Register, MD, FAAFP. [facility] [place], [place] [phone]   05/16/2023, 5:40 PM

## 2023-05-18 ENCOUNTER — Other Ambulatory Visit: Payer: No Typology Code available for payment source

## 2023-05-23 ENCOUNTER — Telehealth: Payer: Self-pay | Admitting: Pharmacist

## 2023-05-23 NOTE — Telephone Encounter (Signed)
LIBERATE Study  Received referral for patient participation in the LIBERATE CGM Study. Contacted patient to discuss study but he was unavailable. Left HIPAA-compliant VM with instructions to return my call.   Butch Penny, PharmD, P[place]y [place], CPP Clinical Pharmacist [facility] [phone]

## 2023-06-25 ENCOUNTER — Encounter (HOSPITAL_BASED_OUTPATIENT_CLINIC_OR_DEPARTMENT_OTHER): Payer: No Typology Code available for payment source | Attending: Internal Medicine | Admitting: Internal Medicine

## 2023-06-25 DIAGNOSIS — Z87891 Personal history of nicotine dependence: Secondary | ICD-10-CM | POA: Diagnosis not present

## 2023-06-25 DIAGNOSIS — S80812A Abrasion, left lower leg, initial encounter: Secondary | ICD-10-CM | POA: Diagnosis not present

## 2023-06-25 DIAGNOSIS — E11622 Type 2 diabetes mellitus with other skin ulcer: Secondary | ICD-10-CM | POA: Diagnosis not present

## 2023-06-25 DIAGNOSIS — L97828 Non-pressure chronic ulcer of other part of left lower leg with other specified severity: Secondary | ICD-10-CM | POA: Diagnosis not present

## 2023-06-25 DIAGNOSIS — E1142 Type 2 diabetes mellitus with diabetic polyneuropathy: Secondary | ICD-10-CM | POA: Insufficient documentation

## 2023-06-25 DIAGNOSIS — E1151 Type 2 diabetes mellitus with diabetic peripheral angiopathy without gangrene: Secondary | ICD-10-CM | POA: Diagnosis not present

## 2023-06-25 DIAGNOSIS — I890 Lymphedema, not elsewhere classified: Secondary | ICD-10-CM | POA: Diagnosis not present

## 2023-06-25 DIAGNOSIS — I872 Venous insufficiency (chronic) (peripheral): Secondary | ICD-10-CM | POA: Diagnosis not present

## 2023-06-25 DIAGNOSIS — I10 Essential (primary) hypertension: Secondary | ICD-10-CM | POA: Diagnosis not present

## 2023-06-25 NOTE — Progress Notes (Signed)
MCADOO, LIVERPOOL ([id number]) [id number] Page 1 of 14 Visit Report for 06/25/2023 Chief Complaint Document Details Patient Name: Date of Service: Anthony Barnes [name] 06/25/2023 12:30 PM Medical Record Number: [id number] Patient Account Number: [id number] Date of Birth/Sex: Treating RN: February 06, 1956 (67 y.o. M) Primary Care Provider: Hoy Register Other Clinician: Referring Provider: Treating Provider/Extender: Luster Landsberg, Odette Horns Weeks in Treatment: 0 Information Obtained from: Patient Chief Complaint Right LE Ulcer 06/25/2023; patient returns to clinic with a wound on his left anterior mid tibia Electronic Signature(s) Signed: 06/25/2023 4:37:17 PM By: Baltazar Najjar MD Entered By: Baltazar Najjar on 06/25/2023 10:23:33 -------------------------------------------------------------------------------- Debridement Details Patient Name: Date of Service: Anthony Barnes, [name] 06/25/2023 12:30 PM Medical Record Number: [id number] Patient Account Number: [id number] Date of Birth/Sex: Treating RN: [birth year] (67 y.o. M) Primary Care Provider: Hoy Register Other Clinician: Referring Provider: Treating Provider/Extender: Josephine Cables Weeks in Treatment: 0 Debridement Performed for Assessment: Wound #3 Left,Anterior Lower Leg Performed By: Physician Maxwell Caul., MD The following information was scribed by: Redmond Pulling The information was scribed for: Baltazar Najjar Debridement Type: Debridement Severity of Tissue Pre Debridement: Fat layer exposed Level of Consciousness (Pre-procedure): Awake and Alert Pre-procedure Verification/Time Out Yes - 13:10 Taken: Start Time: 13:10 Pain Control: Lidocaine 4% T opical Solution Percent of Wound Bed Debrided: 100% T Area Debrided (cm): otal 0.94 Tissue and other material debrided: Non-Viable, Slough, Slough Level: Non-Viable Tissue Debridement Description:  Selective/Open Wound Instrument: Curette Bleeding: Minimum Hemostasis Achieved: Pressure Response to Treatment: Procedure was tolerated well Level of Consciousness (Post- Awake and Alert procedure): Post Debridement Measurements of Total Wound Length: (cm) 1.2 Width: (cm) 1 Depth: (cm) 0.1 Volume: (cm) 0.094 Character of Wound/Ulcer Post Debridement: Improved Anthony Barnes ([id number]) [id number] Page 2 of 14 Severity of Tissue Post Debridement: Fat layer exposed Post Procedure Diagnosis Same as Pre-procedure Electronic Signature(s) Signed: 06/25/2023 4:37:17 PM By: Baltazar Najjar MD Entered By: Baltazar Najjar on 06/25/2023 10:23:12 -------------------------------------------------------------------------------- HPI Details Patient Name: Date of Service: Anthony Barnes, [name] 06/25/2023 12:30 PM Medical Record Number: [id number] Patient Account Number: [id number] Date of Birth/Sex: Treating RN: 09-22-1955 (67 y.o. M) Primary Care Provider: Hoy Register Other Clinician: Referring Provider: Treating Provider/Extender: Josephine Cables Weeks in Treatment: 0 History of Present Illness HPI Description: ADMISSION 03/12/18 This is a 67 year old and it was a type II diabetic reasonably poorly controlled with a recent hemoglobin A1c of 8.2. He was tending to his lawn on 01/15/18 when his weed Anthony Barnes sent a rock up word hitting him in the right anterior leg. He was seen in his primary M.D. office is same time. An x-ray showed no abnormality. He was given a course of cephalexin. Since then he's been applying peroxide and topical antibiotics to the wound. He does not have a history of chronic wounds however he does have chronic edema in the lower legs. He is complaining of pain in the right leg wound but no real history of claudication. Past medical history includes type 2 diabetes, hypertension, morbid obesity. ABIs in the right leg were  noncompressible 03/19/18 on evaluation today patient appears to be tolerating the wrap in general fairly well. He states he's not having that much pain in regard to the wound itself. With that being said he is having some issues with slough buildup on the surface of the wound the Iodoflex does seem to be helpful in that regard. He is still having discomfort he wonders if I can send in  a prescription for ibuprofen or something of like to help him out. I do believe that the prox end may be of benefit for him. 03/25/18 on evaluation today patient appears to be doing very well in regard to his right lateral lower [name]. He's been tolerating the dressing changes without complication that is the wraps. Nonetheless he does continue to have pain he states is been dreading the possibility of having to have debridement again today. His first debridement experience was not optimal. With that being said he does seem to be showing signs of improvement there does appear to be little bit more granulation he does have a lot of slough that really does need to breeding away. 04/04/18;the patient went for arterial studies that were really quite normal. ABI on the right at 1.19 with triphasic waveforms on the left 1.11 with triphasic waveforms. TBI 0.93 on the right and 0.95 on the left. This suggests T should be able to tolerate even 4 layer compression. He is however complaining of a lot of pain with our wraps I'm wondering whether the pain is simply Iodoflex. I changed him to [employer] I'm still going to try to keep him in 3 layer compression 04/12/18 on evaluation today patient actually appears to be doing rather well in regard to his right lower [name] ulcer. He is making good progress although it is slow still I do believe that the Santyl is much better than the Iodoflex. The good news is the patient seems to be tolerating the dressing changes without complication now that  we've gotten good of the Iodoflex which really did burn him quite significantly. Otherwise there's no evidence of infection. 04/17/18 on evaluation today patient actually appears to be showing signs of improvement in regard to the ulcer. Unfortunately he's had somewhat of a rough day due to the fact that he found out that one of his friends suddenly and unexpectedly passed today. He states he's not sure he's in the frame of mind to allow me to debride the wound at this point. Nonetheless I do feel like he is showing signs of the wound getting better little by little each week. 04/24/18 on evaluation today patient's ulcer actually appears to be showing some signs of improvement albeit slow. He has been tolerating the dressing changes without complication except for the wrap which she states was so tight that he had to remove it it was causing a lot of discomfort. Fortunately there is no evidence of infection at this point. He states he only wore the wrap until about the time he got home last week and that he had to take it off. Nonetheless he does not have any other compression stockings, Juxta-Lite wraps, or anything otherwise to really help at this point as far as compression is concerned. 05/01/18 on evaluation today patient actually appears to be doing fairly well in regard to his lower extremity ulcer. He has been tolerating the dressing changes currently without complication. The wrap does seem to be helping as far as the fluid management is concerned. Wound bed itself actually show signs of good improvement although there is some Slough noted there's not as much as previous and he actually has fairly decent granulation noted as well. Overall I'm pleased with the progress of to this point. The patient would prefer not to have any debridement today he states he's actually not feeling too well in general and he really does not want any additional discomfort typically debridement  is fairly uncomfortable  for him. 05/08/18 on evaluation today patient actually appears to be doing a little better in regard to his lower extremity ulcer. He has been tolerating the dressing changes without complication. With that being said I'm actually quite pleased with the fact the wound is not appear to be infected and in general has made a little progress. With that being said I do believe we need to perform some debridement to clean away the necrotic tissue on the surface of the wound. 05/15/18 on evaluation today patient actually appears to be doing a little better in regard to his wound. Fortunately there is some improvement noted fortunately there's also no significant evidence of infection at this point in time. He does have continued pain that really nothing different than previous he did get his Juxta-Lite wrap. 05/29/18 on evaluation today patient actually appears to be doing somewhat better in regard to his wound currently. He's been tolerating the dressing changes without complication. With that being said he has been performing the [facility] Dressing changes every day at home. I'm pretty sure that we told him KENYE, ROYE ([id number]) [phone].pdf Page 3 of 14 every other day last week or when I saw him rather two weeks ago. With that being said obviously did not hurt to do it daily just that obviously is gonna run out of supplies sooner and that could get him into trouble as far as his insurance is concerned. Nonetheless he at this point still continues to have pain although honestly I don't feel like it's as bad as what it was in the past just based on his reactions at this point. 06/12/18 on evaluation today patient actually appears to be doing better in regard to his lower [name] ulcer. He's been tolerating the dressing changes without complication. Fortunately there does not appear to be any evidence of infection at this time. No fevers, chills, nausea, or vomiting  noted at this time. 06/19/18 evaluation today patient's ulcer on the lower extremity appears to be doing better at this point. he has been tolerating the dressing changes. The patient seems to be somewhat depressed about the progress of his wound although the overall appearance at this time seems to be doing well. In general I'm very pleased with the appearance today he does have some biofilm on the surface of the wound as well as of hyper granular tissue we may want to utilize a little bit of silver nitrate today. 06/27/2018; patient comes into clinic today for a wound on the right lateral calf likely related to chronic venous insufficiency. He has been using medihoney covered with Hydrofera Blue. Wound surface actually looks quite good 07/03/2018 seen today for follow-up and management of lower extremity ulcer right lateral shin. T olerating dressing changes. He has a small amount of biofilm today. Will attempt to remove a portion of the biofilm as he tolerates. He becomes very anxious with wound treatments. He would benefit from layer compression wraps however he refuses compression wraps or anything that smokes his legs. He expressed an inability to tolerate compression wraps. Juxta lite wraps have been ordered. He has been instructed on the appropriate application of the juxta leg wraps. His blood pressure today on visit was 200/120. He denies any blurred vision, chest pain, dizziness, shortness of breath, or difficulty with mobility. Mr. Kirley stated that he had a recent visit with his primary care provider. At that time his carvedilol was decreased from 25 mg daily to 12.5 mg daily.  Strongly encouraged Mr. Mcmahen to seek medical attention today during visit. He declined transport for evaluation of elevated blood pressure. Encouraged him to contact his primary care provider today for medication management. He stated that he would call his primary care doctor after wound visit today. Also  encouraged him that if he experienced any symptoms of blurred vision, chest pain, shortness of breath to immediately seek medical attention. 07/17/18 on evaluation today patient's wound actually does seem to show signs of improvement based on the overall appearance of the wound bed today. There does not appear to show any signs of infection which is good news. There is no overall worsening which is also good news. He still has hyper granular tissue will use in the [employer] followed by [facility] Dressing this point. 07/31/18 on evaluation today patient's wound bed actually show signs of improvement with good epithelialization especially in the upper portion of the wound. He has been tolerating the dressing changes without complication in general. Overall I'm extremely happy with how things stand. No fevers, chills, nausea, or vomiting noted at this time. 08/28/18 on evaluation today patient appears to be doing a little better in regard to his lower extremity ulcer. With that being said it appears to be very hyper granular I think this is directly attributed to the fact that he continues to use the [employer] underneath the [facility] Dressing. Although this helps not to stick unfortunately also think he's not getting the benefit of the [facility] Dressing particular and subsequently is causing too much moisture buildup hence the hyper granulation. Fortunately there does not appear to be any signs of infection at this time which is good news. 09/03/18 on evaluation today patient appears to be doing well in regard to his lower [name] ulcer. He has been tolerating the dressing changes without complication. Fortunately he has much less hyper granular tissue the noted last week I do think using silver nitrate in changing to using the [facility] Dressing without the mepitel has made a big difference for him this is good news. 09/18/18 on evaluation today patient appears to be doing excellent in  regard to his right lower [name] ulcer. This is actually significantly smaller even compared to the last evaluation. Overall I'm very pleased in this regard. I'm a recommend that we likely repeat the silver nitrate today based on what I'm seeing. 10/02/18 on evaluation today patient appears to be doing excellent in regard to his lower extremity wound on the right. He's been tolerating the dressing changes without complication. Fortunately there's no signs of infection at this time. Overall been very pleased with how things seem to be progressing. No fevers, chills, nausea, or vomiting noted at this time. 10/16/18 on evaluation today patient actually appears to be doing much better in regard to his lower extremity wound on the right. This is shown signs of good improvement and is indeed measuring smaller he is on a excellent track as far as healing is concerned. My hope is this will be healed of the next several weeks. Fortunately there is no evidence of infection at this time. He does seem to be doing everything that I'm recommending for him 10/30/18 on evaluation today patient appears to be doing better in regard to his lower extremity ulcer. He's been tolerating the dressing changes without complication. Fortunately the Hydrofera Blue Dressing to be doing the job. He has made excellent progress. With that being said this is still going somewhat slow but nonetheless is always making  good progress at this point. 5/18-Patient was in to be seen for right leg ulcer that appeared after scab above it was removed today. This area had healed completely. It appears that no further action is required on this READMISSION 01/02/2022 This is a now 67 year old male with poorly controlled type 2 diabetes mellitus and chronic venous insufficiency who once again was performing lawn care when a rock was flung up by his weedeater and it struck him in the right lateral lower leg. The wound has not healed though it has been  nearly 2 months since the injury occurred. He was seen in the emergency department at [facility] on May 9. At that time it was very painful and he described it as a "15 on a scale of 010". He also was found to have 3+ pitting edema to the bilateral lower extremities. He was prescribed a weeks course of Keflex. He is here today because the wound has failed to heal and he has a prior history in our clinic. ABI in clinic today was noncompressible. He did have formal vascular studies performed in 2019 which were normal. The last hemoglobin A1c I have available for review was from March 21, 2021 and was elevated at 7.7%. The wound is fairly small and circular located about 3 inches above his right lateral malleolus. It is completely covered with eschar. No surrounding erythema, no odor, no purulent drainage. 01/11/2022: The wound on his right lateral leg is a little bit smaller today. It has reaccumulated some slough and a small bit of eschar. It remains fairly painful. He has another area on his anterior tibia that he will not let me touch but I am concerned that there is a wound forming underneath the surface. 01/18/2022: Unfortunately, his wound is a little bit bigger today. He continues to accumulate slough on the wound surface. It is still quite tender. 01/25/2022: The patient bumped his wound on the car door which resulted in bleeding. He was seen at urgent care where it was redressed. Unfortunately, the wound is bigger again today. There is accumulated slough on the surface. Urgent care gave him some tramadol, apparently and he took this prior to his visit so he could tolerate debridement better. 02/02/2022: The wound is a little bit smaller today. The surface, however, has accumulated a fairly thick layer of slough. The periwound skin is intact and there is no obvious sign of infection. 02/07/2022: The wound is a bit larger today. It has thick slough accumulation. The periwound skin is intact and there  is no obvious erythema, induration, nor any odor. 02/16/2022: I took a culture last week due to the appearance of the wound and the patient's degree of pain. This grew out methicillin sensitive Staph aureus. Augmentin was prescribed. The patient states that the pain is markedly improved today. The wound also looks much better. It is smaller with less slough buildup and there is good granulation tissue emerging. 02/21/2022: The patient's pain is quite improved from prior visits. The wound is about the same size and has some accumulated slough on the surface. The base Anthony Barnes, Anthony Barnes ([id number]) [id number] Page 4 of 14 is a little bit fibrotic but there are some areas of granulation tissue filling in. 02/28/2022: The wound is a little bit narrower on its measurements but slightly longer. There is still some slough accumulation on the surface, but he has minimal pain and there is no concern for ongoing infection. Granulation tissue is emerging. 03/07/2022: During the week, he  cut holes in his wrap because it was painful and too tight. As a result, his leg was quite a bit more swollen today and his wound was larger. The wound surface is fairly clean but a little bit dry. Minimal slough accumulation. Granulation tissue present. 03/14/2022: Once again, he did some arts and crafts projects with his wrap. On the other hand, however, his wound does look better. It is smaller today and is beginning to fill in. Minimal slough and a small amount of eschar accumulation. 03/27/2022: His wound measured bigger today, although it does not appear to be so on my impression. The wound continues to fill with granulation tissue. There is a little bit of slough and eschar accumulation. He continues to cut his wrap off of his feet and his edema control at the ankle is particularly poor with 3+ pitting edema. His insurance will not cover any sort of skin substitute unfortunately. 04/04/2022: The wound  is smaller today. It is more superficial with good granulation tissue and just a little bit of slough. He continues to cut away portions of his wrap which results in inadequate edema control. 04/11/2022: His wound measured a couple millimeters larger today. It continues to fill with granulation tissue and there is minimal slough on the surface. 04/19/2022: No significant change in the wound dimensions, but the surface is healthier and less fibrotic. 04/26/2022: The wound is a little bit bigger again today. There is slough accumulation on the surface. Edema control is inadequate. He has been cutting the foot portion of his wrap off each week. 05/03/2022: The wound is a little smaller this week. There is still a fair amount of slough on the surface. His drainage was a little bit as greenish today, although not the blue-green typically associated with Pseudomonas. No significant odor. The underlying granulation tissue appears healthy. The culture that I took last week grew out methicillin sensitive Staph aureus. 05/10/2022: The wound is again a little bit smaller this week. He still has slough accumulation on the surface. Most of the surface has fairly decent granulation tissue, but there is still a fibrotic portion in the center near the caudal aspect of the wound. He did not pick up the Augmentin that I prescribed for his MSSA infection. He says he will get it today. 05/19/2022: His wound is smaller by half a centimeter today. There is a little bit of slough buildup, but less so than at previous visits. He has not been taking his Augmentin properly. He has been splitting the tablets in half and taking them on an irregular schedule. He says this is because it has been upsetting his stomach. 05/25/2022: His wound is a little bit smaller again this week. Very minimal slough accumulation. The wound is drier than I would like to see. He completed his course of Augmentin. 10/19; patient has a wound on the right  lateral lower leg in the setting of obvious chronic venous insufficiency. He has had a previous wound on the anterior leg that we dealt with several years ago. He has been using Hydrofera Blue. Kerlix and Coban 06/08/2022: The wound is smaller again this week. There is some light slough accumulation. It remains a little dry. 06/16/2022: Although the wound dimensions were the same this week, on visual inspection it appears smaller. It is clean, but still dry despite the use of hydrogel. 06/22/2022: His wound responded very well to endoform. It is smaller, moisture balance is excellent, and the surface has lovely granulation tissue. 06/29/2022: His  wound is smaller again this week. The granulation tissue is a little bit hypertrophic. No concern for infection. 07/13/2022: His wound continues to improve. The open area is much smaller, but there is still some hypertrophic granulation tissue present. 07/21/2022: The wound is covered by a thick dark scab. Once this was removed, the majority of the wound was found to be healed, with just a couple of open areas with some slough on them. 12/14The patient comes in today with the area still scabbed over. He would not let me touch these. I explained the problem from a providers point of view about scabs on the wounds including the possibility of deep underlying wounds, superficial wounds that have not healed. There is a course of the possibility that everything is epithelialized under scab. He said he understood this but did not want these further debrided this was initially trauma in the setting of chronic venous insufficiency and lymphedema READMISSION 06/25/2023 This is a now 67 year old man who has had a wound on his left mid tibial area for about 2 months. He thinks this may have happened from hitting his leg on a bed frame although that history is less than certain. He has not been keeping a specific dressing on the wound simply keeping it clean. We had him  in clinic in 2023 and before that on 2020 with traumatic wounds on the right leg. He was discharged with stockings although he has not been wearing these currently. Past medical history includes type 2 diabetes with peripheral neuropathy, chronic venous insufficiency, lymphedema and hypertension. ABI in our clinic was 1.3. Patient had arterial studies in August 2023 at which time on the left his ABI was 1.03 TBI of 0.78 with triphasic waveforms Electronic Signature(s) Signed: 06/25/2023 4:37:17 PM By: Baltazar Najjar MD Entered By: Baltazar Najjar on 06/25/2023 10:27:28 -------------------------------------------------------------------------------- Physical Exam Details Patient Name: Date of Service: Anthony Barnes, [name] 06/25/2023 12:30 PM Anthony Barnes ([id number]) [phone].pdf Page 5 of 14 Medical Record Number: [id number] Patient Account Number: [id number] Date of Birth/Sex: Treating RN: November 28, 1955 (67 y.o. M) Primary Care Provider: Hoy Register Other Clinician: Referring Provider: Treating Provider/Extender: Josephine Cables Weeks in Treatment: 0 Constitutional Patient is hypertensive.. Pulse regular and within target range for patient.Marland Kitchen Respirations regular, non-labored and within target range.. Temperature is normal and within the target range for the patient.Marland Kitchen Appears in no distress. Cardiovascular Pedal pulses are palpable on the left. Edema present in both extremities. Nonpittings.Skin changes of chronic venous insufficiency. Notes Wound exam; area on the left anterior mid tibia circular wound. Under illumination fibrinous surface slough which I removed with a #3 curette. Hemostasis with direct pressure there is no evidence of surrounding infection the granulation underneath this looks quite healthy. Electronic Signature(s) Signed: 06/25/2023 4:37:17 PM By: Baltazar Najjar MD Entered By: Baltazar Najjar on 06/25/2023  10:29:46 -------------------------------------------------------------------------------- Physician Orders Details Patient Name: Date of Service: Anthony Barnes, [name] 06/25/2023 12:30 PM Medical Record Number: [id number] Patient Account Number: [id number] Date of Birth/Sex: Treating RN: Jul 08, 1956 (67 y.o. Cline Cools Primary Care Provider: Hoy Register Other Clinician: Referring Provider: Treating Provider/Extender: Josephine Cables Weeks in Treatment: 0 Verbal / Phone Orders: No Diagnosis Coding Follow-up Appointments ppointment in 1 week. - Dr. Leanord Hawking Return A Return appointment in 3 weeks. - Dr Leanord Hawking Nurse Visit: - 2 wks Anesthetic Wound #3 Left,Anterior Lower Leg (In clinic) Topical Lidocaine 4% applied to wound bed Bathing/ Shower/ Hygiene May shower with protection but do not get  wound dressing(s) wet. Protect dressing(s) with water repellant cover (for example, large plastic bag) or a cast cover and may then take shower. Edema Control - Orders / Instructions Left Lower Extremity Elevate legs to the level of the heart or above for 30 minutes daily and/or when sitting for 3-4 times a day throughout the day. - prop feet up when sitting Avoid standing for long periods of time. Wound Treatment Wound #3 - Lower Leg Wound Laterality: Left, Anterior Cleanser: Soap and Water 1 x Per Day/15 Days Discharge Instructions: May shower and wash wound with dial antibacterial soap and water prior to dressing change. Cleanser: Vashe 5.8 (oz) 1 x Per Day/15 Days Discharge Instructions: Cleanse the wound with Vashe prior to applying a clean dressing using gauze sponges, not tissue or cotton balls. Peri-Wound Care: Triamcinolone 15 (g) 1 x Per Day/15 Days Discharge Instructions: Use triamcinolone 15 (g) as directed Peri-Wound Care: Sween Lotion (Moisturizing lotion) 1 x Per Day/15 Days Discharge Instructions: Apply moisturizing lotion as directed Topical:  Skintegrity Hydrogel 4 (oz) 1 x Per Day/15 Days Discharge Instructions: Apply hydrogel as directed SEVION, SHUTT ([id number]) [phone].pdf Page 6 of 14 Prim Dressing: Promogran Prisma Matrix, 4.34 (sq in) (silver collagen) 1 x Per Day/15 Days ary Discharge Instructions: Moisten collagen with saline or hydrogel Secondary Dressing: ABD Pad, 8x10 1 x Per Day/15 Days Discharge Instructions: Apply over primary dressing as directed. Compression Wrap: Urgo K2, (equivalent to a 4 layer) two layer compression system, regular 1 x Per Day/15 Days Discharge Instructions: Apply Urgo K2 as directed (alternative to 4 layer compression). Patient Medications llergies: Shellfish Containing Products, codeine A Notifications Medication Indication Start End 06/25/2023 lidocaine DOSE topical 5 % ointment - ointment topical once daily Electronic Signature(s) Signed: 06/25/2023 4:37:17 PM By: Baltazar Najjar MD Signed: 06/25/2023 5:15:15 PM By: Redmond Pulling RN, BSN Entered By: Redmond Pulling on 06/25/2023 10:18:37 -------------------------------------------------------------------------------- Problem List Details Patient Name: Date of Service: Anthony Barnes, [name] 06/25/2023 12:30 PM Medical Record Number: [id number] Patient Account Number: [id number] Date of Birth/Sex: Treating RN: June 13, 1956 (67 y.o. M) Primary Care Provider: Hoy Register Other Clinician: Referring Provider: Treating Provider/Extender: Luster Landsberg, Odette Horns Weeks in Treatment: 0 Active Problems ICD-10 Encounter Code Description Active Date MDM Diagnosis L97.828 Non-pressure chronic ulcer of other part of left lower leg with other specified 06/25/2023 No Yes severity S80.812D Abrasion, left lower leg, subsequent encounter 06/25/2023 No Yes E11.622 Type 2 diabetes mellitus with other skin ulcer 06/25/2023 No Yes I87.312 Chronic venous hypertension (idiopathic) with ulcer of left lower  extremity 06/25/2023 No Yes Inactive Problems Resolved Problems Electronic Signature(s) Signed: 06/25/2023 4:37:17 PM By: Baltazar Najjar MD Entered By: Baltazar Najjar on 06/25/2023 10:22:47 Anthony Barnes ([id number]) [id number] Page 7 of 14 -------------------------------------------------------------------------------- Progress Note Details Patient Name: Date of Service: Anthony Barnes [name] 06/25/2023 12:30 PM Medical Record Number: [id number] Patient Account Number: [id number] Date of Birth/Sex: Treating RN: 1955-09-03 (67 y.o. M) Primary Care Provider: Hoy Register Other Clinician: Referring Provider: Treating Provider/Extender: Josephine Cables Weeks in Treatment: 0 Subjective Chief Complaint Information obtained from Patient Right LE Ulcer 06/25/2023; patient returns to clinic with a wound on his left anterior mid tibia History of Present Illness (HPI) ADMISSION 03/12/18 This is a 67 year old and it was a type II diabetic reasonably poorly controlled with a recent hemoglobin A1c of 8.2. He was tending to his lawn on 01/15/18 when his weed Anthony Barnes sent a rock up word hitting him in the right anterior leg.  He was seen in his primary M.D. office is same time. An x-ray showed no abnormality. He was given a course of cephalexin. Since then he's been applying peroxide and topical antibiotics to the wound. He does not have a history of chronic wounds however he does have chronic edema in the lower legs. He is complaining of pain in the right leg wound but no real history of claudication. Past medical history includes type 2 diabetes, hypertension, morbid obesity. ABIs in the right leg were noncompressible 03/19/18 on evaluation today patient appears to be tolerating the wrap in general fairly well. He states he's not having that much pain in regard to the wound itself. With that being said he is having some issues with slough buildup on  the surface of the wound the Iodoflex does seem to be helpful in that regard. He is still having discomfort he wonders if I can send in a prescription for ibuprofen or something of like to help him out. I do believe that the prox end may be of benefit for him. 03/25/18 on evaluation today patient appears to be doing very well in regard to his right lateral lower [name]. He's been tolerating the dressing changes without complication that is the wraps. Nonetheless he does continue to have pain he states is been dreading the possibility of having to have debridement again today. His first debridement experience was not optimal. With that being said he does seem to be showing signs of improvement there does appear to be little bit more granulation he does have a lot of slough that really does need to breeding away. 04/04/18;the patient went for arterial studies that were really quite normal. ABI on the right at 1.19 with triphasic waveforms on the left 1.11 with triphasic waveforms. TBI 0.93 on the right and 0.95 on the left. This suggests T should be able to tolerate even 4 layer compression. He is however complaining of a lot of pain with our wraps I'm wondering whether the pain is simply Iodoflex. I changed him to [employer] I'm still going to try to keep him in 3 layer compression 04/12/18 on evaluation today patient actually appears to be doing rather well in regard to his right lower [name] ulcer. He is making good progress although it is slow still I do believe that the Santyl is much better than the Iodoflex. The good news is the patient seems to be tolerating the dressing changes without complication now that we've gotten good of the Iodoflex which really did burn him quite significantly. Otherwise there's no evidence of infection. 04/17/18 on evaluation today patient actually appears to be showing signs of improvement in regard to the ulcer. Unfortunately he's  had somewhat of a rough day due to the fact that he found out that one of his friends suddenly and unexpectedly passed today. He states he's not sure he's in the frame of mind to allow me to debride the wound at this point. Nonetheless I do feel like he is showing signs of the wound getting better little by little each week. 04/24/18 on evaluation today patient's ulcer actually appears to be showing some signs of improvement albeit slow. He has been tolerating the dressing changes without complication except for the wrap which she states was so tight that he had to remove it it was causing a lot of discomfort. Fortunately there is no evidence of infection at this point. He states he only wore the wrap until about  the time he got home last week and that he had to take it off. Nonetheless he does not have any other compression stockings, Juxta-Lite wraps, or anything otherwise to really help at this point as far as compression is concerned. 05/01/18 on evaluation today patient actually appears to be doing fairly well in regard to his lower extremity ulcer. He has been tolerating the dressing changes currently without complication. The wrap does seem to be helping as far as the fluid management is concerned. Wound bed itself actually show signs of good improvement although there is some Slough noted there's not as much as previous and he actually has fairly decent granulation noted as well. Overall I'm pleased with the progress of to this point. The patient would prefer not to have any debridement today he states he's actually not feeling too well in general and he really does not want any additional discomfort typically debridement is fairly uncomfortable for him. 05/08/18 on evaluation today patient actually appears to be doing a little better in regard to his lower extremity ulcer. He has been tolerating the dressing changes without complication. With that being said I'm actually quite pleased with the  fact the wound is not appear to be infected and in general has made a little progress. With that being said I do believe we need to perform some debridement to clean away the necrotic tissue on the surface of the wound. 05/15/18 on evaluation today patient actually appears to be doing a little better in regard to his wound. Fortunately there is some improvement noted fortunately there's also no significant evidence of infection at this point in time. He does have continued pain that really nothing different than previous he did get his Juxta-Lite wrap. 05/29/18 on evaluation today patient actually appears to be doing somewhat better in regard to his wound currently. He's been tolerating the dressing changes without complication. With that being said he has been performing the [facility] Dressing changes every day at home. I'm pretty sure that we told him every other day last week or when I saw him rather two weeks ago. With that being said obviously did not hurt to do it daily just that obviously is gonna run out of supplies sooner and that could get him into trouble as far as his insurance is concerned. Nonetheless he at this point still continues to have pain although honestly I don't feel like it's as bad as what it was in the past just based on his reactions at this point. 06/12/18 on evaluation today patient actually appears to be doing better in regard to his lower [name] ulcer. He's been tolerating the dressing changes without complication. Fortunately there does not appear to be any evidence of infection at this time. No fevers, chills, nausea, or vomiting noted at this time. 06/19/18 evaluation today patient's ulcer on the lower extremity appears to be doing better at this point. he has been tolerating the dressing changes. The patient Anthony Barnes, Anthony Barnes ([id number]) [id number] Page 8 of 14 seems to be somewhat depressed about the progress of his wound although  the overall appearance at this time seems to be doing well. In general I'm very pleased with the appearance today he does have some biofilm on the surface of the wound as well as of hyper granular tissue we may want to utilize a little bit of silver nitrate today. 06/27/2018; patient comes into clinic today for a wound on the right lateral calf likely related to chronic venous insufficiency.  He has been using medihoney covered with Hydrofera Blue. Wound surface actually looks quite good 07/03/2018 seen today for follow-up and management of lower extremity ulcer right lateral shin. T olerating dressing changes. He has a small amount of biofilm today. Will attempt to remove a portion of the biofilm as he tolerates. He becomes very anxious with wound treatments. He would benefit from layer compression wraps however he refuses compression wraps or anything that smokes his legs. He expressed an inability to tolerate compression wraps. Juxta lite wraps have been ordered. He has been instructed on the appropriate application of the juxta leg wraps. His blood pressure today on visit was 200/120. He denies any blurred vision, chest pain, dizziness, shortness of breath, or difficulty with mobility. Mr. Peden stated that he had a recent visit with his primary care provider. At that time his carvedilol was decreased from 25 mg daily to 12.5 mg daily. Strongly encouraged Mr. Winterberg to seek medical attention today during visit. He declined transport for evaluation of elevated blood pressure. Encouraged him to contact his primary care provider today for medication management. He stated that he would call his primary care doctor after wound visit today. Also encouraged him that if he experienced any symptoms of blurred vision, chest pain, shortness of breath to immediately seek medical attention. 07/17/18 on evaluation today patient's wound actually does seem to show signs of improvement based on the overall  appearance of the wound bed today. There does not appear to show any signs of infection which is good news. There is no overall worsening which is also good news. He still has hyper granular tissue will use in the [employer] followed by [facility] Dressing this point. 07/31/18 on evaluation today patient's wound bed actually show signs of improvement with good epithelialization especially in the upper portion of the wound. He has been tolerating the dressing changes without complication in general. Overall I'm extremely happy with how things stand. No fevers, chills, nausea, or vomiting noted at this time. 08/28/18 on evaluation today patient appears to be doing a little better in regard to his lower extremity ulcer. With that being said it appears to be very hyper granular I think this is directly attributed to the fact that he continues to use the [employer] underneath the [facility] Dressing. Although this helps not to stick unfortunately also think he's not getting the benefit of the [facility] Dressing particular and subsequently is causing too much moisture buildup hence the hyper granulation. Fortunately there does not appear to be any signs of infection at this time which is good news. 09/03/18 on evaluation today patient appears to be doing well in regard to his lower [name] ulcer. He has been tolerating the dressing changes without complication. Fortunately he has much less hyper granular tissue the noted last week I do think using silver nitrate in changing to using the [facility] Dressing without the mepitel has made a big difference for him this is good news. 09/18/18 on evaluation today patient appears to be doing excellent in regard to his right lower [name] ulcer. This is actually significantly smaller even compared to the last evaluation. Overall I'm very pleased in this regard. I'm a recommend that we likely repeat the silver nitrate today based on what I'm seeing. 10/02/18 on  evaluation today patient appears to be doing excellent in regard to his lower extremity wound on the right. He's been tolerating the dressing changes without complication. Fortunately there's no signs of infection at this  time. Overall been very pleased with how things seem to be progressing. No fevers, chills, nausea, or vomiting noted at this time. 10/16/18 on evaluation today patient actually appears to be doing much better in regard to his lower extremity wound on the right. This is shown signs of good improvement and is indeed measuring smaller he is on a excellent track as far as healing is concerned. My hope is this will be healed of the next several weeks. Fortunately there is no evidence of infection at this time. He does seem to be doing everything that I'm recommending for him 10/30/18 on evaluation today patient appears to be doing better in regard to his lower extremity ulcer. He's been tolerating the dressing changes without complication. Fortunately the Hydrofera Blue Dressing to be doing the job. He has made excellent progress. With that being said this is still going somewhat slow but nonetheless is always making good progress at this point. 5/18-Patient was in to be seen for right leg ulcer that appeared after scab above it was removed today. This area had healed completely. It appears that no further action is required on this READMISSION 01/02/2022 This is a now 67 year old male with poorly controlled type 2 diabetes mellitus and chronic venous insufficiency who once again was performing lawn care when a rock was flung up by his weedeater and it struck him in the right lateral lower leg. The wound has not healed though it has been nearly 2 months since the injury occurred. He was seen in the emergency department at [facility] on May 9. At that time it was very painful and he described it as a "15 on a scale of 010". He also was found to have 3+ pitting edema to the bilateral lower  extremities. He was prescribed a weeks course of Keflex. He is here today because the wound has failed to heal and he has a prior history in our clinic. ABI in clinic today was noncompressible. He did have formal vascular studies performed in 2019 which were normal. The last hemoglobin A1c I have available for review was from March 21, 2021 and was elevated at 7.7%. The wound is fairly small and circular located about 3 inches above his right lateral malleolus. It is completely covered with eschar. No surrounding erythema, no odor, no purulent drainage. 01/11/2022: The wound on his right lateral leg is a little bit smaller today. It has reaccumulated some slough and a small bit of eschar. It remains fairly painful. He has another area on his anterior tibia that he will not let me touch but I am concerned that there is a wound forming underneath the surface. 01/18/2022: Unfortunately, his wound is a little bit bigger today. He continues to accumulate slough on the wound surface. It is still quite tender. 01/25/2022: The patient bumped his wound on the car door which resulted in bleeding. He was seen at urgent care where it was redressed. Unfortunately, the wound is bigger again today. There is accumulated slough on the surface. Urgent care gave him some tramadol, apparently and he took this prior to his visit so he could tolerate debridement better. 02/02/2022: The wound is a little bit smaller today. The surface, however, has accumulated a fairly thick layer of slough. The periwound skin is intact and there is no obvious sign of infection. 02/07/2022: The wound is a bit larger today. It has thick slough accumulation. The periwound skin is intact and there is no obvious erythema, induration, nor any odor.  02/16/2022: I took a culture last week due to the appearance of the wound and the patient's degree of pain. This grew out methicillin sensitive Staph aureus. Augmentin was prescribed. The patient states  that the pain is markedly improved today. The wound also looks much better. It is smaller with less slough buildup and there is good granulation tissue emerging. 02/21/2022: The patient's pain is quite improved from prior visits. The wound is about the same size and has some accumulated slough on the surface. The base is a little bit fibrotic but there are some areas of granulation tissue filling in. 02/28/2022: The wound is a little bit narrower on its measurements but slightly longer. There is still some slough accumulation on the surface, but he has minimal pain and there is no concern for ongoing infection. Granulation tissue is emerging. 03/07/2022: During the week, he cut holes in his wrap because it was painful and too tight. As a result, his leg was quite a bit more swollen today and his wound was larger. The wound surface is fairly clean but a little bit dry. Minimal slough accumulation. Granulation tissue present. Anthony Barnes, Anthony Barnes ([id number]) [id number] Page 9 of 14 03/14/2022: Once again, he did some arts and crafts projects with his wrap. On the other hand, however, his wound does look better. It is smaller today and is beginning to fill in. Minimal slough and a small amount of eschar accumulation. 03/27/2022: His wound measured bigger today, although it does not appear to be so on my impression. The wound continues to fill with granulation tissue. There is a little bit of slough and eschar accumulation. He continues to cut his wrap off of his feet and his edema control at the ankle is particularly poor with 3+ pitting edema. His insurance will not cover any sort of skin substitute unfortunately. 04/04/2022: The wound is smaller today. It is more superficial with good granulation tissue and just a little bit of slough. He continues to cut away portions of his wrap which results in inadequate edema control. 04/11/2022: His wound measured a couple millimeters larger  today. It continues to fill with granulation tissue and there is minimal slough on the surface. 04/19/2022: No significant change in the wound dimensions, but the surface is healthier and less fibrotic. 04/26/2022: The wound is a little bit bigger again today. There is slough accumulation on the surface. Edema control is inadequate. He has been cutting the foot portion of his wrap off each week. 05/03/2022: The wound is a little smaller this week. There is still a fair amount of slough on the surface. His drainage was a little bit as greenish today, although not the blue-green typically associated with Pseudomonas. No significant odor. The underlying granulation tissue appears healthy. The culture that I took last week grew out methicillin sensitive Staph aureus. 05/10/2022: The wound is again a little bit smaller this week. He still has slough accumulation on the surface. Most of the surface has fairly decent granulation tissue, but there is still a fibrotic portion in the center near the caudal aspect of the wound. He did not pick up the Augmentin that I prescribed for his MSSA infection. He says he will get it today. 05/19/2022: His wound is smaller by half a centimeter today. There is a little bit of slough buildup, but less so than at previous visits. He has not been taking his Augmentin properly. He has been splitting the tablets in half and taking them on an irregular schedule. He  says this is because it has been upsetting his stomach. 05/25/2022: His wound is a little bit smaller again this week. Very minimal slough accumulation. The wound is drier than I would like to see. He completed his course of Augmentin. 10/19; patient has a wound on the right lateral lower leg in the setting of obvious chronic venous insufficiency. He has had a previous wound on the anterior leg that we dealt with several years ago. He has been using Hydrofera Blue. Kerlix and Coban 06/08/2022: The wound is smaller again  this week. There is some light slough accumulation. It remains a little dry. 06/16/2022: Although the wound dimensions were the same this week, on visual inspection it appears smaller. It is clean, but still dry despite the use of hydrogel. 06/22/2022: His wound responded very well to endoform. It is smaller, moisture balance is excellent, and the surface has lovely granulation tissue. 06/29/2022: His wound is smaller again this week. The granulation tissue is a little bit hypertrophic. No concern for infection. 07/13/2022: His wound continues to improve. The open area is much smaller, but there is still some hypertrophic granulation tissue present. 07/21/2022: The wound is covered by a thick dark scab. Once this was removed, the majority of the wound was found to be healed, with just a couple of open areas with some slough on them. 12/14The patient comes in today with the area still scabbed over. He would not let me touch these. I explained the problem from a providers point of view about scabs on the wounds including the possibility of deep underlying wounds, superficial wounds that have not healed. There is a course of the possibility that everything is epithelialized under scab. He said he understood this but did not want these further debrided this was initially trauma in the setting of chronic venous insufficiency and lymphedema READMISSION 06/25/2023 This is a now 67 year old man who has had a wound on his left mid tibial area for about 2 months. He thinks this may have happened from hitting his leg on a bed frame although that history is less than certain. He has not been keeping a specific dressing on the wound simply keeping it clean. We had him in clinic in 2023 and before that on 2020 with traumatic wounds on the right leg. He was discharged with stockings although he has not been wearing these currently. Past medical history includes type 2 diabetes with peripheral neuropathy, chronic  venous insufficiency, lymphedema and hypertension. ABI in our clinic was 1.3. Patient had arterial studies in August 2023 at which time on the left his ABI was 1.03 TBI of 0.78 with triphasic waveforms Patient History Information obtained from Patient. Allergies Shellfish Containing Products, codeine (Reaction: itching) Family History Cancer - Mother, Diabetes - Mother,Father, Hypertension - Mother,Father, Kidney Disease - Siblings, Stroke - Father, No family history of Heart Disease, Hereditary Spherocytosis, Lung Disease, Seizures, Thyroid Problems, Tuberculosis. Social History Former smoker - ended on 08/14/1990, Marital Status - Married, Alcohol Use - Moderate, Drug Use - No History, Caffeine Use - Daily. Medical History Eyes Denies history of Cataracts, Glaucoma, Optic Neuritis Ear/Nose/Mouth/Throat Denies history of Chronic sinus problems/congestion, Middle ear problems Hematologic/Lymphatic Denies history of Anemia, Hemophilia, Human Immunodeficiency Virus, Lymphedema, Sickle Cell Disease Respiratory Denies history of Aspiration, Asthma, Chronic Obstructive Pulmonary Disease (COPD), Pneumothorax, Sleep Apnea, Tuberculosis Cardiovascular Patient has history of Hypertension Denies history of Angina, Arrhythmia, Congestive Heart Failure, Coronary Artery Disease, Deep Vein Thrombosis, Hypotension, Myocardial Infarction, Anthony Barnes, Anthony Barnes ([id number]) [phone].pdf Page  10 of 14 Peripheral Arterial Disease, Peripheral Venous Disease, Phlebitis, Vasculitis Gastrointestinal Denies history of Cirrhosis , Colitis, Crohns, Hepatitis A, Hepatitis B, Hepatitis C Endocrine Patient has history of Type II Diabetes Genitourinary Denies history of End Stage Renal Disease Immunological Denies history of Lupus Erythematosus, Raynauds, Scleroderma Integumentary (Skin) Denies history of History of Burn Musculoskeletal Denies history of Gout, Rheumatoid Arthritis,  Osteoarthritis, Osteomyelitis Neurologic Denies history of Dementia, Neuropathy, Quadriplegia, Paraplegia, Seizure Disorder Oncologic Denies history of Received Chemotherapy, Received Radiation Psychiatric Denies history of Anorexia/bulimia, Confinement Anxiety Hospitalization/Surgery History - esophagogastroduodenoscopy. - brain aneurysm surgery. Medical A Surgical History Notes nd Constitutional Symptoms (General Health) obesity Gastrointestinal diverticulitis Neurologic neuropathy Objective Constitutional Patient is hypertensive.. Pulse regular and within target range for patient.Marland Kitchen Respirations regular, non-labored and within target range.. Temperature is normal and within the target range for the patient.Marland Kitchen Appears in no distress. Vitals Time Taken: 1:00 PM, Temperature: 98.3 F, Pulse: 64 bpm, Respiratory Rate: 18 breaths/min, Blood Pressure: 142/95 mmHg. Cardiovascular Pedal pulses are palpable on the left. General Notes: Wound exam; area on the left anterior mid tibia circular wound. Under illumination fibrinous surface slough which I removed with a #3 curette. Hemostasis with direct pressure there is no evidence of surrounding infection the granulation underneath this looks quite healthy. Integumentary (Hair, Skin) Wound #3 status is Open. Original cause of wound was Trauma. The date acquired was: 04/17/2023. The wound is located on the Left,Anterior Lower Leg. The wound measures 1.2cm length x 1cm width x 0.1cm depth; 0.942cm^2 area and 0.094cm^3 volume. There is Fat Layer (Subcutaneous Tissue) exposed. There is no tunneling or undermining noted. There is a medium amount of serosanguineous drainage noted. There is large (67-100%) red granulation within the wound bed. There is a small (1-33%) amount of necrotic tissue within the wound bed including Adherent Slough. Assessment Active Problems ICD-10 Non-pressure chronic ulcer of other part of left lower leg with other specified  severity Abrasion, left lower leg, subsequent encounter Type 2 diabetes mellitus with other skin ulcer Chronic venous hypertension (idiopathic) with ulcer of left lower extremity Procedures Wound #3 Pre-procedure diagnosis of Wound #3 is an Abrasion located on the Left,Anterior Lower Leg .Severity of Tissue Pre Debridement is: Fat layer exposed. There was a Selective/Open Wound Non-Viable Tissue Debridement with a total area of 0.94 sq cm performed by Maxwell Caul., MD. With the following instrument(s): Curette to remove Non-Viable tissue/material. Material removed includes [facility] after achieving pain control using Lidocaine 4% Topical Solution. No specimens were taken. A time out was conducted at 13:10, prior to the start of the procedure. A Minimum amount of bleeding was controlled with Pressure. The procedure was tolerated well. Post Debridement Measurements: 1.2cm length x 1cm width x 0.1cm depth; 0.094cm^3 volume. Anthony Barnes, Anthony Barnes ([id number]) [id number] Page 11 of 14 Character of Wound/Ulcer Post Debridement is improved. Severity of Tissue Post Debridement is: Fat layer exposed. Post procedure Diagnosis Wound #3: Same as Pre-Procedure Plan Follow-up Appointments: Return Appointment in 1 week. - Dr. Leanord Hawking Return appointment in 3 weeks. - Dr Leanord Hawking Nurse Visit: - 2 wks Anesthetic: Wound #3 Left,Anterior Lower Leg: (In clinic) Topical Lidocaine 4% applied to wound bed Bathing/ Shower/ Hygiene: May shower with protection but do not get wound dressing(s) wet. Protect dressing(s) with water repellant cover (for example, large plastic bag) or a cast cover and may then take shower. Edema Control - Orders / Instructions: Elevate legs to the level of the heart or above for 30 minutes daily and/or when sitting  for 3-4 times a day throughout the day. - prop feet up when sitting Avoid standing for long periods of time. The following medication(s) was  prescribed: lidocaine topical 5 % ointment ointment topical once daily was prescribed at facility WOUND #3: - Lower Leg Wound Laterality: Left, Anterior Cleanser: Soap and Water 1 x Per Day/15 Days Discharge Instructions: May shower and wash wound with dial antibacterial soap and water prior to dressing change. Cleanser: Vashe 5.8 (oz) 1 x Per Day/15 Days Discharge Instructions: Cleanse the wound with Vashe prior to applying a clean dressing using gauze sponges, not tissue or cotton balls. Peri-Wound Care: Triamcinolone 15 (g) 1 x Per Day/15 Days Discharge Instructions: Use triamcinolone 15 (g) as directed Peri-Wound Care: Sween Lotion (Moisturizing lotion) 1 x Per Day/15 Days Discharge Instructions: Apply moisturizing lotion as directed Topical: Skintegrity Hydrogel 4 (oz) 1 x Per Day/15 Days Discharge Instructions: Apply hydrogel as directed Prim Dressing: Promogran Prisma Matrix, 4.34 (sq in) (silver collagen) 1 x Per Day/15 Days ary Discharge Instructions: Moisten collagen with saline or hydrogel Secondary Dressing: ABD Pad, 8x10 1 x Per Day/15 Days Discharge Instructions: Apply over primary dressing as directed. Com pression Wrap: Urgo K2, (equivalent to a 4 layer) two layer compression system, regular 1 x Per Day/15 Days Discharge Instructions: Apply Urgo K2 as directed (alternative to 4 layer compression). 1. We are going to use silver collagen, hydrogel with TCA around the periwound. ABDs under 4-layer compression. 2. He has some edema in the left leg probably mostly lymphedema and some skin changes of chronic venous insufficiency. 3. We will see him back in a week. Advised to keep the wrap dry. He understands this from the last thing in this clinic Electronic Signature(s) Signed: 06/25/2023 4:37:17 PM By: Baltazar Najjar MD Entered By: Baltazar Najjar on 06/25/2023 10:28:58 -------------------------------------------------------------------------------- HxROS Details Patient Name:  Date of Service: Anthony Barnes, [name] 06/25/2023 12:30 PM Medical Record Number: [id number] Patient Account Number: [id number] Date of Birth/Sex: Treating RN: 03-21-1956 (67 y.o. Tammy Sours Primary Care Provider: Hoy Register Other Clinician: Referring Provider: Treating Provider/Extender: Josephine Cables Weeks in Treatment: 0 Information Obtained From Patient Constitutional Symptoms (General Health) Medical History: Past Medical History Notes: obesity Eyes Medical HistoryMarland Kitchen Anthony Barnes, Anthony Barnes ([id number]) [id number] Page 12 of 14 Negative for: Cataracts; Glaucoma; Optic Neuritis Ear/Nose/Mouth/Throat Medical History: Negative for: Chronic sinus problems/congestion; Middle ear problems Hematologic/Lymphatic Medical History: Negative for: Anemia; Hemophilia; Human Immunodeficiency Virus; Lymphedema; Sickle Cell Disease Respiratory Medical History: Negative for: Aspiration; Asthma; Chronic Obstructive Pulmonary Disease (COPD); Pneumothorax; Sleep Apnea; Tuberculosis Cardiovascular Medical History: Positive for: Hypertension Negative for: Angina; Arrhythmia; Congestive Heart Failure; Coronary Artery Disease; Deep Vein Thrombosis; Hypotension; Myocardial Infarction; Peripheral Arterial Disease; Peripheral Venous Disease; Phlebitis; Vasculitis Gastrointestinal Medical History: Negative for: Cirrhosis ; Colitis; Crohns; Hepatitis A; Hepatitis B; Hepatitis C Past Medical History Notes: diverticulitis Endocrine Medical History: Positive for: Type II Diabetes Time with diabetes: 9 years Treated with: Oral agents Blood sugar tested every day: No Genitourinary Medical History: Negative for: End Stage Renal Disease Immunological Medical History: Negative for: Lupus Erythematosus; Raynauds; Scleroderma Integumentary (Skin) Medical History: Negative for: History of Burn Musculoskeletal Medical History: Negative for: Gout;  Rheumatoid Arthritis; Osteoarthritis; Osteomyelitis Neurologic Medical History: Negative for: Dementia; Neuropathy; Quadriplegia; Paraplegia; Seizure Disorder Past Medical History Notes: neuropathy Oncologic Medical History: Negative for: Received Chemotherapy; Received Radiation Psychiatric Medical History: Negative for: Anorexia/bulimia; Confinement Anxiety Immunizations Anthony Barnes, Anthony Barnes ([id number]) [phone].pdf Page 13 of 14 Pneumococcal Vaccine: Received Pneumococcal Vaccination: No  Immunization Notes: tetanus 2 years ago Implantable Devices None Hospitalization / Surgery History Type of Hospitalization/Surgery esophagogastroduodenoscopy brain aneurysm surgery Family and Social History Cancer: Yes - Mother; Diabetes: Yes - Mother,Father; Heart Disease: No; Hereditary Spherocytosis: No; Hypertension: Yes - Mother,Father; Kidney Disease: Yes - Siblings; Lung Disease: No; Seizures: No; Stroke: Yes - Father; Thyroid Problems: No; Tuberculosis: No; Former smoker - ended on 08/14/1990; Marital Status - Married; Alcohol Use: Moderate; Drug Use: No History; Caffeine Use: Daily; Financial Concerns: No; Food, Clothing or Shelter Needs: No; Support System Lacking: No; Transportation Concerns: No Psychologist, prison and probation services) Signed: 06/25/2023 4:28:03 PM By: Shawn Stall RN, BSN Signed: 06/25/2023 4:37:17 PM By: Baltazar Najjar MD Signed: 06/25/2023 5:15:15 PM By: Redmond Pulling RN, BSN Entered By: Redmond Pulling on 06/25/2023 09:43:22 -------------------------------------------------------------------------------- SuperBill Details Patient Name: Date of Service: Anthony Barnes, [name] 06/25/2023 Medical Record Number: [id number] Patient Account Number: [id number] Date of Birth/Sex: Treating RN: 02-05-1956 (67 y.o. M) Primary Care Provider: Hoy Register Other Clinician: Referring Provider: Treating Provider/Extender: Luster Landsberg, Odette Horns Weeks in  Treatment: 0 Diagnosis Coding ICD-10 Codes Code Description [id number] Non-pressure chronic ulcer of other part of left lower leg with other specified severity S80.812D Abrasion, left lower leg, subsequent encounter E11.622 Type 2 diabetes mellitus with other skin ulcer I87.312 Chronic venous hypertension (idiopathic) with ulcer of left lower extremity Facility Procedures : CPT4 Code: 96295284 Description: 99213 - WOUND CARE VISIT-LEV 3 EST PT Modifier: Quantity: 1 : CPT4 Code: 13244010 Description: 97597 - DEBRIDE WOUND 1ST 20 SQ CM OR < ICD-10 Diagnosis Description L97.828 Non-pressure chronic ulcer of other part of left lower leg with other specified se Modifier: verity Quantity: 1 Physician Procedures : CPT4 Code Description Modifier [id number] 99214 - WC PHYS LEVEL 4 - EST PT 25 ICD-10 Diagnosis Description L97.828 Non-pressure chronic ulcer of other part of left lower leg with other specified severity S80.812D Abrasion, left lower leg, subsequent  encounter E11.622 Type 2 diabetes mellitus with other skin ulcer I87.312 Chronic venous hypertension (idiopathic) with ulcer of left lower extremity Quantity: 1 : [id number] 97597 - WC PHYS DEBR WO ANESTH 20 SQ CM ICD-10 Diagnosis Description L97.828 Non-pressure chronic ulcer of other part of left lower leg with other specified severity Anthony Barnes, Anthony Barnes ([id number]) [phone].pd Quantity: 1 f Page 14 of 14 Electronic Signature(s) Signed: 06/25/2023 4:37:17 PM By: Baltazar Najjar MD Signed: 06/25/2023 5:15:15 PM By: Redmond Pulling RN, BSN Entered By: Redmond Pulling on 06/25/2023 11:41:24

## 2023-06-25 NOTE — Progress Notes (Signed)
INNIS, CAMIRE ([id number]) [id number] Page 1 of 9 Visit Report for 06/25/2023 Allergy List Details Patient Name: Date of Service: Anthony Anthony [name] 06/25/2023 12:30 PM Medical Record Number: [id number] Patient Account Number: [id number] Date of Birth/Sex: Treating RN: Dec 18, 1955 (67 y.o. Tammy Sours Primary Care Jeven Topper: Hoy Register Other Clinician: Referring Deneise Getty: Treating Jennalynn Rivard/Extender: Luster Landsberg, Odette Horns Weeks in Treatment: 0 Allergies Active Allergies Shellfish Containing Products codeine Reaction: itching Allergy Notes Electronic Signature(s) Signed: 06/25/2023 4:28:03 PM By: Shawn Stall RN, BSN Entered By: Shawn Stall on 06/22/2023 11:09:52 -------------------------------------------------------------------------------- Arrival Information Details Patient Name: Date of Service: Anthony Barnes, Anthony [name] 06/25/2023 12:30 PM Medical Record Number: [id number] Patient Account Number: [id number] Date of Birth/Sex: Treating RN: Jul 25, 1956 (67 y.o. Anthony Barnes Primary Care Zanetta Dehaan: Hoy Register Other Clinician: Referring Kaziyah Parkison: Treating Dong Nimmons/Extender: Josephine Cables Weeks in Treatment: 0 Visit Information Patient Arrived: Ambulatory Arrival Time: 12:38 Accompanied By: self Transfer Assistance: None Patient Identification Verified: Yes Secondary Verification Process Completed: Yes History Since Last Visit Added or deleted any medications: Yes Any new allergies or adverse reactions: No Had a fall or experienced change in activities of daily living that may affect risk of falls: No Signs or symptoms of abuse/neglect since last visito No Hospitalized since last visit: No Implantable device outside of the clinic excluding cellular tissue based products placed in the center since last visit: No Pain Present Now: No Electronic Signature(s) Signed: 06/25/2023 5:15:15 PM By: Redmond Pulling RN, BSN Entered By: Redmond Pulling on 06/25/2023 12:38:57 Anthony Barnes ([id number]) [id number] Page 2 of 9 -------------------------------------------------------------------------------- Clinic Level of Care Assessment Details Patient Name: Date of Service: Anthony Anthony [name] 06/25/2023 12:30 PM Medical Record Number: [id number] Patient Account Number: [id number] Date of Birth/Sex: Treating RN: 06/12/1956 (67 y.o. Anthony Barnes Primary Care Camaryn Lumbert: Hoy Register Other Clinician: Referring Evie Croston: Treating Emira Eubanks/Extender: Josephine Cables Weeks in Treatment: 0 Clinic Level of Care Assessment Items TOOL 1 Quantity Score X- 1 0 Use when EandM and Procedure is performed on INITIAL visit ASSESSMENTS - Nursing Assessment / Reassessment X- 1 20 General Physical Exam (combine w/ comprehensive assessment (listed just below) when performed on new pt. evals) X- 1 25 Comprehensive Assessment (HX, ROS, Risk Assessments, Wounds Hx, etc.) ASSESSMENTS - Wound and Skin Assessment / Reassessment []  - 0 Dermatologic / Skin Assessment (not related to wound area) ASSESSMENTS - Ostomy and/or Continence Assessment and Care []  - 0 Incontinence Assessment and Management []  - 0 Ostomy Care Assessment and Management (repouching, etc.) PROCESS - Coordination of Care X - Simple Patient / Family Education for ongoing care 1 15 []  - 0 Complex (extensive) Patient / Family Education for ongoing care X- 1 10 Staff obtains Chiropractor, Records, T Results / Process Orders est []  - 0 Staff telephones HHA, Nursing Homes / Clarify orders / etc []  - 0 Routine Transfer to another Facility (non-emergent condition) []  - 0 Routine Hospital Admission (non-emergent condition) X- 1 15 New Admissions / Manufacturing engineer / Ordering NPWT Apligraf, etc. , []  - 0 Emergency Hospital Admission (emergent condition) PROCESS - Special Needs []  -  0 Pediatric / Minor Patient Management []  - 0 Isolation Patient Management []  - 0 Hearing / Language / Visual special needs []  - 0 Assessment of Community assistance (transportation, D/C planning, etc.) []  - 0 Additional assistance / Altered mentation []  - 0 Support Surface(s) Assessment (bed, cushion, seat, etc.) INTERVENTIONS - Miscellaneous []  - 0 External ear exam []  -  0 Patient Transfer (multiple staff / Nurse, adult / Similar devices) []  - 0 Simple Staple / Suture removal (25 or less) []  - 0 Complex Staple / Suture removal (26 or more) []  - 0 Hypo/Hyperglycemic Management (do not check if billed separately) X- 1 15 Ankle / Brachial Index (ABI) - do not check if billed separately Has the patient been seen at the hospital within the last three years: Yes Total Score: 100 Level Of Care: New/Established - Level 3 Electronic Signature(s) Signed: 06/25/2023 5:15:15 PM By: Redmond Pulling RN, BSN Anthony Barnes, Anthony Barnes ([id number]) By: Redmond Pulling RN, BSN [phone].pdf Page 3 of 9 Signed: 06/25/2023 5:15:15 PM Entered By: Redmond Pulling on 06/25/2023 14:41:15 -------------------------------------------------------------------------------- Compression Therapy Details Patient Name: Date of Service: Anthony Anthony [name] 06/25/2023 12:30 PM Medical Record Number: [id number] Patient Account Number: [id number] Date of Birth/Sex: Treating RN: [birth year] (67 y.o. Anthony Barnes Primary Care Lexani Corona: Hoy Register Other Clinician: Referring Juliett Eastburn: Treating Oziah Vitanza/Extender: Josephine Cables Weeks in Treatment: 0 Compression Therapy Performed for Wound Assessment: Wound #3 Left,Anterior Lower Leg Performed By: Clinician Redmond Pulling, RN Compression Type: Four Layer Post Procedure Diagnosis Same as Pre-procedure Electronic Signature(s) Signed: 06/25/2023 5:15:15 PM By: Redmond Pulling RN, BSN Entered By: Redmond Pulling on 06/25/2023  14:40:35 -------------------------------------------------------------------------------- Encounter Discharge Information Details Patient Name: Date of Service: Anthony Barnes, Anthony [name] 06/25/2023 12:30 PM Medical Record Number: [id number] Patient Account Number: [id number] Date of Birth/Sex: Treating RN: 15-May-1956 (67 y.o. Anthony Barnes Primary Care Amena Dockham: Hoy Register Other Clinician: Referring Airabella Barley: Treating Semira Stoltzfus/Extender: Josephine Cables Weeks in Treatment: 0 Encounter Discharge Information Items Post Procedure Vitals Discharge Condition: Stable Temperature (F): 98.3 Ambulatory Status: Ambulatory Pulse (bpm): 64 Discharge Destination: Home Respiratory Rate (breaths/min): 18 Transportation: Private Auto Blood Pressure (mmHg): 142/95 Accompanied By: self Schedule Follow-up Appointment: Yes Clinical Summary of Care: Patient Declined Electronic Signature(s) Signed: 06/25/2023 5:15:15 PM By: Redmond Pulling RN, BSN Entered By: Redmond Pulling on 06/25/2023 14:42:28 -------------------------------------------------------------------------------- Lower Extremity Assessment Details Patient Name: Date of Service: Anthony Barnes, Anthony [name] 06/25/2023 12:30 PM Medical Record Number: [id number] Patient Account Number: [id number] Date of Birth/Sex: Treating RN: 29-Apr-1956 (67 y.o. Anthony Barnes Primary Care Darl Kuss: Hoy Register Other Clinician: RHAMEL, Anthony Barnes ([id number]) [id number] Page 4 of 9 Referring Laikynn Pollio: Treating Reighlyn Elmes/Extender: Josephine Cables Weeks in Treatment: 0 Edema Assessment Assessed: [Left: Yes] [Right: No] [Left: Edema] [Right: :] Calf Left: Right: Point of Measurement: From Medial Instep 44.8 cm Ankle Left: Right: Point of Measurement: From Medial Instep 26 cm Vascular Assessment Pulses: Dorsalis Pedis Palpable: [Left:Yes] Extremity colors, hair growth, and  conditions: Extremity Color: [Left:Normal] Hair Growth on Extremity: [Left:No] Temperature of Extremity: [Left:Warm] Capillary Refill: [Left:> 3 seconds] Blood Pressure: Brachial: [Left:142] Ankle: [Left:Dorsalis Pedis: 184 1.30] Toe Nail Assessment Left: Right: Thick: Yes Discolored: Yes Deformed: No Improper Length and Hygiene: No Electronic Signature(s) Signed: 06/25/2023 5:15:15 PM By: Redmond Pulling RN, BSN Entered By: Redmond Pulling on 06/25/2023 13:00:23 -------------------------------------------------------------------------------- Multi Wound Chart Details Patient Name: Date of Service: Anthony Barnes, Anthony [name] 06/25/2023 12:30 PM Medical Record Number: [id number] Patient Account Number: [id number] Date of Birth/Sex: Treating RN: 01-13-1956 (67 y.o. 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 Secondary Etiology: Extremity Hypertension, Type II Diabetes N/A N/A Comorbid History: 04/17/2023 N/A N/A Date Acquired: 0 N/A N/A Weeks of Treatment: Open N/A N/A Wound Status: No N/A N/A Wound Recurrence: 1.2x1x0.1 N/A N/A Measurements L x W x D (cm) 0.942 N/A N/A A (cm) : rea 0.094 N/A N/A Volume (cm) : Full Thickness Without Exposed N/A N/A Classification: Support Structures Medium N/A N/A Exudate A mount: Serosanguineous N/A N/A Exudate Type: red, brown N/A N/A Exudate Color: Large (67-100%) N/A N/A Granulation A mount: Red N/A N/A Granulation Quality: Small (1-33%) N/A N/A Necrotic A mount: Fat  Layer (Subcutaneous Tissue): Yes N/A N/A Exposed Structures: Fascia: No Tendon: No Muscle: No Joint: No Bone: No None N/A N/A Epithelialization: Debridement - Selective/Open Wound N/A N/A Debridement: Pre-procedure Verification/Time Out 13:10 N/A N/A Taken: Lidocaine 4% Topical Solution N/A N/A Pain Control: Slough N/A N/A Tissue Debrided: Non-Viable Tissue N/A N/A Level: 0.94 N/A N/A Debridement A (sq cm): rea Curette N/A N/A Instrument: Minimum N/A N/A Bleeding: Pressure N/A N/A Hemostasis A chieved: Procedure was tolerated well N/A N/A Debridement Treatment Response: 1.2x1x0.1 N/A N/A Post Debridement Measurements L x W x D (cm) 0.094 N/A N/A Post Debridement Volume: (cm) Debridement N/A N/A Procedures Performed: Treatment Notes Electronic Signature(s) Signed: 06/25/2023 4:37:17 PM By: Baltazar Najjar MD Entered By: Baltazar Najjar on 06/25/2023 13:22:55 -------------------------------------------------------------------------------- Multi-Disciplinary Care Plan Details Patient Name: Date of Service: Anthony Barnes, Anthony [name] 06/25/2023 12:30 PM Medical Record Number: [id number] Patient Account Number: [id number] Date of Birth/Sex: Treating RN: 1955/08/17 (67 y.o. Anthony Barnes Primary Care Pharoah Goggins: Hoy Register Other Clinician: Referring Albin Duckett: Treating Joya Willmott/Extender: Josephine Cables Weeks in Treatment: 0 Active Inactive Wound/Skin Impairment Nursing Diagnoses: Anthony Barnes, Anthony Barnes ([id number]) [phone].pdf Page 6 of 9 Impaired tissue integrity Knowledge deficit related to ulceration/compromised skin integrity Goals: Patient/caregiver will verbalize understanding of skin care regimen Date Initiated: 06/25/2023 Target Resolution Date: 08/09/2023 Goal Status: Active Ulcer/skin breakdown will have a volume reduction of 30% by week 4 Date Initiated: 06/25/2023 Target Resolution Date: 07/16/2023 Goal  Status: Active Interventions: Assess patient/caregiver ability to obtain necessary supplies Assess patient/caregiver ability to perform ulcer/skin care regimen upon admission and as needed Assess ulceration(s) every visit Provide education on ulcer and skin care Notes: Electronic Signature(s) Signed: 06/25/2023 5:15:15 PM By: Redmond Pulling RN, BSN Entered By: Redmond Pulling on 06/25/2023 13:11:21 -------------------------------------------------------------------------------- Pain Assessment Details Patient Name: Date of Service: Anthony Barnes, Anthony [name] 06/25/2023 12:30 PM Medical Record Number: [id number] Patient Account Number: [id number] Date of Birth/Sex: Treating RN: [birth year] (67 y.o. Anthony Barnes Primary Care Verena Shawgo: Hoy Register Other Clinician: Referring Sargun Rummell: Treating Kennard Fildes/Extender: Josephine Cables Weeks in Treatment: 0 Active Problems Location of Pain Severity and Description of Pain Patient Has Paino No Site Locations Pain Management and Medication Current Pain Management: Electronic Signature(s) Signed: 06/25/2023 5:15:15 PM By: Redmond Pulling RN, BSN Entered By: Redmond Pulling on 06/25/2023 13:01:32 Anthony Barnes ([id number]) [id number] Page 7 of 9 -------------------------------------------------------------------------------- Patient/Caregiver Education Details Patient Name: Date of Service: Anthony Barnes 11/11/2024andnbsp12:30 PM Medical Record Number: [id number] Patient Account Number: [id number] Date of Birth/Gender: Treating RN: 1956/08/13 (67 y.o. Anthony Barnes Primary Care Physician: Hoy Register Other Clinician: Referring Physician: Treating Physician/Extender: Leotis Pain in Treatment: 0 Education Assessment Education Provided To: Patient Education Topics Provided Wound/Skin Impairment: Methods: Explain/Verbal Responses: State content  correctly Electronic Signature(s) Signed: 06/25/2023 5:15:15 PM By: Redmond Pulling  RN, BSN Entered By: Redmond Pulling on 06/25/2023 13:11:35 -------------------------------------------------------------------------------- Wound Assessment Details Patient Name: Date of Service: Anthony Anthony [name] 06/25/2023 12:30 PM Medical Record Number: [id number] Patient Account Number: [id number] Date of Birth/Sex: Treating RN: Nov 15, 1955 (67 y.o. Anthony Barnes Primary Care Melena Hayes: Hoy Register Other Clinician: Referring Annaliza Zia: Treating Jalyiah Shelley/Extender: Josephine Cables Weeks in Treatment: 0 Wound Status Wound Number: 3 Primary Etiology: Abrasion Wound Location: Left, Anterior Lower Leg Secondary Etiology: Diabetic Wound/Ulcer of the Lower Extremity Wounding Event: Trauma Wound Status: Open Date Acquired: 04/17/2023 Comorbid History: Hypertension, Type II Diabetes Weeks Of Treatment: 0 Clustered Wound: No Photos Wound Measurements Length: (cm) 1.2 Width: (cm) 1 Kauer, Plez E ([id number]) Depth: (cm) 0.1 Area: (cm) 0.942 Volume: (cm) 0.094 % Reduction in Area: % Reduction in Volume: [id number].pdf Page 8 of 9 Epithelialization: None Tunneling: No Undermining: No Wound Description Classification: Full Thickness Without Exposed Support Structures Exudate Amount: Medium Exudate Type: Serosanguineous Exudate Color: red, brown Foul Odor After Cleansing: No Slough/Fibrino Yes Wound Bed Granulation Amount: Large (67-100%) Exposed Structure Granulation Quality: Red Fascia Exposed: No Necrotic Amount: Small (1-33%) Fat Layer (Subcutaneous Tissue) Exposed: Yes Necrotic Quality: Adherent Slough Tendon Exposed: No Muscle Exposed: No Joint Exposed: No Bone Exposed: No Periwound Skin Texture Texture Color No Abnormalities Noted: No No Abnormalities Noted: No Moisture No Abnormalities Noted: No Treatment Notes Wound #3 (Lower  Leg) Wound Laterality: Left, Anterior Cleanser Soap and Water Discharge Instruction: May shower and wash wound with dial antibacterial soap and water prior to dressing change. Vashe 5.8 (oz) Discharge Instruction: Cleanse the wound with Vashe prior to applying a clean dressing using gauze sponges, not tissue or cotton balls. Peri-Wound Care Triamcinolone 15 (g) Discharge Instruction: Use triamcinolone 15 (g) as directed Sween Lotion (Moisturizing lotion) Discharge Instruction: Apply moisturizing lotion as directed Topical Skintegrity Hydrogel 4 (oz) Discharge Instruction: Apply hydrogel as directed Primary Dressing Promogran Prisma Matrix, 4.34 (sq in) (silver collagen) Discharge Instruction: Moisten collagen with saline or hydrogel Secondary Dressing ABD Pad, 8x10 Discharge Instruction: Apply over primary dressing as directed. Secured With Compression Wrap Urgo K2, (equivalent to a 4 layer) two layer compression system, regular Discharge Instruction: Apply Urgo K2 as directed (alternative to 4 layer compression). Compression Stockings Add-Ons Electronic Signature(s) Signed: 06/25/2023 5:15:15 PM By: Redmond Pulling RN, BSN Entered By: Redmond Pulling on 06/25/2023 13:04:56 Anthony Barnes ([id number]) [id number] Page 9 of 9 -------------------------------------------------------------------------------- Vitals Details Patient Name: Date of Service: Anthony Anthony [name] 06/25/2023 12:30 PM Medical Record Number: [id number] Patient Account Number: [id number] Date of Birth/Sex: Treating RN: 13-Mar-1956 (67 y.o. Anthony Barnes Primary Care Farha Dano: Hoy Register Other Clinician: Referring Rinoa Garramone: Treating Bijon Mineer/Extender: Josephine Cables Weeks in Treatment: 0 Vital Signs Time Taken: 13:00 Temperature (F): 98.3 Pulse (bpm): 64 Respiratory Rate (breaths/min): 18 Blood Pressure (mmHg): 142/95 Reference Range: 80 - 120 mg /  dl Electronic Signature(s) Signed: 06/25/2023 5:15:15 PM By: Redmond Pulling RN, BSN Entered By: Redmond Pulling on 06/25/2023 13:01:05

## 2023-06-25 NOTE — Progress Notes (Signed)
AKING, SHULMAN ([id number]) [id number] Page 1 of 4 Visit Report for 06/25/2023 Abuse Risk Screen Details Patient Name: Date of Service: Anthony Hark [name] 06/25/2023 12:30 PM Medical Record Number: [id number] Patient Account Number: [id number] Date of Birth/Sex: Treating RN: [birth year] (67 y.o. Cline Cools Primary Care Aydia Maj: Hoy Register Other Clinician: Referring Loralei Radcliffe: Treating Lacosta Hargan/Extender: Josephine Cables Weeks in Treatment: 0 Abuse Risk Screen Items Answer ABUSE RISK SCREEN: Has anyone close to you tried to hurt or harm you recentlyo No Do you feel uncomfortable with anyone in your familyo No Has anyone forced you do things that you didnt want to doo No Electronic Signature(s) Signed: 06/25/2023 5:15:15 PM By: Redmond Pulling RN, BSN Entered By: Redmond Pulling on 06/25/2023 09:40:03 -------------------------------------------------------------------------------- Activities of Daily Living Details Patient Name: Date of Service: Anthony Hark [name] 06/25/2023 12:30 PM Medical Record Number: [id number] Patient Account Number: [id number] Date of Birth/Sex: Treating RN: 1956-03-04 (67 y.o. Cline Cools Primary Care Malaika Arnall: Hoy Register Other Clinician: Referring Sophiagrace Benbrook: Treating Candice Lunney/Extender: Josephine Cables Weeks in Treatment: 0 Activities of Daily Living Items Answer Activities of Daily Living (Please select one for each item) Drive Automobile Completely Able T Medications ake Completely Able Use T elephone Completely Able Care for Appearance Completely Able Use T oilet Completely Able Bath / Shower Completely Able Dress Self Completely Able Feed Self Completely Able Walk Completely Able Get In / Out Bed Completely Able Housework Completely Able Prepare Meals Completely Able Handle Money Completely Able Shop for Self Completely Able Electronic  Signature(s) Signed: 06/25/2023 5:15:15 PM By: Redmond Pulling RN, BSN Entered By: Redmond Pulling on 06/25/2023 09:40:28 Arnetha Massy ([id number]) [phone] [id number] Page 2 of 4 -------------------------------------------------------------------------------- Education Screening Details Patient Name: Date of Service: Anthony Hark [name] 06/25/2023 12:30 PM Medical Record Number: [id number] Patient Account Number: [id number] Date of Birth/Sex: Treating RN: 29-Dec-1955 (67 y.o. Cline Cools Primary Care Nial Hawe: Hoy Register Other Clinician: Referring London Tarnowski: Treating Ferrell Claiborne/Extender: Leotis Pain in Treatment: 0 Learning Preferences/Education Level/Primary Language Learning Preference: Explanation, Demonstration, Printed Material Preferred Language: English Cognitive Barrier Language Barrier: No Translator Needed: No Memory Deficit: No Emotional Barrier: No Cultural/Religious Beliefs Affecting Medical Care: Yes jehovahs witness - no blood products Physical Barrier Impaired Vision: No Impaired Hearing: No Decreased Hand dexterity: No Knowledge/Comprehension Knowledge Level: High Comprehension Level: High Ability to understand written instructions: High Ability to understand verbal instructions: High Motivation Anxiety Level: Calm Cooperation: Cooperative Education Importance: Acknowledges Need Interest in Health Problems: Asks Questions Perception: Coherent Willingness to Engage in Self-Management High Activities: Readiness to Engage in Self-Management High Activities: Electronic Signature(s) Signed: 06/25/2023 5:15:15 PM By: Redmond Pulling RN, BSN Entered By: Redmond Pulling on 06/25/2023 09:41:27 -------------------------------------------------------------------------------- Fall Risk Assessment Details Patient Name: Date of Service: Anthony Harbor, [name] 06/25/2023 12:30 PM Medical Record Number:  [id number] Patient Account Number: [id number] Date of Birth/Sex: Treating RN: 1955/09/13 (67 y.o. Cline Cools Primary Care Keona Sheffler: Hoy Register Other Clinician: Referring Crystallynn Noorani: Treating Reagyn Facemire/Extender: Josephine Cables Weeks in Treatment: 0 Fall Risk Assessment Items Have you had 2 or more falls in the last 12 monthso 0 No Have you had any fall that resulted in injury in the last 12 monthso 0 No [address] EURAL, HOVORKA E ([id number]) [phone] [id number] Page 3 of 4 History of falling - immediate or within 3 months 0 No Secondary diagnosis (Do you have 2 or more medical diagnoseso) 0 No Ambulatory aid  None/bed rest/wheelchair/nurse 0 Yes Crutches/cane/walker 0 No Furniture 0 No Intravenous therapy Access/Saline/Heparin Lock 0 No Gait/Transferring Normal/ bed rest/ wheelchair 0 Yes Weak (short steps with or without shuffle, stooped but able to lift head while walking, may seek 0 No support from furniture) Impaired (short steps with shuffle, may have difficulty arising from chair, head down, impaired 0 No balance) Mental Status Oriented to own ability 0 Yes Electronic Signature(s) Signed: 06/25/2023 5:15:15 PM By: Redmond Pulling RN, BSN Entered By: Redmond Pulling on 06/25/2023 09:41:45 -------------------------------------------------------------------------------- Foot Assessment Details Patient Name: Date of Service: Anthony Harbor, [name] 06/25/2023 12:30 PM Medical Record Number: [id number] Patient Account Number: [id number] Date of Birth/Sex: Treating RN: Sep 25, 1955 (67 y.o. Cline Cools Primary Care Mosiah Bastin: Hoy Register Other Clinician: Referring Brenten Janney: Treating Leslie Jester/Extender: Josephine Cables Weeks in Treatment: 0 Foot Assessment Items Site Locations + = Sensation present, - = Sensation absent, C = Callus, U = Ulcer R = Redness, W = Warmth, M = Maceration, PU =  Pre-ulcerative lesion F = Fissure, S = Swelling, D = Dryness Assessment Right: Left: Other Deformity: No No Prior Foot Ulcer: No No Prior Amputation: No No Charcot Joint: No No Ambulatory Status: Ambulatory Without Help GaitRIDHA, DELLIGATTI ([id number]) [phone] [id number] Page 4 of 4 Electronic Signature(s) Signed: 06/25/2023 5:15:15 PM By: Redmond Pulling RN, BSN Entered By: Redmond Pulling on 06/25/2023 09:58:38 -------------------------------------------------------------------------------- Nutrition Risk Screening Details Patient Name: Date of Service: Anthony Hark [name] 06/25/2023 12:30 PM Medical Record Number: [id number] Patient Account Number: [id number] Date of Birth/Sex: Treating RN: 05-29-1956 (67 y.o. Cline Cools Primary Care Sabin Gibeault: Hoy Register Other Clinician: Referring Marrion Finan: Treating Bobie Kistler/Extender: Josephine Cables Weeks in Treatment: 0 Height (in): 75 Weight (lbs): 310 Body Mass Index (BMI): 38.7 Nutrition Risk Screening Items Score Screening NUTRITION RISK SCREEN: I have an illness or condition that made me change the kind and/or amount of food I eat 0 No I eat fewer than two meals per day 0 No I eat few fruits and vegetables, or milk products 0 No I have three or more drinks of beer, liquor or wine almost every day 0 No I have tooth or mouth problems that make it hard for me to eat 0 No I don't always have enough money to buy the food I need 0 No I eat alone most of the time 0 No I take three or more different prescribed or over-the-counter drugs a day 1 Yes Without wanting to, I have lost or gained 10 pounds in the last six months 0 No I am not always physically able to shop, cook and/or feed myself 0 No Nutrition Protocols Good Risk Protocol Moderate Risk Protocol High Risk Proctocol Risk Level: Good Risk Score: 1 Electronic Signature(s) Signed: 06/25/2023 5:15:15 PM By: Redmond Pulling RN, BSN Entered By: Redmond Pulling on 06/25/2023 09:42:12

## 2023-07-02 ENCOUNTER — Encounter (HOSPITAL_BASED_OUTPATIENT_CLINIC_OR_DEPARTMENT_OTHER): Payer: No Typology Code available for payment source | Admitting: Internal Medicine

## 2023-07-02 DIAGNOSIS — E11622 Type 2 diabetes mellitus with other skin ulcer: Secondary | ICD-10-CM | POA: Diagnosis not present

## 2023-07-02 NOTE — Progress Notes (Signed)
DIDIER, BRUMLEY ([id number]) [id number] Page 1 of 1 Visit Report for 07/02/2023 SuperBill Details Patient Name: Date of Service: Anthony Barnes [name] 07/02/2023 Medical Record Number: [id number] Patient Account Number: [id number] Date of Birth/Sex: Treating RN: 11-13-1955 (67 y.o. M) Primary Care Provider: Hoy Register Other Clinician: Thayer Dallas Referring Provider: Treating Provider/Extender: Josephine Cables Weeks in Treatment: 1 Diagnosis Coding ICD-10 Codes Code Description [phone] Non-pressure chronic ulcer of other part of left lower leg with other specified severity S80.812D Abrasion, left lower leg, subsequent encounter E11.622 Type 2 diabetes mellitus with other skin ulcer I87.312 Chronic venous hypertension (idiopathic) with ulcer of left lower extremity Facility Procedures CPT4 Code Description Modifier Quantity [id number] (Facility Use Only) [phone] - APPLY MULTLAY COMPRS LWR LT LEG 1 Electronic Signature(s) Signed: 07/02/2023 4:25:57 PM By: Thayer Dallas Signed: 07/02/2023 4:31:26 PM By: Baltazar Najjar MD Entered By: Thayer Dallas on 07/02/2023 13:25:29

## 2023-07-02 NOTE — Progress Notes (Signed)
DANTHONY, STONEBREAKER ([id number]) [id number] Page 1 of 4 Visit Report for 07/02/2023 Arrival Information Details Patient Name: Date of Service: Anthony Hark [name] 07/02/2023 3:30 PM Medical Record Number: [id number] Patient Account Number: [id number] Date of Birth/Sex: Treating RN: April 12, 1956 (67 y.o. M) Primary Care Annie Roseboom: Hoy Register Other Clinician: Referring Batoul Limes: Treating Abbott Jasinski/Extender: Josephine Cables Weeks in Treatment: 1 Visit Information History Since Last Visit Added or deleted any medications: No Patient Arrived: Ambulatory Any new allergies or adverse reactions: No Arrival Time: 16:11 Had a fall or experienced change in No Accompanied By: self activities of daily living that may affect Transfer Assistance: None risk of falls: Patient Identification Verified: Yes Signs or symptoms of abuse/neglect since last visito No Secondary Verification Process Completed: Yes Hospitalized since last visit: No Implantable device outside of the clinic excluding No cellular tissue based products placed in the center since last visit: Has Dressing in Place as Prescribed: Yes Has Compression in Place as Prescribed: Yes Pain Present Now: No Electronic Signature(s) Signed: 07/02/2023 4:25:57 PM By: Thayer Dallas Entered By: Thayer Dallas on 07/02/2023 16:11:46 -------------------------------------------------------------------------------- Encounter Discharge Information Details Patient Name: Date of Service: Anthony Barnes, Anthony [name] 07/02/2023 3:30 PM Medical Record Number: [id number] Patient Account Number: [id number] Date of Birth/Sex: Treating RN: 09-Aug-1956 (67 y.o. M) Primary Care Mortimer Bair: Hoy Register Other Clinician: Thayer Dallas Referring Kristell Wooding: Treating Deirdre Gryder/Extender: Leotis Pain in Treatment: 1 Encounter Discharge Information Items Discharge Condition: Stable Ambulatory  Status: Ambulatory Discharge Destination: Home Transportation: Private Auto Accompanied By: self Schedule Follow-up Appointment: Yes Clinical Summary of Care: Electronic Signature(s) Signed: 07/02/2023 4:25:57 PM By: Thayer Dallas Entered By: Thayer Dallas on 07/02/2023 16:25:04 Anthony Barnes ([id number]) [id number] Page 2 of 4 -------------------------------------------------------------------------------- Patient/Caregiver Education Details Patient Name: Date of Service: Anthony Barnes 11/18/2024andnbsp3:30 PM Medical Record Number: [id number] Patient Account Number: [id number] Date of Birth/Gender: Treating RN: 04-Apr-1956 (67 y.o. M) Primary Care Physician: Hoy Register Other Clinician: Thayer Dallas Referring Physician: Treating Physician/Extender: Leotis Pain in Treatment: 1 Education Assessment Education Provided To: Patient Education Topics Provided Electronic Signature(s) Signed: 07/02/2023 4:25:57 PM By: Thayer Dallas Entered By: Thayer Dallas on 07/02/2023 16:12:59 -------------------------------------------------------------------------------- Wound Assessment Details Patient Name: Date of Service: Anthony Hark [name] 07/02/2023 3:30 PM Medical Record Number: [id number] Patient Account Number: [id number] Date of Birth/Sex: Treating RN: [birth year] (67 y.o. M) Primary Care Yevonne Yokum: Hoy Register Other Clinician: Referring Anayely Constantine: Treating Nikitia Asbill/Extender: Josephine Cables Weeks in Treatment: 1 Wound Status Wound Number: 3 Primary Etiology: Abrasion Wound Location: Left, Anterior Lower Leg Secondary Etiology: Diabetic Wound/Ulcer of the Lower Extremity Wounding Event: Trauma Wound Status: Open Date Acquired: 04/17/2023 Comorbid History: Hypertension, Type II Diabetes Weeks Of Treatment: 1 Clustered Wound: No Wound Measurements Length: (cm) 1.2 Width: (cm) 1 Depth:  (cm) 0.1 Area: (cm) 0.942 Volume: (cm) 0.094 % Reduction in Area: 0% % Reduction in Volume: 0% Epithelialization: None Wound Description Classification: Full Thickness Without Exposed Suppor Exudate Amount: Medium Exudate Type: Serosanguineous Exudate Color: red, brown t Structures Foul Odor After Cleansing: No Slough/Fibrino Yes Wound Bed Granulation Amount: Large (67-100%) Exposed Structure Granulation Quality: Red Fascia Exposed: No Necrotic Amount: Small (1-33%) Fat Layer (Subcutaneous Tissue) Exposed: Yes Necrotic Quality: Adherent Slough Tendon Exposed: No Muscle Exposed: No Joint Exposed: No Bone Exposed: No Anthony Barnes, Anthony Barnes ([id number]) [phone].pdf Page 3 of 4 Periwound Skin Texture Texture Color No Abnormalities Noted: No No Abnormalities Noted: No Moisture No Abnormalities Noted: No Treatment  Notes Wound #3 (Lower Leg) Wound Laterality: Left, Anterior Cleanser Soap and Water Discharge Instruction: May shower and wash wound with dial antibacterial soap and water prior to dressing change. Vashe 5.8 (oz) Discharge Instruction: Cleanse the wound with Vashe prior to applying a clean dressing using gauze sponges, not tissue or cotton balls. Peri-Wound Care Triamcinolone 15 (g) Discharge Instruction: Use triamcinolone 15 (g) as directed Sween Lotion (Moisturizing lotion) Discharge Instruction: Apply moisturizing lotion as directed Topical Skintegrity Hydrogel 4 (oz) Discharge Instruction: Apply hydrogel as directed Primary Dressing Promogran Prisma Matrix, 4.34 (sq in) (silver collagen) Discharge Instruction: Moisten collagen with saline or hydrogel Secondary Dressing ABD Pad, 8x10 Discharge Instruction: Apply over primary dressing as directed. Secured With Compression Wrap Urgo K2, (equivalent to a 4 layer) two layer compression system, regular Discharge Instruction: Apply Urgo K2 as directed (alternative to 4 layer  compression). Compression Stockings Add-Ons Electronic Signature(s) Signed: 07/02/2023 4:25:57 PM By: Thayer Dallas Entered By: Thayer Dallas on 07/02/2023 16:12:17 -------------------------------------------------------------------------------- Vitals Details Patient Name: Date of Service: Anthony Barnes, Anthony [name] 07/02/2023 3:30 PM Medical Record Number: [id number] Patient Account Number: [id number] Date of Birth/Sex: Treating RN: July 08, 1956 (67 y.o. M) Primary Care Tranae Laramie: Hoy Register Other Clinician: Referring Nyko Gell: Treating Kamla Skilton/Extender: Luster Landsberg, Odette Horns Weeks in Treatment: 1 Vital Signs Time Taken: 14:11 Reference Range: 80 - 120 mg / dl Electronic Signature(s) Signed: 07/02/2023 4:25:57 PM By: Thayer Dallas Entered By: Thayer Dallas on 07/02/2023 16:11:56 Anthony Barnes ([id number]) [id number] Page 4 of 4

## 2023-07-10 ENCOUNTER — Encounter (HOSPITAL_BASED_OUTPATIENT_CLINIC_OR_DEPARTMENT_OTHER): Payer: No Typology Code available for payment source | Admitting: General Surgery

## 2023-07-10 DIAGNOSIS — E11622 Type 2 diabetes mellitus with other skin ulcer: Secondary | ICD-10-CM | POA: Diagnosis not present

## 2023-07-10 NOTE — Progress Notes (Signed)
JALENE, DIBLASI ([id number]) [id number] Page 1 of 4 Visit Report for 07/10/2023 Arrival Information Details Patient Name: Date of Service: [name] 07/10/2023 3:00 PM Medical Record Number: [id number] Patient Account Number: [id number] Date of Birth/Sex: Treating RN: Oct 03, 1955 (67 y.o. M) Primary Care Mehtaab Mayeda: Hoy Register Other Clinician: Referring Pualani Borah: Treating Labradford Schnitker/Extender: Camillo Flaming Weeks in Treatment: 2 Visit Information History Since Last Visit Added or deleted any medications: No Patient Arrived: Ambulatory Any new allergies or adverse reactions: No Arrival Time: 15:03 Had a fall or experienced change in No Accompanied By: self activities of daily living that may affect Transfer Assistance: None risk of falls: Patient Identification Verified: Yes Signs or symptoms of abuse/neglect since last visito No Secondary Verification Process Completed: Yes Hospitalized since last visit: No Patient Requires Transmission-Based Precautions: No Implantable device outside of the clinic excluding No Patient Has Alerts: No cellular tissue based products placed in the center since last visit: Has Dressing in Place as Prescribed: Yes Has Compression in Place as Prescribed: Yes Pain Present Now: No Electronic Signature(s) Signed: 07/10/2023 3:43:47 PM By: Thayer Dallas Entered By: Thayer Dallas on 07/10/2023 12:06:29 -------------------------------------------------------------------------------- Compression Therapy Details Patient Name: Date of Service: [name], [name] 07/10/2023 3:00 PM Medical Record Number: [id number] Patient Account Number: [id number] Date of Birth/Sex: Treating RN: Mar 01, 1956 (67 y.o. M) Primary Care Cayson Kalb: Hoy Register Other Clinician: Referring Griffin Gerrard: Treating Trennon Torbeck/Extender: Whitney Muse, Odette Horns Weeks in Treatment: 2 Compression Therapy Performed  for Wound Assessment: Wound #3 Left,Anterior Lower Leg Performed By: Clinician Thayer Dallas, Compression Type: Double Layer Electronic Signature(s) Signed: 07/10/2023 3:43:47 PM By: Thayer Dallas Entered By: Thayer Dallas on 07/10/2023 12:07:51 -------------------------------------------------------------------------------- Encounter Discharge Information Details Patient Name: Date of Service: [name], [name] 07/10/2023 3:00 PM Medical Record Number: [id number] Patient Account Number: [id number] JERMY, ALLIE ([id number]) [id number] Page 2 of 4 Date of Birth/Sex: Treating RN: [birth year] (67 y.o. M) Primary Care Adisyn Ruscitti: Hoy Register Other Clinician: Thayer Dallas Referring Favour Aleshire: Treating Ishmael Berkovich/Extender: Camillo Flaming Weeks in Treatment: 2 Encounter Discharge Information Items Discharge Condition: Stable Ambulatory Status: Ambulatory Discharge Destination: Home Transportation: Private Auto Accompanied By: self Schedule Follow-up Appointment: Yes Clinical Summary of Care: Electronic Signature(s) Signed: 07/10/2023 3:43:47 PM By: Thayer Dallas Entered By: Thayer Dallas on 07/10/2023 12:42:42 -------------------------------------------------------------------------------- Patient/Caregiver Education Details Patient Name: Date of Service: [name], [name] 11/26/2024andnbsp3:00 PM Medical Record Number: [id number] Patient Account Number: [id number] Date of Birth/Gender: Treating RN: 1956/01/25 (67 y.o. M) Primary Care Physician: Hoy Register Other Clinician: Thayer Dallas Referring Physician: Treating Physician/Extender: Camillo Flaming Weeks in Treatment: 2 Education Assessment Education Provided To: Patient Education Topics Provided Electronic Signature(s) Signed: 07/10/2023 3:43:47 PM By: Thayer Dallas Entered By: Thayer Dallas on 07/10/2023  12:42:27 -------------------------------------------------------------------------------- Wound Assessment Details Patient Name: Date of Service: [name], [name] 07/10/2023 3:00 PM Medical Record Number: [id number] Patient Account Number: [id number] Date of Birth/Sex: Treating RN: [birth year] (67 y.o. M) Primary Care Annalysse Shoemaker: Hoy Register Other Clinician: Referring Aisley Whan: Treating Safina Huard/Extender: Camillo Flaming Weeks in Treatment: 2 Wound Status Wound Number: 3 Primary Etiology: Abrasion Wound Location: Left, Anterior Lower Leg Secondary Etiology: Diabetic Wound/Ulcer of the Lower Extremity Wounding Event: Trauma Wound Status: Open Date Acquired: 04/17/2023 Weeks Of Treatment: 2 Clustered Wound: No Wound Measurements PAPPU, MCILLWAIN E ([id number]) Length: (cm) 1.2 Width: (cm) 1 Depth: (cm) 0.1 Area: (cm) 0.942 Volume: (cm) 0.094 [id number] Page 3 of 4 % Reduction in Area: 0% %  Reduction in Volume: 0% Wound Description Classification: Full Thickness Without Exposed Suppor Exudate Amount: Medium Exudate Type: Serosanguineous Exudate Color: red, brown t Structures Periwound Skin Texture Texture Color No Abnormalities Noted: No No Abnormalities Noted: No Moisture No Abnormalities Noted: No Treatment Notes Wound #3 (Lower Leg) Wound Laterality: Left, Anterior Cleanser Soap and Water Discharge Instruction: May shower and wash wound with dial antibacterial soap and water prior to dressing change. Vashe 5.8 (oz) Discharge Instruction: Cleanse the wound with Vashe prior to applying a clean dressing using gauze sponges, not tissue or cotton balls. Peri-Wound Care Triamcinolone 15 (g) Discharge Instruction: Use triamcinolone 15 (g) as directed Sween Lotion (Moisturizing lotion) Discharge Instruction: Apply moisturizing lotion as directed Topical Skintegrity Hydrogel 4 (oz) Discharge Instruction: Apply hydrogel as  directed Primary Dressing Promogran Prisma Matrix, 4.34 (sq in) (silver collagen) Discharge Instruction: Moisten collagen with saline or hydrogel Secondary Dressing ABD Pad, 8x10 Discharge Instruction: Apply over primary dressing as directed. Secured With Compression Wrap Urgo K2, (equivalent to a 4 layer) two layer compression system, regular Discharge Instruction: Apply Urgo K2 as directed (alternative to 4 layer compression). Compression Stockings Add-Ons Electronic Signature(s) Signed: 07/10/2023 3:43:47 PM By: Thayer Dallas Entered By: Thayer Dallas on 07/10/2023 12:06:50 -------------------------------------------------------------------------------- Vitals Details Patient Name: Date of Service: [name], [name] 07/10/2023 3:00 PM Medical Record Number: [id number] Patient Account Number: [id number] Date of Birth/Sex: Treating RN: [birth year] (67 y.o. Majour Lamm, Janetta Hora ([id number]) [id number] Page 4 of 4 Primary Care Yekaterina Escutia: Hoy Register Other Clinician: Referring Anniyah Mood: Treating Torell Minder/Extender: Whitney Muse, Enobong Weeks in Treatment: 2 Vital Signs Time Taken: 15:06 Reference Range: 80 - 120 mg / dl Electronic Signature(s) Signed: 07/10/2023 3:43:47 PM By: Thayer Dallas Entered By: Thayer Dallas on 07/10/2023 12:06:40

## 2023-07-11 NOTE — Progress Notes (Signed)
[name], [name] ([id number]) [id number] Page 1 of 1 Visit Report for 07/10/2023 SuperBill Details Patient Name: Date of Service: Anthony Barnes 07/10/2023 Medical Record Number: [id number] Patient Account Number: [id number] Date of Birth/Sex: Treating RN: 1955/12/20 (67 y.o. M) Primary Care Provider: Hoy Register Other Clinician: Referring Provider: Treating Provider/Extender: Whitney Muse, Odette Horns Weeks in Treatment: 2 Diagnosis Coding ICD-10 Codes Code Description [phone] Non-pressure chronic ulcer of other part of left lower leg with other specified severity S80.812D Abrasion, left lower leg, subsequent encounter E11.622 Type 2 diabetes mellitus with other skin ulcer I87.312 Chronic venous hypertension (idiopathic) with ulcer of left lower extremity Facility Procedures CPT4 Code Description Modifier Quantity [id number] (Facility Use Only) [phone] - APPLY MULTLAY COMPRS LWR LT LEG 1 Electronic Signature(s) Signed: 07/10/2023 3:43:47 PM By: Thayer Dallas Signed: 07/10/2023 5:33:59 PM By: Duanne Guess MD FACS Entered By: Thayer Dallas on 07/10/2023 12:43:14

## 2023-07-17 ENCOUNTER — Ambulatory Visit (HOSPITAL_BASED_OUTPATIENT_CLINIC_OR_DEPARTMENT_OTHER): Payer: No Typology Code available for payment source | Admitting: Internal Medicine

## 2023-07-19 ENCOUNTER — Encounter (HOSPITAL_BASED_OUTPATIENT_CLINIC_OR_DEPARTMENT_OTHER): Payer: No Typology Code available for payment source | Attending: Internal Medicine | Admitting: Internal Medicine

## 2023-07-19 DIAGNOSIS — E1151 Type 2 diabetes mellitus with diabetic peripheral angiopathy without gangrene: Secondary | ICD-10-CM | POA: Diagnosis not present

## 2023-07-19 DIAGNOSIS — I872 Venous insufficiency (chronic) (peripheral): Secondary | ICD-10-CM | POA: Diagnosis not present

## 2023-07-19 DIAGNOSIS — E11622 Type 2 diabetes mellitus with other skin ulcer: Secondary | ICD-10-CM | POA: Insufficient documentation

## 2023-07-19 DIAGNOSIS — L97828 Non-pressure chronic ulcer of other part of left lower leg with other specified severity: Secondary | ICD-10-CM | POA: Diagnosis not present

## 2023-07-19 DIAGNOSIS — I10 Essential (primary) hypertension: Secondary | ICD-10-CM | POA: Diagnosis not present

## 2023-07-19 DIAGNOSIS — E1142 Type 2 diabetes mellitus with diabetic polyneuropathy: Secondary | ICD-10-CM | POA: Insufficient documentation

## 2023-07-19 DIAGNOSIS — S80812A Abrasion, left lower leg, initial encounter: Secondary | ICD-10-CM | POA: Diagnosis not present

## 2023-07-19 DIAGNOSIS — I890 Lymphedema, not elsewhere classified: Secondary | ICD-10-CM | POA: Diagnosis not present

## 2023-07-20 NOTE — Progress Notes (Signed)
MARKELL, AVRAM ([id number]) [id number] Page 1 of 8 Visit Report for 07/19/2023 Arrival Information Details Patient Name: Date of Service: Anthony Barnes [name] 07/19/2023 8:45 A M Medical Record Number: [id number] Patient Account Number: [id number] Date of Birth/Sex: Treating RN: September 20, 1955 (67 y.o. Bayard Hugger, Bonita Quin Primary Care Greig Altergott: Hoy Register Other Clinician: Referring Dee Paden: Treating Sharlot Sturkey/Extender: Leotis Pain in Treatment: 3 Visit Information History Since Last Visit Added or deleted any medications: No Patient Arrived: Ambulatory Any new allergies or adverse reactions: No Arrival Time: 09:31 Had a fall or experienced change in No Accompanied By: self activities of daily living that may affect Transfer Assistance: None risk of falls: Patient Identification Verified: Yes Signs or symptoms of abuse/neglect since last visito No Secondary Verification Process Completed: Yes Hospitalized since last visit: No Patient Requires Transmission-Based Precautions: No Implantable device outside of the clinic excluding No Patient Has Alerts: No cellular tissue based products placed in the center since last visit: Has Dressing in Place as Prescribed: Yes Has Compression in Place as Prescribed: Yes Pain Present Now: Yes Electronic Signature(s) Signed: 07/19/2023 4:48:36 PM By: Zenaida Deed RN, BSN Entered By: Zenaida Deed on 07/19/2023 09:34:06 -------------------------------------------------------------------------------- Compression Therapy Details Patient Name: Date of Service: Anthony Barnes, Anthony [name] 07/19/2023 8:45 A M Medical Record Number: [id number] Patient Account Number: [id number] Date of Birth/Sex: Treating RN: [birth year] (67 y.o. Anthony Barnes Primary Care Silver Parkey: Hoy Register Other Clinician: Referring Arshia Rondon: Treating Ahmiyah Coil/Extender: Josephine Cables Weeks in  Treatment: 3 Compression Therapy Performed for Wound Assessment: Wound #3 Left,Anterior Lower Leg Performed By: Clinician Zenaida Deed, RN Compression Type: Double Layer Post Procedure Diagnosis Same as Pre-procedure Notes Urgo Electronic Signature(s) Signed: 07/19/2023 4:48:36 PM By: Zenaida Deed RN, BSN Entered By: Zenaida Deed on 07/19/2023 09:47:33 Arnetha Massy ([id number]) [id number] Page 2 of 8 -------------------------------------------------------------------------------- Encounter Discharge Information Details Patient Name: Date of Service: Anthony Barnes 07/19/2023 8:45 A M Medical Record Number: [id number] Patient Account Number: [id number] Date of Birth/Sex: Treating RN: April 28, 1956 (67 y.o. Anthony Barnes Primary Care Lakitha Gordy: Hoy Register Other Clinician: Referring Leontae Bostock: Treating Philana Younis/Extender: Leotis Pain in Treatment: 3 Encounter Discharge Information Items Discharge Condition: Stable Ambulatory Status: Ambulatory Discharge Destination: Home Transportation: Private Auto Accompanied By: self Schedule Follow-up Appointment: Yes Clinical Summary of Care: Patient Declined Electronic Signature(s) Signed: 07/19/2023 4:48:36 PM By: Zenaida Deed RN, BSN Entered By: Zenaida Deed on 07/19/2023 10:13:06 -------------------------------------------------------------------------------- Lower Extremity Assessment Details Patient Name: Date of Service: Anthony Barnes, Anthony [name] 07/19/2023 8:45 A M Medical Record Number: [id number] Patient Account Number: [id number] Date of Birth/Sex: Treating RN: [birth year] (67 y.o. Anthony Barnes Primary Care Jaclyn Carew: Hoy Register Other Clinician: Referring Kennadee Walthour: Treating Braylyn Kalter/Extender: Josephine Cables Weeks in Treatment: 3 Edema Assessment Assessed: [Left: No] [Right: No] Edema: [Left: Ye] [Right: s] Calf Left:  Right: Point of Measurement: From Medial Instep 41 cm Ankle Left: Right: Point of Measurement: From Medial Instep 25 cm Vascular Assessment Pulses: Dorsalis Pedis Palpable: [Left:Yes] Extremity colors, hair growth, and conditions: Extremity Color: [Left:Normal] Hair Growth on Extremity: [Left:No] Temperature of Extremity: [Left:Warm > 3 seconds] Electronic Signature(s) Signed: 07/19/2023 4:48:36 PM By: Zenaida Deed RN, BSN Entered By: Zenaida Deed on 07/19/2023 09:41:14 Arnetha Massy ([id number]) [id number] Page 3 of 8 -------------------------------------------------------------------------------- Multi Wound Chart Details Patient Name: Date of Service: Anthony Barnes [name] 07/19/2023 8:45 A M Medical Record Number: [id number] Patient Account Number: [id number] Date of Birth/Sex: Treating RN:  12-06-1955 (67 y.o. 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 Secondary Etiology: Extremity Hypertension, Type II Diabetes N/A N/A Comorbid History: 04/17/2023 N/A N/A Date Acquired: 3 N/A N/A Weeks of Treatment: Open N/A N/A Wound Status: No N/A N/A Wound Recurrence: 1.8x1.6x0.1 N/A N/A Measurements L x W x D (cm) 2.262 N/A N/A A (cm) : rea 0.226 N/A N/A Volume (cm) : -140.10% N/A N/A % Reduction in Area: -140.40% N/A N/A % Reduction in Volume: Full Thickness Without Exposed N/A N/A Classification: Support Structures Medium N/A N/A Exudate Amount: Serosanguineous N/A  N/A Exudate Type: red, brown N/A N/A Exudate Color: Flat and Intact N/A N/A Wound Margin: Large (67-100%) N/A N/A Granulation Amount: Red N/A N/A Granulation Quality: Small (1-33%) N/A N/A Necrotic Amount: Fat Layer (Subcutaneous Tissue): Yes N/A N/A Exposed Structures: Fascia: No Tendon: No Muscle: No Joint: No Bone: No Small (1-33%) N/A N/A Epithelialization: No Abnormalities Noted N/A N/A Periwound Skin Texture: No Abnormalities Noted N/A N/A Periwound Skin Moisture: Hemosiderin Staining: Yes N/A N/A Periwound Skin Color: No Abnormality N/A N/A Temperature: Yes N/A N/A Tenderness on Palpation: Chemical Cauterization N/A N/A Procedures Performed: Compression Therapy Treatment Notes Electronic Signature(s) JAEVYN, TABACCO ([id number]) [id number] Page 4 of 8 Signed: 07/19/2023 5:41:20 PM By: Baltazar Najjar MD Entered By: Baltazar Najjar on 07/19/2023 10:06:42 -------------------------------------------------------------------------------- Multi-Disciplinary Care Plan Details Patient Name: Date of Service: Anthony Barnes, Anthony [name] 07/19/2023 8:45 A M Medical Record Number: [id number] Patient Account Number: [id number] Date of Birth/Sex: Treating RN: 09-18-1955 (67 y.o. Anthony Barnes Primary Care Devlyn Retter: Hoy Register Other Clinician: Referring Nicandro Perrault: Treating Daysean Tinkham/Extender: Josephine Cables Weeks in Treatment: 3 Active Inactive Wound/Skin Impairment Nursing Diagnoses: Impaired tissue integrity Knowledge deficit related to ulceration/compromised skin integrity Goals: Patient/caregiver will verbalize understanding of skin care regimen Date Initiated: 06/25/2023 Target Resolution Date: 08/09/2023 Goal Status: Active Ulcer/skin breakdown will have a volume reduction of 30% by week 4 Date Initiated: 06/25/2023 Target Resolution Date: 08/09/2023 Goal Status: Active Interventions: Assess patient/caregiver  ability to obtain necessary supplies Assess patient/caregiver ability to perform ulcer/skin care regimen upon admission and as needed Assess ulceration(s) every visit Provide education on ulcer and skin care Notes: Electronic Signature(s) Signed: 07/19/2023 4:48:36 PM By: Zenaida Deed RN, BSN Entered By: Zenaida Deed on 07/19/2023 09:31:32 -------------------------------------------------------------------------------- Pain Assessment Details Patient Name: Date of Service: Anthony Barnes, Anthony [name] 07/19/2023 8:45 A M Medical Record Number: [id number] Patient Account Number: [id number] Date of Birth/Sex: Treating RN: August 27, 1955 (67 y.o. Anthony Barnes Primary Care Laird Runnion: Hoy Register Other Clinician: Referring Carvin Almas: Treating Taneika Choi/Extender: Josephine Cables Weeks in Treatment: 3 Active Problems Location of Pain Severity and Description of Pain Patient Has Paino Yes Site Locations Pain Location: LIGE, BLANCHETTE ([id number]) [id number] Page 5 of 8 Pain Location: Pain in Ulcers With Dressing Change: No Duration of the Pain. Constant / Intermittento Intermittent Rate the pain. Current Pain Level: 0 Worst Pain Level: 4 Least Pain Level: 0 Character of Pain Describe the Pain: Stabbing Pain Management and Medication Current Pain Management: Is the Current Pain Management Adequate: Adequate How does your wound impact your activities of daily livingo Sleep: No Bathing: No Appetite: No Relationship  With Others: No Bladder Continence: No Emotions: No Bowel Continence: No Work: No Toileting: No Drive: No Dressing: No Hobbies: No Electronic Signature(s) Signed: 07/19/2023 4:48:36 PM By: Zenaida Deed RN, BSN Entered By: Zenaida Deed on 07/19/2023 09:35:01 -------------------------------------------------------------------------------- Patient/Caregiver Education Details Patient Name: Date of Service: Anthony Barnes, Anthony [name] 12/5/2024andnbsp8:45 A M Medical Record Number: [id number] Patient Account Number: [id number] Date of Birth/Gender: Treating RN: June 17, 1956 (67 y.o. Anthony Barnes Primary Care Physician: Hoy Register Other Clinician: Referring Physician: Treating Physician/Extender: Leotis Pain in Treatment: 3 Education Assessment Education Provided To: Patient Education Topics Provided Venous: Methods: Explain/Verbal Responses: Reinforcements needed, State content correctly Wound/Skin Impairment: Methods: Explain/Verbal Responses: Reinforcements needed, State content correctly Electronic Signature(s) Signed: 07/19/2023 4:48:36 PM By: Zenaida Deed RN, BSN Arnetha Massy ([id number]) [id number] Page 6 of 8 Entered By: Zenaida Deed on 07/19/2023 09:31:04 -------------------------------------------------------------------------------- Wound Assessment Details Patient Name: Date of Service: Anthony Barnes [name] 07/19/2023 8:45 A M Medical Record Number: [id number] Patient Account Number: [id number] Date of Birth/Sex: Treating RN: [birth year] (67 y.o. Anthony Barnes Primary Care Maelie Chriswell: Hoy Register Other Clinician: Referring Ronson Hagins: Treating Kaleesi Guyton/Extender: Josephine Cables Weeks in Treatment: 3 Wound Status Wound Number: 3 Primary Etiology: Abrasion Wound Location: Left, Anterior Lower Leg Secondary Etiology: Diabetic Wound/Ulcer of the Lower Extremity Wounding Event: Trauma Wound Status: Open Date Acquired: 04/17/2023 Comorbid History: Hypertension, Type II Diabetes Weeks Of Treatment: 3 Clustered Wound: No Photos Wound Measurements Length: (cm) 1.8 Width: (cm) 1.6 Depth: (cm) 0.1 Area: (cm) 2.262 Volume: (cm) 0.226 % Reduction in Area: -140.1% % Reduction in Volume: -140.4% Epithelialization: Small (1-33%) Tunneling: No Undermining: No Wound Description Classification:  Full Thickness Without Exposed Support Structures Wound Margin: Flat and Intact Exudate Amount: Medium Exudate Type: Serosanguineous Exudate Color: red, brown Foul Odor After Cleansing: No Slough/Fibrino Yes Wound Bed Granulation Amount: Large (67-100%) Exposed Structure Granulation Quality: Red Fascia Exposed: No Necrotic Amount: Small (1-33%) Fat Layer (Subcutaneous Tissue) Exposed: Yes Necrotic Quality: Adherent Slough Tendon Exposed: No Muscle Exposed: No Joint Exposed: No Bone Exposed: No Periwound Skin Texture Texture Color No Abnormalities Noted: Yes No Abnormalities Noted: No Hemosiderin Staining: Yes Moisture No Abnormalities Noted: Yes Temperature / Pain Temperature: No Abnormality Tenderness on Palpation: Yes Treatment Notes AMNER, KNOEDLER ([id number]) [id number] Page 7 of 8 Wound #3 (Lower Leg) Wound Laterality: Left, Anterior Cleanser Soap and Water Discharge Instruction: May shower and wash wound with dial antibacterial soap and water prior to dressing change. Vashe 5.8 (oz) Discharge Instruction: Cleanse the wound with Vashe prior to applying a clean dressing using gauze sponges, not tissue or cotton balls. Peri-Wound Care Triamcinolone 15 (g) Discharge Instruction: Use triamcinolone 15 (g) as directed Zinc Oxide Ointment 30g tube Discharge Instruction: Apply Zinc Oxide to periwound with each dressing change Sween Lotion (Moisturizing lotion) Discharge Instruction: Apply moisturizing lotion as directed Topical Skintegrity Hydrogel 4 (oz) Discharge Instruction: Apply hydrogel as directed Primary Dressing Hydrofera Blue Ready Transfer Foam, 2.5x2.5 (in/in) Discharge Instruction: Apply directly to wound bed as directed Secondary Dressing Woven Gauze Sponge, Non-Sterile 4x4 in Discharge Instruction: Apply over primary dressing as directed. Zetuvit Plus 4x8 in Discharge Instruction: Apply over primary dressing as  directed. Secured With Compression Wrap Urgo K2, (equivalent to a 4 layer) two layer compression system, regular Discharge Instruction: Apply Urgo K2 as directed (alternative to 4 layer compression). Compression Stockings Add-Ons Electronic Signature(s) Signed: 07/19/2023 4:48:36 PM By: Zenaida Deed RN, BSN Entered By: Zenaida Deed on 07/19/2023 09:46:42 --------------------------------------------------------------------------------  Vitals Details Patient Name: Date of Service: Anthony Barnes [name] 07/19/2023 8:45 A M Medical Record Number: [id number] Patient Account Number: [id number] Date of Birth/Sex: Treating RN: 08-10-1956 (67 y.o. Anthony Barnes Primary Care Lasheka Kempner: Hoy Register Other Clinician: Referring Argus Caraher: Treating Anistyn Graddy/Extender: Josephine Cables Weeks in Treatment: 3 Vital Signs Time Taken: 09:34 Temperature (F): 98.5 Pulse (bpm): 65 Respiratory Rate (breaths/min): 18 Blood Pressure (mmHg): 141/84 Reference Range: 80 - 120 mg / dl Electronic Signature(s) Signed: 07/19/2023 4:48:36 PM By: Zenaida Deed RN, BSN Entered By: Zenaida Deed on 07/19/2023 09:34:30 Arnetha Massy ([id number]) [id number] Page 8 of 8

## 2023-07-20 NOTE — Progress Notes (Signed)
WINCE, STENHOUSE ([id number]) [id number] Page 1 of 11 Visit Report for 07/19/2023 HPI Details Patient Name: Date of Service: Anthony Barnes [name] 07/19/2023 8:45 A M Medical Record Number: [id number] Patient Account Number: [id number] Date of Birth/Sex: Treating RN: Jan 09, 1956 (67 y.o. M) Primary Care Provider: Hoy Register Other Clinician: Referring Provider: Treating Provider/Extender: Josephine Cables Weeks in Treatment: 3 History of Present Illness HPI Description: ADMISSION 03/12/18 This is a 67 year old and it was a type II diabetic reasonably poorly controlled with a recent hemoglobin A1c of 8.2. He was tending to his lawn on 01/15/18 when his weed Anthony Barnes sent a rock up word hitting him in the right anterior leg. He was seen in his primary M.D. office is same time. An x-ray showed no abnormality. He was given a course of cephalexin. Since then he's been applying peroxide and topical antibiotics to the wound. He does not have a history of chronic wounds however he does have chronic edema in the lower legs. He is complaining of pain in the right leg wound but no real history of claudication. Past medical history includes type 2 diabetes, hypertension, morbid obesity. ABIs in the right leg were noncompressible 03/19/18 on evaluation today patient appears to be tolerating the wrap in general fairly well. He states he's not having that much pain in regard to the wound itself. With that being said he is having some issues with slough buildup on the surface of the wound the Iodoflex does seem to be helpful in that regard. He is still having discomfort he wonders if I can send in a prescription for ibuprofen or something of like to help him out. I do believe that the prox end may be of benefit for him. 03/25/18 on evaluation today patient appears to be doing very well in regard to his right lateral lower [name]. He's been tolerating  the dressing changes without complication that is the wraps. Nonetheless he does continue to have pain he states is been dreading the possibility of having to have debridement again today. His first debridement experience was not optimal. With that being said he does seem to be showing signs of improvement there does appear to be little bit more granulation he does have a lot of slough that really does need to breeding away. 04/04/18;the patient went for arterial studies that were really quite normal. ABI on the right at 1.19 with triphasic waveforms on the left 1.11 with triphasic waveforms. TBI 0.93 on the right and 0.95 on the left. This suggests T should be able to tolerate even 4 layer compression. He is however complaining of a lot of pain with our wraps I'm wondering whether the pain is simply Iodoflex. I changed him to [employer] I'm still going to try to keep him in 3 layer compression 04/12/18 on evaluation today patient actually appears to be doing rather well in regard to his right lower [name] ulcer. He is making good progress although it is slow still I do believe that the Santyl is much better than the Iodoflex. The good news is the patient seems to be tolerating the dressing changes without complication now that we've gotten good of the Iodoflex which really did burn him quite significantly. Otherwise there's no evidence of infection. 04/17/18 on evaluation today patient actually appears to be showing signs of improvement in regard to the ulcer. Unfortunately he's had somewhat of a rough day due to the fact that he found out that  one of his friends suddenly and unexpectedly passed today. He states he's not sure he's in the frame of mind to allow me to debride the wound at this point. Nonetheless I do feel like he is showing signs of the wound getting better little by little each week. 04/24/18 on evaluation today patient's ulcer actually appears to be showing some signs  of improvement albeit slow. He has been tolerating the dressing changes without complication except for the wrap which she states was so tight that he had to remove it it was causing a lot of discomfort. Fortunately there is no evidence of infection at this point. He states he only wore the wrap until about the time he got home last week and that he had to take it off. Nonetheless he does not have any other compression stockings, Juxta-Lite wraps, or anything otherwise to really help at this point as far as compression is concerned. 05/01/18 on evaluation today patient actually appears to be doing fairly well in regard to his lower extremity ulcer. He has been tolerating the dressing changes currently without complication. The wrap does seem to be helping as far as the fluid management is concerned. Wound bed itself actually show signs of good improvement although there is some Slough noted there's not as much as previous and he actually has fairly decent granulation noted as well. Overall I'm pleased with the progress of to this point. The patient would prefer not to have any debridement today he states he's actually not feeling too well in general and he really does not want any additional discomfort typically debridement is fairly uncomfortable for him. 05/08/18 on evaluation today patient actually appears to be doing a little better in regard to his lower extremity ulcer. He has been tolerating the dressing changes without complication. With that being said I'm actually quite pleased with the fact the wound is not appear to be infected and in general has made a little progress. With that being said I do believe we need to perform some debridement to clean away the necrotic tissue on the surface of the wound. 05/15/18 on evaluation today patient actually appears to be doing a little better in regard to his wound. Fortunately there is some improvement noted fortunately there's also no significant  evidence of infection at this point in time. He does have continued pain that really nothing different than previous he did get his Juxta-Lite wrap. 05/29/18 on evaluation today patient actually appears to be doing somewhat better in regard to his wound currently. He's been tolerating the dressing changes without complication. With that being said he has been performing the [facility] Dressing changes every day at home. I'm pretty sure that we told him every other day last week or when I saw him rather two weeks ago. With that being said obviously did not hurt to do it daily just that obviously is gonna run out of supplies sooner and that could get him into trouble as far as his insurance is concerned. Nonetheless he at this point still continues to have pain although honestly I don't feel like it's as bad as what it was in the past just based on his reactions at this point. 06/12/18 on evaluation today patient actually appears to be doing better in regard to his lower [name] ulcer. He's been tolerating the dressing changes without complication. Fortunately there does not appear to be any evidence of infection at this time. No fevers, chills, nausea, or vomiting noted at this time.  06/19/18 evaluation today patient's ulcer on the lower extremity appears to be doing better at this point. he has been tolerating the dressing changes. The patient seems to be somewhat depressed about the progress of his wound although the overall appearance at this time seems to be doing well. In general I'm very pleased with the appearance today he does have some biofilm on the surface of the wound as well as of hyper granular tissue we may want to utilize a little bit SEATON, OAKLEY ([id number]) [id number] Page 2 of 11 of silver nitrate today. 06/27/2018; patient comes into clinic today for a wound on the right lateral calf likely related to chronic venous insufficiency. He has been  using medihoney covered with Hydrofera Blue. Wound surface actually looks quite good 07/03/2018 seen today for follow-up and management of lower extremity ulcer right lateral shin. T olerating dressing changes. He has a small amount of biofilm today. Will attempt to remove a portion of the biofilm as he tolerates. He becomes very anxious with wound treatments. He would benefit from layer compression wraps however he refuses compression wraps or anything that smokes his legs. He expressed an inability to tolerate compression wraps. Juxta lite wraps have been ordered. He has been instructed on the appropriate application of the juxta leg wraps. His blood pressure today on visit was 200/120. He denies any blurred vision, chest pain, dizziness, shortness of breath, or difficulty with mobility. Mr. Timmers stated that he had a recent visit with his primary care provider. At that time his carvedilol was decreased from 25 mg daily to 12.5 mg daily. Strongly encouraged Mr. Litterio to seek medical attention today during visit. He declined transport for evaluation of elevated blood pressure. Encouraged him to contact his primary care provider today for medication management. He stated that he would call his primary care doctor after wound visit today. Also encouraged him that if he experienced any symptoms of blurred vision, chest pain, shortness of breath to immediately seek medical attention. 07/17/18 on evaluation today patient's wound actually does seem to show signs of improvement based on the overall appearance of the wound bed today. There does not appear to show any signs of infection which is good news. There is no overall worsening which is also good news. He still has hyper granular tissue will use in the [employer] followed by [employer] Dressing this point. 07/31/18 on evaluation today patient's wound bed actually show signs of improvement with good epithelialization especially in the upper portion  of the wound. He has been tolerating the dressing changes without complication in general. Overall I'm extremely happy with how things stand. No fevers, chills, nausea, or vomiting noted at this time. 08/28/18 on evaluation today patient appears to be doing a little better in regard to his lower extremity ulcer. With that being said it appears to be very hyper granular I think this is directly attributed to the fact that he continues to use the [employer] underneath the [facility] Dressing. Although this helps not to stick unfortunately also think he's not getting the benefit of the [facility] Dressing particular and subsequently is causing too much moisture buildup hence the hyper granulation. Fortunately there does not appear to be any signs of infection at this time which is good news. 09/03/18 on evaluation today patient appears to be doing well in regard to his lower [name] ulcer. He has been tolerating the dressing changes without complication. Fortunately he has much less hyper granular tissue the noted last  week I do think using silver nitrate in changing to using the [facility] Dressing without the mepitel has made a big difference for him this is good news. 09/18/18 on evaluation today patient appears to be doing excellent in regard to his right lower [name] ulcer. This is actually significantly smaller even compared to the last evaluation. Overall I'm very pleased in this regard. I'm a recommend that we likely repeat the silver nitrate today based on what I'm seeing. 10/02/18 on evaluation today patient appears to be doing excellent in regard to his lower extremity wound on the right. He's been tolerating the dressing changes without complication. Fortunately there's no signs of infection at this time. Overall been very pleased with how things seem to be progressing. No fevers, chills, nausea, or vomiting noted at this time. 10/16/18 on evaluation today patient actually appears to be  doing much better in regard to his lower extremity wound on the right. This is shown signs of good improvement and is indeed measuring smaller he is on a excellent track as far as healing is concerned. My hope is this will be healed of the next several weeks. Fortunately there is no evidence of infection at this time. He does seem to be doing everything that I'm recommending for him 10/30/18 on evaluation today patient appears to be doing better in regard to his lower extremity ulcer. He's been tolerating the dressing changes without complication. Fortunately the Hydrofera Blue Dressing to be doing the job. He has made excellent progress. With that being said this is still going somewhat slow but nonetheless is always making good progress at this point. 5/18-Patient was in to be seen for right leg ulcer that appeared after scab above it was removed today. This area had healed completely. It appears that no further action is required on this READMISSION 01/02/2022 This is a now 67 year old male with poorly controlled type 2 diabetes mellitus and chronic venous insufficiency who once again was performing lawn care when a rock was flung up by his weedeater and it struck him in the right lateral lower leg. The wound has not healed though it has been nearly 2 months since the injury occurred. He was seen in the emergency department at [facility] on May 9. At that time it was very painful and he described it as a "15 on a scale of 010". He also was found to have 3+ pitting edema to the bilateral lower extremities. He was prescribed a weeks course of Keflex. He is here today because the wound has failed to heal and he has a prior history in our clinic. ABI in clinic today was noncompressible. He did have formal vascular studies performed in 2019 which were normal. The last hemoglobin A1c I have available for review was from March 21, 2021 and was elevated at 7.7%. The wound is fairly small and circular  located about 3 inches above his right lateral malleolus. It is completely covered with eschar. No surrounding erythema, no odor, no purulent drainage. 01/11/2022: The wound on his right lateral leg is a little bit smaller today. It has reaccumulated some slough and a small bit of eschar. It remains fairly painful. He has another area on his anterior tibia that he will not let me touch but I am concerned that there is a wound forming underneath the surface. 01/18/2022: Unfortunately, his wound is a little bit bigger today. He continues to accumulate slough on the wound surface. It is still quite tender. 01/25/2022: The  patient bumped his wound on the car door which resulted in bleeding. He was seen at urgent care where it was redressed. Unfortunately, the wound is bigger again today. There is accumulated slough on the surface. Urgent care gave him some tramadol, apparently and he took this prior to his visit so he could tolerate debridement better. 02/02/2022: The wound is a little bit smaller today. The surface, however, has accumulated a fairly thick layer of slough. The periwound skin is intact and there is no obvious sign of infection. 02/07/2022: The wound is a bit larger today. It has thick slough accumulation. The periwound skin is intact and there is no obvious erythema, induration, nor any odor. 02/16/2022: I took a culture last week due to the appearance of the wound and the patient's degree of pain. This grew out methicillin sensitive Staph aureus. Augmentin was prescribed. The patient states that the pain is markedly improved today. The wound also looks much better. It is smaller with less slough buildup and there is good granulation tissue emerging. 02/21/2022: The patient's pain is quite improved from prior visits. The wound is about the same size and has some accumulated slough on the surface. The base is a little bit fibrotic but there are some areas of granulation tissue filling  in. 02/28/2022: The wound is a little bit narrower on its measurements but slightly longer. There is still some slough accumulation on the surface, but he has minimal pain and there is no concern for ongoing infection. Granulation tissue is emerging. 03/07/2022: During the week, he cut holes in his wrap because it was painful and too tight. As a result, his leg was quite a bit more swollen today and his wound was larger. The wound surface is fairly clean but a little bit dry. Minimal slough accumulation. Granulation tissue present. 03/14/2022: Once again, he did some arts and crafts projects with his wrap. On the other hand, however, his wound does look better. It is smaller today and is beginning to fill in. Minimal slough and a small amount of eschar accumulation. Anthony Barnes, Anthony Barnes ([id number]) [id number] Page 3 of 11 03/27/2022: His wound measured bigger today, although it does not appear to be so on my impression. The wound continues to fill with granulation tissue. There is a little bit of slough and eschar accumulation. He continues to cut his wrap off of his feet and his edema control at the ankle is particularly poor with 3+ pitting edema. His insurance will not cover any sort of skin substitute unfortunately. 04/04/2022: The wound is smaller today. It is more superficial with good granulation tissue and just a little bit of slough. He continues to cut away portions of his wrap which results in inadequate edema control. 04/11/2022: His wound measured a couple millimeters larger today. It continues to fill with granulation tissue and there is minimal slough on the surface. 04/19/2022: No significant change in the wound dimensions, but the surface is healthier and less fibrotic. 04/26/2022: The wound is a little bit bigger again today. There is slough accumulation on the surface. Edema control is inadequate. He has been cutting the foot portion of his wrap off each  week. 05/03/2022: The wound is a little smaller this week. There is still a fair amount of slough on the surface. His drainage was a little bit as greenish today, although not the blue-green typically associated with Pseudomonas. No significant odor. The underlying granulation tissue appears healthy. The culture that I took last week grew out methicillin  sensitive Staph aureus. 05/10/2022: The wound is again a little bit smaller this week. He still has slough accumulation on the surface. Most of the surface has fairly decent granulation tissue, but there is still a fibrotic portion in the center near the caudal aspect of the wound. He did not pick up the Augmentin that I prescribed for his MSSA infection. He says he will get it today. 05/19/2022: His wound is smaller by half a centimeter today. There is a little bit of slough buildup, but less so than at previous visits. He has not been taking his Augmentin properly. He has been splitting the tablets in half and taking them on an irregular schedule. He says this is because it has been upsetting his stomach. 05/25/2022: His wound is a little bit smaller again this week. Very minimal slough accumulation. The wound is drier than I would like to see. He completed his course of Augmentin. 10/19; patient has a wound on the right lateral lower leg in the setting of obvious chronic venous insufficiency. He has had a previous wound on the anterior leg that we dealt with several years ago. He has been using Hydrofera Blue. Kerlix and Coban 06/08/2022: The wound is smaller again this week. There is some light slough accumulation. It remains a little dry. 06/16/2022: Although the wound dimensions were the same this week, on visual inspection it appears smaller. It is clean, but still dry despite the use of hydrogel. 06/22/2022: His wound responded very well to endoform. It is smaller, moisture balance is excellent, and the surface has lovely granulation  tissue. 06/29/2022: His wound is smaller again this week. The granulation tissue is a little bit hypertrophic. No concern for infection. 07/13/2022: His wound continues to improve. The open area is much smaller, but there is still some hypertrophic granulation tissue present. 07/21/2022: The wound is covered by a thick dark scab. Once this was removed, the majority of the wound was found to be healed, with just a couple of open areas with some slough on them. 12/14The patient comes in today with the area still scabbed over. He would not let me touch these. I explained the problem from a providers point of view about scabs on the wounds including the possibility of deep underlying wounds, superficial wounds that have not healed. There is a course of the possibility that everything is epithelialized under scab. He said he understood this but did not want these further debrided this was initially trauma in the setting of chronic venous insufficiency and lymphedema READMISSION 06/25/2023 This is a now 67 year old man who has had a wound on his left mid tibial area for about 2 months. He thinks this may have happened from hitting his leg on a bed frame although that history is less than certain. He has not been keeping a specific dressing on the wound simply keeping it clean. We had him in clinic in 2023 and before that on 2020 with traumatic wounds on the right leg. He was discharged with stockings although he has not been wearing these currently. Past medical history includes type 2 diabetes with peripheral neuropathy, chronic venous insufficiency, lymphedema and hypertension. ABI in our clinic was 1.3. Patient had arterial studies in August 2023 at which time on the left his ABI was 1.03 TBI of 0.78 with triphasic waveforms 12/5; area in the left mid tibia. Initially trauma in the setting of chronic venous insufficiency and lymphedema. We put him in [place] with an early Urgo K2 compression when  he was  seen for the first time on 11/11. He said to weekly nurse visits since then. Electronic Signature(s) Signed: 07/19/2023 5:41:20 PM By: Baltazar Najjar MD Entered By: Baltazar Najjar on 07/19/2023 10:08:20 -------------------------------------------------------------------------------- Chemical Cauterization Details Patient Name: Date of Service: Anthony Barnes, Anthony [name] 07/19/2023 8:45 A M Medical Record Number: [id number] Patient Account Number: [id number] Date of Birth/Sex: Treating RN: 1956/03/25 (67 y.o. M) Primary Care Provider: Hoy Register Other Clinician: Referring Provider: Treating Provider/Extender: Leotis Pain in Treatment: 3 FISCHER, BOASE ([id number]) [id number] Page 4 of 11 Procedure Performed for: Wound #3 Left,Anterior Lower Leg Performed By: Physician Maxwell Caul., MD The following information was scribed by: Zenaida Deed The information was scribed for: Baltazar Najjar Post Procedure Diagnosis Same as Pre-procedure Notes using silver nitrate stick Electronic Signature(s) Signed: 07/19/2023 5:41:20 PM By: Baltazar Najjar MD Entered By: Baltazar Najjar on 07/19/2023 10:06:49 -------------------------------------------------------------------------------- Physical Exam Details Patient Name: Date of Service: Anthony Barnes, Anthony [name] 07/19/2023 8:45 A M Medical Record Number: [id number] Patient Account Number: [id number] Date of Birth/Sex: Treating RN: 12-Jun-1956 (67 y.o. M) Primary Care Provider: Hoy Register Other Clinician: Referring Provider: Treating Provider/Extender: Josephine Cables Weeks in Treatment: 3 Constitutional Sitting or standing Blood Pressure is within target range for patient.. Pulse regular and within target range for patient.Marland Kitchen Respirations regular, non-labored and within target range.. Temperature is normal and within the target range for the patient.Marland Kitchen Appears in no  distress. Notes Wound exam; the areas on the left anterior mid tibia. Generally a small circular wound. Granulation looks healthy here however there is hypergranulation. I used a curette to remove some of the hypergranulation. On the inferior aspect of this there is a rim of epithelialization. Electronic Signature(s) Signed: 07/19/2023 5:41:20 PM By: Baltazar Najjar MD Entered By: Baltazar Najjar on 07/19/2023 10:09:02 -------------------------------------------------------------------------------- Physician Orders Details Patient Name: Date of Service: Anthony Barnes, Anthony [name] 07/19/2023 8:45 A M Medical Record Number: [id number] Patient Account Number: [id number] Date of Birth/Sex: Treating RN: [birth year] (67 y.o. [name] Primary Care Provider: Hoy Register Other Clinician: Referring Provider: Treating Provider/Extender: Josephine Cables Weeks in Treatment: 3 The following information was scribed by: Zenaida Deed The information was scribed for: Baltazar Najjar Verbal / Phone Orders: No Diagnosis Coding ICD-10 Coding Code Description L97.828 Non-pressure chronic ulcer of other part of left lower leg with other specified severity S80.812D Abrasion, left lower leg, subsequent encounter E11.622 Type 2 diabetes mellitus with other skin ulcer Anthony Barnes, Anthony Barnes ([id number]) [id number] Page 5 of 11 I87.312 Chronic venous hypertension (idiopathic) with ulcer of left lower extremity Follow-up Appointments ppointment in 2 weeks. - Dr. Leanord Hawking Return A Nurse Visit: - 1 week Anesthetic Wound #3 Left,Anterior Lower Leg (In clinic) Topical Lidocaine 4% applied to wound bed Bathing/ Shower/ Hygiene May shower with protection but do not get wound dressing(s) wet. Protect dressing(s) with water repellant cover (for example, large plastic bag) or a cast cover and may then take shower. Edema Control - Orders / Instructions Left Lower  Extremity Elevate legs to the level of the heart or above for 30 minutes daily and/or when sitting for 3-4 times a day throughout the day. - prop feet up when sitting Avoid standing for long periods of time. Exercise regularly Compression stocking or Garment 20-30 mm/Hg pressure to: - right leg daily Wound Treatment Wound #3 - Lower Leg Wound Laterality: Left, Anterior Cleanser: Soap and Water 1 x Per Day/15 Days Discharge Instructions: May shower  and wash wound with dial antibacterial soap and water prior to dressing change. Cleanser: Vashe 5.8 (oz) 1 x Per Day/15 Days Discharge Instructions: Cleanse the wound with Vashe prior to applying a clean dressing using gauze sponges, not tissue or cotton balls. Peri-Wound Care: Triamcinolone 15 (g) 1 x Per Day/15 Days Discharge Instructions: Use triamcinolone 15 (g) as directed Peri-Wound Care: Zinc Oxide Ointment 30g tube 1 x Per Day/15 Days Discharge Instructions: Apply Zinc Oxide to periwound with each dressing change Peri-Wound Care: Sween Lotion (Moisturizing lotion) 1 x Per Day/15 Days Discharge Instructions: Apply moisturizing lotion as directed Topical: Skintegrity Hydrogel 4 (oz) 1 x Per Day/15 Days Discharge Instructions: Apply hydrogel as directed Prim Dressing: Hydrofera Blue Ready Transfer Foam, 2.5x2.5 (in/in) 1 x Per Day/15 Days ary Discharge Instructions: Apply directly to wound bed as directed Secondary Dressing: Woven Gauze Sponge, Non-Sterile 4x4 in 1 x Per Day/15 Days Discharge Instructions: Apply over primary dressing as directed. Secondary Dressing: Zetuvit Plus 4x8 in 1 x Per Day/15 Days Discharge Instructions: Apply over primary dressing as directed. Compression Wrap: Urgo K2, (equivalent to a 4 layer) two layer compression system, regular 1 x Per Day/15 Days Discharge Instructions: Apply Urgo K2 as directed (alternative to 4 layer compression). Electronic Signature(s) Signed: 07/19/2023 4:48:36 PM By: Zenaida Deed  RN, BSN Signed: 07/19/2023 5:41:20 PM By: Baltazar Najjar MD Entered By: Zenaida Deed on 07/19/2023 10:00:17 -------------------------------------------------------------------------------- Problem List Details Patient Name: Date of Service: Anthony Barnes, Anthony [name] 07/19/2023 8:45 A M Medical Record Number: [id number] Patient Account Number: [id number] Date of Birth/Sex: Treating RN: 04-20-1956 (67 y.o. [name] Primary Care Provider: Hoy Register Other Clinician: Referring Provider: Treating Provider/Extender: Leotis Pain in Treatment: 3 Anthony Barnes, Anthony Barnes ([id number]) [id number] Page 6 of 11 Active Problems ICD-10 Encounter Code Description Active Date MDM Diagnosis L97.828 Non-pressure chronic ulcer of other part of left lower leg with other specified 06/25/2023 No Yes severity S80.812D Abrasion, left lower leg, subsequent encounter 06/25/2023 No Yes E11.622 Type 2 diabetes mellitus with other skin ulcer 06/25/2023 No Yes I87.312 Chronic venous hypertension (idiopathic) with ulcer of left lower extremity 06/25/2023 No Yes Inactive Problems Resolved Problems Electronic Signature(s) Signed: 07/19/2023 5:41:20 PM By: Baltazar Najjar MD Entered By: Baltazar Najjar on 07/19/2023 10:06:35 -------------------------------------------------------------------------------- Progress Note Details Patient Name: Date of Service: Anthony Barnes, Anthony [name] 07/19/2023 8:45 A M Medical Record Number: [id number] Patient Account Number: [id number] Date of Birth/Sex: Treating RN: 06-Jun-1956 (67 y.o. M) Primary Care Provider: Hoy Register Other Clinician: Referring Provider: Treating Provider/Extender: Luster Landsberg, Odette Horns Weeks in Treatment: 3 Subjective History of Present Illness (HPI) ADMISSION 03/12/18 This is a 67 year old and it was a type II diabetic reasonably poorly controlled with a recent hemoglobin A1c of 8.2.  He was tending to his lawn on 01/15/18 when his weed Anthony Barnes sent a rock up word hitting him in the right anterior leg. He was seen in his primary M.D. office is same time. An x-ray showed no abnormality. He was given a course of cephalexin. Since then he's been applying peroxide and topical antibiotics to the wound. He does not have a history of chronic wounds however he does have chronic edema in the lower legs. He is complaining of pain in the right leg wound but no real history of claudication. Past medical history includes type 2 diabetes, hypertension, morbid obesity. ABIs in the right leg were noncompressible 03/19/18 on evaluation today patient appears to be tolerating the wrap in general fairly well.  He states he's not having that much pain in regard to the wound itself. With that being said he is having some issues with slough buildup on the surface of the wound the Iodoflex does seem to be helpful in that regard. He is still having discomfort he wonders if I can send in a prescription for ibuprofen or something of like to help him out. I do believe that the prox end may be of benefit for him. 03/25/18 on evaluation today patient appears to be doing very well in regard to his right lateral lower [name]. He's been tolerating the dressing changes without complication that is the wraps. Nonetheless he does continue to have pain he states is been dreading the possibility of having to have debridement again today. His first debridement experience was not optimal. With that being said he does seem to be showing signs of improvement there does appear to be little bit more granulation he does have a lot of slough that really does need to breeding away. 04/04/18;the patient went for arterial studies that were really quite normal. ABI on the right at 1.19 with triphasic waveforms on the left 1.11 with triphasic waveforms. TBI 0.93 on the right and 0.95 on the left. This suggests T should  be able to tolerate even 4 layer compression. He is however complaining of a lot of pain with our wraps I'm wondering whether the pain is simply Iodoflex. I changed him to [employer] I'm still going to try to keep him in 3 layer compression 04/12/18 on evaluation today patient actually appears to be doing rather well in regard to his right lower [name] ulcer. He is making good progress although it is Anthony Barnes, Anthony Barnes ([id number]) [id number] Page 7 of 11 slow still I do believe that the Santyl is much better than the Iodoflex. The good news is the patient seems to be tolerating the dressing changes without complication now that we've gotten good of the Iodoflex which really did burn him quite significantly. Otherwise there's no evidence of infection. 04/17/18 on evaluation today patient actually appears to be showing signs of improvement in regard to the ulcer. Unfortunately he's had somewhat of a rough day due to the fact that he found out that one of his friends suddenly and unexpectedly passed today. He states he's not sure he's in the frame of mind to allow me to debride the wound at this point. Nonetheless I do feel like he is showing signs of the wound getting better little by little each week. 04/24/18 on evaluation today patient's ulcer actually appears to be showing some signs of improvement albeit slow. He has been tolerating the dressing changes without complication except for the wrap which she states was so tight that he had to remove it it was causing a lot of discomfort. Fortunately there is no evidence of infection at this point. He states he only wore the wrap until about the time he got home last week and that he had to take it off. Nonetheless he does not have any other compression stockings, Juxta-Lite wraps, or anything otherwise to really help at this point as far as compression is concerned. 05/01/18 on evaluation today patient  actually appears to be doing fairly well in regard to his lower extremity ulcer. He has been tolerating the dressing changes currently without complication. The wrap does seem to be helping as far as the fluid management is concerned. Wound bed itself actually show signs of good  improvement although there is some Slough noted there's not as much as previous and he actually has fairly decent granulation noted as well. Overall I'm pleased with the progress of to this point. The patient would prefer not to have any debridement today he states he's actually not feeling too well in general and he really does not want any additional discomfort typically debridement is fairly uncomfortable for him. 05/08/18 on evaluation today patient actually appears to be doing a little better in regard to his lower extremity ulcer. He has been tolerating the dressing changes without complication. With that being said I'm actually quite pleased with the fact the wound is not appear to be infected and in general has made a little progress. With that being said I do believe we need to perform some debridement to clean away the necrotic tissue on the surface of the wound. 05/15/18 on evaluation today patient actually appears to be doing a little better in regard to his wound. Fortunately there is some improvement noted fortunately there's also no significant evidence of infection at this point in time. He does have continued pain that really nothing different than previous he did get his Juxta-Lite wrap. 05/29/18 on evaluation today patient actually appears to be doing somewhat better in regard to his wound currently. He's been tolerating the dressing changes without complication. With that being said he has been performing the [facility] Dressing changes every day at home. I'm pretty sure that we told him every other day last week or when I saw him rather two weeks ago. With that being said obviously did not hurt to do it  daily just that obviously is gonna run out of supplies sooner and that could get him into trouble as far as his insurance is concerned. Nonetheless he at this point still continues to have pain although honestly I don't feel like it's as bad as what it was in the past just based on his reactions at this point. 06/12/18 on evaluation today patient actually appears to be doing better in regard to his lower [name] ulcer. He's been tolerating the dressing changes without complication. Fortunately there does not appear to be any evidence of infection at this time. No fevers, chills, nausea, or vomiting noted at this time. 06/19/18 evaluation today patient's ulcer on the lower extremity appears to be doing better at this point. he has been tolerating the dressing changes. The patient seems to be somewhat depressed about the progress of his wound although the overall appearance at this time seems to be doing well. In general I'm very pleased with the appearance today he does have some biofilm on the surface of the wound as well as of hyper granular tissue we may want to utilize a little bit of silver nitrate today. 06/27/2018; patient comes into clinic today for a wound on the right lateral calf likely related to chronic venous insufficiency. He has been using medihoney covered with Hydrofera Blue. Wound surface actually looks quite good 07/03/2018 seen today for follow-up and management of lower extremity ulcer right lateral shin. T olerating dressing changes. He has a small amount of biofilm today. Will attempt to remove a portion of the biofilm as he tolerates. He becomes very anxious with wound treatments. He would benefit from layer compression wraps however he refuses compression wraps or anything that smokes his legs. He expressed an inability to tolerate compression wraps. Juxta lite wraps have been ordered. He has been instructed on the appropriate application of the juxta  leg wraps. His blood  pressure today on visit was 200/120. He denies any blurred vision, chest pain, dizziness, shortness of breath, or difficulty with mobility. Mr. Retterer stated that he had a recent visit with his primary care provider. At that time his carvedilol was decreased from 25 mg daily to 12.5 mg daily. Strongly encouraged Mr. Zamor to seek medical attention today during visit. He declined transport for evaluation of elevated blood pressure. Encouraged him to contact his primary care provider today for medication management. He stated that he would call his primary care doctor after wound visit today. Also encouraged him that if he experienced any symptoms of blurred vision, chest pain, shortness of breath to immediately seek medical attention. 07/17/18 on evaluation today patient's wound actually does seem to show signs of improvement based on the overall appearance of the wound bed today. There does not appear to show any signs of infection which is good news. There is no overall worsening which is also good news. He still has hyper granular tissue will use in the [employer] followed by [facility] Dressing this point. 07/31/18 on evaluation today patient's wound bed actually show signs of improvement with good epithelialization especially in the upper portion of the wound. He has been tolerating the dressing changes without complication in general. Overall I'm extremely happy with how things stand. No fevers, chills, nausea, or vomiting noted at this time. 08/28/18 on evaluation today patient appears to be doing a little better in regard to his lower extremity ulcer. With that being said it appears to be very hyper granular I think this is directly attributed to the fact that he continues to use the [employer] underneath the [facility] Dressing. Although this helps not to stick unfortunately also think he's not getting the benefit of the [facility] Dressing particular and subsequently is causing too much  moisture buildup hence the hyper granulation. Fortunately there does not appear to be any signs of infection at this time which is good news. 09/03/18 on evaluation today patient appears to be doing well in regard to his lower [name] ulcer. He has been tolerating the dressing changes without complication. Fortunately he has much less hyper granular tissue the noted last week I do think using silver nitrate in changing to using the [facility] Dressing without the mepitel has made a big difference for him this is good news. 09/18/18 on evaluation today patient appears to be doing excellent in regard to his right lower [name] ulcer. This is actually significantly smaller even compared to the last evaluation. Overall I'm very pleased in this regard. I'm a recommend that we likely repeat the silver nitrate today based on what I'm seeing. 10/02/18 on evaluation today patient appears to be doing excellent in regard to his lower extremity wound on the right. He's been tolerating the dressing changes without complication. Fortunately there's no signs of infection at this time. Overall been very pleased with how things seem to be progressing. No fevers, chills, nausea, or vomiting noted at this time. 10/16/18 on evaluation today patient actually appears to be doing much better in regard to his lower extremity wound on the right. This is shown signs of good improvement and is indeed measuring smaller he is on a excellent track as far as healing is concerned. My hope is this will be healed of the next several weeks. Fortunately there is no evidence of infection at this time. He does seem to be doing everything that I'm recommending for him 10/30/18  on evaluation today patient appears to be doing better in regard to his lower extremity ulcer. He's been tolerating the dressing changes without complication. Fortunately the Hydrofera Blue Dressing to be doing the job. He has made excellent progress. With that being  said this is still going somewhat slow but nonetheless is always making good progress at this point. 5/18-Patient was in to be seen for right leg ulcer that appeared after scab above it was removed today. This area had healed completely. It appears that no further action is required on this READMISSION 01/02/2022 This is a now 67 year old male with poorly controlled type 2 diabetes mellitus and chronic venous insufficiency who once again was performing lawn care when a rock was flung up by his weedeater and it struck him in the right lateral lower leg. The wound has not healed though it has been nearly 2 months since the injury occurred. He was seen in the emergency department at [facility] on May 9. At that time it was very painful and he described it as a "15 on a scale Anthony Barnes, Anthony Barnes ([id number]) [id number] Page 8 of 11 of 010". He also was found to have 3+ pitting edema to the bilateral lower extremities. He was prescribed a weeks course of Keflex. He is here today because the wound has failed to heal and he has a prior history in our clinic. ABI in clinic today was noncompressible. He did have formal vascular studies performed in 2019 which were normal. The last hemoglobin A1c I have available for review was from March 21, 2021 and was elevated at 7.7%. The wound is fairly small and circular located about 3 inches above his right lateral malleolus. It is completely covered with eschar. No surrounding erythema, no odor, no purulent drainage. 01/11/2022: The wound on his right lateral leg is a little bit smaller today. It has reaccumulated some slough and a small bit of eschar. It remains fairly painful. He has another area on his anterior tibia that he will not let me touch but I am concerned that there is a wound forming underneath the surface. 01/18/2022: Unfortunately, his wound is a little bit bigger today. He continues to accumulate slough on the wound surface.  It is still quite tender. 01/25/2022: The patient bumped his wound on the car door which resulted in bleeding. He was seen at urgent care where it was redressed. Unfortunately, the wound is bigger again today. There is accumulated slough on the surface. Urgent care gave him some tramadol, apparently and he took this prior to his visit so he could tolerate debridement better. 02/02/2022: The wound is a little bit smaller today. The surface, however, has accumulated a fairly thick layer of slough. The periwound skin is intact and there is no obvious sign of infection. 02/07/2022: The wound is a bit larger today. It has thick slough accumulation. The periwound skin is intact and there is no obvious erythema, induration, nor any odor. 02/16/2022: I took a culture last week due to the appearance of the wound and the patient's degree of pain. This grew out methicillin sensitive Staph aureus. Augmentin was prescribed. The patient states that the pain is markedly improved today. The wound also looks much better. It is smaller with less slough buildup and there is good granulation tissue emerging. 02/21/2022: The patient's pain is quite improved from prior visits. The wound is about the same size and has some accumulated slough on the surface. The base is a little bit fibrotic but  there are some areas of granulation tissue filling in. 02/28/2022: The wound is a little bit narrower on its measurements but slightly longer. There is still some slough accumulation on the surface, but he has minimal pain and there is no concern for ongoing infection. Granulation tissue is emerging. 03/07/2022: During the week, he cut holes in his wrap because it was painful and too tight. As a result, his leg was quite a bit more swollen today and his wound was larger. The wound surface is fairly clean but a little bit dry. Minimal slough accumulation. Granulation tissue present. 03/14/2022: Once again, he did some arts and crafts projects  with his wrap. On the other hand, however, his wound does look better. It is smaller today and is beginning to fill in. Minimal slough and a small amount of eschar accumulation. 03/27/2022: His wound measured bigger today, although it does not appear to be so on my impression. The wound continues to fill with granulation tissue. There is a little bit of slough and eschar accumulation. He continues to cut his wrap off of his feet and his edema control at the ankle is particularly poor with 3+ pitting edema. His insurance will not cover any sort of skin substitute unfortunately. 04/04/2022: The wound is smaller today. It is more superficial with good granulation tissue and just a little bit of slough. He continues to cut away portions of his wrap which results in inadequate edema control. 04/11/2022: His wound measured a couple millimeters larger today. It continues to fill with granulation tissue and there is minimal slough on the surface. 04/19/2022: No significant change in the wound dimensions, but the surface is healthier and less fibrotic. 04/26/2022: The wound is a little bit bigger again today. There is slough accumulation on the surface. Edema control is inadequate. He has been cutting the foot portion of his wrap off each week. 05/03/2022: The wound is a little smaller this week. There is still a fair amount of slough on the surface. His drainage was a little bit as greenish today, although not the blue-green typically associated with Pseudomonas. No significant odor. The underlying granulation tissue appears healthy. The culture that I took last week grew out methicillin sensitive Staph aureus. 05/10/2022: The wound is again a little bit smaller this week. He still has slough accumulation on the surface. Most of the surface has fairly decent granulation tissue, but there is still a fibrotic portion in the center near the caudal aspect of the wound. He did not pick up the Augmentin that I prescribed  for his MSSA infection. He says he will get it today. 05/19/2022: His wound is smaller by half a centimeter today. There is a little bit of slough buildup, but less so than at previous visits. He has not been taking his Augmentin properly. He has been splitting the tablets in half and taking them on an irregular schedule. He says this is because it has been upsetting his stomach. 05/25/2022: His wound is a little bit smaller again this week. Very minimal slough accumulation. The wound is drier than I would like to see. He completed his course of Augmentin. 10/19; patient has a wound on the right lateral lower leg in the setting of obvious chronic venous insufficiency. He has had a previous wound on the anterior leg that we dealt with several years ago. He has been using Hydrofera Blue. Kerlix and Coban 06/08/2022: The wound is smaller again this week. There is some light slough accumulation. It remains  a little dry. 06/16/2022: Although the wound dimensions were the same this week, on visual inspection it appears smaller. It is clean, but still dry despite the use of hydrogel. 06/22/2022: His wound responded very well to endoform. It is smaller, moisture balance is excellent, and the surface has lovely granulation tissue. 06/29/2022: His wound is smaller again this week. The granulation tissue is a little bit hypertrophic. No concern for infection. 07/13/2022: His wound continues to improve. The open area is much smaller, but there is still some hypertrophic granulation tissue present. 07/21/2022: The wound is covered by a thick dark scab. Once this was removed, the majority of the wound was found to be healed, with just a couple of open areas with some slough on them. 12/14The patient comes in today with the area still scabbed over. He would not let me touch these. I explained the problem from a providers point of view about scabs on the wounds including the possibility of deep underlying wounds,  superficial wounds that have not healed. There is a course of the possibility that everything is epithelialized under scab. He said he understood this but did not want these further debrided this was initially trauma in the setting of chronic venous insufficiency and lymphedema READMISSION 06/25/2023 Anthony Barnes, Anthony Barnes ([id number]) [id number] Page 9 of 11 This is a now 67 year old man who has had a wound on his left mid tibial area for about 2 months. He thinks this may have happened from hitting his leg on a bed frame although that history is less than certain. He has not been keeping a specific dressing on the wound simply keeping it clean. We had him in clinic in 2023 and before that on 2020 with traumatic wounds on the right leg. He was discharged with stockings although he has not been wearing these currently. Past medical history includes type 2 diabetes with peripheral neuropathy, chronic venous insufficiency, lymphedema and hypertension. ABI in our clinic was 1.3. Patient had arterial studies in August 2023 at which time on the left his ABI was 1.03 TBI of 0.78 with triphasic waveforms 12/5; area in the left mid tibia. Initially trauma in the setting of chronic venous insufficiency and lymphedema. We put him in [place] with an early Urgo K2 compression when he was seen for the first time on 11/11. He said to weekly nurse visits since then. Objective Constitutional Sitting or standing Blood Pressure is within target range for patient.. Pulse regular and within target range for patient.Marland Kitchen Respirations regular, non-labored and within target range.. Temperature is normal and within the target range for the patient.Marland Kitchen Appears in no distress. Vitals Time Taken: 9:34 AM, Temperature: 98.5 F, Pulse: 65 bpm, Respiratory Rate: 18 breaths/min, Blood Pressure: 141/84 mmHg. General Notes: Wound exam; the areas on the left anterior mid tibia. Generally a small circular wound.  Granulation looks healthy here however there is hypergranulation. I used a curette to remove some of the hypergranulation. On the inferior aspect of this there is a rim of epithelialization. Integumentary (Hair, Skin) Wound #3 status is Open. Original cause of wound was Trauma. The date acquired was: 04/17/2023. The wound has been in treatment 3 weeks. The wound is located on the Left,Anterior Lower Leg. The wound measures 1.8cm length x 1.6cm width x 0.1cm depth; 2.262cm^2 area and 0.226cm^3 volume. There is Fat Layer (Subcutaneous Tissue) exposed. There is no tunneling or undermining noted. There is a medium amount of serosanguineous drainage noted. The wound margin is flat and intact. There is  large (67-100%) red granulation within the wound bed. There is a small (1-33%) amount of necrotic tissue within the wound bed including Adherent Slough. The periwound skin appearance had no abnormalities noted for texture. The periwound skin appearance had no abnormalities noted for moisture. The periwound skin appearance exhibited: Hemosiderin Staining. Periwound temperature was noted as No Abnormality. The periwound has tenderness on palpation. Assessment Active Problems ICD-10 Non-pressure chronic ulcer of other part of left lower leg with other specified severity Abrasion, left lower leg, subsequent encounter Type 2 diabetes mellitus with other skin ulcer Chronic venous hypertension (idiopathic) with ulcer of left lower extremity Procedures Wound #3 Pre-procedure diagnosis of Wound #3 is an Abrasion located on the Left,Anterior Lower Leg . There was a Double Layer Compression Therapy Procedure by Zenaida Deed, RN. Post procedure Diagnosis Wound #3: Same as Pre-Procedure Notes: Urgo. Pre-procedure diagnosis of Wound #3 is an Abrasion located on the Left,Anterior Lower Leg . An Chemical Cauterization procedure was performed by Maxwell Caul., MD. Post procedure Diagnosis Wound #3: Same as  Pre-Procedure Notes: using silver nitrate stick Plan Follow-up Appointments: Return Appointment in 2 weeks. - Dr. Leanord Hawking Nurse Visit: - 1 week Anesthetic: Wound #3 Left,Anterior Lower Leg: (In clinic) Topical Lidocaine 4% applied to wound bed Bathing/ Shower/ Hygiene: May shower with protection but do not get wound dressing(s) wet. Protect dressing(s) with water repellant cover (for example, large plastic bag) or a cast cover Anthony Barnes, Anthony Barnes ([id number]) [id number] Page 10 of 11 and may then take shower. Edema Control - Orders / Instructions: Elevate legs to the level of the heart or above for 30 minutes daily and/or when sitting for 3-4 times a day throughout the day. - prop feet up when sitting Avoid standing for long periods of time. Exercise regularly Compression stocking or Garment 20-30 mm/Hg pressure to: - right leg daily WOUND #3: - Lower Leg Wound Laterality: Left, Anterior Cleanser: Soap and Water 1 x Per Day/15 Days Discharge Instructions: May shower and wash wound with dial antibacterial soap and water prior to dressing change. Cleanser: Vashe 5.8 (oz) 1 x Per Day/15 Days Discharge Instructions: Cleanse the wound with Vashe prior to applying a clean dressing using gauze sponges, not tissue or cotton balls. Peri-Wound Care: Triamcinolone 15 (g) 1 x Per Day/15 Days Discharge Instructions: Use triamcinolone 15 (g) as directed Peri-Wound Care: Zinc Oxide Ointment 30g tube 1 x Per Day/15 Days Discharge Instructions: Apply Zinc Oxide to periwound with each dressing change Peri-Wound Care: Sween Lotion (Moisturizing lotion) 1 x Per Day/15 Days Discharge Instructions: Apply moisturizing lotion as directed Topical: Skintegrity Hydrogel 4 (oz) 1 x Per Day/15 Days Discharge Instructions: Apply hydrogel as directed Prim Dressing: Hydrofera Blue Ready Transfer Foam, 2.5x2.5 (in/in) 1 x Per Day/15 Days ary Discharge Instructions: Apply directly to wound  bed as directed Secondary Dressing: Woven Gauze Sponge, Non-Sterile 4x4 in 1 x Per Day/15 Days Discharge Instructions: Apply over primary dressing as directed. Secondary Dressing: Zetuvit Plus 4x8 in 1 x Per Day/15 Days Discharge Instructions: Apply over primary dressing as directed. Com pression Wrap: Urgo K2, (equivalent to a 4 layer) two layer compression system, regular 1 x Per Day/15 Days Discharge Instructions: Apply Urgo K2 as directed (alternative to 4 layer compression). 1. I used chemical cauterization on the wound to knock back some of the hyper granulated tissue. 2. Will change from [facility] to [facility] 3. Still under an Urgo K2 compression 4. Nurse visit next week follow-up with physician visit in 2 weeks Electronic  Signature(s) Signed: 07/19/2023 5:41:20 PM By: Baltazar Najjar MD Entered By: Baltazar Najjar on 07/19/2023 10:10:27 -------------------------------------------------------------------------------- SuperBill Details Patient Name: Date of Service: Anthony Barnes, Anthony [name] 07/19/2023 Medical Record Number: [id number] Patient Account Number: [id number] Date of Birth/Sex: Treating RN: 11-19-1955 (67 y.o. M) Primary Care Provider: Hoy Register Other Clinician: Referring Provider: Treating Provider/Extender: Luster Landsberg, Odette Horns Weeks in Treatment: 3 Diagnosis Coding ICD-10 Codes Code Description [phone] Non-pressure chronic ulcer of other part of left lower leg with other specified severity S80.812D Abrasion, left lower leg, subsequent encounter E11.622 Type 2 diabetes mellitus with other skin ulcer I87.312 Chronic venous hypertension (idiopathic) with ulcer of left lower extremity Facility Procedures : CPT4 Code: 32440102 Description: 17250 - CHEM CAUT GRANULATION TISS ICD-10 Diagnosis Description L97.828 Non-pressure chronic ulcer of other part of left lower leg with other specified sever S80.812D Abrasion, left lower leg, subsequent encounter  I87.312 Chronic venous  hypertension (idiopathic) with ulcer of left lower extremity Modifier: ity Quantity: 1 : CPT4 Code: 72536644 Description: (Facility Use Only) 29581LT - APPLY MULTLAY COMPRS LWR LT LEG Modifier: 59 Quantity: 1 Physician Procedures OMA, SKUFCA ([id number]): CPT4 Code Description [phone] 17250 - WC PHYS CHEM CAUT GRAN TISSUE ICD-10 Diagnosis Description L97.828 Non-pressure chronic ulcer of other part of left lower leg with ot S80.812D Abrasion, left lower leg, subsequent  encounter I87.312 Chronic venous hypertension (idiopathic) with ulcer of left lower [id number] Page 11 of 11: Quantity Modifier 1 her specified severity extremity Electronic Signature(s) Signed: 07/19/2023 4:48:36 PM By: Zenaida Deed RN, BSN Signed: 07/19/2023 5:41:20 PM By: Baltazar Najjar MD Entered By: Zenaida Deed on 07/19/2023 10:12:26

## 2023-07-25 ENCOUNTER — Encounter (HOSPITAL_BASED_OUTPATIENT_CLINIC_OR_DEPARTMENT_OTHER): Payer: No Typology Code available for payment source | Admitting: Internal Medicine

## 2023-07-25 DIAGNOSIS — E11622 Type 2 diabetes mellitus with other skin ulcer: Secondary | ICD-10-CM | POA: Diagnosis not present

## 2023-07-26 NOTE — Progress Notes (Signed)
IRA, YOUNGHANS ([id number]) [id number] Page 1 of 1 Visit Report for 07/25/2023 SuperBill Details Patient Name: Date of Service: Anthony Barnes 07/25/2023 Medical Record Number: [id number] Patient Account Number: [id number] Date of Birth/Sex: Treating RN: 16-Aug-1955 (67 y.o. M) Primary Care Provider: Hoy Register Other Clinician: Referring Provider: Treating Provider/Extender: Luster Landsberg, Odette Horns Weeks in Treatment: 4 Diagnosis Coding ICD-10 Codes Code Description [phone] Non-pressure chronic ulcer of other part of left lower leg with other specified severity S80.812D Abrasion, left lower leg, subsequent encounter E11.622 Type 2 diabetes mellitus with other skin ulcer I87.312 Chronic venous hypertension (idiopathic) with ulcer of left lower extremity Facility Procedures CPT4 Code Description Modifier Quantity [id number] (Facility Use Only) [phone] - APPLY MULTLAY COMPRS LWR LT LEG 1 Electronic Signature(s) Signed: 07/25/2023 5:06:36 PM By: Thayer Dallas Signed: 07/25/2023 5:18:28 PM By: Baltazar Najjar MD Entered By: Thayer Dallas on 07/25/2023 06:23:49

## 2023-07-26 NOTE — Progress Notes (Signed)
YAHIA, GOHN ([id number]) [id number] Page 1 of 3 Visit Report for 07/25/2023 Arrival Information Details Patient Name: Date of Service: Anthony Barnes. 07/25/2023 8:45 A M Medical Record Number: [id number] Patient Account Number: [id number] Date of Birth/Sex: Treating RN: Apr 21, 1956 (67 y.o. M) Primary Care Kash Mothershead: Hoy Register Other Clinician: Referring Hasaan Radde: Treating Lawson Mahone/Extender: Leotis Pain in Treatment: 4 Visit Information History Since Last Visit Added or deleted any medications: No Patient Arrived: Ambulatory Any new allergies or adverse reactions: No Arrival Time: 09:00 Had a fall or experienced change in No Accompanied By: self activities of daily living that may affect Transfer Assistance: None risk of falls: Patient Identification Verified: Yes Signs or symptoms of abuse/neglect since last visito No Secondary Verification Process Completed: Yes Hospitalized since last visit: No Patient Requires Transmission-Based Precautions: No Implantable device outside of the clinic excluding No Patient Has Alerts: No cellular tissue based products placed in the center since last visit: Has Dressing in Place as Prescribed: Yes Has Compression in Place as Prescribed: Yes Pain Present Now: No Electronic Signature(s) Signed: 07/25/2023 5:06:36 PM By: Thayer Dallas Entered By: Thayer Dallas on 07/25/2023 06:00:45 -------------------------------------------------------------------------------- Encounter Discharge Information Details Patient Name: Date of Service: Anthony Barnes, Anthony [name] 07/25/2023 8:45 A M Medical Record Number: [id number] Patient Account Number: [id number] Date of Birth/Sex: Treating RN: [birth year] (67 y.o. M) Primary Care Marce Charlesworth: Hoy Register Other Clinician: Thayer Dallas Referring Hanalei Glace: Treating Chenoah Mcnally/Extender: Leotis Pain in Treatment:  4 Encounter Discharge Information Items Discharge Condition: Stable Ambulatory Status: Ambulatory Discharge Destination: Home Transportation: Private Auto Accompanied By: self Schedule Follow-up Appointment: Yes Clinical Summary of Care: Electronic Signature(s) Signed: 07/25/2023 5:06:36 PM By: Thayer Dallas Entered By: Thayer Dallas on 07/25/2023 06:23:39 Anthony Barnes ([id number]) [id number] Page 2 of 3 -------------------------------------------------------------------------------- Patient/Caregiver Education Details Patient Name: Date of Service: Anthony Barnes 12/11/2024andnbsp8:45 A M Medical Record Number: [id number] Patient Account Number: [id number] Date of Birth/Gender: Treating RN: [birth year] (67 y.o. M) Primary Care Physician: Hoy Register Other Clinician: Thayer Dallas Referring Physician: Treating Physician/Extender: Leotis Pain in Treatment: 4 Education Assessment Education Provided To: Patient Education Topics Provided Electronic Signature(s) Signed: 07/25/2023 5:06:36 PM By: Thayer Dallas Entered By: Thayer Dallas on 07/25/2023 16:60:63 -------------------------------------------------------------------------------- Wound Assessment Details Patient Name: Date of Service: Anthony Barnes, Anthony [name] 07/25/2023 8:45 A M Medical Record Number: [id number] Patient Account Number: [id number] Date of Birth/Sex: Treating RN: 08-Nov-1955 (67 y.o. M) Primary Care Verda Mehta: Hoy Register Other Clinician: Referring Muna Demers: Treating Uzziah Rigg/Extender: Josephine Cables Weeks in Treatment: 4 Wound Status Wound Number: 3 Primary Etiology: Abrasion Wound Location: Left, Anterior Lower Leg Secondary Etiology: Diabetic Wound/Ulcer of the Lower Extremity Wounding Event: Trauma Wound Status: Open Date Acquired: 04/17/2023 Weeks Of Treatment: 4 Clustered Wound: No Wound  Measurements Length: (cm) 1.8 Width: (cm) 1.6 Depth: (cm) 0.1 Area: (cm) 2.262 Volume: (cm) 0.226 % Reduction in Area: -140.1% % Reduction in Volume: -140.4% Wound Description Classification: Full Thickness Without Exposed Support Exudate Amount: Medium Exudate Type: Serosanguineous Exudate Color: red, brown Structures Periwound Skin Texture Texture Color No Abnormalities Noted: No No Abnormalities Noted: No Moisture No Abnormalities Noted: No Treatment Notes Anthony Barnes, Anthony Barnes ([id number]) [id number] Page 3 of 3 Wound #3 (Lower Leg) Wound Laterality: Left, Anterior Cleanser Soap and Water Discharge Instruction: May shower and wash wound with dial antibacterial soap and water prior to dressing change. Vashe 5.8 (oz) Discharge Instruction: Cleanse the wound with Vashe prior to applying  a clean dressing using gauze sponges, not tissue or cotton balls. Peri-Wound Care Triamcinolone 15 (g) Discharge Instruction: Use triamcinolone 15 (g) as directed Zinc Oxide Ointment 30g tube Discharge Instruction: Apply Zinc Oxide to periwound with each dressing change Sween Lotion (Moisturizing lotion) Discharge Instruction: Apply moisturizing lotion as directed Topical Skintegrity Hydrogel 4 (oz) Discharge Instruction: Apply hydrogel as directed Primary Dressing Hydrofera Blue Ready Transfer Foam, 2.5x2.5 (in/in) Discharge Instruction: Apply directly to wound bed as directed Secondary Dressing Woven Gauze Sponge, Non-Sterile 4x4 in Discharge Instruction: Apply over primary dressing as directed. Zetuvit Plus 4x8 in Discharge Instruction: Apply over primary dressing as directed. Secured With Compression Wrap Urgo K2, (equivalent to a 4 layer) two layer compression system, regular Discharge Instruction: Apply Urgo K2 as directed (alternative to 4 layer compression). Compression Stockings Add-Ons Electronic Signature(s) Signed: 07/25/2023 5:06:36 PM By: Thayer Dallas Entered By: Thayer Dallas on 07/25/2023 06:01:04 -------------------------------------------------------------------------------- Vitals Details Patient Name: Date of Service: Anthony Barnes, Anthony [name] 07/25/2023 8:45 A M Medical Record Number: [id number] Patient Account Number: [id number] Date of Birth/Sex: Treating RN: 10/22/1955 (67 y.o. M) Primary Care Agnes Probert: Hoy Register Other Clinician: Referring Kairen Hallinan: Treating Larz Mark/Extender: Luster Landsberg, Odette Horns Weeks in Treatment: 4 Vital Signs Time Taken: 09:00 Reference Range: 80 - 120 mg / dl Electronic Signature(s) Signed: 07/25/2023 5:06:36 PM By: Thayer Dallas Entered By: Thayer Dallas on 07/25/2023 06:00:54

## 2023-07-27 ENCOUNTER — Telehealth: Payer: Self-pay | Admitting: *Deleted

## 2023-07-27 NOTE — Telephone Encounter (Signed)
Chart reviewed and [employer] verification completed.   Chart review highlights and potential barriers to colorectal cancer screening (CRC) screening: Per patient's health maintenance, patient had colonoscopy on 12/07/14, no results in [facility], and is due for next screening on 12/06/24.  Patient 's most recent GI referral to [facility] was closed on 05/29/15 because referral was expired.  Patient has seen Dr. Danise Edge at [facility] in the past.   Patient had GI referral placed on 12/07/14 and it is currently closed.  Per 06/25/17 office visit note, patient had declined colonoscopy.   Telephone call to patient, no answer, no voicemail available, unable to leave a message. Health Equity Program Manager (HEPM) will make additional outreach attempts to patient by 08/03/23.  HEPM will also follow up with Quality Performance Specialist [facility] Cairrikier Stover) to verify patient's health maintenance colonoscopy due date and last screening date.     Paras Kreider H. Docia Furl, Charity fundraiser, [facility] [phone]

## 2023-08-02 ENCOUNTER — Encounter (HOSPITAL_BASED_OUTPATIENT_CLINIC_OR_DEPARTMENT_OTHER): Payer: No Typology Code available for payment source | Admitting: Internal Medicine

## 2023-08-02 DIAGNOSIS — S81802A Unspecified open wound, left lower leg, initial encounter: Secondary | ICD-10-CM | POA: Diagnosis not present

## 2023-08-02 DIAGNOSIS — E11622 Type 2 diabetes mellitus with other skin ulcer: Secondary | ICD-10-CM | POA: Diagnosis not present

## 2023-08-03 DIAGNOSIS — L97828 Non-pressure chronic ulcer of other part of left lower leg with other specified severity: Secondary | ICD-10-CM | POA: Diagnosis not present

## 2023-08-03 DIAGNOSIS — E11622 Type 2 diabetes mellitus with other skin ulcer: Secondary | ICD-10-CM | POA: Diagnosis not present

## 2023-08-03 DIAGNOSIS — S80812D Abrasion, left lower leg, subsequent encounter: Secondary | ICD-10-CM | POA: Diagnosis not present

## 2023-08-03 DIAGNOSIS — I87312 Chronic venous hypertension (idiopathic) with ulcer of left lower extremity: Secondary | ICD-10-CM | POA: Diagnosis not present

## 2023-08-03 NOTE — Progress Notes (Signed)
Anthony Barnes, Anthony Barnes ([id number]) [id number] Page 1 of 10 Visit Report for 08/02/2023 HPI Details Patient Name: Date of Service: Anthony Hark [name] 08/02/2023 8:45 A M Medical Record Number: [id number] Patient Account Number: [id number] Date of Birth/Sex: Treating RN: 1955-10-05 (67 y.o. M) Primary Care Provider: Hoy Register Other Clinician: Referring Provider: Treating Provider/Extender: Josephine Cables Weeks in Treatment: 5 History of Present Illness HPI Description: ADMISSION 03/12/18 This is a 67 year old and it was a type II diabetic reasonably poorly controlled with a recent hemoglobin A1c of 8.2. He was tending to his lawn on 01/15/18 when his weed Anthony Barnes sent a rock up word hitting him in the right anterior leg. He was seen in his primary M.D. office is same time. An x-ray showed no abnormality. He was given a course of cephalexin. Since then he's been applying peroxide and topical antibiotics to the wound. He does not have a history of chronic wounds however he does have chronic edema in the lower legs. He is complaining of pain in the right leg wound but no real history of claudication. Past medical history includes type 2 diabetes, hypertension, morbid obesity. ABIs in the right leg were noncompressible 03/19/18 on evaluation today patient appears to be tolerating the wrap in general fairly well. He states he's not having that much pain in regard to the wound itself. With that being said he is having some issues with slough buildup on the surface of the wound the Iodoflex does seem to be helpful in that regard. He is still having discomfort he wonders if I can send in a prescription for ibuprofen or something of like to help him out. I do believe that the prox end may be of benefit for him. 03/25/18 on evaluation today patient appears to be doing very well in regard to his right lateral lower [name]. He's been  tolerating the dressing changes without complication that is the wraps. Nonetheless he does continue to have pain he states is been dreading the possibility of having to have debridement again today. His first debridement experience was not optimal. With that being said he does seem to be showing signs of improvement there does appear to be little bit more granulation he does have a lot of slough that really does need to breeding away. 04/04/18;the patient went for arterial studies that were really quite normal. ABI on the right at 1.19 with triphasic waveforms on the left 1.11 with triphasic waveforms. TBI 0.93 on the right and 0.95 on the left. This suggests T should be able to tolerate even 4 layer compression. He is however complaining of a lot of pain with our wraps I'm wondering whether the pain is simply Iodoflex. I changed him to [employer] I'm still going to try to keep him in 3 layer compression 04/12/18 on evaluation today patient actually appears to be doing rather well in regard to his right lower [name] ulcer. He is making good progress although it is slow still I do believe that the Santyl is much better than the Iodoflex. The good news is the patient seems to be tolerating the dressing changes without complication now that we've gotten good of the Iodoflex which really did burn him quite significantly. Otherwise there's no evidence of infection. 04/17/18 on evaluation today patient actually appears to be showing signs of improvement in regard to the ulcer. Unfortunately he's had somewhat of a rough day due to the fact that he found out that  one of his friends suddenly and unexpectedly passed today. He states he's not sure he's in the frame of mind to allow me to debride the wound at this point. Nonetheless I do feel like he is showing signs of the wound getting better little by little each week. 04/24/18 on evaluation today patient's ulcer actually appears to be showing  some signs of improvement albeit slow. He has been tolerating the dressing changes without complication except for the wrap which she states was so tight that he had to remove it it was causing a lot of discomfort. Fortunately there is no evidence of infection at this point. He states he only wore the wrap until about the time he got home last week and that he had to take it off. Nonetheless he does not have any other compression stockings, Juxta-Lite wraps, or anything otherwise to really help at this point as far as compression is concerned. 05/01/18 on evaluation today patient actually appears to be doing fairly well in regard to his lower extremity ulcer. He has been tolerating the dressing changes currently without complication. The wrap does seem to be helping as far as the fluid management is concerned. Wound bed itself actually show signs of good improvement although there is some Slough noted there's not as much as previous and he actually has fairly decent granulation noted as well. Overall I'm pleased with the progress of to this point. The patient would prefer not to have any debridement today he states he's actually not feeling too well in general and he really does not want any additional discomfort typically debridement is fairly uncomfortable for him. 05/08/18 on evaluation today patient actually appears to be doing a little better in regard to his lower extremity ulcer. He has been tolerating the dressing changes without complication. With that being said I'm actually quite pleased with the fact the wound is not appear to be infected and in general has made a little progress. With that being said I do believe we need to perform some debridement to clean away the necrotic tissue on the surface of the wound. 05/15/18 on evaluation today patient actually appears to be doing a little better in regard to his wound. Fortunately there is some improvement noted fortunately there's also no  significant evidence of infection at this point in time. He does have continued pain that really nothing different than previous he did get his Juxta-Lite wrap. 05/29/18 on evaluation today patient actually appears to be doing somewhat better in regard to his wound currently. He's been tolerating the dressing changes without complication. With that being said he has been performing the [facility] Dressing changes every day at home. I'm pretty sure that we told him every other day last week or when I saw him rather two weeks ago. With that being said obviously did not hurt to do it daily just that obviously is gonna run out of supplies sooner and that could get him into trouble as far as his insurance is concerned. Nonetheless he at this point still continues to have pain although honestly I don't feel like it's as bad as what it was in the past just based on his reactions at this point. 06/12/18 on evaluation today patient actually appears to be doing better in regard to his lower [name] ulcer. He's been tolerating the dressing changes without complication. Fortunately there does not appear to be any evidence of infection at this time. No fevers, chills, nausea, or vomiting noted at this time.  06/19/18 evaluation today patient's ulcer on the lower extremity appears to be doing better at this point. he has been tolerating the dressing changes. The patient seems to be somewhat depressed about the progress of his wound although the overall appearance at this time seems to be doing well. In general I'm very pleased with the appearance today he does have some biofilm on the surface of the wound as well as of hyper granular tissue we may want to utilize a little bit Anthony Barnes, Anthony Barnes ([id number]) [id number] Page 2 of 10 of silver nitrate today. 06/27/2018; patient comes into clinic today for a wound on the right lateral calf likely related to chronic venous insufficiency. He  has been using medihoney covered with Hydrofera Blue. Wound surface actually looks quite good 07/03/2018 seen today for follow-up and management of lower extremity ulcer right lateral shin. T olerating dressing changes. He has a small amount of biofilm today. Will attempt to remove a portion of the biofilm as he tolerates. He becomes very anxious with wound treatments. He would benefit from layer compression wraps however he refuses compression wraps or anything that smokes his legs. He expressed an inability to tolerate compression wraps. Juxta lite wraps have been ordered. He has been instructed on the appropriate application of the juxta leg wraps. His blood pressure today on visit was 200/120. He denies any blurred vision, chest pain, dizziness, shortness of breath, or difficulty with mobility. Anthony Barnes stated that he had a recent visit with his primary care provider. At that time his carvedilol was decreased from 25 mg daily to 12.5 mg daily. Strongly encouraged Anthony Barnes to seek medical attention today during visit. He declined transport for evaluation of elevated blood pressure. Encouraged him to contact his primary care provider today for medication management. He stated that he would call his primary care doctor after wound visit today. Also encouraged him that if he experienced any symptoms of blurred vision, chest pain, shortness of breath to immediately seek medical attention. 07/17/18 on evaluation today patient's wound actually does seem to show signs of improvement based on the overall appearance of the wound bed today. There does not appear to show any signs of infection which is good news. There is no overall worsening which is also good news. He still has hyper granular tissue will use in the [employer] followed by [facility] Dressing this point. 07/31/18 on evaluation today patient's wound bed actually show signs of improvement with good epithelialization especially in the upper  portion of the wound. He has been tolerating the dressing changes without complication in general. Overall I'm extremely happy with how things stand. No fevers, chills, nausea, or vomiting noted at this time. 08/28/18 on evaluation today patient appears to be doing a little better in regard to his lower extremity ulcer. With that being said it appears to be very hyper granular I think this is directly attributed to the fact that he continues to use the [employer] underneath the [facility] Dressing. Although this helps not to stick unfortunately also think he's not getting the benefit of the [facility] Dressing particular and subsequently is causing too much moisture buildup hence the hyper granulation. Fortunately there does not appear to be any signs of infection at this time which is good news. 09/03/18 on evaluation today patient appears to be doing well in regard to his lower [name] ulcer. He has been tolerating the dressing changes without complication. Fortunately he has much less hyper granular tissue the noted last  week I do think using silver nitrate in changing to using the [facility] Dressing without the mepitel has made a big difference for him this is good news. 09/18/18 on evaluation today patient appears to be doing excellent in regard to his right lower [name] ulcer. This is actually significantly smaller even compared to the last evaluation. Overall I'm very pleased in this regard. I'm a recommend that we likely repeat the silver nitrate today based on what I'm seeing. 10/02/18 on evaluation today patient appears to be doing excellent in regard to his lower extremity wound on the right. He's been tolerating the dressing changes without complication. Fortunately there's no signs of infection at this time. Overall been very pleased with how things seem to be progressing. No fevers, chills, nausea, or vomiting noted at this time. 10/16/18 on evaluation today patient actually appears  to be doing much better in regard to his lower extremity wound on the right. This is shown signs of good improvement and is indeed measuring smaller he is on a excellent track as far as healing is concerned. My hope is this will be healed of the next several weeks. Fortunately there is no evidence of infection at this time. He does seem to be doing everything that I'm recommending for him 10/30/18 on evaluation today patient appears to be doing better in regard to his lower extremity ulcer. He's been tolerating the dressing changes without complication. Fortunately the Hydrofera Blue Dressing to be doing the job. He has made excellent progress. With that being said this is still going somewhat slow but nonetheless is always making good progress at this point. 5/18-Patient was in to be seen for right leg ulcer that appeared after scab above it was removed today. This area had healed completely. It appears that no further action is required on this READMISSION 01/02/2022 This is a now 67 year old male with poorly controlled type 2 diabetes mellitus and chronic venous insufficiency who once again was performing lawn care when a rock was flung up by his weedeater and it struck him in the right lateral lower leg. The wound has not healed though it has been nearly 2 months since the injury occurred. He was seen in the emergency department at [facility] on May 9. At that time it was very painful and he described it as a "15 on a scale of 010". He also was found to have 3+ pitting edema to the bilateral lower extremities. He was prescribed a weeks course of Keflex. He is here today because the wound has failed to heal and he has a prior history in our clinic. ABI in clinic today was noncompressible. He did have formal vascular studies performed in 2019 which were normal. The last hemoglobin A1c I have available for review was from March 21, 2021 and was elevated at 7.7%. The wound is fairly small and circular  located about 3 inches above his right lateral malleolus. It is completely covered with eschar. No surrounding erythema, no odor, no purulent drainage. 01/11/2022: The wound on his right lateral leg is a little bit smaller today. It has reaccumulated some slough and a small bit of eschar. It remains fairly painful. He has another area on his anterior tibia that he will not let me touch but I am concerned that there is a wound forming underneath the surface. 01/18/2022: Unfortunately, his wound is a little bit bigger today. He continues to accumulate slough on the wound surface. It is still quite tender. 01/25/2022: The  patient bumped his wound on the car door which resulted in bleeding. He was seen at urgent care where it was redressed. Unfortunately, the wound is bigger again today. There is accumulated slough on the surface. Urgent care gave him some tramadol, apparently and he took this prior to his visit so he could tolerate debridement better. 02/02/2022: The wound is a little bit smaller today. The surface, however, has accumulated a fairly thick layer of slough. The periwound skin is intact and there is no obvious sign of infection. 02/07/2022: The wound is a bit larger today. It has thick slough accumulation. The periwound skin is intact and there is no obvious erythema, induration, nor any odor. 02/16/2022: I took a culture last week due to the appearance of the wound and the patient's degree of pain. This grew out methicillin sensitive Staph aureus. Augmentin was prescribed. The patient states that the pain is markedly improved today. The wound also looks much better. It is smaller with less slough buildup and there is good granulation tissue emerging. 02/21/2022: The patient's pain is quite improved from prior visits. The wound is about the same size and has some accumulated slough on the surface. The base is a little bit fibrotic but there are some areas of granulation tissue filling  in. 02/28/2022: The wound is a little bit narrower on its measurements but slightly longer. There is still some slough accumulation on the surface, but he has minimal pain and there is no concern for ongoing infection. Granulation tissue is emerging. 03/07/2022: During the week, he cut holes in his wrap because it was painful and too tight. As a result, his leg was quite a bit more swollen today and his wound was larger. The wound surface is fairly clean but a little bit dry. Minimal slough accumulation. Granulation tissue present. 03/14/2022: Once again, he did some arts and crafts projects with his wrap. On the other hand, however, his wound does look better. It is smaller today and is beginning to fill in. Minimal slough and a small amount of eschar accumulation. Anthony Barnes, Anthony Barnes ([id number]) [id number] Page 3 of 10 03/27/2022: His wound measured bigger today, although it does not appear to be so on my impression. The wound continues to fill with granulation tissue. There is a little bit of slough and eschar accumulation. He continues to cut his wrap off of his feet and his edema control at the ankle is particularly poor with 3+ pitting edema. His insurance will not cover any sort of skin substitute unfortunately. 04/04/2022: The wound is smaller today. It is more superficial with good granulation tissue and just a little bit of slough. He continues to cut away portions of his wrap which results in inadequate edema control. 04/11/2022: His wound measured a couple millimeters larger today. It continues to fill with granulation tissue and there is minimal slough on the surface. 04/19/2022: No significant change in the wound dimensions, but the surface is healthier and less fibrotic. 04/26/2022: The wound is a little bit bigger again today. There is slough accumulation on the surface. Edema control is inadequate. He has been cutting the foot portion of his wrap off each  week. 05/03/2022: The wound is a little smaller this week. There is still a fair amount of slough on the surface. His drainage was a little bit as greenish today, although not the blue-green typically associated with Pseudomonas. No significant odor. The underlying granulation tissue appears healthy. The culture that I took last week grew out methicillin  sensitive Staph aureus. 05/10/2022: The wound is again a little bit smaller this week. He still has slough accumulation on the surface. Most of the surface has fairly decent granulation tissue, but there is still a fibrotic portion in the center near the caudal aspect of the wound. He did not pick up the Augmentin that I prescribed for his MSSA infection. He says he will get it today. 05/19/2022: His wound is smaller by half a centimeter today. There is a little bit of slough buildup, but less so than at previous visits. He has not been taking his Augmentin properly. He has been splitting the tablets in half and taking them on an irregular schedule. He says this is because it has been upsetting his stomach. 05/25/2022: His wound is a little bit smaller again this week. Very minimal slough accumulation. The wound is drier than I would like to see. He completed his course of Augmentin. 10/19; patient has a wound on the right lateral lower leg in the setting of obvious chronic venous insufficiency. He has had a previous wound on the anterior leg that we dealt with several years ago. He has been using Hydrofera Blue. Kerlix and Coban 06/08/2022: The wound is smaller again this week. There is some light slough accumulation. It remains a little dry. 06/16/2022: Although the wound dimensions were the same this week, on visual inspection it appears smaller. It is clean, but still dry despite the use of hydrogel. 06/22/2022: His wound responded very well to endoform. It is smaller, moisture balance is excellent, and the surface has lovely granulation  tissue. 06/29/2022: His wound is smaller again this week. The granulation tissue is a little bit hypertrophic. No concern for infection. 07/13/2022: His wound continues to improve. The open area is much smaller, but there is still some hypertrophic granulation tissue present. 07/21/2022: The wound is covered by a thick dark scab. Once this was removed, the majority of the wound was found to be healed, with just a couple of open areas with some slough on them. 12/14The patient comes in today with the area still scabbed over. He would not let me touch these. I explained the problem from a providers point of view about scabs on the wounds including the possibility of deep underlying wounds, superficial wounds that have not healed. There is a course of the possibility that everything is epithelialized under scab. He said he understood this but did not want these further debrided this was initially trauma in the setting of chronic venous insufficiency and lymphedema READMISSION 06/25/2023 This is a now 67 year old man who has had a wound on his left mid tibial area for about 2 months. He thinks this may have happened from hitting his leg on a bed frame although that history is less than certain. He has not been keeping a specific dressing on the wound simply keeping it clean. We had him in clinic in 2023 and before that on 2020 with traumatic wounds on the right leg. He was discharged with stockings although he has not been wearing these currently. Past medical history includes type 2 diabetes with peripheral neuropathy, chronic venous insufficiency, lymphedema and hypertension. ABI in our clinic was 1.3. Patient had arterial studies in August 2023 at which time on the left his ABI was 1.03 TBI of 0.78 with triphasic waveforms 12/5; area in the left mid tibia. Initially trauma in the setting of chronic venous insufficiency and lymphedema. We put him in [place] with an early Urgo K2 compression when  he was  seen for the first time on 11/11. He said to weekly nurse visits since then. 12/19; left mid tibia initially trauma in the setting of chronic venous insufficiency and lymphedema. He had hypergranulation last week I therefore changed the dressing to [facility]. He comes in today with a healthy looking wound bed and rims of epithelialization. Electronic Signature(s) Signed: 08/02/2023 5:18:32 PM By: Baltazar Najjar MD Entered By: Baltazar Najjar on 08/02/2023 09:29:37 -------------------------------------------------------------------------------- Physical Exam Details Patient Name: Date of Service: Anthony Barnes, Anthony [name] 08/02/2023 8:45 A M Medical Record Number: [id number] Patient Account Number: [id number] Date of Birth/Sex: Treating RN: 09-19-1955 (67 y.o. M) Primary Care Provider: Hoy Register Other Clinician: Arnetha Barnes ([id number]) [id number] Page 4 of 10 Referring Provider: Treating Provider/Extender: Luster Landsberg, Odette Horns Weeks in Treatment: 5 Constitutional Patient is hypertensive.. Pulse regular and within target range for patient.Marland Kitchen Respirations regular, non-labored and within target range.. Temperature is normal and within the target range for the patient.Marland Kitchen Appears in no distress. Notes Wound exam; the area on the left anterior mid tibia. Much better looking wound surface no debridement is required. He does not have hypergranulation. Rim of epithelialization around the edges looks very healthy. We have excellent edema control in the left leg Electronic Signature(s) Signed: 08/02/2023 5:18:32 PM By: Baltazar Najjar MD Entered By: Baltazar Najjar on 08/02/2023 09:30:25 -------------------------------------------------------------------------------- Physician Orders Details Patient Name: Date of Service: Anthony Barnes, Anthony [name] 08/02/2023 8:45 A M Medical Record Number: [id number] Patient Account Number: [id number] Date of  Birth/Sex: Treating RN: 08-05-1956 (67 y.o. Harlon Flor, Millard.Loa Primary Care Provider: Hoy Register Other Clinician: Referring Provider: Treating Provider/Extender: Josephine Cables Weeks in Treatment: 5 The following information was scribed by: Shawn Stall The information was scribed for: Baltazar Najjar Verbal / Phone Orders: No Diagnosis Coding ICD-10 Coding Code Description L97.828 Non-pressure chronic ulcer of other part of left lower leg with other specified severity S80.812D Abrasion, left lower leg, subsequent encounter E11.622 Type 2 diabetes mellitus with other skin ulcer I87.312 Chronic venous hypertension (idiopathic) with ulcer of left lower extremity Follow-up Appointments Return appointment in 3 weeks. - *******Dr. Mikey Bussing******* 2nd week of January (front office to schedule) Other: - provided leg measurements and purchase over the counter compression garments 20-51mmHg. [employer] Anesthetic Wound #3 Left,Anterior Lower Leg (In clinic) Topical Lidocaine 4% applied to wound bed Bathing/ Shower/ Hygiene May shower and wash wound with soap and water. - with dressing changes. Edema Control - Orders / Instructions Left Lower Extremity Elevate legs to the level of the heart or above for 30 minutes daily and/or when sitting for 3-4 times a day throughout the day. - prop feet up when sitting Avoid standing for long periods of time. Patient to wear own compression stockings every day. Exercise regularly Compression stocking or Garment 20-30 mm/Hg pressure to: - both legs Wound Treatment Wound #3 - Lower Leg Wound Laterality: Left, Anterior Cleanser: Soap and Water Every Other Day/15 Days Discharge Instructions: May shower and wash wound with dial antibacterial soap and water prior to dressing change. Cleanser: Vashe 5.8 (oz) Every Other Day/15 Days Discharge Instructions: Cleanse the wound with Vashe prior to applying a clean dressing using gauze  sponges, not tissue or cotton balls. Peri-Wound Care: Skin Prep (DME) (Generic) Every Other Day/15 Days LYAL, SCULL ([id number]) [phone].pdf Page 5 of 10 Discharge Instructions: Use skin prep as directed Peri-Wound Care: Sween Lotion (Moisturizing lotion) Every Other Day/15 Days Discharge Instructions: Apply moisturizing lotion as  directed Prim Dressing: Hydrofera Blue Ready Transfer Foam, 2.5x2.5 (in/in) (DME) (Generic) Every Other Day/15 Days ary Discharge Instructions: Apply directly to wound bed as directed Secondary Dressing: Woven Gauze Sponges 2x2 in (DME) (Generic) Every Other Day/15 Days Discharge Instructions: Apply over primary dressing as directed. Secondary Dressing: Zetuvit Plus Silicone Border Dressing 3x3 (in/in) (DME) (Generic) Every Other Day/15 Days Discharge Instructions: Apply silicone border over primary dressing as directed. Compression Wrap: tubigrip size E double layer Every Other Day/15 Days Discharge Instructions: apply in the morning and remove at night. Electronic Signature(s) Signed: 08/02/2023 5:18:32 PM By: Baltazar Najjar MD Signed: 08/02/2023 6:09:01 PM By: Shawn Stall RN, BSN Entered By: Shawn Stall on 08/02/2023 09:17:30 -------------------------------------------------------------------------------- Problem List Details Patient Name: Date of Service: Anthony Barnes, Anthony [name] 08/02/2023 8:45 A M Medical Record Number: [id number] Patient Account Number: [id number] Date of Birth/Sex: Treating RN: Jun 20, 1956 (67 y.o. Anthony Barnes Primary Care Provider: Hoy Register Other Clinician: Referring Provider: Treating Provider/Extender: Josephine Cables Weeks in Treatment: 5 Active Problems ICD-10 Encounter Code Description Active Date MDM Diagnosis L97.828 Non-pressure chronic ulcer of other part of left lower leg with other specified 06/25/2023 No Yes severity S80.812D Abrasion, left lower leg,  subsequent encounter 06/25/2023 No Yes E11.622 Type 2 diabetes mellitus with other skin ulcer 06/25/2023 No Yes I87.312 Chronic venous hypertension (idiopathic) with ulcer of left lower extremity 06/25/2023 No Yes Inactive Problems Resolved Problems Electronic Signature(s) Signed: 08/02/2023 5:18:32 PM By: Baltazar Najjar MD Entered By: Baltazar Najjar on 08/02/2023 09:27:13 Anthony Barnes ([id number]) [id number] Page 6 of 10 -------------------------------------------------------------------------------- Progress Note Details Patient Name: Date of Service: Anthony Hark [name] 08/02/2023 8:45 A M Medical Record Number: [id number] Patient Account Number: [id number] Date of Birth/Sex: Treating RN: 03/14/1956 (67 y.o. M) Primary Care Provider: Hoy Register Other Clinician: Referring Provider: Treating Provider/Extender: Luster Landsberg, Odette Horns Weeks in Treatment: 5 Subjective History of Present Illness (HPI) ADMISSION 03/12/18 This is a 67 year old and it was a type II diabetic reasonably poorly controlled with a recent hemoglobin A1c of 8.2. He was tending to his lawn on 01/15/18 when his weed Anthony Barnes sent a rock up word hitting him in the right anterior leg. He was seen in his primary M.D. office is same time. An x-ray showed no abnormality. He was given a course of cephalexin. Since then he's been applying peroxide and topical antibiotics to the wound. He does not have a history of chronic wounds however he does have chronic edema in the lower legs. He is complaining of pain in the right leg wound but no real history of claudication. Past medical history includes type 2 diabetes, hypertension, morbid obesity. ABIs in the right leg were noncompressible 03/19/18 on evaluation today patient appears to be tolerating the wrap in general fairly well. He states he's not having that much pain in regard to the wound itself. With that being said he is having  some issues with slough buildup on the surface of the wound the Iodoflex does seem to be helpful in that regard. He is still having discomfort he wonders if I can send in a prescription for ibuprofen or something of like to help him out. I do believe that the prox end may be of benefit for him. 03/25/18 on evaluation today patient appears to be doing very well in regard to his right lateral lower [name]. He's been tolerating the dressing changes without complication that is the wraps. Nonetheless he does continue to have pain  he states is been dreading the possibility of having to have debridement again today. His first debridement experience was not optimal. With that being said he does seem to be showing signs of improvement there does appear to be little bit more granulation he does have a lot of slough that really does need to breeding away. 04/04/18;the patient went for arterial studies that were really quite normal. ABI on the right at 1.19 with triphasic waveforms on the left 1.11 with triphasic waveforms. TBI 0.93 on the right and 0.95 on the left. This suggests T should be able to tolerate even 4 layer compression. He is however complaining of a lot of pain with our wraps I'm wondering whether the pain is simply Iodoflex. I changed him to [employer] I'm still going to try to keep him in 3 layer compression 04/12/18 on evaluation today patient actually appears to be doing rather well in regard to his right lower [name] ulcer. He is making good progress although it is slow still I do believe that the Santyl is much better than the Iodoflex. The good news is the patient seems to be tolerating the dressing changes without complication now that we've gotten good of the Iodoflex which really did burn him quite significantly. Otherwise there's no evidence of infection. 04/17/18 on evaluation today patient actually appears to be showing signs of improvement in  regard to the ulcer. Unfortunately he's had somewhat of a rough day due to the fact that he found out that one of his friends suddenly and unexpectedly passed today. He states he's not sure he's in the frame of mind to allow me to debride the wound at this point. Nonetheless I do feel like he is showing signs of the wound getting better little by little each week. 04/24/18 on evaluation today patient's ulcer actually appears to be showing some signs of improvement albeit slow. He has been tolerating the dressing changes without complication except for the wrap which she states was so tight that he had to remove it it was causing a lot of discomfort. Fortunately there is no evidence of infection at this point. He states he only wore the wrap until about the time he got home last week and that he had to take it off. Nonetheless he does not have any other compression stockings, Juxta-Lite wraps, or anything otherwise to really help at this point as far as compression is concerned. 05/01/18 on evaluation today patient actually appears to be doing fairly well in regard to his lower extremity ulcer. He has been tolerating the dressing changes currently without complication. The wrap does seem to be helping as far as the fluid management is concerned. Wound bed itself actually show signs of good improvement although there is some Slough noted there's not as much as previous and he actually has fairly decent granulation noted as well. Overall I'm pleased with the progress of to this point. The patient would prefer not to have any debridement today he states he's actually not feeling too well in general and he really does not want any additional discomfort typically debridement is fairly uncomfortable for him. 05/08/18 on evaluation today patient actually appears to be doing a little better in regard to his lower extremity ulcer. He has been tolerating the dressing changes without complication. With that being said  I'm actually quite pleased with the fact the wound is not appear to be infected and in general has made a little progress. With that being said  I do believe we need to perform some debridement to clean away the necrotic tissue on the surface of the wound. 05/15/18 on evaluation today patient actually appears to be doing a little better in regard to his wound. Fortunately there is some improvement noted fortunately there's also no significant evidence of infection at this point in time. He does have continued pain that really nothing different than previous he did get his Juxta-Lite wrap. 05/29/18 on evaluation today patient actually appears to be doing somewhat better in regard to his wound currently. He's been tolerating the dressing changes without complication. With that being said he has been performing the [facility] Dressing changes every day at home. I'm pretty sure that we told him every other day last week or when I saw him rather two weeks ago. With that being said obviously did not hurt to do it daily just that obviously is gonna run out of supplies sooner and that could get him into trouble as far as his insurance is concerned. Nonetheless he at this point still continues to have pain although honestly I don't feel like it's as bad as what it was in the past just based on his reactions at this point. 06/12/18 on evaluation today patient actually appears to be doing better in regard to his lower [name] ulcer. He's been tolerating the dressing changes without complication. Fortunately there does not appear to be any evidence of infection at this time. No fevers, chills, nausea, or vomiting noted at this time. 06/19/18 evaluation today patient's ulcer on the lower extremity appears to be doing better at this point. he has been tolerating the dressing changes. The patient seems to be somewhat depressed about the progress of his wound although the overall appearance at this time seems to be  doing well. In general I'm very pleased with the appearance today he does have some biofilm on the surface of the wound as well as of hyper granular tissue we may want to utilize a little bit of silver nitrate today. 06/27/2018; patient comes into clinic today for a wound on the right lateral calf likely related to chronic venous insufficiency. He has been using medihoney covered with Hydrofera Blue. Wound surface actually looks quite good 07/03/2018 seen today for follow-up and management of lower extremity ulcer right lateral shin. Tolerating dressing changes. He has a small amount of biofilm Anthony Barnes, Anthony Barnes ([id number]) [id number] Page 7 of 10 today. Will attempt to remove a portion of the biofilm as he tolerates. He becomes very anxious with wound treatments. He would benefit from layer compression wraps however he refuses compression wraps or anything that smokes his legs. He expressed an inability to tolerate compression wraps. Juxta lite wraps have been ordered. He has been instructed on the appropriate application of the juxta leg wraps. His blood pressure today on visit was 200/120. He denies any blurred vision, chest pain, dizziness, shortness of breath, or difficulty with mobility. Anthony Barnes stated that he had a recent visit with his primary care provider. At that time his carvedilol was decreased from 25 mg daily to 12.5 mg daily. Strongly encouraged Mr. Warley to seek medical attention today during visit. He declined transport for evaluation of elevated blood pressure. Encouraged him to contact his primary care provider today for medication management. He stated that he would call his primary care doctor after wound visit today. Also encouraged him that if he experienced any symptoms of blurred vision, chest pain, shortness of breath to immediately seek medical  attention. 07/17/18 on evaluation today patient's wound actually does seem to show signs of  improvement based on the overall appearance of the wound bed today. There does not appear to show any signs of infection which is good news. There is no overall worsening which is also good news. He still has hyper granular tissue will use in the [employer] followed by [facility] Dressing this point. 07/31/18 on evaluation today patient's wound bed actually show signs of improvement with good epithelialization especially in the upper portion of the wound. He has been tolerating the dressing changes without complication in general. Overall I'm extremely happy with how things stand. No fevers, chills, nausea, or vomiting noted at this time. 08/28/18 on evaluation today patient appears to be doing a little better in regard to his lower extremity ulcer. With that being said it appears to be very hyper granular I think this is directly attributed to the fact that he continues to use the [employer] underneath the [facility] Dressing. Although this helps not to stick unfortunately also think he's not getting the benefit of the [facility] Dressing particular and subsequently is causing too much moisture buildup hence the hyper granulation. Fortunately there does not appear to be any signs of infection at this time which is good news. 09/03/18 on evaluation today patient appears to be doing well in regard to his lower [name] ulcer. He has been tolerating the dressing changes without complication. Fortunately he has much less hyper granular tissue the noted last week I do think using silver nitrate in changing to using the [facility] Dressing without the mepitel has made a big difference for him this is good news. 09/18/18 on evaluation today patient appears to be doing excellent in regard to his right lower [name] ulcer. This is actually significantly smaller even compared to the last evaluation. Overall I'm very pleased in this regard. I'm a recommend that we likely repeat the silver nitrate today  based on what I'm seeing. 10/02/18 on evaluation today patient appears to be doing excellent in regard to his lower extremity wound on the right. He's been tolerating the dressing changes without complication. Fortunately there's no signs of infection at this time. Overall been very pleased with how things seem to be progressing. No fevers, chills, nausea, or vomiting noted at this time. 10/16/18 on evaluation today patient actually appears to be doing much better in regard to his lower extremity wound on the right. This is shown signs of good improvement and is indeed measuring smaller he is on a excellent track as far as healing is concerned. My hope is this will be healed of the next several weeks. Fortunately there is no evidence of infection at this time. He does seem to be doing everything that I'm recommending for him 10/30/18 on evaluation today patient appears to be doing better in regard to his lower extremity ulcer. He's been tolerating the dressing changes without complication. Fortunately the Hydrofera Blue Dressing to be doing the job. He has made excellent progress. With that being said this is still going somewhat slow but nonetheless is always making good progress at this point. 5/18-Patient was in to be seen for right leg ulcer that appeared after scab above it was removed today. This area had healed completely. It appears that no further action is required on this READMISSION 01/02/2022 This is a now 67 year old male with poorly controlled type 2 diabetes mellitus and chronic venous insufficiency who once again was performing lawn care when a  rock was flung up by his weedeater and it struck him in the right lateral lower leg. The wound has not healed though it has been nearly 2 months since the injury occurred. He was seen in the emergency department at [facility] on May 9. At that time it was very painful and he described it as a "15 on a scale of 010". He also was found to have 3+  pitting edema to the bilateral lower extremities. He was prescribed a weeks course of Keflex. He is here today because the wound has failed to heal and he has a prior history in our clinic. ABI in clinic today was noncompressible. He did have formal vascular studies performed in 2019 which were normal. The last hemoglobin A1c I have available for review was from March 21, 2021 and was elevated at 7.7%. The wound is fairly small and circular located about 3 inches above his right lateral malleolus. It is completely covered with eschar. No surrounding erythema, no odor, no purulent drainage. 01/11/2022: The wound on his right lateral leg is a little bit smaller today. It has reaccumulated some slough and a small bit of eschar. It remains fairly painful. He has another area on his anterior tibia that he will not let me touch but I am concerned that there is a wound forming underneath the surface. 01/18/2022: Unfortunately, his wound is a little bit bigger today. He continues to accumulate slough on the wound surface. It is still quite tender. 01/25/2022: The patient bumped his wound on the car door which resulted in bleeding. He was seen at urgent care where it was redressed. Unfortunately, the wound is bigger again today. There is accumulated slough on the surface. Urgent care gave him some tramadol, apparently and he took this prior to his visit so he could tolerate debridement better. 02/02/2022: The wound is a little bit smaller today. The surface, however, has accumulated a fairly thick layer of slough. The periwound skin is intact and there is no obvious sign of infection. 02/07/2022: The wound is a bit larger today. It has thick slough accumulation. The periwound skin is intact and there is no obvious erythema, induration, nor any odor. 02/16/2022: I took a culture last week due to the appearance of the wound and the patient's degree of pain. This grew out methicillin sensitive Staph aureus. Augmentin  was prescribed. The patient states that the pain is markedly improved today. The wound also looks much better. It is smaller with less slough buildup and there is good granulation tissue emerging. 02/21/2022: The patient's pain is quite improved from prior visits. The wound is about the same size and has some accumulated slough on the surface. The base is a little bit fibrotic but there are some areas of granulation tissue filling in. 02/28/2022: The wound is a little bit narrower on its measurements but slightly longer. There is still some slough accumulation on the surface, but he has minimal pain and there is no concern for ongoing infection. Granulation tissue is emerging. 03/07/2022: During the week, he cut holes in his wrap because it was painful and too tight. As a result, his leg was quite a bit more swollen today and his wound was larger. The wound surface is fairly clean but a little bit dry. Minimal slough accumulation. Granulation tissue present. 03/14/2022: Once again, he did some arts and crafts projects with his wrap. On the other hand, however, his wound does look better. It is smaller today and is beginning  to fill in. Minimal slough and a small amount of eschar accumulation. 03/27/2022: His wound measured bigger today, although it does not appear to be so on my impression. The wound continues to fill with granulation tissue. There is a little bit of slough and eschar accumulation. He continues to cut his wrap off of his feet and his edema control at the ankle is particularly poor with 3+ pitting edema. His insurance will not cover any sort of skin substitute unfortunately. Anthony Barnes, Anthony Barnes ([id number]) [id number] Page 8 of 10 04/04/2022: The wound is smaller today. It is more superficial with good granulation tissue and just a little bit of slough. He continues to cut away portions of his wrap which results in inadequate edema control. 04/11/2022: His wound  measured a couple millimeters larger today. It continues to fill with granulation tissue and there is minimal slough on the surface. 04/19/2022: No significant change in the wound dimensions, but the surface is healthier and less fibrotic. 04/26/2022: The wound is a little bit bigger again today. There is slough accumulation on the surface. Edema control is inadequate. He has been cutting the foot portion of his wrap off each week. 05/03/2022: The wound is a little smaller this week. There is still a fair amount of slough on the surface. His drainage was a little bit as greenish today, although not the blue-green typically associated with Pseudomonas. No significant odor. The underlying granulation tissue appears healthy. The culture that I took last week grew out methicillin sensitive Staph aureus. 05/10/2022: The wound is again a little bit smaller this week. He still has slough accumulation on the surface. Most of the surface has fairly decent granulation tissue, but there is still a fibrotic portion in the center near the caudal aspect of the wound. He did not pick up the Augmentin that I prescribed for his MSSA infection. He says he will get it today. 05/19/2022: His wound is smaller by half a centimeter today. There is a little bit of slough buildup, but less so than at previous visits. He has not been taking his Augmentin properly. He has been splitting the tablets in half and taking them on an irregular schedule. He says this is because it has been upsetting his stomach. 05/25/2022: His wound is a little bit smaller again this week. Very minimal slough accumulation. The wound is drier than I would like to see. He completed his course of Augmentin. 10/19; patient has a wound on the right lateral lower leg in the setting of obvious chronic venous insufficiency. He has had a previous wound on the anterior leg that we dealt with several years ago. He has been using Hydrofera Blue. Kerlix and  Coban 06/08/2022: The wound is smaller again this week. There is some light slough accumulation. It remains a little dry. 06/16/2022: Although the wound dimensions were the same this week, on visual inspection it appears smaller. It is clean, but still dry despite the use of hydrogel. 06/22/2022: His wound responded very well to endoform. It is smaller, moisture balance is excellent, and the surface has lovely granulation tissue. 06/29/2022: His wound is smaller again this week. The granulation tissue is a little bit hypertrophic. No concern for infection. 07/13/2022: His wound continues to improve. The open area is much smaller, but there is still some hypertrophic granulation tissue present. 07/21/2022: The wound is covered by a thick dark scab. Once this was removed, the majority of the wound was found to be healed, with just a couple  of open areas with some slough on them. 12/14The patient comes in today with the area still scabbed over. He would not let me touch these. I explained the problem from a providers point of view about scabs on the wounds including the possibility of deep underlying wounds, superficial wounds that have not healed. There is a course of the possibility that everything is epithelialized under scab. He said he understood this but did not want these further debrided this was initially trauma in the setting of chronic venous insufficiency and lymphedema READMISSION 06/25/2023 This is a now 67 year old man who has had a wound on his left mid tibial area for about 2 months. He thinks this may have happened from hitting his leg on a bed frame although that history is less than certain. He has not been keeping a specific dressing on the wound simply keeping it clean. We had him in clinic in 2023 and before that on 2020 with traumatic wounds on the right leg. He was discharged with stockings although he has not been wearing these currently. Past medical history includes type 2  diabetes with peripheral neuropathy, chronic venous insufficiency, lymphedema and hypertension. ABI in our clinic was 1.3. Patient had arterial studies in August 2023 at which time on the left his ABI was 1.03 TBI of 0.78 with triphasic waveforms 12/5; area in the left mid tibia. Initially trauma in the setting of chronic venous insufficiency and lymphedema. We put him in [facility] with an early Urgo K2 compression when he was seen for the first time on 11/11. He said to weekly nurse visits since then. 12/19; left mid tibia initially trauma in the setting of chronic venous insufficiency and lymphedema. He had hypergranulation last week I therefore changed the dressing to [facility]. He comes in today with a healthy looking wound bed and rims of epithelialization. Objective Constitutional Patient is hypertensive.. Pulse regular and within target range for patient.Marland Kitchen Respirations regular, non-labored and within target range.. Temperature is normal and within the target range for the patient.Marland Kitchen Appears in no distress. Vitals Time Taken: 9:00 AM, Temperature: 98.3 F, Pulse: 67 bpm, Respiratory Rate: 20 breaths/min, Blood Pressure: 152/98 mmHg, Capillary Blood Glucose: 141 mg/dl. General Notes: Wound exam; the area on the left anterior mid tibia. Much better looking wound surface no debridement is required. He does not have hypergranulation. Rim of epithelialization around the edges looks very healthy. We have excellent edema control in the left leg Integumentary (Hair, Skin) Wound #3 status is Open. Original cause of wound was Trauma. The date acquired was: 04/17/2023. The wound has been in treatment 5 weeks. The wound is located on the Left,Anterior Lower Leg. The wound measures 1.3cm length x 0.7cm width x 0.1cm depth; 0.715cm^2 area and 0.071cm^3 volume. There is Fat Layer (Subcutaneous Tissue) exposed. There is no tunneling or undermining noted. There is a medium amount of serosanguineous drainage  noted. The wound margin is distinct with the outline attached to the wound base. There is large (67-100%) red granulation within the wound bed. There is no necrotic tissue within Anthony Barnes, Anthony Barnes ([id number]) [phone].pdf Page 9 of 10 the wound bed. The periwound skin appearance did not exhibit: Callus, Crepitus, Excoriation, Induration, Rash, Scarring, Dry/Scaly, Maceration, Atrophie Blanche, Cyanosis, Ecchymosis, Hemosiderin Staining, Mottled, Pallor, Rubor, Erythema. Periwound temperature was noted as No Abnormality. Assessment Active Problems ICD-10 Non-pressure chronic ulcer of other part of left lower leg with other specified severity Abrasion, left lower leg, subsequent encounter Type 2 diabetes mellitus with other  skin ulcer Chronic venous hypertension (idiopathic) with ulcer of left lower extremity Plan Follow-up Appointments: Return appointment in 3 weeks. - *******Dr. Mikey Bussing******* 2nd week of January (front office to schedule) Other: - provided leg measurements and purchase over the counter compression garments 20-105mmHg. [employer] Anesthetic: Wound #3 Left,Anterior Lower Leg: (In clinic) Topical Lidocaine 4% applied to wound bed Bathing/ Shower/ Hygiene: May shower and wash wound with soap and water. - with dressing changes. Edema Control - Orders / Instructions: Elevate legs to the level of the heart or above for 30 minutes daily and/or when sitting for 3-4 times a day throughout the day. - prop feet up when sitting Avoid standing for long periods of time. Patient to wear own compression stockings every day. Exercise regularly Compression stocking or Garment 20-30 mm/Hg pressure to: - both legs WOUND #3: - Lower Leg Wound Laterality: Left, Anterior Cleanser: Soap and Water Every Other Day/15 Days Discharge Instructions: May shower and wash wound with dial antibacterial soap and water prior to dressing change. Cleanser: Vashe 5.8 (oz)  Every Other Day/15 Days Discharge Instructions: Cleanse the wound with Vashe prior to applying a clean dressing using gauze sponges, not tissue or cotton balls. Peri-Wound Care: Skin Prep (DME) (Generic) Every Other Day/15 Days Discharge Instructions: Use skin prep as directed Peri-Wound Care: Sween Lotion (Moisturizing lotion) Every Other Day/15 Days Discharge Instructions: Apply moisturizing lotion as directed Prim Dressing: Hydrofera Blue Ready Transfer Foam, 2.5x2.5 (in/in) (DME) (Generic) Every Other Day/15 Days ary Discharge Instructions: Apply directly to wound bed as directed Secondary Dressing: Woven Gauze Sponges 2x2 in (DME) (Generic) Every Other Day/15 Days Discharge Instructions: Apply over primary dressing as directed. Secondary Dressing: Zetuvit Plus Silicone Border Dressing 3x3 (in/in) (DME) (Generic) Every Other Day/15 Days Discharge Instructions: Apply silicone border over primary dressing as directed. Com pression Wrap: tubigrip size E double layer Every Other Day/15 Days Discharge Instructions: apply in the morning and remove at night. 1. Because of the complexity of scheduling over the vacation time. We put him in a double layer Tubigrip today. We gave him measurements for compression stockings which I do not believe is the first time we have done this. We sent this to elastic therapy 2. Continue the [facility] and we will follow him in 3 weeks. 3. I have advised him to keep his leg elevated. He expressed understanding 4. Continue Hydrofera Blue as the primary dressing Electronic Signature(s) Signed: 08/02/2023 5:18:32 PM By: Baltazar Najjar MD Entered By: Baltazar Najjar on 08/02/2023 09:31:51 -------------------------------------------------------------------------------- SuperBill Details Patient Name: Date of Service: Anthony Barnes, Anthony [name] 08/02/2023 Medical Record Number: [id number] Patient Account Number: [id number] Date of Birth/Sex: Treating RN: [birth year]  (67 y.o. Anthony Barnes Primary Care Provider: Hoy Register Other Clinician: Referring Provider: Treating Provider/Extender: Leotis Pain in Treatment: 5 TABORIS, TEDROW ([id number]) [id number] Page 10 of 10 Diagnosis Coding ICD-10 Codes Code Description [phone] Non-pressure chronic ulcer of other part of left lower leg with other specified severity S80.812D Abrasion, left lower leg, subsequent encounter E11.622 Type 2 diabetes mellitus with other skin ulcer I87.312 Chronic venous hypertension (idiopathic) with ulcer of left lower extremity Facility Procedures : CPT4 Code: [id number] Description: 99213 - WOUND CARE VISIT-LEV 3 EST PT Modifier: Quantity: 1 Physician Procedures : CPT4 Code Description Modifier [id number] 99213 - WC PHYS LEVEL 3 - EST PT ICD-10 Diagnosis Description L97.828 Non-pressure chronic ulcer of other part of left lower leg with other specified severity S80.812D Abrasion, left lower leg, subsequent  encounter I87.312 Chronic venous hypertension (idiopathic) with ulcer of left lower extremity Quantity: 1 Electronic Signature(s) Signed: 08/02/2023 5:18:32 PM By: Baltazar Najjar MD Entered By: Baltazar Najjar on 08/02/2023 09:32:49

## 2023-08-03 NOTE — Progress Notes (Signed)
SMILEY, BLOSSER ([id number]) [id number] Page 1 of 8 Visit Report for 08/02/2023 Arrival Information Details Patient Name: Date of Service: Anthony Barnes Anthony Barnes. 08/02/2023 8:45 A M Medical Record Number: [id number] Patient Account Number: [id number] Date of Birth/Sex: Treating RN: Jun 19, 1956 (67 y.o. Anthony Barnes, Anthony Barnes Primary Care Anthony Barnes: Anthony Barnes Other Clinician: Referring Serenah Mill: Treating Anthony Barnes/Extender: Anthony Barnes: 5 Visit Information History Since Last Visit Added or deleted any medications: No Patient Arrived: Ambulatory Any new allergies or adverse reactions: No Arrival Time: 09:00 Had a fall or experienced change in No Accompanied By: self activities of daily living that may affect Transfer Assistance: None risk of falls: Patient Identification Verified: Yes Signs or symptoms of abuse/neglect since last visito No Secondary Verification Process Completed: Yes Hospitalized since last visit: No Patient Requires Transmission-Based Precautions: No Implantable device outside of the clinic excluding No Patient Has Alerts: No cellular tissue based products placed in the center since last visit: Has Dressing in Place as Prescribed: Yes Has Compression in Place as Prescribed: Yes Pain Present Now: No Electronic Signature(s) Signed: 08/02/2023 6:09:01 PM By: Anthony Stall RN, BSN Entered By: Anthony Barnes on 08/02/2023 09:02:55 -------------------------------------------------------------------------------- Clinic Level of Care Assessment Details Patient Name: Date of Service: Anthony Barnes Anthony Barnes. 08/02/2023 8:45 A M Medical Record Number: [id number] Patient Account Number: [id number] Date of Birth/Sex: Treating RN: Apr 03, 1956 (67 y.o. Anthony Barnes Primary Care Anthony Barnes: Anthony Barnes Other Clinician: Referring Anthony Barnes: Treating Anthony Barnes/Extender: Anthony Barnes Weeks in  Barnes: 5 Clinic Level of Care Assessment Items TOOL 4 Quantity Score X- 1 0 Use when only an EandM is performed on FOLLOW-UP visit ASSESSMENTS - Nursing Assessment / Reassessment X- 1 10 Reassessment of Co-morbidities (includes updates in patient status) X- 1 5 Reassessment of Adherence to Barnes Plan ASSESSMENTS - Wound and Skin A ssessment / Reassessment X - Simple Wound Assessment / Reassessment - one wound 1 5 []  - 0 Complex Wound Assessment / Reassessment - multiple wounds []  - 0 Dermatologic / Skin Assessment (not related to wound area) ASSESSMENTS - Focused Assessment X- 1 5 Circumferential Edema Measurements - multi extremities []  - 0 Nutritional Assessment / Counseling / Intervention Anthony Barnes, Anthony Barnes ([id number]) [id number] Page 2 of 8 []  - 0 Lower Extremity Assessment (monofilament, tuning fork, pulses) []  - 0 Peripheral Arterial Disease Assessment (using hand held doppler) ASSESSMENTS - Ostomy and/or Continence Assessment and Care []  - 0 Incontinence Assessment and Management []  - 0 Ostomy Care Assessment and Management (repouching, etc.) PROCESS - Coordination of Care X - Simple Patient / Family Education for ongoing care 1 15 []  - 0 Complex (extensive) Patient / Family Education for ongoing care X- 1 10 Staff obtains Chiropractor, Records, T Results / Process Orders est []  - 0 Staff telephones HHA, Nursing Homes / Clarify orders / etc []  - 0 Routine Transfer to another Facility (non-emergent condition) []  - 0 Routine Hospital Admission (non-emergent condition) []  - 0 New Admissions / Manufacturing engineer / Ordering NPWT Apligraf, etc. , []  - 0 Emergency Hospital Admission (emergent condition) X- 1 10 Simple Discharge Coordination []  - 0 Complex (extensive) Discharge Coordination PROCESS - Special Needs []  - 0 Pediatric / Minor Patient Management []  - 0 Isolation Patient Management []  - 0 Hearing / Language /  Visual special needs []  - 0 Assessment of Community assistance (transportation, D/C planning, etc.) []  - 0 Additional assistance / Altered mentation []  - 0 Support Surface(s) Assessment (bed, cushion, seat, etc.)  INTERVENTIONS - Wound Cleansing / Measurement X - Simple Wound Cleansing - one wound 1 5 []  - 0 Complex Wound Cleansing - multiple wounds X- 1 5 Wound Imaging (photographs - any number of wounds) []  - 0 Wound Tracing (instead of photographs) X- 1 5 Simple Wound Measurement - one wound []  - 0 Complex Wound Measurement - multiple wounds INTERVENTIONS - Wound Dressings X - Small Wound Dressing one or multiple wounds 1 10 []  - 0 Medium Wound Dressing one or multiple wounds []  - 0 Large Wound Dressing one or multiple wounds []  - 0 Application of Medications - topical []  - 0 Application of Medications - injection INTERVENTIONS - Miscellaneous []  - 0 External ear exam []  - 0 Specimen Collection (cultures, biopsies, blood, body fluids, etc.) []  - 0 Specimen(s) / Culture(s) sent or taken to Lab for analysis []  - 0 Patient Transfer (multiple staff / Nurse, adult / Similar devices) []  - 0 Simple Staple / Suture removal (25 or less) []  - 0 Complex Staple / Suture removal (26 or more) []  - 0 Hypo / Hyperglycemic Management (close monitor of Blood Glucose) [name], [name] ([id number]) [id number] Page 3 of 8 []  - 0 Ankle / Brachial Index (ABI) - do not check if billed separately X- 1 5 Vital Signs Has the patient been seen at the hospital within the last three years: Yes Total Score: 90 Level Of Care: New/Established - Level 3 Electronic Signature(s) Signed: 08/02/2023 6:09:01 PM By: Anthony Stall RN, BSN Entered By: Anthony Barnes on 08/02/2023 09:18:37 -------------------------------------------------------------------------------- Encounter Discharge Information Details Patient Name: Date of Service: Anthony Barnes, Anthony Anthony Barnes. 08/02/2023  8:45 A M Medical Record Number: [id number] Patient Account Number: [id number] Date of Birth/Sex: Treating RN: [birth year] (67 y.o. Anthony Barnes Primary Care Anthony Barnes: Anthony Barnes Other Clinician: Referring Chasity Outten: Treating Otila Starn/Extender: Leotis Pain in Barnes: 5 Encounter Discharge Information Items Discharge Condition: Stable Ambulatory Status: Ambulatory Discharge Destination: Home Transportation: Private Auto Accompanied By: self Schedule Follow-up Appointment: Yes Clinical Summary of Care: Electronic Signature(s) Signed: 08/02/2023 6:09:01 PM By: Anthony Stall RN, BSN Entered By: Anthony Barnes on 08/02/2023 09:19:01 -------------------------------------------------------------------------------- Lower Extremity Assessment Details Patient Name: Date of Service: Anthony Barnes, Anthony Anthony Barnes. 08/02/2023 8:45 A M Medical Record Number: [id number] Patient Account Number: [id number] Date of Birth/Sex: Treating RN: 1955-10-05 (67 y.o. Anthony Barnes Primary Care Aleeta Schmaltz: Anthony Barnes Other Clinician: Referring Ziyan Schoon: Treating Nash Bolls/Extender: Anthony Barnes: 5 Edema Assessment Assessed: [Left: Yes] [Right: No] Edema: [Left: Ye] [Right: s] Calf Left: Right: Point of Measurement: From Medial Instep 44 cm Ankle Left: Right: Point of Measurement: From Medial Instep 24 cm Knee To Floor Left: Right: IVER, MELLEY ([id number]) [id number].pdf Page 4 of 8 From Medial Instep 58 cm Vascular Assessment Pulses: Dorsalis Pedis Palpable: [Left:Yes] Extremity colors, hair growth, and conditions: Extremity Color: [Left:Normal] Hair Growth on Extremity: [Left:No] Temperature of Extremity: [Left:Warm] Capillary Refill: [Left:> 3 seconds] Dependent Rubor: [Left:No] Blanched when Elevated: [Left:No Yes] Toe Nail Assessment Left: Right: Thick: No Discolored: No Deformed:  No Improper Length and Hygiene: Yes Electronic Signature(s) Signed: 08/02/2023 6:09:01 PM By: Anthony Stall RN, BSN Entered By: Anthony Barnes on 08/02/2023 09:18:02 -------------------------------------------------------------------------------- Multi-Disciplinary Care Plan Details Patient Name: Date of Service: Anthony Barnes, Anthony Anthony Barnes. 08/02/2023 8:45 A M Medical Record Number: [id number] Patient Account Number: [id number] Date of Birth/Sex: Treating RN: 01-21-1956 (67 y.o. Anthony Barnes Primary Care Christianne Zacher: Anthony Barnes Other Clinician: Referring Hazeline Charnley: Treating Arien Morine/Extender: Leanord Hawking  Gweneth Fritter, Enobong Weeks in Barnes: 5 Active Inactive Wound/Skin Impairment Nursing Diagnoses: Impaired tissue integrity Knowledge deficit related to ulceration/compromised skin integrity Goals: Patient/caregiver will verbalize understanding of skin care regimen Date Initiated: 06/25/2023 Target Resolution Date: 09/07/2023 Goal Status: Active Ulcer/skin breakdown will have a volume reduction of 30% by week 4 Date Initiated: 06/25/2023 Date Inactivated: 08/02/2023 Target Resolution Date: 08/09/2023 Goal Status: Met Interventions: Assess patient/caregiver ability to obtain necessary supplies Assess patient/caregiver ability to perform ulcer/skin care regimen upon admission and as needed Assess ulceration(s) every visit Provide education on ulcer and skin care Notes: Electronic Signature(s) Signed: 08/02/2023 6:09:01 PM By: Anthony Stall RN, BSN Entered By: Anthony Barnes on 08/02/2023 09:07:53 Anthony Barnes ([id number]) [id number] Page 5 of 8 -------------------------------------------------------------------------------- Pain Assessment Details Patient Name: Date of Service: Anthony Barnes Anthony Barnes. 08/02/2023 8:45 A M Medical Record Number: [id number] Patient Account Number: [id number] Date of Birth/Sex: Treating RN: 1956-01-18 (67 y.o. Anthony Barnes Primary Care Renia Mikelson: Anthony Barnes Other Clinician: Referring Ameia Morency: Treating Wendie Diskin/Extender: Anthony Barnes: 5 Active Problems Location of Pain Severity and Description of Pain Patient Has Paino No Site Locations Rate the pain. Current Pain Level: 0 Pain Management and Medication Current Pain Management: Medication: No Cold Application: No Rest: No Massage: No Activity: No T.BarnesN.S.: No Heat Application: No Leg drop or elevation: No Is the Current Pain Management Adequate: Adequate How does your wound impact your activities of daily livingo Sleep: No Bathing: No Appetite: No Relationship With Others: No Bladder Continence: No Emotions: No Bowel Continence: No Work: No Toileting: No Drive: No Dressing: No Hobbies: No Psychologist, prison and probation services) Signed: 08/02/2023 6:09:01 PM By: Anthony Stall RN, BSN Entered By: Anthony Barnes on 08/02/2023 09:03:27 -------------------------------------------------------------------------------- Patient/Caregiver Education Details Patient Name: Date of Service: Anthony Barnes, Anthony Anthony Barnes. 12/19/2024andnbsp8:45 A M Medical Record Number: [id number] Patient Account Number: [id number] Date of Birth/Gender: Treating RN: 25-Dec-1955 (67 y.o. Anthony Barnes Primary Care Physician: Anthony Barnes Other Clinician: Arnetha Barnes ([id number]) [id number] Page 6 of 8 Referring Physician: Treating Physician/Extender: Leotis Pain in Barnes: 5 Education Assessment Education Provided To: Patient Education Topics Provided Wound/Skin Impairment: Handouts: Caring for Your Ulcer Methods: Explain/Verbal Responses: Reinforcements needed Electronic Signature(s) Signed: 08/02/2023 6:09:01 PM By: Anthony Stall RN, BSN Entered By: Anthony Barnes on 08/02/2023  09:08:24 -------------------------------------------------------------------------------- Wound Assessment Details Patient Name: Date of Service: Anthony Barnes, Anthony Anthony Barnes. 08/02/2023 8:45 A M Medical Record Number: [id number] Patient Account Number: [id number] Date of Birth/Sex: Treating RN: 04-May-1956 (67 y.o. Anthony Barnes Primary Care Kiven Vangilder: Anthony Barnes Other Clinician: Referring Zanna Hawn: Treating Quention Mcneill/Extender: Anthony Barnes: 5 Wound Status Wound Number: 3 Primary Etiology: Abrasion Wound Location: Left, Anterior Lower Leg Secondary Etiology: Diabetic Wound/Ulcer of the Lower Extremity Wounding Event: Trauma Wound Status: Open Date Acquired: 04/17/2023 Comorbid History: Hypertension, Type II Diabetes Weeks Of Barnes: 5 Clustered Wound: No Photos Wound Measurements Length: (cm) 1.3 Width: (cm) 0.7 Depth: (cm) 0.1 Area: (cm) 0.715 Volume: (cm) 0.071 % Reduction in Area: 24.1% % Reduction in Volume: 24.5% Epithelialization: Medium (34-66%) Tunneling: No Undermining: No Wound Description Classification: Full Thickness Without Exposed Support Wound Margin: Distinct, outline attached Exudate Amount: Medium Exudate Type: Serosanguineous Exudate Color: red, brown Anthony Barnes, Anthony Barnes ([id number]) Wound Bed Granulation Amount: Large (67-100%) Granulation Quality: Red Necrotic Amount: None Present (0%) Structures Foul Odor After Cleansing: No Slough/Fibrino No [id number] Page 7 of 8 Exposed Structure Fascia Exposed: No Fat Layer (Subcutaneous Tissue)  Exposed: Yes Tendon Exposed: No Muscle Exposed: No Joint Exposed: No Bone Exposed: No Periwound Skin Texture Texture Color No Abnormalities Noted: No No Abnormalities Noted: No Callus: No Atrophie Blanche: No Crepitus: No Cyanosis: No Excoriation: No Ecchymosis: No Induration: No Erythema: No Rash: No Hemosiderin Staining: No Scarring:  No Mottled: No Pallor: No Moisture Rubor: No No Abnormalities Noted: No Dry / Scaly: No Temperature / Pain Maceration: No Temperature: No Abnormality Barnes Notes Wound #3 (Lower Leg) Wound Laterality: Left, Anterior Cleanser Soap and Water Discharge Instruction: May shower and wash wound with dial antibacterial soap and water prior to dressing change. Vashe 5.8 (oz) Discharge Instruction: Cleanse the wound with Vashe prior to applying a clean dressing using gauze sponges, not tissue or cotton balls. Peri-Wound Care Skin Prep Discharge Instruction: Use skin prep as directed Sween Lotion (Moisturizing lotion) Discharge Instruction: Apply moisturizing lotion as directed Topical Primary Dressing Hydrofera Blue Ready Transfer Foam, 2.5x2.5 (in/in) Discharge Instruction: Apply directly to wound bed as directed Secondary Dressing Woven Gauze Sponges 2x2 in Discharge Instruction: Apply over primary dressing as directed. Zetuvit Plus Silicone Border Dressing 3x3 (in/in) Discharge Instruction: Apply silicone border over primary dressing as directed. Secured With Compression Wrap tubigrip size Barnes double layer Discharge Instruction: apply in the morning and remove at night. Compression Stockings Add-Ons Electronic Signature(s) Signed: 08/02/2023 6:09:01 PM By: Anthony Stall RN, BSN Entered By: Anthony Barnes on 08/02/2023 09:05:46 Anthony Barnes ([id number]) [id number] Page 8 of 8 -------------------------------------------------------------------------------- Vitals Details Patient Name: Date of Service: Anthony Barnes Anthony Barnes. 08/02/2023 8:45 A M Medical Record Number: [id number] Patient Account Number: [id number] Date of Birth/Sex: Treating RN: 1955/11/26 (67 y.o. Anthony Barnes Primary Care Nosson Wender: Anthony Barnes Other Clinician: Referring Cameka Rae: Treating Lamark Schue/Extender: Anthony Barnes: 5 Vital  Signs Time Taken: 09:00 Temperature (F): 98.3 Pulse (bpm): 67 Respiratory Rate (breaths/min): 20 Blood Pressure (mmHg): 152/98 Capillary Blood Glucose (mg/dl): 762 Reference Range: 80 - 120 mg / dl Electronic Signature(s) Signed: 08/02/2023 6:09:01 PM By: Anthony Stall RN, BSN Entered By: Anthony Barnes on 08/02/2023 09:03:18

## 2023-08-07 ENCOUNTER — Other Ambulatory Visit: Payer: Self-pay | Admitting: Family Medicine

## 2023-08-07 DIAGNOSIS — I152 Hypertension secondary to endocrine disorders: Secondary | ICD-10-CM

## 2023-08-10 ENCOUNTER — Telehealth: Payer: Self-pay | Admitting: *Deleted

## 2023-08-10 NOTE — Telephone Encounter (Signed)
Chart reviewed and [employer] verification completed.    Chart review highlights and potential barriers to colorectal cancer screening (CRC) screening: Per patient's health maintenance, patient had colonoscopy on 12/07/14, no results in [facility], and is due for next screening on 12/06/24.  Patient 's most recent GI referral to [facility] was closed on 05/29/15 because referral was expired.  Patient has seen Dr. Danise Edge at [facility] in the past.   Patient had GI referral placed on 12/07/14 and it is currently closed.  Per 06/25/17 office visit note, patient had declined colonoscopy.    Telephone call to patient, no answer, left voicemail message, advised calling from Dr. Baxter Flattery office, and requested call back at my direct extension [phone]).  Health Equity Program Manager [facility]) has also follow up with Quality Performance Specialist [facility] Cairrikier Ignacia Palma) to verify patient's health maintenance colonoscopy due date and last screening date, since last patient outreach attempt.      Aneya Daddona H. Docia Furl, Charity fundraiser, [facility] [phone]

## 2023-08-21 ENCOUNTER — Ambulatory Visit: Payer: Self-pay | Attending: Family Medicine | Admitting: Family Medicine

## 2023-08-21 ENCOUNTER — Encounter: Payer: Self-pay | Admitting: Family Medicine

## 2023-08-21 VITALS — BP 183/95 | HR 80 | Ht 76.0 in | Wt 307.2 lb

## 2023-08-21 DIAGNOSIS — E1169 Type 2 diabetes mellitus with other specified complication: Secondary | ICD-10-CM

## 2023-08-21 DIAGNOSIS — I152 Hypertension secondary to endocrine disorders: Secondary | ICD-10-CM

## 2023-08-21 DIAGNOSIS — E785 Hyperlipidemia, unspecified: Secondary | ICD-10-CM

## 2023-08-21 DIAGNOSIS — Z7984 Long term (current) use of oral hypoglycemic drugs: Secondary | ICD-10-CM

## 2023-08-21 DIAGNOSIS — E1149 Type 2 diabetes mellitus with other diabetic neurological complication: Secondary | ICD-10-CM

## 2023-08-21 DIAGNOSIS — R400 Somnolence: Secondary | ICD-10-CM

## 2023-08-21 DIAGNOSIS — E1159 Type 2 diabetes mellitus with other circulatory complications: Secondary | ICD-10-CM

## 2023-08-21 LAB — POCT GLYCOSYLATED HEMOGLOBIN (HGB A1C): HbA1c, POC (controlled diabetic range): 8.9 % — AB (ref 0.0–7.0)

## 2023-08-21 MED ORDER — PIOGLITAZONE HCL 15 MG PO TABS
15.0000 mg | ORAL_TABLET | Freq: Every day | ORAL | 1 refills | Status: DC
Start: 2023-08-21 — End: 2024-03-03

## 2023-08-21 MED ORDER — CARVEDILOL 25 MG PO TABS
25.0000 mg | ORAL_TABLET | Freq: Two times a day (BID) | ORAL | 1 refills | Status: DC
Start: 2023-08-21 — End: 2024-06-17

## 2023-08-21 MED ORDER — VALSARTAN-HYDROCHLOROTHIAZIDE 320-25 MG PO TABS: 1.0000 | ORAL_TABLET | Freq: Every day | ORAL | 1 refills | Status: DC

## 2023-08-21 NOTE — Patient Instructions (Signed)
 VISIT SUMMARY:  During today's visit, we discussed your concerns about excessive daytime sleepiness, which you believe is related to your carvedilol  medication. We also reviewed your hypertension and diabetes management, including your recent HbA1c levels and current medications. Additionally, we addressed your concerns about diuretics and kidney function.  YOUR PLAN:  -HYPERTENSION: Hypertension, or high blood pressure, is a condition where the force of the blood against your artery walls is too high. We discussed your excessive daytime sleepiness and decided to reduce your carvedilol  dose to 12.5mg  in the morning while continuing 25mg  at night. Please continue your current regimen of Lisinopril , Amlodipine , Clonidine , and Hydrochlorothiazide . Monitor your blood pressure at home and report the readings via MyChart. We plan to switch Lisinopril  /hydrochlorothiazide  to Valsartan /hydrochlorothiazide  once you have completed your current lisinopril /HCTZ and I will follow-up with you in 6 weeks to reassess your blood pressure.  -TYPE 2 DIABETES MELLITUS: Type 2 Diabetes Mellitus is a condition that affects the way your body processes blood sugar (glucose). Your HbA1c has increased from 8.5 to 8.9. We will continue your Glipizide  10mg  twice daily and resume Actos  15mg  once daily. Please follow up in 6 weeks to assess your blood glucose control.  -GENERAL HEALTH MAINTENANCE: We recommend a referral for an eye exam to assess for diabetic retinopathy, which is a diabetes complication that affects the eyes.  INSTRUCTIONS:  Please monitor your blood pressure at home and report the readings via MyChart. Follow up in 6 weeks to assess your blood glucose control. Additionally, schedule an eye exam to check for diabetic retinopathy.

## 2023-08-21 NOTE — Progress Notes (Signed)
 Subjective:  Patient ID: Anthony Barnes, male    DOB: 1955-12-03  Age: 68 y.o. MRN: [id number]  CC: Medical Management of Chronic Issues (Discuss carvedilol )   HPI Anthony Barnes is a 68 y.o. year old male with a history of hypertension, type 2 diabetes mellitus A1c ( A1c 8.9), morbid obesity here for follow-up visit.   Interval History: Discussed the use of AI scribe software for clinical note transcription with the patient, who gave verbal consent to proceed.   He presents with concerns about excessive daytime sleepiness, which he attributes to his current medication, carvedilol . He reports that the sleepiness is significantly worse when taking the medication twice a day, compared to when he was taking it only at night. The patient also mentions that he has been splitting the carvedilol  dose, taking half in the morning and a full dose at night, which seemed to alleviate the sleepiness. However, he stopped this practice upon medical advice.  In addition to the sleepiness, the patient expresses concerns about the use of diuretics due to a family member's experience with kidney problems and dialysis. He is currently taking hydrochlorothiazide  as part of his hypertension management. Despite these concerns, the patient's kidney function is reported to be normal.  The patient's diabetes management is also discussed. His recent HbA1c was 8.9, up from 8.5. He is currently taking glipizide  for diabetes management, and it is noted that he was previously prescribed Actos , but he is not currently taking it.        Past Medical History:  Diagnosis Date   Diabetes mellitus without complication (HCC)    Hypertension     Past Surgical History:  Procedure Laterality Date   BRAIN SURGERY     ESOPHAGOGASTRODUODENOSCOPY N/A 03/22/2013   Procedure: ESOPHAGOGASTRODUODENOSCOPY (EGD);  Surgeon: Lesta JULIANNA Fitz, MD;  Location: [facility] ENDOSCOPY;  Service: Endoscopy;  Laterality: N/A;    Family History   Problem Relation Age of Onset   Hypertension Mother    Cancer Mother 49       ovarian   Diabetes Father    Stroke Father     Social History   Socioeconomic History   Marital status: Married    Spouse name: Not on file   Number of children: Not on file   Years of education: Not on file   Highest education level: Not on file  Occupational History   Not on file  Tobacco Use   Smoking status: Former    Current packs/day: 0.00    Average packs/day: 1 pack/day for 16.0 years (16.0 ttl pk-yrs)    Types: Cigarettes    Start date: 08/14/1974    Quit date: 08/14/1990    Years since quitting: 33.0   Smokeless tobacco: Former    Quit date: 08/14/1990  Vaping Use   Vaping status: Never Used  Substance and Sexual Activity   Alcohol use: Not Currently    Comment: weekends only   Drug use: No   Sexual activity: Not on file  Other Topics Concern   Not on file  Social History Narrative   Not on file   Social Drivers of Health   Financial Resource Strain: High Risk (08/21/2023)   Overall Financial Resource Strain (CARDIA)    Difficulty of Paying Living Expenses: Hard  Food Insecurity: No Food Insecurity (08/21/2023)   Hunger Vital Sign    Worried About Running Out of Food in the Last Year: Never true    Ran Out of Food in the Last  Year: Never true  Transportation Needs: No Transportation Needs (08/21/2023)   PRAPARE - Administrator, Civil Service (Medical): No    Lack of Transportation (Non-Medical): No  Physical Activity: Insufficiently Active (08/21/2023)   Exercise Vital Sign    Days of Exercise per Week: 3 days    Minutes of Exercise per Session: 20 min  Stress: No Stress Concern Present (08/21/2023)   [employer] of Occupational Health - Occupational Stress Questionnaire    Feeling of Stress : Not at all  Social Connections: Socially Integrated (08/21/2023)   Social Connection and Isolation Panel [NHANES]    Frequency of Communication with Friends and Family: More  than three times a week    Frequency of Social Gatherings with Friends and Family: More than three times a week    Attends Religious Services: More than 4 times per year    Active Member of [employer] or Organizations: Yes    Attends Engineer, Structural: More than 4 times per year    Marital Status: Married    Allergies  Allergen Reactions   Shellfish Allergy Anaphylaxis   Codeine  Itching    unknown    Outpatient Medications Prior to Visit  Medication Sig Dispense Refill   amLODipine  (NORVASC ) 10 MG tablet Take 1 tablet (10 mg total) by mouth daily. 90 tablet 1   atorvastatin  (LIPITOR) 40 MG tablet Take 1 tablet (40 mg total) by mouth daily. 90 tablet 1   Blood Glucose Monitoring Suppl (TRUE METRIX METER) w/Device KIT Check blood sugar 3 times daily. 1 kit 0   cloNIDine  (CATAPRES ) 0.3 MG tablet TAKE ONE TABLET BY MOUTH TWICE DAILY **MUST KEEP OFFICE VISIT FOR REFILLS** 180 tablet 0   glipiZIDE  (GLUCOTROL ) 10 MG tablet TAKE ONE TABLET BY MOUTH TWICE DAILY BEFORE MEALS 180 tablet 1   ibuprofen  (ADVIL ,MOTRIN ) 800 MG tablet Take 1 tablet (800 mg total) by mouth every 12 (twelve) hours as needed. 30 tablet 0   Multiple Vitamin (MULTIVITAMIN WITH MINERALS) TABS tablet Take 1 tablet by mouth daily.     Olopatadine  HCl 0.2 % SOLN Apply 1 drop to eye daily. 2.5 mL 0   omega-3 acid ethyl esters (LOVAZA) 1 g capsule Take 1 g by mouth 2 (two) times daily.     TRUEplus Lancets 28G MISC CHECK BLOOD SUGAR 3 TIMES DAILY. 100 each 2   carvedilol  (COREG ) 25 MG tablet Take 0.5 tablets (12.5 mg total) by mouth 2 (two) times daily with a meal. 180 tablet 1   lisinopril -hydrochlorothiazide  (ZESTORETIC ) 20-12.5 MG tablet Take 2 tablets by mouth daily. 180 tablet 1   bacitracin  ointment Apply 1 application topically 2 (two) times daily. (Patient not taking: Reported on 08/21/2023) 120 g 0   cetirizine  (ZYRTEC  ALLERGY) 10 MG tablet Take 1 tablet (10 mg total) by mouth daily. (Patient not taking: Reported on  08/21/2023) 7 tablet 0   erythromycin  ophthalmic ointment Place a 1/2 inch ribbon of ointment into the lower eyelid 4 times daily for 7 days. (Patient not taking: Reported on 08/21/2023) 3.5 g 0   gabapentin  (NEURONTIN ) 300 MG capsule Take 1 capsule (300 mg total) by mouth at bedtime. (Patient not taking: Reported on 08/21/2023) 90 capsule 1   pioglitazone  (ACTOS ) 15 MG tablet Take 1 tablet (15 mg total) by mouth daily. (Patient not taking: Reported on 08/21/2023) 90 tablet 1   traMADol  (ULTRAM ) 50 MG tablet TAKE ONE TABLET BY MOUTH TWICE DAILY BEFORE AND AFTER DEBRIDEMENT PROCEDURE (Patient not taking: Reported  on 08/21/2023) 7 tablet 0   No facility-administered medications prior to visit.     ROS Review of Systems  Constitutional:  Negative for activity change and appetite change.  HENT:  Negative for sinus pressure and sore throat.   Respiratory:  Negative for chest tightness, shortness of breath and wheezing.   Cardiovascular:  Negative for chest pain and palpitations.  Gastrointestinal:  Negative for abdominal distention, abdominal pain and constipation.  Genitourinary: Negative.   Musculoskeletal: Negative.   Psychiatric/Behavioral:  Negative for behavioral problems and dysphoric mood.     Objective:  BP (!) 183/95   Pulse 80   Ht 6' 4 (1.93 m)   Wt (!) 307 lb 3.2 oz (139.3 kg)   SpO2 99%   BMI 37.39 kg/m      08/21/2023    4:27 PM 08/21/2023    3:52 PM 05/16/2023    4:06 PM  BP/Weight  Systolic BP 183 162 138  Diastolic BP 95 90 88  Wt. (Lbs)  307.2   BMI  37.39 kg/m2       Physical Exam Constitutional:      Appearance: He is well-developed.  Cardiovascular:     Rate and Rhythm: Normal rate.     Heart sounds: Normal heart sounds. No murmur heard. Pulmonary:     Effort: Pulmonary effort is normal.     Breath sounds: Normal breath sounds. No wheezing or rales.  Chest:     Chest wall: No tenderness.  Abdominal:     General: Bowel sounds are normal. There is no  distension.     Palpations: Abdomen is soft. There is no mass.     Tenderness: There is no abdominal tenderness.  Musculoskeletal:        General: Normal range of motion.     Right lower leg: No edema.     Left lower leg: No edema.  Neurological:     Mental Status: He is alert and oriented to person, place, and time.  Psychiatric:        Mood and Affect: Mood normal.        Latest Ref Rng & Units 10/17/2022    4:43 PM 12/20/2021    2:13 PM 07/20/2020    9:03 AM  CMP  Glucose 70 - 99 mg/dL 843  836  840   BUN 8 - 27 mg/dL 22  15  16    Creatinine 0.76 - 1.27 mg/dL 9.01  8.98  9.01   Sodium 134 - 144 mmol/L 138  134  138   Potassium 3.5 - 5.2 mmol/L 4.1  3.5  4.5   Chloride 96 - 106 mmol/L 100  101  101   CO2 20 - 29 mmol/L 22  25  24    Calcium  8.6 - 10.2 mg/dL 9.9  9.2  9.7   Total Protein 6.0 - 8.5 g/dL 7.9   7.4   Total Bilirubin 0.0 - 1.2 mg/dL 0.3   0.4   Alkaline Phos 44 - 121 IU/L 51   46   AST 0 - 40 IU/L 20   16   ALT 0 - 44 IU/L 26   20     Lipid Panel     Component Value Date/Time   CHOL 156 07/20/2020 0903   TRIG 48 07/20/2020 0903   HDL 49 07/20/2020 0903   CHOLHDL 3.2 07/20/2020 0903   CHOLHDL 3.2 07/21/2016 1051   VLDL 10 07/21/2016 1051   LDLCALC 97 07/20/2020 0903    CBC  Component Value Date/Time   WBC 8.1 11/25/2017 0407   RBC 4.82 11/25/2017 0407   HGB 13.5 11/25/2017 0407   HCT 39.1 11/25/2017 0407   PLT 211 11/25/2017 0407   MCV 81.1 11/25/2017 0407   MCH 28.0 11/25/2017 0407   MCHC 34.5 11/25/2017 0407   RDW 15.4 11/25/2017 0407   LYMPHSABS 1.8 10/20/2013 1250   MONOABS 0.7 10/20/2013 1250   EOSABS 0.1 10/20/2013 1250   BASOSABS 0.0 10/20/2013 1250    Lab Results  Component Value Date   HGBA1C 8.9 (A) 08/21/2023    Assessment & Plan:      Hypertension Uncontrolled on current regimen. Patient reports excessive daytime sleepiness, which he attributes to carvedilol . Discussed the importance of consistent dosing and the risk of  uncontrolled hypertension. Patient agreed to try a reduced dose of carvedilol  during the day while monitoring blood pressure at home. -Continue current regimen of  Amlodipine  once daily, Clonidine  twice daily, and Hydrochlorothiazide  twice daily. -Reduce Carvedilol  to 12.5mg  in the morning and continue 25mg  at night. -Discontinue lisinopril /HCTZ and initiate valsartan /HCTZ 320 mg / 25 mg once he has depleted his current lisinopril /HCTZ supply -Patient to monitor blood pressure at home and report readings via MyChart. -I will see him back in 6 weeks to reassess his blood pressure  Somnolence -He attributes this to carvedilol  but I have informed him he is on clonidine  which is sedating -He is social carvedilol  is the culprit and so I have advised him to take half a tablet of carvedilol  during the daytime while monitoring his blood pressure and he can take the whole 25 mg at night.  Type 2 Diabetes Mellitus A1C increased from 8.5 to 8.9. Patient was not taking prescribed Actos . -Continue Glipizide  10mg  twice daily. -Resume Actos  15mg  once daily. -He is a candidate for the LIBERATE study but he declines -Follow-up in 6 weeks to assess blood glucose control.   General Health Maintenance -Refer for eye exam to assess for diabetic retinopathy.          Meds ordered this encounter  Medications   carvedilol  (COREG ) 25 MG tablet    Sig: Take 1 tablet (25 mg total) by mouth 2 (two) times daily with a meal.    Dispense:  180 tablet    Refill:  1    Dispense 90-day supply; dose change   pioglitazone  (ACTOS ) 15 MG tablet    Sig: Take 1 tablet (15 mg total) by mouth daily.    Dispense:  90 tablet    Refill:  1   valsartan -hydrochlorothiazide  (DIOVAN -HCT) 320-25 MG tablet    Sig: Take 1 tablet by mouth daily.    Dispense:  90 tablet    Refill:  1    Discontinue lisinopril /HCTZ    Follow-up: Return in about 6 weeks (around 10/02/2023) for Blood Pressure follow-up with PCP.        Corrina Sabin, MD, FAAFP. [facility] [place], [place] [phone]   08/21/2023, 4:38 PM

## 2023-08-23 ENCOUNTER — Encounter (HOSPITAL_BASED_OUTPATIENT_CLINIC_OR_DEPARTMENT_OTHER): Payer: Self-pay | Attending: Internal Medicine | Admitting: Internal Medicine

## 2023-08-23 DIAGNOSIS — I87312 Chronic venous hypertension (idiopathic) with ulcer of left lower extremity: Secondary | ICD-10-CM

## 2023-08-23 DIAGNOSIS — W2209XA Striking against other stationary object, initial encounter: Secondary | ICD-10-CM | POA: Insufficient documentation

## 2023-08-23 DIAGNOSIS — L97828 Non-pressure chronic ulcer of other part of left lower leg with other specified severity: Secondary | ICD-10-CM

## 2023-08-23 DIAGNOSIS — S80812D Abrasion, left lower leg, subsequent encounter: Secondary | ICD-10-CM

## 2023-08-23 DIAGNOSIS — Z09 Encounter for follow-up examination after completed treatment for conditions other than malignant neoplasm: Secondary | ICD-10-CM | POA: Insufficient documentation

## 2023-08-23 DIAGNOSIS — S80812A Abrasion, left lower leg, initial encounter: Secondary | ICD-10-CM | POA: Insufficient documentation

## 2023-08-23 DIAGNOSIS — E11622 Type 2 diabetes mellitus with other skin ulcer: Secondary | ICD-10-CM

## 2023-08-23 LAB — MICROALBUMIN / CREATININE URINE RATIO
Creatinine, Urine: 67.6 mg/dL
Microalb/Creat Ratio: 22 mg/g{creat} (ref 0–29)
Microalbumin, Urine: 14.9 ug/mL

## 2023-08-23 LAB — BASIC METABOLIC PANEL
BUN/Creatinine Ratio: 19 (ref 10–24)
BUN: 20 mg/dL (ref 8–27)
CO2: 24 mmol/L (ref 20–29)
Calcium: 10 mg/dL (ref 8.6–10.2)
Chloride: 99 mmol/L (ref 96–106)
Creatinine, Ser: 1.05 mg/dL (ref 0.76–1.27)
Glucose: 181 mg/dL — ABNORMAL HIGH (ref 70–99)
Potassium: 4.3 mmol/L (ref 3.5–5.2)
Sodium: 137 mmol/L (ref 134–144)
eGFR: 79 mL/min/{1.73_m2} (ref >59)

## 2023-08-29 NOTE — Progress Notes (Signed)
 SAIGE, BUSBY ([id number]) [id number] Page 1 of 10 Visit Report for 08/23/2023 Chief Complaint Document Details Patient Name: Date of Service: Anthony GERDA CONES [name] 08/23/2023 9:00 A M Medical Record Number: [id number] Patient Account Number: [id number] Date of Birth/Sex: Treating RN: [birth year] (68 y.o. M) Primary Care Provider: Delbert Clam Other Clinician: Referring Provider: Treating Provider/Extender: Rosan Harlene Delbert Clam Weeks in Treatment: 8 Information Obtained from: Patient Chief Complaint Right LE Ulcer 06/25/2023; patient returns to clinic with a wound on his left anterior mid tibia Electronic Signature(s) Signed: 08/28/2023 12:04:19 PM By: Rosan Harlene DO Entered By: Rosan Harlene on 08/23/2023 09:40:56 -------------------------------------------------------------------------------- HPI Details Patient Name: Date of Service: Anthony GERDA, Anthony [name] 08/23/2023 9:00 A M Medical Record Number: [id number] Patient Account Number: [id number] Date of Birth/Sex: Treating RN: [birth year] (68 y.o. M) Primary Care Provider: Delbert Clam Other Clinician: Referring Provider: Treating Provider/Extender: Rosan Harlene Delbert Clam Weeks in Treatment: 8 History of Present Illness HPI Description: ADMISSION 03/12/18 This is a 68 year old and it was a type II diabetic reasonably poorly controlled with a recent hemoglobin A1c of 8.2. He was tending to his lawn on 01/15/18 when his weed [place] sent a rock up word hitting him in the right anterior leg. He was seen in his primary M.D. office is same time. An x-ray showed no abnormality. He was given a course of cephalexin . Since then he's been applying peroxide and topical antibiotics to the wound. He does not have a history of chronic wounds however he does have chronic edema in the lower legs. He is complaining of pain in the right leg wound but no real history of claudication. Past  medical history includes type 2 diabetes, hypertension, morbid obesity. ABIs in the right leg were noncompressible 03/19/18 on evaluation today patient appears to be tolerating the wrap in general fairly well. He states he's not having that much pain in regard to the wound itself. With that being said he is having some issues with slough buildup on the surface of the wound the Iodoflex does seem to be helpful in that regard. He is still having discomfort he wonders if I can send in a prescription for ibuprofen  or something of like to help him out. I do believe that the prox end may be of benefit for him. 03/25/18 on evaluation today patient appears to be doing very well in regard to his right lateral lower [name]. He's been tolerating the dressing changes without complication that is the wraps. Nonetheless he does continue to have pain he states is been dreading the possibility of having to have debridement again today. His first debridement experience was not optimal. With that being said he does seem to be showing signs of improvement there does appear to be little bit more granulation he does have a lot of slough that really does need to breeding away. 04/04/18;the patient went for arterial studies that were really quite normal. ABI on the right at 1.19 with triphasic waveforms on the left 1.11 with triphasic waveforms. TBI 0.93 on the right and 0.95 on the left. This suggests T should be able to tolerate even 4 layer compression. He is however complaining of a lot of pain with our wraps I'm wondering whether the pain is simply Iodoflex. I changed him to Santyl covering Hydrofera Blue. I'm still going to try to keep him in 3 layer compression 04/12/18 on evaluation today patient actually appears to be doing rather well in regard to his right lower  [name] ulcer. He is making good progress although it is slow still I do believe that the Santyl is much better than the Iodoflex. The  good news is the patient seems to be tolerating the dressing changes without complication now that we've gotten good of the Iodoflex which really did burn him quite significantly. Otherwise there's no evidence of infection. DRU, PRIMEAU ([id number]) [id number] Page 2 of 10 04/17/18 on evaluation today patient actually appears to be showing signs of improvement in regard to the ulcer. Unfortunately he's had somewhat of a rough day due to the fact that he found out that one of his friends suddenly and unexpectedly passed today. He states he's not sure he's in the frame of mind to allow me to debride the wound at this point. Nonetheless I do feel like he is showing signs of the wound getting better little by little each week. 04/24/18 on evaluation today patient's ulcer actually appears to be showing some signs of improvement albeit slow. He has been tolerating the dressing changes without complication except for the wrap which she states was so tight that he had to remove it it was causing a lot of discomfort. Fortunately there is no evidence of infection at this point. He states he only wore the wrap until about the time he got home last week and that he had to take it off. Nonetheless he does not have any other compression stockings, Juxta-Lite wraps, or anything otherwise to really help at this point as far as compression is concerned. 05/01/18 on evaluation today patient actually appears to be doing fairly well in regard to his lower extremity ulcer. He has been tolerating the dressing changes currently without complication. The wrap does seem to be helping as far as the fluid management is concerned. Wound bed itself actually show signs of good improvement although there is some Slough noted there's not as much as previous and he actually has fairly decent granulation noted as well. Overall I'm pleased with the progress of to this point. The patient would prefer not to  have any debridement today he states he's actually not feeling too well in general and he really does not want any additional discomfort typically debridement is fairly uncomfortable for him. 05/08/18 on evaluation today patient actually appears to be doing a little better in regard to his lower extremity ulcer. He has been tolerating the dressing changes without complication. With that being said I'm actually quite pleased with the fact the wound is not appear to be infected and in general has made a little progress. With that being said I do believe we need to perform some debridement to clean away the necrotic tissue on the surface of the wound. 05/15/18 on evaluation today patient actually appears to be doing a little better in regard to his wound. Fortunately there is some improvement noted fortunately there's also no significant evidence of infection at this point in time. He does have continued pain that really nothing different than previous he did get his Juxta-Lite wrap. 05/29/18 on evaluation today patient actually appears to be doing somewhat better in regard to his wound currently. He's been tolerating the dressing changes without complication. With that being said he has been performing the Hydrofera Blue Dressing changes every day at home. I'm pretty sure that we told him every other day last week or when I saw him rather two weeks ago. With that being said obviously did not hurt to do it daily just that obviously is  gonna run out of supplies sooner and that could get him into trouble as far as his insurance is concerned. Nonetheless he at this point still continues to have pain although honestly I don't feel like it's as bad as what it was in the past just based on his reactions at this point. 06/12/18 on evaluation today patient actually appears to be doing better in regard to his lower [name] ulcer. He's been tolerating the dressing changes without complication. Fortunately there does  not appear to be any evidence of infection at this time. No fevers, chills, nausea, or vomiting noted at this time. 06/19/18 evaluation today patient's ulcer on the lower extremity appears to be doing better at this point. he has been tolerating the dressing changes. The patient seems to be somewhat depressed about the progress of his wound although the overall appearance at this time seems to be doing well. In general I'm very pleased with the appearance today he does have some biofilm on the surface of the wound as well as of hyper granular tissue we may want to utilize a little bit of silver nitrate today. 06/27/2018; patient comes into clinic today for a wound on the right lateral calf likely related to chronic venous insufficiency. He has been using medihoney covered with Hydrofera Blue. Wound surface actually looks quite good 07/03/2018 seen today for follow-up and management of lower extremity ulcer right lateral shin. T olerating dressing changes. He has a small amount of biofilm today. Will attempt to remove a portion of the biofilm as he tolerates. He becomes very anxious with wound treatments. He would benefit from layer compression wraps however he refuses compression wraps or anything that smokes his legs. He expressed an inability to tolerate compression wraps. Juxta lite wraps have been ordered. He has been instructed on the appropriate application of the juxta leg wraps. His blood pressure today on visit was 200/120. He denies any blurred vision, chest pain, dizziness, shortness of breath, or difficulty with mobility. Mr. Wedemeyer stated that he had a recent visit with his primary care provider. At that time his carvedilol  was decreased from 25 mg daily to 12.5 mg daily. Strongly encouraged Mr. Reinig to seek medical attention today during visit. He declined transport for evaluation of elevated blood pressure. Encouraged him to contact his primary care provider today for  medication management. He stated that he would call his primary care doctor after wound visit today. Also encouraged him that if he experienced any symptoms of blurred vision, chest pain, shortness of breath to immediately seek medical attention. 07/17/18 on evaluation today patient's wound actually does seem to show signs of improvement based on the overall appearance of the wound bed today. There does not appear to show any signs of infection which is good news. There is no overall worsening which is also good news. He still has hyper granular tissue will use in the [employer] followed by [facility] Dressing this point. 07/31/18 on evaluation today patient's wound bed actually show signs of improvement with good epithelialization especially in the upper portion of the wound. He has been tolerating the dressing changes without complication in general. Overall I'm extremely happy with how things stand. No fevers, chills, nausea, or vomiting noted at this time. 08/28/18 on evaluation today patient appears to be doing a little better in regard to his lower extremity ulcer. With that being said it appears to be very hyper granular I think this is directly attributed to the fact that he continues to  use the [employer] underneath the Hydrofera Blue Dressing. Although this helps not to stick unfortunately also think he's not getting the benefit of the Hydrofera Blue Dressing particular and subsequently is causing too much moisture buildup hence the hyper granulation. Fortunately there does not appear to be any signs of infection at this time which is good news. 09/03/18 on evaluation today patient appears to be doing well in regard to his lower [name] ulcer. He has been tolerating the dressing changes without complication. Fortunately he has much less hyper granular tissue the noted last week I do think using silver nitrate in changing to using the Hydrofera Blue Dressing without the mepitel has made a big  difference for him this is good news. 09/18/18 on evaluation today patient appears to be doing excellent in regard to his right lower [name] ulcer. This is actually significantly smaller even compared to the last evaluation. Overall I'm very pleased in this regard. I'm a recommend that we likely repeat the silver nitrate today based on what I'm seeing. 10/02/18 on evaluation today patient appears to be doing excellent in regard to his lower extremity wound on the right. He's been tolerating the dressing changes without complication. Fortunately there's no signs of infection at this time. Overall been very pleased with how things seem to be progressing. No fevers, chills, nausea, or vomiting noted at this time. 10/16/18 on evaluation today patient actually appears to be doing much better in regard to his lower extremity wound on the right. This is shown signs of good improvement and is indeed measuring smaller he is on a excellent track as far as healing is concerned. My hope is this will be healed of the next several weeks. Fortunately there is no evidence of infection at this time. He does seem to be doing everything that I'm recommending for him 10/30/18 on evaluation today patient appears to be doing better in regard to his lower extremity ulcer. He's been tolerating the dressing changes without complication. Fortunately the Hydrofera Blue Dressing to be doing the job. He has made excellent progress. With that being said this is still going somewhat slow but nonetheless is always making good progress at this point. 5/18-Patient was in to be seen for right leg ulcer that appeared after scab above it was removed today. This area had healed completely. It appears that no further action is required on this READMISSION 01/02/2022 This is a now 68 year old male with poorly controlled type 2 diabetes mellitus and chronic venous insufficiency who once again was performing lawn care when a rock was flung up by  his weedeater and it struck him in the right lateral lower leg. The wound has not healed though it has been nearly 2 months since the injury occurred. He was seen in the emergency department at [facility] on May 9. At that time it was very painful and he described it as a 15 on a scale of 010. He also was found to have 3+ pitting edema to the bilateral lower extremities. He was prescribed a weeks course of Keflex . He is here today because the wound has failed to heal and he has a prior history in our clinic. Anthony Barnes, Anthony Barnes ([id number]) [id number] Page 3 of 10 ABI in clinic today was noncompressible. He did have formal vascular studies performed in 2019 which were normal. The last hemoglobin A1c I have available for review was from March 21, 2021 and was elevated at 7.7%. The wound is fairly small and circular located about  3 inches above his right lateral malleolus. It is completely covered with eschar. No surrounding erythema, no odor, no purulent drainage. 01/11/2022: The wound on his right lateral leg is a little bit smaller today. It has reaccumulated some slough and a small bit of eschar. It remains fairly painful. He has another area on his anterior tibia that he will not let me touch but I am concerned that there is a wound forming underneath the surface. 01/18/2022: Unfortunately, his wound is a little bit bigger today. He continues to accumulate slough on the wound surface. It is still quite tender. 01/25/2022: The patient bumped his wound on the car door which resulted in bleeding. He was seen at urgent care where it was redressed. Unfortunately, the wound is bigger again today. There is accumulated slough on the surface. Urgent care gave him some tramadol , apparently and he took this prior to his visit so he could tolerate debridement better. 02/02/2022: The wound is a little bit smaller today. The surface, however, has accumulated a fairly thick layer of  slough. The periwound skin is intact and there is no obvious sign of infection. 02/07/2022: The wound is a bit larger today. It has thick slough accumulation. The periwound skin is intact and there is no obvious erythema, induration, nor any odor. 02/16/2022: I took a culture last week due to the appearance of the wound and the patient's degree of pain. This grew out methicillin sensitive Staph aureus. Augmentin was prescribed. The patient states that the pain is markedly improved today. The wound also looks much better. It is smaller with less slough buildup and there is good granulation tissue emerging. 02/21/2022: The patient's pain is quite improved from prior visits. The wound is about the same size and has some accumulated slough on the surface. The base is a little bit fibrotic but there are some areas of granulation tissue filling in. 02/28/2022: The wound is a little bit narrower on its measurements but slightly longer. There is still some slough accumulation on the surface, but he has minimal pain and there is no concern for ongoing infection. Granulation tissue is emerging. 03/07/2022: During the week, he cut holes in his wrap because it was painful and too tight. As a result, his leg was quite a bit more swollen today and his wound was larger. The wound surface is fairly clean but a little bit dry. Minimal slough accumulation. Granulation tissue present. 03/14/2022: Once again, he did some arts and crafts projects with his wrap. On the other hand, however, his wound does look better. It is smaller today and is beginning to fill in. Minimal slough and a small amount of eschar accumulation. 03/27/2022: His wound measured bigger today, although it does not appear to be so on my impression. The wound continues to fill with granulation tissue. There is a little bit of slough and eschar accumulation. He continues to cut his wrap off of his feet and his edema control at the ankle is particularly poor  with 3+ pitting edema. His insurance will not cover any sort of skin substitute unfortunately. 04/04/2022: The wound is smaller today. It is more superficial with good granulation tissue and just a little bit of slough. He continues to cut away portions of his wrap which results in inadequate edema control. 04/11/2022: His wound measured a couple millimeters larger today. It continues to fill with granulation tissue and there is minimal slough on the surface. 04/19/2022: No significant change in the wound dimensions, but the surface  is healthier and less fibrotic. 04/26/2022: The wound is a little bit bigger again today. There is slough accumulation on the surface. Edema control is inadequate. He has been cutting the foot portion of his wrap off each week. 05/03/2022: The wound is a little smaller this week. There is still a fair amount of slough on the surface. His drainage was a little bit as greenish today, although not the blue-green typically associated with Pseudomonas. No significant odor. The underlying granulation tissue appears healthy. The culture that I took last week grew out methicillin sensitive Staph aureus. 05/10/2022: The wound is again a little bit smaller this week. He still has slough accumulation on the surface. Most of the surface has fairly decent granulation tissue, but there is still a fibrotic portion in the center near the caudal aspect of the wound. He did not pick up the Augmentin that I prescribed for his MSSA infection. He says he will get it today. 05/19/2022: His wound is smaller by half a centimeter today. There is a little bit of slough buildup, but less so than at previous visits. He has not been taking his Augmentin properly. He has been splitting the tablets in half and taking them on an irregular schedule. He says this is because it has been upsetting his stomach. 05/25/2022: His wound is a little bit smaller again this week. Very minimal slough accumulation. The  wound is drier than I would like to see. He completed his course of Augmentin. 10/19; patient has a wound on the right lateral lower leg in the setting of obvious chronic venous insufficiency. He has had a previous wound on the anterior leg that we dealt with several years ago. He has been using Hydrofera Blue. Kerlix and Coban 06/08/2022: The wound is smaller again this week. There is some light slough accumulation. It remains a little dry. 06/16/2022: Although the wound dimensions were the same this week, on visual inspection it appears smaller. It is clean, but still dry despite the use of hydrogel. 06/22/2022: His wound responded very well to endoform. It is smaller, moisture balance is excellent, and the surface has lovely granulation tissue. 06/29/2022: His wound is smaller again this week. The granulation tissue is a little bit hypertrophic. No concern for infection. 07/13/2022: His wound continues to improve. The open area is much smaller, but there is still some hypertrophic granulation tissue present. 07/21/2022: The wound is covered by a thick dark scab. Once this was removed, the majority of the wound was found to be healed, with just a couple of open areas with some slough on them. 12/14The patient comes in today with the area still scabbed over. He would not let me touch these. I explained the problem from a providers point of view about scabs on the wounds including the possibility of deep underlying wounds, superficial wounds that have not healed. There is a course of the possibility that everything is epithelialized under scab. He said he understood this but did not want these further debrided this was initially trauma in the setting of chronic venous insufficiency and lymphedema READMISSION 06/25/2023 This is a now 68 year old man who has had a wound on his left mid tibial area for about 2 months. He thinks this may have happened from hitting his leg on a bed frame although that  history is less than certain. He has not been keeping a specific dressing on the wound simply keeping it clean. We had him in clinic in [place], [place] ([id number]) [id number]  Page 4 of 10 2023 and before that on 2020 with traumatic wounds on the right leg. He was discharged with stockings although he has not been wearing these currently. Past medical history includes type 2 diabetes with peripheral neuropathy, chronic venous insufficiency, lymphedema and hypertension. ABI in our clinic was 1.3. Patient had arterial studies in August 2023 at which time on the left his ABI was 1.03 TBI of 0.78 with triphasic waveforms 12/5; area in the left mid tibia. Initially trauma in the setting of chronic venous insufficiency and lymphedema. We put him in [facility] with an early Urgo K2 compression when he was seen for the first time on 11/11. He said to weekly nurse visits since then. 12/19; left mid tibia initially trauma in the setting of chronic venous insufficiency and lymphedema. He had hypergranulation last week I therefore changed the dressing to Hydrofera Blue. He comes in today with a healthy looking wound bed and rims of epithelialization. 08/23/2023; patient presents for follow-up. He has been using Tubigrip and Hydrofera Blue to the wound bed. His wound has freshly epithelialized. He has no issues or complaints today. He has noted no drainage. He elected not to obtain The compression stockings Recommended at last clinic visit. Electronic Signature(s) Signed: 08/28/2023 12:04:19 PM By: Rosan Raisin DO Entered By: Rosan Raisin on 08/23/2023 09:41:58 -------------------------------------------------------------------------------- Physical Exam Details Patient Name: Date of Service: Anthony Barnes, Anthony [name] 08/23/2023 9:00 A M Medical Record Number: [id number] Patient Account Number: [id number] Date of Birth/Sex: Treating RN: Jun 16, 1956 (68 y.o. M) Primary Care Provider:  Delbert Clam Other Clinician: Referring Provider: Treating Provider/Extender: Rosan Raisin Delbert Clam Weeks in Treatment: 8 Constitutional respirations regular, non-labored and within target range for patient.. Cardiovascular 2+ dorsalis pedis/posterior tibialis pulses. Psychiatric pleasant and cooperative. Notes T the left lower extremity anterior aspect there is freshly epithelialization over previous wound site. 2+ pitting edema to the knee. No drainage noted. No signs o of infection. Electronic Signature(s) Signed: 08/28/2023 12:04:19 PM By: Rosan Raisin DO Entered By: Rosan Raisin on 08/23/2023 09:43:01 -------------------------------------------------------------------------------- Physician Orders Details Patient Name: Date of Service: Anthony Barnes, Anthony [name] 08/23/2023 9:00 A M Medical Record Number: [id number] Patient Account Number: [id number] Date of Birth/Sex: Treating RN: 1956/03/01 (68 y.o. NETTY Cammie Sailors Primary Care Provider: Delbert Clam Other Clinician: Referring Provider: Treating Provider/Extender: Rosan Raisin Delbert Clam Weeks in Treatment: 8 Verbal / Phone Orders: No Diagnosis Coding ICD-10 Coding Code Description [id number] Non-pressure chronic ulcer of other part of left lower leg with other specified severity LENN, VOLKER ([id number]) [phone] Page 5 of 10 [phone] Abrasion, left lower leg, subsequent encounter E11.622 Type 2 diabetes mellitus with other skin ulcer I87.312 Chronic venous hypertension (idiopathic) with ulcer of left lower extremity Discharge From [facility] - Congragulations on your wound healing! Continue to wear compression stockings or tubigrip daily Bathing/ Shower/ Hygiene May shower and wash wound with soap and water. Edema Control - Orders / Instructions Left Lower Extremity Elevate legs to the level of the heart or above for 30  minutes daily and/or when sitting for 3-4 times a day throughout the day. - prop feet up when sitting Avoid standing for long periods of time. Patient to wear own compression stockings every day. Exercise regularly Compression stocking or Garment 20-30 mm/Hg pressure to: - both legs Electronic Signature(s) Signed: 08/23/2023 5:11:58 PM By: Cammie Sailors RN, BSN Signed: 08/28/2023 12:04:19 PM By: Rosan Raisin DO Entered By: Cammie Sailors on 08/23/2023 09:43:24 -------------------------------------------------------------------------------- Problem  List Details Patient Name: Date of Service: Anthony GERDA CONES [name] 08/23/2023 9:00 A M Medical Record Number: [id number] Patient Account Number: [id number] Date of Birth/Sex: Treating RN: 1955/10/03 (68 y.o. M) Primary Care Provider: Delbert Clam Other Clinician: Referring Provider: Treating Provider/Extender: Rosan Harlene Delbert Clam Weeks in Treatment: 8 Active Problems ICD-10 Encounter Code Description Active Date MDM Diagnosis L97.828 Non-pressure chronic ulcer of other part of left lower leg with other specified 06/25/2023 No Yes severity S80.812D Abrasion, left lower leg, subsequent encounter 06/25/2023 No Yes E11.622 Type 2 diabetes mellitus with other skin ulcer 06/25/2023 No Yes I87.312 Chronic venous hypertension (idiopathic) with ulcer of left lower extremity 06/25/2023 No Yes Inactive Problems Resolved Problems Electronic Signature(s) Signed: 08/28/2023 12:04:19 PM By: Rosan Harlene DO Entered By: Rosan Harlene on 08/23/2023 09:40:40 ELLENA CARLIN BRAVO ([id number]) [id number] Page 6 of 10 -------------------------------------------------------------------------------- Progress Note Details Patient Name: Date of Service: Anthony GERDA CONES [name] 08/23/2023 9:00 A M Medical Record Number: [id number] Patient Account Number: [id number] Date of Birth/Sex: Treating RN: 1955-08-23 (68 y.o.  M) Primary Care Provider: Delbert Clam Other Clinician: Referring Provider: Treating Provider/Extender: Rosan Harlene Delbert Clam Weeks in Treatment: 8 Subjective Chief Complaint Information obtained from Patient Right LE Ulcer 06/25/2023; patient returns to clinic with a wound on his left anterior mid tibia History of Present Illness (HPI) ADMISSION 03/12/18 This is a 68 year old and it was a type II diabetic reasonably poorly controlled with a recent hemoglobin A1c of 8.2. He was tending to his lawn on 01/15/18 when his weed [employer] sent a rock up word hitting him in the right anterior leg. He was seen in his primary M.D. office is same time. An x-ray showed no abnormality. He was given a course of cephalexin . Since then he's been applying peroxide and topical antibiotics to the wound. He does not have a history of chronic wounds however he does have chronic edema in the lower legs. He is complaining of pain in the right leg wound but no real history of claudication. Past medical history includes type 2 diabetes, hypertension, morbid obesity. ABIs in the right leg were noncompressible 03/19/18 on evaluation today patient appears to be tolerating the wrap in general fairly well. He states he's not having that much pain in regard to the wound itself. With that being said he is having some issues with slough buildup on the surface of the wound the Iodoflex does seem to be helpful in that regard. He is still having discomfort he wonders if I can send in a prescription for ibuprofen  or something of like to help him out. I do believe that the prox end may be of benefit for him. 03/25/18 on evaluation today patient appears to be doing very well in regard to his right lateral lower [name]. He's been tolerating the dressing changes without complication that is the wraps. Nonetheless he does continue to have pain he states is been dreading the possibility of having to  have debridement again today. His first debridement experience was not optimal. With that being said he does seem to be showing signs of improvement there does appear to be little bit more granulation he does have a lot of slough that really does need to breeding away. 04/04/18;the patient went for arterial studies that were really quite normal. ABI on the right at 1.19 with triphasic waveforms on the left 1.11 with triphasic waveforms. TBI 0.93 on the right and 0.95 on the left. This suggests T should be able  to tolerate even 4 layer compression. He is however complaining of a lot of pain with our wraps I'm wondering whether the pain is simply Iodoflex. I changed him to Santyl covering Hydrofera Blue. I'm still going to try to keep him in 3 layer compression 04/12/18 on evaluation today patient actually appears to be doing rather well in regard to his right lower [name] ulcer. He is making good progress although it is slow still I do believe that the Santyl is much better than the Iodoflex. The good news is the patient seems to be tolerating the dressing changes without complication now that we've gotten good of the Iodoflex which really did burn him quite significantly. Otherwise there's no evidence of infection. 04/17/18 on evaluation today patient actually appears to be showing signs of improvement in regard to the ulcer. Unfortunately he's had somewhat of a rough day due to the fact that he found out that one of his friends suddenly and unexpectedly passed today. He states he's not sure he's in the frame of mind to allow me to debride the wound at this point. Nonetheless I do feel like he is showing signs of the wound getting better little by little each week. 04/24/18 on evaluation today patient's ulcer actually appears to be showing some signs of improvement albeit slow. He has been tolerating the dressing changes without complication except for the wrap which she states was so tight that he had to  remove it it was causing a lot of discomfort. Fortunately there is no evidence of infection at this point. He states he only wore the wrap until about the time he got home last week and that he had to take it off. Nonetheless he does not have any other compression stockings, Juxta-Lite wraps, or anything otherwise to really help at this point as far as compression is concerned. 05/01/18 on evaluation today patient actually appears to be doing fairly well in regard to his lower extremity ulcer. He has been tolerating the dressing changes currently without complication. The wrap does seem to be helping as far as the fluid management is concerned. Wound bed itself actually show signs of good improvement although there is some Slough noted there's not as much as previous and he actually has fairly decent granulation noted as well. Overall I'm pleased with the progress of to this point. The patient would prefer not to have any debridement today he states he's actually not feeling too well in general and he really does not want any additional discomfort typically debridement is fairly uncomfortable for him. 05/08/18 on evaluation today patient actually appears to be doing a little better in regard to his lower extremity ulcer. He has been tolerating the dressing changes without complication. With that being said I'm actually quite pleased with the fact the wound is not appear to be infected and in general has made a little progress. With that being said I do believe we need to perform some debridement to clean away the necrotic tissue on the surface of the wound. 05/15/18 on evaluation today patient actually appears to be doing a little better in regard to his wound. Fortunately there is some improvement noted fortunately there's also no significant evidence of infection at this point in time. He does have continued pain that really nothing different than previous he did get his Juxta-Lite wrap. 05/29/18 on  evaluation today patient actually appears to be doing somewhat better in regard to his wound currently. He's been tolerating the dressing changes without  complication. With that being said he has been performing the Hydrofera Blue Dressing changes every day at home. I'm pretty sure that we told him every other day last week or when I saw him rather two weeks ago. With that being said obviously did not hurt to do it daily just that obviously is gonna run out of supplies sooner and that could get him into trouble as far as his insurance is concerned. Nonetheless he at this point still continues to have pain although honestly I don't feel like it's as bad as what it was in the past just based on his reactions at this point. 06/12/18 on evaluation today patient actually appears to be doing better in regard to his lower [name] ulcer. He's been tolerating the dressing changes without complication. Fortunately there does not appear to be any evidence of infection at this time. No fevers, chills, nausea, or vomiting noted at this time. 06/19/18 evaluation today patient's ulcer on the lower extremity appears to be doing better at this point. he has been tolerating the dressing changes. The patient Anthony Barnes, Anthony Barnes ([id number]) [id number] Page 7 of 10 seems to be somewhat depressed about the progress of his wound although the overall appearance at this time seems to be doing well. In general I'm very pleased with the appearance today he does have some biofilm on the surface of the wound as well as of hyper granular tissue we may want to utilize a little bit of silver nitrate today. 06/27/2018; patient comes into clinic today for a wound on the right lateral calf likely related to chronic venous insufficiency. He has been using medihoney covered with Hydrofera Blue. Wound surface actually looks quite good 07/03/2018 seen today for follow-up and management of lower extremity ulcer  right lateral shin. T olerating dressing changes. He has a small amount of biofilm today. Will attempt to remove a portion of the biofilm as he tolerates. He becomes very anxious with wound treatments. He would benefit from layer compression wraps however he refuses compression wraps or anything that smokes his legs. He expressed an inability to tolerate compression wraps. Juxta lite wraps have been ordered. He has been instructed on the appropriate application of the juxta leg wraps. His blood pressure today on visit was 200/120. He denies any blurred vision, chest pain, dizziness, shortness of breath, or difficulty with mobility. Mr. More stated that he had a recent visit with his primary care provider. At that time his carvedilol  was decreased from 25 mg daily to 12.5 mg daily. Strongly encouraged Mr. Dupras to seek medical attention today during visit. He declined transport for evaluation of elevated blood pressure. Encouraged him to contact his primary care provider today for medication management. He stated that he would call his primary care doctor after wound visit today. Also encouraged him that if he experienced any symptoms of blurred vision, chest pain, shortness of breath to immediately seek medical attention. 07/17/18 on evaluation today patient's wound actually does seem to show signs of improvement based on the overall appearance of the wound bed today. There does not appear to show any signs of infection which is good news. There is no overall worsening which is also good news. He still has hyper granular tissue will use in the [employer] followed by [facility] Dressing this point. 07/31/18 on evaluation today patient's wound bed actually show signs of improvement with good epithelialization especially in the upper portion of the wound. He has been tolerating the dressing changes without complication  in general. Overall I'm extremely happy with how things stand. No fevers, chills,  nausea, or vomiting noted at this time. 08/28/18 on evaluation today patient appears to be doing a little better in regard to his lower extremity ulcer. With that being said it appears to be very hyper granular I think this is directly attributed to the fact that he continues to use the [employer] underneath the Hydrofera Blue Dressing. Although this helps not to stick unfortunately also think he's not getting the benefit of the Hydrofera Blue Dressing particular and subsequently is causing too much moisture buildup hence the hyper granulation. Fortunately there does not appear to be any signs of infection at this time which is good news. 09/03/18 on evaluation today patient appears to be doing well in regard to his lower [name] ulcer. He has been tolerating the dressing changes without complication. Fortunately he has much less hyper granular tissue the noted last week I do think using silver nitrate in changing to using the Hydrofera Blue Dressing without the mepitel has made a big difference for him this is good news. 09/18/18 on evaluation today patient appears to be doing excellent in regard to his right lower [name] ulcer. This is actually significantly smaller even compared to the last evaluation. Overall I'm very pleased in this regard. I'm a recommend that we likely repeat the silver nitrate today based on what I'm seeing. 10/02/18 on evaluation today patient appears to be doing excellent in regard to his lower extremity wound on the right. He's been tolerating the dressing changes without complication. Fortunately there's no signs of infection at this time. Overall been very pleased with how things seem to be progressing. No fevers, chills, nausea, or vomiting noted at this time. 10/16/18 on evaluation today patient actually appears to be doing much better in regard to his lower extremity wound on the right. This is shown signs of good improvement and is indeed measuring smaller he is on a excellent  track as far as healing is concerned. My hope is this will be healed of the next several weeks. Fortunately there is no evidence of infection at this time. He does seem to be doing everything that I'm recommending for him 10/30/18 on evaluation today patient appears to be doing better in regard to his lower extremity ulcer. He's been tolerating the dressing changes without complication. Fortunately the Hydrofera Blue Dressing to be doing the job. He has made excellent progress. With that being said this is still going somewhat slow but nonetheless is always making good progress at this point. 5/18-Patient was in to be seen for right leg ulcer that appeared after scab above it was removed today. This area had healed completely. It appears that no further action is required on this READMISSION 01/02/2022 This is a now 68 year old male with poorly controlled type 2 diabetes mellitus and chronic venous insufficiency who once again was performing lawn care when a rock was flung up by his weedeater and it struck him in the right lateral lower leg. The wound has not healed though it has been nearly 2 months since the injury occurred. He was seen in the emergency department at [facility] on May 9. At that time it was very painful and he described it as a 15 on a scale of 010. He also was found to have 3+ pitting edema to the bilateral lower extremities. He was prescribed a weeks course of Keflex . He is here today because the wound has failed to heal and  he has a prior history in our clinic. ABI in clinic today was noncompressible. He did have formal vascular studies performed in 2019 which were normal. The last hemoglobin A1c I have available for review was from March 21, 2021 and was elevated at 7.7%. The wound is fairly small and circular located about 3 inches above his right lateral malleolus. It is completely covered with eschar. No surrounding erythema, no odor, no purulent drainage. 01/11/2022: The  wound on his right lateral leg is a little bit smaller today. It has reaccumulated some slough and a small bit of eschar. It remains fairly painful. He has another area on his anterior tibia that he will not let me touch but I am concerned that there is a wound forming underneath the surface. 01/18/2022: Unfortunately, his wound is a little bit bigger today. He continues to accumulate slough on the wound surface. It is still quite tender. 01/25/2022: The patient bumped his wound on the car door which resulted in bleeding. He was seen at urgent care where it was redressed. Unfortunately, the wound is bigger again today. There is accumulated slough on the surface. Urgent care gave him some tramadol , apparently and he took this prior to his visit so he could tolerate debridement better. 02/02/2022: The wound is a little bit smaller today. The surface, however, has accumulated a fairly thick layer of slough. The periwound skin is intact and there is no obvious sign of infection. 02/07/2022: The wound is a bit larger today. It has thick slough accumulation. The periwound skin is intact and there is no obvious erythema, induration, nor any odor. 02/16/2022: I took a culture last week due to the appearance of the wound and the patient's degree of pain. This grew out methicillin sensitive Staph aureus. Augmentin was prescribed. The patient states that the pain is markedly improved today. The wound also looks much better. It is smaller with less slough buildup and there is good granulation tissue emerging. 02/21/2022: The patient's pain is quite improved from prior visits. The wound is about the same size and has some accumulated slough on the surface. The base is a little bit fibrotic but there are some areas of granulation tissue filling in. 02/28/2022: The wound is a little bit narrower on its measurements but slightly longer. There is still some slough accumulation on the surface, but he has minimal pain and there  is no concern for ongoing infection. Granulation tissue is emerging. 03/07/2022: During the week, he cut holes in his wrap because it was painful and too tight. As a result, his leg was quite a bit more swollen today and his wound was larger. The wound surface is fairly clean but a little bit dry. Minimal slough accumulation. Granulation tissue present. Anthony Barnes, Anthony Barnes ([id number]) [id number] Page 8 of 10 03/14/2022: Once again, he did some arts and crafts projects with his wrap. On the other hand, however, his wound does look better. It is smaller today and is beginning to fill in. Minimal slough and a small amount of eschar accumulation. 03/27/2022: His wound measured bigger today, although it does not appear to be so on my impression. The wound continues to fill with granulation tissue. There is a little bit of slough and eschar accumulation. He continues to cut his wrap off of his feet and his edema control at the ankle is particularly poor with 3+ pitting edema. His insurance will not cover any sort of skin substitute unfortunately. 04/04/2022: The wound is smaller today. It is  more superficial with good granulation tissue and just a little bit of slough. He continues to cut away portions of his wrap which results in inadequate edema control. 04/11/2022: His wound measured a couple millimeters larger today. It continues to fill with granulation tissue and there is minimal slough on the surface. 04/19/2022: No significant change in the wound dimensions, but the surface is healthier and less fibrotic. 04/26/2022: The wound is a little bit bigger again today. There is slough accumulation on the surface. Edema control is inadequate. He has been cutting the foot portion of his wrap off each week. 05/03/2022: The wound is a little smaller this week. There is still a fair amount of slough on the surface. His drainage was a little bit as greenish today, although not the blue-green  typically associated with Pseudomonas. No significant odor. The underlying granulation tissue appears healthy. The culture that I took last week grew out methicillin sensitive Staph aureus. 05/10/2022: The wound is again a little bit smaller this week. He still has slough accumulation on the surface. Most of the surface has fairly decent granulation tissue, but there is still a fibrotic portion in the center near the caudal aspect of the wound. He did not pick up the Augmentin that I prescribed for his MSSA infection. He says he will get it today. 05/19/2022: His wound is smaller by half a centimeter today. There is a little bit of slough buildup, but less so than at previous visits. He has not been taking his Augmentin properly. He has been splitting the tablets in half and taking them on an irregular schedule. He says this is because it has been upsetting his stomach. 05/25/2022: His wound is a little bit smaller again this week. Very minimal slough accumulation. The wound is drier than I would like to see. He completed his course of Augmentin. 10/19; patient has a wound on the right lateral lower leg in the setting of obvious chronic venous insufficiency. He has had a previous wound on the anterior leg that we dealt with several years ago. He has been using Hydrofera Blue. Kerlix and Coban 06/08/2022: The wound is smaller again this week. There is some light slough accumulation. It remains a little dry. 06/16/2022: Although the wound dimensions were the same this week, on visual inspection it appears smaller. It is clean, but still dry despite the use of hydrogel. 06/22/2022: His wound responded very well to endoform. It is smaller, moisture balance is excellent, and the surface has lovely granulation tissue. 06/29/2022: His wound is smaller again this week. The granulation tissue is a little bit hypertrophic. No concern for infection. 07/13/2022: His wound continues to improve. The open area is much  smaller, but there is still some hypertrophic granulation tissue present. 07/21/2022: The wound is covered by a thick dark scab. Once this was removed, the majority of the wound was found to be healed, with just a couple of open areas with some slough on them. 12/14The patient comes in today with the area still scabbed over. He would not let me touch these. I explained the problem from a providers point of view about scabs on the wounds including the possibility of deep underlying wounds, superficial wounds that have not healed. There is a course of the possibility that everything is epithelialized under scab. He said he understood this but did not want these further debrided this was initially trauma in the setting of chronic venous insufficiency and lymphedema READMISSION 06/25/2023 This is a now 68 year old  man who has had a wound on his left mid tibial area for about 2 months. He thinks this may have happened from hitting his leg on a bed frame although that history is less than certain. He has not been keeping a specific dressing on the wound simply keeping it clean. We had him in clinic in 2023 and before that on 2020 with traumatic wounds on the right leg. He was discharged with stockings although he has not been wearing these currently. Past medical history includes type 2 diabetes with peripheral neuropathy, chronic venous insufficiency, lymphedema and hypertension. ABI in our clinic was 1.3. Patient had arterial studies in August 2023 at which time on the left his ABI was 1.03 TBI of 0.78 with triphasic waveforms 12/5; area in the left mid tibia. Initially trauma in the setting of chronic venous insufficiency and lymphedema. We put him in [place] with an early Urgo K2 compression when he was seen for the first time on 11/11. He said to weekly nurse visits since then. 12/19; left mid tibia initially trauma in the setting of chronic venous insufficiency and lymphedema. He had hypergranulation  last week I therefore changed the dressing to Hydrofera Blue. He comes in today with a healthy looking wound bed and rims of epithelialization. 08/23/2023; patient presents for follow-up. He has been using Tubigrip and Hydrofera Blue to the wound bed. His wound has freshly epithelialized. He has no issues or complaints today. He has noted no drainage. He elected not to obtain The compression stockings Recommended at last clinic visit. Objective Constitutional respirations regular, non-labored and within target range for patient.. Vitals Time Taken: 9:13 AM, Temperature: 98.1 F, Pulse: 65 bpm, Respiratory Rate: 18 breaths/min, Blood Pressure: 167/104 mmHg. Cardiovascular Anthony Barnes, Anthony Barnes ([id number]) [phone].pdf Page 9 of 10 2+ dorsalis pedis/posterior tibialis pulses. Psychiatric pleasant and cooperative. General Notes: T the left lower extremity anterior aspect there is freshly epithelialization over previous wound site. 2+ pitting edema to the knee. No drainage o noted. No signs of infection. Integumentary (Hair, Skin) Wound #3 status is Healed - Epithelialized. Original cause of wound was Trauma. The date acquired was: 04/17/2023. The wound has been in treatment 8 weeks. The wound is located on the Left,Anterior Lower Leg. The wound measures 0cm length x 0cm width x 0cm depth; 0cm^2 area and 0cm^3 volume. There is no tunneling or undermining noted. There is a none present amount of drainage noted. The wound margin is distinct with the outline attached to the wound base. There is no granulation within the wound bed. There is no necrotic tissue within the wound bed. The periwound skin appearance did not exhibit: Callus, Crepitus, Excoriation, Induration, Rash, Scarring, Dry/Scaly, Maceration, Atrophie Blanche, Cyanosis, Ecchymosis, Hemosiderin Staining, Mottled, Pallor, Rubor, Erythema. Periwound temperature was noted as No Abnormality. 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 His wound is healed. I did recommend compression stockings daily however he states he has a hard time putting these on. Also recommended he elevate his legs when sitting. He can use Tubigrip daily for the next 2 weeks to assure that the wound remains closed. He knows to call with any questions or concerns if he reopens. Plan 1. Tubigripdaily for the next 2 weeks 2. Follow-up as needed  3. Discharge from clinic due to closed wound Electronic Signature(s) Signed: 08/28/2023 12:04:19 PM By: Rosan Raisin DO Entered By: Rosan Raisin on 08/23/2023 09:44:51 -------------------------------------------------------------------------------- SuperBill Details Patient Name: Date of Service: Anthony Barnes, Anthony [name] 08/23/2023 Medical Record Number: [id number] Patient Account Number: [id number] Date of Birth/Sex: Treating RN: [birth year] (68 y.o. M) Primary Care Provider: Delbert Clam Other Clinician: Referring Provider: Treating Provider/Extender: Rosan Raisin Delbert Clam Weeks in Treatment: 8 Diagnosis Coding ICD-10 Codes Code Description [phone] Non-pressure chronic ulcer of other part of left lower leg with other specified severity S80.812D Abrasion, left lower leg, subsequent encounter E11.622 Type 2 diabetes mellitus with other skin ulcer I87.312 Chronic venous hypertension (idiopathic) with ulcer of left lower extremity Physician Procedures GOTHAM, RADEN ([id number]): CPT4 Code Description [id number] [phone] - WC PHYS LEVEL 3 - EST PT ICD-10 Diagnosis Description L97.828 Non-pressure chronic ulcer of other part of left lower leg with o S80.812D Abrasion,  left lower leg, subsequent encounter  E11.622 Type 2 diabetes mellitus with other skin ulcer I87.312 Chronic venous hypertension (idiopathic) with ulcer of left lower [id number] Page 10 of 10: Quantity Modifier 1 ther specified severity extremity Electronic Signature(s) Signed: 08/28/2023 12:04:19 PM By: Rosan Raisin DO Entered By: Rosan Raisin on 08/23/2023 09:45:03

## 2023-08-29 NOTE — Progress Notes (Signed)
 BASTIEN, STRAWSER ([id number]) [id number] Page 1 of 6 Visit Report for 08/23/2023 Arrival Information Details Patient Name: Date of Service: Anthony GERDA CONES [name] 08/23/2023 9:00 A M Medical Record Number: [id number] Patient Account Number: [id number] Date of Birth/Sex: Treating RN: 29-Jun-1956 (68 y.o. Anthony Barnes Primary Care Adonai Helzer: Delbert Clam Other Clinician: Referring Pearlean Sabina: Treating Fletcher Ostermiller/Extender: Rosan Harlene Delbert Clam Weeks in Treatment: 8 Visit Information History Since Last Visit Added or deleted any medications: No Patient Arrived: Ambulatory Any new allergies or adverse reactions: No Arrival Time: 09:07 Had a fall or experienced change in No Accompanied By: self activities of daily living that may affect Transfer Assistance: None risk of falls: Patient Identification Verified: Yes Signs or symptoms of abuse/neglect since last visito No Secondary Verification Process Completed: Yes Hospitalized since last visit: No Patient Requires Transmission-Based Precautions: No Implantable device outside of the clinic excluding No Patient Has Alerts: No cellular tissue based products placed in the center since last visit: Has Dressing in Place as Prescribed: Yes Has Compression in Place as Prescribed: Yes Pain Present Now: No Electronic Signature(s) Signed: 08/23/2023 5:11:58 PM By: Cammie Sailors RN, BSN Entered By: Cammie Barnes on 08/23/2023 09:11:48 -------------------------------------------------------------------------------- Encounter Discharge Information Details Patient Name: Date of Service: Anthony GERDA, Anthony [name] 08/23/2023 9:00 A M Medical Record Number: [id number] Patient Account Number: [id number] Date of Birth/Sex: Treating RN: Oct 15, 1955 (68 y.o. Anthony Barnes Primary Care Amedeo Detweiler: Delbert Clam Other Clinician: Referring Sharnay Cashion: Treating Alzena Gerber/Extender: Rosan Harlene Delbert Clam Weeks in  Treatment: 8 Encounter Discharge Information Items Discharge Condition: Stable Ambulatory Status: Ambulatory Discharge Destination: Home Transportation: Private Auto Accompanied By: self Schedule Follow-up Appointment: Yes Clinical Summary of Care: Patient Declined Electronic Signature(s) Signed: 08/23/2023 5:11:58 PM By: Cammie Sailors RN, BSN Entered By: Cammie Barnes on 08/23/2023 09:46:07 Anthony Barnes ([id number]) [id number] Page 2 of 6 -------------------------------------------------------------------------------- Lower Extremity Assessment Details Patient Name: Date of Service: Anthony GERDA CONES [name] 08/23/2023 9:00 A M Medical Record Number: [id number] Patient Account Number: [id number] Date of Birth/Sex: Treating RN: Oct 27, 1955 (68 y.o. Anthony Barnes Primary Care Wasyl Dornfeld: Delbert Clam Other Clinician: Referring Asia Dusenbury: Treating Cedric Mcclaine/Extender: Rosan Harlene Delbert Clam Weeks in Treatment: 8 Edema Assessment Assessed: [Left: No] [Right: No] Edema: [Left: Ye] [Right: s] Calf Left: Right: Point of Measurement: From Medial Instep 41.5 cm Ankle Left: Right: Point of Measurement: From Medial Instep 5.7 cm Vascular Assessment Pulses: Dorsalis Pedis Palpable: [Left:Yes] Extremity colors, hair growth, and conditions: Extremity Color: [Left:Normal] Hair Growth on Extremity: [Left:No] Temperature of Extremity: [Left:Warm] Capillary Refill: [Left:> 3 seconds] Dependent Rubor: [Left:No Yes] Electronic Signature(s) Signed: 08/23/2023 5:11:58 PM By: Cammie Sailors RN, BSN Entered By: Cammie Barnes on 08/23/2023 09:14:40 -------------------------------------------------------------------------------- Multi Wound Chart Details Patient Name: Date of Service: Anthony GERDA, Anthony [name] 08/23/2023 9:00 A M Medical Record Number: [id number] Patient Account Number: [id number] Date of Birth/Sex: Treating RN: 20-Jan-1956 (68 y.o. M) Primary  Care Hercules Hasler: Delbert Clam Other Clinician: Referring Brysun Eschmann: Treating Stormey Wilborn/Extender: Rosan Harlene Delbert Clam Weeks in Treatment: 8 Vital Signs Height(in): Pulse(bpm): 65 Weight(lbs): Blood Pressure(mmHg): 167/104 Body Mass Index(BMI): Temperature(F): 98.1 Respiratory Rate(breaths/min): 18 [3:Photos:] [N/A:N/A] Left, Anterior Lower Leg N/A N/A Wound Location: Trauma N/A N/A Wounding Event: Abrasion N/A N/A Primary Etiology: Diabetic Wound/Ulcer of the Lower N/A N/A Secondary Etiology: Extremity Hypertension, Type II Diabetes N/A N/A Comorbid History: 04/17/2023 N/A N/A Date Acquired: 8 N/A N/A Weeks of Treatment: Healed - Epithelialized N/A N/A Wound Status: No N/A N/A Wound Recurrence: 0x0x0 N/A N/A Measurements L  x W x D (cm) 0 N/A N/A A (cm) : rea 0 N/A N/A Volume (cm) : 100.00% N/A N/A % Reduction in Area: 100.00% N/A N/A % Reduction in Volume: Full Thickness Without Exposed N/A N/A Classification: Support Structures None Present N/A N/A Exudate Amount: Distinct, outline attached N/A N/A Wound Margin: None Present (0%) N/A N/A Granulation Amount: None Present (0%) N/A N/A Necrotic Amount: Fascia: No N/A N/A Exposed Structures: Fat Layer (Subcutaneous Tissue): No Tendon: No Muscle: No Joint: No Bone: No Large (67-100%) N/A N/A Epithelialization: Excoriation: No N/A N/A Periwound Skin Texture: Induration: No Callus: No Crepitus: No Rash: No Scarring: No Maceration: No N/A N/A Periwound Skin Moisture: Dry/Scaly: No Atrophie Blanche: No N/A N/A Periwound Skin Color: Cyanosis: No Ecchymosis: No Erythema: No Hemosiderin Staining: No Mottled: No Pallor: No Rubor: No No Abnormality N/A N/A Temperature: Treatment Notes Electronic Signature(s) Signed: 08/28/2023 12:04:19 PM By: Rosan Raisin DO Entered By: Rosan Raisin on 08/23/2023  09:40:46 -------------------------------------------------------------------------------- Multi-Disciplinary Care Plan Details Patient Name: Date of Service: Anthony Barnes, Anthony [name] 08/23/2023 9:00 A M Medical Record Number: [id number] Patient Account Number: [id number] Date of Birth/Sex: Treating RN: [birth year] (68 y.o. Anthony Barnes Primary Care Halil Rentz: Delbert Clam Other Clinician: Referring Pama Roskos: Treating Linkin Vizzini/Extender: Rosan Raisin Delbert Clam Weeks in Treatment: [address] DUTCH, ING ([id number]) [id number] Page 4 of 6 Electronic Signature(s) Signed: 08/23/2023 5:11:58 PM By: Cammie Sailors RN, BSN Entered By: Cammie Barnes on 08/23/2023 09:39:27 -------------------------------------------------------------------------------- Pain Assessment Details Patient Name: Date of Service: Anthony Barnes, Anthony [name] 08/23/2023 9:00 A M Medical Record Number: [id number] Patient Account Number: [id number] Date of Birth/Sex: Treating RN: 12/25/1955 (68 y.o. Anthony Barnes Primary Care Bonham Zingale: Delbert Clam Other Clinician: Referring Kinzie Wickes: Treating Constantina Laseter/Extender: Rosan Raisin Delbert Clam Weeks in Treatment: 8 Active Problems Location of Pain Severity and Description of Pain Patient Has Paino No Site Locations Pain Management and Medication Current Pain Management: Electronic Signature(s) Signed: 08/23/2023 5:11:58 PM By: Cammie Sailors RN, BSN Entered By: Cammie Barnes on 08/23/2023 09:14:00 -------------------------------------------------------------------------------- Patient/Caregiver Education Details Patient Name: Date of Service: Anthony Barnes, Anthony [name] 1/9/2025andnbsp9:00 A M Medical Record Number: [id number] Patient Account Number: [id number] Date of Birth/Gender: Treating RN: 04-May-1956 (68 y.o. Anthony Barnes Primary Care Physician: Delbert Clam Other Clinician: Referring Physician: Treating  Physician/Extender: Rosan Raisin Delbert Clam Weeks in Treatment: 8 Education Assessment Education Provided To: Patient Anthony Barnes, Anthony Barnes ([id number]) [id number] Page 5 of 6 Education Topics Provided Wound/Skin Impairment: Methods: Explain/Verbal Responses: State content correctly Electronic Signature(s) Signed: 08/23/2023 5:11:58 PM By: Cammie Sailors RN, BSN Entered By: Cammie Barnes on 08/23/2023 09:45:14 -------------------------------------------------------------------------------- Wound Assessment Details Patient Name: Date of Service: Anthony Barnes, Anthony [name] 08/23/2023 9:00 A M Medical Record Number: [id number] Patient Account Number: [id number] Date of Birth/Sex: Treating RN: 27-Jul-1956 (68 y.o. Anthony Barnes Primary Care Ines Warf: Delbert Clam Other Clinician: Referring Christabel Camire: Treating Lucah Petta/Extender: Rosan Raisin Delbert Clam Weeks in Treatment: 8 Wound Status Wound Number: 3 Primary Etiology: Abrasion Wound Location: Left, Anterior Lower Leg Secondary Etiology: Diabetic Wound/Ulcer of the Lower Extremity Wounding Event: Trauma Wound Status: Healed - Epithelialized Date Acquired: 04/17/2023 Comorbid History: Hypertension, Type II Diabetes Weeks Of Treatment: 8 Clustered Wound: No Photos Wound Measurements Length: (cm) Width: (cm) Depth: (cm) Area: (cm) Volume: (cm) 0 % Reduction in Area: 100% 0 % Reduction in Volume: 100% 0 Epithelialization: Large (67-100%) 0 Tunneling: No 0 Undermining: No Wound Description Classification: Full Thickness Without Exposed Support Structures Wound Margin: Distinct, outline attached  Exudate Amount: None Present Foul Odor After Cleansing: No Slough/Fibrino No Wound Bed Granulation Amount: None Present (0%) Exposed Structure Necrotic Amount: None Present (0%) Fascia Exposed: No Fat Layer (Subcutaneous Tissue) Exposed: No Tendon Exposed: No Muscle Exposed: No Joint  Exposed: No Bone Exposed: No [address] Anthony Barnes, Anthony Barnes ([id number]) [id number] Page 6 of 6 No Abnormalities Noted: No No Abnormalities Noted: No Callus: No Atrophie Blanche: No Crepitus: No Cyanosis: No Excoriation: No Ecchymosis: No Induration: No Erythema: No Rash: No Hemosiderin Staining: No Scarring: No Mottled: No Pallor: No Moisture Rubor: No No Abnormalities Noted: No Dry / Scaly: No Temperature / Pain Maceration: No Temperature: No Abnormality Electronic Signature(s) Signed: 08/23/2023 5:11:58 PM By: Cammie Sailors RN, BSN Entered By: Cammie Barnes on 08/23/2023 09:39:10 -------------------------------------------------------------------------------- Vitals Details Patient Name: Date of Service: Anthony Barnes, Anthony [name] 08/23/2023 9:00 A M Medical Record Number: [id number] Patient Account Number: [id number] Date of Birth/Sex: Treating RN: [birth year] (68 y.o. Anthony Barnes Primary Care Laddie Naeem: Delbert Clam Other Clinician: Referring Ameria Sanjurjo: Treating Batu Cassin/Extender: Rosan Harlene Delbert Clam Weeks in Treatment: 8 Vital Signs Time Taken: 09:13 Temperature (F): 98.1 Pulse (bpm): 65 Respiratory Rate (breaths/min): 18 Blood Pressure (mmHg): 167/104 Reference Range: 80 - 120 mg / dl Electronic Signature(s) Signed: 08/23/2023 5:11:58 PM By: Cammie Sailors RN, BSN Entered By: Cammie Barnes on 08/23/2023 09:13:53

## 2023-10-02 ENCOUNTER — Ambulatory Visit: Payer: Self-pay | Admitting: Family Medicine

## 2023-11-09 ENCOUNTER — Other Ambulatory Visit: Payer: Self-pay | Admitting: Family Medicine

## 2023-11-09 DIAGNOSIS — E1159 Type 2 diabetes mellitus with other circulatory complications: Secondary | ICD-10-CM

## 2023-11-14 ENCOUNTER — Other Ambulatory Visit: Payer: Self-pay | Admitting: Family Medicine

## 2023-11-14 ENCOUNTER — Ambulatory Visit: Payer: Self-pay | Admitting: Family Medicine

## 2023-11-14 DIAGNOSIS — I152 Hypertension secondary to endocrine disorders: Secondary | ICD-10-CM

## 2023-11-15 ENCOUNTER — Ambulatory Visit: Payer: Self-pay | Attending: Family Medicine | Admitting: Family Medicine

## 2023-11-15 ENCOUNTER — Encounter: Payer: Self-pay | Admitting: Family Medicine

## 2023-11-15 VITALS — BP 153/94 | HR 69 | Ht 76.0 in | Wt 307.4 lb

## 2023-11-15 DIAGNOSIS — I152 Hypertension secondary to endocrine disorders: Secondary | ICD-10-CM

## 2023-11-15 DIAGNOSIS — I10 Essential (primary) hypertension: Secondary | ICD-10-CM

## 2023-11-15 DIAGNOSIS — R400 Somnolence: Secondary | ICD-10-CM

## 2023-11-15 NOTE — Patient Instructions (Signed)
 VISIT SUMMARY:  During your visit, we discussed your concerns about feeling excessively sleepy, particularly after taking your morning medications. We reviewed your current medication regimen and made some adjustments to help manage your symptoms and blood pressure. We also talked about the importance of incorporating more physical activity into your daily routine.  YOUR PLAN:  -SOMNOLENCE: Your persistent sleepiness is likely due to clonidine. We will gradually reduce your morning dose of clonidine and increase your carvedilol to 25 mg in the morning and 25 mg in the evening. Please monitor for any changes in your sleepiness after these adjustments.  -HYPERTENSION: Hypertension, or high blood pressure, requires careful management. We have adjusted your carvedilol dose to 25 mg twice daily to help control your blood pressure and reduce sleepiness. Please continue to monitor your blood pressure regularly and follow a low sodium diet.  -TYPE 2 DIABETES MELLITUS: Type 2 Diabetes Mellitus is a condition where your body has difficulty regulating blood sugar levels. While it was not the primary focus today, it is important to maintain good blood glucose control. We encourage you to engage in regular physical activity, such as daily walks, and continue with your current diabetes management regimen.  INSTRUCTIONS:  Please monitor your blood pressure regularly and keep track of any changes in your sleepiness after adjusting your medications. Continue following a low sodium diet and incorporate regular physical activity into your routine. If you experience any new symptoms or have concerns, please schedule a follow-up appointment.

## 2023-11-15 NOTE — Progress Notes (Signed)
 Subjective:  Patient ID: Anthony Barnes, male    DOB: Apr 20, 1956  Age: 68 y.o. MRN: [id number]  CC: Hypertension     Discussed the use of AI scribe software for clinical note transcription with the patient, who gave verbal consent to proceed.  History of Present Illness The patient, with a history of hypertension, type 2 diabetes mellitus A1c , morbid obesity  presents with concerns about his medication regimen. He reports feeling excessively sleepy, particularly after taking his morning medications. He initially attributed this to his carvedilol, but the sleepiness persisted even after reducing the morning dose. He also reports being out of clonidine for the past five days.  He is unable to tell if he has still been somnolent when he has been out of clonidine.  His blood pressure is still elevated today.  The patient has been adhering to the recent changes in his medication regimen, which included stopping lisinopril/hydrochlorothiazide and starting valsartan/hydrochlorothiazide at his last visit. He confirms taking these medications as instructed.  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 He expresses a desire to incorporate more physical activity into his daily routine, acknowledging the need to establish a regular exercise habit.    Past Medical History:  Diagnosis Date   Diabetes mellitus without complication (HCC)    Hypertension     Past Surgical History:  Procedure Laterality Date   BRAIN SURGERY     ESOPHAGOGASTRODUODENOSCOPY N/A 03/22/2013   Procedure: ESOPHAGOGASTRODUODENOSCOPY (EGD);  Surgeon: Graylin Shiver, MD;  Location: [facility] ENDOSCOPY;  Service: Endoscopy;  Laterality: N/A;    Family History  Problem Relation Age of Onset   Hypertension Mother    Cancer Mother 88       ovarian   Diabetes Father    Stroke Father     Social History   Socioeconomic History   Marital status:  Married    Spouse name: Not on file   Number of children: Not on file   Years of education: Not on file   Highest education level: Not on file  Occupational History   Not on file  Tobacco Use   Smoking status: Former    Current packs/day: 0.00    Average packs/day: 1 pack/day for 16.0 years (16.0 ttl pk-yrs)    Types: Cigarettes    Start date: 08/14/1974    Quit date: 08/14/1990    Years since quitting: 33.2   Smokeless tobacco: Former    Quit date: 08/14/1990  Vaping Use   Vaping status: Never Used  Substance and Sexual Activity   Alcohol use: Not Currently    Comment: weekends only   Drug use: No   Sexual activity: Not on file  Other Topics Concern   Not on file  Social History Narrative   Not on file   Social Drivers of Health   Financial Resource Strain: High Risk (08/21/2023)   Overall Financial Resource Strain (CARDIA)    Difficulty of Paying Living Expenses: Hard  Food Insecurity: No Food Insecurity (08/21/2023)   Hunger Vital Sign    Worried About Running Out of Food in the Last Year: Never true    Ran Out of Food in the Last Year: Never true  Transportation Needs: No Transportation Needs (08/21/2023)   PRAPARE - Administrator, Civil Service (Medical): No    Lack of Transportation (Non-Medical): No  Physical Activity: Insufficiently Active (08/21/2023)   Exercise Vital  Sign    Days of Exercise per Week: 3 days    Minutes of Exercise per Session: 20 min  Stress: No Stress Concern Present (08/21/2023)   [employer] of Occupational Health - Occupational Stress Questionnaire    Feeling of Stress : Not at all  Social Connections: Socially Integrated (08/21/2023)   Social Connection and Isolation Panel [NHANES]    Frequency of Communication with Friends and Family: More than three times a week    Frequency of Social Gatherings with Friends and Family: More than three times a week    Attends Religious Services: More than 4 times per year    Active Member of  [employer] or Organizations: Yes    Attends Engineer, structural: More than 4 times per year    Marital Status: Married    Allergies  Allergen Reactions   Shellfish Allergy Anaphylaxis   Codeine Itching    unknown    Outpatient Medications Prior to Visit  Medication Sig Dispense Refill   amLODipine (NORVASC) 10 MG tablet Take 1 tablet (10 mg total) by mouth daily. 90 tablet 1   atorvastatin (LIPITOR) 40 MG tablet Take 1 tablet (40 mg total) by mouth daily. 90 tablet 1   Blood Glucose Monitoring Suppl (TRUE METRIX METER) w/Device KIT Check blood sugar 3 times daily. 1 kit 0   carvedilol (COREG) 25 MG tablet Take 1 tablet (25 mg total) by mouth 2 (two) times daily with a meal. 180 tablet 1   cetirizine (ZYRTEC ALLERGY) 10 MG tablet Take 1 tablet (10 mg total) by mouth daily. (Patient not taking: Reported on 08/21/2023) 7 tablet 0   cloNIDine (CATAPRES) 0.3 MG tablet Take 1 tablet (0.3 mg total) by mouth 2 (two) times daily. 180 tablet 0   gabapentin (NEURONTIN) 300 MG capsule Take 1 capsule (300 mg total) by mouth at bedtime. (Patient not taking: Reported on 08/21/2023) 90 capsule 1   glipiZIDE (GLUCOTROL) 10 MG tablet TAKE ONE TABLET BY MOUTH TWICE DAILY BEFORE MEALS 180 tablet 1   ibuprofen (ADVIL,MOTRIN) 800 MG tablet Take 1 tablet (800 mg total) by mouth every 12 (twelve) hours as needed. 30 tablet 0   Multiple Vitamin (MULTIVITAMIN WITH MINERALS) TABS tablet Take 1 tablet by mouth daily.     Olopatadine HCl 0.2 % SOLN Apply 1 drop to eye daily. 2.5 mL 0   omega-3 acid ethyl esters (LOVAZA) 1 g capsule Take 1 g by mouth 2 (two) times daily.     pioglitazone (ACTOS) 15 MG tablet Take 1 tablet (15 mg total) by mouth daily. 90 tablet 1   TRUEplus Lancets 28G MISC CHECK BLOOD SUGAR 3 TIMES DAILY. 100 each 2   valsartan-hydrochlorothiazide (DIOVAN-HCT) 320-25 MG tablet Take 1 tablet by mouth daily. 90 tablet 1   bacitracin ointment Apply 1 application topically 2 (two) times daily. (Patient  not taking: Reported on 08/21/2023) 120 g 0   erythromycin ophthalmic ointment Place a 1/2 inch ribbon of ointment into the lower eyelid 4 times daily for 7 days. (Patient not taking: Reported on 08/21/2023) 3.5 g 0   No facility-administered medications prior to visit.     ROS Review of Systems  Constitutional:  Negative for activity change and appetite change.  HENT:  Negative for sinus pressure and sore throat.   Respiratory:  Negative for chest tightness, shortness of breath and wheezing.   Cardiovascular:  Negative for chest pain and palpitations.  Gastrointestinal:  Negative for abdominal distention, abdominal pain and constipation.  Genitourinary: Negative.   Musculoskeletal: Negative.   Psychiatric/Behavioral:  Negative for behavioral problems and dysphoric mood.     Objective:  BP (!) 153/94   Pulse 69   Ht 6\' 4"  (1.93 m)   Wt (!) 307 lb 6.4 oz (139.4 kg)   SpO2 98%   BMI 37.42 kg/m      11/15/2023   10:23 AM 11/15/2023    9:46 AM 08/21/2023    4:27 PM  BP/Weight  Systolic BP 153 166 183  Diastolic BP 94 83 95  Wt. (Lbs)  307.4   BMI  37.42 kg/m2       Physical Exam Constitutional:      Appearance: He is well-developed.  Cardiovascular:     Rate and Rhythm: Normal rate.     Heart sounds: Normal heart sounds. No murmur heard. Pulmonary:     Effort: Pulmonary effort is normal.     Breath sounds: Normal breath sounds. No wheezing or rales.  Chest:     Chest wall: No tenderness.  Abdominal:     General: Bowel sounds are normal. There is no distension.     Palpations: Abdomen is soft. There is no mass.     Tenderness: There is no abdominal tenderness.  Musculoskeletal:        General: Normal range of motion.     Right lower leg: No edema.     Left lower leg: No edema.  Neurological:     Mental Status: He is alert and oriented to person, place, and time.  Psychiatric:        Mood and Affect: Mood normal.        Latest Ref Rng & Units 08/21/2023    4:41 PM  10/17/2022    4:43 PM 12/20/2021    2:13 PM  CMP  Glucose 70 - 99 mg/dL 409  811  914   BUN 8 - 27 mg/dL 20  22  15    Creatinine 0.76 - 1.27 mg/dL 7.82  9.56  2.13   Sodium 134 - 144 mmol/L 137  138  134   Potassium 3.5 - 5.2 mmol/L 4.3  4.1  3.5   Chloride 96 - 106 mmol/L 99  100  101   CO2 20 - 29 mmol/L 24  22  25    Calcium 8.6 - 10.2 mg/dL 08.6  9.9  9.2   Total Protein 6.0 - 8.5 g/dL  7.9    Total Bilirubin 0.0 - 1.2 mg/dL  0.3    Alkaline Phos 44 - 121 IU/L  51    AST 0 - 40 IU/L  20    ALT 0 - 44 IU/L  26      Lipid Panel     Component Value Date/Time   CHOL 156 07/20/2020 0903   TRIG 48 07/20/2020 0903   HDL 49 07/20/2020 0903   CHOLHDL 3.2 07/20/2020 0903   CHOLHDL 3.2 07/21/2016 1051   VLDL 10 07/21/2016 1051   LDLCALC 97 07/20/2020 0903    CBC    Component Value Date/Time   WBC 8.1 11/25/2017 0407   RBC 4.82 11/25/2017 0407   HGB 13.5 11/25/2017 0407   HCT 39.1 11/25/2017 0407   PLT 211 11/25/2017 0407   MCV 81.1 11/25/2017 0407   MCH 28.0 11/25/2017 0407   MCHC 34.5 11/25/2017 0407   RDW 15.4 11/25/2017 0407   LYMPHSABS 1.8 10/20/2013 1250   MONOABS 0.7 10/20/2013 1250   EOSABS 0.1 10/20/2013 1250   BASOSABS 0.0  10/20/2013 1250    Lab Results  Component Value Date   HGBA1C 8.9 (A) 08/21/2023       Assessment & Plan Somnolence Persistent somnolence likely due to clonidine. -Was of the opinion that somnolence was caused by taking a whole pill of 25 mg of carvedilol and that half of that (12.5 mg) would bring about resolution but somnolence I have advised him that somnolence is likely from his clonidine - Wean off morning dose of clonidine, continue 0.3 mg in the evenings - Increase carvedilol from 12.5 mg to 25 mg in the morning and 25 mg in the evening. - Monitor for changes in somnolence after medication adjustment.  Hypertension Hypertension management complicated by medication adjustments.  -He has been out of clonidine which could also  explain this  -Revert back to carvedilol 25 mg twice daily. - Monitor blood pressure regularly. - Continue low sodium diet.        No orders of the defined types were placed in this encounter.   Follow-up: Return in about 2 months (around 01/15/2024) for Chronic medical conditions.       Hoy Register, MD, FAAFP. [facility] [place], [place] [phone]   11/15/2023, 2:13 PM

## 2023-11-21 DIAGNOSIS — E119 Type 2 diabetes mellitus without complications: Secondary | ICD-10-CM | POA: Diagnosis not present

## 2023-11-21 DIAGNOSIS — M5431 Sciatica, right side: Secondary | ICD-10-CM | POA: Diagnosis not present

## 2023-11-21 DIAGNOSIS — M545 Low back pain, unspecified: Secondary | ICD-10-CM | POA: Diagnosis not present

## 2023-12-05 ENCOUNTER — Other Ambulatory Visit (HOSPITAL_BASED_OUTPATIENT_CLINIC_OR_DEPARTMENT_OTHER): Payer: Self-pay | Admitting: Pharmacist

## 2023-12-05 ENCOUNTER — Encounter: Payer: Self-pay | Admitting: Pharmacist

## 2023-12-05 DIAGNOSIS — Z7984 Long term (current) use of oral hypoglycemic drugs: Secondary | ICD-10-CM

## 2023-12-05 DIAGNOSIS — E1159 Type 2 diabetes mellitus with other circulatory complications: Secondary | ICD-10-CM

## 2023-12-05 DIAGNOSIS — E785 Hyperlipidemia, unspecified: Secondary | ICD-10-CM

## 2023-12-05 DIAGNOSIS — E1169 Type 2 diabetes mellitus with other specified complication: Secondary | ICD-10-CM

## 2023-12-05 DIAGNOSIS — E119 Type 2 diabetes mellitus without complications: Secondary | ICD-10-CM

## 2023-12-05 DIAGNOSIS — E1149 Type 2 diabetes mellitus with other diabetic neurological complication: Secondary | ICD-10-CM

## 2023-12-05 MED ORDER — ATORVASTATIN CALCIUM 40 MG PO TABS
40.0000 mg | ORAL_TABLET | Freq: Every day | ORAL | 1 refills | Status: DC
Start: 2023-12-05 — End: 2024-06-03

## 2023-12-05 MED ORDER — GLIPIZIDE 10 MG PO TABS
ORAL_TABLET | ORAL | 1 refills | Status: DC
Start: 2023-12-05 — End: 2024-06-03

## 2023-12-05 MED ORDER — VALSARTAN-HYDROCHLOROTHIAZIDE 320-25 MG PO TABS
1.0000 | ORAL_TABLET | Freq: Every day | ORAL | 1 refills | Status: DC
Start: 2023-12-05 — End: 2024-06-03

## 2023-12-05 NOTE — Progress Notes (Signed)
 Pharmacy TNM Diabetes Measure Review  S:  Patient was identified in a report as being at risk for failing the True [place] Metric of A1c control (<8%) in [place] and African American patients. Last A1c was 8.9 in Jan of this year. He is due for an updated A1c. Last PCP visit was 11/15/2023 but this was for a reason other than DM control.  Call placed to patient to discuss diabetes control and medication management. Patient has been seen by the clinical pharmacist before. Unfortunately, I was unable to reach him today. Left HIPAA-compliant VM with instructions to return my call.   Current diabetes medications include: glipizide  10 mg BID, pioglitazone  15 mg daily Insurance coverage: [employer]  Of note, he never picked up the pioglitazone . I called his pharmacy and requested they fill this for him today.   O:   Lab Results  Component Value Date   HGBA1C 8.9 (A) 08/21/2023   There were no vitals filed for this visit.  Lipid Panel     Component Value Date/Time   CHOL 156 07/20/2020 0903   TRIG 48 07/20/2020 0903   HDL 49 07/20/2020 0903   CHOLHDL 3.2 07/20/2020 0903   CHOLHDL 3.2 07/21/2016 1051   VLDL 10 07/21/2016 1051   LDLCALC 97 07/20/2020 0903    Clinical Atherosclerotic Cardiovascular Disease (ASCVD): No  The ASCVD Risk score (Arnett DK, et al., 2019) failed to calculate for the following reasons:   Cannot find a previous HDL lab   Cannot find a previous total cholesterol lab   Patient is participating in a Managed Medicaid Plan: No   A/P: Diabetes longstanding currently uncontrolled. I was unable to speak to the patient, but I called his pharmacy and confirmed he is not taking the pioglitazone . When I tired to authorize the pharmacy to fill the pioglitazone , they tell me it was initially billed incorrectly. I had them run this under his [employer] plan and it was covered for 90-day at $0. They will fill this today and notify the patient when the medication  is ready for pick-up.  -Next A1c is due this month. If I do not see him before then, I anticipate an updated A1c at PCP follow-up in June.   Follow-up:  Pharmacist prn. Left VM encouraging pt to return call and set up appt.  PCP clinic visit in 01/15/2024.  Marene Shape, PharmD, Becky Bowels, CPP Clinical Pharmacist [facility] [phone]

## 2023-12-05 NOTE — Progress Notes (Signed)
 Pharmacy Quality Measure Review  This patient is appearing on a report for being at risk of failing the adherence measure for cholesterol (statin), diabetes, and hypertension (ACEi/ARB) medications this calendar year.   Medication: glipizide  Last fill date: 09/13/2023 for 90 day supply  Medication: pioglitazone  Never dispensed  Medication: atorvastatin  Never dispensed   Medication: valsartan -HCTZ Last fill date: 09/13/2023 for 90 day supply  Contacted pharmacy to facilitate refills. They confirmed his non-adherence but are getting these ready for him today.   Marene Shape, PharmD, Becky Bowels, CPP Clinical Pharmacist [facility] [phone]

## 2023-12-12 ENCOUNTER — Telehealth: Payer: Self-pay | Admitting: Pharmacist

## 2023-12-12 NOTE — Telephone Encounter (Signed)
 I have been working with [facility] to assess this patient's adherence to his medications and his DM control. Unfortunately, his metrics count against us  year after year. He failed adherence to his PO anti-DM medications, statin, and his ARB combination tablet last year and is already in danger of failing this year.   Unsurprisingly, his A1c is above goal and his BP in clinic is consistently above goal. I have tried throughout this month to get in touch with him. Unfortunately, his VM is full and I am unable to leave a VM.   Because of this, I went straight to the source and called [facility] last week. Thankfully, they filled 90-day supplies of atorvastatin , glipizide , pioglitazone , and the combo ARB/thiazide on 12/05/2023. I will set reminds to contact them again in three months in an attempt to help this pt sustain good adherence. Prior to filling on 12/05/23, the atorvastatin  and pioglitazone  were NEVER filled! Hopefully, his numbers will begin to respond given then I filled these for him and he picked them up last week.   Just wanted to keep you in the loop.   Marene Shape, PharmD, Becky Bowels, CPP Clinical Pharmacist [facility] & [facility] [phone]

## 2023-12-12 NOTE — Telephone Encounter (Signed)
 Noted.

## 2023-12-18 ENCOUNTER — Other Ambulatory Visit: Payer: Self-pay | Admitting: Family Medicine

## 2023-12-18 DIAGNOSIS — E1159 Type 2 diabetes mellitus with other circulatory complications: Secondary | ICD-10-CM

## 2024-01-15 ENCOUNTER — Ambulatory Visit: Admitting: Family Medicine

## 2024-01-15 DIAGNOSIS — E1149 Type 2 diabetes mellitus with other diabetic neurological complication: Secondary | ICD-10-CM

## 2024-02-19 ENCOUNTER — Other Ambulatory Visit: Payer: Self-pay | Admitting: Family Medicine

## 2024-02-19 DIAGNOSIS — E1159 Type 2 diabetes mellitus with other circulatory complications: Secondary | ICD-10-CM

## 2024-02-29 ENCOUNTER — Telehealth: Payer: Self-pay | Admitting: Family Medicine

## 2024-02-29 NOTE — Telephone Encounter (Signed)
 Contacted pt left vm to confirmed appt

## 2024-03-03 ENCOUNTER — Ambulatory Visit: Attending: Family Medicine | Admitting: Family Medicine

## 2024-03-03 ENCOUNTER — Encounter: Payer: Self-pay | Admitting: Family Medicine

## 2024-03-03 VITALS — BP 163/92 | HR 71 | Ht 76.0 in | Wt 309.8 lb

## 2024-03-03 DIAGNOSIS — Z7984 Long term (current) use of oral hypoglycemic drugs: Secondary | ICD-10-CM | POA: Diagnosis not present

## 2024-03-03 DIAGNOSIS — E1159 Type 2 diabetes mellitus with other circulatory complications: Secondary | ICD-10-CM | POA: Diagnosis not present

## 2024-03-03 DIAGNOSIS — Z91148 Patient's other noncompliance with medication regimen for other reason: Secondary | ICD-10-CM

## 2024-03-03 DIAGNOSIS — I152 Hypertension secondary to endocrine disorders: Secondary | ICD-10-CM | POA: Diagnosis not present

## 2024-03-03 DIAGNOSIS — E1149 Type 2 diabetes mellitus with other diabetic neurological complication: Secondary | ICD-10-CM | POA: Diagnosis not present

## 2024-03-03 LAB — POCT GLYCOSYLATED HEMOGLOBIN (HGB A1C): HbA1c, POC (controlled diabetic range): 8.7 % — AB (ref 0.0–7.0)

## 2024-03-03 MED ORDER — PIOGLITAZONE HCL 15 MG PO TABS: 15.0000 mg | ORAL_TABLET | Freq: Every day | ORAL | 1 refills | Status: DC

## 2024-03-03 NOTE — Progress Notes (Signed)
 Subjective:  Patient ID: Anthony Barnes, male    DOB: 1955-09-27  Age: 68 y.o. MRN: [id number]  CC: Medical Management of Chronic Issues     Discussed the use of AI scribe software for clinical note transcription with the patient, who gave verbal consent to proceed.  History of Present Illness [name] is a 68 year old male with a history of hypertension, type 2 diabetes mellitus A1c , morbid obesity who presents with concerns about medication management and elevated blood pressure.  He experiences elevated blood pressure in the clinic attributing this to 'white coat syndrome' however he does not check his blood pressures at home.  He takes clonidine  only at night due to drowsiness. His current antihypertensive medications include amlodipine  10 mg, carvedilol  25 mg twice daily, and valsartan /hydrochlorothiazide  320 mg / 25 mg, which he recently resumed 1 to 2 days ago but prior to this had not been taking it. He is concerned about potential kidney damage from these medications, referencing his brother's dialysis treatment. He is wondering about alternatives for blood pressure management besides medications.  He manages diabetes with glipizide  10 mg twice daily but has not been taking pioglitazone  15 mg daily as prescribed.  The clinical pharmacist had called his pharmacy and verified he had not picked up this medication and the patient was informed to pick up his Actos  but somehow he never did.  He expresses concern about medication side effects and their impact on his kidneys.  He takes atorvastatin  40 mg for cholesterol, which he recently restarted after missing doses.     Past Medical History:  Diagnosis Date   Diabetes mellitus without complication (HCC)    Hypertension     Past Surgical History:  Procedure Laterality Date   BRAIN SURGERY     ESOPHAGOGASTRODUODENOSCOPY N/A 03/22/2013   Procedure: ESOPHAGOGASTRODUODENOSCOPY (EGD);  Surgeon: Lesta JULIANNA Fitz, MD;  Location:  [facility] ENDOSCOPY;  Service: Endoscopy;  Laterality: N/A;    Family History  Problem Relation Age of Onset   Hypertension Mother    Cancer Mother 73       ovarian   Diabetes Father    Stroke Father     Social History   Socioeconomic History   Marital status: Married    Spouse name: Not on file   Number of children: Not on file   Years of education: Not on file   Highest education level: Not on file  Occupational History   Not on file  Tobacco Use   Smoking status: Former    Current packs/day: 0.00    Average packs/day: 1 pack/day for 16.0 years (16.0 ttl pk-yrs)    Types: Cigarettes    Start date: 08/14/1974    Quit date: 08/14/1990    Years since quitting: 33.5   Smokeless tobacco: Former    Quit date: 08/14/1990  Vaping Use   Vaping status: Never Used  Substance and Sexual Activity   Alcohol use: Not Currently    Comment: weekends only   Drug use: No   Sexual activity: Not on file  Other Topics Concern   Not on file  Social History Narrative   Not on file   Social Drivers of Health   Financial Resource Strain: High Risk (08/21/2023)   Overall Financial Resource Strain (CARDIA)    Difficulty of Paying Living Expenses: Hard  Food Insecurity: No Food Insecurity (08/21/2023)   Hunger Vital Sign    Worried About Running Out of Food in the Last Year:  Never true    Ran Out of Food in the Last Year: Never true  Transportation Needs: No Transportation Needs (08/21/2023)   PRAPARE - Administrator, Civil Service (Medical): No    Lack of Transportation (Non-Medical): No  Physical Activity: Insufficiently Active (08/21/2023)   Exercise Vital Sign    Days of Exercise per Week: 3 days    Minutes of Exercise per Session: 20 min  Stress: No Stress Concern Present (08/21/2023)   [employer] of Occupational Health - Occupational Stress Questionnaire    Feeling of Stress : Not at all  Social Connections: Socially Integrated (08/21/2023)   Social Connection and Isolation  Panel    Frequency of Communication with Friends and Family: More than three times a week    Frequency of Social Gatherings with Friends and Family: More than three times a week    Attends Religious Services: More than 4 times per year    Active Member of [employer] or Organizations: Yes    Attends Engineer, structural: More than 4 times per year    Marital Status: Married    Allergies  Allergen Reactions   Shellfish Allergy Anaphylaxis   Codeine  Itching    unknown    Outpatient Medications Prior to Visit  Medication Sig Dispense Refill   amLODipine  (NORVASC ) 10 MG tablet TAKE ONE TABLET BY MOUTH ONCE A DAY 90 tablet 0   atorvastatin  (LIPITOR) 40 MG tablet Take 1 tablet (40 mg total) by mouth daily. 90 tablet 1   Blood Glucose Monitoring Suppl (TRUE METRIX METER) w/Device KIT Check blood sugar 3 times daily. 1 kit 0   carvedilol  (COREG ) 25 MG tablet Take 1 tablet (25 mg total) by mouth 2 (two) times daily with a meal. 180 tablet 1   cetirizine  (ZYRTEC  ALLERGY) 10 MG tablet Take 1 tablet (10 mg total) by mouth daily. 7 tablet 0   cloNIDine  (CATAPRES ) 0.3 MG tablet TAKE ONE TABLET BY MOUTH TWICE DAILY 180 tablet 0   glipiZIDE  (GLUCOTROL ) 10 MG tablet TAKE ONE TABLET BY MOUTH TWICE DAILY BEFORE MEALS 180 tablet 1   ibuprofen  (ADVIL ,MOTRIN ) 800 MG tablet Take 1 tablet (800 mg total) by mouth every 12 (twelve) hours as needed. 30 tablet 0   Multiple Vitamin (MULTIVITAMIN WITH MINERALS) TABS tablet Take 1 tablet by mouth daily.     Olopatadine  HCl 0.2 % SOLN Apply 1 drop to eye daily. 2.5 mL 0   omega-3 acid ethyl esters (LOVAZA) 1 g capsule Take 1 g by mouth 2 (two) times daily.     TRUEplus Lancets 28G MISC CHECK BLOOD SUGAR 3 TIMES DAILY. 100 each 2   valsartan -hydrochlorothiazide  (DIOVAN -HCT) 320-25 MG tablet Take 1 tablet by mouth daily. 90 tablet 1   gabapentin  (NEURONTIN ) 300 MG capsule Take 1 capsule (300 mg total) by mouth at bedtime. (Patient not taking: Reported on 03/03/2024)  90 capsule 1   pioglitazone  (ACTOS ) 15 MG tablet Take 1 tablet (15 mg total) by mouth daily. (Patient not taking: Reported on 03/03/2024) 90 tablet 1   No facility-administered medications prior to visit.     ROS Review of Systems  Constitutional:  Negative for activity change and appetite change.  HENT:  Negative for sinus pressure and sore throat.   Respiratory:  Negative for chest tightness, shortness of breath and wheezing.   Cardiovascular:  Negative for chest pain and palpitations.  Gastrointestinal:  Negative for abdominal distention, abdominal pain and constipation.  Genitourinary: Negative.   Musculoskeletal: Negative.  Psychiatric/Behavioral:  Negative for behavioral problems and dysphoric mood.     Objective:  BP (!) 163/92   Pulse 71   Ht 6' 4 (1.93 m)   Wt (!) 309 lb 12.8 oz (140.5 kg)   SpO2 98%   BMI 37.71 kg/m      03/03/2024    4:28 PM 03/03/2024    3:49 PM 11/15/2023   10:23 AM  BP/Weight  Systolic BP 163 175 153  Diastolic BP 92 102 94  Wt. (Lbs)  309.8   BMI  37.71 kg/m2       Physical Exam Constitutional:      Appearance: He is well-developed. He is obese.  Cardiovascular:     Rate and Rhythm: Normal rate.     Heart sounds: Normal heart sounds. No murmur heard. Pulmonary:     Effort: Pulmonary effort is normal.     Breath sounds: Normal breath sounds. No wheezing or rales.  Chest:     Chest wall: No tenderness.  Abdominal:     General: Bowel sounds are normal. There is no distension.     Palpations: Abdomen is soft. There is no mass.     Tenderness: There is no abdominal tenderness.  Musculoskeletal:        General: Normal range of motion.     Right lower leg: No edema.     Left lower leg: No edema.  Neurological:     Mental Status: He is alert and oriented to person, place, and time.  Psychiatric:        Mood and Affect: Mood normal.        Latest Ref Rng & Units 08/21/2023    4:41 PM 10/17/2022    4:43 PM 12/20/2021    2:13 PM   CMP  Glucose 70 - 99 mg/dL 818  843  836   BUN 8 - 27 mg/dL 20  22  15    Creatinine 0.76 - 1.27 mg/dL 8.94  9.01  8.98   Sodium 134 - 144 mmol/L 137  138  134   Potassium 3.5 - 5.2 mmol/L 4.3  4.1  3.5   Chloride 96 - 106 mmol/L 99  100  101   CO2 20 - 29 mmol/L 24  22  25    Calcium  8.6 - 10.2 mg/dL 89.9  9.9  9.2   Total Protein 6.0 - 8.5 g/dL  7.9    Total Bilirubin 0.0 - 1.2 mg/dL  0.3    Alkaline Phos 44 - 121 IU/L  51    AST 0 - 40 IU/L  20    ALT 0 - 44 IU/L  26      Lipid Panel     Component Value Date/Time   CHOL 156 07/20/2020 0903   TRIG 48 07/20/2020 0903   HDL 49 07/20/2020 0903   CHOLHDL 3.2 07/20/2020 0903   CHOLHDL 3.2 07/21/2016 1051   VLDL 10 07/21/2016 1051   LDLCALC 97 07/20/2020 0903    CBC    Component Value Date/Time   WBC 8.1 11/25/2017 0407   RBC 4.82 11/25/2017 0407   HGB 13.5 11/25/2017 0407   HCT 39.1 11/25/2017 0407   PLT 211 11/25/2017 0407   MCV 81.1 11/25/2017 0407   MCH 28.0 11/25/2017 0407   MCHC 34.5 11/25/2017 0407   RDW 15.4 11/25/2017 0407   LYMPHSABS 1.8 10/20/2013 1250   MONOABS 0.7 10/20/2013 1250   EOSABS 0.1 10/20/2013 1250   BASOSABS 0.0 10/20/2013 1250  Lab Results  Component Value Date   HGBA1C 8.7 (A) 03/03/2024    Lab Results  Component Value Date   HGBA1C 8.7 (A) 03/03/2024   HGBA1C 8.9 (A) 08/21/2023   HGBA1C 8.5 (A) 05/16/2023       Assessment & Plan Hypertension associated with type 2 diabetes mellitus Blood pressure elevated due to non-adherence. Concern about medication-induced kidney damage affects compliance. Emphasized greater risk of kidney damage from uncontrolled hypertension. - Bring all medication bottles to next visit for reconciliation. -I have ordered a renin aldosterone level on him - Recheck blood pressure at next visit. - Consider spironolactone  if blood pressure remains elevated.  Type II Diabetes Mellitus Uncontrolled with A1c of 8.7 Failure to achieve A1c goal of less than 7  likely due to medication nonadherence Diabetes managed with glipizide . Non-adherence to pioglitazone . Emphasized importance of diabetes control to prevent kidney damage. - Send pioglitazone  prescription to pharmacy. - Instruct him to start pioglitazone  as prescribed. -Counseled on Diabetic diet, the healthy plate, 849 minutes of moderate intensity exercise/week Blood sugar logs with fasting goals of 80-120 mg/dl, random of less than 819 and in the event of sugars less than 60 mg/dl or greater than 599 mg/dl encouraged to notify the clinic. Advised on the need for annual eye exams, annual foot exams, Pneumonia vaccine.   Medication nonadherence Concern due to family history of kidney failure. Current kidney function normal. Emphasized managing hypertension and diabetes to prevent damage and advised that he is at risk of chronic kidney disease if he continues at this rate and we have had this conversation on several occasions but he consistently does what he wants. - Check kidney function with labs.  Long-term use of oral diabetic medication See #1 above  Meds ordered this encounter  Medications   pioglitazone  (ACTOS ) 15 MG tablet    Sig: Take 1 tablet (15 mg total) by mouth daily.    Dispense:  90 tablet    Refill:  1    Follow-up: Return in about 2 weeks (around 03/17/2024) for Medication reconciliation with Herlene, Medical conditions with PCP, in 3 months.       Corrina Sabin, MD, FAAFP. [facility] [place], [place] [phone]   03/03/2024, 5:45 PM

## 2024-03-03 NOTE — Patient Instructions (Signed)
 Managing Your Hypertension Hypertension, also called high blood pressure, is when the force of the blood pressing against the walls of the arteries is too strong. Arteries are blood vessels that carry blood from your heart throughout your body. Hypertension forces the heart to work harder to pump blood and may cause the arteries to become narrow or stiff. Understanding blood pressure readings A blood pressure reading includes a higher number over a lower number: The first, or top, number is called the systolic pressure. It is a measure of the pressure in your arteries as your heart beats. The second, or bottom number, is called the diastolic pressure. It is a measure of the pressure in your arteries as the heart relaxes. For most people, a normal blood pressure is below 120/80. Your personal target blood pressure may vary depending on your medical conditions, your age, and other factors. Blood pressure is classified into four stages. Based on your blood pressure reading, your health care provider may use the following stages to determine what type of treatment you need, if any. Systolic pressure and diastolic pressure are measured in a unit called millimeters of mercury (mmHg). Normal Systolic pressure: below 120. Diastolic pressure: below 80. Elevated Systolic pressure: 120-129. Diastolic pressure: below 80. Hypertension stage 1 Systolic pressure: 130-139. Diastolic pressure: 80-89. Hypertension stage 2 Systolic pressure: 140 or above. Diastolic pressure: 90 or above. How can this condition affect me? Managing your hypertension is very important. Over time, hypertension can damage the arteries and decrease blood flow to parts of the body, including the brain, heart, and kidneys. Having untreated or uncontrolled hypertension can lead to: A heart attack. A stroke. A weakened blood vessel (aneurysm). Heart failure. Kidney damage. Eye damage. Memory and concentration problems. Vascular  dementia. What actions can I take to manage this condition? Hypertension can be managed by making lifestyle changes and possibly by taking medicines. Your health care provider will help you make a plan to bring your blood pressure within a normal range. You may be referred for counseling on a healthy diet and physical activity. Nutrition  Eat a diet that is high in fiber and potassium, and low in salt (sodium), added sugar, and fat. An example eating plan is called the DASH diet. DASH stands for Dietary Approaches to Stop Hypertension. To eat this way: Eat plenty of fresh fruits and vegetables. Try to fill one-half of your plate at each meal with fruits and vegetables. Eat whole grains, such as whole-wheat pasta, brown rice, or whole-grain bread. Fill about one-fourth of your plate with whole grains. Eat low-fat dairy products. Avoid fatty cuts of meat, processed or cured meats, and poultry with skin. Fill about one-fourth of your plate with lean proteins such as fish, chicken without skin, beans, eggs, and tofu. Avoid pre-made and processed foods. These tend to be higher in sodium, added sugar, and fat. Reduce your daily sodium intake. Many people with hypertension should eat less than 1,500 mg of sodium a day. Lifestyle  Work with your health care provider to maintain a healthy body weight or to lose weight. Ask what an ideal weight is for you. Get at least 30 minutes of exercise that causes your heart to beat faster (aerobic exercise) most days of the week. Activities may include walking, swimming, or biking. Include exercise to strengthen your muscles (resistance exercise), such as weight lifting, as part of your weekly exercise routine. Try to do these types of exercises for 30 minutes at least 3 days a week. Do  not use any products that contain nicotine or tobacco. These products include cigarettes, chewing tobacco, and vaping devices, such as e-cigarettes. If you need help quitting, ask your  health care provider. Control any long-term (chronic) conditions you have, such as high cholesterol or diabetes. Identify your sources of stress and find ways to manage stress. This may include meditation, deep breathing, or making time for fun activities. Alcohol use Do not drink alcohol if: Your health care provider tells you not to drink. You are pregnant, may be pregnant, or are planning to become pregnant. If you drink alcohol: Limit how much you have to: 0-1 drink a day for women. 0-2 drinks a day for men. Know how much alcohol is in your drink. In the [place] one drink equals one 12 oz bottle of beer (355 mL), one 5 oz glass of wine (148 mL), or one 1 oz glass of hard liquor (44 mL). Medicines Your health care provider may prescribe medicine if lifestyle changes are not enough to get your blood pressure under control and if: Your systolic blood pressure is 130 or higher. Your diastolic blood pressure is 80 or higher. Take medicines only as told by your health care provider. Follow the directions carefully. Blood pressure medicines must be taken as told by your health care provider. The medicine does not work as well when you skip doses. Skipping doses also puts you at risk for problems. Monitoring Before you monitor your blood pressure: Do not smoke, drink caffeinated beverages, or exercise within 30 minutes before taking a measurement. Use the bathroom and empty your bladder (urinate). Sit quietly for at least 5 minutes before taking measurements. Monitor your blood pressure at home as told by your health care provider. To do this: Sit with your back straight and supported. Place your feet flat on the floor. Do not cross your legs. Support your arm on a flat surface, such as a table. Make sure your upper arm is at heart level. Each time you measure, take two or three readings one minute apart and record the results. You may also need to have your blood pressure checked regularly by  your health care provider. General information Talk with your health care provider about your diet, exercise habits, and other lifestyle factors that may be contributing to hypertension. Review all the medicines you take with your health care provider because there may be side effects or interactions. Keep all follow-up visits. Your health care provider can help you create and adjust your plan for managing your high blood pressure. Where to find more information National Heart, Lung, and Blood Institute: [employer]Ste[employer].is American Heart Association: www.heart.org Contact a health care provider if: You think you are having a reaction to medicines you have taken. You have repeated (recurrent) headaches. You feel dizzy. You have swelling in your ankles. You have trouble with your vision. Get help right away if: You develop a severe headache or confusion. You have unusual weakness or numbness, or you feel faint. You have severe pain in your chest or abdomen. You vomit repeatedly. You have trouble breathing. These symptoms may be an emergency. Get help right away. Call 911. Do not wait to see if the symptoms will go away. Do not drive yourself to the hospital. Summary Hypertension is when the force of blood pumping through your arteries is too strong. If this condition is not controlled, it may put you at risk for serious complications. Your personal target blood pressure may vary depending on your medical conditions,  your age, and other factors. For most people, a normal blood pressure is less than 120/80. Hypertension is managed by lifestyle changes, medicines, or both. Lifestyle changes to help manage hypertension include losing weight, eating a healthy, low-sodium diet, exercising more, stopping smoking, and limiting alcohol. This information is not intended to replace advice given to you by your health care provider. Make sure you discuss any questions you have with your health care  provider. Document Revised: 04/14/2021 Document Reviewed: 04/14/2021 Elsevier Patient Education  2024 [employer].

## 2024-03-06 ENCOUNTER — Ambulatory Visit: Payer: Self-pay | Admitting: Family Medicine

## 2024-03-08 LAB — CMP14+EGFR
ALT: 20 IU/L (ref 0–44)
AST: 16 IU/L (ref 0–40)
Albumin: 4.3 g/dL (ref 3.9–4.9)
Alkaline Phosphatase: 55 IU/L (ref 44–121)
BUN/Creatinine Ratio: 15 (ref 10–24)
BUN: 16 mg/dL (ref 8–27)
Bilirubin Total: 0.3 mg/dL (ref 0.0–1.2)
CO2: 25 mmol/L (ref 20–29)
Calcium: 10 mg/dL (ref 8.6–10.2)
Chloride: 103 mmol/L (ref 96–106)
Creatinine, Ser: 1.04 mg/dL (ref 0.76–1.27)
Globulin, Total: 3.3 g/dL (ref 1.5–4.5)
Glucose: 200 mg/dL — ABNORMAL HIGH (ref 70–99)
Potassium: 4.4 mmol/L (ref 3.5–5.2)
Sodium: 142 mmol/L (ref 134–144)
Total Protein: 7.6 g/dL (ref 6.0–8.5)
eGFR: 79 mL/min/1.73 (ref >59)

## 2024-03-08 LAB — ALDOSTERONE + RENIN ACTIVITY W/ RATIO
Aldos/Renin Ratio: 2.6 (ref 0.0–30.0)
Aldosterone: 4.1 ng/dL (ref 0.0–30.0)
Renin Activity, Plasma: 1.583 ng/mL/h (ref 0.167–5.380)

## 2024-03-12 ENCOUNTER — Other Ambulatory Visit: Payer: Self-pay | Admitting: Family Medicine

## 2024-03-12 DIAGNOSIS — E1159 Type 2 diabetes mellitus with other circulatory complications: Secondary | ICD-10-CM

## 2024-03-18 ENCOUNTER — Telehealth: Payer: Self-pay | Admitting: Family Medicine

## 2024-03-18 NOTE — Telephone Encounter (Signed)
 Confirmed appt for 8/7

## 2024-03-20 ENCOUNTER — Ambulatory Visit: Attending: Family Medicine | Admitting: Pharmacist

## 2024-03-20 ENCOUNTER — Telehealth: Payer: Self-pay | Admitting: Pharmacist

## 2024-03-20 ENCOUNTER — Encounter: Payer: Self-pay | Admitting: Pharmacist

## 2024-03-20 DIAGNOSIS — E1149 Type 2 diabetes mellitus with other diabetic neurological complication: Secondary | ICD-10-CM

## 2024-03-20 DIAGNOSIS — Z7984 Long term (current) use of oral hypoglycemic drugs: Secondary | ICD-10-CM

## 2024-03-20 LAB — POCT GLYCOSYLATED HEMOGLOBIN (HGB A1C): HbA1c, POC (controlled diabetic range): 8.7 % — AB (ref 0.0–7.0)

## 2024-03-20 NOTE — Progress Notes (Signed)
    S:     No chief complaint on file.  68 y.o. male who presents for diabetes evaluation, education, and management in the context of the LIBERATE study. Patient arrives in good spirits and presents without any assistance.   Patient was referred and last seen by Primary Care Provider, Dr. Delbert, on 03/03/2024. At that visit, A1c was 8.7. He was referred to me for medication reconciliation to ensure he was taking everything correctly.    PMH is significant for HTN, T2DM, GERD. Patient reports Diabetes is longstanding.   Family/Social History:  -Fhx: HTN, DM, stroke -Tobacco: former smoker, quit in 1992 -Alcohol: none reported   Current diabetes medications include: glipizide  10 mg BID, pioglitazone  15 mg daily Patient reports adherence to taking all medications as prescribed. Has both of his DM medications with him today.   Insurance coverage: [employer]   Patient denies hypoglycemic events.  Reported home fasting blood sugars: none given  Reported 2 hour post-meal/random blood sugars: none given.  Patient denies nocturia (nighttime urination).  Patient denies neuropathy (nerve pain). Patient denies visual changes. Patient reports self foot exams.   Patient reported dietary habits:  -Admits to struggling with dietary indiscretion  Patient-reported exercise habits: none reported   O:  No GM or CGM present at this time.   Lab Results  Component Value Date   HGBA1C 8.7 (A) 03/20/2024   There were no vitals filed for this visit.  Lipid Panel     Component Value Date/Time   CHOL 156 07/20/2020 0903   TRIG 48 07/20/2020 0903   HDL 49 07/20/2020 0903   CHOLHDL 3.2 07/20/2020 0903   CHOLHDL 3.2 07/21/2016 1051   VLDL 10 07/21/2016 1051   LDLCALC 97 07/20/2020 0903    Clinical Atherosclerotic Cardiovascular Disease (ASCVD): No  The ASCVD Risk score (Arnett DK, et al., 2019) failed to calculate for the following reasons:   Cannot find a previous HDL lab    Cannot find a previous total cholesterol lab   Patient is participating in a Managed Medicaid Plan: No   A/P: LIBERATE Study:  -Patient provided verbal consent to participate in the study. Consent documented in electronic medical record.  -Provided education on Libre 3 CGM. Collaborated to ensure Herlene 3 app was downloaded on patient's phone. Educated on how to place sensor every 14 days, patient placed first sensor correctly and verbalized understanding of use, removal, and how to place next sensor. Discussed alarms. 6 sensors provided for a 3 month supply. Educated to contact the office if the sensor falls off early and replacements are needed before their next [employer].   Diabetes longstanding currently uncontrolled. Patient is not symptomatic at this time but is able to verbalize appropriate hypoglycemia management plan. Medication adherence appears to be okay, however, he has struggled with adherence in the past. We will have him return in 1 month for AGP report review before making changes. -Continued current regimen. -Patient educated on purpose, proper use, and potential adverse effects of glipizide , pioglitazone .  -Extensively discussed pathophysiology of diabetes, recommended lifestyle interventions, dietary effects on blood sugar control.  -Counseled on s/sx of and management of hypoglycemia.  -Next A1c anticipated 06/2024.   Written patient instructions provided. Patient verbalized understanding of treatment plan.  Total time in face to face counseling 30 minutes.    Follow-up:  Pharmacist in 1 month.  Herlene Fleeta Morris, PharmD, JAQUELINE, CPP Clinical Pharmacist [facility] [phone]

## 2024-03-20 NOTE — Telephone Encounter (Signed)
LIBERATE Study  Received referral for patient participation in the LIBERATE CGM Study. Contacted patient to discuss study and confirmed HIPAA identifiers. Confirmed patient was provided the LIBERATE Study Information Sheet and any questions were answered.   Confirmed that patient meets study criteria by having a diagnosis of Type 2 Diabetes, is not currently on insulin, and most recent A1c is >8%.  Patient provided verbal consent to participate in the study. Consent documented in electronic medical record.   - Confirmed that patient has a compatible smart phone to download [place] 3 app. ([email or web address]) - Asked to download and create a Josephine Igo account prior to first study visit.  - Scheduled first study visit for today. Confirmed patient has transportation to this appointment.  - Discussed use of MyChart in this study. Confirmed patient has an active MyChart account and is aware of their log in information.  Patient aware of pre-visit questionnaire that will be sent 2 days prior to their scheduled study visit and they will plan to complete before the visit.   Butch Penny, PharmD, P[place]y [place], CPP Clinical Pharmacist [facility] & [facility] [phone]

## 2024-03-27 ENCOUNTER — Telehealth: Payer: Self-pay | Admitting: Pharmacist

## 2024-03-27 NOTE — Telephone Encounter (Signed)
 LIBERATE Study  Patient completed first study visit for the LIBERATE CGM Study. Contacted patient to discuss CGM tolerability. Patient was unavailable and his voicemail was full. I have listed his AGP report results from the past week.   Date of Download: 03/27/24, 7-day report % Time CGM is active: 94% Average Glucose: 188 mg/dL Glucose Management Indicator: 7.8  Glucose Variability: 21.4 (goal <36%) Time in Goal:  - Time in range 70-180: 44% - Time above range: 56% - Time below range: 0%  - Visit with me scheduled for 05/08/2024.  Herlene Fleeta Morris, PharmD, JAQUELINE, CPP Clinical Pharmacist [facility] & [facility] [phone]

## 2024-04-21 ENCOUNTER — Telehealth: Payer: Self-pay

## 2024-04-21 NOTE — Telephone Encounter (Signed)
 Patient was identified as falling into the [place] Measure - Diabetes.   Patient was: Appointment scheduled for lab or office visit for A1c.

## 2024-04-23 NOTE — Telephone Encounter (Signed)
 error

## 2024-05-08 ENCOUNTER — Ambulatory Visit: Admitting: Pharmacist

## 2024-05-08 ENCOUNTER — Other Ambulatory Visit (HOSPITAL_BASED_OUTPATIENT_CLINIC_OR_DEPARTMENT_OTHER): Payer: Self-pay | Admitting: Pharmacist

## 2024-05-08 ENCOUNTER — Encounter: Payer: Self-pay | Admitting: Pharmacist

## 2024-05-08 DIAGNOSIS — I10 Essential (primary) hypertension: Secondary | ICD-10-CM

## 2024-05-08 NOTE — Progress Notes (Signed)
 Pharmacy Quality Measure Review  This patient is appearing on a report for being at risk of failing the adherence measure for hypertension (ACEi/ARB) medications this calendar year.   Medication: valsartan -HCTZ Last fill date: 01/02/2024 for 90 day supply  Contacted pharmacy to facilitate refills. They are filling and will be ready after 2pm on 05/09/24. Left HIPAA-compliant VM with patient encouraging him to pick-up.   Herlene Fleeta Morris, PharmD, JAQUELINE, CPP Clinical Pharmacist [facility] & [facility] [phone]

## 2024-05-14 ENCOUNTER — Other Ambulatory Visit: Payer: Self-pay | Admitting: Pharmacist

## 2024-05-14 NOTE — Progress Notes (Signed)
 Pharmacy Quality Measure Review  This patient is appearing on a report for being at risk of failing the adherence measure for hypertension (ACEi/ARB) medications this calendar year.   Medication: valsartan -HCTZ Last fill date: 05/13/2024 for 90 day supply  Contacted pharmacy to facilitate refills. They informed me Mr. Uher filled this medication and picked up a 90-day supply 05/13/2024.  Herlene Fleeta Morris, PharmD, JAQUELINE, CPP Clinical Pharmacist [facility] & [facility] [phone]

## 2024-06-03 ENCOUNTER — Encounter: Payer: Self-pay | Admitting: Family Medicine

## 2024-06-03 ENCOUNTER — Ambulatory Visit: Attending: Family Medicine | Admitting: Family Medicine

## 2024-06-03 VITALS — BP 144/82 | HR 71 | Temp 98.1°F | Ht 76.0 in | Wt 315.0 lb

## 2024-06-03 DIAGNOSIS — I10 Essential (primary) hypertension: Secondary | ICD-10-CM | POA: Diagnosis not present

## 2024-06-03 DIAGNOSIS — Z7984 Long term (current) use of oral hypoglycemic drugs: Secondary | ICD-10-CM | POA: Diagnosis not present

## 2024-06-03 DIAGNOSIS — I152 Hypertension secondary to endocrine disorders: Secondary | ICD-10-CM

## 2024-06-03 DIAGNOSIS — Z125 Encounter for screening for malignant neoplasm of prostate: Secondary | ICD-10-CM

## 2024-06-03 DIAGNOSIS — E1149 Type 2 diabetes mellitus with other diabetic neurological complication: Secondary | ICD-10-CM | POA: Diagnosis not present

## 2024-06-03 DIAGNOSIS — E1169 Type 2 diabetes mellitus with other specified complication: Secondary | ICD-10-CM

## 2024-06-03 LAB — POCT GLYCOSYLATED HEMOGLOBIN (HGB A1C): HbA1c, POC (controlled diabetic range): 7.5 % — AB (ref 0.0–7.0)

## 2024-06-03 MED ORDER — VALSARTAN-HYDROCHLOROTHIAZIDE 320-25 MG PO TABS
1.0000 | ORAL_TABLET | Freq: Every day | ORAL | 1 refills | Status: AC
Start: 2024-06-03 — End: ?

## 2024-06-03 MED ORDER — ATORVASTATIN CALCIUM 40 MG PO TABS
40.0000 mg | ORAL_TABLET | Freq: Every day | ORAL | 1 refills | Status: AC
Start: 2024-06-03 — End: ?

## 2024-06-03 MED ORDER — GLIPIZIDE 10 MG PO TABS
ORAL_TABLET | ORAL | 1 refills | Status: AC
Start: 2024-06-03 — End: ?

## 2024-06-03 NOTE — Progress Notes (Signed)
 Subjective:  Patient ID: Anthony Barnes, male    DOB: 1955-09-04  Age: 68 y.o. MRN: [id number]  CC: Medical Management of Chronic Issues     Discussed the use of AI scribe software for clinical note transcription with the patient, who gave verbal consent to proceed.  History of Present Illness Anthony Barnes is a 68 year old male with a history of hypertension, type 2 diabetes mellitus A1c , morbid obesity  who presents for a follow-up visit.  His A1c is 7.5% improved from 8.7 previously. He has not used his glucose monitor since it was damaged and has not replaced the sensor. He takes Actos  and glipizide  and picks up his medications regularly. He has no numbness, tingling, or loss of sensation in his feet. He has no foot pain.  For hypertension, he takes amlodipine , clonidine  (0.3 mg at night), carvedilol  (25 mg twice a day), and valsartan /hydrochlorothiazide  (once a day). He experienced excessive drowsiness with twice-daily clonidine , so he takes it only at night.      Past Medical History:  Diagnosis Date   Diabetes mellitus without complication (HCC)    Hypertension     Past Surgical History:  Procedure Laterality Date   BRAIN SURGERY     ESOPHAGOGASTRODUODENOSCOPY N/A 03/22/2013   Procedure: ESOPHAGOGASTRODUODENOSCOPY (EGD);  Surgeon: Lesta JULIANNA Fitz, MD;  Location: [facility] ENDOSCOPY;  Service: Endoscopy;  Laterality: N/A;    Family History  Problem Relation Age of Onset   Hypertension Mother    Cancer Mother 23       ovarian   Diabetes Father    Stroke Father     Social History   Socioeconomic History   Marital status: Married    Spouse name: Not on file   Number of children: Not on file   Years of education: Not on file   Highest education level: Not on file  Occupational History   Not on file  Tobacco Use   Smoking status: Former    Current packs/day: 0.00    Average packs/day: 1 pack/day for 16.0 years (16.0 ttl pk-yrs)    Types: Cigarettes    Start  date: 08/14/1974    Quit date: 08/14/1990    Years since quitting: 33.8   Smokeless tobacco: Former    Quit date: 08/14/1990  Vaping Use   Vaping status: Never Used  Substance and Sexual Activity   Alcohol use: Not Currently    Comment: weekends only   Drug use: No   Sexual activity: Not on file  Other Topics Concern   Not on file  Social History Narrative   Not on file   Social Drivers of Health   Financial Resource Strain: High Risk (08/21/2023)   Overall Financial Resource Strain (CARDIA)    Difficulty of Paying Living Expenses: Hard  Food Insecurity: No Food Insecurity (08/21/2023)   Hunger Vital Sign    Worried About Running Out of Food in the Last Year: Never true    Ran Out of Food in the Last Year: Never true  Transportation Needs: No Transportation Needs (08/21/2023)   PRAPARE - Administrator, Civil Service (Medical): No    Lack of Transportation (Non-Medical): No  Physical Activity: Insufficiently Active (08/21/2023)   Exercise Vital Sign    Days of Exercise per Week: 3 days    Minutes of Exercise per Session: 20 min  Stress: No Stress Concern Present (08/21/2023)   [employer] of Occupational Health - Occupational Stress Questionnaire  Feeling of Stress : Not at all  Social Connections: Socially Integrated (08/21/2023)   Social Connection and Isolation Panel    Frequency of Communication with Friends and Family: More than three times a week    Frequency of Social Gatherings with Friends and Family: More than three times a week    Attends Religious Services: More than 4 times per year    Active Member of [employer] or Organizations: Yes    Attends Engineer, structural: More than 4 times per year    Marital Status: Married    Allergies  Allergen Reactions   Shellfish Allergy Anaphylaxis   Codeine  Itching    unknown    Outpatient Medications Prior to Visit  Medication Sig Dispense Refill   amLODipine  (NORVASC ) 10 MG tablet TAKE ONE TABLET BY MOUTH  ONCE A DAY 90 tablet 1   Blood Glucose Monitoring Suppl (TRUE METRIX METER) w/Device KIT Check blood sugar 3 times daily. 1 kit 0   carvedilol  (COREG ) 25 MG tablet Take 1 tablet (25 mg total) by mouth 2 (two) times daily with a meal. 180 tablet 1   cetirizine  (ZYRTEC  ALLERGY) 10 MG tablet Take 1 tablet (10 mg total) by mouth daily. 7 tablet 0   cloNIDine  (CATAPRES ) 0.3 MG tablet TAKE ONE TABLET BY MOUTH TWICE DAILY 180 tablet 0   gabapentin  (NEURONTIN ) 300 MG capsule Take 1 capsule (300 mg total) by mouth at bedtime. 90 capsule 1   ibuprofen  (ADVIL ,MOTRIN ) 800 MG tablet Take 1 tablet (800 mg total) by mouth every 12 (twelve) hours as needed. 30 tablet 0   Multiple Vitamin (MULTIVITAMIN WITH MINERALS) TABS tablet Take 1 tablet by mouth daily.     Olopatadine  HCl 0.2 % SOLN Apply 1 drop to eye daily. 2.5 mL 0   omega-3 acid ethyl esters (LOVAZA) 1 g capsule Take 1 g by mouth 2 (two) times daily.     pioglitazone  (ACTOS ) 15 MG tablet Take 1 tablet (15 mg total) by mouth daily. 90 tablet 1   TRUEplus Lancets 28G MISC CHECK BLOOD SUGAR 3 TIMES DAILY. 100 each 2   atorvastatin  (LIPITOR) 40 MG tablet Take 1 tablet (40 mg total) by mouth daily. 90 tablet 1   glipiZIDE  (GLUCOTROL ) 10 MG tablet TAKE ONE TABLET BY MOUTH TWICE DAILY BEFORE MEALS 180 tablet 1   valsartan -hydrochlorothiazide  (DIOVAN -HCT) 320-25 MG tablet Take 1 tablet by mouth daily. 90 tablet 1   No facility-administered medications prior to visit.     ROS Review of Systems  Constitutional:  Negative for activity change and appetite change.  HENT:  Negative for sinus pressure and sore throat.   Respiratory:  Negative for chest tightness, shortness of breath and wheezing.   Cardiovascular:  Negative for chest pain and palpitations.  Gastrointestinal:  Negative for abdominal distention, abdominal pain and constipation.  Genitourinary: Negative.   Musculoskeletal: Negative.   Psychiatric/Behavioral:  Negative for behavioral problems and  dysphoric mood.     Objective:  BP (!) 144/82   Pulse 71   Temp 98.1 F (36.7 C) (Oral)   Ht 6' 4 (1.93 m)   Wt (!) 315 lb (142.9 kg)   SpO2 98%   BMI 38.34 kg/m      06/03/2024    9:59 AM 06/03/2024    9:28 AM 03/03/2024    4:28 PM  BP/Weight  Systolic BP 144 162 163  Diastolic BP 82 90 92  Wt. (Lbs)  315   BMI  38.34 kg/m2  Physical Exam Constitutional:      Appearance: He is well-developed. He is obese.  Cardiovascular:     Rate and Rhythm: Normal rate.     Heart sounds: Normal heart sounds. No murmur heard. Pulmonary:     Effort: Pulmonary effort is normal.     Breath sounds: Normal breath sounds. No wheezing or rales.  Chest:     Chest wall: No tenderness.  Abdominal:     General: Bowel sounds are normal. There is no distension.     Palpations: Abdomen is soft. There is no mass.     Tenderness: There is no abdominal tenderness.  Musculoskeletal:        General: Normal range of motion.     Right lower leg: No edema.     Left lower leg: No edema.  Neurological:     Mental Status: He is alert and oriented to person, place, and time.  Psychiatric:        Mood and Affect: Mood normal.        Latest Ref Rng & Units 03/03/2024    4:43 PM 08/21/2023    4:41 PM 10/17/2022    4:43 PM  CMP  Glucose 70 - 99 mg/dL 799  818  843   BUN 8 - 27 mg/dL 16  20  22    Creatinine 0.76 - 1.27 mg/dL 8.95  8.94  9.01   Sodium 134 - 144 mmol/L 142  137  138   Potassium 3.5 - 5.2 mmol/L 4.4  4.3  4.1   Chloride 96 - 106 mmol/L 103  99  100   CO2 20 - 29 mmol/L 25  24  22    Calcium  8.6 - 10.2 mg/dL 89.9  89.9  9.9   Total Protein 6.0 - 8.5 g/dL 7.6   7.9   Total Bilirubin 0.0 - 1.2 mg/dL 0.3   0.3   Alkaline Phos 44 - 121 IU/L 55   51   AST 0 - 40 IU/L 16   20   ALT 0 - 44 IU/L 20   26     Lipid Panel     Component Value Date/Time   CHOL 156 07/20/2020 0903   TRIG 48 07/20/2020 0903   HDL 49 07/20/2020 0903   CHOLHDL 3.2 07/20/2020 0903   CHOLHDL 3.2  07/21/2016 1051   VLDL 10 07/21/2016 1051   LDLCALC 97 07/20/2020 0903    CBC    Component Value Date/Time   WBC 8.1 11/25/2017 0407   RBC 4.82 11/25/2017 0407   HGB 13.5 11/25/2017 0407   HCT 39.1 11/25/2017 0407   PLT 211 11/25/2017 0407   MCV 81.1 11/25/2017 0407   MCH 28.0 11/25/2017 0407   MCHC 34.5 11/25/2017 0407   RDW 15.4 11/25/2017 0407   LYMPHSABS 1.8 10/20/2013 1250   MONOABS 0.7 10/20/2013 1250   EOSABS 0.1 10/20/2013 1250   BASOSABS 0.0 10/20/2013 1250    Lab Results  Component Value Date   HGBA1C 7.5 (A) 06/03/2024    Lab Results  Component Value Date   HGBA1C 7.5 (A) 06/03/2024   HGBA1C 8.7 (A) 03/20/2024   HGBA1C 8.7 (A) 03/03/2024       Assessment & Plan 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 No recent neuropathy symptoms. Foot exam normal except for a small corn. - Continue Actos  and glipizide . - Reapply glucose monitor on arm. - Monitor corn for changes. - Perform regular foot exams. -Counseled on Diabetic diet,  the healthy plate, 849 minutes of moderate intensity exercise/week Blood sugar logs with fasting goals of 80-120 mg/dl, random of less than 819 and in the event of sugars less than 60 mg/dl or greater than 599 mg/dl encouraged to notify the clinic. Advised on the need for annual eye exams, annual foot exams, Pneumonia vaccine.   Hypertension Blood pressure initially elevated but improved to 144/82 mmHg upon repeat - Will make no regimen changes today - Continue amlodipine , clonidine , carvedilol , and valsartan -hydrochlorothiazide . - Encourage low sodium diet, regular exercise, and weight loss. - Recheck blood pressure in three months.  General Health Maintenance/screening for prostate cancer Due for cholesterol check and prostate screening. Declined pneumonia vaccine. - Perform cholesterol check today. - Add prostate screening to today's blood tests.      Meds ordered this encounter   Medications   atorvastatin  (LIPITOR) 40 MG tablet    Sig: Take 1 tablet (40 mg total) by mouth daily.    Dispense:  90 tablet    Refill:  1   glipiZIDE  (GLUCOTROL ) 10 MG tablet    Sig: TAKE ONE TABLET BY MOUTH TWICE DAILY BEFORE MEALS    Dispense:  180 tablet    Refill:  1   valsartan -hydrochlorothiazide  (DIOVAN -HCT) 320-25 MG tablet    Sig: Take 1 tablet by mouth daily.    Dispense:  90 tablet    Refill:  1    Discontinue lisinopril /HCTZ    Follow-up: Return in about 3 months (around 09/03/2024) for Chronic medical conditions.       Corrina Sabin, MD, FAAFP. [facility] [place], [place] [phone]   06/03/2024, 1:11 PM

## 2024-06-03 NOTE — Patient Instructions (Signed)
 VISIT SUMMARY:  You came in today for a follow-up visit to manage your diabetes and hypertension. Your A1c level has improved to 7.5%, and your blood pressure is now 144/82 mmHg. We discussed your current medications and made some recommendations for monitoring and lifestyle changes.  YOUR PLAN:  -TYPE 2 DIABETES MELLITUS WITH NEUROLOGICAL COMPLICATION: Type 2 diabetes is a condition where your body does not use insulin  properly, leading to high blood sugar levels. Your A1c has improved to 7.5%, and you have no recent symptoms of neuropathy. Continue taking Actos  and glipizide , reapply the glucose monitor on your arm, and monitor the small corn on your foot for any changes. Regular foot exams are also recommended.  -HYPERTENSION: Hypertension, or high blood pressure, is a condition where the force of the blood against your artery walls is too high. Your blood pressure has improved to 144/82 mmHg. Continue taking amlodipine , clonidine , carvedilol , and valsartan -hydrochlorothiazide . We recommend a low sodium diet, regular exercise, and weight loss. Recheck your blood pressure in three months.  -GENERAL HEALTH MAINTENANCE: You are due for a cholesterol check and prostate screening. We will perform the cholesterol check and add the prostate screening to today's blood tests. You declined the pneumonia vaccine.  INSTRUCTIONS:  Reapply the glucose monitor on your arm and monitor the small corn on your foot for any changes. Perform regular foot exams. Recheck your blood pressure in three months. We will perform a cholesterol check and prostate screening today.

## 2024-06-04 ENCOUNTER — Other Ambulatory Visit: Payer: Self-pay

## 2024-06-04 LAB — PSA, TOTAL AND FREE
PSA, Free Pct: 30 %
PSA, Free: 0.24 ng/mL
Prostate Specific Ag, Serum: 0.8 ng/mL (ref 0.0–4.0)

## 2024-06-04 LAB — LP+NON-HDL CHOLESTEROL
Cholesterol, Total: 145 mg/dL (ref 100–199)
HDL: 54 mg/dL (ref >39)
LDL Chol Calc (NIH): 82 mg/dL (ref 0–99)
Total Non-HDL-Chol (LDL+VLDL): 91 mg/dL (ref 0–129)
Triglycerides: 38 mg/dL (ref 0–149)
VLDL Cholesterol Cal: 9 mg/dL (ref 5–40)

## 2024-06-04 NOTE — Progress Notes (Signed)
 Contacted patient to discuss potential medication access barriers related to use of non-preferred pharmacy. Discussed health plan preferred pharmacies.  Patient stated that he would discuss options further at upcoming appointment with embedded pharmacist.  Jenkins Graces, PharmD PGY1 Pharmacy Resident [phone]

## 2024-06-05 ENCOUNTER — Ambulatory Visit: Payer: Self-pay | Admitting: Family Medicine

## 2024-06-12 ENCOUNTER — Ambulatory Visit: Attending: Family Medicine | Admitting: Pharmacist

## 2024-06-12 ENCOUNTER — Encounter: Payer: Self-pay | Admitting: Pharmacist

## 2024-06-12 DIAGNOSIS — E1149 Type 2 diabetes mellitus with other diabetic neurological complication: Secondary | ICD-10-CM

## 2024-06-12 DIAGNOSIS — Z7984 Long term (current) use of oral hypoglycemic drugs: Secondary | ICD-10-CM

## 2024-06-12 NOTE — Progress Notes (Signed)
    S:     No chief complaint on file.  68 y.o. male who presents for diabetes evaluation, education, and management. Of note, we also have him enrolled in our LIBERATE study.   Patient was referred and last seen by Primary Care Provider, Dr. Delbert, on 06/03/2024. At that visit, A1c was 7.5% (down from 8.7%). He is due for his LIBERATE #3 month follow-up in November.    PMH is significant for HTN, T2DM, GERD. Patient reports Diabetes is longstanding.   Family/Social History:  -Fhx: HTN, DM, stroke -Tobacco: former smoker, quit in 1992 -Alcohol: none reported   Current diabetes medications include: glipizide  10 mg BID, pioglitazone  15 mg daily Patient reports adherence to taking all medications as prescribed. Has both of his DM medications with him today.   Insurance coverage: [employer]   Patient denies hypoglycemic events.  Patient denies nocturia (nighttime urination).  Patient denies neuropathy (nerve pain). Patient denies visual changes. Patient reports self foot exams.   Patient reported dietary habits:  -Admits to struggling with dietary indiscretion  Patient-reported exercise habits: none reported   O:  Has struggled with sensors. Only has ~19% activity over the last 90 days.  Lab Results  Component Value Date   HGBA1C 7.5 (A) 06/03/2024   There were no vitals filed for this visit.  Lipid Panel     Component Value Date/Time   CHOL 145 06/03/2024 1021   TRIG 38 06/03/2024 1021   HDL 54 06/03/2024 1021   CHOLHDL 3.2 07/20/2020 0903   CHOLHDL 3.2 07/21/2016 1051   VLDL 10 07/21/2016 1051   LDLCALC 82 06/03/2024 1021    Clinical Atherosclerotic Cardiovascular Disease (ASCVD): No  The 10-year ASCVD risk score (Arnett DK, et al., 2019) is: 32%   Values used to calculate the score:     Age: 72 years     Clincally relevant sex: Male     Is Non-Hispanic African American: Yes     Diabetic: Yes     Tobacco smoker: No     Systolic Blood Pressure:  144 mmHg     Is BP treated: Yes     HDL Cholesterol: 54 mg/dL     Total Cholesterol: 145 mg/dL   Patient is participating in a Managed Medicaid Plan: No   A/P Diabetes longstanding currently above goal but improved. Patient is not symptomatic at this time but is able to verbalize appropriate hypoglycemia management plan. Medication adherence appears to be okay, however, he has struggled with adherence in the past. We will have him return in 1 month for Liberate 3 month follow-up. -Continued current regimen. -Patient educated on purpose, proper use, and potential adverse effects of glipizide , pioglitazone .  -Extensively discussed pathophysiology of diabetes, recommended lifestyle interventions, dietary effects on blood sugar control.  -Counseled on s/sx of and management of hypoglycemia.  -Next A1c anticipated 06/2024.   Written patient instructions provided. Patient verbalized understanding of treatment plan.  Total time in face to face counseling 30 minutes.    Follow-up:  Pharmacist in 1 month.  Herlene Fleeta Morris, PharmD, JAQUELINE, CPP Clinical Pharmacist [facility] & [facility] [phone]

## 2024-06-17 ENCOUNTER — Other Ambulatory Visit: Payer: Self-pay | Admitting: Family Medicine

## 2024-06-17 DIAGNOSIS — I152 Hypertension secondary to endocrine disorders: Secondary | ICD-10-CM

## 2024-07-08 ENCOUNTER — Ambulatory Visit: Attending: Family Medicine | Admitting: Pharmacist

## 2024-07-08 ENCOUNTER — Encounter: Payer: Self-pay | Admitting: Pharmacist

## 2024-07-08 VITALS — BP 139/80 | HR 69

## 2024-07-08 DIAGNOSIS — Z7984 Long term (current) use of oral hypoglycemic drugs: Secondary | ICD-10-CM

## 2024-07-08 DIAGNOSIS — E1149 Type 2 diabetes mellitus with other diabetic neurological complication: Secondary | ICD-10-CM

## 2024-07-08 DIAGNOSIS — I10 Essential (primary) hypertension: Secondary | ICD-10-CM

## 2024-07-08 LAB — POCT GLYCOSYLATED HEMOGLOBIN (HGB A1C): HbA1c, POC (controlled diabetic range): 7.4 % — AB (ref 0.0–7.0)

## 2024-07-08 NOTE — Progress Notes (Signed)
 S:     No chief complaint on file.  68 y.o. male who presents for diabetes evaluation, education, and management in the context of the LIBERATE Study. Today, patient arrives in good spirits and presents without any assistance. This is his official 3 month follow-up.  Patient was referred and last seen by Primary Care Provider, Dr. Delbert, on 06/03/2024.   PMH is significant for HTN, T2DM, GERD. Patient reports Diabetes is longstanding.   Today, he is doing well. Denies any side effects to his medications. Has no complaints today.  Family/Social History:  -Fhx: HTN, DM, stroke -Tobacco: former smoker, quit in 1992 -Alcohol: none reported   Current diabetes medications include: glipizide  10 mg BID, pioglitazone  15 mg daily Current hypertension medications include: amlodipine  10 mg daily, carvedilol  25 mg BID, clonidine  0.3 mg BID, valsartan -hydrochlorothiazide  320-25 mg Current hyperlipidemia medications include: atorvastatin  40 mg daily  Patient reports adherence to taking all medications as prescribed.   Insurance coverage: HTA  Patient denies hypoglycemic events.  CGM Study Study visit: 3 Month Follow Up Visit  CGM Data Download date: 07/08/24, 28-day report % Time CGM Is Active: 43 % Average glucose (mg/dL): 854 mg/dL Glucose Management Indicator (%): 6.8 % Glucose Variability (%): 23.3 % Time Above Range >180 mg/dL (%): 16 % Time in Range 70-180 mg/dL (%): 81 % Time Below Range <70 mg/dL (%): 0 %  Diabetes Distress Scale Feeling like diabetes is taking up too much of my mental and physical energy every day.: A slight problem Feeling that my doctor doesn't know enough about diabetes and diabetic care. : Not a problem Feeling angry, scared, and/or depressed when I think about living with diabetes : Not a problem Feeling that my doctor doesn't give my clear directions on how to manage my diabetes. : Not a problem Feeling that im not testing my blood sugars  frequently enough.: Not a problem Feeling that I'm often failing with my diabetes routine: Not a problem Feelling that friends and family are not supportive enough of self care efforts.: Not a problem Feeling that diabetes controls my life.: Not a problem Feeling that my doctor doesn't take my concerns seriously enough: Not a problem Not feeling confident in my day to day ability to manage diabetes: Not a problem Feeling that I will end up with serious long term complications no matter what I do.: Not a problem Feeling that I am not sticking closely enough to a good meal plan.: A slight problem Feeling that friends or family don't appreciate how difficult living with diabetes can be. : Not a problem Feeling overwhelmed by the demands of living with diabetes.: Not a problem Feeling that I don't have a doctor who I can see.: Not a problem Not feeling motivated to keep up my diabetes self management.: Not a problem Feeling that friends or family don't give me the emotional support that I would like. : Not a problem DDS17 Score: 19 Emotional Burden Score: 1.2 Physician related distress score: 1 Regimen Related Distress score : 1.2 Interpersonal distress score: 1   Patient denies nocturia (nighttime urination).  Patient denies neuropathy (nerve pain). Patient denies visual changes. Patient reports self foot exams.   Patient reported dietary habits:  -Libre sensors are assisting him in managing his diet  Patient-reported exercise habits: none reported   O:  Lab Results  Component Value Date   HGBA1C 7.4 (A) 07/08/2024   Vitals:   07/08/24 1122  BP: 139/80  Pulse: 69  Lipid Panel     Component Value Date/Time   CHOL 145 06/03/2024 1021   TRIG 38 06/03/2024 1021   HDL 54 06/03/2024 1021   CHOLHDL 3.2 07/20/2020 0903   CHOLHDL 3.2 07/21/2016 1051   VLDL 10 07/21/2016 1051   LDLCALC 82 06/03/2024 1021    Clinical Atherosclerotic Cardiovascular Disease (ASCVD): No  The  10-year ASCVD risk score (Arnett DK, et al., 2019) is: 30.3%   Values used to calculate the score:     Age: 5 years     Clincally relevant sex: Male     Is Non-Hispanic African American: Yes     Diabetic: Yes     Tobacco smoker: No     Systolic Blood Pressure: 139 mmHg     Is BP treated: Yes     HDL Cholesterol: 54 mg/dL     Total Cholesterol: 145 mg/dL   Patient is participating in a Managed Medicaid Plan: No    A/P:  LIBERATE Study:  - 6 sensors provided for a 3 month supply. Educated to contact the office if the sensor falls off early and replacements are needed before their next [employer].   Diabetes longstanding currently close to goal with A1c of 7.4 today. GMI over the last 7 days is at goal. TIR over the last 7 days is at goal. Patient is not currently symptomatic but is able to verbalize appropriate hypoglycemia management plan. Medication adherence appears to be appropriate. -Continued current regimen. -Extensively discussed pathophysiology of diabetes, recommended lifestyle interventions, dietary effects on blood sugar control.  -Counseled on s/sx of and management of hypoglycemia.  -Next A1c anticipated 09/2024.   Hypertension longstanding currently above goal but improved. Blood pressure goal of <130/80 mmHg. Medication adherence reported.  -Continue current regimen.  Written patient instructions provided. Patient verbalized understanding of treatment plan.  Total time in face to face counseling 30 minutes.    Follow-up:  Pharmacist in 4-6 weeks.  Herlene Fleeta Morris, PharmD, JAQUELINE, CPP Clinical Pharmacist [facility] [phone]

## 2024-08-11 ENCOUNTER — Other Ambulatory Visit: Payer: Self-pay | Admitting: Pharmacist

## 2024-08-11 NOTE — Progress Notes (Signed)
 Pharmacy Quality Measure Review  This patient is appearing on a report for being at risk of failing the adherence measure for hypertension (ACEi/ARB) medications this calendar year.   Medication: valsartan -HCTZ Last fill date: 05/13/2024 for 90 day supply  Contacted pharmacy to facilitate refills. They informed me Mr. Uher filled this medication and picked up a 90-day supply 05/13/2024.  Herlene Fleeta Morris, PharmD, JAQUELINE, CPP Clinical Pharmacist [facility] & [facility] [phone]

## 2024-08-12 ENCOUNTER — Other Ambulatory Visit: Payer: Self-pay | Admitting: Family Medicine

## 2024-08-12 DIAGNOSIS — I152 Hypertension secondary to endocrine disorders: Secondary | ICD-10-CM

## 2024-08-19 ENCOUNTER — Encounter: Payer: Self-pay | Admitting: Pharmacist

## 2024-08-19 ENCOUNTER — Ambulatory Visit: Attending: Family Medicine | Admitting: Pharmacist

## 2024-08-19 DIAGNOSIS — Z7984 Long term (current) use of oral hypoglycemic drugs: Secondary | ICD-10-CM | POA: Diagnosis not present

## 2024-08-19 DIAGNOSIS — E1149 Type 2 diabetes mellitus with other diabetic neurological complication: Secondary | ICD-10-CM

## 2024-08-19 NOTE — Progress Notes (Signed)
 "   S:     No chief complaint on file.  69 y.o. male who presents for diabetes evaluation, education, and management in the context of the LIBERATE Study. Today, patient arrives in good spirits and presents without any assistance. This is his official 3 month follow-up.  Patient was referred and last seen by Primary Care Provider, Dr. Delbert, on 06/03/2024.   PMH is significant for HTN, T2DM, GERD. Patient reports Diabetes is longstanding.   Today, he is doing well. Denies any side effects to his medications. Has no complaints today.  Family/Social History:  -Fhx: HTN, DM, stroke -Tobacco: former smoker, quit in 1992 -Alcohol: none reported   Current diabetes medications include: glipizide  10 mg BID, pioglitazone  15 mg daily Current hypertension medications include: amlodipine  10 mg daily, carvedilol  25 mg BID, clonidine  0.3 mg BID, valsartan -hydrochlorothiazide  320-25 mg Current hyperlipidemia medications include: atorvastatin  40 mg daily  Patient reports adherence to taking all medications as prescribed.   Insurance coverage: HTA  Patient denies hypoglycemic events.  Date of Download: 08/19/24, 30-day report from 11/11 - 07/20/24. % Time CGM is active: 80% Average Glucose: 145 mg/dL Glucose Management Indicator: 6.8  Glucose Variability: 24.5 (goal <36%) Time in Goal:  - Time in range 70-180: 84% - Time above range: 16% - Time below range: 0%  Patient denies nocturia (nighttime urination).  Patient denies neuropathy (nerve pain). Patient denies visual changes. Patient reports self foot exams.   Patient reported dietary habits:  -Libre sensors are assisting him in managing his diet  Patient-reported exercise habits: none reported   O:  Lab Results  Component Value Date   HGBA1C 7.4 (A) 07/08/2024   There were no vitals filed for this visit.  Lipid Panel     Component Value Date/Time   CHOL 145 06/03/2024 1021   TRIG 38 06/03/2024 1021   HDL 54 06/03/2024  1021   CHOLHDL 3.2 07/20/2020 0903   CHOLHDL 3.2 07/21/2016 1051   VLDL 10 07/21/2016 1051   LDLCALC 82 06/03/2024 1021    Clinical Atherosclerotic Cardiovascular Disease (ASCVD): No  The 10-year ASCVD risk score (Arnett DK, et al., 2019) is: 30.3%   Values used to calculate the score:     Age: 37 years     Clinically relevant sex: Male     Is Non-Hispanic African American: Yes     Diabetic: Yes     Tobacco smoker: No     Systolic Blood Pressure: 139 mmHg     Is BP treated: Yes     HDL Cholesterol: 54 mg/dL     Total Cholesterol: 145 mg/dL   Patient is participating in a Managed Medicaid Plan: No    A/P:  LIBERATE Study:  Important Update about Your [employer]. The manufacturer of the [employer] Hissop 3 sensor has identified an issue with some sensors. These affected sensors may give incorrect low glucose readings. Provided pt with 2 new sensors. No reports of any physical harm.   Diabetes longstanding currently close to goal with A1c of 7.4 in November. GMI over 30 days between 11/10 - 07/20/24 is at goal. TIR at goal. Patient is not currently symptomatic but is able to verbalize appropriate hypoglycemia management plan. Medication adherence appears to be appropriate. -Continued current regimen. -Extensively discussed pathophysiology of diabetes, recommended lifestyle interventions, dietary effects on blood sugar control.  -Counseled on s/sx of and management of hypoglycemia.  -Next A1c anticipated 09/2024.   Written patient instructions provided. Patient verbalized understanding of treatment  plan.  Total time in face to face counseling 30 minutes.    Follow-up:  Pharmacist in 4-6 weeks for LIBERATE #3.  Herlene Fleeta Morris, PharmD, JAQUELINE, CPP Clinical Pharmacist [facility] [phone]   "

## 2024-09-03 ENCOUNTER — Ambulatory Visit: Admitting: Family Medicine

## 2024-09-15 ENCOUNTER — Other Ambulatory Visit: Payer: Self-pay | Admitting: Family Medicine

## 2024-09-15 DIAGNOSIS — I152 Hypertension secondary to endocrine disorders: Secondary | ICD-10-CM

## 2024-09-15 DIAGNOSIS — E1149 Type 2 diabetes mellitus with other diabetic neurological complication: Secondary | ICD-10-CM

## 2024-09-15 DIAGNOSIS — Z7984 Long term (current) use of oral hypoglycemic drugs: Secondary | ICD-10-CM

## 2024-10-06 ENCOUNTER — Ambulatory Visit: Payer: Self-pay | Admitting: Pharmacist

## 2024-10-20 ENCOUNTER — Ambulatory Visit: Admitting: Family Medicine
